# Patient Record
Sex: Male | Born: 1945 | Race: White | Hispanic: No | Marital: Married | Smoking: Former smoker
Health system: Southern US, Community
[De-identification: ages and names within clinical notes are randomized; demographics above are authoritative.]

## PROBLEM LIST (undated history)

## (undated) DIAGNOSIS — M1990 Unspecified osteoarthritis, unspecified site: Secondary | ICD-10-CM

## (undated) DIAGNOSIS — Z86718 Personal history of other venous thrombosis and embolism: Secondary | ICD-10-CM

## (undated) DIAGNOSIS — I10 Essential (primary) hypertension: Secondary | ICD-10-CM

## (undated) DIAGNOSIS — Z923 Personal history of irradiation: Secondary | ICD-10-CM

## (undated) DIAGNOSIS — I714 Abdominal aortic aneurysm, without rupture, unspecified: Secondary | ICD-10-CM

## (undated) DIAGNOSIS — C3490 Malignant neoplasm of unspecified part of unspecified bronchus or lung: Secondary | ICD-10-CM

## (undated) DIAGNOSIS — D701 Agranulocytosis secondary to cancer chemotherapy: Secondary | ICD-10-CM

## (undated) DIAGNOSIS — T451X5A Adverse effect of antineoplastic and immunosuppressive drugs, initial encounter: Secondary | ICD-10-CM

## (undated) DIAGNOSIS — R5382 Chronic fatigue, unspecified: Secondary | ICD-10-CM

## (undated) DIAGNOSIS — Z5111 Encounter for antineoplastic chemotherapy: Secondary | ICD-10-CM

## (undated) DIAGNOSIS — E785 Hyperlipidemia, unspecified: Secondary | ICD-10-CM

## (undated) DIAGNOSIS — I2699 Other pulmonary embolism without acute cor pulmonale: Secondary | ICD-10-CM

## (undated) DIAGNOSIS — I313 Pericardial effusion (noninflammatory): Secondary | ICD-10-CM

## (undated) HISTORY — DX: Abdominal aortic aneurysm, without rupture: I71.4

## (undated) HISTORY — DX: Malignant neoplasm of unspecified part of unspecified bronchus or lung: C34.90

## (undated) HISTORY — DX: Hyperlipidemia, unspecified: E78.5

## (undated) HISTORY — DX: Personal history of irradiation: Z92.3

## (undated) HISTORY — PX: ANTERIOR FUSION CERVICAL SPINE: SUR626

## (undated) HISTORY — DX: Agranulocytosis secondary to cancer chemotherapy: D70.1

## (undated) HISTORY — PX: COLONOSCOPY: SHX174

## (undated) HISTORY — DX: Abdominal aortic aneurysm, without rupture, unspecified: I71.40

## (undated) HISTORY — DX: Adverse effect of antineoplastic and immunosuppressive drugs, initial encounter: T45.1X5A

## (undated) HISTORY — DX: Encounter for antineoplastic chemotherapy: Z51.11

## (undated) HISTORY — PX: JOINT REPLACEMENT: SHX530

## (undated) HISTORY — DX: Pericardial effusion (noninflammatory): I31.3

## (undated) HISTORY — DX: Chronic fatigue, unspecified: R53.82

## (undated) HISTORY — DX: Essential (primary) hypertension: I10

## (undated) SURGERY — Surgical Case
Anesthesia: *Unknown

---

## 1976-11-30 HISTORY — PX: APPENDECTOMY: SHX54

## 1990-11-30 HISTORY — PX: INGUINAL HERNIA REPAIR: SUR1180

## 2005-05-28 ENCOUNTER — Ambulatory Visit (HOSPITAL_COMMUNITY): Admission: RE | Admit: 2005-05-28 | Discharge: 2005-05-29 | Payer: Self-pay | Admitting: Neurological Surgery

## 2005-07-03 ENCOUNTER — Encounter: Admission: RE | Admit: 2005-07-03 | Discharge: 2005-07-03 | Payer: Self-pay | Admitting: Neurological Surgery

## 2008-12-31 ENCOUNTER — Emergency Department (HOSPITAL_COMMUNITY): Admission: EM | Admit: 2008-12-31 | Discharge: 2008-12-31 | Payer: Self-pay | Admitting: Emergency Medicine

## 2008-12-31 HISTORY — PX: HEMIARTHROPLASTY SHOULDER FRACTURE: SUR653

## 2009-01-07 ENCOUNTER — Ambulatory Visit (HOSPITAL_COMMUNITY): Admission: RE | Admit: 2009-01-07 | Discharge: 2009-01-08 | Payer: Self-pay | Admitting: Orthopedic Surgery

## 2011-03-17 LAB — TYPE AND SCREEN: ABO/RH(D): O POS

## 2011-03-17 LAB — URINALYSIS, ROUTINE W REFLEX MICROSCOPIC
Hgb urine dipstick: NEGATIVE
Ketones, ur: NEGATIVE mg/dL
Nitrite: NEGATIVE
Specific Gravity, Urine: 1.016 (ref 1.005–1.030)
Urobilinogen, UA: 1 mg/dL (ref 0.0–1.0)

## 2011-03-17 LAB — DIFFERENTIAL
Basophils Absolute: 0 10*3/uL (ref 0.0–0.1)
Basophils Relative: 0 % (ref 0–1)
Eosinophils Absolute: 0.1 10*3/uL (ref 0.0–0.7)
Neutro Abs: 7.2 10*3/uL (ref 1.7–7.7)
Neutrophils Relative %: 69 % (ref 43–77)

## 2011-03-17 LAB — PROTIME-INR: INR: 1 (ref 0.00–1.49)

## 2011-03-17 LAB — BASIC METABOLIC PANEL
BUN: 11 mg/dL (ref 6–23)
CO2: 28 mEq/L (ref 19–32)
Calcium: 8.7 mg/dL (ref 8.4–10.5)
Chloride: 94 mEq/L — ABNORMAL LOW (ref 96–112)
Creatinine, Ser: 0.97 mg/dL (ref 0.4–1.5)
GFR calc Af Amer: >60 mL/min (ref >60)
Glucose, Bld: 108 mg/dL — ABNORMAL HIGH (ref 70–99)
Glucose, Bld: 119 mg/dL — ABNORMAL HIGH (ref 70–99)
Potassium: 4.5 mEq/L (ref 3.5–5.1)
Sodium: 129 mEq/L — ABNORMAL LOW (ref 135–145)
Sodium: 131 mEq/L — ABNORMAL LOW (ref 135–145)

## 2011-03-17 LAB — CBC
Hemoglobin: 13.1 g/dL (ref 13.0–17.0)
MCV: 96 fL (ref 78.0–100.0)
RBC: 4.37 MIL/uL (ref 4.22–5.81)
RDW: 12.6 % (ref 11.5–15.5)
WBC: 10.5 10*3/uL (ref 4.0–10.5)
WBC: 12.6 10*3/uL — ABNORMAL HIGH (ref 4.0–10.5)

## 2011-03-17 LAB — ABO/RH: ABO/RH(D): O POS

## 2011-03-17 LAB — APTT: aPTT: 30 seconds (ref 24–37)

## 2011-04-14 NOTE — Op Note (Signed)
NAMEJAQUAVEON, BILAL NO.:  [id number]   MEDICAL RECORD NO.:  [id number]          PATIENT TYPE:  INP   LOCATION:  5004                         FACILITY:  [facility]   PHYSICIAN:  Almedia Balls. Ranell Patrick, M.D. DATE OF BIRTH:  1946-06-24   DATE OF PROCEDURE:  01/07/2009  DATE OF DISCHARGE:                               OPERATIVE REPORT   PREOPERATIVE DIAGNOSIS:  Left shoulder displaced three-part proximal  humerus fracture.   POSTOPERATIVE DIAGNOSIS:  Left shoulder displaced three-part proximal  humerus fracture.   PROCEDURE PERFORMED:  Left shoulder open reduction and internal fixation  of proximal humerus fracture utilizing DePuy nail plate (SNP).   SURGEON:  Almedia Balls. Ranell Patrick, MD   ASSISTANT:  Donnie Coffin. Dixon, PA-C   ANESTHESIA:  General anesthesia plus interscalene block anesthesia was  used.   ESTIMATED BLOOD LOSS:  Minimal.   FLUID REPLACEMENT:  1200 mL of crystalloid.   COUNTS:  Correct.   COMPLICATIONS:  None.   ANTIBIOTICS:  Perioperative antibiotics were given.   INDICATIONS:  The patient is a 65 year old male status post a fall off a  ladder while painting his house.  The patient complained of immediate  shoulder pain and inability to elevated his arm.  Radiographs  demonstrating a displaced three-part proximal humerus fracture with  significant malalignment of the femoral head and severe displacement of  the greater tuberosity.  Having counseled the patient regarding the need  operative reduction of the fractured shoulder, the patient consented to  surgery with a fall back plan being a shoulder hemiarthroplasty for more  comminution or instability of the proximal humerus ORIF.  Informed  consent was obtained.   DESCRIPTION OF PROCEDURE:  After an adequate level of anesthesia was  achieved, the patient was positioned in a modified beach-chair position.  Left shoulder was sterilely prepped and draped in the usual manner.  A  deltopectoral approach was  utilized to approach the fractured shoulder  starting at  the coracoid process and extending down to the anterior  humeral shaft.  Dissection down through subcutaneous tissues using  Bovie.  The deltoid was taken laterally with cephalic vein.  The  pectoralis was taken medially after a centimeter of pectoralis was  released.  The conjoined tendon taken medially.  Fractured shoulder was  identified.  We first identified the greater tuberosity which was  significantly displaced posteriorly and superiorly.  We were able to  identify that and grab it with a couple of Kochers and keep it  altogether as a single greater tuberosity with attachment of the rotator  cuff.  We placed a total of three #2 FiberWire sutures in a mattress  fashion into the rotator cuff just lateral to the greater tuberosity and  we had excellent purchase on that tendon which was intact and then we  were able to reduce this into the fractured shoulder.  The patient's  humeral head was significantly valgus impacted.  We were able to reduce  that and then placed a DePuy SNP nail plate intramedullary involved in  the reduction and placed a guide pin centered on the humeral  head.  We  then placed 5 smooth pins which were locking pins into the plate.  These  were appropriately spaced in position on the humeral head, giving an  excellent reduction.  At this point with these 5 pins locked into place  and confirmed on both AP and lateral views using the fluoroscopy, we  then went ahead and fixed the nail plate to the shaft using 3  unicortical screws.  Those were engaged to the pin nicely and gave an  excellent stability to the construct.  We took the shoulder through a  range of motion and there was no relative motion between the fractured  fragments.  We then brought the rotator cuff and greater tuberosity back  into position and utilized the suture holes in the plate for 2 of the 3  mattress sutures and we brought those down  holding the rotator cuff and  greater tuberosity position.  We took the third most posterior sutures  and brought those around from west to [place] and sutured those to the  subscapularis, further stabilizing the lesser tuberosity and the greater  tuberosity together.  This gave a nice anatomic reconstruction of the  shoulder.  We took the shoulder through a full range of motion, no  relative motion was noted between the greater tuberosity, lesser  tuberosity, or the humeral head.  Everything was removed together as a  unit.  We thoroughly irrigated and made sure that there were no  adhesions or captured bursal tissue that would restrict range of motion  and none was encountered.  The CA ligament was preserved.  We then  closed the deltopectoral interval with 0 Vicryl suture followed by 2-0  Vicryl subcutaneous closure and staples for skin.  Sterile compressive  bandage was applied followed by a shoulder sling and abduction pillow to  relax that greater tuberosity repair.  Will be starting on early Codman  exercises and active wrist and elbow range of motion with some general  ADLs.  We will plan on discharging him potentially tomorrow and seeing  him back in the office in about 7-10 days.      Almedia Balls. Ranell Patrick, M.D.  Electronically Signed     SRN/MEDQ  D:  01/07/2009  T:  01/08/2009  Job:  [id number]   cc:   Windy Fast A. Darrelyn Hillock, M.D.

## 2011-04-17 NOTE — Op Note (Signed)
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   PROCEDURES:  1.  Decompressive anterior cervical diskectomy at C5-6.  2.  Anterior cervical arthrodesis at C5-6 utilizing an 8 mm VG2 allograft.  3.  Anterior cervical plating at C5-6 utilizing a 24 mm Accufix plate.   SURGEON:  Tia Alert, M.D.   ASSISTANT:  Altamease Oiler, M.D.   ANESTHESIA:  General endotracheal.   COMPLICATIONS:  None apparent.   INDICATIONS FOR THE PROCEDURE:  Nicholas Hale is a 65 year old white male who  was referred with neck pain and left arm pain. He had an MRI which showed  significant spondylosis at C5-6 with a midline and paracentral to the left  disk herniation. I recommended anterior cervical diskectomy, fusion and  plating at C5-6. He understood the risks, benefits and expected outcome and  wished to proceed.   DESCRIPTION OF THE PROCEDURE:  The patient was taken to operating room.  After induction of adequate general endotracheal anesthesia, he was placed  in the supine position on the operating room table. His right anterior  cervical region was prepped with DuraPrep and then draped in the usual  sterile fashion. Then 5 mL of local anesthesia were injected. Then an  incision was made to the right of midline and carried down to the platysma.  The platysma muscle was elevated, opened and undermined with the Metzenbaum  scissors. I then dissected in a plane medial to the sternocleidomastoid  muscle and internal carotid artery and lateral to the trachea  and esophagus  to expose the anterior cervical spine at C5-6. Intraoperative fluoroscopy  confirmed my level. Then the longus colli muscles were taken down and shadow  line retractors were placed. The disk space was osteophytic. The anterior  osteophyte over the disk was removed with a pituitary rongeur and then the  disk space was cut with a 15 blade scalpel. Then the initial diskectomy was  done with pituitary rongeurs and curved curettes. I then used the high-speed  drill to drill away the anterior osteophytes to prepare for later plating  and to drill the endplates down to the level of the posterior longitudinal  ligament. The operating microscope was brought into the field. The posterior  longitudinal ligament was opened with a nerve hook and then removed in a  circumferential fashion along with the posterior osteophytes at the inferior  edge of C5 and the superior edge of C6 with the Kerrison punch. Bilateral  foraminotomies were performed. The C6 nerve roots were identified,  especially on the left side. I dissected out well past the pedicle level  over the C6 nerve root on the left side and then felt into the pedicle with  a nerve hook to assure adequate compression. We then inspected the upper and  lower endplates to assure adequate decompression of the posterior  osteophytes and then checked this with a nerve hook also. The cord could be  seen pulsatile through the dura and the dura looked well decompressed. I  then irrigated with saline solution and dried all bleeding points. I then  measured the interspace to be 8 mm and used an 8 mm VG2 allograft. I placed  this into position at C5-6. I then used a 24 mm Accufix plate and placed two  13 mm variable angle screws in the body of C5 and the body of C6. These were  locked into position with the locking mechanism on the plate. The wound was  then irrigated. The shadow line retractors were removed. All bleeding points  were coagulated  with the bipolar cautery. Once meticulous hemostasis was  achieved, the platysma was closed with 3-0 Vicryl, the subcuticular tissues  were closed with 3-0 Vicryl and the skin was closed with Dermabond. The  drapes were removed. The patient was awakened from anesthesia and  transported to the recovery room in stable condition. At the end of  procedure, all sponge, needle and instrument counts were correct.       DSJ/MEDQ  D:  05/28/2005  T:  05/28/2005  Job:  [id number]

## 2012-01-29 DIAGNOSIS — H6980 Other specified disorders of Eustachian tube, unspecified ear: Secondary | ICD-10-CM | POA: Diagnosis not present

## 2012-03-18 DIAGNOSIS — I714 Abdominal aortic aneurysm, without rupture: Secondary | ICD-10-CM | POA: Diagnosis not present

## 2012-08-22 DIAGNOSIS — H52209 Unspecified astigmatism, unspecified eye: Secondary | ICD-10-CM | POA: Diagnosis not present

## 2012-08-22 DIAGNOSIS — H2510 Age-related nuclear cataract, unspecified eye: Secondary | ICD-10-CM | POA: Diagnosis not present

## 2012-08-22 DIAGNOSIS — H524 Presbyopia: Secondary | ICD-10-CM | POA: Diagnosis not present

## 2012-11-04 DIAGNOSIS — K1321 Leukoplakia of oral mucosa, including tongue: Secondary | ICD-10-CM | POA: Diagnosis not present

## 2013-01-18 DIAGNOSIS — E7800 Pure hypercholesterolemia, unspecified: Secondary | ICD-10-CM | POA: Diagnosis not present

## 2013-01-18 DIAGNOSIS — Z1211 Encounter for screening for malignant neoplasm of colon: Secondary | ICD-10-CM | POA: Diagnosis not present

## 2013-01-18 DIAGNOSIS — I714 Abdominal aortic aneurysm, without rupture: Secondary | ICD-10-CM | POA: Diagnosis not present

## 2013-01-18 DIAGNOSIS — I10 Essential (primary) hypertension: Secondary | ICD-10-CM | POA: Diagnosis not present

## 2013-01-19 ENCOUNTER — Other Ambulatory Visit: Payer: Self-pay | Admitting: Family Medicine

## 2013-02-27 ENCOUNTER — Other Ambulatory Visit: Payer: Self-pay | Admitting: Family Medicine

## 2013-02-27 DIAGNOSIS — Z09 Encounter for follow-up examination after completed treatment for conditions other than malignant neoplasm: Secondary | ICD-10-CM | POA: Diagnosis not present

## 2013-02-27 DIAGNOSIS — D126 Benign neoplasm of colon, unspecified: Secondary | ICD-10-CM | POA: Diagnosis not present

## 2013-02-27 DIAGNOSIS — I714 Abdominal aortic aneurysm, without rupture: Secondary | ICD-10-CM

## 2013-02-27 DIAGNOSIS — K5730 Diverticulosis of large intestine without perforation or abscess without bleeding: Secondary | ICD-10-CM | POA: Diagnosis not present

## 2013-02-27 DIAGNOSIS — K648 Other hemorrhoids: Secondary | ICD-10-CM | POA: Diagnosis not present

## 2013-02-27 DIAGNOSIS — Z86010 Personal history of colonic polyps: Secondary | ICD-10-CM | POA: Diagnosis not present

## 2013-03-31 DIAGNOSIS — I714 Abdominal aortic aneurysm, without rupture: Secondary | ICD-10-CM | POA: Diagnosis not present

## 2013-07-03 DIAGNOSIS — E7800 Pure hypercholesterolemia, unspecified: Secondary | ICD-10-CM | POA: Diagnosis not present

## 2013-07-03 DIAGNOSIS — I10 Essential (primary) hypertension: Secondary | ICD-10-CM | POA: Diagnosis not present

## 2014-01-03 DIAGNOSIS — Z0000 Encounter for general adult medical examination without abnormal findings: Secondary | ICD-10-CM | POA: Diagnosis not present

## 2014-01-03 DIAGNOSIS — Z125 Encounter for screening for malignant neoplasm of prostate: Secondary | ICD-10-CM | POA: Diagnosis not present

## 2014-01-03 DIAGNOSIS — I10 Essential (primary) hypertension: Secondary | ICD-10-CM | POA: Diagnosis not present

## 2014-01-03 DIAGNOSIS — E7800 Pure hypercholesterolemia, unspecified: Secondary | ICD-10-CM | POA: Diagnosis not present

## 2014-01-10 DIAGNOSIS — H9190 Unspecified hearing loss, unspecified ear: Secondary | ICD-10-CM | POA: Diagnosis not present

## 2014-01-10 DIAGNOSIS — H6120 Impacted cerumen, unspecified ear: Secondary | ICD-10-CM | POA: Diagnosis not present

## 2014-02-05 DIAGNOSIS — H2510 Age-related nuclear cataract, unspecified eye: Secondary | ICD-10-CM | POA: Diagnosis not present

## 2014-02-05 DIAGNOSIS — H52209 Unspecified astigmatism, unspecified eye: Secondary | ICD-10-CM | POA: Diagnosis not present

## 2014-03-20 DIAGNOSIS — M25559 Pain in unspecified hip: Secondary | ICD-10-CM | POA: Diagnosis not present

## 2014-03-20 DIAGNOSIS — I714 Abdominal aortic aneurysm, without rupture, unspecified: Secondary | ICD-10-CM | POA: Diagnosis not present

## 2014-03-21 ENCOUNTER — Other Ambulatory Visit: Payer: Self-pay | Admitting: Family Medicine

## 2014-03-21 ENCOUNTER — Ambulatory Visit
Admission: RE | Admit: 2014-03-21 | Discharge: 2014-03-21 | Disposition: A | Discharge status: Home / Self Care | Payer: Medicare Other | Source: Ambulatory Visit | Attending: Family Medicine | Admitting: Family Medicine

## 2014-03-21 DIAGNOSIS — R1030 Lower abdominal pain, unspecified: Secondary | ICD-10-CM

## 2014-03-21 DIAGNOSIS — M169 Osteoarthritis of hip, unspecified: Secondary | ICD-10-CM | POA: Diagnosis not present

## 2014-03-21 DIAGNOSIS — I714 Abdominal aortic aneurysm, without rupture, unspecified: Secondary | ICD-10-CM

## 2014-03-21 DIAGNOSIS — M1610 Unilateral primary osteoarthritis, unspecified hip: Secondary | ICD-10-CM | POA: Diagnosis not present

## 2014-03-26 ENCOUNTER — Encounter (INDEPENDENT_AMBULATORY_CARE_PROVIDER_SITE_OTHER): Payer: Self-pay

## 2014-03-26 ENCOUNTER — Ambulatory Visit
Admission: RE | Admit: 2014-03-26 | Discharge: 2014-03-26 | Disposition: A | Discharge status: Home / Self Care | Payer: Medicare Other | Source: Ambulatory Visit | Attending: Family Medicine | Admitting: Family Medicine

## 2014-03-26 DIAGNOSIS — I714 Abdominal aortic aneurysm, without rupture, unspecified: Secondary | ICD-10-CM | POA: Diagnosis not present

## 2014-03-27 DIAGNOSIS — M1610 Unilateral primary osteoarthritis, unspecified hip: Secondary | ICD-10-CM | POA: Diagnosis not present

## 2014-03-27 DIAGNOSIS — M169 Osteoarthritis of hip, unspecified: Secondary | ICD-10-CM | POA: Diagnosis not present

## 2014-04-13 ENCOUNTER — Other Ambulatory Visit: Payer: Self-pay | Admitting: Family Medicine

## 2014-04-13 DIAGNOSIS — Q619 Cystic kidney disease, unspecified: Secondary | ICD-10-CM

## 2014-04-26 DIAGNOSIS — M169 Osteoarthritis of hip, unspecified: Secondary | ICD-10-CM | POA: Diagnosis not present

## 2014-04-26 DIAGNOSIS — M1610 Unilateral primary osteoarthritis, unspecified hip: Secondary | ICD-10-CM | POA: Diagnosis not present

## 2014-05-02 ENCOUNTER — Other Ambulatory Visit: Payer: Self-pay | Admitting: Orthopedic Surgery

## 2014-05-15 ENCOUNTER — Encounter (HOSPITAL_COMMUNITY): Payer: Self-pay | Admitting: Pharmacy Technician

## 2014-05-15 DIAGNOSIS — I10 Essential (primary) hypertension: Secondary | ICD-10-CM | POA: Insufficient documentation

## 2014-05-15 DIAGNOSIS — E785 Hyperlipidemia, unspecified: Secondary | ICD-10-CM | POA: Insufficient documentation

## 2014-05-16 NOTE — Pre-Procedure Instructions (Signed)
KAMAU WEATHERALL  05/16/2014   Your procedure is scheduled on: Wednesday, May 23, 2014  Report to [facility] Short Stay (use Main Entrance "A'') at 10:45 AM.  Call this number if you have problems the morning of surgery: [phone]   Remember:   Do not eat food or drink liquids after midnight Tuesday, May 22, 2014   Take these medicines the morning of surgery with A SIP OF WATER: if needed: traMADol Veatrice [name]) for pain OR oxyCODONE (PERCOCET/ROXICET) for severe pain Stop taking Aspirin, Meloxicam, vitamins and herbal medications. Do not take any NSAIDs ie: Ibuprofen, Motrin, Advil, Naproxen, Aleve or any medication containing Aspirin.Stop 5 days prior to procedure ( Fri. 6/19)  Do not wear jewelry, make-up or nail polish.  Do not wear lotions, powders, or perfumes. You may wear deodorant.  Do not shave 48 hours prior to surgery. Men may shave face and neck.  Do not bring valuables to the hospital.  [facility] is not responsible for any belongings or valuables.               Contacts, dentures or bridgework may not be worn into surgery.  Leave suitcase in the car. After surgery it may be brought to your room.  For patients admitted to the hospital, discharge time is determined by your treatment team.               Patients discharged the day of surgery will not be allowed to drive home.  Name and phone number of your driver:   Special Instructions:  Special Instructions:Special Instructions: [facility] - Preparing for Surgery  Before surgery, you can play an important role.  Because skin is not sterile, your skin needs to be as free of germs as possible.  You can reduce the number of germs on you skin by washing with CHG (chlorahexidine gluconate) soap before surgery.  CHG is an antiseptic cleaner which kills germs and bonds with the skin to continue killing germs even after washing.  Please DO NOT use if you have an allergy to CHG or antibacterial soaps.  If your skin becomes  reddened/irritated stop using the CHG and inform your nurse when you arrive at Short Stay.  Do not shave (including legs and underarms) for at least 48 hours prior to the first CHG shower.  You may shave your face.  Please follow these instructions carefully:   1.  Shower with CHG Soap the night before surgery and the morning of Surgery.  2.  If you choose to wash your hair, wash your hair first as usual with your normal shampoo.  3.  After you shampoo, rinse your hair and body thoroughly to remove the Shampoo.  4.  Use CHG as you would any other liquid soap.  You can apply chg directly  to the skin and wash gently with scrungie or a clean washcloth.  5.  Apply the CHG Soap to your body ONLY FROM THE NECK DOWN.  Do not use on open wounds or open sores.  Avoid contact with your eyes, ears, mouth and genitals (private parts).  Wash genitals (private parts) with your normal soap.  6.  Wash thoroughly, paying special attention to the area where your surgery will be performed.  7.  Thoroughly rinse your body with warm water from the neck down.  8.  DO NOT shower/wash with your normal soap after using and rinsing off the CHG Soap.  9.  Pat yourself dry with a clean towel.  10.  Wear clean pajamas.            11.  Place clean sheets on your bed the night of your first shower and do not sleep with pets.  Day of Surgery  Do not apply any lotions the morning of surgery.  Please wear clean clothes to the hospital/surgery center.   Please read over the following fact sheets that you were given: Pain Booklet, Coughing and Deep Breathing, Blood Transfusion Information, Total Joint Packet, MRSA Information and Surgical Site Infection Prevention

## 2014-05-17 ENCOUNTER — Encounter (HOSPITAL_COMMUNITY): Payer: Self-pay

## 2014-05-17 ENCOUNTER — Encounter (HOSPITAL_COMMUNITY)
Admission: RE | Admit: 2014-05-17 | Discharge: 2014-05-17 | Disposition: A | Discharge status: Home / Self Care | Payer: Medicare Other | Source: Ambulatory Visit | Attending: Orthopedic Surgery | Admitting: Orthopedic Surgery

## 2014-05-17 DIAGNOSIS — Z01818 Encounter for other preprocedural examination: Secondary | ICD-10-CM | POA: Insufficient documentation

## 2014-05-17 DIAGNOSIS — Z01810 Encounter for preprocedural cardiovascular examination: Secondary | ICD-10-CM | POA: Diagnosis not present

## 2014-05-17 DIAGNOSIS — Z01812 Encounter for preprocedural laboratory examination: Secondary | ICD-10-CM | POA: Diagnosis not present

## 2014-05-17 DIAGNOSIS — I10 Essential (primary) hypertension: Secondary | ICD-10-CM | POA: Diagnosis not present

## 2014-05-17 HISTORY — DX: Unspecified osteoarthritis, unspecified site: M19.90

## 2014-05-17 LAB — URINALYSIS, ROUTINE W REFLEX MICROSCOPIC
Bilirubin Urine: NEGATIVE
Glucose, UA: NEGATIVE mg/dL
KETONES UR: NEGATIVE mg/dL
LEUKOCYTES UA: NEGATIVE
Nitrite: NEGATIVE
PH: 5 (ref 5.0–8.0)
PROTEIN: NEGATIVE mg/dL
Specific Gravity, Urine: 1.014 (ref 1.005–1.030)
Urobilinogen, UA: 0.2 mg/dL (ref 0.0–1.0)

## 2014-05-17 LAB — BASIC METABOLIC PANEL
BUN: 18 mg/dL (ref 6–23)
CO2: 25 meq/L (ref 19–32)
Calcium: 9.5 mg/dL (ref 8.4–10.5)
Chloride: 100 mEq/L (ref 96–112)
Creatinine, Ser: 0.99 mg/dL (ref 0.50–1.35)
GFR calc Af Amer: >90 mL/min (ref >90)
GFR, EST NON AFRICAN AMERICAN: 82 mL/min — AB (ref >90)
GLUCOSE: 102 mg/dL — AB (ref 70–99)
POTASSIUM: 4.5 meq/L (ref 3.7–5.3)
Sodium: 138 mEq/L (ref 137–147)

## 2014-05-17 LAB — CBC WITH DIFFERENTIAL/PLATELET
Basophils Absolute: 0.1 10*3/uL (ref 0.0–0.1)
Basophils Relative: 1 % (ref 0–1)
EOS ABS: 0.3 10*3/uL (ref 0.0–0.7)
Eosinophils Relative: 3 % (ref 0–5)
HEMATOCRIT: 47.4 % (ref 39.0–52.0)
Hemoglobin: 16.1 g/dL (ref 13.0–17.0)
LYMPHS ABS: 2.1 10*3/uL (ref 0.7–4.0)
Lymphocytes Relative: 25 % (ref 12–46)
MCH: 33 pg (ref 26.0–34.0)
MCHC: 34 g/dL (ref 30.0–36.0)
MCV: 97.1 fL (ref 78.0–100.0)
MONOS PCT: 7 % (ref 3–12)
Monocytes Absolute: 0.6 10*3/uL (ref 0.1–1.0)
NEUTROS PCT: 64 % (ref 43–77)
Neutro Abs: 5.4 10*3/uL (ref 1.7–7.7)
Platelets: 209 10*3/uL (ref 150–400)
RBC: 4.88 MIL/uL (ref 4.22–5.81)
RDW: 12.8 % (ref 11.5–15.5)
WBC: 8.4 10*3/uL (ref 4.0–10.5)

## 2014-05-17 LAB — SURGICAL PCR SCREEN
MRSA, PCR: NEGATIVE
Staphylococcus aureus: NEGATIVE

## 2014-05-17 LAB — URINE MICROSCOPIC-ADD ON

## 2014-05-17 LAB — PROTIME-INR
INR: 0.95 (ref 0.00–1.49)
Prothrombin Time: 12.5 seconds (ref 11.6–15.2)

## 2014-05-17 LAB — APTT: aPTT: 32 seconds (ref 24–37)

## 2014-05-17 LAB — TYPE AND SCREEN
ABO/RH(D): O POS
Antibody Screen: NEGATIVE

## 2014-05-17 MED ORDER — CHLORHEXIDINE GLUCONATE 4 % EX LIQD: 60.0000 mL | Freq: Once | CUTANEOUS | Status: DC

## 2014-05-22 NOTE — H&P (Signed)
TOTAL HIP ADMISSION H&P  Patient is admitted for left total hip arthroplasty.  Subjective:  Chief Complaint: left hip pain  HPI: Nicholas Hale, 68 y.o. male, has a history of pain and functional disability in the left hip(s) due to arthritis and patient has failed non-surgical conservative treatments for greater than 12 weeks to include NSAID's and/or analgesics, flexibility and strengthening excercises, use of assistive devices, weight reduction as appropriate and activity modification.  Onset of symptoms was abrupt starting 1 years ago with rapidlly worsening course since that time.The patient noted no past surgery on the left hip(s).  Patient currently rates pain in the left hip at 10 out of 10 with activity. Patient has night pain, worsening of pain with activity and weight bearing, trendelenberg gait, pain that interfers with activities of daily living and pain with passive range of motion. Patient has evidence of periarticular osteophytes and joint space narrowing by imaging studies. This condition presents safety issues increasing the risk of falls.  There is no current active infection.  Patient Active Problem List   Diagnosis Date Noted  . Hypertension 05/15/2014  . Hyperlipidemia 05/15/2014   Past Medical History  Diagnosis Date  . Hypertension   . Hyperlipidemia   . Abdominal aortic aneurysm   . Chronic kidney disease     cyst on kidney right side, will have [place] in september  . Arthritis     Past Surgical History  Procedure Laterality Date  . Left shoulder surgery  12/2008  . Appendectomy  1978  . Bilateral inguinal hernia urgery  1992  . Anterior fusion cervical spine    . Colonoscopy    . Hernia repair      No prescriptions prior to admission   No Known Allergies  History  Substance Use Topics  . Smoking status: Former Smoker -- 0.50 packs/day for 40 years  . Smokeless tobacco: Never Used  . Alcohol Use: No    Family History  Problem Relation Age of Onset  .  Clotting disorder Mother   . Diabetes Mellitus I Mother   . Prostate cancer Father      Review of Systems  Constitutional: Negative.   HENT: Negative.   Eyes: Negative.        Glasses  Respiratory: Negative.   Cardiovascular: Negative.   Gastrointestinal: Negative.   Genitourinary: Negative.   Musculoskeletal: Positive for joint pain.  Skin: Negative.   Neurological: Negative.   Endo/Heme/Allergies: Negative.   Psychiatric/Behavioral: Negative.     Objective:  Physical Exam  Constitutional: He is oriented to person, place, and time. He appears well-developed and well-nourished.  HENT:  Head: Normocephalic and atraumatic.  Eyes: Conjunctivae are normal. Pupils are equal, round, and reactive to light.  Neck: Normal range of motion. Neck supple.  Cardiovascular: Intact distal pulses.   Respiratory: Effort normal.  Musculoskeletal: He exhibits tenderness.  The patient walks with a left greater than right antalgic gait, internal rotation left hip causes significant pain and blocks at about 10.    Neurological: He is alert and oriented to person, place, and time.  Skin: Skin is warm and dry.  Psychiatric: He has a normal mood and affect. His behavior is normal. Judgment and thought content normal.    Vital signs in last 24 hours:    Labs:   There is no height or weight on file to calculate BMI.   Imaging Review X-rays reviewed on the Internet portal show moderate to severe arthritis right greater than left hip,  loss of articular cartilage and very impressive peripheral osteophytes superiorly and fairly large drop osteophytes.  Assessment/Plan:  End stage arthritis, left hip(s)  The patient history, physical examination, clinical judgement of the provider and imaging studies are consistent with end stage degenerative joint disease of the left hip(s) and total hip arthroplasty is deemed medically necessary. The treatment options including medical management, injection  therapy, arthroscopy and arthroplasty were discussed at length. The risks and benefits of total hip arthroplasty were presented and reviewed. The risks due to aseptic loosening, infection, stiffness, dislocation/subluxation,  thromboembolic complications and other imponderables were discussed.  The patient acknowledged the explanation, agreed to proceed with the plan and consent was signed. Patient is being admitted for inpatient treatment for surgery, pain control, PT, OT, prophylactic antibiotics, VTE prophylaxis, progressive ambulation and ADL's and discharge planning.The patient is planning to be discharged home with home health services

## 2014-05-23 ENCOUNTER — Inpatient Hospital Stay (HOSPITAL_COMMUNITY): Payer: Medicare Other

## 2014-05-23 ENCOUNTER — Inpatient Hospital Stay (HOSPITAL_COMMUNITY)
Admission: RE | Admit: 2014-05-23 | Discharge: 2014-05-25 | DRG: 470 | Disposition: A | Discharge status: Home Health | Payer: Medicare Other | Source: Ambulatory Visit | Attending: Orthopedic Surgery | Admitting: Orthopedic Surgery

## 2014-05-23 ENCOUNTER — Encounter (HOSPITAL_COMMUNITY)
Admission: RE | Disposition: A | Discharge status: Home Health | Payer: Self-pay | Source: Ambulatory Visit | Attending: Orthopedic Surgery

## 2014-05-23 ENCOUNTER — Encounter (HOSPITAL_COMMUNITY): Payer: Medicare Other | Admitting: Anesthesiology

## 2014-05-23 ENCOUNTER — Encounter (HOSPITAL_COMMUNITY): Payer: Self-pay | Admitting: *Deleted

## 2014-05-23 ENCOUNTER — Inpatient Hospital Stay (HOSPITAL_COMMUNITY): Payer: Medicare Other | Admitting: Anesthesiology

## 2014-05-23 DIAGNOSIS — Z7982 Long term (current) use of aspirin: Secondary | ICD-10-CM

## 2014-05-23 DIAGNOSIS — I129 Hypertensive chronic kidney disease with stage 1 through stage 4 chronic kidney disease, or unspecified chronic kidney disease: Secondary | ICD-10-CM | POA: Diagnosis present

## 2014-05-23 DIAGNOSIS — Z87891 Personal history of nicotine dependence: Secondary | ICD-10-CM | POA: Diagnosis not present

## 2014-05-23 DIAGNOSIS — Z79899 Other long term (current) drug therapy: Secondary | ICD-10-CM

## 2014-05-23 DIAGNOSIS — Z96649 Presence of unspecified artificial hip joint: Secondary | ICD-10-CM | POA: Diagnosis not present

## 2014-05-23 DIAGNOSIS — Z8042 Family history of malignant neoplasm of prostate: Secondary | ICD-10-CM | POA: Diagnosis not present

## 2014-05-23 DIAGNOSIS — Z981 Arthrodesis status: Secondary | ICD-10-CM | POA: Diagnosis not present

## 2014-05-23 DIAGNOSIS — Z9089 Acquired absence of other organs: Secondary | ICD-10-CM

## 2014-05-23 DIAGNOSIS — Z471 Aftercare following joint replacement surgery: Secondary | ICD-10-CM | POA: Diagnosis not present

## 2014-05-23 DIAGNOSIS — Z833 Family history of diabetes mellitus: Secondary | ICD-10-CM

## 2014-05-23 DIAGNOSIS — M1612 Unilateral primary osteoarthritis, left hip: Secondary | ICD-10-CM

## 2014-05-23 DIAGNOSIS — E785 Hyperlipidemia, unspecified: Secondary | ICD-10-CM | POA: Diagnosis present

## 2014-05-23 DIAGNOSIS — N189 Chronic kidney disease, unspecified: Secondary | ICD-10-CM | POA: Diagnosis not present

## 2014-05-23 DIAGNOSIS — M1610 Unilateral primary osteoarthritis, unspecified hip: Secondary | ICD-10-CM | POA: Diagnosis not present

## 2014-05-23 DIAGNOSIS — M1990 Unspecified osteoarthritis, unspecified site: Secondary | ICD-10-CM | POA: Diagnosis not present

## 2014-05-23 DIAGNOSIS — M169 Osteoarthritis of hip, unspecified: Secondary | ICD-10-CM | POA: Diagnosis not present

## 2014-05-23 DIAGNOSIS — M25559 Pain in unspecified hip: Secondary | ICD-10-CM | POA: Diagnosis not present

## 2014-05-23 HISTORY — PX: TOTAL HIP ARTHROPLASTY: SHX124

## 2014-05-23 LAB — GLUCOSE, CAPILLARY: Glucose-Capillary: 164 mg/dL — ABNORMAL HIGH (ref 70–99)

## 2014-05-23 SURGERY — ARTHROPLASTY, HIP, TOTAL,POSTERIOR APPROACH
Anesthesia: General | Laterality: Left

## 2014-05-23 MED ORDER — PHENOL 1.4 % MT LIQD: 1.0000 | OROMUCOSAL | Status: DC | PRN

## 2014-05-23 MED ORDER — SODIUM CHLORIDE 0.9 % IR SOLN: Status: DC | PRN

## 2014-05-23 MED ORDER — LACTATED RINGERS IV SOLN
INTRAVENOUS | Status: DC | PRN
  Administered 2014-05-23 (×2): via INTRAVENOUS

## 2014-05-23 MED ORDER — SODIUM CHLORIDE 0.9 % IJ SOLN
INTRAMUSCULAR | Status: AC
  Filled 2014-05-23: qty 10

## 2014-05-23 MED ORDER — LISINOPRIL 20 MG PO TABS
20.0000 mg | ORAL_TABLET | Freq: Every day | ORAL | Status: DC
  Administered 2014-05-24 – 2014-05-25 (×2): 20 mg via ORAL
  Filled 2014-05-23 (×2): qty 1

## 2014-05-23 MED ORDER — LIDOCAINE HCL (CARDIAC) 20 MG/ML IV SOLN
INTRAVENOUS | Status: AC
  Filled 2014-05-23: qty 5

## 2014-05-23 MED ORDER — OXYCODONE HCL 5 MG/5ML PO SOLN: 5.0000 mg | Freq: Once | ORAL | Status: AC | PRN

## 2014-05-23 MED ORDER — NEOSTIGMINE METHYLSULFATE 10 MG/10ML IV SOLN
INTRAVENOUS | Status: DC | PRN
  Administered 2014-05-23: 4 mg via INTRAVENOUS

## 2014-05-23 MED ORDER — MIDAZOLAM HCL 2 MG/2ML IJ SOLN
INTRAMUSCULAR | Status: AC
  Filled 2014-05-23: qty 2

## 2014-05-23 MED ORDER — CEFAZOLIN SODIUM-DEXTROSE 2-3 GM-% IV SOLR
2.0000 g | INTRAVENOUS | Status: AC
  Administered 2014-05-23: 2 g via INTRAVENOUS
  Filled 2014-05-23: qty 50

## 2014-05-23 MED ORDER — DOCUSATE SODIUM 100 MG PO CAPS
100.0000 mg | ORAL_CAPSULE | Freq: Two times a day (BID) | ORAL | Status: DC
  Administered 2014-05-23 – 2014-05-25 (×4): 100 mg via ORAL
  Filled 2014-05-23 (×5): qty 1

## 2014-05-23 MED ORDER — HYDROMORPHONE HCL PF 1 MG/ML IJ SOLN
INTRAMUSCULAR | Status: AC
  Filled 2014-05-23: qty 1

## 2014-05-23 MED ORDER — METHOCARBAMOL 1000 MG/10ML IJ SOLN
500.0000 mg | Freq: Four times a day (QID) | INTRAVENOUS | Status: DC | PRN
  Filled 2014-05-23: qty 5

## 2014-05-23 MED ORDER — PROPOFOL 10 MG/ML IV BOLUS
INTRAVENOUS | Status: AC
  Filled 2014-05-23: qty 20

## 2014-05-23 MED ORDER — ONDANSETRON HCL 4 MG PO TABS: 4.0000 mg | ORAL_TABLET | Freq: Four times a day (QID) | ORAL | Status: DC | PRN

## 2014-05-23 MED ORDER — LISINOPRIL-HYDROCHLOROTHIAZIDE 20-12.5 MG PO TABS: 1.0000 | ORAL_TABLET | Freq: Every day | ORAL | Status: DC

## 2014-05-23 MED ORDER — OXYCODONE-ACETAMINOPHEN 5-325 MG PO TABS: 1.0000 | ORAL_TABLET | ORAL | Status: DC | PRN

## 2014-05-23 MED ORDER — FLEET ENEMA 7-19 GM/118ML RE ENEM: 1.0000 | ENEMA | Freq: Once | RECTAL | Status: AC | PRN

## 2014-05-23 MED ORDER — BUPIVACAINE-EPINEPHRINE (PF) 0.5% -1:200000 IJ SOLN
INTRAMUSCULAR | Status: AC
  Filled 2014-05-23: qty 30

## 2014-05-23 MED ORDER — ACETAMINOPHEN 650 MG RE SUPP: 650.0000 mg | Freq: Four times a day (QID) | RECTAL | Status: DC | PRN

## 2014-05-23 MED ORDER — MIDAZOLAM HCL 5 MG/5ML IJ SOLN
INTRAMUSCULAR | Status: DC | PRN
  Administered 2014-05-23: 2 mg via INTRAVENOUS

## 2014-05-23 MED ORDER — SUFENTANIL CITRATE 50 MCG/ML IV SOLN
INTRAVENOUS | Status: DC | PRN
  Administered 2014-05-23: 10 ug via INTRAVENOUS
  Administered 2014-05-23: 20 ug via INTRAVENOUS
  Administered 2014-05-23: 10 ug via INTRAVENOUS

## 2014-05-23 MED ORDER — 0.9 % SODIUM CHLORIDE (POUR BTL) OPTIME
TOPICAL | Status: DC | PRN
  Administered 2014-05-23: 1000 mL

## 2014-05-23 MED ORDER — HYDROMORPHONE HCL PF 1 MG/ML IJ SOLN
0.5000 mg | INTRAMUSCULAR | Status: DC | PRN
  Administered 2014-05-23 – 2014-05-24 (×2): 1 mg via INTRAVENOUS
  Filled 2014-05-23: qty 1

## 2014-05-23 MED ORDER — DEXTROSE-NACL 5-0.45 % IV SOLN: INTRAVENOUS | Status: DC

## 2014-05-23 MED ORDER — TRANEXAMIC ACID 100 MG/ML IV SOLN
1000.0000 mg | INTRAVENOUS | Status: AC
  Administered 2014-05-23: 1000 mg via INTRAVENOUS
  Filled 2014-05-23: qty 10

## 2014-05-23 MED ORDER — HYDROMORPHONE HCL PF 1 MG/ML IJ SOLN
0.2500 mg | INTRAMUSCULAR | Status: DC | PRN
  Administered 2014-05-23 (×4): 0.5 mg via INTRAVENOUS

## 2014-05-23 MED ORDER — ALUMINUM HYDROXIDE GEL 320 MG/5ML PO SUSP
15.0000 mL | ORAL | Status: DC | PRN
  Filled 2014-05-23: qty 30

## 2014-05-23 MED ORDER — HYDROCHLOROTHIAZIDE 12.5 MG PO CAPS
12.5000 mg | ORAL_CAPSULE | Freq: Every day | ORAL | Status: DC
  Administered 2014-05-24 – 2014-05-25 (×2): 12.5 mg via ORAL
  Filled 2014-05-23 (×2): qty 1

## 2014-05-23 MED ORDER — METOCLOPRAMIDE HCL 10 MG PO TABS: 5.0000 mg | ORAL_TABLET | Freq: Three times a day (TID) | ORAL | Status: DC | PRN

## 2014-05-23 MED ORDER — OXYCODONE HCL 5 MG PO TABS
5.0000 mg | ORAL_TABLET | ORAL | Status: DC | PRN
  Administered 2014-05-23 – 2014-05-24 (×4): 10 mg via ORAL
  Administered 2014-05-24: 5 mg via ORAL
  Administered 2014-05-24 – 2014-05-25 (×4): 10 mg via ORAL
  Filled 2014-05-23 (×9): qty 2

## 2014-05-23 MED ORDER — METHOCARBAMOL 500 MG PO TABS
ORAL_TABLET | ORAL | Status: AC
  Administered 2014-05-24: 500 mg via ORAL
  Filled 2014-05-23: qty 1

## 2014-05-23 MED ORDER — METHOCARBAMOL 500 MG PO TABS: 500.0000 mg | ORAL_TABLET | Freq: Two times a day (BID) | ORAL | Status: DC

## 2014-05-23 MED ORDER — ASPIRIN EC 325 MG PO TBEC: 325.0000 mg | DELAYED_RELEASE_TABLET | Freq: Two times a day (BID) | ORAL | Status: DC

## 2014-05-23 MED ORDER — PROMETHAZINE HCL 25 MG/ML IJ SOLN: 6.2500 mg | INTRAMUSCULAR | Status: DC | PRN

## 2014-05-23 MED ORDER — OXYCODONE HCL 5 MG PO TABS
5.0000 mg | ORAL_TABLET | Freq: Once | ORAL | Status: AC | PRN
  Administered 2014-05-23: 5 mg via ORAL

## 2014-05-23 MED ORDER — MENTHOL 3 MG MT LOZG: 1.0000 | LOZENGE | OROMUCOSAL | Status: DC | PRN

## 2014-05-23 MED ORDER — BUPIVACAINE-EPINEPHRINE 0.5% -1:200000 IJ SOLN
INTRAMUSCULAR | Status: DC | PRN
  Administered 2014-05-23: 10 mL

## 2014-05-23 MED ORDER — KCL IN DEXTROSE-NACL 20-5-0.45 MEQ/L-%-% IV SOLN
INTRAVENOUS | Status: DC
  Filled 2014-05-23 (×7): qty 1000

## 2014-05-23 MED ORDER — ACETAMINOPHEN 325 MG PO TABS: 650.0000 mg | ORAL_TABLET | Freq: Four times a day (QID) | ORAL | Status: DC | PRN

## 2014-05-23 MED ORDER — ONDANSETRON HCL 4 MG/2ML IJ SOLN: 4.0000 mg | Freq: Four times a day (QID) | INTRAMUSCULAR | Status: DC | PRN

## 2014-05-23 MED ORDER — DIPHENHYDRAMINE HCL 12.5 MG/5ML PO ELIX: 12.5000 mg | ORAL_SOLUTION | ORAL | Status: DC | PRN

## 2014-05-23 MED ORDER — KCL IN DEXTROSE-NACL 20-5-0.45 MEQ/L-%-% IV SOLN
INTRAVENOUS | Status: AC
  Administered 2014-05-24: 03:00:00
  Filled 2014-05-23: qty 1000

## 2014-05-23 MED ORDER — NEOSTIGMINE METHYLSULFATE 10 MG/10ML IV SOLN
INTRAVENOUS | Status: AC
  Filled 2014-05-23: qty 1

## 2014-05-23 MED ORDER — SIMVASTATIN 20 MG PO TABS
20.0000 mg | ORAL_TABLET | Freq: Every day | ORAL | Status: DC
  Administered 2014-05-24: 20 mg via ORAL
  Filled 2014-05-23 (×2): qty 1

## 2014-05-23 MED ORDER — METHOCARBAMOL 500 MG PO TABS
500.0000 mg | ORAL_TABLET | Freq: Four times a day (QID) | ORAL | Status: DC | PRN
  Administered 2014-05-23 – 2014-05-25 (×6): 500 mg via ORAL
  Filled 2014-05-23 (×6): qty 1

## 2014-05-23 MED ORDER — ONDANSETRON HCL 4 MG/2ML IJ SOLN
INTRAMUSCULAR | Status: AC
  Filled 2014-05-23: qty 2

## 2014-05-23 MED ORDER — EPHEDRINE SULFATE 50 MG/ML IJ SOLN
INTRAMUSCULAR | Status: AC
  Filled 2014-05-23: qty 1

## 2014-05-23 MED ORDER — ROCURONIUM BROMIDE 100 MG/10ML IV SOLN
INTRAVENOUS | Status: DC | PRN
  Administered 2014-05-23: 50 mg via INTRAVENOUS

## 2014-05-23 MED ORDER — LIDOCAINE HCL (CARDIAC) 20 MG/ML IV SOLN
INTRAVENOUS | Status: DC | PRN
  Administered 2014-05-23: 5 mg via INTRAVENOUS

## 2014-05-23 MED ORDER — GLYCOPYRROLATE 0.2 MG/ML IJ SOLN
INTRAMUSCULAR | Status: AC
  Filled 2014-05-23: qty 3

## 2014-05-23 MED ORDER — BISACODYL 5 MG PO TBEC: 5.0000 mg | DELAYED_RELEASE_TABLET | Freq: Every day | ORAL | Status: DC | PRN

## 2014-05-23 MED ORDER — SENNOSIDES-DOCUSATE SODIUM 8.6-50 MG PO TABS: 1.0000 | ORAL_TABLET | Freq: Every evening | ORAL | Status: DC | PRN

## 2014-05-23 MED ORDER — TRAMADOL HCL 50 MG PO TABS: 50.0000 mg | ORAL_TABLET | Freq: Four times a day (QID) | ORAL | Status: DC | PRN

## 2014-05-23 MED ORDER — OXYCODONE HCL 5 MG PO TABS
ORAL_TABLET | ORAL | Status: AC
  Administered 2014-05-24: 5 mg via ORAL
  Filled 2014-05-23: qty 1

## 2014-05-23 MED ORDER — ONDANSETRON HCL 4 MG/2ML IJ SOLN
INTRAMUSCULAR | Status: DC | PRN
  Administered 2014-05-23: 4 mg via INTRAVENOUS

## 2014-05-23 MED ORDER — ROCURONIUM BROMIDE 50 MG/5ML IV SOLN
INTRAVENOUS | Status: AC
  Filled 2014-05-23: qty 1

## 2014-05-23 MED ORDER — METOCLOPRAMIDE HCL 5 MG/ML IJ SOLN: 5.0000 mg | Freq: Three times a day (TID) | INTRAMUSCULAR | Status: DC | PRN

## 2014-05-23 MED ORDER — LACTATED RINGERS IV SOLN
INTRAVENOUS | Status: DC
  Administered 2014-05-23: 11:00:00 via INTRAVENOUS

## 2014-05-23 MED ORDER — SUFENTANIL CITRATE 50 MCG/ML IV SOLN
INTRAVENOUS | Status: AC
  Filled 2014-05-23: qty 1

## 2014-05-23 MED ORDER — PROPOFOL 10 MG/ML IV BOLUS
INTRAVENOUS | Status: DC | PRN
  Administered 2014-05-23: 200 mg via INTRAVENOUS

## 2014-05-23 MED ORDER — GLYCOPYRROLATE 0.2 MG/ML IJ SOLN
INTRAMUSCULAR | Status: DC | PRN
  Administered 2014-05-23: .7 mg via INTRAVENOUS

## 2014-05-23 MED ORDER — ASPIRIN EC 325 MG PO TBEC
325.0000 mg | DELAYED_RELEASE_TABLET | Freq: Every day | ORAL | Status: DC
  Administered 2014-05-24 – 2014-05-25 (×2): 325 mg via ORAL
  Filled 2014-05-23 (×3): qty 1

## 2014-05-23 SURGICAL SUPPLY — 56 items
BLADE SAW SGTL 18X1.27X75 (BLADE) ×2 IMPLANT
BRUSH FEMORAL CANAL (MISCELLANEOUS) IMPLANT
CAPT HIP PF MOP ×1 IMPLANT
COVER BACK TABLE 24X17X13 BIG (DRAPES) IMPLANT
COVER SURGICAL LIGHT HANDLE (MISCELLANEOUS) ×4 IMPLANT
DRAPE ORTHO SPLIT 77X108 STRL (DRAPES) ×2
DRAPE PROXIMA HALF (DRAPES) ×2 IMPLANT
DRAPE SURG ORHT 6 SPLT 77X108 (DRAPES) ×1 IMPLANT
DRAPE U-SHAPE 47X51 STRL (DRAPES) ×2 IMPLANT
DRILL BIT 7/64X5 (BIT) ×2 IMPLANT
DRSG AQUACEL AG ADV 3.5X10 (GAUZE/BANDAGES/DRESSINGS) ×2 IMPLANT
DURAPREP 26ML APPLICATOR (WOUND CARE) ×2 IMPLANT
ELECT BLADE 4.0 EZ CLEAN MEGAD (MISCELLANEOUS)
ELECT REM PT RETURN 9FT ADLT (ELECTROSURGICAL) ×2
ELECTRODE BLDE 4.0 EZ CLN MEGD (MISCELLANEOUS) IMPLANT
ELECTRODE REM PT RTRN 9FT ADLT (ELECTROSURGICAL) ×1 IMPLANT
GAUZE XEROFORM 1X8 LF (GAUZE/BANDAGES/DRESSINGS) ×2 IMPLANT
GLOVE BIO SURGEON STRL SZ7.5 (GLOVE) ×2 IMPLANT
GLOVE BIO SURGEON STRL SZ8.5 (GLOVE) ×4 IMPLANT
GLOVE BIOGEL M 6.5 STRL (GLOVE) ×2 IMPLANT
GLOVE BIOGEL PI IND STRL 6.5 (GLOVE) IMPLANT
GLOVE BIOGEL PI IND STRL 8 (GLOVE) ×2 IMPLANT
GLOVE BIOGEL PI IND STRL 9 (GLOVE) ×1 IMPLANT
GLOVE BIOGEL PI INDICATOR 6.5 (GLOVE) ×3
GLOVE BIOGEL PI INDICATOR 8 (GLOVE) ×2
GLOVE BIOGEL PI INDICATOR 9 (GLOVE) ×1
GLOVE SURG SS PI 7.0 STRL IVOR (GLOVE) ×1 IMPLANT
GOWN STRL REUS W/ TWL LRG LVL3 (GOWN DISPOSABLE) ×2 IMPLANT
GOWN STRL REUS W/ TWL XL LVL3 (GOWN DISPOSABLE) ×3 IMPLANT
GOWN STRL REUS W/TWL LRG LVL3 (GOWN DISPOSABLE) ×4
GOWN STRL REUS W/TWL XL LVL3 (GOWN DISPOSABLE) ×6
HANDPIECE INTERPULSE COAX TIP (DISPOSABLE)
HOOD PEEL AWAY FACE SHEILD DIS (HOOD) ×4 IMPLANT
KIT BASIN OR (CUSTOM PROCEDURE TRAY) ×2 IMPLANT
KIT ROOM TURNOVER OR (KITS) ×2 IMPLANT
MANIFOLD NEPTUNE II (INSTRUMENTS) ×2 IMPLANT
NEEDLE 22X1 1/2 (OR ONLY) (NEEDLE) ×2 IMPLANT
NS IRRIG 1000ML POUR BTL (IV SOLUTION) ×2 IMPLANT
PACK TOTAL JOINT (CUSTOM PROCEDURE TRAY) ×2 IMPLANT
PAD ARMBOARD 7.5X6 YLW CONV (MISCELLANEOUS) ×4 IMPLANT
PASSER SUT SWANSON 36MM LOOP (INSTRUMENTS) ×2 IMPLANT
PRESSURIZER FEMORAL UNIV (MISCELLANEOUS) IMPLANT
SET HNDPC FAN SPRY TIP SCT (DISPOSABLE) IMPLANT
SUT ETHIBOND 2 V 37 (SUTURE) ×2 IMPLANT
SUT VIC AB 0 CTB1 27 (SUTURE) ×2 IMPLANT
SUT VIC AB 1 CTX 36 (SUTURE) ×2
SUT VIC AB 1 CTX36XBRD ANBCTR (SUTURE) ×1 IMPLANT
SUT VIC AB 2-0 CTB1 (SUTURE) ×2 IMPLANT
SUT VIC AB 3-0 SH 27 (SUTURE) ×2
SUT VIC AB 3-0 SH 27X BRD (SUTURE) ×1 IMPLANT
SYR CONTROL 10ML LL (SYRINGE) ×2 IMPLANT
TOWEL OR 17X24 6PK STRL BLUE (TOWEL DISPOSABLE) ×2 IMPLANT
TOWEL OR 17X26 10 PK STRL BLUE (TOWEL DISPOSABLE) ×2 IMPLANT
TOWER CARTRIDGE SMART MIX (DISPOSABLE) IMPLANT
TRAY FOLEY CATH 14FR (SET/KITS/TRAYS/PACK) IMPLANT
WATER STERILE IRR 1000ML POUR (IV SOLUTION) ×8 IMPLANT

## 2014-05-23 NOTE — Progress Notes (Signed)
Utilization review completed.  

## 2014-05-23 NOTE — Interval H&P Note (Signed)
History and Physical Interval Note:  05/23/2014 12:16 PM  Nicholas Hale  has presented today for surgery, with the diagnosis of OSTEOARTHRITIS LEFT HIP  The various methods of treatment have been discussed with the patient and family. After consideration of risks, benefits and other options for treatment, the patient has consented to  Procedure(s): TOTAL HIP ARTHROPLASTY (Left) as a surgical intervention .  The patient's history has been reviewed, patient examined, no change in status, stable for surgery.  I have reviewed the patient's chart and labs.  Questions were answered to the patient's satisfaction.     Kerin Salen

## 2014-05-23 NOTE — Anesthesia Preprocedure Evaluation (Addendum)
Anesthesia Evaluation  Patient identified by MRN, date of birth, ID band Patient awake    Reviewed: Allergy & Precautions, H&P , NPO status , Patient's Chart, lab work & pertinent test results  Airway Mallampati: II TM Distance: >3 FB Neck ROM: Full    Dental  (+) Teeth Intact   Pulmonary former smoker,  breath sounds clear to auscultation        Cardiovascular hypertension, Pt. on medications + Peripheral Vascular Disease Rhythm:Regular Rate:Normal     Neuro/Psych negative neurological ROS  negative psych ROS   GI/Hepatic negative GI ROS, Neg liver ROS,   Endo/Other  negative endocrine ROS  Renal/GU negative Renal ROS     Musculoskeletal   Abdominal Normal abdominal exam  (+)   Peds  Hematology   Anesthesia Other Findings   Reproductive/Obstetrics negative OB ROS                         Anesthesia Physical Anesthesia Plan  ASA: III  Anesthesia Plan: General ETT   Post-op Pain Management:    Induction:   Airway Management Planned:   Additional Equipment:   Intra-op Plan:   Post-operative Plan:   Informed Consent: I have reviewed the patients History and Physical, chart, labs and discussed the procedure including the risks, benefits and alternatives for the proposed anesthesia with the patient or authorized representative who has indicated his/her understanding and acceptance.   Dental Advisory Given  Plan Discussed with: Anesthesiologist, CRNA and Surgeon  Anesthesia Plan Comments:         Anesthesia Quick Evaluation

## 2014-05-23 NOTE — Anesthesia Procedure Notes (Signed)
Procedure Name: Intubation Date/Time: 05/23/2014 1:14 PM Performed by: Eligha Bridegroom Pre-anesthesia Checklist: Emergency Drugs available, Timeout performed, Patient identified, Suction available and Patient being monitored Patient Re-evaluated:Patient Re-evaluated prior to inductionOxygen Delivery Method: Circle system utilized Preoxygenation: Pre-oxygenation with 100% oxygen Intubation Type: IV induction Ventilation: Mask ventilation without difficulty Laryngoscope Size: Mac and 4 Grade View: Grade II Tube type: Oral Number of attempts: 1 Airway Equipment and Method: Stylet Placement Confirmation: ETT inserted through vocal cords under direct vision,  breath sounds checked- equal and bilateral and positive ETCO2 Secured at: 22 cm Dental Injury: Teeth and Oropharynx as per pre-operative assessment

## 2014-05-23 NOTE — Plan of Care (Signed)
Problem: Consults Goal: Diagnosis- Total Joint Replacement Primary Total Hip Left

## 2014-05-23 NOTE — Op Note (Signed)
OPERATIVE REPORT    DATE OF PROCEDURE:  05/23/2014       PREOPERATIVE DIAGNOSIS:  OSTEOARTHRITIS LEFT HIP                                                          POSTOPERATIVE DIAGNOSIS:  OSTEOARTHRITIS LEFT HIP                                                           PROCEDURE:  L total hip arthroplasty using a 54 mm DePuy Pinnacle  Cup, [employer], 10-degree polyethylene liner index superior  and posterior, a +0 36 mm metal head, a [id number] SROM stem, 20FL Sleeve   SURGEON: ROWAN,FRANK J    ASSISTANT:   Eric K. Barton Dubois  (present throughout entire procedure and necessary for timely completion of the procedure)   ANESTHESIA: General BLOOD LOSS: 300 FLUID REPLACEMENT: 1500 crystalloid COMPLICATIONS: none    INDICATIONS FOR PROCEDURE: A 68 y.o. year-old With  [place]   for 4 years, x-rays show bone-on-bone arthritic changes. Despite conservative measures with observation, anti-inflammatory medicine, narcotics, use of a cane, has severe unremitting pain and can ambulate only a few blocks before resting.  Patient desires elective L total hip arthroplasty to decrease pain and increase function. The risks, benefits, and alternatives were discussed at length including but not limited to the risks of infection, bleeding, nerve injury, stiffness, blood clots, the need for revision surgery, cardiopulmonary complications, among others, and they were willing to proceed. Questions answered     PROCEDURE IN DETAIL: The patient was identified by armband,  received preoperative IV antibiotics in the holding area at [facility], taken to the operating room , appropriate anesthetic monitors  were attached and general endotracheal anesthesia induced. Foley catheter was inserted. Pt was rolled into the R lateral decubitus position and fixed there with a Stulberg Mark II pelvic clamp.  The L lower extremity was then prepped and draped  in the usual sterile  fashion from the ankle to the hemipelvis. A time-out  procedure was performed. The skin along the lateral hip and thigh  infiltrated with 10 mL of 0.5% Marcaine and epinephrine solution. We  then made a posterolateral approach to the hip. With a #10 blade, a 15 cm  incision was made through the skin and subcutaneous tissue down to the level of the  IT band. Small bleeders were identified and cauterized. The IT band was cut in  line with skin incision exposing the greater trochanter. A Cobra retractor was placed between the gluteus minimus and the superior hip joint capsule, and a spiked Cobra between the quadratus femoris and the inferior hip joint capsule. This isolated the short  external rotators and piriformis tendons. These were tagged with a #2 Ethibond  suture and cut off their insertion on the intertrochanteric crest. The posterior  capsule was then developed into an acetabular-based flap from Posterior Superior off of the acetabulum out over the femoral neck and back posterior inferior to the acetabular rim. This flap was tagged with two #2 Ethibond sutures and retracted protecting the sciatic  nerve. This exposed the arthritic femoral head and osteophytes. The hip was then flexed and internally rotated, dislocating the femoral head and a standard neck cut performed 1 fingerbreadth above the lesser trochanter.  A spiked Cobra was placed in the cotyloid notch and a Hohmann retractor was then used to lever the femur anteriorly off of the anterior pelvic column. A posterior-inferior wing retractor was placed at the junction of the acetabulum and the ischium completing the acetabular exposure.We then removed the peripheral osteophytes and labrum from the acetabulum. We then reamed the acetabulum up to 53 mm with basket reamers obtaining good coverage in all quadrants. We then irrigated with normal  saline solution and hammered into place a 54 mm pinnacle cup in 45  degrees of abduction and about 20  degrees of anteversion. More  peripheral osteophytes removed and a trial 10-degree liner placed with the  index superior-posterior. The hip was then flexed and internally rotated exposing the  proximal femur, which was entered with the initiating reamer followed by  the axial reamers up to a 15.5 mm full depth and 2mm partial depth. We then conically reamed to 57F to the correct depth for a 42 base neck. The calcar was milled to 57FL. A trial cone and stem was inserted in the 25 degrees anteversion, with a +0 78mm trial head. Trial reduction was then performed and excellent stability was noted with at 90 of flexion with 75 of internal rotation and then full extension with maximal external rotation. The hip could not be dislocated in full extension. The knee could easily flex  to about 130 degrees. We also stretched the abductors at this point,  because of the preexisting adductor contractures. All trial components  were then removed. The acetabulum was irrigated out with normal saline  solution. A titanium [facility] was then screwed into place  followed by a 10-degree polyethylene liner index superior-posterior. On  the femoral side a 57FL ZTT1 sleeve was hammered into place, followed by a 20x15x42x160 SROM stem in 25 degrees of anteversion. At this point, a +0 36 mm ceramic head was  hammered on the stem. The hip was reduced. We checked our stability  one more time and found it to be excellent. The wound was once again  thoroughly irrigated out with normal saline solution pulse lavage. The  capsular flap and short external rotators were repaired back to the  intertrochanteric crest through drill holes with a #2 Ethibond suture.  The IT band was closed with running 1 Vicryl suture. The subcutaneous  tissue with 0 and 2-0 undyed Vicryl suture and the skin with running  interlocking 3-0 nylon suture. Dressing of Xeroform and Mepilex was  then applied. The patient was then unclamped,  rolled supine, awaken extubated and taken to recovery room without difficulty in stable condition.   Frederik Pear J 05/23/2014, 2:21 PM

## 2014-05-23 NOTE — Transfer of Care (Signed)
Immediate Anesthesia Transfer of Care Note  Patient: Nicholas Hale  Procedure(s) Performed: Procedure(s): TOTAL HIP ARTHROPLASTY (Left)  Patient Location: PACU  Anesthesia Type:General  Level of Consciousness: awake, alert  and oriented  Airway & Oxygen Therapy: Patient Spontanous Breathing and Patient connected to nasal cannula oxygen  Post-op Assessment: Report given to PACU RN and Post -op Vital signs reviewed and stable  Post vital signs: Reviewed and stable  Complications: No apparent anesthesia complications

## 2014-05-24 ENCOUNTER — Encounter (HOSPITAL_COMMUNITY): Payer: Self-pay | Admitting: Orthopedic Surgery

## 2014-05-24 LAB — CBC
HCT: 43.7 % (ref 39.0–52.0)
Hemoglobin: 14.2 g/dL (ref 13.0–17.0)
MCH: 32.1 pg (ref 26.0–34.0)
MCHC: 32.5 g/dL (ref 30.0–36.0)
MCV: 98.9 fL (ref 78.0–100.0)
Platelets: 181 10*3/uL (ref 150–400)
RBC: 4.42 MIL/uL (ref 4.22–5.81)
RDW: 12.8 % (ref 11.5–15.5)
WBC: 10.3 10*3/uL (ref 4.0–10.5)

## 2014-05-24 LAB — BASIC METABOLIC PANEL
BUN: 13 mg/dL (ref 6–23)
CO2: 24 mEq/L (ref 19–32)
CREATININE: 0.91 mg/dL (ref 0.50–1.35)
Calcium: 8.7 mg/dL (ref 8.4–10.5)
Chloride: 98 mEq/L (ref 96–112)
GFR calc non Af Amer: 85 mL/min — ABNORMAL LOW (ref >90)
Glucose, Bld: 144 mg/dL — ABNORMAL HIGH (ref 70–99)
Potassium: 4.1 mEq/L (ref 3.7–5.3)
Sodium: 135 mEq/L — ABNORMAL LOW (ref 137–147)

## 2014-05-24 MED ORDER — PNEUMOCOCCAL VAC POLYVALENT 25 MCG/0.5ML IJ INJ
0.5000 mL | INJECTION | INTRAMUSCULAR | Status: AC
  Administered 2014-05-25: 0.5 mL via INTRAMUSCULAR
  Filled 2014-05-24: qty 0.5

## 2014-05-24 NOTE — Anesthesia Postprocedure Evaluation (Signed)
Anesthesia Post Note  Patient: Nicholas Hale  Procedure(s) Performed: Procedure(s) (LRB): TOTAL HIP ARTHROPLASTY (Left)  Anesthesia type: general  Patient location: PACU  Post pain: Pain level controlled  Post assessment: Patient's Cardiovascular Status Stable  Post vital signs: Reviewed and stable  Level of consciousness: sedated  Complications: No apparent anesthesia complications

## 2014-05-24 NOTE — Progress Notes (Signed)
Patient ID: Nicholas Hale, male   DOB: [birth year], 68 y.o.   MRN: [id number] PATIENT ID: Nicholas Hale  MRN: [id number]  DOB/AGE:  [birth year] / 68 y.o.  1 Day Post-Op Procedure(s) (LRB): TOTAL HIP ARTHROPLASTY (Left)    PROGRESS NOTE Subjective: Patient is alert, oriented, no Nausea, no Vomiting, no passing gas, no Bowel Movement. Taking PO sips. Denies SOB, Chest or Calf Pain. Using Incentive Spirometer, PAS in place. Ambulate WBAT today Patient reports pain as 3 on 0-10 scale  .    Objective: Vital signs in last 24 hours: Filed Vitals:   05/23/14 2348 05/24/14 0000 05/24/14 0329 05/24/14 0421  BP: 143/60   136/58  Pulse: 72   69  Temp: 98.9 F (37.2 C)   98.9 F (37.2 C)  TempSrc: Oral   Oral  Resp: 16 18 18 16   Weight:      SpO2: 95% 95% 94% 96%      Intake/Output from previous day: I/O last 3 completed shifts: In: 1800 [I.V.:1800] Out: 2175 [Urine:1925; Blood:250]   Intake/Output this shift:     LABORATORY DATA:  Recent Labs  05/23/14 2153 05/24/14 0510  WBC  --  10.3  HGB  --  14.2  HCT  --  43.7  PLT  --  181  NA  --  135*  K  --  4.1  CL  --  98  CO2  --  24  BUN  --  13  CREATININE  --  0.91  GLUCOSE  --  144*  GLUCAP 164*  --   CALCIUM  --  8.7    Examination: Neurologically intact ABD soft Neurovascular intact Sensation intact distally Intact pulses distally Dorsiflexion/Plantar flexion intact Incision: dressing C/D/I No cellulitis present Compartment soft n/a} XR AP&Lat of hip shows well placed\fixed THA  Assessment:   1 Day Post-Op Procedure(s) (LRB): TOTAL HIP ARTHROPLASTY (Left) ADDITIONAL DIAGNOSIS:  Hypertension  Plan: PT/OT WBAT, THA  posterior precautions  DVT Prophylaxis: SCDx72 hrs, ASA 325 mg BID x 2 weeks  DISCHARGE PLAN: Home  DISCHARGE NEEDS: HHPT, HHRN, CPM, Walker and 3-in-1 comode seat

## 2014-05-24 NOTE — Progress Notes (Signed)
Physical Therapy Treatment Patient Details Name: Nicholas Hale MRN: [id number] DOB: 1946-01-14 Today's Date: 05/24/2014    History of Present Illness s/p Lt THA 05/23/14    PT Comments    Pt progressing very well with physical therapy. C/o having difficulty voiding, RN is aware. Pt and wife hopeful for D/C home tomorrow. Will plan to practice stair management again next session.   Follow Up Recommendations  Home health PT;Supervision/Assistance - 24 hour     Equipment Recommendations  Rolling walker with 5" wheels;3in1 (PT)    Recommendations for Other Services OT consult     Precautions / Restrictions Precautions Precautions: Posterior Hip Precaution Booklet Issued: Yes (comment) Precaution Comments: pt able to recall 2/3 independently; reviewed precautions throughout session  Restrictions Weight Bearing Restrictions: Yes LLE Weight Bearing: Weight bearing as tolerated    Mobility  Bed Mobility Overal bed mobility: Needs Assistance Bed Mobility: Sit to Supine       Sit to supine: Min assist   General bed mobility comments: (A) to advance Lt LE into supine position and to maitnain posterior precautions   Transfers Overall transfer level: Needs assistance Equipment used: Rolling walker (2 wheeled) Transfers: Sit to/from Stand Sit to Stand: Supervision         General transfer comment: cues for hand placement and safety with RW   Ambulation/Gait Ambulation/Gait assistance: Supervision Ambulation Distance (Feet): 300 Feet Assistive device: Rolling walker (2 wheeled) Gait Pattern/deviations: Step-through pattern;Narrow base of support;Decreased stride length;Trunk flexed Gait velocity: decreased Gait velocity interpretation: Below normal speed for age/gender General Gait Details: pt ambulating at step through gt but tends to take short shuffled steps; cues for upright posture and to increase bil knee flexion; pt guarded and ambulating with "stiff"  gt   Stairs Stairs: Yes Stairs assistance: Min guard Stair Management: One rail Right;With cane;Step to pattern;Forwards Number of Stairs: 5 (2 up; 3 down ) General stair comments: verbal and visual cues for proper technique; min guard to steady; wife present for session for family education   Wheelchair Mobility    Modified Rankin (Stroke Patients Only)       Balance Overall balance assessment: No apparent balance deficits (not formally assessed)                                  Cognition Arousal/Alertness: Awake/alert Behavior During Therapy: WFL for tasks assessed/performed Overall Cognitive Status: Within Functional Limits for tasks assessed       Memory: Decreased recall of precautions              Exercises Total Joint Exercises Ankle Circles/Pumps: AROM;Both;10 reps;Supine Gluteal Sets: AROM;Strengthening;Both;10 reps;Seated Short Arc Quad: AROM;Strengthening;Left;10 reps;Supine Hip ABduction/ADduction: AROM;Left;10 reps;Seated Long Arc Quad: AROM;Strengthening;Left;10 reps;Seated Knee Flexion: Standing;AROM;5 reps (RW supporting UEs)    General Comments General comments (skin integrity, edema, etc.): wife would like to bring in pt cane to be adjusted and practice on steps next session       Pertinent Vitals/Pain C/o 8/10 pain; RN made aware. patient repositioned for comfort     Home Living                      Prior Function            PT Goals (current goals can now be found in the care plan section) Acute Rehab PT Goals Patient Stated Goal: to go home tomorrow or saturday PT Goal  Formulation: With patient Time For Goal Achievement: 05/28/14 Potential to Achieve Goals: Good Progress towards PT goals: Progressing toward goals    Frequency  7X/week    PT Plan Current plan remains appropriate    Co-evaluation             End of Session Equipment Utilized During Treatment: Gait belt Activity Tolerance:  Patient tolerated treatment well Patient left: in bed;with call bell/phone within reach;with family/visitor present     Time: 1610-9604 PT Time Calculation (min): 28 min  Charges:  $Gait Training: 8-22 mins $Therapeutic Exercise: 8-22 mins                    G CodesElie Confer Re[place], [place] 05/24/2014, 5:02 PM

## 2014-05-24 NOTE — Progress Notes (Signed)
Referral received for SNF. Chart reviewed and PT recommends home with home health.  Discussed with RNCM- Manuela Schwartz. Plan DC to home with Home Health and DME as indicated.  CSW to sign off. Please re-consult if CSW needs arise.  Lorie Phenix. [place], [place]

## 2014-05-24 NOTE — Evaluation (Signed)
Physical Therapy Evaluation Patient Details Name: Nicholas Hale MRN: [id number] DOB: [birth year] Today's Date: 05/24/2014   History of Present Illness  s/p Lt THA 05/23/14  Clinical Impression  Pt is s/p Lt THA POD#1 resulting in the deficits listed below (see PT Problem List). Pt will benefit from skilled PT to increase their independence and safety with mobility to allow discharge to the venue listed below. Pt moving very well during evaluation. Patient needs to practice stairs next session.        Follow Up Recommendations Home health PT;Supervision/Assistance - 24 hour    Equipment Recommendations  Rolling walker with 5" wheels;3in1 (PT)    Recommendations for Other Services OT consult     Precautions / Restrictions Precautions Precautions: Posterior Hip Precaution Booklet Issued: Yes (comment) Precaution Comments: given handout and educated on precautions  Restrictions Weight Bearing Restrictions: Yes LLE Weight Bearing: Weight bearing as tolerated      Mobility  Bed Mobility Overal bed mobility: Needs Assistance Bed Mobility: Supine to Sit     Supine to sit: Supervision;HOB elevated     General bed mobility comments: supervision for cues for hand placement and to adhere to hip precautions   Transfers Overall transfer level: Needs assistance Equipment used: Rolling walker (2 wheeled) Transfers: Sit to/from Stand Sit to Stand: Min guard;From elevated surface         General transfer comment: cues for hand placement and hip precautions; min guard to steady  Ambulation/Gait Ambulation/Gait assistance: Min guard Ambulation Distance (Feet): 120 Feet Assistive device: Rolling walker (2 wheeled) Gait Pattern/deviations: Step-through pattern;Narrow base of support Gait velocity: decreased; guarded Gait velocity interpretation: Below normal speed for age/gender General Gait Details: cues for step through pattern; pt able to progress to step through pattern;  cues for upright posture and to adhere to hip precautions with direction changes to avoid IR of Lt LE   Stairs            Wheelchair Mobility    Modified Rankin (Stroke Patients Only)       Balance Overall balance assessment: Needs assistance Sitting-balance support: No upper extremity supported;Feet supported Sitting balance-Leahy Scale: Good     Standing balance support: During functional activity;Bilateral upper extremity supported Standing balance-Leahy Scale: Poor Standing balance comment: relies on RW                              Pertinent Vitals/Pain "no pain now"     Home Living Family/patient expects to be discharged to:: Private residence Living Arrangements: Spouse/significant other Available Help at Discharge: Family;Available 24 hours/day Type of Home: House Home Access: Stairs to enter Entrance Stairs-Rails: Right;Left (cant reach both) Entrance Stairs-Number of Steps: 8 Home Layout: Able to live on main level with bedroom/bathroom;Full bath on main level Home Equipment: None Additional Comments: pt has walk in shower; elevated toilet seats     Prior Function Level of Independence: Independent               Hand Dominance   Dominant Hand: Right    Extremity/Trunk Assessment   Upper Extremity Assessment: Defer to OT evaluation           Lower Extremity Assessment: LLE deficits/detail   LLE Deficits / Details: hip limited due to pain; knee 4/5  Cervical / Trunk Assessment: Normal  Communication   Communication: No difficulties  Cognition Arousal/Alertness: Awake/alert Behavior During Therapy: WFL for tasks assessed/performed Overall Cognitive Status: Within  Functional Limits for tasks assessed                      General Comments      Exercises Total Joint Exercises Ankle Circles/Pumps: AROM;Both;10 reps;Supine Quad Sets: AROM;Strengthening;Left;10 reps;Seated Gluteal Sets: AROM;Strengthening;Both;10  reps;Seated Hip ABduction/ADduction: AROM;Left;10 reps;Seated Long Arc Quad: AROM;Strengthening;Left;10 reps;Seated      Assessment/Plan    PT Assessment Patient needs continued PT services  PT Diagnosis Difficulty walking;Generalized weakness;Acute pain   PT Problem List Decreased strength;Decreased activity tolerance;Decreased balance;Decreased mobility;Decreased range of motion;Decreased knowledge of use of DME;Decreased knowledge of precautions;Pain  PT Treatment Interventions DME instruction;Gait training;Stair training;Functional mobility training;Therapeutic activities;Therapeutic exercise;Balance training;Neuromuscular re-education;Patient/family education   PT Goals (Current goals can be found in the Care Plan section) Acute Rehab PT Goals Patient Stated Goal: to go home tomorrow or saturday PT Goal Formulation: With patient Time For Goal Achievement: 05/28/14 Potential to Achieve Goals: Good    Frequency 7X/week   Barriers to discharge        Co-evaluation               End of Session Equipment Utilized During Treatment: Gait belt Activity Tolerance: Patient tolerated treatment well Patient left: in chair;with call bell/phone within reach;with family/visitor present Nurse Communication: Mobility status         Time: 0814-4818 PT Time Calculation (min): 30 min   Charges:   PT Evaluation $Initial PT Evaluation Tier I: 1 Procedure PT Treatments $Gait Training: 8-22 mins $Therapeutic Exercise: 8-22 mins   PT G CodesGustavus Bryant , [place]  [phone]  05/24/2014, 9:35 AM

## 2014-05-24 NOTE — Progress Notes (Signed)
CSW ordered received to assist patient with SNF placement for short term. Will meet with patient and family today as well as monitor for PT recommendations to determine appropriate d/c options for patient. Fl2 initiated and will place on chart asap.  CSW to follow.  Lorie Phenix. [place], [place]  (coverage)

## 2014-05-24 NOTE — Progress Notes (Signed)
Patient voiding frequent small amounts between 100-150 cc, pt denies discomfort. Initial bladder scan showed 850c, now reading 350-400 cc. IV fluids continue and po fluids being pushed.

## 2014-05-24 NOTE — Progress Notes (Signed)
Pt continues to void small amount since I/O cath done at 0430. Bladder scan showed 916 cc urine. I/O cath done with a results of 1000cc urine. Will continue to monitor pts output.

## 2014-05-25 LAB — CBC
HCT: 41 % (ref 39.0–52.0)
Hemoglobin: 13.4 g/dL (ref 13.0–17.0)
MCH: 31.7 pg (ref 26.0–34.0)
MCHC: 32.7 g/dL (ref 30.0–36.0)
MCV: 96.9 fL (ref 78.0–100.0)
Platelets: 151 10*3/uL (ref 150–400)
RBC: 4.23 MIL/uL (ref 4.22–5.81)
RDW: 13 % (ref 11.5–15.5)
WBC: 12.1 10*3/uL — AB (ref 4.0–10.5)

## 2014-05-25 NOTE — Progress Notes (Signed)
PATIENT ID: Nicholas Hale  MRN: [id number]  DOB/AGE:  1946/08/06 / 68 y.o.  2 Days Post-Op Procedure(s) (LRB): TOTAL HIP ARTHROPLASTY (Left)    PROGRESS NOTE Subjective: Patient is alert, oriented, no Nausea, no Vomiting, yes passing gas, no Bowel Movement. Taking PO well, pt finished with breakfast. Denies SOB, Chest or Calf Pain. Using Incentive Spirometer, PAS in place. Ambulate WBAT Patient reports pain as 6 on 0-10 scale  .    Objective: Vital signs in last 24 hours: Filed Vitals:   05/24/14 1400 05/24/14 1600 05/24/14 1959 05/25/14 0532  BP: 119/62  124/60 128/60  Pulse: 90  109 86  Temp: 100.5 F (38.1 C)  99.2 F (37.3 C) 98.6 F (37 C)  TempSrc:   Oral Oral  Resp: 18 18 16 20   Weight:      SpO2: 94%  91% 92%      Intake/Output from previous day: I/O last 3 completed shifts: In: 720 [P.O.:720] Out: 3250 [Urine:3250]   Intake/Output this shift:     LABORATORY DATA:  Recent Labs  05/23/14 2153 05/24/14 0510 05/25/14 0512  WBC  --  10.3 12.1*  HGB  --  14.2 13.4  HCT  --  43.7 41.0  PLT  --  181 151  NA  --  135*  --   K  --  4.1  --   CL  --  98  --   CO2  --  24  --   BUN  --  13  --   CREATININE  --  0.91  --   GLUCOSE  --  144*  --   GLUCAP 164*  --   --   CALCIUM  --  8.7  --     Examination: Neurologically intact Neurovascular intact Sensation intact distally Intact pulses distally Dorsiflexion/Plantar flexion intact Incision: dressing C/D/I No cellulitis present Compartment soft} XR AP&Lat of hip shows well placed\fixed THA  Assessment:   2 Days Post-Op Procedure(s) (LRB): TOTAL HIP ARTHROPLASTY (Left) ADDITIONAL DIAGNOSIS:  Hypertension  Plan: PT/OT WBAT, THA  posterior precautions  DVT Prophylaxis: SCDx72 hrs, ASA 325 mg BID x 2 weeks  DISCHARGE PLAN: Home, today after pt has passed PT goals  DISCHARGE NEEDS: HHPT, HHRN, Walker and 3-in-1 comode seat

## 2014-05-25 NOTE — Discharge Summary (Signed)
Patient ID: Nicholas Hale MRN: [id number] DOB/AGE: 1946-04-14 68 y.o.  Admit date: 05/23/2014 Discharge date: 05/25/2014  Admission Diagnoses:  Active Problems:   Arthritis of left hip   Discharge Diagnoses:  Same  Past Medical History  Diagnosis Date  . Hypertension   . Hyperlipidemia   . Abdominal aortic aneurysm   . Arthritis     "hips; lower spine" (05/24/2014)    Surgeries: Procedure(s): TOTAL HIP ARTHROPLASTY on 05/23/2014   Consultants:    Discharged Condition: Improved  Hospital Course: DASHTON CZERWINSKI is an 68 y.o. male who was admitted 05/23/2014 for operative treatment of<principal problem not specified>. Patient has severe unremitting pain that affects sleep, daily activities, and work/hobbies. After pre-op clearance the patient was taken to the operating room on 05/23/2014 and underwent  Procedure(s): TOTAL HIP ARTHROPLASTY.    Patient was given perioperative antibiotics: Anti-infectives   Start     Dose/Rate Route Frequency Ordered Stop   05/23/14 1057  ceFAZolin (ANCEF) IVPB 2 g/50 mL premix     2 g 100 mL/hr over 30 Minutes Intravenous On call to O.R. 05/23/14 1057 05/23/14 1300       Patient was given sequential compression devices, early ambulation, and chemoprophylaxis to prevent DVT.  Patient benefited maximally from hospital stay and there were no complications.    Recent vital signs: Patient Vitals for the past 24 hrs:  BP Temp Temp src Pulse Resp SpO2  05/25/14 0532 128/60 mmHg 98.6 F (37 C) Oral 86 20 92 %  05/24/14 1959 124/60 mmHg 99.2 F (37.3 C) Oral 109 16 91 %  05/24/14 1600 - - - - 18 -  05/24/14 1400 119/62 mmHg 100.5 F (38.1 C) - 90 18 94 %  05/24/14 1200 - - - - 18 -     Recent laboratory studies:  Recent Labs  05/24/14 0510 05/25/14 0512  WBC 10.3 12.1*  HGB 14.2 13.4  HCT 43.7 41.0  PLT 181 151  NA 135*  --   K 4.1  --   CL 98  --   CO2 24  --   BUN 13  --   CREATININE 0.91  --   GLUCOSE 144*  --   CALCIUM  8.7  --      Discharge Medications:     Medication List    STOP taking these medications       ibuprofen 200 MG tablet  Commonly known as:  ADVIL,MOTRIN      TAKE these medications       aspirin EC 325 MG tablet  Take 1 tablet (325 mg total) by mouth 2 (two) times daily.     lisinopril-hydrochlorothiazide 20-12.5 MG per tablet  Commonly known as:  PRINZIDE,ZESTORETIC  Take 1 tablet by mouth daily.     meloxicam 15 MG tablet  Commonly known as:  MOBIC  Take 15 mg by mouth daily as needed for pain.     methocarbamol 500 MG tablet  Commonly known as:  ROBAXIN  Take 1 tablet (500 mg total) by mouth 2 (two) times daily with a meal.     oxyCODONE-acetaminophen 5-325 MG per tablet  Commonly known as:  ROXICET  Take 1 tablet by mouth every 4 (four) hours as needed.     pravastatin 40 MG tablet  Commonly known as:  PRAVACHOL  Take 40 mg by mouth daily.     traMADol 50 MG tablet  Commonly known as:  ULTRAM  Take 50 mg by mouth every 6 (  six) hours as needed.        Diagnostic Studies: Dg Chest 2 View  05/17/2014   CLINICAL DATA:  Hypertension.  EXAM: CHEST  2 VIEW  COMPARISON:  January 03, 2009.  FINDINGS: The heart size and mediastinal contours are within normal limits. Both lungs are clear. No pneumothorax or pleural effusion is noted. Mild anterior osteophyte formation is noted in the mid thoracic spine.  IMPRESSION: No acute cardiopulmonary abnormality seen.   Electronically Signed   By: Sabino Dick M.D.   On: 05/17/2014 09:47   Dg Pelvis Portable  05/23/2014   CLINICAL DATA:  Status post left hip replacement  EXAM: PORTABLE PELVIS 1-2 VIEWS  COMPARISON:  None.  FINDINGS: A left hip replacement is noted. No acute bony abnormality is seen. Degenerative changes of the right hip and lumbar spine are seen. No soft tissue changes are noted.   Electronically Signed   By: Inez Catalina M.D.   On: 05/23/2014 15:25    Disposition:       Discharge Instructions   Call MD /  Call 911    Complete by:  As directed   If you experience chest pain or shortness of breath, CALL 911 and be transported to the hospital emergency room.  If you develope a fever above 101 F, pus (white drainage) or increased drainage or redness at the wound, or calf pain, call your surgeon's office.     Change dressing    Complete by:  As directed   You may change your dressing on day 5, then change the dressing daily with sterile 4 x 4 inch gauze dressing and paper tape.  You may clean the incision with alcohol prior to redressing     Constipation Prevention    Complete by:  As directed   Drink plenty of fluids.  Prune juice may be helpful.  You may use a stool softener, such as Colace (over the counter) 100 mg twice a day.  Use MiraLax (over the counter) for constipation as needed.     Diet - low sodium heart healthy    Complete by:  As directed      Discharge instructions    Complete by:  As directed   Follow up in office with Dr. Mayer Camel in 2 weeks.     Driving restrictions    Complete by:  As directed   No driving for 2 weeks     Follow the hip precautions as taught in Physical Therapy    Complete by:  As directed      Increase activity slowly as tolerated    Complete by:  As directed      Patient may shower    Complete by:  As directed   You may shower without a dressing once there is no drainage.  Do not wash over the wound.  If drainage remains, cover wound with plastic wrap and then shower.           Follow-up Information   Follow up with Kerin Salen, MD In 2 weeks.   Specialty:  Orthopedic Surgery   Contact information:   [place] [address] [phone]        Signed: Hardin Negus ERIC R 05/25/2014, 8:09 AM

## 2014-05-25 NOTE — Evaluation (Signed)
Occupational Therapy Evaluation Patient Details Name: Nicholas Hale MRN: [id number] DOB: 1946/07/16 Today's Date: 05/25/2014    History of Present Illness s/p Lt THA 05/23/14   Clinical Impression   Pt s/p Lt THA.  Pt independent at baseline. OT educated pt and spouse on ADL techniques and AE as well as reinforcing posterior hip precaution education. Pt has AE at home and wife able to assist as much as needed. Pt anticipates return home today. Education completed. OT to sign off.    Follow Up Recommendations  No OT follow up;Supervision/Assistance - 24 hour    Equipment Recommendations  None recommended by OT (3n1 delivered while OT present in room)    Recommendations for Other Services       Precautions / Restrictions Precautions Precautions: Posterior Hip Precaution Comments: Pt able to independently recall 3/3 posterior hip precautions. Restrictions Weight Bearing Restrictions: Yes LLE Weight Bearing: Weight bearing as tolerated      Mobility Bed Mobility Overal bed mobility:  (not assessed- pt up in chair) Bed Mobility: Supine to Sit     Supine to sit: Min guard     General bed mobility comments: Min guard for safety with verbal cues for technique. Wife educated on guarding technique. Pt performed with HOB flat and no use of rail. Required extra time and VCs to prevent scooting too close to edge. Verbalizes understanding.  Transfers Overall transfer level: Needs assistance Equipment used: Rolling walker (2 wheeled) Transfers: Sit to/from Stand Sit to Stand: Supervision         General transfer comment: supervision for safety    Balance                                            ADL Overall ADL's : Needs assistance/impaired         Upper Body Bathing: Set up;Sitting   Lower Body Bathing: Moderate assistance;Sit to/from stand Lower Body Bathing Details (indicate cue type and reason): due to posterior hip precautions Upper Body  Dressing : Set up;Sitting   Lower Body Dressing: Moderate assistance;Sit to/from stand Lower Body Dressing Details (indicate cue type and reason): due to posterior hip precautions Toilet Transfer: Supervision/safety;Comfort height toilet;Ambulation           Functional mobility during ADLs: Supervision/safety General ADL Comments: Pt's spouse present throughout session.  OT instructed on use of AE for ADL performance. Pt has reachers but has not used them for LB dressing tasks before. OT also instructed on use of 3n1 as a shower chair.  Pt's RW and 3n1 were delivered to room while OT was present. Wife to assist as much as needed, and she demonstarted independence with assisting.      Vision                     Perception     Praxis      Pertinent Vitals/Pain See vitals     Hand Dominance Right   Extremity/Trunk Assessment Upper Extremity Assessment Upper Extremity Assessment: Overall WFL for tasks assessed           Communication Communication Communication: No difficulties   Cognition Arousal/Alertness: Awake/alert Behavior During Therapy: WFL for tasks assessed/performed Overall Cognitive Status: Within Functional Limits for tasks assessed                     General Comments  Exercises       Shoulder Instructions      Home Living Family/patient expects to be discharged to:: Private residence Living Arrangements: Spouse/significant other Available Help at Discharge: Family;Available 24 hours/day Type of Home: House Home Access: Stairs to enter [employer] of Steps: 8 Entrance Stairs-Rails: Right;Left Home Layout: Able to live on main level with bedroom/bathroom;Full bath on main level     Bathroom Shower/Tub: Occupational psychologist: Handicapped height     Home Equipment: Radiation protection practitioner Equipment: Long-handled sponge;Reacher        Prior Functioning/Environment Level of Independence:  Independent             OT Diagnosis:     OT Problem List:     OT Treatment/Interventions:      OT Goals(Current goals can be found in the care plan section)    OT Frequency:     Barriers to D/C:            Co-evaluation              End of Session Equipment Utilized During Treatment: Gait belt;Rolling walker  Activity Tolerance: Patient tolerated treatment well Patient left: in chair;with call bell/phone within reach;with family/visitor present   Time: 1025-1049 OT Time Calculation (min): 24 min Charges:  OT General Charges $OT Visit: 1 Procedure OT Evaluation $Initial OT Evaluation Tier I: 1 Procedure OT Treatments $Self Care/Home Management : 8-22 mins G-Codes:    Darrol Jump 05/25/2014, 12:16 PM   05/25/2014 Darrol Jump OTR/L Pager [phone] Office [phone]

## 2014-05-25 NOTE — Care Management Note (Signed)
CARE MANAGEMENT NOTE 05/25/2014  Patient:  Nicholas Hale, Nicholas Hale   Account Number:  [id number]  Date Initiated:  05/25/2014  Documentation initiated by:  Ricki Miller  Subjective/Objective Assessment:   68 yr old male s/p left total hip arthroplasty.     Action/Plan:   Case manager spoke with patient and wife concerning home health and DME needs at discharge. Patient's wife states they are setup with [facility]. Case manager to confirm.   Anticipated DC Date:  05/25/2014   Anticipated DC Plan:  [facility]  CM consult      [facility] Choice  HOME HEALTH  DURABLE MEDICAL EQUIPMENT   Choice offered to / List presented to:  C-1 Patient   DME arranged  3-N-1  Vassie Moselle      DME agency  [place] arranged  [place]   Status of service:  Completed, signed off Medicare Important Message given?  NA - LOS <3 / Initial given by admissions (If response is "NO", the following Medicare IM given date fields will be blank) Date Medicare IM given:  05/17/2014

## 2014-05-25 NOTE — Progress Notes (Signed)
Physical Therapy Treatment Patient Details Name: Nicholas Hale MRN: [id number] DOB: 22-Dec-1945 Today's Date: 05/25/2014    History of Present Illness s/p Lt THA 05/23/14    PT Comments    Patient continues to progress well towards physical therapy goals. Has completed stair training safely and verbalizes understanding of safe technique. Ambulating up to 300 feet using rolling walker at supervision level. All pertienent education has been reviewed and patient has no further questions at this time. Patient is adequate for d/c to home with 24 hour care from a mobility standpoint.  Follow Up Recommendations  Home health PT;Supervision/Assistance - 24 hour     Equipment Recommendations  Rolling walker with 5" wheels;3in1 (PT)    Recommendations for Other Services OT consult     Precautions / Restrictions Precautions Precautions: Posterior Hip Precaution Comments: pt able to recall 3/3 independently; reviewed precautions throughout session  Restrictions Weight Bearing Restrictions: Yes LLE Weight Bearing: Weight bearing as tolerated    Mobility  Bed Mobility Overal bed mobility: Needs Assistance Bed Mobility: Supine to Sit     Supine to sit: Min guard     General bed mobility comments: Min guard for safety with verbal cues for technique. Wife educated on guarding technique. Pt performed with HOB flat and no use of rail. Required extra time and VCs to prevent scooting too close to edge. Verbalizes understanding.  Transfers Overall transfer level: Needs assistance Equipment used: Rolling walker (2 wheeled) Transfers: Sit to/from Stand Sit to Stand: Supervision         General transfer comment: Superviion for safety. Correct hand placement and no physical assist needed. Follows safe post. hip precautions  Ambulation/Gait Ambulation/Gait assistance: Supervision Ambulation Distance (Feet): 300 Feet Assistive device: Rolling walker (2 wheeled) Gait Pattern/deviations:  Step-through pattern;Ataxic;Antalgic   Gait velocity interpretation: Below normal speed for age/gender General Gait Details: VCs for forward gaze and education on safe DME use and placement. Demonstrating good step-through gait technique with Min cues for left glute activation in stance phase. Mildly antalgic pattern.   Stairs Stairs: Yes Stairs assistance: Supervision Stair Management: One rail Left;Step to pattern;Sideways Number of Stairs: 2 (x2) General stair comments: Educated on safe stair navigation using Lt rail and sideways technique. Pt reports he feels safe and stable leading with RLE when ascending and LLE when descending. VCs for sequencing and pt able to teach back correctly.  Wheelchair Mobility    Modified Rankin (Stroke Patients Only)       Balance                                    Cognition Arousal/Alertness: Awake/alert Behavior During Therapy: WFL for tasks assessed/performed Overall Cognitive Status: Within Functional Limits for tasks assessed                      Exercises      General Comments        Pertinent Vitals/Pain 7/10 pain Nurse notified. Patient repositioned in chair for comfort.     Home Living                      Prior Function            PT Goals (current goals can now be found in the care plan section) Acute Rehab PT Goals PT Goal Formulation: With patient Time For Goal Achievement: 05/28/14 Potential to Achieve Goals:  Good Progress towards PT goals: Progressing toward goals    Frequency  7X/week    PT Plan Current plan remains appropriate    Co-evaluation             End of Session   Activity Tolerance: Patient tolerated treatment well Patient left: with call bell/phone within reach;with family/visitor present;in chair     Time: 0092-3300 PT Time Calculation (min): 26 min  Charges:  $Gait Training: 8-22 mins $Therapeutic Activity: 8-22 mins                    G  Codes:      [employer], [place]  Ellouise Newer 05/25/2014, 10:20 AM

## 2014-05-27 DIAGNOSIS — E785 Hyperlipidemia, unspecified: Secondary | ICD-10-CM | POA: Diagnosis not present

## 2014-05-27 DIAGNOSIS — Z96649 Presence of unspecified artificial hip joint: Secondary | ICD-10-CM | POA: Diagnosis not present

## 2014-05-27 DIAGNOSIS — Z471 Aftercare following joint replacement surgery: Secondary | ICD-10-CM | POA: Diagnosis not present

## 2014-05-27 DIAGNOSIS — I10 Essential (primary) hypertension: Secondary | ICD-10-CM | POA: Diagnosis not present

## 2014-05-27 DIAGNOSIS — IMO0001 Reserved for inherently not codable concepts without codable children: Secondary | ICD-10-CM | POA: Diagnosis not present

## 2014-05-29 DIAGNOSIS — Z471 Aftercare following joint replacement surgery: Secondary | ICD-10-CM | POA: Diagnosis not present

## 2014-05-29 DIAGNOSIS — E785 Hyperlipidemia, unspecified: Secondary | ICD-10-CM | POA: Diagnosis not present

## 2014-05-29 DIAGNOSIS — I10 Essential (primary) hypertension: Secondary | ICD-10-CM | POA: Diagnosis not present

## 2014-05-29 DIAGNOSIS — Z96649 Presence of unspecified artificial hip joint: Secondary | ICD-10-CM | POA: Diagnosis not present

## 2014-05-29 DIAGNOSIS — IMO0001 Reserved for inherently not codable concepts without codable children: Secondary | ICD-10-CM | POA: Diagnosis not present

## 2014-05-31 DIAGNOSIS — I10 Essential (primary) hypertension: Secondary | ICD-10-CM | POA: Diagnosis not present

## 2014-05-31 DIAGNOSIS — Z96649 Presence of unspecified artificial hip joint: Secondary | ICD-10-CM | POA: Diagnosis not present

## 2014-05-31 DIAGNOSIS — E785 Hyperlipidemia, unspecified: Secondary | ICD-10-CM | POA: Diagnosis not present

## 2014-05-31 DIAGNOSIS — Z471 Aftercare following joint replacement surgery: Secondary | ICD-10-CM | POA: Diagnosis not present

## 2014-05-31 DIAGNOSIS — IMO0001 Reserved for inherently not codable concepts without codable children: Secondary | ICD-10-CM | POA: Diagnosis not present

## 2014-06-04 DIAGNOSIS — Z96649 Presence of unspecified artificial hip joint: Secondary | ICD-10-CM | POA: Diagnosis not present

## 2014-06-04 DIAGNOSIS — E785 Hyperlipidemia, unspecified: Secondary | ICD-10-CM | POA: Diagnosis not present

## 2014-06-04 DIAGNOSIS — I10 Essential (primary) hypertension: Secondary | ICD-10-CM | POA: Diagnosis not present

## 2014-06-04 DIAGNOSIS — Z471 Aftercare following joint replacement surgery: Secondary | ICD-10-CM | POA: Diagnosis not present

## 2014-06-04 DIAGNOSIS — IMO0001 Reserved for inherently not codable concepts without codable children: Secondary | ICD-10-CM | POA: Diagnosis not present

## 2014-06-05 DIAGNOSIS — M1610 Unilateral primary osteoarthritis, unspecified hip: Secondary | ICD-10-CM | POA: Diagnosis not present

## 2014-06-06 DIAGNOSIS — Z471 Aftercare following joint replacement surgery: Secondary | ICD-10-CM | POA: Diagnosis not present

## 2014-06-06 DIAGNOSIS — IMO0001 Reserved for inherently not codable concepts without codable children: Secondary | ICD-10-CM | POA: Diagnosis not present

## 2014-06-06 DIAGNOSIS — Z96649 Presence of unspecified artificial hip joint: Secondary | ICD-10-CM | POA: Diagnosis not present

## 2014-06-06 DIAGNOSIS — I10 Essential (primary) hypertension: Secondary | ICD-10-CM | POA: Diagnosis not present

## 2014-06-06 DIAGNOSIS — E785 Hyperlipidemia, unspecified: Secondary | ICD-10-CM | POA: Diagnosis not present

## 2014-06-07 DIAGNOSIS — Z96649 Presence of unspecified artificial hip joint: Secondary | ICD-10-CM | POA: Diagnosis not present

## 2014-06-07 DIAGNOSIS — I10 Essential (primary) hypertension: Secondary | ICD-10-CM | POA: Diagnosis not present

## 2014-06-07 DIAGNOSIS — Z471 Aftercare following joint replacement surgery: Secondary | ICD-10-CM | POA: Diagnosis not present

## 2014-06-07 DIAGNOSIS — E785 Hyperlipidemia, unspecified: Secondary | ICD-10-CM | POA: Diagnosis not present

## 2014-06-07 DIAGNOSIS — IMO0001 Reserved for inherently not codable concepts without codable children: Secondary | ICD-10-CM | POA: Diagnosis not present

## 2014-06-11 DIAGNOSIS — I10 Essential (primary) hypertension: Secondary | ICD-10-CM | POA: Diagnosis not present

## 2014-06-11 DIAGNOSIS — Z96649 Presence of unspecified artificial hip joint: Secondary | ICD-10-CM | POA: Diagnosis not present

## 2014-06-11 DIAGNOSIS — E785 Hyperlipidemia, unspecified: Secondary | ICD-10-CM | POA: Diagnosis not present

## 2014-06-11 DIAGNOSIS — IMO0001 Reserved for inherently not codable concepts without codable children: Secondary | ICD-10-CM | POA: Diagnosis not present

## 2014-06-11 DIAGNOSIS — Z471 Aftercare following joint replacement surgery: Secondary | ICD-10-CM | POA: Diagnosis not present

## 2014-06-13 DIAGNOSIS — I10 Essential (primary) hypertension: Secondary | ICD-10-CM | POA: Diagnosis not present

## 2014-06-13 DIAGNOSIS — E785 Hyperlipidemia, unspecified: Secondary | ICD-10-CM | POA: Diagnosis not present

## 2014-06-13 DIAGNOSIS — Z471 Aftercare following joint replacement surgery: Secondary | ICD-10-CM | POA: Diagnosis not present

## 2014-06-13 DIAGNOSIS — Z96649 Presence of unspecified artificial hip joint: Secondary | ICD-10-CM | POA: Diagnosis not present

## 2014-06-13 DIAGNOSIS — IMO0001 Reserved for inherently not codable concepts without codable children: Secondary | ICD-10-CM | POA: Diagnosis not present

## 2014-06-15 NOTE — OR Nursing (Signed)
Late entry for type, subtype and infection under procedural recordings

## 2014-06-18 DIAGNOSIS — M25559 Pain in unspecified hip: Secondary | ICD-10-CM | POA: Diagnosis not present

## 2014-06-21 DIAGNOSIS — M25559 Pain in unspecified hip: Secondary | ICD-10-CM | POA: Diagnosis not present

## 2014-06-27 DIAGNOSIS — M25559 Pain in unspecified hip: Secondary | ICD-10-CM | POA: Diagnosis not present

## 2014-06-29 DIAGNOSIS — M25559 Pain in unspecified hip: Secondary | ICD-10-CM | POA: Diagnosis not present

## 2014-07-02 DIAGNOSIS — M25559 Pain in unspecified hip: Secondary | ICD-10-CM | POA: Diagnosis not present

## 2014-07-04 DIAGNOSIS — M25559 Pain in unspecified hip: Secondary | ICD-10-CM | POA: Diagnosis not present

## 2014-07-31 ENCOUNTER — Ambulatory Visit
Admission: RE | Admit: 2014-07-31 | Discharge: 2014-07-31 | Disposition: A | Discharge status: Home / Self Care | Payer: Medicare Other | Source: Ambulatory Visit | Attending: Family Medicine | Admitting: Family Medicine

## 2014-07-31 DIAGNOSIS — N281 Cyst of kidney, acquired: Secondary | ICD-10-CM | POA: Diagnosis not present

## 2014-07-31 DIAGNOSIS — Q619 Cystic kidney disease, unspecified: Secondary | ICD-10-CM

## 2014-07-31 DIAGNOSIS — Z86718 Personal history of other venous thrombosis and embolism: Secondary | ICD-10-CM

## 2014-07-31 DIAGNOSIS — I2699 Other pulmonary embolism without acute cor pulmonale: Secondary | ICD-10-CM

## 2014-07-31 HISTORY — DX: Other pulmonary embolism without acute cor pulmonale: I26.99

## 2014-07-31 HISTORY — DX: Personal history of other venous thrombosis and embolism: Z86.718

## 2014-08-05 ENCOUNTER — Inpatient Hospital Stay (HOSPITAL_COMMUNITY)
Admission: EM | Admit: 2014-08-05 | Discharge: 2014-08-08 | DRG: 176 | Disposition: A | Discharge status: Home Health | Payer: Medicare Other | Attending: Pulmonary Disease | Admitting: Pulmonary Disease

## 2014-08-05 ENCOUNTER — Inpatient Hospital Stay (HOSPITAL_COMMUNITY): Payer: Medicare Other

## 2014-08-05 ENCOUNTER — Emergency Department (HOSPITAL_COMMUNITY): Payer: Medicare Other

## 2014-08-05 ENCOUNTER — Encounter (HOSPITAL_COMMUNITY): Payer: Self-pay | Admitting: Emergency Medicine

## 2014-08-05 DIAGNOSIS — I10 Essential (primary) hypertension: Secondary | ICD-10-CM | POA: Diagnosis present

## 2014-08-05 DIAGNOSIS — Z96659 Presence of unspecified artificial knee joint: Secondary | ICD-10-CM

## 2014-08-05 DIAGNOSIS — Z87891 Personal history of nicotine dependence: Secondary | ICD-10-CM

## 2014-08-05 DIAGNOSIS — I2699 Other pulmonary embolism without acute cor pulmonale: Secondary | ICD-10-CM | POA: Diagnosis present

## 2014-08-05 DIAGNOSIS — I82409 Acute embolism and thrombosis of unspecified deep veins of unspecified lower extremity: Secondary | ICD-10-CM | POA: Diagnosis present

## 2014-08-05 DIAGNOSIS — I2789 Other specified pulmonary heart diseases: Secondary | ICD-10-CM | POA: Diagnosis present

## 2014-08-05 DIAGNOSIS — Z96649 Presence of unspecified artificial hip joint: Secondary | ICD-10-CM

## 2014-08-05 DIAGNOSIS — E785 Hyperlipidemia, unspecified: Secondary | ICD-10-CM | POA: Diagnosis present

## 2014-08-05 DIAGNOSIS — I714 Abdominal aortic aneurysm, without rupture, unspecified: Secondary | ICD-10-CM | POA: Diagnosis present

## 2014-08-05 DIAGNOSIS — R5381 Other malaise: Secondary | ICD-10-CM | POA: Diagnosis not present

## 2014-08-05 DIAGNOSIS — I82419 Acute embolism and thrombosis of unspecified femoral vein: Secondary | ICD-10-CM

## 2014-08-05 DIAGNOSIS — R5383 Other fatigue: Secondary | ICD-10-CM | POA: Diagnosis not present

## 2014-08-05 DIAGNOSIS — I059 Rheumatic mitral valve disease, unspecified: Secondary | ICD-10-CM | POA: Diagnosis not present

## 2014-08-05 DIAGNOSIS — M79609 Pain in unspecified limb: Secondary | ICD-10-CM | POA: Diagnosis not present

## 2014-08-05 DIAGNOSIS — M7989 Other specified soft tissue disorders: Secondary | ICD-10-CM | POA: Diagnosis not present

## 2014-08-05 DIAGNOSIS — Z832 Family history of diseases of the blood and blood-forming organs and certain disorders involving the immune mechanism: Secondary | ICD-10-CM | POA: Diagnosis not present

## 2014-08-05 DIAGNOSIS — I824Y9 Acute embolism and thrombosis of unspecified deep veins of unspecified proximal lower extremity: Secondary | ICD-10-CM

## 2014-08-05 DIAGNOSIS — M1612 Unilateral primary osteoarthritis, left hip: Secondary | ICD-10-CM

## 2014-08-05 DIAGNOSIS — Z09 Encounter for follow-up examination after completed treatment for conditions other than malignant neoplasm: Secondary | ICD-10-CM | POA: Diagnosis not present

## 2014-08-05 DIAGNOSIS — R0602 Shortness of breath: Secondary | ICD-10-CM | POA: Diagnosis not present

## 2014-08-05 DIAGNOSIS — Z6827 Body mass index (BMI) 27.0-27.9, adult: Secondary | ICD-10-CM

## 2014-08-05 LAB — I-STAT TROPONIN, ED: Troponin i, poc: 0.15 ng/mL (ref 0.00–0.08)

## 2014-08-05 LAB — BASIC METABOLIC PANEL
Anion gap: 15 (ref 5–15)
BUN: 20 mg/dL (ref 6–23)
CALCIUM: 9.1 mg/dL (ref 8.4–10.5)
CO2: 24 mEq/L (ref 19–32)
CREATININE: 0.97 mg/dL (ref 0.50–1.35)
Chloride: 101 mEq/L (ref 96–112)
GFR calc Af Amer: >90 mL/min (ref >90)
GFR calc non Af Amer: 83 mL/min — ABNORMAL LOW (ref >90)
GLUCOSE: 143 mg/dL — AB (ref 70–99)
Potassium: 3.9 mEq/L (ref 3.7–5.3)
SODIUM: 140 meq/L (ref 137–147)

## 2014-08-05 LAB — CBC
HCT: 45.6 % (ref 39.0–52.0)
HCT: 41.8 % (ref 39.0–52.0)
HEMOGLOBIN: 15.5 g/dL (ref 13.0–17.0)
Hemoglobin: 14.3 g/dL (ref 13.0–17.0)
MCH: 32.6 pg (ref 26.0–34.0)
MCH: 31.9 pg (ref 26.0–34.0)
MCHC: 34 g/dL (ref 30.0–36.0)
MCHC: 34.2 g/dL (ref 30.0–36.0)
MCV: 96 fL (ref 78.0–100.0)
MCV: 93.3 fL (ref 78.0–100.0)
Platelets: 133 10*3/uL — ABNORMAL LOW (ref 150–400)
Platelets: 130 10*3/uL — ABNORMAL LOW (ref 150–400)
RBC: 4.75 MIL/uL (ref 4.22–5.81)
RBC: 4.48 MIL/uL (ref 4.22–5.81)
RDW: 12.9 % (ref 11.5–15.5)
RDW: 12.9 % (ref 11.5–15.5)
WBC: 8.7 10*3/uL (ref 4.0–10.5)
WBC: 8.5 10*3/uL (ref 4.0–10.5)

## 2014-08-05 LAB — FIBRINOGEN
FIBRINOGEN: 443 mg/dL (ref 204–475)
Fibrinogen: 403 mg/dL (ref 204–475)

## 2014-08-05 LAB — MRSA PCR SCREENING: MRSA by PCR: NEGATIVE

## 2014-08-05 LAB — TROPONIN I: Troponin I: 0.47 ng/mL (ref <0.30)

## 2014-08-05 LAB — PRO B NATRIURETIC PEPTIDE: Pro B Natriuretic peptide (BNP): 1893 pg/mL — ABNORMAL HIGH (ref 0–125)

## 2014-08-05 LAB — GLUCOSE, CAPILLARY: GLUCOSE-CAPILLARY: 116 mg/dL — AB (ref 70–99)

## 2014-08-05 MED ORDER — TRAMADOL HCL 50 MG PO TABS: 50.0000 mg | ORAL_TABLET | Freq: Four times a day (QID) | ORAL | Status: DC | PRN

## 2014-08-05 MED ORDER — IOHEXOL 350 MG/ML SOLN
70.0000 mL | Freq: Once | INTRAVENOUS | Status: AC | PRN
  Administered 2014-08-05: 70 mL via INTRAVENOUS

## 2014-08-05 MED ORDER — SIMVASTATIN 20 MG PO TABS
20.0000 mg | ORAL_TABLET | Freq: Every day | ORAL | Status: DC
  Administered 2014-08-05 – 2014-08-07 (×3): 20 mg via ORAL
  Filled 2014-08-05 (×4): qty 1

## 2014-08-05 MED ORDER — SODIUM CHLORIDE 0.9 % IJ SOLN
3.0000 mL | Freq: Two times a day (BID) | INTRAMUSCULAR | Status: DC
  Administered 2014-08-05: 3 mL via INTRAVENOUS

## 2014-08-05 MED ORDER — SODIUM CHLORIDE 0.9 % IV SOLN: 12.0000 mg | Freq: Once | INTRAVENOUS | Status: DC

## 2014-08-05 MED ORDER — MELOXICAM 15 MG PO TABS
15.0000 mg | ORAL_TABLET | Freq: Every day | ORAL | Status: DC | PRN
  Filled 2014-08-05: qty 1

## 2014-08-05 MED ORDER — ALTEPLASE 50 MG IV SOLR: 12.0000 mg | Freq: Once | INTRAVENOUS | Status: DC

## 2014-08-05 MED ORDER — MIDAZOLAM HCL 2 MG/2ML IJ SOLN
INTRAMUSCULAR | Status: AC
  Filled 2014-08-05: qty 2

## 2014-08-05 MED ORDER — HEPARIN (PORCINE) IN NACL 100-0.45 UNIT/ML-% IJ SOLN
1600.0000 [IU]/h | INTRAMUSCULAR | Status: AC
  Administered 2014-08-05 – 2014-08-07 (×4): 1600 [IU]/h via INTRAVENOUS
  Filled 2014-08-05 (×6): qty 250

## 2014-08-05 MED ORDER — LIDOCAINE HCL 1 % IJ SOLN
INTRAMUSCULAR | Status: AC
  Filled 2014-08-05: qty 20

## 2014-08-05 MED ORDER — SODIUM CHLORIDE 0.9 % IV SOLN
INTRAVENOUS | Status: DC
  Administered 2014-08-05: 21:00:00 via INTRAVENOUS

## 2014-08-05 MED ORDER — OXYCODONE-ACETAMINOPHEN 5-325 MG PO TABS
1.0000 | ORAL_TABLET | ORAL | Status: DC | PRN
  Administered 2014-08-06: 1 via ORAL
  Filled 2014-08-05: qty 1

## 2014-08-05 MED ORDER — SODIUM CHLORIDE 0.9 % IV SOLN
INTRAVENOUS | Status: DC
  Administered 2014-08-06: 09:00:00 via INTRAVENOUS

## 2014-08-05 MED ORDER — IOHEXOL 300 MG/ML  SOLN
50.0000 mL | Freq: Once | INTRAMUSCULAR | Status: AC | PRN
  Administered 2014-08-05: 15 mL via INTRAVENOUS

## 2014-08-05 MED ORDER — SODIUM CHLORIDE 0.9 % IV SOLN: 250.0000 mL | INTRAVENOUS | Status: DC | PRN

## 2014-08-05 MED ORDER — HEPARIN BOLUS VIA INFUSION
4000.0000 [IU] | Freq: Once | INTRAVENOUS | Status: AC
  Administered 2014-08-05: 4000 [IU] via INTRAVENOUS
  Filled 2014-08-05: qty 4000

## 2014-08-05 MED ORDER — SODIUM CHLORIDE 0.9 % IV SOLN
1.0000 mg/h | Freq: Once | INTRAVENOUS | Status: AC
  Administered 2014-08-05: 1 mg/h via INTRAVENOUS
  Filled 2014-08-05: qty 12

## 2014-08-05 MED ORDER — FENTANYL CITRATE 0.05 MG/ML IJ SOLN
INTRAMUSCULAR | Status: AC | PRN
  Administered 2014-08-05: 25 ug via INTRAVENOUS
  Administered 2014-08-05: 50 ug via INTRAVENOUS

## 2014-08-05 MED ORDER — CYCLOBENZAPRINE HCL 5 MG PO TABS
5.0000 mg | ORAL_TABLET | Freq: Every day | ORAL | Status: DC | PRN
  Administered 2014-08-06: 5 mg via ORAL
  Filled 2014-08-05: qty 1

## 2014-08-05 MED ORDER — SODIUM CHLORIDE 0.9 % IJ SOLN: 3.0000 mL | INTRAMUSCULAR | Status: DC | PRN

## 2014-08-05 MED ORDER — FENTANYL CITRATE 0.05 MG/ML IJ SOLN
INTRAMUSCULAR | Status: AC
  Filled 2014-08-05: qty 2

## 2014-08-05 MED ORDER — MIDAZOLAM HCL 2 MG/2ML IJ SOLN
INTRAMUSCULAR | Status: AC | PRN
  Administered 2014-08-05: 1 mg via INTRAVENOUS
  Administered 2014-08-05: 0.5 mg via INTRAVENOUS

## 2014-08-05 NOTE — Progress Notes (Signed)
VASCULAR LAB PRELIMINARY  PRELIMINARY  PRELIMINARY  PRELIMINARY  Bilateral lower extremity venous Dopplers completed.    Preliminary report:  There is non occlusive, acute DVT noted in the bilateral Common femoral and femoral veins.  There is sluggish flow noted in the bilateral popliteal veins.  The bilateral posterior tibial veins appear dilated with poor color fill, cannot rule out non occlusive DVT.  Norm Wray, RVT 08/05/2014, 4:47 PM

## 2014-08-05 NOTE — Consult Note (Signed)
Reason for Consult: Chief Complaint: Chief Complaint  Patient presents with  . Shortness of Breath    Referring Physician(s): * No referring provider recorded for this case *  History of Present Illness:  Nicholas Hale is a 68 y.o. male With a two day history of increasing dyspnea upon exertion. He denies chest pain. He is S/P L THA 9 weeks ago without complication, and was placed on ASA. CTA demostrates PE with R heart strain. RV/LV ration 1.7. He denies, CVA, CPR, ulcers, Brain mets, recent surgery.   Past Medical History  Diagnosis Date  . Hypertension   . Hyperlipidemia   . Abdominal aortic aneurysm   . Arthritis     "hips; lower spine" (05/24/2014)    Past Surgical History  Procedure Laterality Date  . Hemiarthroplasty shoulder fracture Left 12/2008  . Appendectomy  1978  . Anterior fusion cervical spine  ~ 2005  . Colonoscopy    . Total hip arthroplasty Left 05/23/2014    Procedure: TOTAL HIP ARTHROPLASTY;  Surgeon: Kerin Salen, MD;  Location: [facility];  Service: Orthopedics;  Laterality: Left;  . Joint replacement    . Inguinal hernia repair Bilateral 1992    Allergies: Review of patient's allergies indicates no known allergies.  Medications: Prior to Admission medications   Medication Sig Start Date End Date Taking? Authorizing Provider  cyclobenzaprine (FLEXERIL) 5 MG tablet Take 5 mg by mouth daily as needed for muscle spasms.   Yes Historical Provider, MD  lisinopril-hydrochlorothiazide (PRINZIDE,ZESTORETIC) 20-12.5 MG per tablet Take 1 tablet by mouth daily.   Yes Historical Provider, MD  meloxicam (MOBIC) 15 MG tablet Take 15 mg by mouth daily as needed for pain.   Yes Historical Provider, MD  oxyCODONE-acetaminophen (ROXICET) 5-325 MG per tablet Take 1 tablet by mouth every 4 (four) hours as needed. 05/23/14  Yes Leighton Parody, PA-C  pravastatin (PRAVACHOL) 40 MG tablet Take 40 mg by mouth daily.   Yes Historical Provider, MD  traMADol (ULTRAM) 50 MG  tablet Take 50 mg by mouth every 6 (six) hours as needed.   Yes Historical Provider, MD    Family History  Problem Relation Age of Onset  . Clotting disorder Mother   . Diabetes Mellitus I Mother   . Prostate cancer Father     History   Social History  . Marital Status: Married    Spouse Name: N/A    Number of Children: N/A  . Years of Education: N/A   Social History Main Topics  . Smoking status: Former Smoker -- 0.50 packs/day for 40 years    Types: Cigarettes  . Smokeless tobacco: Never Used     Comment: "quit smoking in 2013"  . Alcohol Use: No  . Drug Use: No  . Sexual Activity: No   Other Topics Concern  . None   Social History Narrative  . None    ECOG Status: 1 - Symptomatic but completely ambulatory  Review of Systems: A 12 point ROS discussed and pertinent positives are indicated in the HPI above.  All other systems are negative.  Review of Systems  Constitutional: Negative.   HENT: Negative.   Respiratory: Positive for shortness of breath. Negative for chest tightness, wheezing and stridor.     Vital Signs: BP 123/60  Pulse 88  Temp(Src) 98.2 F (36.8 C) (Oral)  Resp 23  Ht 5\' 11"  (1.803 m)  Wt 199 lb 1 oz (90.294 kg)  BMI 27.78 kg/m2  SpO2 97%  Physical Exam  Constitutional: He is oriented to person, place, and time. He appears well-developed and well-nourished.  HENT:  Head: Normocephalic and atraumatic.  Neck: Normal range of motion. Neck supple.  Cardiovascular: Normal rate, regular rhythm and normal heart sounds.   Pulmonary/Chest: Effort normal and breath sounds normal.  Abdominal: Soft.  Neurological: He is alert and oriented to person, place, and time.    Imaging: Ct Angio Chest W/cm &/or Wo Cm  08/05/2014   CLINICAL DATA:  Shortness of breath with exertion.  Left leg pain.  EXAM: CT ANGIOGRAPHY CHEST WITH CONTRAST  TECHNIQUE: Multidetector CT imaging of the chest was performed using the standard protocol during bolus  administration of intravenous contrast. Multiplanar CT image reconstructions and MIPs were obtained to evaluate the vascular anatomy.  CONTRAST:  83mL OMNIPAQUE IOHEXOL 350 MG/ML SOLN  COMPARISON:  Chest x-ray earlier today.  FINDINGS: There is evidence of submassive bilateral pulmonary embolism. On the left, tubular thrombus is identified in the main pulmonary artery that branches into upper lobe and lower lobe pulmonary arteries. Thrombus in the upper lobe pulmonary artery is occlusive. Thrombus in the lower lobe pulmonary artery is nonocclusive with near occlusive component in the posterior basilar left lower lobe. Some occlusive thrombus is seen in a superior lingular branch. Inferior lingular clot is nonocclusive. On the right side, thrombus is identified in the distal right main pulmonary artery. Nonocclusive thrombus extends into the middle lobe and lower lobe with lower lobe thrombus nearly occlusive. Thrombus in the right upper lobe is nonocclusive.  Associated evidence of right heart strain by CTA with estimated RV/LV ratio 1.73. No associated overt pulmonary edema or pleural fluid. Patchy areas of parenchymal opacity in the peripheral left lower lobe, inferior lingula and right upper lobe likely relate to changes secondary to pulmonary embolism and may even represent some areas of early pulmonary infarct, especially in the left lower lobe.  No pericardial fluid is identified. No masses or enlarged lymph nodes. The upper abdomen shows steatosis of the liver. The bony thorax is unremarkable.  Review of the MIP images confirms the above findings.  IMPRESSION: Submassive bilateral acute pulmonary embolism with significant clot burden bilaterally. There is evidence of right heart strain with estimated RV/LV ratio of 1.73.  Positive for acute PE with CT evidence of right heart strain (RV/LV Ratio = 1.73) consistent with at least submassive (intermediate risk) PE. The presence of right heart strain has been  associated with an increased risk of morbidity and mortality. Consultation with Pulmonary and Critical Care Medicine is recommended.  Findings were discussed with Dr. Venora Maples by telephone.   Electronically Signed   By: Aletta Edouard M.D.   On: 08/05/2014 16:37   US Renal  07/31/2014   CLINICAL DATA:  History of renal cysts  EXAM: RENAL/URINARY TRACT ULTRASOUND COMPLETE  COMPARISON:  Ultrasound of the abdomen of 03/26/2014  FINDINGS: Right Kidney:  Length: 11.0 cm. Several right renal cysts are present. The largest cyst is septated emanating from the lower pole measuring 9.8 x 6.4 x 12.0 cm compared to prior maximum diameter of 12.0 cm.  Left Kidney:  Length: 10.6 cm.  No hydronephrosis is seen.  Bladder:  The urinary bladder is unremarkable.  IMPRESSION: Stable large lower pole right renal cyst of 12 cm in maximum diameter with septation. No other abnormality.   Electronically Signed   By: Ivar Drape M.D.   On: 07/31/2014 14:07   Dg Chest Portable 1 View  08/05/2014   CLINICAL  DATA:  Shortness of breath 1 week.  EXAM: PORTABLE CHEST - 1 VIEW  COMPARISON:  08/05/2014 and 05/17/2014  FINDINGS: Lordotic technique is demonstrated. There is mild focal opacification over the lateral left base as cannot exclude atelectasis versus infection. Cardiomediastinal silhouette and remainder of the exam is unchanged.  IMPRESSION: Opacification over the lateral left base which may be due to atelectasis versus infection.   Electronically Signed   By: Marin Olp M.D.   On: 08/05/2014 15:18    Labs: Lab Results  Component Value Date   WBC 8.7 08/05/2014   HCT 45.6 08/05/2014   MCV 96.0 08/05/2014   PLT 133* 08/05/2014   NA 140 08/05/2014   K 3.9 08/05/2014   CL 101 08/05/2014   CO2 24 08/05/2014   GLUCOSE 143* 08/05/2014   BUN 20 08/05/2014   CREATININE 0.97 08/05/2014   CALCIUM 9.1 08/05/2014   GFRNONAA 83* 08/05/2014   GFRAA >90 08/05/2014   INR 0.95 05/17/2014    Assessment and Plan: PE with evidence of right heart strain. Will  proceed with PE lysis.    I spent a total of 30 minutes face to face in clinical consultation, greater than 50% of which was counseling/coordinating care for intervention.  Thank you for this interesting consult.  I greatly enjoyed meeting Nicholas Hale and look forward to participating in their care.

## 2014-08-05 NOTE — ED Notes (Signed)
Abnormal labs given to DR. [employer]

## 2014-08-05 NOTE — Procedures (Signed)
B PE EKOS lysis R MAP 37 mmHg (pre) L MAP 33 mmHg (pre) No comp

## 2014-08-05 NOTE — H&P (Signed)
PULMONARY / CRITICAL CARE MEDICINE  Name: Nicholas Hale MRN: [id number] DOB: [birth year]    ADMISSION DATE:  08/05/2014 CONSULTATION DATE:  08/05/2014  REFERRING MD :  EDP PRIMARY SERVICE:  PCCM  CHIEF COMPLAINT:    BRIEF PATIENT DESCRIPTION: 68 yo with total left knee replacement done in June of 2015 admitted with bilateral DVTs and PE, considered for catheter-guided thrombolytics.  SIGNIFICANT EVENTS / STUDIES:  9/6  Bilateral venous Doppler LE >>> PRELINIMARY bilateral DVT 9/6  Chest CTA >>> Submassive bilateral acute pulmonary embolism with significant clot burden and evidence of R heart strain with RV/LV ratio of 1.73. 9/?  TTE >>>  HISTORY OF PRESENT ILLNESS:  68 yo with AAA, total left knee replacement in June of 2015 and family history of clotting disorder presented 9/6 with progressive exertional dyspnea x 5 days and L thigh pain. Venous Doppler revealed DVT in BL LE.  Chest CTA was positive for submassive bilateral acute pulmonary embolism with significant clot burden and evidence of R heart strain with RV/LV ratio of 1.73.  Troponin was mildly elevated at 0.47. The patient was considered for catheter directed lysis by IR and PCCM was asked to admit to ICU.  Heparin gtt was started.  PAST MEDICAL HISTORY :  Past Medical History  Diagnosis Date  . Hypertension   . Hyperlipidemia   . Abdominal aortic aneurysm   . Arthritis     "hips; lower spine" (05/24/2014)   Past Surgical History  Procedure Laterality Date  . Hemiarthroplasty shoulder fracture Left 12/2008  . Appendectomy  1978  . Anterior fusion cervical spine  ~ 2005  . Colonoscopy    . Total hip arthroplasty Left 05/23/2014    Procedure: TOTAL HIP ARTHROPLASTY;  Surgeon: Kerin Salen, MD;  Location: [facility];  Service: Orthopedics;  Laterality: Left;  . Joint replacement    . Inguinal hernia repair Bilateral 1992   Prior to Admission medications   Medication Sig Start Date End Date Taking? Authorizing Provider   cyclobenzaprine (FLEXERIL) 5 MG tablet Take 5 mg by mouth daily as needed for muscle spasms.   Yes Historical Provider, MD  lisinopril-hydrochlorothiazide (PRINZIDE,ZESTORETIC) 20-12.5 MG per tablet Take 1 tablet by mouth daily.   Yes Historical Provider, MD  meloxicam (MOBIC) 15 MG tablet Take 15 mg by mouth daily as needed for pain.   Yes Historical Provider, MD  oxyCODONE-acetaminophen (ROXICET) 5-325 MG per tablet Take 1 tablet by mouth every 4 (four) hours as needed. 05/23/14  Yes Leighton Parody, PA-C  pravastatin (PRAVACHOL) 40 MG tablet Take 40 mg by mouth daily.   Yes Historical Provider, MD  traMADol (ULTRAM) 50 MG tablet Take 50 mg by mouth every 6 (six) hours as needed.   Yes Historical Provider, MD   No Known Allergies  FAMILY HISTORY:  Family History  Problem Relation Age of Onset  . Clotting disorder Mother   . Diabetes Mellitus I Mother   . Prostate cancer Father    SOCIAL HISTORY:  reports that he has quit smoking. His smoking use included Cigarettes. He has a 20 pack-year smoking history. He has never used smokeless tobacco. He reports that he does not drink alcohol or use illicit drugs.  REVIEW OF SYSTEMS:  Unable to provide.  INTERVAL HISTORY:  VITAL SIGNS: Temp:  [98.2 F (36.8 C)] 98.2 F (36.8 C) (09/06 1302) Pulse Rate:  [81-88] 81 (09/06 1500) Resp:  [13-22] 22 (09/06 1500) BP: (88-140)/(51-71) 139/51 mmHg (09/06 1500) SpO2:  [96 %-98 %]  96 % (09/06 1500) Weight:  [90.294 kg (199 lb 1 oz)] 90.294 kg (199 lb 1 oz) (09/06 1302)  HEMODYNAMICS:   VENTILATOR SETTINGS:   INTAKE / OUTPUT: Intake/Output   None    PHYSICAL EXAMINATION: General:  No distress Neuro:  Awake, alert, cooperative HEENT:  PERRL Cardiovascular:  RRR, no m/r/g Lungs:  Bilateral diminished air entry, no w/r/r Abdomen:  Soft, nontender, bowel sounds diminished Musculoskeletal:  Moves all extremities, no edema, healed surgical incision L hip Skin:  Intact  LABS: CBC  Recent  Labs Lab 08/05/14 1323  WBC 8.7  HGB 15.5  HCT 45.6  PLT 133*   Coag's No results found for this basename: APTT, INR,  in the last 168 hours BMET  Recent Labs Lab 08/05/14 1323  NA 140  K 3.9  CL 101  CO2 24  BUN 20  CREATININE 0.97  GLUCOSE 143*   Electrolytes  Recent Labs Lab 08/05/14 1323  CALCIUM 9.1   Sepsis Markers No results found for this basename: LATICACIDVEN, PROCALCITON, O2SATVEN,  in the last 168 hours ABG No results found for this basename: PHART, PCO2ART, PO2ART,  in the last 168 hours Liver Enzymes No results found for this basename: AST, ALT, ALKPHOS, BILITOT, ALBUMIN,  in the last 168 hours Cardiac Enzymes  Recent Labs Lab 08/05/14 1323 08/05/14 1434  TROPONINI  --  0.47*  PROBNP 1893.0*  --    Glucose No results found for this basename: GLUCAP,  in the last 168 hours  IMAGING: Ct Angio Chest W/cm &/or Wo Cm  08/05/2014   CLINICAL DATA:  Shortness of breath with exertion.  Left leg pain.  EXAM: CT ANGIOGRAPHY CHEST WITH CONTRAST  TECHNIQUE: Multidetector CT imaging of the chest was performed using the standard protocol during bolus administration of intravenous contrast. Multiplanar CT image reconstructions and MIPs were obtained to evaluate the vascular anatomy.  CONTRAST:  59mL OMNIPAQUE IOHEXOL 350 MG/ML SOLN  COMPARISON:  Chest x-ray earlier today.  FINDINGS: There is evidence of submassive bilateral pulmonary embolism. On the left, tubular thrombus is identified in the main pulmonary artery that branches into upper lobe and lower lobe pulmonary arteries. Thrombus in the upper lobe pulmonary artery is occlusive. Thrombus in the lower lobe pulmonary artery is nonocclusive with near occlusive component in the posterior basilar left lower lobe. Some occlusive thrombus is seen in a superior lingular branch. Inferior lingular clot is nonocclusive. On the right side, thrombus is identified in the distal right main pulmonary artery. Nonocclusive  thrombus extends into the middle lobe and lower lobe with lower lobe thrombus nearly occlusive. Thrombus in the right upper lobe is nonocclusive.  Associated evidence of right heart strain by CTA with estimated RV/LV ratio 1.73. No associated overt pulmonary edema or pleural fluid. Patchy areas of parenchymal opacity in the peripheral left lower lobe, inferior lingula and right upper lobe likely relate to changes secondary to pulmonary embolism and may even represent some areas of early pulmonary infarct, especially in the left lower lobe.  No pericardial fluid is identified. No masses or enlarged lymph nodes. The upper abdomen shows steatosis of the liver. The bony thorax is unremarkable.  Review of the MIP images confirms the above findings.  IMPRESSION: Submassive bilateral acute pulmonary embolism with significant clot burden bilaterally. There is evidence of right heart strain with estimated RV/LV ratio of 1.73.  Positive for acute PE with CT evidence of right heart strain (RV/LV Ratio = 1.73) consistent with at least submassive (intermediate risk)  PE. The presence of right heart strain has been associated with an increased risk of morbidity and mortality. Consultation with Pulmonary and Critical Care Medicine is recommended.  Findings were discussed with Dr. Venora Maples by telephone.   Electronically Signed   By: Aletta Edouard M.D.   On: 08/05/2014 16:37   Dg Chest Portable 1 View  08/05/2014   CLINICAL DATA:  Shortness of breath 1 week.  EXAM: PORTABLE CHEST - 1 VIEW  COMPARISON:  08/05/2014 and 05/17/2014  FINDINGS: Lordotic technique is demonstrated. There is mild focal opacification over the lateral left base as cannot exclude atelectasis versus infection. Cardiomediastinal silhouette and remainder of the exam is unchanged.  IMPRESSION: Opacification over the lateral left base which may be due to atelectasis versus infection.   Electronically Signed   By: Marin Olp M.D.   On: 08/05/2014 15:18    ASSESSMENT / PLAN:  Acute bilateral DVT LE, likely provoked by recent hip surgery Bilateral submassive pulmonary embolism with large clot burden and evidence of R heart strain No significant hypoxia and no hemodynamic instability at this time Family history of clotting disorder Osteoarthritis / recent total L hip replacement ( June 2015 )   Admit to ICU  Heparin gtt  No indications for systemic thrombolytics at this time  IR to evaluate for catheter-guided US-assisted lysis  TTE  Follow formal Doppler results  Fibrinogen, factor V leiden, lupus anticoagulant, antiphospholipid ab  Continue preadmission Rx, except HCTZ / Lisinopril as at risk for hypotension  I have personally obtained history, examined patient, evaluated and interpreted laboratory and imaging results, reviewed medical records, formulated assessment / plan and placed orders.  CRITICAL CARE:  The patient is critically ill with multiple organ systems failure and requires high complexity decision making for assessment and support, frequent evaluation and titration of therapies, application of advanced monitoring technologies and extensive interpretation of multiple databases. Critical Care Time devoted to patient care services described in this note is 35 minutes.   Doree Fudge, MD Pulmonary and [place] Pager: [phone]  08/05/2014, 5:30 PM

## 2014-08-05 NOTE — ED Notes (Signed)
Handoff given to IR staff

## 2014-08-05 NOTE — ED Notes (Signed)
stepped out of room. When I returned to room. Patient pale, C/O felling " BAD" retook B/P 140/71. Spoke with Triage RN. Went to clean a bed in back for him. When I returned his B/P was 88/60 sitting. Patient moved to room A5. RN in room with patient.

## 2014-08-05 NOTE — ED Notes (Signed)
Pt. Belongings with family.

## 2014-08-05 NOTE — ED Notes (Addendum)
Pt was sent [facility] for r/o PE/DVT. hes been increasingly SOB on exertion with L upper leg pain over past few weeks. He did have a L hip replacement in June. He denies CP. [facility] did a CXR and labs. hes A&Ox4, resp e/u now.

## 2014-08-05 NOTE — Progress Notes (Addendum)
LT PA pressure 58/24 mean 36,  RT PA pressure 65/27 mean 37

## 2014-08-05 NOTE — Progress Notes (Signed)
ANTICOAGULATION CONSULT NOTE - Initial Consult  Pharmacy Consult for Heparin Indication: pulmonary embolus  No Known Allergies  Patient Measurements: Height: 5\' 11"  (180.3 cm) Weight: 199 lb 1 oz (90.294 kg) IBW/kg (Calculated) : 75.3 Heparin Dosing Weight: 90.3 kg  Vital Signs: Temp: 98.2 F (36.8 C) (09/06 1302) Temp src: Oral (09/06 1302) BP: 139/51 mmHg (09/06 1500) Pulse Rate: 81 (09/06 1500)  Labs:  Recent Labs  08/05/14 1323 08/05/14 1434  HGB 15.5  --   HCT 45.6  --   PLT 133*  --   CREATININE 0.97  --   TROPONINI  --  0.47*    Estimated Creatinine Clearance: 77.6 ml/min (by C-G formula based on Cr of 0.97).   Medical History: Past Medical History  Diagnosis Date  . Hypertension   . Hyperlipidemia   . Abdominal aortic aneurysm   . Arthritis     "hips; lower spine" (05/24/2014)    Medications:  See med rec  Assessment: SOB with exertion with L leg pain (s/p THR 6/15) Troponins elevated, proBNP 1893. Scr 0.97. CBC WNL 9/6 CT: Submassive bilateral acute pulmonary embolism with significant clot burden bilaterally. There is evidence of right heart strain    Goal of Therapy:  Heparin level 0.3-0.7 units/ml Monitor platelets by anticoagulation protocol: Yes   Plan:  Heparin 4000 unit IV bolus Heparin infusion at 1600 Units/hr Check heparin level in 8 hrs Daily heparin level and CBC   Masaki Rothbauer S. Alford Highland, PharmD, BCPS Clinical Staff Pharmacist Pager [phone]  Eilene Ghazi Stillinger 08/05/2014,4:33 PM

## 2014-08-05 NOTE — ED Provider Notes (Addendum)
CSN: [id number]     Arrival date & time 08/05/14  1228 History   First MD Initiated Contact with Patient 08/05/14 1400     Chief Complaint  Patient presents with  . Shortness of Breath      The history is provided by the patient.   Patient reports increasing exertional shortness of breath over the past several days.  He had total left knee placed on 05/23/2014.  He was reporting some left thigh pain 5-6 days ago but reports that pain is now improved.  Denies significant swelling of his legs.  He reports he had an episode 2 days ago of exertional shortness of breath when walking to the mailbox followed by an episode of extreme shortness of breath when trying to move a lawn yesterday.  He initially went to his primary care office today who recommended that he come to the ER for evaluation no prior cardiac disease.  He does have a history of abdominal aortic aneurysm.  He denies abdominal pain.  He has hypertension, hyperlipidemia.  He used to smoke cigarettes but no longer does.  No history of asthma, COPD, emphysema.  Denies productive cough.  No fevers or chills   Past Medical History  Diagnosis Date  . Hypertension   . Hyperlipidemia   . Abdominal aortic aneurysm   . Arthritis     "hips; lower spine" (05/24/2014)   Past Surgical History  Procedure Laterality Date  . Hemiarthroplasty shoulder fracture Left 12/2008  . Appendectomy  1978  . Anterior fusion cervical spine  ~ 2005  . Colonoscopy    . Total hip arthroplasty Left 05/23/2014    Procedure: TOTAL HIP ARTHROPLASTY;  Surgeon: Kerin Salen, MD;  Location: [place];  Service: Orthopedics;  Laterality: Left;  . Joint replacement    . Inguinal hernia repair Bilateral 1992   Family History  Problem Relation Age of Onset  . Clotting disorder Mother   . Diabetes Mellitus I Mother   . Prostate cancer Father    History  Substance Use Topics  . Smoking status: Former Smoker -- 0.50 packs/day for 40 years    Types: Cigarettes  .  Smokeless tobacco: Never Used     Comment: "quit smoking in 2013"  . Alcohol Use: No    Review of Systems  All other systems reviewed and are negative.     Allergies  Review of patient's allergies indicates no known allergies.  Home Medications   Prior to Admission medications   Medication Sig Start Date End Date Taking? Authorizing Provider  cyclobenzaprine (FLEXERIL) 5 MG tablet Take 5 mg by mouth daily as needed for muscle spasms.   Yes Historical Provider, MD  lisinopril-hydrochlorothiazide (PRINZIDE,ZESTORETIC) 20-12.5 MG per tablet Take 1 tablet by mouth daily.   Yes Historical Provider, MD  meloxicam (MOBIC) 15 MG tablet Take 15 mg by mouth daily as needed for pain.   Yes Historical Provider, MD  oxyCODONE-acetaminophen (ROXICET) 5-325 MG per tablet Take 1 tablet by mouth every 4 (four) hours as needed. 05/23/14  Yes Leighton Parody, PA-C  pravastatin (PRAVACHOL) 40 MG tablet Take 40 mg by mouth daily.   Yes Historical Provider, MD  traMADol (ULTRAM) 50 MG tablet Take 50 mg by mouth every 6 (six) hours as needed.   Yes Historical Provider, MD   BP 139/51  Pulse 81  Temp(Src) 98.2 F (36.8 C) (Oral)  Resp 22  Ht 5\' 11"  (1.803 m)  Wt 199 lb 1 oz (90.294 kg)  BMI 27.78 kg/m2  SpO2 96% Physical Exam  Nursing note and vitals reviewed. Constitutional: He is oriented to person, place, and time. He appears well-developed and well-nourished.  HENT:  Head: Normocephalic and atraumatic.  Eyes: EOM are normal.  Neck: Normal range of motion.  Cardiovascular: Normal rate, regular rhythm, normal heart sounds and intact distal pulses.   Pulmonary/Chest: Effort normal and breath sounds normal. No respiratory distress.  Abdominal: Soft. He exhibits no distension. There is no tenderness.  Musculoskeletal: Normal range of motion.  Neurological: He is alert and oriented to person, place, and time.  Skin: Skin is warm and dry.  Psychiatric: He has a normal mood and affect. Judgment  normal.    ED Course  Procedures (including critical care time)  CRITICAL CARE Performed by: Hoy Morn Total critical care time: 35 Critical care time was exclusive of separately billable procedures and treating other patients. Critical care was necessary to treat or prevent imminent or life-threatening deterioration. Critical care was time spent personally by me on the following activities: development of treatment plan with patient and/or surrogate as well as nursing, discussions with consultants, evaluation of patient's response to treatment, examination of patient, obtaining history from patient or surrogate, ordering and performing treatments and interventions, ordering and review of laboratory studies, ordering and review of radiographic studies, pulse oximetry and re-evaluation of patient's condition.  Labs Review Labs Reviewed  CBC - Abnormal; Notable for the following:    Platelets 133 (*)    All other components within normal limits  BASIC METABOLIC PANEL - Abnormal; Notable for the following:    Glucose, Bld 143 (*)    GFR calc non Af Amer 83 (*)    All other components within normal limits  PRO B NATRIURETIC PEPTIDE - Abnormal; Notable for the following:    Pro B Natriuretic peptide (BNP) 1893.0 (*)    All other components within normal limits  TROPONIN I - Abnormal; Notable for the following:    Troponin I 0.47 (*)    All other components within normal limits  I-STAT TROPOININ, ED - Abnormal; Notable for the following:    Troponin i, poc 0.15 (*)    All other components within normal limits  HEPARIN LEVEL (UNFRACTIONATED)    Imaging Review Ct Angio Chest W/cm &/or Wo Cm  08/05/2014   CLINICAL DATA:  Shortness of breath with exertion.  Left leg pain.  EXAM: CT ANGIOGRAPHY CHEST WITH CONTRAST  TECHNIQUE: Multidetector CT imaging of the chest was performed using the standard protocol during bolus administration of intravenous contrast. Multiplanar CT image  reconstructions and MIPs were obtained to evaluate the vascular anatomy.  CONTRAST:  81mL OMNIPAQUE IOHEXOL 350 MG/ML SOLN  COMPARISON:  Chest x-ray earlier today.  FINDINGS: There is evidence of submassive bilateral pulmonary embolism. On the left, tubular thrombus is identified in the main pulmonary artery that branches into upper lobe and lower lobe pulmonary arteries. Thrombus in the upper lobe pulmonary artery is occlusive. Thrombus in the lower lobe pulmonary artery is nonocclusive with near occlusive component in the posterior basilar left lower lobe. Some occlusive thrombus is seen in a superior lingular branch. Inferior lingular clot is nonocclusive. On the right side, thrombus is identified in the distal right main pulmonary artery. Nonocclusive thrombus extends into the middle lobe and lower lobe with lower lobe thrombus nearly occlusive. Thrombus in the right upper lobe is nonocclusive.  Associated evidence of right heart strain by CTA with estimated RV/LV ratio 1.73. No associated overt  pulmonary edema or pleural fluid. Patchy areas of parenchymal opacity in the peripheral left lower lobe, inferior lingula and right upper lobe likely relate to changes secondary to pulmonary embolism and may even represent some areas of early pulmonary infarct, especially in the left lower lobe.  No pericardial fluid is identified. No masses or enlarged lymph nodes. The upper abdomen shows steatosis of the liver. The bony thorax is unremarkable.  Review of the MIP images confirms the above findings.  IMPRESSION: Submassive bilateral acute pulmonary embolism with significant clot burden bilaterally. There is evidence of right heart strain with estimated RV/LV ratio of 1.73.  Positive for acute PE with CT evidence of right heart strain (RV/LV Ratio = 1.73) consistent with at least submassive (intermediate risk) PE. The presence of right heart strain has been associated with an increased risk of morbidity and mortality.  Consultation with Pulmonary and Critical Care Medicine is recommended.  Findings were discussed with Dr. Venora Maples by telephone.   Electronically Signed   By: Aletta Edouard M.D.   On: 08/05/2014 16:37   Dg Chest Portable 1 View  08/05/2014   CLINICAL DATA:  Shortness of breath 1 week.  EXAM: PORTABLE CHEST - 1 VIEW  COMPARISON:  08/05/2014 and 05/17/2014  FINDINGS: Lordotic technique is demonstrated. There is mild focal opacification over the lateral left base as cannot exclude atelectasis versus infection. Cardiomediastinal silhouette and remainder of the exam is unchanged.  IMPRESSION: Opacification over the lateral left base which may be due to atelectasis versus infection.   Electronically Signed   By: Marin Olp M.D.   On: 08/05/2014 15:18  I personally reviewed the imaging tests through PACS system I reviewed available ER/hospitalization records through the EMR    EKG Interpretation   Date/Time:  Sunday August 05 2014 13:00:27 EDT Ventricular Rate:  95 PR Interval:  160 QRS Duration: 88 QT Interval:  336 QTC Calculation: 422 R Axis:   74 Text Interpretation:  Normal sinus rhythm Cannot rule out Inferior infarct  , age undetermined Abnormal ECG No significant change was found Confirmed  by Emberlie Gotcher  MD, Lennette Bihari ([id number]) on 08/05/2014 2:33:30 PM      MDM   Final diagnoses:  Bilateral pulmonary embolism    Patient large bilateral pulmonary emboli.  Started on heparin drip.  Emergency department.  Radiographically he has evidence of right heart strain.  He has an elevated BNP is elevated troponin.  No hypoxia at this time.  Based on the findings on his CT scan he is likely a good candidate for catheter directed lysis.  He shall be admitted to the intensive care unit.  He will continue on his heparin.  Interventional radiology consultation at the bedside will be had today for catheter directed lysis.    Hoy Morn, MD 08/05/14 [place], MD 08/10/14 2219

## 2014-08-06 ENCOUNTER — Inpatient Hospital Stay (HOSPITAL_COMMUNITY): Payer: Medicare Other

## 2014-08-06 ENCOUNTER — Encounter (HOSPITAL_COMMUNITY): Payer: Self-pay | Admitting: *Deleted

## 2014-08-06 LAB — CBC
HCT: 39.9 % (ref 39.0–52.0)
HEMATOCRIT: 40.9 % (ref 39.0–52.0)
HEMOGLOBIN: 14 g/dL (ref 13.0–17.0)
Hemoglobin: 13.6 g/dL (ref 13.0–17.0)
MCH: 31.7 pg (ref 26.0–34.0)
MCH: 32.5 pg (ref 26.0–34.0)
MCHC: 33.3 g/dL (ref 30.0–36.0)
MCHC: 35.1 g/dL (ref 30.0–36.0)
MCV: 95.3 fL (ref 78.0–100.0)
MCV: 92.6 fL (ref 78.0–100.0)
Platelets: 120 10*3/uL — ABNORMAL LOW (ref 150–400)
Platelets: 113 10*3/uL — ABNORMAL LOW (ref 150–400)
RBC: 4.29 MIL/uL (ref 4.22–5.81)
RBC: 4.31 MIL/uL (ref 4.22–5.81)
RDW: 12.9 % (ref 11.5–15.5)
RDW: 12.8 % (ref 11.5–15.5)
WBC: 8.2 10*3/uL (ref 4.0–10.5)
WBC: 7.9 10*3/uL (ref 4.0–10.5)

## 2014-08-06 LAB — FIBRINOGEN
Fibrinogen: 417 mg/dL (ref 204–475)
Fibrinogen: 435 mg/dL (ref 204–475)

## 2014-08-06 LAB — HEPARIN LEVEL (UNFRACTIONATED)
Heparin Unfractionated: 0.44 [IU]/mL (ref 0.30–0.70)
Heparin Unfractionated: 0.42 IU/mL (ref 0.30–0.70)

## 2014-08-06 MED ORDER — CETYLPYRIDINIUM CHLORIDE 0.05 % MT LIQD
7.0000 mL | Freq: Two times a day (BID) | OROMUCOSAL | Status: DC
  Administered 2014-08-06: 7 mL via OROMUCOSAL

## 2014-08-06 NOTE — Sedation Documentation (Signed)
Pt down for PE Lysis follow up, states feeling much better.  Sat 96% on RA.  Dr Barbie Banner and Dr Pascal Lux in to speak to pt.

## 2014-08-06 NOTE — Progress Notes (Addendum)
ANTICOAGULATION CONSULT NOTE Pharmacy Consult for Heparin Indication: pulmonary embolus  No Known Allergies  Patient Measurements: Height: 5\' 11"  (180.3 cm) Weight: 199 lb 15.3 oz (90.7 kg) IBW/kg (Calculated) : 75.3 Heparin Dosing Weight: 90.3 kg  Vital Signs: Temp: 98.2 F (36.8 C) (09/07 0000) Temp src: Oral (09/06 2034) BP: 119/64 mmHg (09/07 0130) Pulse Rate: 79 (09/07 0130)  Labs:  Recent Labs  08/05/14 1323 08/05/14 1434 08/05/14 2206 08/06/14 0049  HGB 15.5  --  14.3  --   HCT 45.6  --  41.8  --   PLT 133*  --  130*  --   HEPARINUNFRC  --   --   --  0.44  CREATININE 0.97  --   --   --   TROPONINI  --  0.47*  --   --     Estimated Creatinine Clearance: 84 ml/min (by C-G formula based on Cr of 0.97).   Assessment:  68 yo male with submassive PE, on catheter-guided tPA, for heparin   Goal of Therapy:  Heparin level 0.3-0.7 units/ml Monitor platelets by anticoagulation protocol: Yes   Plan:  Continue Heparin at current rate  Follow-up am labs.  Abbott, Bronson Curb 08/06/2014,2:40 AM    Addendum -tPA was completed at 0800 -Heparin was turned off ~1030 for sheath removal -HL therapeutic x2 at 1600 units/hr -Continue heparin gtt at 1600 units/hr after sheath removal  -Daily HL, CBC -Monitor for s/sx bleeding  Hughes Better, PharmD, BCPS Clinical Pharmacist Pager: [phone] 08/06/2014 10:50 AM

## 2014-08-06 NOTE — Plan of Care (Signed)
Problem: Consults Goal: Diagnosis - Venous Thromboembolism (VTE) Choose a selection Outcome: Completed/Met Date Met:  08/06/14 PE (Pulmonary Embolism) & DVT (Deep Vein Thrombosis)

## 2014-08-06 NOTE — Procedures (Signed)
Technically successful USAT of the bilateral pulmonary arteries. Right PA mean arterial pressure decreased from 37 to 24 (improvement of 13 mmHg) Left PA mean arterial pressure decreased from 33 to 19 (improvement of 14 mmHg). R groin vascular sheaths removed and hemostasis achieved with manual compression. No immediate post procedural complications.

## 2014-08-06 NOTE — Sedation Documentation (Signed)
Right PA pressure 41/14 mean 24 Left PA pressure 29/14 mean 19

## 2014-08-06 NOTE — Progress Notes (Signed)
Right pulmonary pressure 41/14 mean 24 Left pulmonary pressure  29/14 mean 19

## 2014-08-06 NOTE — Progress Notes (Signed)
No new complaints No distress  Filed Vitals:   08/06/14 1500 08/06/14 1541 08/06/14 1600 08/06/14 1700  BP: 136/66  126/62 137/60  Pulse: 79  72 74  Temp:  98.2 F (36.8 C)    TempSrc:  Oral    Resp: 19  22 21   Height:      Weight:      SpO2: 96%  99% 96%   NAD HEENT WNL No JVD noted Chest clear RRR s M NABS No edema Neuro intact  I have reviewed all of today's lab results. Relevant abnormalities are discussed in the A/P section  No new CXR   IMP: Acute submassive PE c/b pulm hypertension  RF is recent hip surgery  S/P cath directed lysis  Improvement in PA pressures  PLAN: Cont heparin Advance diet as tolerated Advance activity as permitted by IR post procedure Plan rivoroxaban 9/08 Likely DC home in 2-3 days Will need Pulm F/U post discharge Wishes to have other hip operated on when feasible - discussed strategies that we might undertake for this   Merton Border, MD ; [facility] service Mobile [phone].  After 5:30 PM or weekends, call [phone]

## 2014-08-07 DIAGNOSIS — I2699 Other pulmonary embolism without acute cor pulmonale: Secondary | ICD-10-CM

## 2014-08-07 DIAGNOSIS — I059 Rheumatic mitral valve disease, unspecified: Secondary | ICD-10-CM

## 2014-08-07 LAB — ANTIPHOSPHOLIPID SYNDROME EVAL, BLD
ANTICARDIOLIPIN IGG: 4 GPL U/mL — AB (ref <23)
Anticardiolipin IgA: 11 APL U/mL — ABNORMAL LOW (ref <22)
Anticardiolipin IgM: 48 MPL U/mL — ABNORMAL HIGH (ref <11)
DRVVT: 38 s (ref <42.9)
LUPUS ANTICOAGULANT: DETECTED — AB
PHOSPHATYDALSERINE, IGA: 11 U/mL (ref <20)
PTT Lupus Anticoagulant: 148.3 secs — ABNORMAL HIGH (ref 28.0–43.0)
PTTLA 41 MIX: 118 s — AB (ref 28.0–43.0)
PTTLA CONFIRMATION: 10.4 s — AB (ref <8.0)
Phosphatydalserine, IgG: 5 U/mL (ref <16)
Phosphatydalserine, IgM: 42 U/mL — ABNORMAL HIGH (ref <22)

## 2014-08-07 LAB — CBC
HEMATOCRIT: 39.2 % (ref 39.0–52.0)
HEMOGLOBIN: 13.3 g/dL (ref 13.0–17.0)
MCH: 32 pg (ref 26.0–34.0)
MCHC: 33.9 g/dL (ref 30.0–36.0)
MCV: 94.5 fL (ref 78.0–100.0)
Platelets: 117 10*3/uL — ABNORMAL LOW (ref 150–400)
RBC: 4.15 MIL/uL — AB (ref 4.22–5.81)
RDW: 12.6 % (ref 11.5–15.5)
WBC: 7.2 10*3/uL (ref 4.0–10.5)

## 2014-08-07 LAB — HEPARIN LEVEL (UNFRACTIONATED): Heparin Unfractionated: 0.39 IU/mL (ref 0.30–0.70)

## 2014-08-07 MED ORDER — RIVAROXABAN 15 MG PO TABS
15.0000 mg | ORAL_TABLET | Freq: Two times a day (BID) | ORAL | Status: DC
  Administered 2014-08-07 – 2014-08-08 (×2): 15 mg via ORAL
  Filled 2014-08-07 (×4): qty 1

## 2014-08-07 MED ORDER — LIDOCAINE HCL 2 % EX GEL: 1.0000 "application " | Freq: Once | CUTANEOUS | Status: DC

## 2014-08-07 MED ORDER — RIVAROXABAN (XARELTO) EDUCATION KIT FOR DVT/PE PATIENTS
PACK | Freq: Once | Status: DC
  Filled 2014-08-07 (×2): qty 1

## 2014-08-07 NOTE — Progress Notes (Signed)
No new complaints No distress  Filed Vitals:   08/07/14 1000 08/07/14 1100 08/07/14 1155 08/07/14 2207  BP:   120/63 128/63  Pulse: 75 65 65 70  Temp:   97.8 F (36.6 C) 98 F (36.7 C)  TempSrc:   Oral Oral  Resp: 23 21 16 18   Height:   5\' 10"  (1.778 m)   Weight:   91 kg (200 lb 9.9 oz)   SpO2: 98% 96% 100% 97%   NAD HEENT WNL No JVD noted Chest clear RRR s M NABS No edema Neuro intact  I have reviewed all of today's lab results. Relevant abnormalities are discussed in the A/P section  No new CXR   IMP: Recent L THR Acute submassive PE c/b pulm hypertension  RF is recent hip surgery  S/P cath directed lysis  Improvement in PA pressures  PLAN: Start rivaroxaban 9/08 DC heparin Advance activity Likely DC home 9/09 after repeat CTA chest Hopes to undergo R THR sometime after the first of the year Will need Pulm F/U post discharge to help manage timing of next surgery and peri-op anticoagulation strategy    Merton Border, MD ; [facility] service Mobile [phone].  After 5:30 PM or weekends, call [phone]

## 2014-08-07 NOTE — Progress Notes (Signed)
  Echocardiogram 2D Echocardiogram has been performed.  Nicholas Hale 08/07/2014, 10:22 AM

## 2014-08-07 NOTE — Clinical Documentation Improvement (Signed)
  Patient admitted with Pulmonary Embolism and DVT.  "Right Heart Strain" documented.   Pulmonary Artery Lysis with TPA performed by Interventional Radiology resulting in "Technically successful bilateral ultrasound assisted pulmonary arterial thrombolysis with significant reduction in the bilateral mean arterial pressures - the right mean pulmonary arterial pressure improved by 13 mmHg while the left improved by 14 mmHg."  Please clarify in the progress notes and discharge summary if a condition below provides greater specificity regarding the patient's "Right Heart Strain"   - Acute Cor Pulmonale   - Other Condition   - Not Clinically Applicable   Thank You, Erling Conte ,RN Clinical Documentation Specialist:  [phone] [facility] [employer] [facility]

## 2014-08-07 NOTE — Progress Notes (Signed)
Subjective: B PE lysis  Initiated 9/6 Completed 9/7 For CT angio PE study to evaluate post procedure 9/9 am Pt feeling better Breathing easier  Objective: Vital signs in last 24 hours: Temp:  [97.8 F (36.6 C)-99 F (37.2 C)] 97.8 F (36.6 C) (09/08 1155) Pulse Rate:  [64-84] 65 (09/08 1155) Resp:  [16-24] 16 (09/08 1155) BP: (106-137)/(42-79) 120/63 mmHg (09/08 1155) SpO2:  [95 %-100 %] 100 % (09/08 1155) Weight:  [91 kg (200 lb 9.9 oz)] 91 kg (200 lb 9.9 oz) (09/08 1155) Last BM Date:  (PTA)  Intake/Output from previous day: 09/07 0701 - 09/08 0700 In: 956 [I.V.:956] Out: 700 [Urine:700] Intake/Output this shift: Total I/O In: 64 [I.V.:64] Out: -   PE:  Afeb; vss Lungs: CTA Up in bed Has ambulated in room Labs stable  Lab Results:   Recent Labs  08/06/14 0840 08/07/14 0250  WBC 7.9 7.2  HGB 14.0 13.3  HCT 39.9 39.2  PLT 113* 117*   BMET  Recent Labs  08/05/14 1323  NA 140  K 3.9  CL 101  CO2 24  GLUCOSE 143*  BUN 20  CREATININE 0.97  CALCIUM 9.1   PT/INR No results found for this basename: LABPROT, INR,  in the last 72 hours ABG No results found for this basename: PHART, PCO2, PO2, HCO3,  in the last 72 hours  Studies/Results: Ct Angio Chest W/cm &/or Wo Cm  08/05/2014   CLINICAL DATA:  Shortness of breath with exertion.  Left leg pain.  EXAM: CT ANGIOGRAPHY CHEST WITH CONTRAST  TECHNIQUE: Multidetector CT imaging of the chest was performed using the standard protocol during bolus administration of intravenous contrast. Multiplanar CT image reconstructions and MIPs were obtained to evaluate the vascular anatomy.  CONTRAST:  75mL OMNIPAQUE IOHEXOL 350 MG/ML SOLN  COMPARISON:  Chest x-ray earlier today.  FINDINGS: There is evidence of submassive bilateral pulmonary embolism. On the left, tubular thrombus is identified in the main pulmonary artery that branches into upper lobe and lower lobe pulmonary arteries. Thrombus in the upper lobe pulmonary  artery is occlusive. Thrombus in the lower lobe pulmonary artery is nonocclusive with near occlusive component in the posterior basilar left lower lobe. Some occlusive thrombus is seen in a superior lingular branch. Inferior lingular clot is nonocclusive. On the right side, thrombus is identified in the distal right main pulmonary artery. Nonocclusive thrombus extends into the middle lobe and lower lobe with lower lobe thrombus nearly occlusive. Thrombus in the right upper lobe is nonocclusive.  Associated evidence of right heart strain by CTA with estimated RV/LV ratio 1.73. No associated overt pulmonary edema or pleural fluid. Patchy areas of parenchymal opacity in the peripheral left lower lobe, inferior lingula and right upper lobe likely relate to changes secondary to pulmonary embolism and may even represent some areas of early pulmonary infarct, especially in the left lower lobe.  No pericardial fluid is identified. No masses or enlarged lymph nodes. The upper abdomen shows steatosis of the liver. The bony thorax is unremarkable.  Review of the MIP images confirms the above findings.  IMPRESSION: Submassive bilateral acute pulmonary embolism with significant clot burden bilaterally. There is evidence of right heart strain with estimated RV/LV ratio of 1.73.  Positive for acute PE with CT evidence of right heart strain (RV/LV Ratio = 1.73) consistent with at least submassive (intermediate risk) PE. The presence of right heart strain has been associated with an increased risk of morbidity and mortality. Consultation with Pulmonary and Critical  Care Medicine is recommended.  Findings were discussed with Dr. Venora Maples by telephone.   Electronically Signed   By: Aletta Edouard M.D.   On: 08/05/2014 16:37   Ir Angiogram Pulmonary Bilateral Selective  08/06/2014   CLINICAL DATA:  Sub massive bilateral acute pulmonary thromboembolism  EXAM: IR INFUSION THROMBOL ARTERIAL INITIAL (MS); BILATERAL PULMONARY  ARTERIOGRAPHY; IR ULTRASOUND GUIDANCE VASC ACCESS RIGHT; ADDITIONAL ARTERIOGRAPHY  FLUOROSCOPY TIME:  9 min and 30 seconds.  MEDICATIONS AND MEDICAL HISTORY: Versed 1.5 mg, Fentanyl 75 mcg.  Additional Medications: None.  ANESTHESIA/SEDATION: Moderate sedation time: 28 minutes  CONTRAST:  50 cc Omnipaque 3 on  PROCEDURE: The procedure, risks, benefits, and alternatives were explained to the patient. Questions regarding the procedure were encouraged and answered. The patient understands and consents to the procedure.  The right groin was prepped with Betadine in a sterile fashion, and a sterile drape was applied covering the operative field. A sterile gown and sterile gloves were used for the procedure.  Under sonographic guidance, a micropuncture needle was inserted into the right common femoral vein and removed over a 018 wire which was up sized to a Bentson. A 6 French sheath was inserted. An additional 6 French sheath was inserted into the right common femoral vein in a similar manner.  A 5 French Davie catheter was advanced over the Bentson wire into the right atrium and into the left pulmonary artery. Left pulmonary artery arteriogram and pressures were obtained. Left pulmonary artery pressure was recorded at 58/24 with a mean of 36 mm Hg.  A 5 [place] Davie catheter was then advanced over the Bentson wire into the right pulmonary artery. Pulmonary arteriogram and pressures were obtained. Right pulmonary artery pressure was 65/27 with a mean of 37 mm Hg.  The left the AV catheter was exchanged over a Rosen wire for a 12 cm 5 [place] EKOS catheter. The right was exchanged over a Rosen wire for a and 18 cm EKOS catheter. Ultrasound assisted bilateral pulmonary artery lysis was instituted at 1 milligram/hour.  FINDINGS: Bilateral pulmonary arteriography confirms bilateral pulmonary thromboembolism.  COMPLICATIONS: None  IMPRESSION: Successful ultrasound assisted bilateral pulmonary artery lysis.   Electronically  Signed   By: Maryclare Bean M.D.   On: 08/06/2014 15:43   Ir Angiogram Selective Each Additional Vessel  08/06/2014   CLINICAL DATA:  Sub massive bilateral acute pulmonary thromboembolism  EXAM: IR INFUSION THROMBOL ARTERIAL INITIAL (MS); BILATERAL PULMONARY ARTERIOGRAPHY; IR ULTRASOUND GUIDANCE VASC ACCESS RIGHT; ADDITIONAL ARTERIOGRAPHY  FLUOROSCOPY TIME:  9 min and 30 seconds.  MEDICATIONS AND MEDICAL HISTORY: Versed 1.5 mg, Fentanyl 75 mcg.  Additional Medications: None.  ANESTHESIA/SEDATION: Moderate sedation time: 28 minutes  CONTRAST:  50 cc Omnipaque 3 on  PROCEDURE: The procedure, risks, benefits, and alternatives were explained to the patient. Questions regarding the procedure were encouraged and answered. The patient understands and consents to the procedure.  The right groin was prepped with Betadine in a sterile fashion, and a sterile drape was applied covering the operative field. A sterile gown and sterile gloves were used for the procedure.  Under sonographic guidance, a micropuncture needle was inserted into the right common femoral vein and removed over a 018 wire which was up sized to a Bentson. A 6 French sheath was inserted. An additional 6 French sheath was inserted into the right common femoral vein in a similar manner.  A 5 French Davie catheter was advanced over the Bentson wire into the right atrium and into the left pulmonary  artery. Left pulmonary artery arteriogram and pressures were obtained. Left pulmonary artery pressure was recorded at 58/24 with a mean of 36 mm Hg.  A 5 [place] Davie catheter was then advanced over the Bentson wire into the right pulmonary artery. Pulmonary arteriogram and pressures were obtained. Right pulmonary artery pressure was 65/27 with a mean of 37 mm Hg.  The left the AV catheter was exchanged over a Rosen wire for a 12 cm 5 [place] EKOS catheter. The right was exchanged over a Rosen wire for a and 18 cm EKOS catheter. Ultrasound assisted bilateral pulmonary  artery lysis was instituted at 1 milligram/hour.  FINDINGS: Bilateral pulmonary arteriography confirms bilateral pulmonary thromboembolism.  COMPLICATIONS: None  IMPRESSION: Successful ultrasound assisted bilateral pulmonary artery lysis.   Electronically Signed   By: Maryclare Bean M.D.   On: 08/06/2014 15:43   Ir US Guide Vasc Access Right  08/06/2014   CLINICAL DATA:  Sub massive bilateral acute pulmonary thromboembolism  EXAM: IR INFUSION THROMBOL ARTERIAL INITIAL (MS); BILATERAL PULMONARY ARTERIOGRAPHY; IR ULTRASOUND GUIDANCE VASC ACCESS RIGHT; ADDITIONAL ARTERIOGRAPHY  FLUOROSCOPY TIME:  9 min and 30 seconds.  MEDICATIONS AND MEDICAL HISTORY: Versed 1.5 mg, Fentanyl 75 mcg.  Additional Medications: None.  ANESTHESIA/SEDATION: Moderate sedation time: 28 minutes  CONTRAST:  50 cc Omnipaque 3 on  PROCEDURE: The procedure, risks, benefits, and alternatives were explained to the patient. Questions regarding the procedure were encouraged and answered. The patient understands and consents to the procedure.  The right groin was prepped with Betadine in a sterile fashion, and a sterile drape was applied covering the operative field. A sterile gown and sterile gloves were used for the procedure.  Under sonographic guidance, a micropuncture needle was inserted into the right common femoral vein and removed over a 018 wire which was up sized to a Bentson. A 6 French sheath was inserted. An additional 6 French sheath was inserted into the right common femoral vein in a similar manner.  A 5 French Davie catheter was advanced over the Bentson wire into the right atrium and into the left pulmonary artery. Left pulmonary artery arteriogram and pressures were obtained. Left pulmonary artery pressure was recorded at 58/24 with a mean of 36 mm Hg.  A 5 [place] Davie catheter was then advanced over the Bentson wire into the right pulmonary artery. Pulmonary arteriogram and pressures were obtained. Right pulmonary artery pressure was  65/27 with a mean of 37 mm Hg.  The left the AV catheter was exchanged over a Rosen wire for a 12 cm 5 [place] EKOS catheter. The right was exchanged over a Rosen wire for a and 18 cm EKOS catheter. Ultrasound assisted bilateral pulmonary artery lysis was instituted at 1 milligram/hour.  FINDINGS: Bilateral pulmonary arteriography confirms bilateral pulmonary thromboembolism.  COMPLICATIONS: None  IMPRESSION: Successful ultrasound assisted bilateral pulmonary artery lysis.   Electronically Signed   By: Maryclare Bean M.D.   On: 08/06/2014 15:43   Dg Chest Portable 1 View  08/05/2014   CLINICAL DATA:  Shortness of breath 1 week.  EXAM: PORTABLE CHEST - 1 VIEW  COMPARISON:  08/05/2014 and 05/17/2014  FINDINGS: Lordotic technique is demonstrated. There is mild focal opacification over the lateral left base as cannot exclude atelectasis versus infection. Cardiomediastinal silhouette and remainder of the exam is unchanged.  IMPRESSION: Opacification over the lateral left base which may be due to atelectasis versus infection.   Electronically Signed   By: Marin Olp M.D.   On: 08/05/2014 15:18  Ir Infusion Thrombol Arterial Initial (ms)  08/06/2014   CLINICAL DATA:  Sub massive bilateral acute pulmonary thromboembolism  EXAM: IR INFUSION THROMBOL ARTERIAL INITIAL (MS); BILATERAL PULMONARY ARTERIOGRAPHY; IR ULTRASOUND GUIDANCE VASC ACCESS RIGHT; ADDITIONAL ARTERIOGRAPHY  FLUOROSCOPY TIME:  9 min and 30 seconds.  MEDICATIONS AND MEDICAL HISTORY: Versed 1.5 mg, Fentanyl 75 mcg.  Additional Medications: None.  ANESTHESIA/SEDATION: Moderate sedation time: 28 minutes  CONTRAST:  50 cc Omnipaque 3 on  PROCEDURE: The procedure, risks, benefits, and alternatives were explained to the patient. Questions regarding the procedure were encouraged and answered. The patient understands and consents to the procedure.  The right groin was prepped with Betadine in a sterile fashion, and a sterile drape was applied covering the operative  field. A sterile gown and sterile gloves were used for the procedure.  Under sonographic guidance, a micropuncture needle was inserted into the right common femoral vein and removed over a 018 wire which was up sized to a Bentson. A 6 French sheath was inserted. An additional 6 French sheath was inserted into the right common femoral vein in a similar manner.  A 5 French Davie catheter was advanced over the Bentson wire into the right atrium and into the left pulmonary artery. Left pulmonary artery arteriogram and pressures were obtained. Left pulmonary artery pressure was recorded at 58/24 with a mean of 36 mm Hg.  A 5 [place] Davie catheter was then advanced over the Bentson wire into the right pulmonary artery. Pulmonary arteriogram and pressures were obtained. Right pulmonary artery pressure was 65/27 with a mean of 37 mm Hg.  The left the AV catheter was exchanged over a Rosen wire for a 12 cm 5 [place] EKOS catheter. The right was exchanged over a Rosen wire for a and 18 cm EKOS catheter. Ultrasound assisted bilateral pulmonary artery lysis was instituted at 1 milligram/hour.  FINDINGS: Bilateral pulmonary arteriography confirms bilateral pulmonary thromboembolism.  COMPLICATIONS: None  IMPRESSION: Successful ultrasound assisted bilateral pulmonary artery lysis.   Electronically Signed   By: Maryclare Bean M.D.   On: 08/06/2014 15:43   Ir Jacolyn Reedy F/u Eval Art/ven Final Day (ms)  08/06/2014   INDICATION: Recent history of left total hip replacement, admitted with symptomatic sub massive bilateral pulmonary embolism, post initiation of bilateral ultrasound assisted pulmonary arterial thrombolysis. Patient presents today for pressure measurements and removal of bilateral infusion catheters. Note, the patient is subjectively much improved with marked improvement in is preprocedural shortness of breath. No chest pain or hemoptysis.  EXAM: IR THROMB F/U EVAL ART/VEN FINAL DAY  COMPARISON:  Initiation of bilateral  ultrasound assisted pulmonary arterial thrombolysis - 08/05/2014; chest CT - 08/05/2014  MEDICATIONS: None.  CONTRAST:  None  FLUOROSCOPY TIME:  6 seconds.  COMPLICATIONS: None immediate  TECHNIQUE: Informed written consent was obtained from the patient after a discussion of the risks, benefits and alternatives to treatment. Questions regarding the procedure were encouraged and answered. A timeout was performed prior to the initiation of the procedure.  A preprocedural spot fluoroscopic image was obtained of the chest.  Right and left pulmonary arterial pressure measurements were obtained.  Under intermittent fluoroscopic guidance, the bilateral pulmonary arterial infusion catheters removed.  The 2 right groin venous sheaths were removed and hemostasis was achieved with manual compression. A dressing was placed. The patient tolerated the procedure well without immediate postprocedural complication.  FINDINGS: Preprocedural spot fluoroscopic image demonstrates unchanged positioning of the bilateral multi side-hole infusion catheters.  Pressure Measurements:  Right pulmonary artery: 41/14 (mean -  24); preprocedural right mean pulmonary arterial pressure - 37  Left pulmonary artery: 29/4 (mean - 19); preprocedural left mean pulmonary arterial pressure - 33  IMPRESSION: Technically successful bilateral ultrasound assisted pulmonary arterial thrombolysis with significant reduction in the bilateral mean arterial pressures - the right mean pulmonary arterial pressure improved by 13 mmHg while the left improved by 14 mmHg.  PLAN: The patient will continue on heparin drip until converted to appropriate long-term anticoagulant as decided by PCCM. Will obtain a post treatment chest CTA approximately 48 hours after the initiation of the thrombolysis (at approximately 18:00 tomorrow). Consideration for IVC filter placement as per PCCM.  Above findings discussed with Dr. Alva Garnet at the time of procedure completion.    Electronically Signed   By: Sandi Mariscal M.D.   On: 08/06/2014 15:04    Anti-infectives: Anti-infectives   None      Assessment/Plan: s/p * No surgery found *  B PE lysis 9/6-9/7 CT angio PE study post procedure scheduled for 9/9 am Doing well   LOS: 2 days    Renella Steig A 08/07/2014

## 2014-08-07 NOTE — Progress Notes (Signed)
ANTICOAGULATION CONSULT NOTE - Initial Consult  Pharmacy Consult for Xarelto Indication: pulmonary embolus  No Known Allergies  Patient Measurements: Height: 5\' 11"  (180.3 cm) Weight: 201 lb 15.1 oz (91.6 kg) IBW/kg (Calculated) : 75.3 Heparin Dosing Weight: 91.6 kg  Vital Signs: Temp: 98 F (36.7 C) (09/08 0814) Temp src: Oral (09/08 0814) BP: 125/63 mmHg (09/08 0800) Pulse Rate: 65 (09/08 1100)  Labs:  Recent Labs  08/05/14 1323 08/05/14 1434  08/06/14 0049 08/06/14 0340 08/06/14 0840 08/07/14 0250  HGB 15.5  --   < >  --  13.6 14.0 13.3  HCT 45.6  --   < >  --  40.9 39.9 39.2  PLT 133*  --   < >  --  120* 113* 117*  HEPARINUNFRC  --   --   --  0.44  --  0.42 0.39  CREATININE 0.97  --   --   --   --   --   --   TROPONINI  --  0.47*  --   --   --   --   --   < > = values in this interval not displayed.  Estimated Creatinine Clearance: 84.3 ml/min (by C-G formula based on Cr of 0.97).   Medical History: Past Medical History  Diagnosis Date  . Hypertension   . Hyperlipidemia   . Abdominal aortic aneurysm   . Arthritis     "hips; lower spine" (05/24/2014)   Assessment: 68 YOM on IV heparin for PE (s/p catheter-directed lysis) now to transition to Xarelto. Heparin level was therapeutic this AM. H/H is stable. Platelets 117.  SCr is within normal limits. Current CrCl 84 mL/min. Note patient will need second hip surgery for R-hip in near future (first part of 2016).   Goal of Therapy:  Monitor platelets by anticoagulation protocol: Yes   Plan:  Xarelto15 mg po BID for 21 days, then change to 20 mg po daily.  Stop heparin at the same time as first dose of Xarelto.  Discontinue heparin levels and CBC.  Monitor renal function and adjust therapy as needed.   Sloan Leiter, PharmD, BCPS Clinical Pharmacist [phone] 08/07/2014,11:36 AM

## 2014-08-08 ENCOUNTER — Encounter (HOSPITAL_COMMUNITY): Payer: Self-pay | Admitting: *Deleted

## 2014-08-08 ENCOUNTER — Inpatient Hospital Stay (HOSPITAL_COMMUNITY): Payer: Medicare Other

## 2014-08-08 LAB — CBC
HCT: 38.6 % — ABNORMAL LOW (ref 39.0–52.0)
Hemoglobin: 13.1 g/dL (ref 13.0–17.0)
MCH: 31.7 pg (ref 26.0–34.0)
MCHC: 33.9 g/dL (ref 30.0–36.0)
MCV: 93.5 fL (ref 78.0–100.0)
Platelets: 164 10*3/uL (ref 150–400)
RBC: 4.13 MIL/uL — ABNORMAL LOW (ref 4.22–5.81)
RDW: 12.7 % (ref 11.5–15.5)
WBC: 7.5 10*3/uL (ref 4.0–10.5)

## 2014-08-08 MED ORDER — APIXABAN 5 MG PO TABS: ORAL_TABLET | ORAL | Status: DC

## 2014-08-08 MED ORDER — IOHEXOL 350 MG/ML SOLN
100.0000 mL | Freq: Once | INTRAVENOUS | Status: AC | PRN
  Administered 2014-08-08: 100 mL via INTRAVENOUS

## 2014-08-08 NOTE — Progress Notes (Signed)
Discharge home . Home discharge instructions given. No questions verbalized.

## 2014-08-08 NOTE — Discharge Summary (Signed)
Physician Discharge Summary  Patient ID: Nicholas Hale MRN: [id number] DOB/AGE: [id number] 68 y.o.  Admit date: 08/05/2014 Discharge date: 08/08/2014    Discharge Diagnoses:  Active Problems:   PE (pulmonary embolism)   Pulmonary embolism                      Discharge Plan by Discharge Diagnosis  Acute submassive PE c/b pulm hypertension - s/p catheter directed lysis  D/c plan --  Continue anticoagulation with eliquis at d/c  Needs 6 months total  outpt pulm f/u with CXR   Recent L total hip  Arthritis  D/c plan --  Pt needs to undergo R total hip replacement sometime in near future - outpt pulm f/u to help manage timing of surgery and peri-op anticoagulation    Brief Summary: Nicholas Hale is a 68 y.o. y/o male with a PMH of HTN, family hx of "clotting disorder" and recent L total knee replacement (June 2015) presented 9/6 with 5 days progressive SOB and L thigh pain.  Venous doppler revealed BLE DVTS and CTA chest was positive for submassive Bilat PE with significant clot burden.  Troponin was mildly elevated at 0.47 and PCCM was asked to admit.  Pt was placed on heparin drip.  Consult was made to Interventional Radiology and they felt pt to be good candidate for catheter directed lysis.  EKOS catheter was placed 9/6 and removed 9/7, PA pressures much improved.  Pt was monitored closely in ICU on heparin gtt, transitioned to xarelto 9/8 and tolerated well.  Will d/c home on Eliquis r/t financial reasons. He is now much improved, ambulating in room without SOB, on RA with good O2 sats and ready for d/c home pending close outpt f/u.  Discussed s/s which should prompt a call to MD v s/s which he should return to ED for.    Consults: IR ([facility])  Lines/tubes: R groin sheath 9/6>>9/7  Microbiology/Sepsis markers: none  Significant Diagnostic Studies:  CTA chest 9/7>>>Submassive bilateral acute pulmonary embolism with significant clot burden bilaterally. There is evidence of right  heart strain with estimated RV/LV ratio of 1.73.  BLE venous dopplers 9/6>>> mult BLE acute DVT 2D echo 9/8>>> EF 60-65%, normal LV, slightly dilated RV, mod hypokinesis RV, mild MR, normal PA pressures  CTA chest 9/9>>>The clot burden within the pulmonary arteries has decreased. In addition, the RV/LV ratio has decreased suggesting decreased right heart strain. Persistent peripheral opacities in both lungs. Based on the known pulmonary emboli, these are most compatible with evolving infarcts. However, the patient does have a prominent right paratracheal lymph node and right hilar lymph node which are indeterminate. The paratracheal lymph node is near the right thyroid lobe and could be related to the thyroid gland.                                            Filed Vitals:   08/07/14 1100 08/07/14 1155 08/07/14 2207 08/08/14 0522  BP:  120/63 128/63 125/56  Pulse: 65 65 70 74  Temp:  97.8 F (36.6 C) 98 F (36.7 C) 98.4 F (36.9 C)  TempSrc:  Oral Oral Oral  Resp: $Remo'21 16 18 16  'DfRfG$ Height:  $Remove'5\' 10"'rEkdZTx$  (1.778 m)    Weight:  200 lb 9.9 oz (91 kg)    SpO2: 96% 100% 97% 97%     Discharge  Labs  BMET  Recent Labs Lab 08/05/14 1323  NA 140  K 3.9  CL 101  CO2 24  GLUCOSE 143*  BUN 20  CREATININE 0.97  CALCIUM 9.1     CBC   Recent Labs Lab 08/06/14 0840 08/07/14 0250 08/08/14 0500  HGB 14.0 13.3 13.1  HCT 39.9 39.2 38.6*  WBC 7.9 7.2 7.5  PLT 113* 117* 164   Anti-Coagulation No results found for this basename: INR,  in the last 168 hours   Discharge Instructions   Call MD for:  difficulty breathing, headache or visual disturbances    Complete by:  As directed      Call MD for:  extreme fatigue    Complete by:  As directed      Call MD for:  persistant dizziness or light-headedness    Complete by:  As directed      Call MD for:  redness, tenderness, or signs of infection (pain, swelling, redness, odor or green/yellow discharge around incision site)    Complete by:  As  directed      Call MD for:    Complete by:  As directed   Chest pain, shortness of breath     Increase activity slowly    Complete by:  As directed                Follow-up Information   Follow up with Milagros Evener, MD. Schedule an appointment as soon as possible for a visit in 2 weeks.   Specialty:  Family Medicine   Contact information:   [place] [address] [phone]       Follow up with Collene Gobble., MD On 08/29/2014. (11:30am )    Specialty:  Pulmonary Disease   Contact information:   [address] [place] [address] [phone]          Medication List    STOP taking these medications       lisinopril-hydrochlorothiazide 20-12.5 MG per tablet  Commonly known as:  PRINZIDE,ZESTORETIC     meloxicam 15 MG tablet  Commonly known as:  MOBIC      TAKE these medications       apixaban 5 MG Tabs tablet  Commonly known as:  ELIQUIS  2 tabs by mouth twice daily x 6 days then 1 tab by mouth twice daily     cyclobenzaprine 5 MG tablet  Commonly known as:  FLEXERIL  Take 5 mg by mouth daily as needed for muscle spasms.     oxyCODONE-acetaminophen 5-325 MG per tablet  Commonly known as:  ROXICET  Take 1 tablet by mouth every 4 (four) hours as needed.     pravastatin 40 MG tablet  Commonly known as:  PRAVACHOL  Take 40 mg by mouth daily.     traMADol 50 MG tablet  Commonly known as:  ULTRAM  Take 50 mg by mouth every 6 (six) hours as needed.          Disposition: 06-Home-Health Care Svc  Discharged Condition: Nicholas Hale has met maximum benefit of inpatient care and is medically stable and cleared for discharge.  Patient is pending follow up as above.      Time spent on disposition:  Greater than 35 minutes.   SignedDarlina Sicilian, NP 08/08/2014  10:43 AM Pager: [phone] or [phone]  *Care during the described time interval was provided by me and/or other providers on the critical care  team. I have reviewed  this patient's available data, including medical history, events of note, physical examination and test results as part of my evaluation.    Merton Border, MD ; [facility] [phone].  After 5:30 PM or weekends, call [phone]

## 2014-08-08 NOTE — Discharge Instructions (Signed)
Information on my medicine - XARELTO (rivaroxaban)  This medication education was reviewed with me or my healthcare representative as part of my discharge preparation.  The pharmacist that spoke with me during my hospital stay was:  Lavenia Atlas, [place]? Xarelto was prescribed to treat blood clots that may have been found in the veins of your legs (deep vein thrombosis) or in your lungs (pulmonary embolism) and to reduce the risk of them occurring again.  What do you need to know about Xarelto? The starting dose is one 15 mg tablet taken TWICE daily with food for the FIRST 21 DAYS then on (enter date)  08/29/14  the dose is changed to one 20 mg tablet taken ONCE A DAY with your evening meal.  DO NOT stop taking Xarelto without talking to the health care provider who prescribed the medication.  Refill your prescription for 20 mg tablets before you run out.  After discharge, you should have regular check-up appointments with your healthcare provider that is prescribing your Xarelto.  In the future your dose may need to be changed if your kidney function changes by a significant amount.  What do you do if you miss a dose? If you are taking Xarelto TWICE DAILY and you miss a dose, take it as soon as you remember. You may take two 15 mg tablets (total 30 mg) at the same time then resume your regularly scheduled 15 mg twice daily the next day.  If you are taking Xarelto ONCE DAILY and you miss a dose, take it as soon as you remember on the same day then continue your regularly scheduled once daily regimen the next day. Do not take two doses of Xarelto at the same time.   Important Safety Information Xarelto is a blood thinner medicine that can cause bleeding. You should call your healthcare provider right away if you experience any of the following:   Bleeding from an injury or your nose that does not stop.   Unusual colored urine (red or dark brown)  or unusual colored stools (red or black).   Unusual bruising for unknown reasons.   A serious fall or if you hit your head (even if there is no bleeding).  Some medicines may interact with Xarelto and might increase your risk of bleeding while on Xarelto. To help avoid this, consult your healthcare provider or pharmacist prior to using any new prescription or non-prescription medications, including herbals, vitamins, non-steroidal anti-inflammatory drugs (NSAIDs) and supplements.  This website has more information on Xarelto: [email or web address]

## 2014-08-10 LAB — FACTOR 5 LEIDEN

## 2014-08-15 DIAGNOSIS — R894 Abnormal immunological findings in specimens from other organs, systems and tissues: Secondary | ICD-10-CM | POA: Diagnosis not present

## 2014-08-15 DIAGNOSIS — I2699 Other pulmonary embolism without acute cor pulmonale: Secondary | ICD-10-CM | POA: Diagnosis not present

## 2014-08-16 ENCOUNTER — Telehealth: Payer: Self-pay | Admitting: Oncology

## 2014-08-16 NOTE — Telephone Encounter (Signed)
C/D 08/16/14 for appt. 08/28/14

## 2014-08-16 NOTE — Telephone Encounter (Signed)
S/W PT IN REF TO NP APPT ON 08/28/14@1 :30 REFERRING DR Radene Ou DX-RECENT B DVT AND LARGE PE B

## 2014-08-27 ENCOUNTER — Encounter: Payer: Self-pay | Admitting: *Deleted

## 2014-08-28 ENCOUNTER — Other Ambulatory Visit: Payer: Medicare Other

## 2014-08-28 ENCOUNTER — Telehealth: Payer: Self-pay | Admitting: Oncology

## 2014-08-28 ENCOUNTER — Ambulatory Visit: Payer: Medicare Other

## 2014-08-28 ENCOUNTER — Other Ambulatory Visit (HOSPITAL_BASED_OUTPATIENT_CLINIC_OR_DEPARTMENT_OTHER): Payer: Medicare Other

## 2014-08-28 ENCOUNTER — Ambulatory Visit (HOSPITAL_BASED_OUTPATIENT_CLINIC_OR_DEPARTMENT_OTHER): Payer: Medicare Other | Admitting: Oncology

## 2014-08-28 ENCOUNTER — Encounter: Payer: Self-pay | Admitting: Oncology

## 2014-08-28 VITALS — BP 156/67 | HR 62 | Temp 98.5°F | Resp 18 | Ht 70.0 in | Wt 208.6 lb

## 2014-08-28 DIAGNOSIS — I82409 Acute embolism and thrombosis of unspecified deep veins of unspecified lower extremity: Secondary | ICD-10-CM

## 2014-08-28 DIAGNOSIS — I82419 Acute embolism and thrombosis of unspecified femoral vein: Secondary | ICD-10-CM

## 2014-08-28 DIAGNOSIS — I2699 Other pulmonary embolism without acute cor pulmonale: Secondary | ICD-10-CM | POA: Diagnosis not present

## 2014-08-28 DIAGNOSIS — I824Y9 Acute embolism and thrombosis of unspecified deep veins of unspecified proximal lower extremity: Secondary | ICD-10-CM

## 2014-08-28 NOTE — Consult Note (Signed)
Reason for Referral: PE  HPI: Nicholas Hale is a 2 gentleman currently of [place].  He has a past medical history significant for herw HTN and recent L total hip replacement in June 2015. He presented on 9/6 with 5 days progressive SOB and L thigh pain. Venous doppler revealed DVTS and CTA chest was positive for submassive bilateral PE with significant clot burden. Troponin was mildly elevated at 0.47. He was hospitalized and was in the intensive care unit initially. He was placed on heparin drip. Consult was made to Interventional Radiology and they felt to be good candidate for catheter directed lysis. EKOS catheter was placed 9/6 and removed 9/7, PA pressures much improved. Pt was monitored closely in ICU on heparin gtt, transitioned to xarelto 9/8 and tolerated well. Will d/c home on Eliquis  Due to financial reasons. Since his discharge he has been well without any complications. He was evaluated by his primary care physician and had a hypercoagulable workup was unrevealing except for a positive lupus anticoagulant. He was referred to me for an evaluation regarding these findings. He denied any previous blood clots in the past. He had multiple surgeries in the past including a neck operation and never really had any postoperative complications. He has reported that his mother had arterial clots in her legs and subsequently led to her death. It is unclear whether there is a clear-cut hypercoagulable condition identified.  Clinically, he is currently asymptomatic. He did not report any headaches or blurry vision or syncope. He does not report any chest pain, shortness of breath, leg edema. He does not report any cough, wheezing or difficulty breathing. He did not report any nausea, vomiting, hematochezia, melena. His laboratory frequency, urgency, hesitancy. He is not report any lymphadenopathy or petechiae. He does not report any skeletal complaints. Rest of his review of systems unremarkable.   Past  Medical History  Diagnosis Date  . Hypertension   . Hyperlipidemia   . Abdominal aortic aneurysm   . Arthritis     "hips; lower spine" (05/24/2014)  :  Past Surgical History  Procedure Laterality Date  . Hemiarthroplasty shoulder fracture Left 12/2008  . Appendectomy  1978  . Anterior fusion cervical spine  ~ 2005  . Colonoscopy    . Total hip arthroplasty Left 05/23/2014    Procedure: TOTAL HIP ARTHROPLASTY;  Surgeon: Kerin Salen, MD;  Location: [facility];  Service: Orthopedics;  Laterality: Left;  . Joint replacement    . Inguinal hernia repair Bilateral 1992  :   Current Outpatient Prescriptions  Medication Sig Dispense Refill  . apixaban (ELIQUIS) 5 MG TABS tablet 2 tabs by mouth twice daily x 6 days then 1 tab by mouth twice daily  70 tablet  0  . cyclobenzaprine (FLEXERIL) 5 MG tablet Take 5 mg by mouth daily as needed for muscle spasms.      Marland Kitchen oxyCODONE-acetaminophen (ROXICET) 5-325 MG per tablet Take 1 tablet by mouth every 4 (four) hours as needed.  60 tablet  0  . pravastatin (PRAVACHOL) 40 MG tablet Take 40 mg by mouth daily.      . traMADol (ULTRAM) 50 MG tablet Take 50 mg by mouth every 6 (six) hours as needed.       No current facility-administered medications for this visit.      No Known Allergies:  Family History  Problem Relation Age of Onset  . Clotting disorder Mother   . Diabetes Mellitus I Mother   . Prostate cancer Father   :  History   Social History  . Marital Status: Married    Spouse Name: N/A    Number of Children: N/A  . Years of Education: N/A   Occupational History  . Not on file.   Social History Main Topics  . Smoking status: Former Smoker -- 0.50 packs/day for 40 years    Types: Cigarettes  . Smokeless tobacco: Never Used     Comment: "quit smoking in 2013"  . Alcohol Use: No  . Drug Use: No  . Sexual Activity: No   Other Topics Concern  . Not on file   Social History Narrative  . No narrative on file  :  Pertinent  items are noted in HPI.  Exam: Blood pressure 156/67, pulse 62, temperature 98.5 F (36.9 C), temperature source Oral, resp. rate 18, height 5\' 10"  (1.778 m), weight 208 lb 9.6 oz (94.62 kg). General appearance: alert and cooperative Head: Normocephalic, without obvious abnormality Throat: lips, mucosa, and tongue normal; teeth and gums normal Neck: no adenopathy Back: symmetric, no curvature. ROM normal. No CVA tenderness. Resp: clear to auscultation bilaterally Cardio: regular rate and rhythm, S1, S2 normal, no murmur, click, rub or gallop GI: soft, non-tender; bowel sounds normal; no masses,  no organomegaly Extremities: extremities normal, atraumatic, no cyanosis or edema Pulses: 2+ and symmetric Skin: Skin color, texture, turgor normal. No rashes or lesions Lymph nodes: Cervical, supraclavicular, and axillary nodes normal.   IMPRESSION: 08/05/2014 Submassive bilateral acute pulmonary embolism with significant clot  burden bilaterally. There is evidence of right heart strain with  estimated RV/LV ratio of 1.73.  Positive for acute PE with CT evidence of right heart strain (RV/LV  Ratio = 1.73) consistent with at least submassive (intermediate  risk) PE. The presence of right heart strain has been associated  with an increased risk of morbidity and mortality. Consultation with  Pulmonary and Critical Care Medicine is recommended.  Findings were discussed with Dr. Venora Maples by telephone.      Assessment and Plan:    68 year old gentleman with the following issues:  1. Bilateral DVT that resulted in a bilateral PE with a large clot burden as well as significant right heart strain. He was treated with catheter directed lysis and subsequently on oral anticoagulant in the form of a Eliquis. He has tolerated it well and reports no respiratory symptoms. The natural course of inherited thrombophilia was discussed with the patient today. His hypercoagulable panel was reviewed and it was  only positive for a lupus anticoagulant. The implication of this finding was discussed with the patient and his wife. This could be a transient finding could be a true inherited thrombophilia. This could also be a false positive especially in the setting of a recent blood clot.  From a management standpoint, I agree with full dose anticoagulation for at least 6 months. I will repeat his lupus anticoagulant to ensure this is not a false-positive. I would probably recommend longer anticoagulate him if he is persistently positive lupus anticoagulant.  2. Anticipated hip replacement surgery. Given his recent large clot volume he well probably need pulmonary clearance before undergo another operation. To protect him from future blood clots, he will probably need shorter acting anticoagulation in the form of heparin or maybe Lovenox to be bridged before his procedure. I will also discuss with her the possibility of using an IVC filter although the value is unknown at this point.  3. Follow up: Will be in 3-4 months to assess his clinical status.

## 2014-08-28 NOTE — Progress Notes (Signed)
Checked in new pt with no financial concerns at this time.  Pt is here for a hematology concern so no financial assistance is required.  Pt has Raquel's card for future questions or concerns.

## 2014-08-28 NOTE — Telephone Encounter (Signed)
gv pt appt schedule for jan 2016. pt sent back to lab.

## 2014-08-28 NOTE — Progress Notes (Signed)
Please see consult note.  

## 2014-08-29 ENCOUNTER — Encounter: Payer: Self-pay | Admitting: Emergency Medicine

## 2014-08-29 ENCOUNTER — Ambulatory Visit (INDEPENDENT_AMBULATORY_CARE_PROVIDER_SITE_OTHER): Payer: Medicare Other | Admitting: Emergency Medicine

## 2014-08-29 VITALS — BP 158/90 | HR 74 | Temp 98.3°F | Ht 70.0 in | Wt 208.8 lb

## 2014-08-29 DIAGNOSIS — I2699 Other pulmonary embolism without acute cor pulmonale: Secondary | ICD-10-CM

## 2014-08-29 LAB — LUPUS ANTICOAGULANT PANEL
DRVVT: 40.8 secs (ref <42.9)
Lupus Anticoagulant: NOT DETECTED
PTT Lupus Anticoagulant: 39.9 secs (ref 28.0–43.0)

## 2014-08-29 MED ORDER — APIXABAN 5 MG PO TABS: ORAL_TABLET | ORAL | Status: DC

## 2014-08-29 NOTE — Assessment & Plan Note (Signed)
-   will continue eliquis. Duration will depend on his hypercoag w/u. I feel he needs at least 12 months.  - will need to be bridged with enoxaparin when time for his hip surgery - hypercoag w/u underway - will repeat his TTE in the future - rov March '16

## 2014-08-29 NOTE — Patient Instructions (Signed)
Please continue your eliquis. You will likely need to be on this medication for 12 months. Depending on your labwork and evaluation with Dr Osker Mason we may decide that you need to be on it for life.  Follow with Dr Osker Mason as planned.  You will need to be bridged with enoxaparin or heparin when it is time for you to get your hip surgery. Please speak to your orthopedist about this.  We will repeat your echocardiogram at some point in the future.  Follow with Dr Lamonte Sakai in March 2016 or sooner if you have any problems.

## 2014-08-29 NOTE — Progress Notes (Signed)
  HPI:  68 yo former smoker with a hx HTN, AAA, arthritis. He was admitted 9/6 -9/9 for massive PE with associated R heart strain and PAH. He underwent catheter directed lysis in IR with improvement in his clot burden and PAP. He was discharged on eliquis. He had an elevated anticardiolipin IgM, phosphatidylserine IgM, and positive lupus anticoagulant on 08/05/14, but this was when he had acute clot and was on anticoagulation. He is to follow with Dr Alen Blew about the hypercoag issue. He is doing well. No CP or dyspnea.    Past Medical History  Diagnosis Date  . Hypertension   . Hyperlipidemia   . Abdominal aortic aneurysm   . Arthritis     "hips; lower spine" (05/24/2014)     Family History  Problem Relation Age of Onset  . Clotting disorder Mother   . Diabetes Mellitus I Mother   . Prostate cancer Father      History   Social History  . Marital Status: Married    Spouse Name: N/A    Number of Children: N/A  . Years of Education: N/A   Occupational History  . Not on file.   Social History Main Topics  . Smoking status: Former Smoker -- 0.50 packs/day for 40 years    Types: Cigarettes  . Smokeless tobacco: Never Used     Comment: "quit smoking in 2013"  . Alcohol Use: No  . Drug Use: No  . Sexual Activity: No   Other Topics Concern  . Not on file   Social History Narrative  . No narrative on file     No Known Allergies   Outpatient Prescriptions Prior to Visit  Medication Sig Dispense Refill  . apixaban (ELIQUIS) 5 MG TABS tablet 2 tabs by mouth twice daily x 6 days then 1 tab by mouth twice daily  70 tablet  0  . cyclobenzaprine (FLEXERIL) 5 MG tablet Take 5 mg by mouth daily as needed for muscle spasms.      Marland Kitchen oxyCODONE-acetaminophen (ROXICET) 5-325 MG per tablet Take 1 tablet by mouth every 4 (four) hours as needed.  60 tablet  0  . pravastatin (PRAVACHOL) 40 MG tablet Take 40 mg by mouth daily.      . traMADol (ULTRAM) 50 MG tablet Take 50 mg by mouth every 6  (six) hours as needed.       No facility-administered medications prior to visit.   Filed Vitals:   08/29/14 1129  BP: 158/90  Pulse: 74  Temp: 98.3 F (36.8 C)  TempSrc: Oral  Height: 5\' 10"  (1.778 m)  Weight: 208 lb 12.8 oz (94.711 kg)  SpO2: 96%   Gen: Pleasant, well-nourished, in no distress,  normal affect  ENT: No lesions,  mouth clear,  oropharynx clear, no postnasal drip  Neck: No JVD, no TMG, no carotid bruits  Lungs: No use of accessory muscles, clear without rales or rhonchi  Cardiovascular: RRR, heart sounds normal, no murmur or gallops, no peripheral edema  Musculoskeletal: No deformities, no cyanosis or clubbing  Neuro: alert, non focal  Skin: Warm, no lesions or rashes   Pulmonary embolism - will continue eliquis. Duration will depend on his hypercoag w/u. I feel he needs at least 12 months.  - will need to be bridged with enoxaparin when time for his hip surgery - hypercoag w/u underway - will repeat his TTE in the future - rov March '16

## 2014-09-14 ENCOUNTER — Other Ambulatory Visit: Payer: Self-pay

## 2014-09-18 DIAGNOSIS — Z471 Aftercare following joint replacement surgery: Secondary | ICD-10-CM | POA: Diagnosis not present

## 2014-09-18 DIAGNOSIS — Z96649 Presence of unspecified artificial hip joint: Secondary | ICD-10-CM | POA: Diagnosis not present

## 2014-12-19 DIAGNOSIS — Z96642 Presence of left artificial hip joint: Secondary | ICD-10-CM | POA: Diagnosis not present

## 2014-12-19 DIAGNOSIS — Z09 Encounter for follow-up examination after completed treatment for conditions other than malignant neoplasm: Secondary | ICD-10-CM | POA: Diagnosis not present

## 2014-12-25 ENCOUNTER — Ambulatory Visit (HOSPITAL_BASED_OUTPATIENT_CLINIC_OR_DEPARTMENT_OTHER): Payer: Medicare Other | Admitting: Oncology

## 2014-12-25 ENCOUNTER — Telehealth: Payer: Self-pay | Admitting: Oncology

## 2014-12-25 VITALS — BP 149/56 | HR 66 | Temp 98.3°F | Resp 18 | Ht 70.0 in | Wt 224.8 lb

## 2014-12-25 DIAGNOSIS — I2699 Other pulmonary embolism without acute cor pulmonale: Secondary | ICD-10-CM | POA: Diagnosis not present

## 2014-12-25 DIAGNOSIS — I82403 Acute embolism and thrombosis of unspecified deep veins of lower extremity, bilateral: Secondary | ICD-10-CM

## 2014-12-25 NOTE — Progress Notes (Signed)
Hematology and Oncology Follow Up Visit  Nicholas Hale [id number] 1946-03-13 69 y.o. 12/25/2014 10:35 AM   Principle Diagnosis: 69 year old gentleman diagnosed with bilateral DVT and bilateral PE in September 2015. This believed to be provoked by hip replacement operation in June 2015. He did have a positive lupus anticoagulant which resolved with repeat testing.   Prior Therapy: He is status post catheter directed lysis of his blood clot and subsequently chronic anticoagulation with Eliquis.   Current therapy: Full dose anticoagulation with Eliquis.    Interim History:  Nicholas Hale presents today for a follow-up visit. Since the last visit, he feels fairly well without any new symptoms. He does not report any chest pain, shortness of breath or difficulty breathing. He does not report any bleeding complication related to Eliquis.  He is reporting increased hip pain but able to be mobile and performs activities of daily living. He did not report any headaches or blurry vision or syncope. He does not report any chest pain, shortness of breath, leg edema. He does not report any cough, wheezing or difficulty breathing. He did not report any nausea, vomiting, hematochezia, melena. His laboratory frequency, urgency, hesitancy. He is not report any lymphadenopathy or petechiae. He does not report any skeletal complaints. Rest of his review of systems unremarkable.  Medications: I have reviewed the patient's current medications.  Current Outpatient Prescriptions  Medication Sig Dispense Refill  . apixaban (ELIQUIS) 5 MG TABS tablet 1 tab by mouth twice daily 60 tablet 5  . cyclobenzaprine (FLEXERIL) 5 MG tablet Take 5 mg by mouth daily as needed for muscle spasms.    Marland Kitchen oxyCODONE-acetaminophen (ROXICET) 5-325 MG per tablet Take 1 tablet by mouth every 4 (four) hours as needed. 60 tablet 0  . pravastatin (PRAVACHOL) 40 MG tablet Take 40 mg by mouth daily.    . traMADol (ULTRAM) 50 MG tablet Take 50 mg  by mouth every 6 (six) hours as needed.     No current facility-administered medications for this visit.     Allergies: No Known Allergies  Past Medical History, Surgical history, Social history, and Family History were reviewed and updated.   Physical Exam: Blood pressure 149/56, pulse 66, temperature 98.3 F (36.8 C), temperature source Oral, resp. rate 18, height 5\' 10"  (1.778 m), weight 224 lb 12.8 oz (101.969 kg), SpO2 98 %. ECOG: 0 General appearance: alert and cooperative Head: Normocephalic, without obvious abnormality Neck: no adenopathy Lymph nodes: Cervical, supraclavicular, and axillary nodes normal. Heart:regular rate and rhythm, S1, S2 normal, no murmur, click, rub or gallop Lung:chest clear, no wheezing, rales, normal symmetric air entry Abdomin: soft, non-tender, without masses or organomegaly EXT:no erythema, induration, or nodules   Lab Results: Lab Results  Component Value Date   WBC 7.5 08/08/2014   HGB 13.1 08/08/2014   HCT 38.6* 08/08/2014   MCV 93.5 08/08/2014   PLT 164 08/08/2014     Chemistry      Component Value Date/Time   NA 140 08/05/2014 1323   K 3.9 08/05/2014 1323   CL 101 08/05/2014 1323   CO2 24 08/05/2014 1323   BUN 20 08/05/2014 1323   CREATININE 0.97 08/05/2014 1323      Component Value Date/Time   CALCIUM 9.1 08/05/2014 1323       Results for Nicholas Hale, Nicholas Hale (Nicholas Hale) as of 12/25/2014 10:20  Ref. Range 08/28/2014 14:19  PTT Lupus Anticoagulant Latest Range: 28.0-43.0 secs 39.9  PTTLA Confirmation Latest Range: <8.0 secs NOT APPL  PTTLA  4:1 Mix Latest Range: 28.0-43.0 secs NOT APPL  DRVVT Latest Range: <42.9 secs 40.8  Drvvt confirmation Latest Range: <1.15 Ratio NOT APPL  Lupus Anticoagulant Latest Range: NOT DETECTED  NOT DETECTED  DRVVT 1:1 Mix Latest Range: <42.9 secs NOT APPL      Impression and Plan:   69 year old gentleman with the following issues:  1. Bilateral DVT that resulted in a bilateral PE with  a large clot burden as well as significant right heart strain. He was treated with catheter directed lysis and subsequently on oral anticoagulant in the form of a Eliquis. He has tolerated it well and reports no respiratory symptoms.  His hypercoagulable panel did not reveal any inherited thrombophilia. He did have a positive lupus anticoagulant at one point but repeat testing on 08/28/2014 did not show any evidence of that. It is very possible that could've been a transient findings or a false positive.  For the time being, I recommend full dose anticoagulation for at least 6-12 months. After that, it is reasonable to consider discontinuing anticoagulation.   2. Anticipated hip replacement surgery. Given his recent large clot volume he well probably need pulmonary clearance before undergo another operation. I do think that he would require full dose anticoagulation perioperatively. He can certainly be bridged with Lovenox until her weight heparin to minimize the risk of developing postoperative thrombosis. I also discussed with him the possibility of an IVC filter placement but the limitations of this approach include protection for proximal blood clots among other complications.  My recommendation is not to proceed with any surgery for at least March or April 2016. Consideration of bridging therapy would be considered after that if he has to have that operation in that timeframe.  3. Follow-up: Will be in April 2016 to follow-up on his clinical status.   [facility], MD 1/26/201610:35 AM

## 2014-12-25 NOTE — Telephone Encounter (Signed)
gv adn rpinted appt sched and avs for pt for April

## 2015-01-07 ENCOUNTER — Other Ambulatory Visit: Payer: Self-pay | Admitting: Family Medicine

## 2015-01-07 DIAGNOSIS — I714 Abdominal aortic aneurysm, without rupture, unspecified: Secondary | ICD-10-CM

## 2015-01-07 DIAGNOSIS — Z86711 Personal history of pulmonary embolism: Secondary | ICD-10-CM | POA: Diagnosis not present

## 2015-01-07 DIAGNOSIS — E782 Mixed hyperlipidemia: Secondary | ICD-10-CM | POA: Diagnosis not present

## 2015-01-07 DIAGNOSIS — Z0000 Encounter for general adult medical examination without abnormal findings: Secondary | ICD-10-CM | POA: Diagnosis not present

## 2015-01-07 DIAGNOSIS — I10 Essential (primary) hypertension: Secondary | ICD-10-CM | POA: Diagnosis not present

## 2015-01-07 DIAGNOSIS — Q6100 Congenital renal cyst, unspecified: Secondary | ICD-10-CM | POA: Diagnosis not present

## 2015-01-07 DIAGNOSIS — Z125 Encounter for screening for malignant neoplasm of prostate: Secondary | ICD-10-CM | POA: Diagnosis not present

## 2015-01-07 DIAGNOSIS — M1990 Unspecified osteoarthritis, unspecified site: Secondary | ICD-10-CM | POA: Diagnosis not present

## 2015-01-07 DIAGNOSIS — Z23 Encounter for immunization: Secondary | ICD-10-CM | POA: Diagnosis not present

## 2015-01-07 DIAGNOSIS — N281 Cyst of kidney, acquired: Secondary | ICD-10-CM

## 2015-01-10 ENCOUNTER — Other Ambulatory Visit: Payer: Self-pay | Admitting: Family Medicine

## 2015-01-10 ENCOUNTER — Ambulatory Visit
Admission: RE | Admit: 2015-01-10 | Discharge: 2015-01-10 | Disposition: A | Discharge status: Home / Self Care | Payer: Medicare Other | Source: Ambulatory Visit | Attending: Family Medicine | Admitting: Family Medicine

## 2015-01-10 DIAGNOSIS — I714 Abdominal aortic aneurysm, without rupture, unspecified: Secondary | ICD-10-CM

## 2015-01-10 DIAGNOSIS — N281 Cyst of kidney, acquired: Secondary | ICD-10-CM

## 2015-02-06 DIAGNOSIS — H2513 Age-related nuclear cataract, bilateral: Secondary | ICD-10-CM | POA: Diagnosis not present

## 2015-02-12 ENCOUNTER — Ambulatory Visit: Payer: Medicare Other | Admitting: Emergency Medicine

## 2015-03-08 ENCOUNTER — Encounter: Payer: Self-pay | Admitting: Emergency Medicine

## 2015-03-08 ENCOUNTER — Ambulatory Visit (INDEPENDENT_AMBULATORY_CARE_PROVIDER_SITE_OTHER): Payer: Medicare Other | Admitting: Emergency Medicine

## 2015-03-08 VITALS — BP 128/78 | HR 82 | Ht 71.0 in | Wt 212.0 lb

## 2015-03-08 DIAGNOSIS — I2699 Other pulmonary embolism without acute cor pulmonale: Secondary | ICD-10-CM | POA: Diagnosis not present

## 2015-03-08 NOTE — Assessment & Plan Note (Signed)
We will need to continue your Eliquis until September 2016. After that we should be able to stop  He should be able to have your right hip surgery with a heparin bridge. In fact I recommend that we do this before you are off Eliquis completely as this may offer you additional protection from a recurrent clot after surgery Follow with Dr Lamonte Sakai in September 2016

## 2015-03-08 NOTE — Progress Notes (Signed)
HPI:  69 yo former smoker with a hx HTN, AAA, arthritis. He was admitted 9/6 -9/9 for massive PE with associated R heart strain and PAH. He underwent catheter directed lysis in IR with improvement in his clot burden and PAP. He was discharged on eliquis. He had an elevated anticardiolipin IgM, phosphatidylserine IgM, and positive lupus anticoagulant on 08/05/14, but this was when he had acute clot and was on anticoagulation. He is to follow with Dr Alen Blew about the hypercoag issue. He is doing well. No CP or dyspnea.   ROV 03/08/15 -- follow up for hx large PE with R heart strain and lytics. He has ben on Eliquis and has tolerated. Dr Alen Blew repeated his hypercoag panel and he did not have lupus anti-coagulant. He needs 12 months of anticoag and then can stop.  He needs to have a R hip sgy and wants me to review with Dr Mayer Camel. I believe it may be better for him to get this done while he is still going to be anticoagulated afterwards.                                                                     Past Medical History  Diagnosis Date  . Hypertension   . Hyperlipidemia   . Abdominal aortic aneurysm   . Arthritis     "hips; lower spine" (05/24/2014)     Family History  Problem Relation Age of Onset  . Clotting disorder Mother   . Diabetes Mellitus I Mother   . Prostate cancer Father      History   Social History  . Marital Status: Married    Spouse Name: N/A  . Number of Children: N/A  . Years of Education: N/A   Occupational History  . Not on file.   Social History Main Topics  . Smoking status: Former Smoker -- 0.50 packs/day for 40 years    Types: Cigarettes  . Smokeless tobacco: Never Used     Comment: "quit smoking in 2013"  . Alcohol Use: No  . Drug Use: No  . Sexual Activity: No   Other Topics Concern  . Not on file   Social History Narrative     No Known Allergies   Outpatient Prescriptions Prior to Visit  Medication Sig Dispense Refill  . apixaban (ELIQUIS)  5 MG TABS tablet 1 tab by mouth twice daily 60 tablet 5  . cyclobenzaprine (FLEXERIL) 5 MG tablet Take 5 mg by mouth daily as needed for muscle spasms.    Marland Kitchen oxyCODONE-acetaminophen (ROXICET) 5-325 MG per tablet Take 1 tablet by mouth every 4 (four) hours as needed. 60 tablet 0  . pravastatin (PRAVACHOL) 40 MG tablet Take 40 mg by mouth daily.    . traMADol (ULTRAM) 50 MG tablet Take 50 mg by mouth every 6 (six) hours as needed.     No facility-administered medications prior to visit.   Filed Vitals:   03/08/15 1616  BP: 128/78  Pulse: 82  Height: 5\' 11"  (1.803 m)  Weight: 212 lb (96.163 kg)  SpO2: 98%   Gen: Pleasant, well-nourished, in no distress,  normal affect  ENT: No lesions,  mouth clear,  oropharynx clear, no postnasal drip  Neck: No JVD, no TMG, no carotid  bruits  Lungs: No use of accessory muscles, clear without rales or rhonchi  Cardiovascular: RRR, heart sounds normal, no murmur or gallops, no peripheral edema  Musculoskeletal: No deformities, no cyanosis or clubbing  Neuro: alert, non focal  Skin: Warm, no lesions or rashes   PE (pulmonary embolism) We will need to continue your Eliquis until September 2016. After that we should be able to stop  He should be able to have your right hip surgery with a heparin bridge. In fact I recommend that we do this before you are off Eliquis completely as this may offer you additional protection from a recurrent clot after surgery Follow with Dr Lamonte Sakai in September 2016

## 2015-03-08 NOTE — Patient Instructions (Signed)
We will need to continue your Eliquis until September 2016. After that we should be able to stop  He should be able to have your right hip surgery with a heparin bridge. In fact I recommend that we do this before you are off Eliquis completely as this may offer you additional protection from a recurrent clot after surgery Follow with Dr Lamonte Sakai in September 2016

## 2015-03-09 ENCOUNTER — Other Ambulatory Visit: Payer: Self-pay | Admitting: Emergency Medicine

## 2015-03-13 ENCOUNTER — Ambulatory Visit (HOSPITAL_BASED_OUTPATIENT_CLINIC_OR_DEPARTMENT_OTHER): Payer: Medicare Other | Admitting: Oncology

## 2015-03-13 ENCOUNTER — Telehealth: Payer: Self-pay | Admitting: Oncology

## 2015-03-13 VITALS — BP 154/67 | HR 64 | Temp 98.1°F | Resp 20 | Ht 71.0 in | Wt 215.8 lb

## 2015-03-13 DIAGNOSIS — I2699 Other pulmonary embolism without acute cor pulmonale: Secondary | ICD-10-CM | POA: Diagnosis not present

## 2015-03-13 DIAGNOSIS — I82402 Acute embolism and thrombosis of unspecified deep veins of left lower extremity: Secondary | ICD-10-CM | POA: Diagnosis not present

## 2015-03-13 DIAGNOSIS — Z7901 Long term (current) use of anticoagulants: Secondary | ICD-10-CM | POA: Diagnosis not present

## 2015-03-13 DIAGNOSIS — I82401 Acute embolism and thrombosis of unspecified deep veins of right lower extremity: Secondary | ICD-10-CM | POA: Diagnosis not present

## 2015-03-13 NOTE — Progress Notes (Signed)
Hematology and Oncology Follow Up Visit  Nicholas Hale [id number] 14-Dec-1945 69 y.o. 03/13/2015 9:17 AM   Principle Diagnosis: 69 year old gentleman diagnosed with bilateral DVT and bilateral PE in September 2015. This believed to be provoked by hip replacement operation in June 2015. He did have a positive lupus anticoagulant which resolved with repeat testing.   Prior Therapy: He is status post catheter directed lysis of his blood clot and subsequently chronic anticoagulation with Eliquis.   Current therapy: Full dose anticoagulation with Eliquis.    Interim History:  Nicholas Hale presents today for a follow-up visit. Since the last visit, he continues to do well. He has not reported any bleeding or clotting episodes. He continues to have hip pain and knee pain which is chronic in nature. He does not report any chest pain, shortness of breath or difficulty breathing. He does not report any bleeding complication related to Eliquis.    He did not report any headaches or blurry vision or syncope. He does not report any chest pain, shortness of breath, leg edema. He does not report any cough, wheezing or difficulty breathing. He did not report any nausea, vomiting, hematochezia, melena. His laboratory frequency, urgency, hesitancy. He is not report any lymphadenopathy or petechiae. He does not report any skeletal complaints. Rest of his review of systems unremarkable.  Medications: I have reviewed the patient's current medications.  Current Outpatient Prescriptions  Medication Sig Dispense Refill  . cyclobenzaprine (FLEXERIL) 5 MG tablet Take 5 mg by mouth daily as needed for muscle spasms.    Marland Kitchen ELIQUIS 5 MG TABS tablet TAKE ONE TABLET BY MOUTH TWICE DAILY 60 tablet 5  . oxyCODONE-acetaminophen (ROXICET) 5-325 MG per tablet Take 1 tablet by mouth every 4 (four) hours as needed. 60 tablet 0  . pravastatin (PRAVACHOL) 40 MG tablet Take 40 mg by mouth daily.    . traMADol (ULTRAM) 50 MG tablet Take  50 mg by mouth every 6 (six) hours as needed.     No current facility-administered medications for this visit.     Allergies: No Known Allergies  Past Medical History, Surgical history, Social history, and Family History were reviewed and updated.   Physical Exam: Blood pressure 154/67, pulse 64, temperature 98.1 F (36.7 C), temperature source Oral, resp. rate 20, height 5\' 11"  (1.803 m), weight 215 lb 12.8 oz (97.886 kg), SpO2 100 %. ECOG: 0 General appearance: alert and cooperative Head: Normocephalic, without obvious abnormality Neck: no adenopathy Lymph nodes: Cervical, supraclavicular, and axillary nodes normal. Heart:regular rate and rhythm, S1, S2 normal, no murmur, click, rub or gallop Lung:chest clear, no wheezing, rales, normal symmetric air entry Abdomin: soft, non-tender, without masses or organomegaly EXT:no erythema, induration, or nodules   Lab Results: Lab Results  Component Value Date   WBC 7.5 08/08/2014   HGB 13.1 08/08/2014   HCT 38.6* 08/08/2014   MCV 93.5 08/08/2014   PLT 164 08/08/2014     Chemistry      Component Value Date/Time   NA 140 08/05/2014 1323   K 3.9 08/05/2014 1323   CL 101 08/05/2014 1323   CO2 24 08/05/2014 1323   BUN 20 08/05/2014 1323   CREATININE 0.97 08/05/2014 1323      Component Value Date/Time   CALCIUM 9.1 08/05/2014 1323       Results for KENDRIC, SINDELAR (MRN [id number]) as of 12/25/2014 10:20  Ref. Range 08/28/2014 14:19  PTT Lupus Anticoagulant Latest Range: 28.0-43.0 secs 39.9  PTTLA Confirmation Latest Range: <8.0  secs NOT APPL  PTTLA 4:1 Mix Latest Range: 28.0-43.0 secs NOT APPL  DRVVT Latest Range: <42.9 secs 40.8  Drvvt confirmation Latest Range: <1.15 Ratio NOT APPL  Lupus Anticoagulant Latest Range: NOT DETECTED  NOT DETECTED  DRVVT 1:1 Mix Latest Range: <42.9 secs NOT APPL      Impression and Plan:   69 year old gentleman with the following issues:  1. Bilateral DVT that resulted in a bilateral  PE with a large clot burden as well as significant right heart strain. He was treated with catheter directed lysis and subsequently on oral anticoagulant in the form of a Eliquis. He has tolerated it well and reports no respiratory symptoms.  His hypercoagulable panel did not reveal any inherited thrombophilia. He did have a positive lupus anticoagulant at one point but repeat testing on 08/28/2014 did not show any evidence of that. It is very possible that could've been a transient findings or a false positive.  I agree with continuing full dose anticoagulation till September 2016.    2. Anticipated hip replacement surgery. I agree with Dr. Lamonte Sakai and performing the surgery while he is on full dose anticoagulation. He will be bridged with Lovenox to get him through the operation. Having a hip replacement surgery would increase his risk of thrombosis and would be better protected with full dose anticoagulation.  3. Follow-up: Will be in September 2016 to follow-up on his clinical status.   [facility], MD 4/13/20169:17 AM

## 2015-03-13 NOTE — Telephone Encounter (Signed)
gave and printed appt sched and avs for pt for Sept

## 2015-03-21 DIAGNOSIS — M169 Osteoarthritis of hip, unspecified: Secondary | ICD-10-CM | POA: Diagnosis not present

## 2015-03-28 ENCOUNTER — Telehealth: Payer: Self-pay | Admitting: Emergency Medicine

## 2015-03-28 NOTE — Telephone Encounter (Signed)
He doesn't have to be admitted pre-op. He can stop the eliquis 24h before surgery. Post-op, they will have to decide their comfort level about when to restart full anticoagulation and whether they want to do heparin first (since eliquis works immediately and has no antidote). Whenever they are comfortable restarting full anticoagulation they could in theory just start the eliquis alone, no bridge time is necessary.

## 2015-03-28 NOTE — Telephone Encounter (Signed)
Dr Lamonte Sakai, please advise on this.

## 2015-03-29 NOTE — Telephone Encounter (Signed)
lmtcb with Juliann Pulse at Dr Alfonzo Beers office to relay [employer].

## 2015-03-29 NOTE — Telephone Encounter (Signed)
Nicholas Hale returned call. Informed her of the recs per RB. Nicholas Hale stated she will print the recs out and give to Dr. Malva Cogan for review. Nothing further needed at this time.

## 2015-04-10 ENCOUNTER — Other Ambulatory Visit: Payer: Self-pay | Admitting: Orthopedic Surgery

## 2015-04-11 ENCOUNTER — Telehealth: Payer: Self-pay | Admitting: Emergency Medicine

## 2015-04-11 NOTE — Telephone Encounter (Signed)
Per 03/28/15 phone note: Nicholas Gobble, MD at 03/28/2015 5:44 PM     Status: Signed       Expand All Collapse All   He doesn't have to be admitted pre-op. He can stop the eliquis 24h before surgery. Post-op, they will have to decide their comfort level about when to restart full anticoagulation and whether they want to do heparin first (since eliquis works immediately and has no antidote). Whenever they are comfortable restarting full anticoagulation they could in theory just start the eliquis alone, no bridge time is necessary.       --  Called made spouse aware of above. Nothing further needed

## 2015-05-03 NOTE — Pre-Procedure Instructions (Addendum)
Nicholas Hale  05/03/2015      [facility] - [place], [place] - [address] [place] [address] Phone: [phone] Fax: [phone]    Your procedure is scheduled on Wednesday, May 15, 2015  Report to [facility] Admitting at 10:45 A.M.  Call this number if you have problems the morning of surgery:  [phone]   Remember: Follow doctors instructions regarding Eliquis  Do not eat food or drink liquids after midnight Tuesday, May 14, 2015  Take these medicines the morning of surgery with A SIP OF WATER : if needed: pain medication  Stop taking Aspirin, vitamins and herbal medications. Do not take any NSAIDs ie: Ibuprofen, Advil, Naproxen or any medication containing Aspirin ; stop 5 days prior to procedure ( friday, May 10, 2015 )     Eliqus per dr.      Lazaro Hale not wear jewelry, make-up or nail polish.  Do not wear lotions, powders, or perfumes.  You may not wear deodorant.  Do not shave 48 hours prior to surgery.  Men may shave face and neck.  Do not bring valuables to the hospital.  [facility] is not responsible for any belongings or valuables.  Contacts, dentures or bridgework may not be worn into surgery.  Leave your suitcase in the car.  After surgery it may be brought to your room.  For patients admitted to the hospital, discharge time will be determined by your treatment team.  Patients discharged the day of surgery will not be allowed to drive home.   Name and phone number of your driver:    Special instructions:   Special Instructions:Special Instructions: [facility] - Preparing for Surgery  Before surgery, you can play an important role.  Because skin is not sterile, your skin needs to be as free of germs as possible.  You can reduce the number of germs on you skin by washing with CHG (chlorahexidine gluconate) soap before surgery.  CHG is an antiseptic cleaner which kills germs and bonds with the skin  to continue killing germs even after washing.  Please DO NOT use if you have an allergy to CHG or antibacterial soaps.  If your skin becomes reddened/irritated stop using the CHG and inform your nurse when you arrive at Short Stay.  Do not shave (including legs and underarms) for at least 48 hours prior to the first CHG shower.  You may shave your face.  Please follow these instructions carefully:   1.  Shower with CHG Soap the night before surgery and the morning of Surgery.  2.  If you choose to wash your hair, wash your hair first as usual with your normal shampoo.  3.  After you shampoo, rinse your hair and body thoroughly to remove the Shampoo.  4.  Use CHG as you would any other liquid soap.  You can apply chg directly  to the skin and wash gently with scrungie or a clean washcloth.  5.  Apply the CHG Soap to your body ONLY FROM THE NECK DOWN.  Do not use on open wounds or open sores.  Avoid contact with your eyes, ears, mouth and genitals (private parts).  Wash genitals (private parts) with your normal soap.  6.  Wash thoroughly, paying special attention to the area where your surgery will be performed.  7.  Thoroughly rinse your body with warm water from the neck down.  8.  DO NOT shower/wash with your normal  soap after using and rinsing off the CHG Soap.  9.  Pat yourself dry with a clean towel.            10.  Wear clean pajamas.            11.  Place clean sheets on your bed the night of your first shower and do not sleep with pets.  Day of Surgery  Do not apply any lotions/deodorants the morning of surgery.  Please wear clean clothes to the hospital/surgery center.  Please read over the following fact sheets that you were given. Pain Booklet, Coughing and Deep Breathing, Blood Transfusion Information, Total Joint Packet, MRSA Information and Surgical Site Infection Prevention

## 2015-05-06 ENCOUNTER — Encounter (HOSPITAL_COMMUNITY)
Admission: RE | Admit: 2015-05-06 | Discharge: 2015-05-06 | Disposition: A | Discharge status: Home / Self Care | Payer: Medicare Other | Source: Ambulatory Visit | Attending: Orthopedic Surgery | Admitting: Orthopedic Surgery

## 2015-05-06 ENCOUNTER — Encounter (HOSPITAL_COMMUNITY): Payer: Self-pay

## 2015-05-06 DIAGNOSIS — J449 Chronic obstructive pulmonary disease, unspecified: Secondary | ICD-10-CM | POA: Diagnosis not present

## 2015-05-06 DIAGNOSIS — Z01818 Encounter for other preprocedural examination: Secondary | ICD-10-CM

## 2015-05-06 HISTORY — DX: Personal history of other venous thrombosis and embolism: Z86.718

## 2015-05-06 HISTORY — DX: Other pulmonary embolism without acute cor pulmonale: I26.99

## 2015-05-06 LAB — CBC WITH DIFFERENTIAL/PLATELET
Basophils Absolute: 0 10*3/uL (ref 0.0–0.1)
Basophils Relative: 1 % (ref 0–1)
EOS PCT: 4 % (ref 0–5)
Eosinophils Absolute: 0.3 10*3/uL (ref 0.0–0.7)
HCT: 49.6 % (ref 39.0–52.0)
HEMOGLOBIN: 16.8 g/dL (ref 13.0–17.0)
LYMPHS ABS: 1.6 10*3/uL (ref 0.7–4.0)
Lymphocytes Relative: 23 % (ref 12–46)
MCH: 32.7 pg (ref 26.0–34.0)
MCHC: 33.9 g/dL (ref 30.0–36.0)
MCV: 96.7 fL (ref 78.0–100.0)
MONOS PCT: 7 % (ref 3–12)
Monocytes Absolute: 0.5 10*3/uL (ref 0.1–1.0)
Neutro Abs: 4.5 10*3/uL (ref 1.7–7.7)
Neutrophils Relative %: 65 % (ref 43–77)
Platelets: 166 10*3/uL (ref 150–400)
RBC: 5.13 MIL/uL (ref 4.22–5.81)
RDW: 12.9 % (ref 11.5–15.5)
WBC: 6.9 10*3/uL (ref 4.0–10.5)

## 2015-05-06 LAB — URINALYSIS, ROUTINE W REFLEX MICROSCOPIC
Bilirubin Urine: NEGATIVE
GLUCOSE, UA: NEGATIVE mg/dL
Hgb urine dipstick: NEGATIVE
Ketones, ur: NEGATIVE mg/dL
Leukocytes, UA: NEGATIVE
Nitrite: NEGATIVE
PROTEIN: NEGATIVE mg/dL
SPECIFIC GRAVITY, URINE: 1.003 — AB (ref 1.005–1.030)
Urobilinogen, UA: 0.2 mg/dL (ref 0.0–1.0)
pH: 6 (ref 5.0–8.0)

## 2015-05-06 LAB — BASIC METABOLIC PANEL
Anion gap: 6 (ref 5–15)
BUN: 9 mg/dL (ref 6–20)
CHLORIDE: 112 mmol/L — AB (ref 101–111)
CO2: 23 mmol/L (ref 22–32)
Calcium: 8.8 mg/dL — ABNORMAL LOW (ref 8.9–10.3)
Creatinine, Ser: 1 mg/dL (ref 0.61–1.24)
GFR calc Af Amer: >60 mL/min (ref >60)
GFR calc non Af Amer: >60 mL/min (ref >60)
GLUCOSE: 103 mg/dL — AB (ref 65–99)
POTASSIUM: 4 mmol/L (ref 3.5–5.1)
Sodium: 141 mmol/L (ref 135–145)

## 2015-05-06 LAB — SURGICAL PCR SCREEN
MRSA, PCR: NEGATIVE
Staphylococcus aureus: NEGATIVE

## 2015-05-06 LAB — APTT: aPTT: 35 seconds (ref 24–37)

## 2015-05-06 LAB — TYPE AND SCREEN
ABO/RH(D): O POS
Antibody Screen: NEGATIVE

## 2015-05-06 LAB — PROTIME-INR
INR: 1.08 (ref 0.00–1.49)
Prothrombin Time: 14.2 seconds (ref 11.6–15.2)

## 2015-05-07 DIAGNOSIS — M25551 Pain in right hip: Secondary | ICD-10-CM | POA: Diagnosis not present

## 2015-05-07 DIAGNOSIS — M1611 Unilateral primary osteoarthritis, right hip: Secondary | ICD-10-CM | POA: Diagnosis not present

## 2015-05-07 NOTE — Progress Notes (Signed)
Anesthesia Chart Review:  Pt is 69 year old male scheduled for R total hip arthroplasty on 05/15/2015 with Dr. Mayer Camel.   PMH includes: HTN, AAA (last [place] 01/10/15, max diameter 3.7cm, repeat US recommended in 2 years), hyperlipidemia, bilateral DVT that resulted in bilateral PE "with large clot burden" 07/2014. Former smoker. BMI 29. S/p L THA 05/23/14.   Pt is followed by Dr. Lamonte Sakai with pulmonology and Dr. Alen Blew with hem/onc for DVT/PE.   Medications include: eliquis, pravastatin. Pt to stop eliquis 24 hours prior to surgery per Dr. Lamonte Sakai (see telephone encounter dated 03/28/15). Dr. Lamonte Sakai also notes "Post-op, they will have to decide their comfort level about when to restart full anticoagulation and whether they want to do heparin first (since eliquis works immediately and has no antidote). Whenever they are comfortable restarting full anticoagulation they could in theory just start the eliquis alone, no bridge time is necessary".  Preoperative labs reviewed.    Chest x-ray 05/06/2015 reviewed. COPD. There is no active cardiopulmonary disease.   EKG 05/06/2015: NSR  Echo 08/07/2014: - Left ventricle: The cavity size was normal. Systolic function was normal. The estimated ejection fraction was in the range of 60% to 65%. Wall motion was normal; there were no regional wall motion abnormalities. - Mitral valve: There was mild regurgitation. - Right ventricle: Systolic function was mildly reduced. Moderate hypokinesis of the RV apex.  If no changes, I anticipate pt can proceed with surgery as scheduled.   Willeen Cass, FNP-BC [facility] Short Stay Surgical Center/Anesthesiology Phone: [phone] 05/07/2015 3:06 PM

## 2015-05-10 DIAGNOSIS — M1611 Unilateral primary osteoarthritis, right hip: Secondary | ICD-10-CM | POA: Diagnosis present

## 2015-05-10 NOTE — H&P (Signed)
TOTAL HIP ADMISSION H&P  Patient is admitted for right total hip arthroplasty.  Subjective:  Chief Complaint: right hip pain  HPI: Nicholas Hale, 69 y.o. male, has a history of pain and functional disability in the right hip(s) due to arthritis and patient has failed non-surgical conservative treatments for greater than 12 weeks to include NSAID's and/or analgesics, supervised PT with diminished ADL's post treatment, use of assistive devices, weight reduction as appropriate and activity modification.  Onset of symptoms was gradual starting 1 years ago with gradually worsening course since that time.The patient noted no past surgery on the right hip(s).  Patient currently rates pain in the right hip at 10 out of 10 with activity. Patient has night pain, worsening of pain with activity and weight bearing, pain that interfers with activities of daily living and pain with passive range of motion. Patient has evidence of joint space narrowing by imaging studies. This condition presents safety issues increasing the risk of falls. There is no current active infection.  Patient Active Problem List   Diagnosis Date Noted  . PE (pulmonary embolism) 08/05/2014  . Pulmonary embolism 08/05/2014  . Arthritis of left hip 05/23/2014  . Hypertension 05/15/2014  . Hyperlipidemia 05/15/2014   Past Medical History  Diagnosis Date  . Hypertension   . Hyperlipidemia   . Abdominal aortic aneurysm   . Arthritis     "hips; lower spine" (05/24/2014)  . Personal history of DVT (deep vein thrombosis) 9/15  . PE (pulmonary embolism) 9/15    Past Surgical History  Procedure Laterality Date  . Hemiarthroplasty shoulder fracture Left 12/2008  . Appendectomy  1978  . Anterior fusion cervical spine  ~ 2005  . Colonoscopy    . Total hip arthroplasty Left 05/23/2014    Procedure: TOTAL HIP ARTHROPLASTY;  Surgeon: Kerin Salen, MD;  Location: [facility];  Service: Orthopedics;  Laterality: Left;  . Joint replacement    .  Inguinal hernia repair Bilateral 1992    No prescriptions prior to admission   No Known Allergies  History  Substance Use Topics  . Smoking status: Former Smoker -- 0.50 packs/day for 40 years    Types: Cigarettes    Quit date: 05/05/2009  . Smokeless tobacco: Never Used  . Alcohol Use: No    Family History  Problem Relation Age of Onset  . Clotting disorder Mother   . Diabetes Mellitus I Mother   . Prostate cancer Father      Review of Systems  Constitutional: Negative.   HENT: Negative.   Eyes:       Glasses  Respiratory: Negative.   Cardiovascular:       HTN  Gastrointestinal: Negative.   Genitourinary: Negative.   Musculoskeletal: Positive for joint pain.  Skin: Negative.   Neurological: Negative.   Endo/Heme/Allergies:       Hx of blood clots and pt currently on eloquis  Psychiatric/Behavioral: Negative.     Objective:  Physical Exam  Constitutional: He is oriented to person, place, and time. He appears well-developed and well-nourished.  HENT:  Head: Normocephalic and atraumatic.  Eyes: Pupils are equal, round, and reactive to light.  Neck: Normal range of motion. Neck supple.  Cardiovascular: Intact distal pulses.   Respiratory: Effort normal.  Musculoskeletal: He exhibits tenderness.  The left total hip has  Internal and external rotates 30 each without difficulty.  Foot tap is negative surgical scar is well-healed.  The right hip internally rotates 5 with discomfort.  Foot tap  is negative.  External rotation is to 30.  His skin is intact.  Normal pulses to the feet.  Normal sensation of the feet.  Motors are intact.  Neurological: He is alert and oriented to person, place, and time.  Skin: Skin is warm and dry.  Psychiatric: He has a normal mood and affect. His behavior is normal. Judgment and thought content normal.    Vital signs in last 24 hours:    Labs:   Estimated body mass index is 30.11 kg/(m^2) as calculated from the following:    Height as of 03/13/15: '5\' 11"'$  (1.803 m).   Weight as of 03/13/15: 97.886 kg (215 lb 12.8 oz).   Imaging Review Plain radiographs demonstrate AP pelvis and crosstable lateral of the left total hip shows well-placed well fixed Pinnacle shell polyethylene liner SROM stem and a 59m head.  On the right side his arthritis has progressed. He is down to his last millimeter of bone superiorly between the femoral head and the acetabulum.  Assessment/Plan:  End-stage arthritis right hip and a patient who is presently on blood thinners after bilateral lower extremity DVTs 3 months after his left total hip last year.  The patient history, physical examination, clinical judgement of the provider and imaging studies are consistent with end stage degenerative joint disease of the right hip(s) and total hip arthroplasty is deemed medically necessary. The treatment options including medical management, injection therapy, arthroscopy and arthroplasty were discussed at length. The risks and benefits of total hip arthroplasty were presented and reviewed. The risks due to aseptic loosening, infection, stiffness, dislocation/subluxation,  thromboembolic complications and other imponderables were discussed.  The patient acknowledged the explanation, agreed to proceed with the plan and consent was signed. Patient is being admitted for inpatient treatment for surgery, pain control, PT, OT, prophylactic antibiotics, VTE prophylaxis, progressive ambulation and ADL's and discharge planning.The patient is planning to be discharged home with home health services

## 2015-05-14 MED ORDER — TRANEXAMIC ACID 1000 MG/10ML IV SOLN
2000.0000 mg | Freq: Once | INTRAVENOUS | Status: AC
  Administered 2015-05-15: 2000 mg via TOPICAL
  Filled 2015-05-14: qty 20

## 2015-05-15 ENCOUNTER — Inpatient Hospital Stay (HOSPITAL_COMMUNITY): Payer: Medicare Other

## 2015-05-15 ENCOUNTER — Inpatient Hospital Stay (HOSPITAL_COMMUNITY)
Admission: RE | Admit: 2015-05-15 | Discharge: 2015-05-17 | DRG: 470 | Disposition: A | Discharge status: Home Health | Payer: Medicare Other | Source: Ambulatory Visit | Attending: Orthopedic Surgery | Admitting: Orthopedic Surgery

## 2015-05-15 ENCOUNTER — Inpatient Hospital Stay (HOSPITAL_COMMUNITY): Payer: Medicare Other | Admitting: Emergency Medicine

## 2015-05-15 ENCOUNTER — Inpatient Hospital Stay (HOSPITAL_COMMUNITY): Payer: Medicare Other | Admitting: Certified Registered"

## 2015-05-15 ENCOUNTER — Encounter (HOSPITAL_COMMUNITY)
Admission: RE | Disposition: A | Discharge status: Home Health | Payer: Self-pay | Source: Ambulatory Visit | Attending: Orthopedic Surgery

## 2015-05-15 ENCOUNTER — Encounter (HOSPITAL_COMMUNITY): Payer: Self-pay | Admitting: *Deleted

## 2015-05-15 DIAGNOSIS — Z79899 Other long term (current) drug therapy: Secondary | ICD-10-CM | POA: Diagnosis not present

## 2015-05-15 DIAGNOSIS — Z86718 Personal history of other venous thrombosis and embolism: Secondary | ICD-10-CM | POA: Diagnosis not present

## 2015-05-15 DIAGNOSIS — Z87891 Personal history of nicotine dependence: Secondary | ICD-10-CM

## 2015-05-15 DIAGNOSIS — Z833 Family history of diabetes mellitus: Secondary | ICD-10-CM

## 2015-05-15 DIAGNOSIS — I10 Essential (primary) hypertension: Secondary | ICD-10-CM | POA: Diagnosis present

## 2015-05-15 DIAGNOSIS — D62 Acute posthemorrhagic anemia: Secondary | ICD-10-CM | POA: Diagnosis not present

## 2015-05-15 DIAGNOSIS — Z471 Aftercare following joint replacement surgery: Secondary | ICD-10-CM | POA: Diagnosis not present

## 2015-05-15 DIAGNOSIS — Z96642 Presence of left artificial hip joint: Secondary | ICD-10-CM | POA: Diagnosis present

## 2015-05-15 DIAGNOSIS — Z96641 Presence of right artificial hip joint: Secondary | ICD-10-CM | POA: Diagnosis not present

## 2015-05-15 DIAGNOSIS — M1611 Unilateral primary osteoarthritis, right hip: Secondary | ICD-10-CM | POA: Diagnosis not present

## 2015-05-15 DIAGNOSIS — E785 Hyperlipidemia, unspecified: Secondary | ICD-10-CM | POA: Diagnosis present

## 2015-05-15 DIAGNOSIS — I714 Abdominal aortic aneurysm, without rupture: Secondary | ICD-10-CM | POA: Diagnosis present

## 2015-05-15 DIAGNOSIS — M25551 Pain in right hip: Secondary | ICD-10-CM | POA: Diagnosis not present

## 2015-05-15 DIAGNOSIS — M169 Osteoarthritis of hip, unspecified: Secondary | ICD-10-CM | POA: Diagnosis not present

## 2015-05-15 DIAGNOSIS — Z86711 Personal history of pulmonary embolism: Secondary | ICD-10-CM | POA: Diagnosis not present

## 2015-05-15 DIAGNOSIS — Z96649 Presence of unspecified artificial hip joint: Secondary | ICD-10-CM

## 2015-05-15 DIAGNOSIS — Z7902 Long term (current) use of antithrombotics/antiplatelets: Secondary | ICD-10-CM | POA: Diagnosis not present

## 2015-05-15 HISTORY — PX: TOTAL HIP ARTHROPLASTY: SHX124

## 2015-05-15 SURGERY — ARTHROPLASTY, HIP, TOTAL,POSTERIOR APPROACH
Anesthesia: General | Site: Hip | Laterality: Right

## 2015-05-15 MED ORDER — NEOSTIGMINE METHYLSULFATE 10 MG/10ML IV SOLN
INTRAVENOUS | Status: DC | PRN
  Administered 2015-05-15: 3 mg via INTRAVENOUS

## 2015-05-15 MED ORDER — PROPOFOL 10 MG/ML IV BOLUS
INTRAVENOUS | Status: AC
  Filled 2015-05-15: qty 20

## 2015-05-15 MED ORDER — LIDOCAINE HCL (CARDIAC) 20 MG/ML IV SOLN
INTRAVENOUS | Status: DC | PRN
  Administered 2015-05-15: 70 mg via INTRAVENOUS

## 2015-05-15 MED ORDER — CEFAZOLIN SODIUM-DEXTROSE 2-3 GM-% IV SOLR
INTRAVENOUS | Status: AC
  Administered 2015-05-15: 2 g via INTRAVENOUS
  Filled 2015-05-15: qty 50

## 2015-05-15 MED ORDER — KCL IN DEXTROSE-NACL 20-5-0.45 MEQ/L-%-% IV SOLN
INTRAVENOUS | Status: DC
  Administered 2015-05-15 – 2015-05-16 (×2): via INTRAVENOUS
  Filled 2015-05-15 (×2): qty 1000

## 2015-05-15 MED ORDER — PRAVASTATIN SODIUM 40 MG PO TABS
40.0000 mg | ORAL_TABLET | Freq: Every day | ORAL | Status: DC
Start: 2015-05-16 — End: 2015-05-17
  Administered 2015-05-16 – 2015-05-17 (×2): 40 mg via ORAL
  Filled 2015-05-15 (×2): qty 1

## 2015-05-15 MED ORDER — FENTANYL CITRATE (PF) 250 MCG/5ML IJ SOLN
INTRAMUSCULAR | Status: AC
  Filled 2015-05-15: qty 5

## 2015-05-15 MED ORDER — ACETAMINOPHEN 650 MG RE SUPP: 650.0000 mg | Freq: Four times a day (QID) | RECTAL | Status: DC | PRN

## 2015-05-15 MED ORDER — CEFAZOLIN SODIUM-DEXTROSE 2-3 GM-% IV SOLR: 2.0000 g | INTRAVENOUS | Status: DC

## 2015-05-15 MED ORDER — METOCLOPRAMIDE HCL 5 MG PO TABS: 5.0000 mg | ORAL_TABLET | Freq: Three times a day (TID) | ORAL | Status: DC | PRN

## 2015-05-15 MED ORDER — PROPOFOL 10 MG/ML IV BOLUS
INTRAVENOUS | Status: DC | PRN
  Administered 2015-05-15: 40 mg via INTRAVENOUS
  Administered 2015-05-15: 160 mg via INTRAVENOUS

## 2015-05-15 MED ORDER — HYDROMORPHONE HCL 1 MG/ML IJ SOLN
0.5000 mg | INTRAMUSCULAR | Status: DC | PRN
  Administered 2015-05-15 – 2015-05-16 (×2): 1 mg via INTRAVENOUS
  Filled 2015-05-15 (×2): qty 1

## 2015-05-15 MED ORDER — OXYCODONE HCL 5 MG PO TABS
5.0000 mg | ORAL_TABLET | ORAL | Status: DC | PRN
  Administered 2015-05-15 – 2015-05-17 (×9): 10 mg via ORAL
  Filled 2015-05-15 (×9): qty 2

## 2015-05-15 MED ORDER — PHENOL 1.4 % MT LIQD: 1.0000 | OROMUCOSAL | Status: DC | PRN

## 2015-05-15 MED ORDER — METHOCARBAMOL 1000 MG/10ML IJ SOLN
500.0000 mg | Freq: Four times a day (QID) | INTRAVENOUS | Status: DC | PRN
  Filled 2015-05-15: qty 5

## 2015-05-15 MED ORDER — DIPHENHYDRAMINE HCL 12.5 MG/5ML PO ELIX: 12.5000 mg | ORAL_SOLUTION | ORAL | Status: DC | PRN

## 2015-05-15 MED ORDER — GLYCOPYRROLATE 0.2 MG/ML IJ SOLN
INTRAMUSCULAR | Status: DC | PRN
  Administered 2015-05-15: 0.4 mg via INTRAVENOUS

## 2015-05-15 MED ORDER — OXYCODONE HCL 5 MG PO TABS
5.0000 mg | ORAL_TABLET | Freq: Once | ORAL | Status: AC | PRN
  Administered 2015-05-15: 5 mg via ORAL

## 2015-05-15 MED ORDER — HYDROMORPHONE HCL 1 MG/ML IJ SOLN
0.2500 mg | INTRAMUSCULAR | Status: DC | PRN
  Administered 2015-05-15 (×6): 0.5 mg via INTRAVENOUS

## 2015-05-15 MED ORDER — CHLORHEXIDINE GLUCONATE 4 % EX LIQD: 60.0000 mL | Freq: Once | CUTANEOUS | Status: DC

## 2015-05-15 MED ORDER — OXYCODONE HCL 5 MG/5ML PO SOLN: 5.0000 mg | Freq: Once | ORAL | Status: AC | PRN

## 2015-05-15 MED ORDER — HYDROMORPHONE HCL 1 MG/ML IJ SOLN
INTRAMUSCULAR | Status: AC
  Administered 2015-05-15: 0.5 mg via INTRAVENOUS
  Filled 2015-05-15: qty 2

## 2015-05-15 MED ORDER — MENTHOL 3 MG MT LOZG
1.0000 | LOZENGE | OROMUCOSAL | Status: DC | PRN
Start: 2015-05-15 — End: 2015-05-17

## 2015-05-15 MED ORDER — TRAMADOL HCL 50 MG PO TABS
50.0000 mg | ORAL_TABLET | Freq: Four times a day (QID) | ORAL | Status: DC | PRN
Start: 2015-05-15 — End: 2015-05-17

## 2015-05-15 MED ORDER — METHOCARBAMOL 500 MG PO TABS
500.0000 mg | ORAL_TABLET | Freq: Four times a day (QID) | ORAL | Status: DC | PRN
  Administered 2015-05-15 – 2015-05-17 (×6): 500 mg via ORAL
  Filled 2015-05-15 (×6): qty 1

## 2015-05-15 MED ORDER — GLYCOPYRROLATE 0.2 MG/ML IJ SOLN
INTRAMUSCULAR | Status: AC
  Filled 2015-05-15: qty 2

## 2015-05-15 MED ORDER — ACETAMINOPHEN 325 MG PO TABS: 650.0000 mg | ORAL_TABLET | Freq: Four times a day (QID) | ORAL | Status: DC | PRN

## 2015-05-15 MED ORDER — FENTANYL CITRATE (PF) 100 MCG/2ML IJ SOLN
INTRAMUSCULAR | Status: DC | PRN
  Administered 2015-05-15: 50 ug via INTRAVENOUS
  Administered 2015-05-15: 200 ug via INTRAVENOUS

## 2015-05-15 MED ORDER — SODIUM CHLORIDE 0.9 % IR SOLN
Status: DC | PRN
  Administered 2015-05-15: 1000 mL

## 2015-05-15 MED ORDER — HYDROMORPHONE HCL 1 MG/ML IJ SOLN
INTRAMUSCULAR | Status: AC
  Administered 2015-05-15: 0.5 mg via INTRAVENOUS
  Filled 2015-05-15: qty 1

## 2015-05-15 MED ORDER — LABETALOL HCL 5 MG/ML IV SOLN
INTRAVENOUS | Status: DC | PRN
  Administered 2015-05-15: 10 mg via INTRAVENOUS

## 2015-05-15 MED ORDER — BISACODYL 5 MG PO TBEC
5.0000 mg | DELAYED_RELEASE_TABLET | Freq: Every day | ORAL | Status: DC | PRN
Start: 2015-05-15 — End: 2015-05-17

## 2015-05-15 MED ORDER — BUPIVACAINE-EPINEPHRINE 0.5% -1:200000 IJ SOLN
INTRAMUSCULAR | Status: DC | PRN
  Administered 2015-05-15: 10 mL

## 2015-05-15 MED ORDER — ONDANSETRON HCL 4 MG PO TABS: 4.0000 mg | ORAL_TABLET | Freq: Four times a day (QID) | ORAL | Status: DC | PRN

## 2015-05-15 MED ORDER — ALUM & MAG HYDROXIDE-SIMETH 200-200-20 MG/5ML PO SUSP: 30.0000 mL | ORAL | Status: DC | PRN

## 2015-05-15 MED ORDER — ONDANSETRON HCL 4 MG/2ML IJ SOLN: 4.0000 mg | Freq: Four times a day (QID) | INTRAMUSCULAR | Status: DC | PRN

## 2015-05-15 MED ORDER — DOCUSATE SODIUM 100 MG PO CAPS
100.0000 mg | ORAL_CAPSULE | Freq: Two times a day (BID) | ORAL | Status: DC
  Administered 2015-05-15 – 2015-05-17 (×4): 100 mg via ORAL
  Filled 2015-05-15 (×4): qty 1

## 2015-05-15 MED ORDER — METOCLOPRAMIDE HCL 5 MG/ML IJ SOLN: 5.0000 mg | Freq: Three times a day (TID) | INTRAMUSCULAR | Status: DC | PRN

## 2015-05-15 MED ORDER — ZOLPIDEM TARTRATE 5 MG PO TABS: 5.0000 mg | ORAL_TABLET | Freq: Every evening | ORAL | Status: DC | PRN

## 2015-05-15 MED ORDER — MIDAZOLAM HCL 2 MG/2ML IJ SOLN
INTRAMUSCULAR | Status: AC
  Filled 2015-05-15: qty 2

## 2015-05-15 MED ORDER — DEXAMETHASONE SODIUM PHOSPHATE 10 MG/ML IJ SOLN
10.0000 mg | Freq: Once | INTRAMUSCULAR | Status: AC
  Administered 2015-05-16: 10 mg via INTRAVENOUS
  Filled 2015-05-15: qty 1

## 2015-05-15 MED ORDER — FLEET ENEMA 7-19 GM/118ML RE ENEM: 1.0000 | ENEMA | Freq: Once | RECTAL | Status: AC | PRN

## 2015-05-15 MED ORDER — SENNOSIDES-DOCUSATE SODIUM 8.6-50 MG PO TABS: 1.0000 | ORAL_TABLET | Freq: Every evening | ORAL | Status: DC | PRN

## 2015-05-15 MED ORDER — ONDANSETRON HCL 4 MG/2ML IJ SOLN
INTRAMUSCULAR | Status: DC | PRN
  Administered 2015-05-15: 4 mg via INTRAVENOUS

## 2015-05-15 MED ORDER — ROCURONIUM BROMIDE 100 MG/10ML IV SOLN
INTRAVENOUS | Status: DC | PRN
  Administered 2015-05-15: 50 mg via INTRAVENOUS

## 2015-05-15 MED ORDER — ACETAMINOPHEN 325 MG PO TABS: 325.0000 mg | ORAL_TABLET | ORAL | Status: DC | PRN

## 2015-05-15 MED ORDER — APIXABAN 5 MG PO TABS
5.0000 mg | ORAL_TABLET | Freq: Two times a day (BID) | ORAL | Status: DC
  Administered 2015-05-15 – 2015-05-17 (×4): 5 mg via ORAL
  Filled 2015-05-15 (×5): qty 1

## 2015-05-15 MED ORDER — NEOSTIGMINE METHYLSULFATE 10 MG/10ML IV SOLN
INTRAVENOUS | Status: AC
  Filled 2015-05-15: qty 1

## 2015-05-15 MED ORDER — MIDAZOLAM HCL 5 MG/5ML IJ SOLN
INTRAMUSCULAR | Status: DC | PRN
  Administered 2015-05-15: 2 mg via INTRAVENOUS

## 2015-05-15 MED ORDER — OXYCODONE HCL 5 MG PO TABS
ORAL_TABLET | ORAL | Status: AC
  Filled 2015-05-15: qty 3

## 2015-05-15 MED ORDER — ONDANSETRON HCL 4 MG/2ML IJ SOLN
INTRAMUSCULAR | Status: AC
  Filled 2015-05-15: qty 2

## 2015-05-15 MED ORDER — METHOCARBAMOL 500 MG PO TABS
ORAL_TABLET | ORAL | Status: AC
  Filled 2015-05-15: qty 1

## 2015-05-15 MED ORDER — LACTATED RINGERS IV SOLN
INTRAVENOUS | Status: DC
  Administered 2015-05-15 (×3): via INTRAVENOUS

## 2015-05-15 MED ORDER — ACETAMINOPHEN 160 MG/5ML PO SOLN
325.0000 mg | ORAL | Status: DC | PRN
  Filled 2015-05-15: qty 20.3

## 2015-05-15 SURGICAL SUPPLY — 56 items
BLADE SAW SGTL 18X1.27X75 (BLADE) ×2 IMPLANT
BLADE SAW SGTL 18X1.27X75MM (BLADE) ×1
BRUSH FEMORAL CANAL (MISCELLANEOUS) IMPLANT
CAPT HIP TOTAL 2 ×2 IMPLANT
COVER BACK TABLE 24X17X13 BIG (DRAPES) IMPLANT
COVER SURGICAL LIGHT HANDLE (MISCELLANEOUS) ×4 IMPLANT
DRAPE IMP U-DRAPE 54X76 (DRAPES) ×3 IMPLANT
DRAPE ORTHO SPLIT 77X108 STRL (DRAPES) ×3
DRAPE PROXIMA HALF (DRAPES) ×3 IMPLANT
DRAPE SURG ORHT 6 SPLT 77X108 (DRAPES) ×1 IMPLANT
DRAPE U-SHAPE 47X51 STRL (DRAPES) ×3 IMPLANT
DRILL BIT 7/64X5 (BIT) ×3 IMPLANT
DRSG AQUACEL AG ADV 3.5X10 (GAUZE/BANDAGES/DRESSINGS) ×3 IMPLANT
DURAPREP 26ML APPLICATOR (WOUND CARE) ×3 IMPLANT
ELECT BLADE 4.0 EZ CLEAN MEGAD (MISCELLANEOUS) ×3
ELECT CAUTERY BLADE 6.4 (BLADE) ×2 IMPLANT
ELECT REM PT RETURN 9FT ADLT (ELECTROSURGICAL) ×3
ELECTRODE BLDE 4.0 EZ CLN MEGD (MISCELLANEOUS) IMPLANT
ELECTRODE REM PT RTRN 9FT ADLT (ELECTROSURGICAL) ×1 IMPLANT
GLOVE BIO SURGEON STRL SZ7.5 (GLOVE) ×3 IMPLANT
GLOVE BIO SURGEON STRL SZ8.5 (GLOVE) ×4 IMPLANT
GLOVE BIOGEL PI IND STRL 8 (GLOVE) ×2 IMPLANT
GLOVE BIOGEL PI IND STRL 9 (GLOVE) ×1 IMPLANT
GLOVE BIOGEL PI INDICATOR 8 (GLOVE) ×2
GLOVE BIOGEL PI INDICATOR 9 (GLOVE)
GLOVE SURG SS PI 7.5 STRL IVOR (GLOVE) ×4 IMPLANT
GOWN STRL REUS W/ TWL LRG LVL3 (GOWN DISPOSABLE) ×2 IMPLANT
GOWN STRL REUS W/ TWL XL LVL3 (GOWN DISPOSABLE) ×3 IMPLANT
GOWN STRL REUS W/TWL 2XL LVL3 (GOWN DISPOSABLE) ×2 IMPLANT
GOWN STRL REUS W/TWL LRG LVL3 (GOWN DISPOSABLE) ×3
GOWN STRL REUS W/TWL XL LVL3 (GOWN DISPOSABLE) ×3
HANDPIECE INTERPULSE COAX TIP (DISPOSABLE)
HOOD PEEL AWAY FACE SHEILD DIS (HOOD) ×6 IMPLANT
KIT BASIN OR (CUSTOM PROCEDURE TRAY) ×3 IMPLANT
KIT ROOM TURNOVER OR (KITS) ×3 IMPLANT
MANIFOLD NEPTUNE II (INSTRUMENTS) ×3 IMPLANT
NEEDLE 22X1 1/2 (OR ONLY) (NEEDLE) ×3 IMPLANT
NS IRRIG 1000ML POUR BTL (IV SOLUTION) ×3 IMPLANT
PACK TOTAL JOINT (CUSTOM PROCEDURE TRAY) ×3 IMPLANT
PACK UNIVERSAL I (CUSTOM PROCEDURE TRAY) ×3 IMPLANT
PAD ARMBOARD 7.5X6 YLW CONV (MISCELLANEOUS) ×6 IMPLANT
PASSER SUT SWANSON 36MM LOOP (INSTRUMENTS) ×3 IMPLANT
PRESSURIZER FEMORAL UNIV (MISCELLANEOUS) IMPLANT
SET HNDPC FAN SPRY TIP SCT (DISPOSABLE) IMPLANT
SUT ETHIBOND 2 V 37 (SUTURE) ×3 IMPLANT
SUT VIC AB 0 CTB1 27 (SUTURE) ×3 IMPLANT
SUT VIC AB 1 CTX 36 (SUTURE) ×3
SUT VIC AB 1 CTX36XBRD ANBCTR (SUTURE) ×1 IMPLANT
SUT VIC AB 2-0 CTB1 (SUTURE) ×3 IMPLANT
SUT VIC AB 3-0 SH 27 (SUTURE) ×3
SUT VIC AB 3-0 SH 27X BRD (SUTURE) ×1 IMPLANT
SYR CONTROL 10ML LL (SYRINGE) ×3 IMPLANT
TOWEL OR 17X24 6PK STRL BLUE (TOWEL DISPOSABLE) ×3 IMPLANT
TOWEL OR 17X26 10 PK STRL BLUE (TOWEL DISPOSABLE) ×3 IMPLANT
TOWER CARTRIDGE SMART MIX (DISPOSABLE) IMPLANT
TRAY FOLEY CATH 14FR (SET/KITS/TRAYS/PACK) IMPLANT

## 2015-05-15 NOTE — Transfer of Care (Signed)
Immediate Anesthesia Transfer of Care Note  Patient: Nicholas Hale  Procedure(s) Performed: Procedure(s): TOTAL HIP ARTHROPLASTY (Right)  Patient Location: PACU  Anesthesia Type:General  Level of Consciousness: alert  and oriented  Airway & Oxygen Therapy: Patient Spontanous Breathing and Patient connected to nasal cannula oxygen  Post-op Assessment: Report given to RN, Post -op Vital signs reviewed and stable and Patient moving all extremities  Post vital signs: Reviewed and stable  Last Vitals:  Filed Vitals:   05/15/15 1101  BP: 151/72  Pulse: 67  Temp: 36.7 C  Resp: 20    Complications: No apparent anesthesia complications

## 2015-05-15 NOTE — Interval H&P Note (Signed)
History and Physical Interval Note:  05/15/2015 12:46 PM  Nicholas Hale  has presented today for surgery, with the diagnosis of RIGHT HIP OSTEOARTHRITIS  The various methods of treatment have been discussed with the patient and family. After consideration of risks, benefits and other options for treatment, the patient has consented to  Procedure(s): TOTAL HIP ARTHROPLASTY (Right) as a surgical intervention .  The patient's history has been reviewed, patient examined, no change in status, stable for surgery.  I have reviewed the patient's chart and labs.  Questions were answered to the patient's satisfaction.     Kerin Salen

## 2015-05-15 NOTE — Op Note (Signed)
OPERATIVE REPORT    DATE OF PROCEDURE:  05/15/2015       PREOPERATIVE DIAGNOSIS:  RIGHT HIP OSTEOARTHRITIS                                                          POSTOPERATIVE DIAGNOSIS:  RIGHT HIP OSTEOARTHRITIS                                                           PROCEDURE:  R total hip arthroplasty using a 54 mm DePuy Pinnacle  Cup, [employer], 10-degree polyethylene liner index superior  and posterior, a +0 36 mm ceramic head, a [id number] SROM stem, 22FL Sleeve   SURGEON: Kellee Sittner J    ASSISTANT:   [employer]  (present throughout entire procedure and necessary for timely completion of the procedure)   ANESTHESIA: GET BLOOD LOSS: 300 FLUID REPLACEMENT: 1500 crystalloid Antibiotic: 2gm ancef Tranexamic Acid: 2gm topical COMPLICATIONS: none    INDICATIONS FOR PROCEDURE: A 69 y.o. year-old With  [place]   for 5 years, x-rays show bone-on-bone arthritic changes. Despite conservative measures with observation, anti-inflammatory medicine, narcotics, use of a cane, has severe unremitting pain and can ambulate only a few blocks before resting.  Patient desires elective R total hip arthroplasty to decrease pain and increase function. The risks, benefits, and alternatives were discussed at length including but not limited to the risks of infection, bleeding, nerve injury, stiffness, blood clots, the need for revision surgery, cardiopulmonary complications, among others, and they were willing to proceed. Questions answered     PROCEDURE IN DETAIL: The patient was identified by armband,  received preoperative IV antibiotics in the holding area at [facility], taken to the operating room , appropriate anesthetic monitors  were attached and general endotracheal anesthesia induced. Pt was rolled into the L lateral decubitus position and fixed there with a Stulberg Mark II pelvic clamp.  The R lower extremity was then prepped and draped   in the usual sterile fashion from the ankle to the hemipelvis. A time-out  procedure was performed. The skin along the lateral hip and thigh  infiltrated with 10 mL of 0.5% Marcaine and epinephrine solution. We  then made a posterolateral approach to the hip. With a #10 blade, a 14 cm  incision was made through the skin and subcutaneous tissue down to the level of the  IT band. Small bleeders were identified and cauterized. The IT band was cut in  line with skin incision exposing the greater trochanter. A Cobra retractor was placed between the gluteus minimus and the superior hip joint capsule, and a spiked Cobra between the quadratus femoris and the inferior hip joint capsule. This isolated the short  external rotators and piriformis tendons. These were tagged with a #2 Ethibond  suture and cut off their insertion on the intertrochanteric crest. The posterior  capsule was then developed into an acetabular-based flap from Posterior Superior off of the acetabulum out over the femoral neck and back posterior inferior to the acetabular rim. This flap was tagged with two #2 Ethibond sutures and retracted  protecting the sciatic nerve. This exposed the arthritic femoral head and osteophytes. The hip was then flexed and internally rotated, dislocating the femoral head and a standard neck cut performed 1 fingerbreadth above the lesser trochanter.  A spiked Cobra was placed in the cotyloid notch and a Hohmann retractor was then used to lever the femur anteriorly off of the anterior pelvic column. A posterior-inferior wing retractor was placed at the junction of the acetabulum and the ischium completing the acetabular exposure.We then removed the peripheral osteophytes and labrum from the acetabulum. We then reamed the acetabulum up to 53 mm with basket reamers obtaining good coverage in all quadrants. We then irrigated with normal  saline solution and hammered into place a 54 mm pinnacle cup in 45  degrees of  abduction and about 25 degrees of anteversion. More  peripheral osteophytes removed and a trial 10-degree liner placed with the  index superior-posterior. The hip was then flexed and internally rotated exposing the  proximal femur, which was entered with the initiating reamer followed by  the axial reamers up to a 17.5 mm full depth and 53m partial depth. We then conically reamed to 58F to the correct depth for a 42 base neck. The calcar was milled to 58FL. A trial sleeve and stem was inserted in the 20 degrees anteversion, with a +0 372mtrial head. Trial reduction was then performed and excellent stability was noted with at 90 of flexion with 75 of internal rotation and then full extension with maximal external rotation. The hip could not be dislocated in full extension. The knee could easily flex  to about 130 degrees. We also stretched the abductors at this point,  because of the preexisting adductor contractures. All trial components  were then removed. The acetabulum was irrigated out with normal saline  solution. A titanium [facility] then screwed into place  followed by a 10-degree polyethylene liner index superior-posterior. On  the femoral side a 58FL ZTT1 sleeve was hammered into place, followed by a [id number] stem in 25 degrees of anteversion. At this point, a +0 36 mm ceramic head was  hammered on the stem. The hip was reduced. We checked our stability  one more time and found it to be excellent. The wound was once again  thoroughly irrigated out with normal saline solution hand lavage. The  capsular flap and short external rotators were repaired back to the  intertrochanteric crest through drill holes with a #2 Ethibond suture.  The IT band was closed with running 1 Vicryl suture. The subcutaneous  tissue with 0 and 2-0 undyed Vicryl suture and the skin with running  3-0 vicryl subcuticular suture. Aquacil dressing was applied. The patient was then unclamped,  rolled supine, awaken extubated and taken to recovery room without difficulty in stable condition.   ROFrederik Pear 05/15/2015, 2:15 PM

## 2015-05-15 NOTE — Anesthesia Preprocedure Evaluation (Signed)
Anesthesia Evaluation  Patient identified by MRN, date of birth, ID band Patient awake    Reviewed: Allergy & Precautions, NPO status , Patient's Chart, lab work & pertinent test results  History of Anesthesia Complications Negative for: history of anesthetic complications  Airway Mallampati: I  TM Distance: >3 FB Neck ROM: Full    Dental  (+) Teeth Intact   Pulmonary neg shortness of breath, neg sleep apnea, neg COPDformer smoker, PE breath sounds clear to auscultation        Cardiovascular hypertension, - angina- Past MI and - CHF Rhythm:Regular     Neuro/Psych negative neurological ROS  negative psych ROS   GI/Hepatic negative GI ROS, Neg liver ROS,   Endo/Other  negative endocrine ROS  Renal/GU negative Renal ROS     Musculoskeletal   Abdominal   Peds  Hematology   Anesthesia Other Findings   Reproductive/Obstetrics                             Anesthesia Physical Anesthesia Plan  ASA: II  Anesthesia Plan: General   Post-op Pain Management:    Induction: Intravenous  Airway Management Planned: Oral ETT  Additional Equipment: None  Intra-op Plan:   Post-operative Plan: Extubation in OR  Informed Consent: I have reviewed the patients History and Physical, chart, labs and discussed the procedure including the risks, benefits and alternatives for the proposed anesthesia with the patient or authorized representative who has indicated his/her understanding and acceptance.   Dental advisory given  Plan Discussed with: CRNA and Surgeon  Anesthesia Plan Comments:         Anesthesia Quick Evaluation

## 2015-05-15 NOTE — Anesthesia Procedure Notes (Signed)
Procedure Name: Intubation Date/Time: 05/15/2015 1:07 PM Performed by: Melina Copa, Brysun Eschmann R Pre-anesthesia Checklist: Patient identified, Timeout performed, Emergency Drugs available, Suction available and Patient being monitored Patient Re-evaluated:Patient Re-evaluated prior to inductionOxygen Delivery Method: Circle system utilized Preoxygenation: Pre-oxygenation with 100% oxygen Intubation Type: IV induction Ventilation: Mask ventilation without difficulty Laryngoscope Size: Mac and 4 Grade View: Grade III Tube type: Oral Tube size: 8.0 mm Number of attempts: 3 Airway Equipment and Method: Stylet and Bougie stylet Placement Confirmation: ETT inserted through vocal cords under direct vision,  positive ETCO2 and breath sounds checked- equal and bilateral Secured at: 22 cm Tube secured with: Tape Dental Injury: Teeth and Oropharynx as per pre-operative assessment  Comments: DL x2 with Sabra Heck 2 (A Auston CRNA).  Can see base of arytenoids.  DL by Dr. Ermalene Postin with Mac 4; GR 3 view.  Bougie stylet inserted through cords and 8.0 ETT advanced over.

## 2015-05-16 LAB — CBC
HEMATOCRIT: 41.8 % (ref 39.0–52.0)
Hemoglobin: 13.9 g/dL (ref 13.0–17.0)
MCH: 32 pg (ref 26.0–34.0)
MCHC: 33.3 g/dL (ref 30.0–36.0)
MCV: 96.3 fL (ref 78.0–100.0)
Platelets: 146 10*3/uL — ABNORMAL LOW (ref 150–400)
RBC: 4.34 MIL/uL (ref 4.22–5.81)
RDW: 12.9 % (ref 11.5–15.5)
WBC: 7.6 10*3/uL (ref 4.0–10.5)

## 2015-05-16 MED ORDER — OXYCODONE-ACETAMINOPHEN 5-325 MG PO TABS: 1.0000 | ORAL_TABLET | ORAL | Status: DC | PRN

## 2015-05-16 MED ORDER — TAMSULOSIN HCL 0.4 MG PO CAPS
0.4000 mg | ORAL_CAPSULE | Freq: Every day | ORAL | Status: DC
  Administered 2015-05-16: 0.4 mg via ORAL
  Filled 2015-05-16 (×2): qty 1

## 2015-05-16 NOTE — Evaluation (Signed)
Physical Therapy Evaluation Patient Details Name: Nicholas Hale MRN: [id number] DOB: November 03, 1946 Today's Date: 05/16/2015   History of Present Illness  69 yo male s/p right THA, WBAT, posterior hip precautions. PMH: PE, left THA, HTN, arthritis   Clinical Impression  Patient presents with pain and post surgical deficits RLE s/p R THA. Reviewed posterior hip precautions and HEP. Tolerated ambulation with Min guard assist for safety. Pt needs to be able to negotiate 4 steps to enter home. Will continue to follow to maximize independence and mobility.     Follow Up Recommendations Home health PT;Supervision/Assistance - 24 hour    Equipment Recommendations  None recommended by PT    Recommendations for Other Services       Precautions / Restrictions Precautions Precautions: Posterior Hip;Fall Precaution Comments: Able to recall hip precautions from OT session.  Restrictions Weight Bearing Restrictions: Yes RLE Weight Bearing: Weight bearing as tolerated      Mobility  Bed Mobility Overal bed mobility: Needs Assistance Bed Mobility: Sit to Supine     Supine to sit: Min guard;HOB elevated Sit to supine: Min assist   General bed mobility comments: Sitting in chair upon PT arrival. Min A to bring RLE into bed.   Transfers Overall transfer level: Needs assistance Equipment used: Rolling walker (2 wheeled) Transfers: Sit to/from Stand Sit to Stand: Min guard         General transfer comment: Min guard for safety. Cues for hand placement/technique.   Ambulation/Gait Ambulation/Gait assistance: Min guard Ambulation Distance (Feet): 120 Feet Assistive device: Rolling walker (2 wheeled) Gait Pattern/deviations: Step-through pattern;Decreased stance time - right;Decreased step length - left;Trunk flexed   Gait velocity interpretation: Below normal speed for age/gender General Gait Details: Cues to decrease stride length and for RW safety/management.  Stairs             Wheelchair Mobility    Modified Rankin (Stroke Patients Only)       Balance Overall balance assessment: Needs assistance Sitting-balance support: Feet supported;Single extremity supported Sitting balance-Leahy Scale: Fair Sitting balance - Comments: Posterior lean and UE support secondary to discomfort in hip with sitting upright. Postural control: Posterior lean Standing balance support: During functional activity Standing balance-Leahy Scale: Poor Standing balance comment: Performed urination in standing without UE support and hand washing for short period.                             Pertinent Vitals/Pain Pain Assessment: 0-10 Pain Score: 6  Pain Location: RLE Pain Descriptors / Indicators: Sore Pain Intervention(s): Monitored during session;Repositioned;Patient requesting pain meds-RN notified    Home Living Family/patient expects to be discharged to:: Private residence Living Arrangements: Spouse/significant other Available Help at Discharge: Family;Available 24 hours/day Type of Home: House Home Access: Stairs to enter Entrance Stairs-Rails: Psychiatric nurse of Steps: 8 Home Layout: Two level;Able to live on main level with bedroom/bathroom Home Equipment: Adaptive equipment;Bedside commode;Walker - 2 wheels      Prior Function Level of Independence: Independent               Hand Dominance   Dominant Hand: Right    Extremity/Trunk Assessment   Upper Extremity Assessment: Defer to OT evaluation           Lower Extremity Assessment: RLE deficits/detail RLE Deficits / Details: Limited AROM hip secondary to surgery/pain. Able to perform LAQ.    Cervical / Trunk Assessment: Normal  Communication   Communication:  No difficulties  Cognition Arousal/Alertness: Awake/alert Behavior During Therapy: WFL for tasks assessed/performed Overall Cognitive Status: Within Functional Limits for tasks assessed                       General Comments      Exercises Total Joint Exercises Ankle Circles/Pumps: Both;10 reps;Supine Quad Sets: Right;10 reps;Seated Long Arc Quad: Right;10 reps;Seated      Assessment/Plan    PT Assessment Patient needs continued PT services  PT Diagnosis Difficulty walking;Acute pain   PT Problem List Decreased strength;Pain;Decreased range of motion;Decreased activity tolerance;Decreased balance;Decreased mobility;Decreased knowledge of precautions;Decreased knowledge of use of DME  PT Treatment Interventions Balance training;Gait training;Stair training;Functional mobility training;Therapeutic activities;Therapeutic exercise;Patient/family education;DME instruction   PT Goals (Current goals can be found in the Care Plan section) Acute Rehab PT Goals Patient Stated Goal: go home PT Goal Formulation: With patient Time For Goal Achievement: 05/30/15 Potential to Achieve Goals: Good    Frequency 7X/week   Barriers to discharge Inaccessible home environment 8 or 4 steps to enter home    Co-evaluation               End of Session Equipment Utilized During Treatment: Gait belt Activity Tolerance: Patient tolerated treatment well Patient left: in bed;with call bell/phone within reach;with bed alarm set Nurse Communication: Mobility status         Time: 1009-1036 PT Time Calculation (min) (ACUTE ONLY): 27 min   Charges:   PT Evaluation $Initial PT Evaluation Tier I: 1 Procedure PT Treatments $Gait Training: 8-22 mins   PT G Codes:        Chaley Castellanos A Rakan Soffer 05/16/2015, 11:46 AM Wray Kearns, [place], DPT [phone]

## 2015-05-16 NOTE — Discharge Instructions (Addendum)
Information on my medicine - ELIQUIS (apixaban)  This medication education was reviewed with me or my healthcare representative as part of my discharge preparation.  The pharmacist that spoke with me during my hospital stay was:  Lavenia Atlas, [facility]  Why was Eliquis prescribed for you? Eliquis was prescribed for you to reduce the risk of forming blood clots that can cause a stroke if you have a medical condition called atrial fibrillation (a type of irregular heartbeat) OR to reduce the risk of a blood clots forming after orthopedic surgery.  What do You need to know about Eliquis ? Take your Eliquis TWICE DAILY - one tablet in the morning and one tablet in the evening with or without food.  It would be best to take the doses about the same time each day.  If you have difficulty swallowing the tablet whole please discuss with your pharmacist how to take the medication safely.  Take Eliquis exactly as prescribed by your doctor and DO NOT stop taking Eliquis without talking to the doctor who prescribed the medication.  Stopping may increase your risk of developing a new clot or stroke.  Refill your prescription before you run out.  After discharge, you should have regular check-up appointments with your healthcare provider that is prescribing your Eliquis.  In the future your dose may need to be changed if your kidney function or weight changes by a significant amount or as you get older.  What do you do if you miss a dose? If you miss a dose, take it as soon as you remember on the same day and resume taking twice daily.  Do not take more than one dose of ELIQUIS at the same time.  Important Safety Information A possible side effect of Eliquis is bleeding. You should call your healthcare provider right away if you experience any of the following: ? Bleeding from an injury or your nose that does not stop. ? Unusual colored urine (red or dark brown) or unusual colored stools (red or  black). ? Unusual bruising for unknown reasons. ? A serious fall or if you hit your head (even if there is no bleeding).  Some medicines may interact with Eliquis and might increase your risk of bleeding or clotting while on Eliquis. To help avoid this, consult your healthcare provider or pharmacist prior to using any new prescription or non-prescription medications, including herbals, vitamins, non-steroidal anti-inflammatory drugs (NSAIDs) and supplements.  This website has more information on Eliquis (apixaban): [email or web address].no.  INSTRUCTIONS AFTER JOINT REPLACEMENT   o Remove items at home which could result in a fall. This includes throw rugs or furniture in walking pathways o ICE to the affected joint every three hours while awake for 30 minutes at a time, for at least the first 3-5 days, and then as needed for pain and swelling.  Continue to use ice for pain and swelling. You may notice swelling that will progress down to the foot and ankle.  This is normal after surgery.  Elevate your leg when you are not up walking on it.   o Continue to use the breathing machine you got in the hospital (incentive spirometer) which will help keep your temperature down.  It is common for your temperature to cycle up and down following surgery, especially at night when you are not up moving around and exerting yourself.  The breathing machine keeps your lungs expanded and your temperature down.   DIET:  As you were doing prior to  hospitalization, we recommend a well-balanced diet.  DRESSING / WOUND CARE / SHOWERING  change dressing prn  ACTIVITY  o Increase activity slowly as tolerated, but follow the weight bearing instructions below.   o No driving for 6 weeks or until further direction given by your physician.  You cannot drive while taking narcotics.  o No lifting or carrying greater than 10 lbs. until further directed by your surgeon. o Avoid periods of inactivity such as sitting longer than  an hour when not asleep. This helps prevent blood clots.  o You may return to work once you are authorized by your doctor.     WEIGHT BEARING   Weight bearing as tolerated with assist device (walker, cane, etc) as directed, use it as long as suggested by your surgeon or therapist, typically at least 4-6 weeks.   EXERCISES  Results after joint replacement surgery are often greatly improved when you follow the exercise, range of motion and muscle strengthening exercises prescribed by your doctor. Safety measures are also important to protect the joint from further injury. Any time any of these exercises cause you to have increased pain or swelling, decrease what you are doing until you are comfortable again and then slowly increase them. If you have problems or questions, call your caregiver or physical therapist for advice.   Rehabilitation is important following a joint replacement. After just a few days of immobilization, the muscles of the leg can become weakened and shrink (atrophy).  These exercises are designed to build up the tone and strength of the thigh and leg muscles and to improve motion. Often times heat used for twenty to thirty minutes before working out will loosen up your tissues and help with improving the range of motion but do not use heat for the first two weeks following surgery (sometimes heat can increase post-operative swelling).   These exercises can be done on a training (exercise) mat, on the floor, on a table or on a bed. Use whatever works the best and is most comfortable for you.    Use music or television while you are exercising so that the exercises are a pleasant break in your day. This will make your life better with the exercises acting as a break in your routine that you can look forward to.   Perform all exercises about fifteen times, three times per day or as directed.  You should exercise both the operative leg and the other leg as well.  Exercises  include:    Quad Sets - Tighten up the muscle on the front of the thigh (Quad) and hold for 5-10 seconds.    Straight Leg Raises - With your knee straight (if you were given a brace, keep it on), lift the leg to 60 degrees, hold for 3 seconds, and slowly lower the leg.  Perform this exercise against resistance later as your leg gets stronger.   Leg Slides: Lying on your back, slowly slide your foot toward your buttocks, bending your knee up off the floor (only go as far as is comfortable). Then slowly slide your foot back down until your leg is flat on the floor again.   Angel Wings: Lying on your back spread your legs to the side as far apart as you can without causing discomfort.   Hamstring Strength:  Lying on your back, push your heel against the floor with your leg straight by tightening up the muscles of your buttocks.  Repeat, but this time bend your  knee to a comfortable angle, and push your heel against the floor.  You may put a pillow under the heel to make it more comfortable if necessary.   A rehabilitation program following joint replacement surgery can speed recovery and prevent re-injury in the future due to weakened muscles. Contact your doctor or a physical therapist for more information on knee rehabilitation.    CONSTIPATION  Constipation is defined medically as fewer than three stools per week and severe constipation as less than one stool per week.  Even if you have a regular bowel pattern at home, your normal regimen is likely to be disrupted due to multiple reasons following surgery.  Combination of anesthesia, postoperative narcotics, change in appetite and fluid intake all can affect your bowels.   YOU MUST use at least one of the following options; they are listed in order of increasing strength to get the job done.  They are all available over the counter, and you may need to use some, POSSIBLY even all of these options:    Drink plenty of fluids (prune juice may be  helpful) and high fiber foods Colace 100 mg by mouth twice a day  Senokot for constipation as directed and as needed Dulcolax (bisacodyl), take with full glass of water  Miralax (polyethylene glycol) once or twice a day as needed.  If you have tried all these things and are unable to have a bowel movement in the first 3-4 days after surgery call either your surgeon or your primary doctor.    If you experience loose stools or diarrhea, hold the medications until you stool forms back up.  If your symptoms do not get better within 1 week or if they get worse, check with your doctor.  If you experience "the worst abdominal pain ever" or develop nausea or vomiting, please contact the office immediately for further recommendations for treatment.   ITCHING:  If you experience itching with your medications, try taking only a single pain pill, or even half a pain pill at a time.  You can also use Benadryl over the counter for itching or also to help with sleep.   TED HOSE STOCKINGS:  Use stockings on both legs until for at least 2 weeks or as directed by physician office. They may be removed at night for sleeping.  MEDICATIONS:  See your medication summary on the After Visit Summary that nursing will review with you.  You may have some home medications which will be placed on hold until you complete the course of blood thinner medication.  It is important for you to complete the blood thinner medication as prescribed.  PRECAUTIONS:  If you experience chest pain or shortness of breath - call 911 immediately for transfer to the hospital emergency department.   If you develop a fever greater that 101 F, purulent drainage from wound, increased redness or drainage from wound, foul odor from the wound/dressing, or calf pain - CONTACT YOUR SURGEON.                                                   FOLLOW-UP APPOINTMENTS:  If you do not already have a post-op appointment, please call the office for an  appointment to be seen by your surgeon.  Guidelines for how soon to be seen are listed in your After Visit Summary,  but are typically between 1-4 weeks after surgery.  OTHER INSTRUCTIONS:   Knee Replacement:  Do not place pillow under knee, focus on keeping the knee straight while resting. CPM instructions: 0-90 degrees, 2 hours in the morning, 2 hours in the afternoon, and 2 hours in the evening. Place foam block, curve side up under heel at all times except when in CPM or when walking.  DO NOT modify, tear, cut, or change the foam block in any way.  MAKE SURE YOU:   Understand these instructions.   Get help right away if you are not doing well or get worse.    Thank you for letting us be a part of your medical care team.  It is a privilege we respect greatly.  We hope these instructions will help you stay on track for a fast and full recovery!

## 2015-05-16 NOTE — Progress Notes (Signed)
Occupational Therapy Evaluation Patient Details Name: Nicholas Hale MRN: [id number] DOB: Apr 11, 1946 Today's Date: 05/16/2015    History of Present Illness 69 yo male s/p right THA, WBAT, posterior hip precautions. PMH: PE, left THA, HTN, arthritis    Clinical Impression   Patient presenting with decreased independence and weakness secondary to R THA.  Patient independent PTA. Patient currently functioning at an overall min guard for functional mobility/transfers and max assist for LB ADLs without use of AE. Patient will benefit from acute OT to increase overall independence in the areas of ADLs, functional mobility, and overall safety in order to safely discharge home with assistance from wife and no follow-up OT at this time.     Follow Up Recommendations  No OT follow up;Supervision/Assistance - 24 hour    Equipment Recommendations  None recommended by OT    Recommendations for Other Services  None at this time   Precautions / Restrictions Precautions Precautions: Posterior Hip;Fall Precaution Comments: Reviewed posterior hip precautions and handout given; patient with good carryover regarding precautions from last surgery Restrictions Weight Bearing Restrictions: Yes RLE Weight Bearing: Weight bearing as tolerated      Mobility Bed Mobility Overal bed mobility: Needs Assistance Bed Mobility: Supine to Sit     Supine to sit: Min guard;HOB elevated     General bed mobility comments: Min guard for safety.  Transfers Overall transfer level: Needs assistance Equipment used: Rolling walker (2 wheeled) Transfers: Sit to/from Stand Sit to Stand: Min assist         General transfer comment: Min assist for safety and to power up to standing. No complaints of dizziness    Balance Overall balance assessment: Needs assistance Sitting-balance support: Bilateral upper extremity supported;Feet supported Sitting balance-Leahy Scale: Poor     Standing balance support:  Bilateral upper extremity supported;During functional activity Standing balance-Leahy Scale: Poor    ADL Overall ADL's : Needs assistance/impaired Eating/Feeding: Set up;Sitting   Grooming: Min guard;Standing   Upper Body Bathing: Minimal assitance;Sitting   Lower Body Bathing: Maximal assistance;Sit to/from stand   Upper Body Dressing : Minimal assistance;Sitting   Lower Body Dressing: Maximal assistance;Sit to/from stand   Toilet Transfer: Min guard;RW;BSC;Grab bars   Toileting- Water quality scientist and Hygiene: Min guard;Sit to/from stand General ADL Comments: Patient states he has AE from his previous surgery. Plan is to practice AE next session to ensure safe use and adherence to posterior hip precautions. Patient states he uses RW in shower to steady self, unable to use BSC secondary to small shower. Also, plan to practice functional transfers for safety at home. Wife will be available to assist 24/7.      Vision Additional Comments: no change in baseline          Pertinent Vitals/Pain Pain Assessment: 0-10 Pain Score: 6  Pain Location: RLE Pain Descriptors / Indicators: Aching Pain Intervention(s): Limited activity within patient's tolerance;Monitored during session;Repositioned     Hand Dominance Right   Extremity/Trunk Assessment Upper Extremity Assessment Upper Extremity Assessment: Overall WFL for tasks assessed   Lower Extremity Assessment Lower Extremity Assessment: Defer to PT evaluation   Cervical / Trunk Assessment Cervical / Trunk Assessment: Normal   Communication Communication Communication: No difficulties   Cognition Arousal/Alertness: Awake/alert Behavior During Therapy: WFL for tasks assessed/performed Overall Cognitive Status: Within Functional Limits for tasks assessed             Home Living Family/patient expects to be discharged to:: Private residence Living Arrangements: Spouse/significant other Available Help  at Discharge:  Family;Available 24 hours/day Type of Home: House Home Access: Stairs to enter [employer] of Steps: 8 Entrance Stairs-Rails: Right;Left Home Layout: Two level;Able to live on main level with bedroom/bathroom     Bathroom Shower/Tub: Walk-in shower;Door   [employer] Toilet: Handicapped height     Home Equipment: Research scientist (life sciences);Bedside commode;Walker - 2 wheels   Prior Functioning/Environment Level of Independence: Independent     OT Diagnosis: Generalized weakness;Acute pain   OT Problem List: Decreased strength;Decreased range of motion;Decreased activity tolerance;Impaired balance (sitting and/or standing);Decreased knowledge of use of DME or AE;Pain   OT Treatment/Interventions: Self-care/ADL training;Therapeutic exercise;Energy conservation;DME and/or AE instruction;Therapeutic activities;Patient/family education;Balance training    OT Goals(Current goals can be found in the care plan section) Acute Rehab OT Goals Patient Stated Goal: go home OT Goal Formulation: With patient/family Time For Goal Achievement: 05/23/15 Potential to Achieve Goals: Good ADL Goals Pt Will Perform Grooming: with modified independence;standing Pt Will Perform Upper Body Bathing: Independently;sitting Pt Will Perform Lower Body Bathing: with modified independence;sit to/from stand;with adaptive equipment Pt Will Perform Upper Body Dressing: Independently;sitting Pt Will Perform Lower Body Dressing: with modified independence;sit to/from stand;with adaptive equipment Pt Will Transfer to Toilet: with modified independence;ambulating;bedside commode Pt Will Perform Toileting - Clothing Manipulation and hygiene: with modified independence;sit to/from stand Pt Will Perform Tub/Shower Transfer: Shower transfer;ambulating;rolling walker;with supervision  OT Frequency: Min 2X/week   Barriers to D/C: None known at this time   End of Session Equipment Utilized During Treatment: Gait  belt;Rolling walker  Activity Tolerance: Patient tolerated treatment well Patient left: in chair;with call bell/phone within reach;with family/visitor present   Time: 2919-1660 OT Time Calculation (min): 34 min Charges:  OT General Charges $OT Visit: 1 Procedure OT Evaluation $Initial OT Evaluation Tier I: 1 Procedure OT Treatments $Self Care/Home Management : 8-22 mins  Josephus Harriger , MS, OTR/L, CLT Pager: [phone]  05/16/2015, 9:54 AM

## 2015-05-16 NOTE — Progress Notes (Signed)
Physical Therapy Treatment Patient Details Name: Nicholas Hale MRN: [id number] DOB: 1946/01/16 Today's Date: 05/16/2015    History of Present Illness 69 yo male s/p right THA, WBAT, posterior hip precautions. PMH: PE, left THA, HTN, arthritis     PT Comments    Patient progressing well towards PT goals. Continues to have some difficulty with bed mobility and sit/stand transfers due to pain in right hip. Plan for stair training tomorrow to prepare pt for d/c home. Will continue to follow to maximize independence.   Follow Up Recommendations  Home health PT;Supervision/Assistance - 24 hour     Equipment Recommendations  None recommended by PT    Recommendations for Other Services       Precautions / Restrictions Precautions Precautions: Posterior Hip;Fall Precaution Booklet Issued: Yes (comment) Precaution Comments: Able to recall hip precautions from OT session.  Restrictions Weight Bearing Restrictions: Yes RLE Weight Bearing: Weight bearing as tolerated    Mobility  Bed Mobility Overal bed mobility: Needs Assistance Bed Mobility: Supine to Sit;Sit to Supine     Supine to sit: Min guard Sit to supine: Min assist;Min guard   General bed mobility comments: Performed supine to/from sit x2; initially required assist bringing RLE into bed progressing to MIn guard. Cues for technique. Reluctant to flex hips/trunk due to pain in hip.  Transfers Overall transfer level: Needs assistance Equipment used: Rolling walker (2 wheeled) Transfers: Sit to/from Stand Sit to Stand: Min guard;Min assist         General transfer comment: Min A to boost from EOB esp low surfaces. Difficulty with flexing hips/triunk to stand due to pain. Pulling up on RW despite cues to push from surface.  Ambulation/Gait Ambulation/Gait assistance: Min guard Ambulation Distance (Feet): 150 Feet (+40') Assistive device: Rolling walker (2 wheeled) Gait Pattern/deviations: Step-through  pattern;Decreased stance time - right;Decreased step length - left   Gait velocity interpretation: Below normal speed for age/gender General Gait Details: Steady gait. Cues to adhere to hip precautions during turns. Increased pain with weight bearing.   Stairs            Wheelchair Mobility    Modified Rankin (Stroke Patients Only)       Balance Overall balance assessment: Needs assistance Sitting-balance support: Feet supported;No upper extremity supported Sitting balance-Leahy Scale: Fair Sitting balance - Comments: Posterior lean and UE support secondary to discomfort in hip with sitting upright. Postural control: Posterior lean Standing balance support: During functional activity Standing balance-Leahy Scale: Fair Standing balance comment: Performed urination in standing without UE support and hand washing for short period.                    Cognition Arousal/Alertness: Awake/alert Behavior During Therapy: WFL for tasks assessed/performed Overall Cognitive Status: Within Functional Limits for tasks assessed                      Exercises Total Joint Exercises Ankle Circles/Pumps: Both;10 reps;Supine Quad Sets: Right;10 reps;Seated Long Arc Quad: Right;10 reps;Seated    General Comments        Pertinent Vitals/Pain Pain Assessment: Faces Pain Score: 6  Faces Pain Scale: Hurts whole lot Pain Location: RLE Pain Descriptors / Indicators: Sore;Aching Pain Intervention(s): Monitored during session;Repositioned;Patient requesting pain meds-RN notified    Home Living Family/patient expects to be discharged to:: Private residence Living Arrangements: Spouse/significant other Available Help at Discharge: Family;Available 24 hours/day Type of Home: House Home Access: Stairs to enter Entrance Stairs-Rails: Right;Left Home  Layout: Two level;Able to live on main level with bedroom/bathroom Home Equipment: Adaptive equipment;Bedside commode;Walker -  2 wheels      Prior Function Level of Independence: Independent          PT Goals (current goals can now be found in the care plan section) Acute Rehab PT Goals Patient Stated Goal: go home PT Goal Formulation: With patient Time For Goal Achievement: 05/30/15 Potential to Achieve Goals: Good Progress towards PT goals: Progressing toward goals    Frequency  7X/week    PT Plan Current plan remains appropriate    Co-evaluation             End of Session Equipment Utilized During Treatment: Gait belt Activity Tolerance: Patient tolerated treatment well;Patient limited by pain Patient left: in bed;with call bell/phone within reach;with bed alarm set     Time: 3710-6269 PT Time Calculation (min) (ACUTE ONLY): 30 min  Charges:  $Gait Training: 8-22 mins $Therapeutic Activity: 8-22 mins                    G Codes:      Corbin Falck A Khamil Lamica 05/16/2015, 2:05 PM  Wray Kearns, [place], DPT [phone]

## 2015-05-16 NOTE — Care Management (Signed)
Utilization review completed by Hasten Sweitzer N. Ashvik Grundman, RN BSN 

## 2015-05-16 NOTE — Care Management Note (Signed)
Case Management Note  Patient Details  Name: KAMREN HEINTZELMAN MRN: [id number] Date of Birth: May 09, 1946  Subjective/Objective:                 S/p right total hip arthroplasty   Action/Plan: Set up with [facility] for HHPT by MD office. Spoke with patient and his wife, no change in discharge plan. Patient states that he already has a walker and a 3N1 and his wife and son will be able to assist after discharge.   Expected Discharge Date:                  Expected Discharge Plan:  [place]  In-House Referral:  NA  Discharge planning Services  CM Consult  Post Acute Care Choice:  Home Health Choice offered to:  Patient  DME Arranged:    DME Agency:     HH Arranged:  PT HH Agency:  [facility]  Status of Service:  In process, will continue to follow  Medicare Important Message Given:    Date Medicare IM Given:    Medicare IM give by:    Date Additional Medicare IM Given:    Additional Medicare Important Message give by:     If discussed at [place] of Stay Meetings, dates discussed:    Additional Comments:  Nila Nephew, RN 05/16/2015, 2:12 PM

## 2015-05-16 NOTE — Progress Notes (Signed)
Patient ID: Nicholas Hale, male   DOB: 02/19/1946, 69 y.o.   MRN: [id number] PATIENT ID: Nicholas Hale  MRN: [id number]  DOB/AGE:  09/21/1946 / 69 y.o.  1 Day Post-Op Procedure(s) (LRB): TOTAL HIP ARTHROPLASTY (Right)    PROGRESS NOTE Subjective: Patient is alert, oriented, no Nausea, no Vomiting, yes passing gas, no Bowel Movement. Taking PO well. Denies SOB, Chest or Calf Pain. Using Incentive Spirometer, PAS in place. Ambulate WBAT, had to have in and out cath 1 since surgery Patient reports pain as 2 on 0-10 scale  .    Objective: Vital signs in last 24 hours: Filed Vitals:   05/15/15 1756 05/15/15 2012 05/16/15 0150 05/16/15 0538  BP: 144/71 153/69 136/56 138/55  Pulse: 65 85 75 74  Temp: 98.1 F (36.7 C) 98.4 F (36.9 C) 99.3 F (37.4 C) 99.9 F (37.7 C)  TempSrc: Oral     Resp: '20 18 18 18  '$ Weight:      SpO2: 100% 92% 93% 97%      Intake/Output from previous day: I/O last 3 completed shifts: In: 2656.3 [I.V.:2656.3] Out: 1050 [Urine:650; Blood:400]   Intake/Output this shift:     LABORATORY DATA: No results for input(s): WBC, HGB, HCT, PLT, NA, K, CL, CO2, BUN, CREATININE, GLUCOSE, GLUCAP, INR, CALCIUM in the last 72 hours.  Invalid input(s): PT, 2  Examination: Neurologically intact ABD soft Neurovascular intact Sensation intact distally Intact pulses distally Dorsiflexion/Plantar flexion intact Incision: dressing C/D/I No cellulitis present Compartment soft} XR AP&Lat of hip shows well placed\fixed THA  Assessment:   1 Day Post-Op Procedure(s) (LRB): TOTAL HIP ARTHROPLASTY (Right) ADDITIONAL DIAGNOSIS:  Expected Acute Blood Loss Anemia, HTN, history of bilateral PE last year  Plan: PT/OT WBAT, THA  posterior precautions  DVT Prophylaxis: SCDx72 hrs, eliguist 5 mg by mouth twice a day indefinitely  DISCHARGE PLAN: Home  DISCHARGE NEEDS: HHPT, Walker and 3-in-1 comode seat

## 2015-05-17 ENCOUNTER — Encounter (HOSPITAL_COMMUNITY): Payer: Self-pay | Admitting: Orthopedic Surgery

## 2015-05-17 LAB — CBC
HCT: 40.4 % (ref 39.0–52.0)
Hemoglobin: 13.4 g/dL (ref 13.0–17.0)
MCH: 32.2 pg (ref 26.0–34.0)
MCHC: 33.2 g/dL (ref 30.0–36.0)
MCV: 97.1 fL (ref 78.0–100.0)
PLATELETS: 141 10*3/uL — AB (ref 150–400)
RBC: 4.16 MIL/uL — ABNORMAL LOW (ref 4.22–5.81)
RDW: 13 % (ref 11.5–15.5)
WBC: 15.4 10*3/uL — AB (ref 4.0–10.5)

## 2015-05-17 NOTE — Progress Notes (Signed)
Physical Therapy Treatment Patient Details Name: Nicholas Hale MRN: [id number] DOB: 1946-07-26 Today's Date: 05/17/2015    History of Present Illness 69 yo male s/p right THA, WBAT, posterior hip precautions. PMH: PE, left THA, HTN, arthritis     PT Comments    Patient progressing well. Able to complete stair training this morning. Patient safe to D/C from a mobility standpoint based on progression towards goals set on PT eval.    Follow Up Recommendations  Home health PT;Supervision/Assistance - 24 hour     Equipment Recommendations  None recommended by PT    Recommendations for Other Services       Precautions / Restrictions Precautions Precautions: Posterior Hip;Fall Precaution Comments: Able to recall hip precautions  Restrictions RLE Weight Bearing: Weight bearing as tolerated    Mobility  Bed Mobility Overal bed mobility: Modified Independent             General bed mobility comments: With blue leg lifter  Transfers Overall transfer level: Modified independent                  Ambulation/Gait Ambulation/Gait assistance: Supervision Ambulation Distance (Feet): 600 Feet Assistive device: Rolling walker (2 wheeled) Gait Pattern/deviations: Step-through pattern;Decreased stride length     General Gait Details: Steady step through pattern   Stairs Stairs: Yes Stairs assistance: Min guard Stair Management: Step to pattern;Sideways;Forwards;Two rails Number of Stairs: 8 General stair comments: Patient able to recall safe technique. [place]inguard for safety  Wheelchair Mobility    Modified Rankin (Stroke Patients Only)       Balance                                    Cognition Arousal/Alertness: Awake/alert Behavior During Therapy: WFL for tasks assessed/performed Overall Cognitive Status: Within Functional Limits for tasks assessed                      Exercises Total Joint Exercises Quad Sets: Right;10  reps;AROM Gluteal Sets: AROM;Both;10 reps Heel Slides: AROM;Right;10 reps Hip ABduction/ADduction: AROM;Right;10 reps Long Arc Quad: AROM;Right;10 reps    General Comments        Pertinent Vitals/Pain Pain Score: 8  Pain Location: RLE at its worse. When being still stated no pain Pain Descriptors / Indicators: Sore Pain Intervention(s): Monitored during session;Repositioned    Home Living                      Prior Function            PT Goals (current goals can now be found in the care plan section) Progress towards PT goals: Progressing toward goals    Frequency  7X/week    PT Plan Current plan remains appropriate    Co-evaluation             End of Session   Activity Tolerance: Patient tolerated treatment well Patient left: in chair     Time: 7048-8891 PT Time Calculation (min) (ACUTE ONLY): 23 min  Charges:  $Gait Training: 8-22 mins $Therapeutic Exercise: 8-22 mins                    G Codes:      Jacqualyn Posey 05/17/2015, 9:01 AM  05/17/2015 Jacqualyn Posey PTA [phone] pager [phone] office

## 2015-05-17 NOTE — Anesthesia Postprocedure Evaluation (Signed)
  Anesthesia Post-op Note  Patient: Nicholas Hale  Procedure(s) Performed: Procedure(s): TOTAL HIP ARTHROPLASTY (Right)  Patient Location: PACU  Anesthesia Type:General  Level of Consciousness: awake  Airway and Oxygen Therapy: Patient Spontanous Breathing  Post-op Pain: mild  Post-op Assessment: Post-op Vital signs reviewed, Patient's Cardiovascular Status Stable, Respiratory Function Stable, Patent Airway and Pain level controlled     RLE Motor Response: Responds to commands, Purposeful movement RLE Sensation: Full sensation, Pain      Post-op Vital Signs: Reviewed and stable  Last Vitals:  Filed Vitals:   05/17/15 0522  BP: 140/62  Pulse: 84  Temp: 36.9 C  Resp: 16    Complications: No apparent anesthesia complications

## 2015-05-17 NOTE — Discharge Summary (Signed)
Patient ID: Nicholas Hale MRN: [id number] DOB/AGE: [birth year] 69 y.o.  Admit date: 05/15/2015 Discharge date: 05/17/2015  Admission Diagnoses:  Principal Problem:   Primary osteoarthritis of right hip   Discharge Diagnoses:  Same  Past Medical History  Diagnosis Date  . Hypertension   . Hyperlipidemia   . Abdominal aortic aneurysm   . Arthritis     "hips; lower spine" (05/24/2014)  . Personal history of DVT (deep vein thrombosis) 9/15  . PE (pulmonary embolism) 9/15    Surgeries: Procedure(s): TOTAL HIP ARTHROPLASTY on 05/15/2015   Consultants:    Discharged Condition: Improved  Hospital Course: Nicholas Hale is an 69 y.o. male who was admitted 05/15/2015 for operative treatment ofPrimary osteoarthritis of right hip. Patient has severe unremitting pain that affects sleep, daily activities, and work/hobbies. After pre-op clearance the patient was taken to the operating room on 05/15/2015 and underwent  Procedure(s): TOTAL HIP ARTHROPLASTY.    Patient was given perioperative antibiotics: Anti-infectives    Start     Dose/Rate Route Frequency Ordered Stop   05/15/15 1126  ceFAZolin (ANCEF) 2-3 GM-% IVPB SOLR    Comments:  Sammuel Cooper   : cabinet override      05/15/15 1126 05/15/15 1315   05/15/15 1117  ceFAZolin (ANCEF) IVPB 2 g/50 mL premix  Status:  Discontinued     2 g 100 mL/hr over 30 Minutes Intravenous On call to O.R. 05/15/15 1117 05/15/15 1744       Patient was given sequential compression devices, early ambulation, and chemoprophylaxis to prevent DVT.  Patient benefited maximally from hospital stay and there were no complications.    Recent vital signs: Patient Vitals for the past 24 hrs:  BP Temp Pulse Resp SpO2  05/17/15 0522 140/62 mmHg 98.4 F (36.9 C) 84 16 93 %  05/16/15 2019 127/64 mmHg 99.7 F (37.6 C) 89 16 95 %  05/16/15 1400 128/66 mmHg 99.3 F (37.4 C) 81 18 96 %     Recent laboratory studies:  Recent Labs  05/16/15 0619  05/17/15 0353  WBC 7.6 15.4*  HGB 13.9 13.4  HCT 41.8 40.4  PLT 146* 141*     Discharge Medications:     Medication List    STOP taking these medications        pravastatin 40 MG tablet  Commonly known as:  PRAVACHOL      TAKE these medications        cyclobenzaprine 5 MG tablet  Commonly known as:  FLEXERIL  Take 5 mg by mouth daily as needed for muscle spasms.     ELIQUIS 5 MG Tabs tablet  Generic drug:  apixaban  TAKE ONE TABLET BY MOUTH TWICE DAILY     oxyCODONE-acetaminophen 5-325 MG per tablet  Commonly known as:  ROXICET  Take 1 tablet by mouth every 4 (four) hours as needed for severe pain.     traMADol 50 MG tablet  Commonly known as:  ULTRAM  Take 50 mg by mouth every 6 (six) hours as needed.        Diagnostic Studies: Dg Chest 2 View  05/06/2015   CLINICAL DATA:  Preoperative examination ; previous episode of pulmonary embolism.  EXAM: CHEST  2 VIEW  COMPARISON:  CT scan of the chest of August 08, 2014 and portable chest x-ray of August 05, 2014.  FINDINGS: The lungs are mildly hyperinflated and clear. The heart and pulmonary vascularity are normal. The mediastinum is normal in width. There is no  pleural effusion. The bony thorax exhibits no acute abnormality.  IMPRESSION: COPD.  There is no active cardiopulmonary disease.   Electronically Signed   By: David  Martinique M.D.   On: 05/06/2015 09:57   Dg Pelvis Portable  05/15/2015   CLINICAL DATA:  Status post right hip arthroplasty  EXAM: PORTABLE PELVIS 1-2 VIEWS  COMPARISON:  None.  FINDINGS: Bilateral total hip replacements are noted. There is noted in the surgical bed on the right. No acute bony abnormality is seen. No gross soft tissue abnormality is noted.  IMPRESSION: Status post hip replacement on the right.   Electronically Signed   By: Inez Catalina M.D.   On: 05/15/2015 15:43    Disposition: 06-Home-Health Care Svc      Discharge Instructions    Call MD / Call 911    Complete by:  As directed    If you experience chest pain or shortness of breath, CALL 911 and be transported to the hospital emergency room.  If you develope a fever above 101 F, pus (white drainage) or increased drainage or redness at the wound, or calf pain, call your surgeon's office.     Constipation Prevention    Complete by:  As directed   Drink plenty of fluids.  Prune juice may be helpful.  You may use a stool softener, such as Colace (over the counter) 100 mg twice a day.  Use MiraLax (over the counter) for constipation as needed.     Diet - low sodium heart healthy    Complete by:  As directed      Follow the hip precautions as taught in Physical Therapy    Complete by:  As directed      Increase activity slowly as tolerated    Complete by:  As directed            Follow-up Information    Follow up with [facility].   Why:  They will contact you to schedule home therapy visits.   Contact information:   [address] [place] [address] [phone]       Follow up with Kerin Salen, MD In 1 week.   Specialty:  Orthopedic Surgery   Contact information:   [place] [address] [phone]        Signed: Kerin Salen 05/17/2015, 9:03 AM

## 2015-05-17 NOTE — Progress Notes (Signed)
Nicholas Hale discharged home per MD order. Discharge instructions reviewed and discussed with patient. All questions and concerns answered. Copy of instructions and scripts given to patient. IV removed.  Patient escorted to car by staff in a wheelchair. No distress noted upon discharge.   Esaw Dace 05/17/2015 10:37 AM

## 2015-05-17 NOTE — Plan of Care (Signed)
Problem: Consults Goal: Diagnosis- Total Joint Replacement Primary Total Hip Right     

## 2015-05-17 NOTE — Progress Notes (Signed)
Patient ID: Nicholas Hale, male   DOB: 10-04-1946, 69 y.o.   MRN: [id number] PATIENT ID: Nicholas Hale  MRN: [id number]  DOB/AGE:  Sep 16, 1946 / 69 y.o.  2 Days Post-Op Procedure(s) (LRB): TOTAL HIP ARTHROPLASTY (Right)    PROGRESS NOTE Subjective: Patient is alert, oriented, no Nausea, no Vomiting, yes passing gas, no Bowel Movement. Taking PO well. Denies SOB, Chest or Calf Pain. Using Incentive Spirometer, PAS in place. Ambulate 24f Patient reports pain as 2 on 0-10 scale  .    Objective: Vital signs in last 24 hours: Filed Vitals:   05/16/15 0538 05/16/15 1400 05/16/15 2019 05/17/15 0522  BP: 138/55 128/66 127/64 140/62  Pulse: 74 81 89 84  Temp: 99.9 F (37.7 C) 99.3 F (37.4 C) 99.7 F (37.6 C) 98.4 F (36.9 C)  TempSrc:      Resp: '18 18 16 16  '$ Weight:      SpO2: 97% 96% 95% 93%      Intake/Output from previous day: I/O last 3 completed shifts: In: 1806.3 [P.O.:600; I.V.:1206.3] Out: 850 [Urine:850]   Intake/Output this shift: Total I/O In: 240 [P.O.:240] Out: -    LABORATORY DATA:  Recent Labs  05/16/15 0619 05/17/15 0353  WBC 7.6 15.4*  HGB 13.9 13.4  HCT 41.8 40.4  PLT 146* 141*    Examination: Neurologically intact ABD soft Neurovascular intact Sensation intact distally Intact pulses distally Dorsiflexion/Plantar flexion intact Incision: dressing C/D/I No cellulitis present Compartment soft} XR AP&Lat of hip shows well placed\fixed THA  Assessment:   2 Days Post-Op Procedure(s) (LRB): TOTAL HIP ARTHROPLASTY (Right) ADDITIONAL DIAGNOSIS:  Expected Acute Blood Loss Anemia, hx PE's, HTN  Plan: PT/OT WBAT, THA  posterior precautions  DVT Prophylaxis: SCDx72 hrs, ASA 325 mg BID x 2 weeks  DISCHARGE PLAN: Home, today  DISCHARGE NEEDS: HHPT, Walker and 3-in-1 comode seat

## 2015-05-18 DIAGNOSIS — Z86711 Personal history of pulmonary embolism: Secondary | ICD-10-CM | POA: Diagnosis not present

## 2015-05-18 DIAGNOSIS — Z96643 Presence of artificial hip joint, bilateral: Secondary | ICD-10-CM | POA: Diagnosis not present

## 2015-05-18 DIAGNOSIS — Z86718 Personal history of other venous thrombosis and embolism: Secondary | ICD-10-CM | POA: Diagnosis not present

## 2015-05-18 DIAGNOSIS — M1990 Unspecified osteoarthritis, unspecified site: Secondary | ICD-10-CM | POA: Diagnosis not present

## 2015-05-18 DIAGNOSIS — I10 Essential (primary) hypertension: Secondary | ICD-10-CM | POA: Diagnosis not present

## 2015-05-18 DIAGNOSIS — Z471 Aftercare following joint replacement surgery: Secondary | ICD-10-CM | POA: Diagnosis not present

## 2015-05-21 DIAGNOSIS — M1990 Unspecified osteoarthritis, unspecified site: Secondary | ICD-10-CM | POA: Diagnosis not present

## 2015-05-21 DIAGNOSIS — Z471 Aftercare following joint replacement surgery: Secondary | ICD-10-CM | POA: Diagnosis not present

## 2015-05-21 DIAGNOSIS — Z96643 Presence of artificial hip joint, bilateral: Secondary | ICD-10-CM | POA: Diagnosis not present

## 2015-05-21 DIAGNOSIS — Z86711 Personal history of pulmonary embolism: Secondary | ICD-10-CM | POA: Diagnosis not present

## 2015-05-21 DIAGNOSIS — I10 Essential (primary) hypertension: Secondary | ICD-10-CM | POA: Diagnosis not present

## 2015-05-21 DIAGNOSIS — Z86718 Personal history of other venous thrombosis and embolism: Secondary | ICD-10-CM | POA: Diagnosis not present

## 2015-05-22 DIAGNOSIS — M1990 Unspecified osteoarthritis, unspecified site: Secondary | ICD-10-CM | POA: Diagnosis not present

## 2015-05-22 DIAGNOSIS — Z86718 Personal history of other venous thrombosis and embolism: Secondary | ICD-10-CM | POA: Diagnosis not present

## 2015-05-22 DIAGNOSIS — Z86711 Personal history of pulmonary embolism: Secondary | ICD-10-CM | POA: Diagnosis not present

## 2015-05-22 DIAGNOSIS — I10 Essential (primary) hypertension: Secondary | ICD-10-CM | POA: Diagnosis not present

## 2015-05-22 DIAGNOSIS — Z471 Aftercare following joint replacement surgery: Secondary | ICD-10-CM | POA: Diagnosis not present

## 2015-05-22 DIAGNOSIS — Z96643 Presence of artificial hip joint, bilateral: Secondary | ICD-10-CM | POA: Diagnosis not present

## 2015-05-24 DIAGNOSIS — M1990 Unspecified osteoarthritis, unspecified site: Secondary | ICD-10-CM | POA: Diagnosis not present

## 2015-05-24 DIAGNOSIS — Z471 Aftercare following joint replacement surgery: Secondary | ICD-10-CM | POA: Diagnosis not present

## 2015-05-24 DIAGNOSIS — Z86711 Personal history of pulmonary embolism: Secondary | ICD-10-CM | POA: Diagnosis not present

## 2015-05-24 DIAGNOSIS — Z86718 Personal history of other venous thrombosis and embolism: Secondary | ICD-10-CM | POA: Diagnosis not present

## 2015-05-24 DIAGNOSIS — I10 Essential (primary) hypertension: Secondary | ICD-10-CM | POA: Diagnosis not present

## 2015-05-24 DIAGNOSIS — Z96643 Presence of artificial hip joint, bilateral: Secondary | ICD-10-CM | POA: Diagnosis not present

## 2015-05-27 DIAGNOSIS — M1990 Unspecified osteoarthritis, unspecified site: Secondary | ICD-10-CM | POA: Diagnosis not present

## 2015-05-27 DIAGNOSIS — Z96643 Presence of artificial hip joint, bilateral: Secondary | ICD-10-CM | POA: Diagnosis not present

## 2015-05-27 DIAGNOSIS — Z86711 Personal history of pulmonary embolism: Secondary | ICD-10-CM | POA: Diagnosis not present

## 2015-05-27 DIAGNOSIS — Z86718 Personal history of other venous thrombosis and embolism: Secondary | ICD-10-CM | POA: Diagnosis not present

## 2015-05-27 DIAGNOSIS — I10 Essential (primary) hypertension: Secondary | ICD-10-CM | POA: Diagnosis not present

## 2015-05-27 DIAGNOSIS — Z471 Aftercare following joint replacement surgery: Secondary | ICD-10-CM | POA: Diagnosis not present

## 2015-05-28 DIAGNOSIS — Z96641 Presence of right artificial hip joint: Secondary | ICD-10-CM | POA: Diagnosis not present

## 2015-05-28 DIAGNOSIS — Z9889 Other specified postprocedural states: Secondary | ICD-10-CM | POA: Diagnosis not present

## 2015-05-29 DIAGNOSIS — Z96643 Presence of artificial hip joint, bilateral: Secondary | ICD-10-CM | POA: Diagnosis not present

## 2015-05-29 DIAGNOSIS — Z86711 Personal history of pulmonary embolism: Secondary | ICD-10-CM | POA: Diagnosis not present

## 2015-05-29 DIAGNOSIS — Z86718 Personal history of other venous thrombosis and embolism: Secondary | ICD-10-CM | POA: Diagnosis not present

## 2015-05-29 DIAGNOSIS — M1990 Unspecified osteoarthritis, unspecified site: Secondary | ICD-10-CM | POA: Diagnosis not present

## 2015-05-29 DIAGNOSIS — Z471 Aftercare following joint replacement surgery: Secondary | ICD-10-CM | POA: Diagnosis not present

## 2015-05-29 DIAGNOSIS — I10 Essential (primary) hypertension: Secondary | ICD-10-CM | POA: Diagnosis not present

## 2015-05-31 DIAGNOSIS — I10 Essential (primary) hypertension: Secondary | ICD-10-CM | POA: Diagnosis not present

## 2015-05-31 DIAGNOSIS — M1990 Unspecified osteoarthritis, unspecified site: Secondary | ICD-10-CM | POA: Diagnosis not present

## 2015-05-31 DIAGNOSIS — Z86711 Personal history of pulmonary embolism: Secondary | ICD-10-CM | POA: Diagnosis not present

## 2015-05-31 DIAGNOSIS — Z471 Aftercare following joint replacement surgery: Secondary | ICD-10-CM | POA: Diagnosis not present

## 2015-05-31 DIAGNOSIS — Z86718 Personal history of other venous thrombosis and embolism: Secondary | ICD-10-CM | POA: Diagnosis not present

## 2015-05-31 DIAGNOSIS — Z96643 Presence of artificial hip joint, bilateral: Secondary | ICD-10-CM | POA: Diagnosis not present

## 2015-06-26 ENCOUNTER — Ambulatory Visit: Payer: Medicare Other | Admitting: Emergency Medicine

## 2015-07-30 ENCOUNTER — Encounter: Payer: Self-pay | Admitting: Emergency Medicine

## 2015-07-30 ENCOUNTER — Ambulatory Visit (INDEPENDENT_AMBULATORY_CARE_PROVIDER_SITE_OTHER): Payer: Medicare Other | Admitting: Emergency Medicine

## 2015-07-30 VITALS — BP 152/90 | HR 87 | Ht 70.0 in | Wt 194.0 lb

## 2015-07-30 DIAGNOSIS — I2699 Other pulmonary embolism without acute cor pulmonale: Secondary | ICD-10-CM | POA: Diagnosis not present

## 2015-07-30 NOTE — Assessment & Plan Note (Signed)
Now completed one year of Eliquis. We will stop his anticoagulation now and follow him expectantly. There is no firm data to support Eliquis 2.'5mg'$  bid in this setting.

## 2015-07-30 NOTE — Patient Instructions (Signed)
You may stop your Eliquis now Work on stopping your smoking completely Follow with Dr Lamonte Sakai in 6 months or sooner if you have any problems

## 2015-07-30 NOTE — Progress Notes (Signed)
HPI:  69 yo former smoker with a hx HTN, AAA, arthritis. He was admitted 9/6 -9/9 for massive PE with associated R heart strain and PAH. He underwent catheter directed lysis in IR with improvement in his clot burden and PAP. He was discharged on eliquis. He had an elevated anticardiolipin IgM, phosphatidylserine IgM, and positive lupus anticoagulant on 08/05/14, but this was when he had acute clot and was on anticoagulation. He is to follow with Dr Alen Blew about the hypercoag issue. He is doing well. No CP or dyspnea.   ROV 03/08/15 -- follow up for hx large PE with R heart strain and lytics. He has ben on Eliquis and has tolerated. Dr Alen Blew repeated his hypercoag panel and he did not have lupus anti-coagulant. He needs 12 months of anticoag and then can stop.  He needs to have a R hip sgy and wants me to review with Dr Mayer Camel. I believe it may be better for him to get this done while he is still going to be anticoagulated afterwards    ROV  07/30/15 --  Follow up visit for hx of large PE. He is now s/p hip surgery which went well. No breathing issues. He has been stable, has been on Eliquis x 1 year.     Past Medical History  Diagnosis Date  . Hypertension   . Hyperlipidemia   . Abdominal aortic aneurysm   . Arthritis     "hips; lower spine" (05/24/2014)  . Personal history of DVT (deep vein thrombosis) 9/15  . PE (pulmonary embolism) 9/15     Family History  Problem Relation Age of Onset  . Clotting disorder Mother   . Diabetes Mellitus I Mother   . Prostate cancer Father      Social History   Social History  . Marital Status: Married    Spouse Name: N/A  . Number of Children: N/A  . Years of Education: N/A   Occupational History  . Not on file.   Social History Main Topics  . Smoking status: Former Smoker -- 0.50 packs/day for 40 years    Types: Cigarettes    Quit date: 05/05/2009  . Smokeless tobacco: Never Used  . Alcohol Use: No  . Drug Use: No  . Sexual Activity: No    Other Topics Concern  . Not on file   Social History Narrative     No Known Allergies   Outpatient Prescriptions Prior to Visit  Medication Sig Dispense Refill  . cyclobenzaprine (FLEXERIL) 5 MG tablet Take 5 mg by mouth daily as needed for muscle spasms.    Marland Kitchen ELIQUIS 5 MG TABS tablet TAKE ONE TABLET BY MOUTH TWICE DAILY 60 tablet 5  . oxyCODONE-acetaminophen (ROXICET) 5-325 MG per tablet Take 1 tablet by mouth every 4 (four) hours as needed for severe pain. 60 tablet 0  . traMADol (ULTRAM) 50 MG tablet Take 50 mg by mouth every 6 (six) hours as needed.     No facility-administered medications prior to visit.   Filed Vitals:   07/30/15 1327  BP: 152/90  Pulse: 87  Height: '5\' 10"'$  (1.778 m)  Weight: 87.998 kg (194 lb)  SpO2: 98%   Gen: Pleasant, well-nourished, in no distress,  normal affect  ENT: No lesions,  mouth clear,  oropharynx clear, no postnasal drip  Neck: No JVD, no TMG, no carotid bruits  Lungs: No use of accessory muscles, clear without rales or rhonchi  Cardiovascular: RRR, heart sounds normal, no murmur or gallops,  no peripheral edema  Musculoskeletal: No deformities, no cyanosis or clubbing  Neuro: alert, non focal  Skin: Warm, no lesions or rashes   Pulmonary embolism Now completed one year of Eliquis. We will stop his anticoagulation now and follow him expectantly. There is no firm data to support Eliquis 2.'5mg'$  bid in this setting.

## 2015-08-06 ENCOUNTER — Ambulatory Visit (HOSPITAL_BASED_OUTPATIENT_CLINIC_OR_DEPARTMENT_OTHER): Payer: Medicare Other | Admitting: Oncology

## 2015-08-06 VITALS — BP 146/68 | HR 66 | Temp 98.1°F | Resp 18 | Ht 70.0 in | Wt 189.6 lb

## 2015-08-06 DIAGNOSIS — Z86718 Personal history of other venous thrombosis and embolism: Secondary | ICD-10-CM | POA: Diagnosis not present

## 2015-08-06 DIAGNOSIS — Z86711 Personal history of pulmonary embolism: Secondary | ICD-10-CM

## 2015-08-06 DIAGNOSIS — I2699 Other pulmonary embolism without acute cor pulmonale: Secondary | ICD-10-CM

## 2015-08-06 NOTE — Progress Notes (Signed)
Hematology and Oncology Follow Up Visit  Nicholas Hale [id number] 04-21-1946 69 y.o. 08/06/2015 9:30 AM   Principle Diagnosis: 69 year old gentleman diagnosed with bilateral DVT and bilateral PE in September 2015. This believed to be provoked by hip replacement operation in June 2015. He did have a positive lupus anticoagulant which resolved with repeat testing.   Prior Therapy: He is status post catheter directed lysis of his blood clot and subsequently chronic anticoagulation with Eliquis.   Current therapy: Full dose anticoagulation with Eliquis. This was discontinued in August 2016.  Interim History:  Nicholas Hale presents today for a follow-up visit. Since the last visit, he underwent a right hip replacement on 05/15/2015 without any complications. Postoperatively, he was continued on Eliquis till it was discontinued last week. He is recovering well from his operation and able to ambulate without any difficulty. He still have some thigh pain bilaterally.   He has not reported any bleeding or clotting episodes.  He does not report any chest pain, shortness of breath or difficulty breathing. He does not report any bleeding complication while on Eliquis.    He did not report any headaches or blurry vision or syncope. He does not report any chest pain, shortness of breath, leg edema. He does not report any cough, wheezing or difficulty breathing. He did not report any nausea, vomiting, hematochezia, melena. His laboratory frequency, urgency, hesitancy. He is not report any lymphadenopathy or petechiae. He does not report any skeletal complaints. Rest of his review of systems unremarkable.  Medications: I have reviewed the patient's current medications.  Current Outpatient Prescriptions  Medication Sig Dispense Refill  . cyclobenzaprine (FLEXERIL) 5 MG tablet Take 5 mg by mouth daily as needed for muscle spasms.    Marland Kitchen oxyCODONE-acetaminophen (ROXICET) 5-325 MG per tablet Take 1 tablet by mouth  every 4 (four) hours as needed for severe pain. 60 tablet 0  . pravastatin (PRAVACHOL) 20 MG tablet Take 20 mg by mouth daily.    . traMADol (ULTRAM) 50 MG tablet Take 50 mg by mouth every 6 (six) hours as needed.     No current facility-administered medications for this visit.     Allergies: No Known Allergies  Past Medical History, Surgical history, Social history, and Family History were reviewed and updated.   Physical Exam: Blood pressure 146/68, pulse 66, temperature 98.1 F (36.7 C), temperature source Oral, resp. rate 18, height '5\' 10"'$  (1.778 m), weight 189 lb 9.6 oz (86.002 kg), SpO2 100 %. ECOG: 0 General appearance: alert and cooperative appeared in no active distress. Head: Normocephalic, without obvious abnormality Neck: no adenopathy Lymph nodes: Cervical, supraclavicular, and axillary nodes normal. Heart:regular rate and rhythm, S1, S2 normal, no murmur, click, rub or gallop Lung:chest clear, no wheezing, rales, normal symmetric air entry Abdomin: soft, non-tender, without masses or organomegaly EXT:no erythema, induration, or nodules   Lab Results: Lab Results  Component Value Date   WBC 15.4* 05/17/2015   HGB 13.4 05/17/2015   HCT 40.4 05/17/2015   MCV 97.1 05/17/2015   PLT 141* 05/17/2015     Chemistry      Component Value Date/Time   NA 141 05/06/2015 0936   K 4.0 05/06/2015 0936   CL 112* 05/06/2015 0936   CO2 23 05/06/2015 0936   BUN 9 05/06/2015 0936   CREATININE 1.00 05/06/2015 0936      Component Value Date/Time   CALCIUM 8.8* 05/06/2015 0936       Results for Nicholas Hale (MRN [id number]) as  of 12/25/2014 10:20  Ref. Range 08/28/2014 14:19  PTT Lupus Anticoagulant Latest Range: 28.0-43.0 secs 39.9  PTTLA Confirmation Latest Range: <8.0 secs NOT APPL  PTTLA 4:1 Mix Latest Range: 28.0-43.0 secs NOT APPL  DRVVT Latest Range: <42.9 secs 40.8  Drvvt confirmation Latest Range: <1.15 Ratio NOT APPL  Lupus Anticoagulant Latest Range: NOT  DETECTED  NOT DETECTED  DRVVT 1:1 Mix Latest Range: <42.9 secs NOT APPL      Impression and Plan:   69 year old gentleman with the following issues:  1. Bilateral DVT that resulted in a bilateral PE with a large clot burden as well as significant right heart strain. He was treated with catheter directed lysis and subsequently on oral anticoagulant in the form of a Eliquis. He has tolerated it well and blood clots and resolved.  His hypercoagulable panel did not reveal any inherited thrombophilia. He did have a positive lupus anticoagulant at one point but repeat testing on 08/28/2014 did not show any evidence of that. It is very possible that could've been a transient findings or a false positive.  He is off anticoagulation completely at this time and I certainly see no indication to restart it. If he develops any idiopathic thrombosis in the future he will likely need long-term anticoagulation.  2. Hip replacement surgery. He did well postoperatively without any bleeding or thrombosis. No further anticoagulation as needed. I recommended that low-dose aspirin at 81 mg for cardiovascular protective purposes.  3. Follow-up: Will be as needed.   [facility], MD 9/6/20169:30 AM

## 2016-01-20 DIAGNOSIS — Z0000 Encounter for general adult medical examination without abnormal findings: Secondary | ICD-10-CM | POA: Diagnosis not present

## 2016-01-20 DIAGNOSIS — E785 Hyperlipidemia, unspecified: Secondary | ICD-10-CM | POA: Diagnosis not present

## 2016-01-20 DIAGNOSIS — Q6100 Congenital renal cyst, unspecified: Secondary | ICD-10-CM | POA: Diagnosis not present

## 2016-01-20 DIAGNOSIS — I714 Abdominal aortic aneurysm, without rupture: Secondary | ICD-10-CM | POA: Diagnosis not present

## 2016-01-28 ENCOUNTER — Encounter: Payer: Self-pay | Admitting: Emergency Medicine

## 2016-01-28 ENCOUNTER — Ambulatory Visit (INDEPENDENT_AMBULATORY_CARE_PROVIDER_SITE_OTHER): Payer: Medicare Other | Admitting: Emergency Medicine

## 2016-01-28 VITALS — BP 114/78 | HR 70 | Ht 71.0 in | Wt 203.0 lb

## 2016-01-28 DIAGNOSIS — Z87891 Personal history of nicotine dependence: Secondary | ICD-10-CM | POA: Diagnosis not present

## 2016-01-28 DIAGNOSIS — I2699 Other pulmonary embolism without acute cor pulmonale: Secondary | ICD-10-CM

## 2016-01-28 NOTE — Patient Instructions (Signed)
You are doing well off of anti-coagulation.  We will not do any breathing testing at this time - we will reconsider in the future if you develop symptoms.  Follow with Dr Lamonte Sakai as needed for any changes in your symptoms.

## 2016-01-28 NOTE — Assessment & Plan Note (Signed)
No evidence at this time for dyspnea or obstructive lung disease. We will defer pulmonary function testing until he develops symptoms. He will come back to see me if he develops shortness of breath, wheezing, cough, sputum

## 2016-01-28 NOTE — Progress Notes (Signed)
HPI:  70 yo former smoker with a hx HTN, AAA, arthritis. He was admitted 9/6 -9/9 for massive PE with associated R heart strain and PAH. He underwent catheter directed lysis in IR with improvement in his clot burden and PAP. He was discharged on eliquis. He had an elevated anticardiolipin IgM, phosphatidylserine IgM, and positive lupus anticoagulant on 08/05/14, but this was when he had acute clot and was on anticoagulation. He is to follow with Dr Alen Blew about the hypercoag issue. He is doing well. No CP or dyspnea.   ROV 03/08/15 -- follow up for hx large PE with R heart strain and lytics. He has ben on Eliquis and has tolerated. Dr Alen Blew repeated his hypercoag panel and he did not have lupus anti-coagulant. He needs 12 months of anticoag and then can stop.  He needs to have a R hip sgy and wants me to review with Dr Mayer Camel. I believe it may be better for him to get this done while he is still going to be anticoagulated afterwards    ROV  07/30/15 --  Follow up visit for hx of large PE. He is now s/p hip surgery which went well. No breathing issues. He has been stable, has been on Eliquis x 1 year.   ROV 01/28/16 -- follow-up visit for history of pulmonary embolism. He was treated for one year with Eliquis. Also has a history of tobacco use.  He has seen Dr Osker Mason, no indication for anti-coag. He is walking, is recovering well from hip sgy. He is asymptomatic - not coughing, wheezing. No breathing limitation.     Past Medical History  Diagnosis Date  . Hypertension   . Hyperlipidemia   . Abdominal aortic aneurysm ([place])   . Arthritis     "hips; lower spine" (05/24/2014)  . Personal history of DVT (deep vein thrombosis) 9/15  . PE (pulmonary embolism) 9/15     Family History  Problem Relation Age of Onset  . Clotting disorder Mother   . Diabetes Mellitus I Mother   . Prostate cancer Father      Social History   Social History  . Marital Status: Married    Spouse Name: N/A  . Number of  Children: N/A  . Years of Education: N/A   Occupational History  . Not on file.   Social History Main Topics  . Smoking status: Former Smoker -- 0.50 packs/day for 40 years    Types: Cigarettes    Quit date: 05/05/2009  . Smokeless tobacco: Never Used  . Alcohol Use: No  . Drug Use: No  . Sexual Activity: No   Other Topics Concern  . Not on file   Social History Narrative     No Known Allergies   Outpatient Prescriptions Prior to Visit  Medication Sig Dispense Refill  . pravastatin (PRAVACHOL) 20 MG tablet Take 20 mg by mouth daily.    . cyclobenzaprine (FLEXERIL) 5 MG tablet Take 5 mg by mouth daily as needed for muscle spasms.    Marland Kitchen oxyCODONE-acetaminophen (ROXICET) 5-325 MG per tablet Take 1 tablet by mouth every 4 (four) hours as needed for severe pain. 60 tablet 0  . traMADol (ULTRAM) 50 MG tablet Take 50 mg by mouth every 6 (six) hours as needed.     No facility-administered medications prior to visit.   Filed Vitals:   01/28/16 1202 01/28/16 1203  BP:  114/78  Pulse:  70  Height: '5\' 11"'$  (1.803 m)   Weight: 92.08 kg (  203 lb)   SpO2:  97%   Gen: Pleasant, well-nourished, in no distress,  normal affect  ENT: No lesions,  mouth clear,  oropharynx clear, no postnasal drip  Neck: No JVD, no TMG, no carotid bruits  Lungs: No use of accessory muscles, clear without rales or rhonchi  Cardiovascular: RRR, heart sounds normal, no murmur or gallops, no peripheral edema  Musculoskeletal: No deformities, no cyanosis or clubbing  Neuro: alert, non focal  Skin: Warm, no lesions or rashes   PE (pulmonary embolism) Status post 1 year of anticoagulation. He is doing well without any evidence of recurrence. He is evaluation for hypercoagulable state was negative. No need for intervention at this time  History of tobacco abuse No evidence at this time for dyspnea or obstructive lung disease. We will defer pulmonary function testing until he develops symptoms. He will  come back to see me if he develops shortness of breath, wheezing, cough, sputum    Baltazar Apo, MD, PhD 01/28/2016, 12:23 PM [facility] [phone] or if no answer [phone]

## 2016-01-28 NOTE — Assessment & Plan Note (Signed)
Status post 1 year of anticoagulation. He is doing well without any evidence of recurrence. He is evaluation for hypercoagulable state was negative. No need for intervention at this time

## 2016-02-18 ENCOUNTER — Other Ambulatory Visit: Payer: Self-pay | Admitting: Gastroenterology

## 2016-02-18 DIAGNOSIS — K5520 Angiodysplasia of colon without hemorrhage: Secondary | ICD-10-CM | POA: Diagnosis not present

## 2016-02-18 DIAGNOSIS — K621 Rectal polyp: Secondary | ICD-10-CM | POA: Diagnosis not present

## 2016-02-18 DIAGNOSIS — K5730 Diverticulosis of large intestine without perforation or abscess without bleeding: Secondary | ICD-10-CM | POA: Diagnosis not present

## 2016-02-18 DIAGNOSIS — Z86010 Personal history of colonic polyps: Secondary | ICD-10-CM | POA: Diagnosis not present

## 2016-02-18 DIAGNOSIS — K640 First degree hemorrhoids: Secondary | ICD-10-CM | POA: Diagnosis not present

## 2016-02-27 DIAGNOSIS — H5203 Hypermetropia, bilateral: Secondary | ICD-10-CM | POA: Diagnosis not present

## 2016-02-27 DIAGNOSIS — H2513 Age-related nuclear cataract, bilateral: Secondary | ICD-10-CM | POA: Diagnosis not present

## 2016-09-15 DIAGNOSIS — Z23 Encounter for immunization: Secondary | ICD-10-CM | POA: Diagnosis not present

## 2016-09-19 ENCOUNTER — Emergency Department (HOSPITAL_BASED_OUTPATIENT_CLINIC_OR_DEPARTMENT_OTHER): Payer: Medicare Other

## 2016-09-19 ENCOUNTER — Inpatient Hospital Stay (HOSPITAL_COMMUNITY): Payer: Medicare Other

## 2016-09-19 ENCOUNTER — Inpatient Hospital Stay (HOSPITAL_BASED_OUTPATIENT_CLINIC_OR_DEPARTMENT_OTHER)
Admission: EM | Admit: 2016-09-19 | Discharge: 2016-09-23 | DRG: 176 | Disposition: A | Discharge status: Home / Self Care | Payer: Medicare Other | Attending: Internal Medicine | Admitting: Internal Medicine

## 2016-09-19 ENCOUNTER — Encounter (HOSPITAL_BASED_OUTPATIENT_CLINIC_OR_DEPARTMENT_OTHER): Payer: Self-pay | Admitting: Emergency Medicine

## 2016-09-19 DIAGNOSIS — C3490 Malignant neoplasm of unspecified part of unspecified bronchus or lung: Secondary | ICD-10-CM | POA: Diagnosis present

## 2016-09-19 DIAGNOSIS — Z833 Family history of diabetes mellitus: Secondary | ICD-10-CM

## 2016-09-19 DIAGNOSIS — I2699 Other pulmonary embolism without acute cor pulmonale: Principal | ICD-10-CM | POA: Diagnosis present

## 2016-09-19 DIAGNOSIS — Z86718 Personal history of other venous thrombosis and embolism: Secondary | ICD-10-CM | POA: Diagnosis not present

## 2016-09-19 DIAGNOSIS — I313 Pericardial effusion (noninflammatory): Secondary | ICD-10-CM

## 2016-09-19 DIAGNOSIS — Z7982 Long term (current) use of aspirin: Secondary | ICD-10-CM

## 2016-09-19 DIAGNOSIS — I824Y3 Acute embolism and thrombosis of unspecified deep veins of proximal lower extremity, bilateral: Secondary | ICD-10-CM | POA: Diagnosis not present

## 2016-09-19 DIAGNOSIS — J439 Emphysema, unspecified: Secondary | ICD-10-CM | POA: Diagnosis present

## 2016-09-19 DIAGNOSIS — I824Z1 Acute embolism and thrombosis of unspecified deep veins of right distal lower extremity: Secondary | ICD-10-CM | POA: Diagnosis present

## 2016-09-19 DIAGNOSIS — I248 Other forms of acute ischemic heart disease: Secondary | ICD-10-CM | POA: Diagnosis present

## 2016-09-19 DIAGNOSIS — R748 Abnormal levels of other serum enzymes: Secondary | ICD-10-CM | POA: Diagnosis not present

## 2016-09-19 DIAGNOSIS — Z832 Family history of diseases of the blood and blood-forming organs and certain disorders involving the immune mechanism: Secondary | ICD-10-CM | POA: Diagnosis not present

## 2016-09-19 DIAGNOSIS — K7689 Other specified diseases of liver: Secondary | ICD-10-CM | POA: Diagnosis not present

## 2016-09-19 DIAGNOSIS — J90 Pleural effusion, not elsewhere classified: Secondary | ICD-10-CM | POA: Diagnosis not present

## 2016-09-19 DIAGNOSIS — K769 Liver disease, unspecified: Secondary | ICD-10-CM | POA: Diagnosis not present

## 2016-09-19 DIAGNOSIS — C7989 Secondary malignant neoplasm of other specified sites: Secondary | ICD-10-CM | POA: Diagnosis not present

## 2016-09-19 DIAGNOSIS — E785 Hyperlipidemia, unspecified: Secondary | ICD-10-CM | POA: Diagnosis present

## 2016-09-19 DIAGNOSIS — I3139 Other pericardial effusion (noninflammatory): Secondary | ICD-10-CM

## 2016-09-19 DIAGNOSIS — I472 Ventricular tachycardia, unspecified: Secondary | ICD-10-CM

## 2016-09-19 DIAGNOSIS — M1611 Unilateral primary osteoarthritis, right hip: Secondary | ICD-10-CM | POA: Diagnosis present

## 2016-09-19 DIAGNOSIS — I714 Abdominal aortic aneurysm, without rupture: Secondary | ICD-10-CM | POA: Diagnosis present

## 2016-09-19 DIAGNOSIS — I3131 Malignant pericardial effusion in diseases classified elsewhere: Secondary | ICD-10-CM | POA: Diagnosis present

## 2016-09-19 DIAGNOSIS — I10 Essential (primary) hypertension: Secondary | ICD-10-CM | POA: Diagnosis not present

## 2016-09-19 DIAGNOSIS — I82492 Acute embolism and thrombosis of other specified deep vein of left lower extremity: Secondary | ICD-10-CM | POA: Diagnosis present

## 2016-09-19 DIAGNOSIS — I4729 Other ventricular tachycardia: Secondary | ICD-10-CM

## 2016-09-19 DIAGNOSIS — N281 Cyst of kidney, acquired: Secondary | ICD-10-CM | POA: Diagnosis present

## 2016-09-19 DIAGNOSIS — Z87891 Personal history of nicotine dependence: Secondary | ICD-10-CM | POA: Diagnosis not present

## 2016-09-19 DIAGNOSIS — C801 Malignant (primary) neoplasm, unspecified: Secondary | ICD-10-CM | POA: Diagnosis present

## 2016-09-19 DIAGNOSIS — Z8042 Family history of malignant neoplasm of prostate: Secondary | ICD-10-CM | POA: Diagnosis not present

## 2016-09-19 DIAGNOSIS — Z96643 Presence of artificial hip joint, bilateral: Secondary | ICD-10-CM | POA: Diagnosis present

## 2016-09-19 DIAGNOSIS — I314 Cardiac tamponade: Secondary | ICD-10-CM

## 2016-09-19 DIAGNOSIS — R778 Other specified abnormalities of plasma proteins: Secondary | ICD-10-CM | POA: Diagnosis present

## 2016-09-19 DIAGNOSIS — Z9889 Other specified postprocedural states: Secondary | ICD-10-CM

## 2016-09-19 DIAGNOSIS — R7989 Other specified abnormal findings of blood chemistry: Secondary | ICD-10-CM

## 2016-09-19 DIAGNOSIS — R0602 Shortness of breath: Secondary | ICD-10-CM | POA: Diagnosis not present

## 2016-09-19 DIAGNOSIS — I319 Disease of pericardium, unspecified: Secondary | ICD-10-CM | POA: Diagnosis not present

## 2016-09-19 LAB — BASIC METABOLIC PANEL
ANION GAP: 7 (ref 5–15)
BUN: 16 mg/dL (ref 6–20)
CO2: 26 mmol/L (ref 22–32)
Calcium: 8.9 mg/dL (ref 8.9–10.3)
Chloride: 106 mmol/L (ref 101–111)
Creatinine, Ser: 1.14 mg/dL (ref 0.61–1.24)
GLUCOSE: 106 mg/dL — AB (ref 65–99)
POTASSIUM: 4 mmol/L (ref 3.5–5.1)
SODIUM: 139 mmol/L (ref 135–145)

## 2016-09-19 LAB — MAGNESIUM: MAGNESIUM: 2 mg/dL (ref 1.7–2.4)

## 2016-09-19 LAB — CBC
HCT: 49.5 % (ref 39.0–52.0)
HEMOGLOBIN: 16.4 g/dL (ref 13.0–17.0)
MCH: 31.7 pg (ref 26.0–34.0)
MCHC: 33.1 g/dL (ref 30.0–36.0)
MCV: 95.7 fL (ref 78.0–100.0)
Platelets: ADEQUATE 10*3/uL (ref 150–400)
RBC: 5.17 MIL/uL (ref 4.22–5.81)
RDW: 13.1 % (ref 11.5–15.5)
WBC: 8.9 10*3/uL (ref 4.0–10.5)

## 2016-09-19 LAB — TROPONIN I: TROPONIN I: 0.04 ng/mL — AB (ref <0.03)

## 2016-09-19 MED ORDER — ZOLPIDEM TARTRATE 5 MG PO TABS
5.0000 mg | ORAL_TABLET | Freq: Every evening | ORAL | Status: DC | PRN
  Administered 2016-09-20: 5 mg via ORAL
  Filled 2016-09-19: qty 1

## 2016-09-19 MED ORDER — ACETAMINOPHEN 325 MG PO TABS: 650.0000 mg | ORAL_TABLET | Freq: Four times a day (QID) | ORAL | Status: DC | PRN

## 2016-09-19 MED ORDER — ALBUTEROL SULFATE (2.5 MG/3ML) 0.083% IN NEBU
2.5000 mg | INHALATION_SOLUTION | RESPIRATORY_TRACT | Status: DC | PRN
Start: 2016-09-19 — End: 2016-09-23

## 2016-09-19 MED ORDER — ONDANSETRON HCL 4 MG/2ML IJ SOLN: 4.0000 mg | Freq: Three times a day (TID) | INTRAMUSCULAR | Status: DC | PRN

## 2016-09-19 MED ORDER — DM-GUAIFENESIN ER 30-600 MG PO TB12
1.0000 | ORAL_TABLET | Freq: Two times a day (BID) | ORAL | Status: DC | PRN
  Administered 2016-09-21: 1 via ORAL
  Filled 2016-09-19: qty 1

## 2016-09-19 MED ORDER — ATORVASTATIN CALCIUM 20 MG PO TABS
20.0000 mg | ORAL_TABLET | Freq: Every day | ORAL | Status: DC
  Administered 2016-09-20 – 2016-09-22 (×3): 20 mg via ORAL
  Filled 2016-09-19 (×3): qty 1

## 2016-09-19 MED ORDER — ACETAMINOPHEN 650 MG RE SUPP: 650.0000 mg | Freq: Four times a day (QID) | RECTAL | Status: DC | PRN

## 2016-09-19 MED ORDER — IOPAMIDOL (ISOVUE-370) INJECTION 76%
100.0000 mL | Freq: Once | INTRAVENOUS | Status: AC | PRN
  Administered 2016-09-19: 100 mL via INTRAVENOUS

## 2016-09-19 MED ORDER — HEPARIN (PORCINE) IN NACL 100-0.45 UNIT/ML-% IJ SOLN
1300.0000 [IU]/h | INTRAMUSCULAR | Status: DC
  Administered 2016-09-19 – 2016-09-20 (×2): 1500 [IU]/h via INTRAVENOUS
  Filled 2016-09-19 (×3): qty 250

## 2016-09-19 MED ORDER — MORPHINE SULFATE (PF) 2 MG/ML IV SOLN
2.0000 mg | INTRAVENOUS | Status: DC | PRN
Start: 2016-09-19 — End: 2016-09-23
  Administered 2016-09-21: 2 mg via INTRAVENOUS
  Filled 2016-09-19: qty 1

## 2016-09-19 MED ORDER — SODIUM CHLORIDE 0.9 % IV SOLN
INTRAVENOUS | Status: DC
  Administered 2016-09-20: 01:00:00 via INTRAVENOUS

## 2016-09-19 MED ORDER — HYDROCODONE-ACETAMINOPHEN 5-325 MG PO TABS
1.0000 | ORAL_TABLET | ORAL | Status: DC | PRN
  Administered 2016-09-21 – 2016-09-22 (×5): 2 via ORAL
  Filled 2016-09-19 (×5): qty 2

## 2016-09-19 MED ORDER — ASPIRIN 81 MG PO CHEW
81.0000 mg | CHEWABLE_TABLET | Freq: Every day | ORAL | Status: DC
  Administered 2016-09-20 – 2016-09-23 (×3): 81 mg via ORAL
  Filled 2016-09-19 (×3): qty 1

## 2016-09-19 MED ORDER — SODIUM CHLORIDE 0.9% FLUSH
3.0000 mL | Freq: Two times a day (BID) | INTRAVENOUS | Status: DC
  Administered 2016-09-20 (×3): 3 mL via INTRAVENOUS

## 2016-09-19 MED ORDER — HEPARIN BOLUS VIA INFUSION
5000.0000 [IU] | Freq: Once | INTRAVENOUS | Status: AC
  Administered 2016-09-19: 5000 [IU] via INTRAVENOUS

## 2016-09-19 NOTE — Progress Notes (Signed)
ANTICOAGULATION CONSULT NOTE - Initial Consult  Pharmacy Consult for Heparin Indication: pulmonary embolus  No Known Allergies  Patient Measurements: Height: '5\' 11"'$  (180.3 cm) Weight: 200 lb (90.7 kg) IBW/kg (Calculated) : 75.3 Heparin Dosing Weight: 90.7 kg  Vital Signs: Temp: 97.8 F (36.6 C) (10/21 1555) Temp Source: Oral (10/21 1555) BP: 145/96 (10/21 1731) Pulse Rate: 94 (10/21 1731)  Labs:  Recent Labs  09/19/16 1615  HGB 16.4  HCT 49.5  PLT PLATELETS APPEAR ADEQUATE  CREATININE 1.14  TROPONINI 0.04*    Estimated Creatinine Clearance: 69.5 mL/min (by C-G formula based on SCr of 1.14 mg/dL).   Medical History: Past Medical History:  Diagnosis Date  . Abdominal aortic aneurysm ([place])   . Arthritis    "hips; lower spine" (05/24/2014)  . Hyperlipidemia   . Hypertension   . PE (pulmonary embolism) 9/15  . Personal history of DVT (deep vein thrombosis) 9/15    Medications:   (Not in a hospital admission) Scheduled:  Infusions:   Assessment: 70yo male presents to [facility] with SOB. Pharmacy is consulted to dose heparin for PE.  Goal of Therapy:  Heparin level 0.3-0.7 units/ml Monitor platelets by anticoagulation protocol: Yes   Plan:  Give 5000 units bolus x 1 Start heparin infusion at 1500 units/hr Check anti-Xa level in 8 hours and daily while on heparin Continue to monitor H&H and platelets  [name]. Diona Foley, PharmD, [place] Clinical Pharmacist Pager [phone] 09/19/2016,6:08 PM

## 2016-09-19 NOTE — ED Notes (Signed)
Pt on automatic VS and cardiac monitoring. Family at bedside.

## 2016-09-19 NOTE — H&P (Signed)
History and Physical    EVERHETT BOZARD YQI:[id number] DOB: 08/04/1946 DOA: 09/19/2016  Referring MD/NP/PA:   PCP: [name], MD   Patient coming from:  The patient is coming from home.  At baseline, pt is independent for most of ADL.  Chief Complaint: Shortness of breath  HPI: Nicholas Hale is a 70 y.o. male with medical history significant of DVT/PE s/p EKOS (8 weeks post op, completed 1 yr Eliquid then d/c) hypertension, hyperlipidemia, AAA (stable, 3.7 cm by ultrasound on 01/10/15), large right renal cyst, arthritis in hip and  back, who presents with shortness of breath.  Patient states that he has been having shortness of breath for 3 days, which is exertional. He denies chest pain. He has cough with clear mucus production. No fever or chills. He states that he has been having bilateral calf pain for several days. Patient denies nausea, vomiting, abdominal pain, diarrhea, symptoms of UTI or unilateral weakness. In ED, pt has had some short runs of NSVT while being monitored on telemetry.  ED Course: pt was found to have troponin 0.04, WBC 8.9, electrolytes renal function okay, temperature normal, tachycardia, tachypnea, oxygen saturation 96% on room air. CT scan showed several bilateral pulmonary emboli with right pleural effusion, moderate pericardial effusion and RV/LV ratio of 0.99. Patient is admitted to stepdown bed as inpatient. Cardiology, Dr. Mean was consulted.  Review of Systems:   General: no fevers, chills, no changes in body weight, has fatigue HEENT: no blurry vision, hearing changes or sore throat Respiratory: has dyspnea, coughing, no wheezing CV: no chest pain, no palpitations GI: no nausea, vomiting, abdominal pain, diarrhea, constipation GU: no dysuria, burning on urination, increased urinary frequency, hematuria  Ext: no leg edema. Has tenderness over calf areas bilaterally  Neuro: no unilateral weakness, numbness, or tingling, no vision change or  hearing loss Skin: no rash, no skin tear. MSK: No muscle spasm, no deformity, no limitation of range of movement in spin Heme: No easy bruising.  Travel history: No recent long distant travel.  Allergy: No Known Allergies  Past Medical History:  Diagnosis Date  . Abdominal aortic aneurysm ([place])   . Arthritis    "hips; lower spine" (05/24/2014)  . Hyperlipidemia   . Hypertension   . PE (pulmonary embolism) 9/15  . Personal history of DVT (deep vein thrombosis) 9/15    Past Surgical History:  Procedure Laterality Date  . ANTERIOR FUSION CERVICAL SPINE  ~ 2005  . APPENDECTOMY  1978  . COLONOSCOPY    . HEMIARTHROPLASTY SHOULDER FRACTURE Left 12/2008  . INGUINAL HERNIA REPAIR Bilateral 1992  . JOINT REPLACEMENT    . TOTAL HIP ARTHROPLASTY Left 05/23/2014   Procedure: TOTAL HIP ARTHROPLASTY;  Surgeon: Kerin Salen, MD;  Location: [facility];  Service: Orthopedics;  Laterality: Left;  . TOTAL HIP ARTHROPLASTY Right 05/15/2015   Procedure: TOTAL HIP ARTHROPLASTY;  Surgeon: Frederik Pear, MD;  Location: [facility];  Service: Orthopedics;  Laterality: Right;    Social History:  reports that he quit smoking about 7 years ago. His smoking use included Cigarettes. He has a 20.00 pack-year smoking history. He has never used smokeless tobacco. He reports that he does not drink alcohol or use drugs.  Family History:  Family History  Problem Relation Age of Onset  . Clotting disorder Mother   . Diabetes Mellitus I Mother   . Prostate cancer Father      Prior to Admission medications   Medication Sig Start Date End  Date Taking? Authorizing Provider  atorvastatin (LIPITOR) 20 MG tablet Take 20 mg by mouth daily.   Yes Historical Provider, MD  aspirin 81 MG tablet Take 81 mg by mouth daily.    Historical Provider, MD  pravastatin (PRAVACHOL) 20 MG tablet Take 20 mg by mouth daily.    Historical Provider, MD    Physical Exam: Vitals:   09/19/16 1833 09/19/16 1930 09/19/16 2000 09/20/16 0011  BP:  128/69 133/93 113/71 98/70  Pulse: 102 100 102 (!) 105  Resp: '21 18 22 '$ (!) 21  Temp:    98 F (36.7 C)  TempSrc:    Oral  SpO2: 97% 96% 97% 96%  Weight:      Height:       General: Not in acute distress HEENT:       Eyes: PERRL, EOMI, no scleral icterus.       ENT: No discharge from the ears and nose, no pharynx injection, no tonsillar enlargement.        Neck: No JVD, no bruit, no mass felt. Heme: No neck lymph node enlargement. Cardiac: S1/S2, RRR, No murmurs, No gallops or rubs. Respiratory: Good air movement bilaterally. No rales, wheezing, rhonchi or rubs. GI: Soft, nondistended, nontender, no rebound pain, no organomegaly, BS present. GU: No hematuria Ext: No pitting leg edema bilaterally. 2+DP/PT pulse bilaterally. Has tenderness over calf areas bilaterally  Musculoskeletal: No joint deformities, No joint redness or warmth, no limitation of ROM in spin. Skin: No rashes.  Neuro: Alert, oriented X3, cranial nerves II-XII grossly intact, moves all extremities normally.  Psych: Patient is not psychotic, no suicidal or hemocidal ideation.  Labs on Admission: I have personally reviewed following labs and imaging studies  CBC:  Recent Labs Lab 09/19/16 1615  WBC 8.9  HGB 16.4  HCT 49.5  MCV 95.7  PLT PLATELETS APPEAR ADEQUATE   Basic Metabolic Panel:  Recent Labs Lab 09/19/16 1615 09/19/16 1930  NA 139  --   K 4.0  --   CL 106  --   CO2 26  --   GLUCOSE 106*  --   BUN 16  --   CREATININE 1.14  --   CALCIUM 8.9  --   MG  --  2.0   GFR: Estimated Creatinine Clearance: 69.5 mL/min (by C-G formula based on SCr of 1.14 mg/dL). Liver Function Tests: No results for input(s): AST, ALT, ALKPHOS, BILITOT, PROT, ALBUMIN in the last 168 hours. No results for input(s): LIPASE, AMYLASE in the last 168 hours. No results for input(s): AMMONIA in the last 168 hours. Coagulation Profile:  Recent Labs Lab 09/19/16 2331  INR 1.27   Cardiac Enzymes:  Recent Labs Lab  09/19/16 1615 09/19/16 2331  TROPONINI 0.04* 0.03*   BNP (last 3 results) No results for input(s): PROBNP in the last 8760 hours. HbA1C: No results for input(s): HGBA1C in the last 72 hours. CBG: No results for input(s): GLUCAP in the last 168 hours. Lipid Profile: No results for input(s): CHOL, HDL, LDLCALC, TRIG, CHOLHDL, LDLDIRECT in the last 72 hours. Thyroid Function Tests: No results for input(s): TSH, T4TOTAL, FREET4, T3FREE, THYROIDAB in the last 72 hours. Anemia Panel: No results for input(s): VITAMINB12, FOLATE, FERRITIN, TIBC, IRON, RETICCTPCT in the last 72 hours. Urine analysis:    Component Value Date/Time   COLORURINE YELLOW 05/06/2015 0935   APPEARANCEUR CLEAR 05/06/2015 0935   LABSPEC 1.003 (L) 05/06/2015 0935   PHURINE 6.0 05/06/2015 0935   GLUCOSEU NEGATIVE 05/06/2015 0935  HGBUR NEGATIVE 05/06/2015 0935   BILIRUBINUR NEGATIVE 05/06/2015 0935   KETONESUR NEGATIVE 05/06/2015 0935   PROTEINUR NEGATIVE 05/06/2015 0935   UROBILINOGEN 0.2 05/06/2015 0935   NITRITE NEGATIVE 05/06/2015 0935   LEUKOCYTESUR NEGATIVE 05/06/2015 0935   Sepsis Labs: '@LABRCNTIP'$ (procalcitonin:4,lacticidven:4) )No results found for this or any previous visit (from the past 240 hour(s)).   Radiological Exams on Admission: Dg Chest 2 View  Result Date: 09/19/2016 CLINICAL DATA:  Shortness of breath with exertion EXAM: CHEST  2 VIEW COMPARISON:  05/06/2015 FINDINGS: Cardiac shadow is stable. The lungs are well aerated bilaterally. No focal infiltrate or sizable effusion is seen. Degenerative changes of the thoracic spine are noted. IMPRESSION: No active cardiopulmonary disease. Electronically Signed   By: Inez Catalina M.D.   On: 09/19/2016 16:18   Ct Angio Chest Pe W And/or Wo Contrast  Addendum Date: 09/19/2016   ADDENDUM REPORT: 09/19/2016 18:49 ADDENDUM: Recommend followup CT chest 6 months to document stability of the 8 mm pleural-based nodule left lower lobe. Electronically Signed    By: Marin Olp M.D.   On: 09/19/2016 18:49   Result Date: 09/19/2016 CLINICAL DATA:  Shortness of breath.  History of PD 2015. EXAM: CT ANGIOGRAPHY CHEST WITH CONTRAST TECHNIQUE: Multidetector CT imaging of the chest was performed using the standard protocol during bolus administration of intravenous contrast. Multiplanar CT image reconstructions and MIPs were obtained to evaluate the vascular anatomy. CONTRAST:  100 mL Isovue 370 IV. COMPARISON:  08/08/2014 and 08/05/2014 FINDINGS: Cardiovascular: Examination demonstrates the heart to be normal in size as there is a moderate size pericardial effusion. Thoracic aorta is otherwise within normal. Pulmonary arterial system demonstrates several tiny bilateral pulmonary emboli. RV/LV ratio is 0.99. Tiny focus of calcification along the left anterior descending coronary artery. Mediastinum/Nodes: There are a few shotty mediastinal lymph nodes. There is a 1 cm right hilar lymph node. Remaining mediastinal structures are unremarkable. Lungs/Pleura: Lungs are adequately inflated demonstrate mild paraseptal and centrilobular emphysematous change over the mid to upper lungs. 8 mm pleural based nodule opacity over the left lower lobe. Small right pleural effusion with associated atelectasis. Airways are within normal. Upper Abdomen: Partially visualized 3.4 cm cyst over the upper pole right kidney. 1.2 cm hypodensity along the periphery of the left lobe of the liver. Colonic diverticulosis. Musculoskeletal: Degenerative change of the spine. Review of the MIP images confirms the above findings. IMPRESSION: Several tiny bilateral pulmonary emboli. Small associated right pleural effusion and right basilar atelectasis. RV/LV ratio 0.99 compatible with intermediate risk pulmonary embolism. The presence of right heart strain has been associated with an increased risk of morbidity and mortality. Moderate size pericardial effusion. Mild emphysematous disease. 1.2 cm hypodensity  over the periphery of the left lobe of the liver not well seen previously. Consider MRI on elective basis for further evaluation. Right renal cyst. Critical Value/emergent results were called by telephone at the time of interpretation on 09/19/2016 at 5:42 pm to Dr. Davonna Belling , who verbally acknowledged these results. Electronically Signed: By: Marin Olp M.D. On: 09/19/2016 17:42     EKG: Independently reviewed. QTC 423, low voltage.   Assessment/Plan Principal Problem:   Recurrent pulmonary embolism (HCC) Active Problems:   Hypertension   Hyperlipidemia   Primary osteoarthritis of right hip   Pericardial effusion   Ventricular tachycardia (HCC)   Renal cyst, right   Liver lesion   Nonsustained ventricular tachycardia (HCC)   Elevated troponin   Recurrent pulmonary embolism ([place]): per pt's daughter, patient was  evaluated by hematologist in the past, was tested negative genetically for hypercoagulable status. Patient is mildly tachycardic and had transient V. Tach, but hemodynamically stable.  -admit to stepdown for close monitoring as inpt -heparin drip initiated -2D echocardiogram ordered -LE dopplers ordered to evaluate for DVT -repeat EKG in a.m. -trop x 3  Elevated troponin and nonsustained ventricular tachycardia ([place]): trop 0.04. Likely due to demand ischemia. No CP. He had transient V. Tach, which is likely triggered by PE. Cardiology, Dr. Mean was consulted. - Appreciated cardiologist consultation - cycle CE q6 x3 and repeat her EKG in the am  - Aspirin, lipitor  - Risk factor stratification: will check FLP, TSH and A1C  - 2d echo  Pericardial effusion: No signs of tamponade. Hemodynamically stable -- per dr. Lamona Curl, no acute indication for pericardial fluid drainage, follow up TTE tomorrow  HTN: not on medications at home. Bp normal -Monitoring Bp closely  HLD: Last LDL was not on record  -Continue home medications: Lipitor  Renal cyst, right and  Liver lesion: pt had hx of a large right renal cyst (12 cm by [place] on 01/10/15). He was found to have a 1.2 cm hypodensity over the periphery of the left lobe by CTA today.  Not sure if his large renal cyst could have compressed venous return, contributing to DVT formation. -will get MRI of abdomen for further evaluation -May need to discuss with urology for possible draining of renal cyst.   DVT ppx: In IV Heparin        Code Status: Full code Family Communication:  Yes, patient's daughter and wife at bed side Disposition Plan:  Anticipate discharge back to previous home environment Consults called:  Cardiology, Dr. Mean Admission status: Inpatient/tele      Date of Service 09/20/2016    Ivor Costa [facility] Pager [phone]  If 7PM-7AM, please contact night-coverage [email or web address] Password [email or web address] 09/20/2016, 1:43 AM

## 2016-09-19 NOTE — ED Provider Notes (Addendum)
[facility] DEPT [facility] Provider Note   CSN: [id number] Arrival date & time: 09/19/16  1547  By signing my name below, I, [name], attest that this documentation has been prepared under the direction and in the presence of Davonna Belling, MD . Electronically Signed: [name], ED Scribe. 09/19/2016. 4:15 PM.    History   Chief Complaint Chief Complaint  Patient presents with  . Shortness of Breath    The history is provided by the patient. No language interpreter was used.   HPI Comments:  Nicholas Hale is a 70 y.o. male with PMHx of HTN and PE who presents to the Emergency Department complaining of constant shortness of breath x 2-3 days. Pt complains of an associated productive cough with "thick and salty" sputum. Pt reports that he had a PE after a surgery 8 months ago. Pt states that he has gained 4 lbs over the past 4 days without having eaten very much. Pt is not a smoker. No alleviating factors noted. Pt denies other associated symptoms.   Past Medical History:  Diagnosis Date  . Abdominal aortic aneurysm (Nicholas Hale)   . Arthritis    "hips; lower spine" (05/24/2014)  . Hyperlipidemia   . Hypertension   . PE (pulmonary embolism) 9/15  . Personal history of DVT (deep vein thrombosis) 9/15    Patient Active Problem List   Diagnosis Date Noted  . History of tobacco abuse 01/28/2016  . Primary osteoarthritis of right hip 05/10/2015  . PE (pulmonary embolism) 08/05/2014  . Pulmonary embolism ([place]) 08/05/2014  . Arthritis of left hip 05/23/2014  . Hypertension 05/15/2014  . Hyperlipidemia 05/15/2014    Past Surgical History:  Procedure Laterality Date  . ANTERIOR FUSION CERVICAL SPINE  ~ 2005  . APPENDECTOMY  1978  . COLONOSCOPY    . HEMIARTHROPLASTY SHOULDER FRACTURE Left 12/2008  . INGUINAL HERNIA REPAIR Bilateral 1992  . JOINT REPLACEMENT    . TOTAL HIP ARTHROPLASTY Left 05/23/2014   Procedure: TOTAL HIP ARTHROPLASTY;  Surgeon: Kerin Salen,  MD;  Location: [facility];  Service: Orthopedics;  Laterality: Left;  . TOTAL HIP ARTHROPLASTY Right 05/15/2015   Procedure: TOTAL HIP ARTHROPLASTY;  Surgeon: Frederik Pear, MD;  Location: [facility];  Service: Orthopedics;  Laterality: Right;       Home Medications    Prior to Admission medications   Medication Sig Start Date End Date Taking? Authorizing Provider  atorvastatin (LIPITOR) 20 MG tablet Take 20 mg by mouth daily.   Yes Historical Provider, MD  aspirin 81 MG tablet Take 81 mg by mouth daily.    Historical Provider, MD  pravastatin (PRAVACHOL) 20 MG tablet Take 20 mg by mouth daily.    Historical Provider, MD    Family History Family History  Problem Relation Age of Onset  . Clotting disorder Mother   . Diabetes Mellitus I Mother   . Prostate cancer Father     Social History Social History  Substance Use Topics  . Smoking status: Former Smoker    Packs/day: 0.50    Years: 40.00    Types: Cigarettes    Quit date: 05/05/2009  . Smokeless tobacco: Never Used  . Alcohol use No     Allergies   Review of patient's allergies indicates no known allergies.   Review of Systems Review of Systems  Constitutional: Positive for unexpected weight change.  Respiratory: Positive for cough and shortness of breath.   All other systems reviewed and are negative.  Physical Exam Updated Vital Signs BP 166/82   Pulse 98   Temp 97.8 F (36.6 C) (Oral)   Resp 22   Ht '5\' 11"'$  (1.803 m)   Wt 200 lb (90.7 kg)   SpO2 98%   BMI 27.89 kg/m   Physical Exam  Constitutional: He appears well-developed and well-nourished.  HENT:  Head: Normocephalic and atraumatic.  Eyes: Conjunctivae are normal.  Neck: Neck supple.  Cardiovascular: Regular rhythm.   No murmur heard. Mild tachycardia  Pulmonary/Chest: Effort normal. No respiratory distress.  Mildly harsh breath sounds.  Abdominal: Soft. There is no tenderness.  Musculoskeletal: He exhibits edema.  Trace edema to bilateral lower  legs.   Neurological: He is alert.  Skin: Skin is warm and dry.  Psychiatric: He has a normal mood and affect.  Nursing note and vitals reviewed.    ED Treatments / Results  DIAGNOSTIC STUDIES:  Oxygen Saturation is 99% on RA, normal by my interpretation.    COORDINATION OF CARE:  4:15 PM Discussed treatment plan with pt at bedside and pt agreed to plan.   Labs (all labs ordered are listed, but only abnormal results are displayed) Labs Reviewed  BASIC METABOLIC PANEL - Abnormal; Notable for the following:       Result Value   Glucose, Bld 106 (*)    All other components within normal limits  TROPONIN I - Abnormal; Notable for the following:    Troponin I 0.04 (*)    All other components within normal limits  CBC    EKG  EKG Interpretation  Date/Time:  Saturday September 19 2016 16:15:21 EDT Ventricular Rate:  104 PR Interval:    QRS Duration: 80 QT Interval:  321 QTC Calculation: 423 R Axis:   24 Text Interpretation:  Sinus tachycardia Low voltage, extremity and precordial leads Baseline wander in lead(s) V2 V4 Confirmed by Alvino Chapel  MD, Ovid Curd [phone]) on 09/19/2016 4:20:45 PM Also confirmed by Alvino Chapel  MD, Teniya Filter [phone]), editor Lorenda Cahill CT, [place] [phone])  on 09/19/2016 4:34:09 PM       Radiology Dg Chest 2 View  Result Date: 09/19/2016 CLINICAL DATA:  Shortness of breath with exertion EXAM: CHEST  2 VIEW COMPARISON:  05/06/2015 FINDINGS: Cardiac shadow is stable. The lungs are well aerated bilaterally. No focal infiltrate or sizable effusion is seen. Degenerative changes of the thoracic spine are noted. IMPRESSION: No active cardiopulmonary disease. Electronically Signed   By: Inez Catalina M.D.   On: 09/19/2016 16:18   Ct Angio Chest Pe W And/or Wo Contrast  Result Date: 09/19/2016 CLINICAL DATA:  Shortness of breath.  History of PD 2015. EXAM: CT ANGIOGRAPHY CHEST WITH CONTRAST TECHNIQUE: Multidetector CT imaging of the chest was performed using the standard  protocol during bolus administration of intravenous contrast. Multiplanar CT image reconstructions and MIPs were obtained to evaluate the vascular anatomy. CONTRAST:  100 mL Isovue 370 IV. COMPARISON:  08/08/2014 and 08/05/2014 FINDINGS: Cardiovascular: Examination demonstrates the heart to be normal in size as there is a moderate size pericardial effusion. Thoracic aorta is otherwise within normal. Pulmonary arterial system demonstrates several tiny bilateral pulmonary emboli. RV/LV ratio is 0.99. Tiny focus of calcification along the left anterior descending coronary artery. Mediastinum/Nodes: There are a few shotty mediastinal lymph nodes. There is a 1 cm right hilar lymph node. Remaining mediastinal structures are unremarkable. Lungs/Pleura: Lungs are adequately inflated demonstrate mild paraseptal and centrilobular emphysematous change over the mid to upper lungs. 8 mm pleural based nodule opacity over the left  lower lobe. Small right pleural effusion with associated atelectasis. Airways are within normal. Upper Abdomen: Partially visualized 3.4 cm cyst over the upper pole right kidney. 1.2 cm hypodensity along the periphery of the left lobe of the liver. Colonic diverticulosis. Musculoskeletal: Degenerative change of the spine. Review of the MIP images confirms the above findings. IMPRESSION: Several tiny bilateral pulmonary emboli. Small associated right pleural effusion and right basilar atelectasis. RV/LV ratio 0.99 compatible with intermediate risk pulmonary embolism. The presence of right heart strain has been associated with an increased risk of morbidity and mortality. Moderate size pericardial effusion. Mild emphysematous disease. 1.2 cm hypodensity over the periphery of the left lobe of the liver not well seen previously. Consider MRI on elective basis for further evaluation. Right renal cyst. Critical Value/emergent results were called by telephone at the time of interpretation on 09/19/2016 at 5:42  pm to Dr. Davonna Belling , who verbally acknowledged these results. Electronically Signed   By: Marin Olp M.D.   On: 09/19/2016 17:42    Procedures Procedures (including critical care time)  Medications Ordered in ED Medications  heparin bolus via infusion 5,000 Units (not administered)  heparin ADULT infusion 100 units/mL (25000 units/234m sodium chloride 0.45%) (not administered)  iopamidol (ISOVUE-370) 76 % injection 100 mL (100 mLs Intravenous Contrast Given 09/19/16 1655)     Initial Impression / Assessment and Plan / ED Course  I have reviewed the triage vital signs and the nursing notes.  Pertinent labs & imaging results that were available during my care of the patient were reviewed by me and considered in my medical decision making (see chart for details).  Clinical Course    Patient presents with shortness of breath. Has had it for the last few weeks. More short of breath with exertion. Found to have small bilateral pulmonary embolism. Previous history of PE that required catheter directed TPA. Also found to have a moderate pericardial effusion. Patient is not hypoxic at rest. Not hypotensive. He has mild tachycardia. Patient did have short runs of nonsustained V. tach. Longest was 11 beats. .Troponin is minimally elevated at 0.04. Discussed with Dr. SOletta Darterfrom critical care. Recommend patient go to stepdown on heparin.  CRITICAL CARE Performed by: PMackie PaiTotal critical care time: 30 minutes Critical care time was exclusive of separately billable procedures and treating other patients. Critical care was necessary to treat or prevent imminent or life-threatening deterioration. Critical care was time spent personally by me on the following activities: development of treatment plan with patient and/or surrogate as well as nursing, discussions with consultants, evaluation of patient's response to treatment, examination of patient, obtaining history from patient  or surrogate, ordering and performing treatments and interventions, ordering and review of laboratory studies, ordering and review of radiographic studies, pulse oximetry and re-evaluation of patient's condition.   Final Clinical Impressions(s) / ED Diagnoses   Final diagnoses:  Other acute pulmonary embolism without acute cor pulmonale (HCC)  Pericardial effusion  Nonsustained ventricular tachycardia (HCC)    New Prescriptions New Prescriptions   No medications on file  I personally performed the services described in this documentation, which was scribed in my presence. The recorded information has been reviewed and is accurate.       NDavonna Belling MD 09/19/16 1817  Discussed with Dr. DShanon Brow who requested I talk to cardiology. Discussed with Dr. means for cardiology who states he will see the patient once again to stepdown. No new medicines needed until he sees the patient.  Davonna Belling, MD 09/19/16 [phone]

## 2016-09-19 NOTE — ED Notes (Signed)
PT in CT.

## 2016-09-19 NOTE — Consult Note (Signed)
Chief Complaint/Reason for Consult: elevated troponin, pericardial fluid on CT   Requesting Physician: Blaine Hamper   PCP:  [name], MD Primary Cardiologist: N/A  HPI:   70 YO male with history of previous DVT/PE s/p EKOS (8 weeks post op, completed 1 yr Eliquid then d/c), AAA, HLD, HTN presenting with 3 days of shortness of breath.  Presented to [facility] and CT scan showed several bilateral pulmonary emboli with right pleural effusion, moderate pericardial effusion and RV/LV ratio of 0.99.    He was started on anticoagulation and sent to [facility] stepdown on medicine service.  He has had some short runs of NSVT while being monitored on telemetry.  On interview, he notes that he feels well, no issues at rest but has noted progressive dyspnea on exertion for the last 3 days and an associated cough with clear sputum but no fevers or chills.  He denies history of connective tissue diseases, cancer.  Previous smoker (and mild emphysema on CT today).  Rare Etoh.  No early CAD in family history.  No hemoptysis.  No chest pain.  No edema or orthopnea.  No palpitations.    Previous cardiac studies TTE 08/07/14 - Left ventricle: The cavity size was normal. Systolic function was normal. The estimated ejection fraction was in the range of 60% to 65%. Wall motion was normal; there were no regional wall motion abnormalities. - Mitral valve: There was mild regurgitation. - Right ventricle: Systolic function was mildly reduced. Moderate hypokinesis of the RV apex.  Past Medical History:  Diagnosis Date  . Abdominal aortic aneurysm ([place])   . Arthritis    "hips; lower spine" (05/24/2014)  . Hyperlipidemia   . Hypertension   . PE (pulmonary embolism) 9/15  . Personal history of DVT (deep vein thrombosis) 9/15    Past Surgical History:  Procedure Laterality Date  . ANTERIOR FUSION CERVICAL SPINE  ~ 2005  . APPENDECTOMY  1978  . COLONOSCOPY    . HEMIARTHROPLASTY SHOULDER FRACTURE Left  12/2008  . INGUINAL HERNIA REPAIR Bilateral 1992  . JOINT REPLACEMENT    . TOTAL HIP ARTHROPLASTY Left 05/23/2014   Procedure: TOTAL HIP ARTHROPLASTY;  Surgeon: Kerin Salen, MD;  Location: [facility];  Service: Orthopedics;  Laterality: Left;  . TOTAL HIP ARTHROPLASTY Right 05/15/2015   Procedure: TOTAL HIP ARTHROPLASTY;  Surgeon: Frederik Pear, MD;  Location: [facility];  Service: Orthopedics;  Laterality: Right;    Family History  Problem Relation Age of Onset  . Clotting disorder Mother   . Diabetes Mellitus I Mother   . Prostate cancer Father    Social History:  reports that he quit smoking about 7 years ago. His smoking use included Cigarettes. He has a 20.00 pack-year smoking history. He has never used smokeless tobacco. He reports that he does not drink alcohol or use drugs.  Allergies: No Known Allergies  No current facility-administered medications on file prior to encounter.    Current Outpatient Prescriptions on File Prior to Encounter  Medication Sig Dispense Refill  . aspirin 81 MG tablet Take 81 mg by mouth daily.    . pravastatin (PRAVACHOL) 20 MG tablet Take 20 mg by mouth daily.     Results for orders placed or performed during the hospital encounter of 09/19/16 (from the past 48 hour(s))  Basic metabolic panel     Status: Abnormal   Collection Time: 09/19/16  4:15 PM  Result Value Ref Range   Sodium 139 135 - 145 mmol/L  Potassium 4.0 3.5 - 5.1 mmol/L   Chloride 106 101 - 111 mmol/L   CO2 26 22 - 32 mmol/L   Glucose, Bld 106 (H) 65 - 99 mg/dL   BUN 16 6 - 20 mg/dL   Creatinine, Ser 1.14 0.61 - 1.24 mg/dL   Calcium 8.9 8.9 - 10.3 mg/dL   GFR calc non Af Amer >60 >60 mL/min   GFR calc Af Amer >60 >60 mL/min    Comment: (NOTE) The eGFR has been calculated using the CKD EPI equation. This calculation has not been validated in all clinical situations. eGFR's persistently <60 mL/min signify possible Chronic Kidney Disease.    Anion gap 7 5 - 15  CBC     Status: None    Collection Time: 09/19/16  4:15 PM  Result Value Ref Range   WBC 8.9 4.0 - 10.5 K/uL   RBC 5.17 4.22 - 5.81 MIL/uL   Hemoglobin 16.4 13.0 - 17.0 g/dL   HCT 49.5 39.0 - 52.0 %   MCV 95.7 78.0 - 100.0 fL   MCH 31.7 26.0 - 34.0 pg   MCHC 33.1 30.0 - 36.0 g/dL   RDW 13.1 11.5 - 15.5 %   Platelets PLATELETS APPEAR ADEQUATE 150 - 400 K/uL  Troponin I     Status: Abnormal   Collection Time: 09/19/16  4:15 PM  Result Value Ref Range   Troponin I 0.04 (HH) <0.03 ng/mL    Comment: CRITICAL RESULT CALLED TO, READ BACK BY AND VERIFIED WITH: Keli C.RN @ 4166 ON 09/19/2016 BY Punj[place],G   Magnesium     Status: None   Collection Time: 09/19/16  7:30 PM  Result Value Ref Range   Magnesium 2.0 1.7 - 2.4 mg/dL   Dg Chest 2 View  Result Date: 09/19/2016 CLINICAL DATA:  Shortness of breath with exertion EXAM: CHEST  2 VIEW COMPARISON:  05/06/2015 FINDINGS: Cardiac shadow is stable. The lungs are well aerated bilaterally. No focal infiltrate or sizable effusion is seen. Degenerative changes of the thoracic spine are noted. IMPRESSION: No active cardiopulmonary disease. Electronically Signed   By: Inez Catalina M.D.   On: 09/19/2016 16:18   Ct Angio Chest Pe W And/or Wo Contrast  Addendum Date: 09/19/2016   ADDENDUM REPORT: 09/19/2016 18:49 ADDENDUM: Recommend followup CT chest 6 months to document stability of the 8 mm pleural-based nodule left lower lobe. Electronically Signed   By: Marin Olp M.D.   On: 09/19/2016 18:49   Result Date: 09/19/2016 CLINICAL DATA:  Shortness of breath.  History of PD 2015. EXAM: CT ANGIOGRAPHY CHEST WITH CONTRAST TECHNIQUE: Multidetector CT imaging of the chest was performed using the standard protocol during bolus administration of intravenous contrast. Multiplanar CT image reconstructions and MIPs were obtained to evaluate the vascular anatomy. CONTRAST:  100 mL Isovue 370 IV. COMPARISON:  08/08/2014 and 08/05/2014 FINDINGS: Cardiovascular: Examination demonstrates the  heart to be normal in size as there is a moderate size pericardial effusion. Thoracic aorta is otherwise within normal. Pulmonary arterial system demonstrates several tiny bilateral pulmonary emboli. RV/LV ratio is 0.99. Tiny focus of calcification along the left anterior descending coronary artery. Mediastinum/Nodes: There are a few shotty mediastinal lymph nodes. There is a 1 cm right hilar lymph node. Remaining mediastinal structures are unremarkable. Lungs/Pleura: Lungs are adequately inflated demonstrate mild paraseptal and centrilobular emphysematous change over the mid to upper lungs. 8 mm pleural based nodule opacity over the left lower lobe. Small right pleural effusion with associated atelectasis. Airways are within  normal. Upper Abdomen: Partially visualized 3.4 cm cyst over the upper pole right kidney. 1.2 cm hypodensity along the periphery of the left lobe of the liver. Colonic diverticulosis. Musculoskeletal: Degenerative change of the spine. Review of the MIP images confirms the above findings. IMPRESSION: Several tiny bilateral pulmonary emboli. Small associated right pleural effusion and right basilar atelectasis. RV/LV ratio 0.99 compatible with intermediate risk pulmonary embolism. The presence of right heart strain has been associated with an increased risk of morbidity and mortality. Moderate size pericardial effusion. Mild emphysematous disease. 1.2 cm hypodensity over the periphery of the left lobe of the liver not well seen previously. Consider MRI on elective basis for further evaluation. Right renal cyst. Critical Value/emergent results were called by telephone at the time of interpretation on 09/19/2016 at 5:42 pm to Dr. Davonna Belling , who verbally acknowledged these results. Electronically Signed: By: Marin Olp M.D. On: 09/19/2016 17:42    ECG/TELE: Sinus tachycardia (when compared to previous, significant new low voltage)  ROS: As above. Otherwise, review of systems is  negative unless per above HPI  Vitals:   09/19/16 1800 09/19/16 1833 09/19/16 1930 09/19/16 2000  BP: 166/82 128/69 133/93 113/71  Pulse: 98 102 100 102  Resp: '22 21 18 22  '$ Temp:      TempSrc:      SpO2: 98% 97% 96% 97%  Weight:      Height:       Wt Readings from Last 10 Encounters:  09/19/16 90.7 kg (200 lb)  01/28/16 92.1 kg (203 lb)  08/06/15 86 kg (189 lb 9.6 oz)  07/30/15 88 kg (194 lb)  05/15/15 94.3 kg (208 lb)  05/06/15 94.3 kg (208 lb)  03/13/15 97.9 kg (215 lb 12.8 oz)  03/08/15 96.2 kg (212 lb)  12/25/14 102 kg (224 lb 12.8 oz)  08/29/14 94.7 kg (208 lb 12.8 oz)    PE:  General: No acute distress HEENT: Atraumatic, EOMI, mucous membranes moist CV: RRR no murmurs, gallops. JVD ~10-15 cm at 45 degrees. No HJR.  Respiratory: Clear, no crackles, mild decrease r base. Normal work of breathing ABD: Non-distended and non-tender. No palpable organomegaly.  Extremities: 2+ radial pulses bilaterally. Trivial bilateral LE edema. 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  Unclear if right heart dysfunction seen on CT is acute on chronic from previous PE or purely acute.  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  No chest pain, troponin elevation likely reflective of PE/right heart strain.  He is hypertensive at this time, no clinical signs of tamponade from effusion seen on CT.   Recommendations: - TTE (note left to prioritize tomorrow) Stephanie Coup PE management per medicine, agree with anticoagulation. No acute need for intervention overnight (previously had EKOS for first PE) - trend troponin, daily ECG - No acute indication for pericardial fluid drainage, follow up TTE tomorrow - Ensure  K 4 and Mg 2 given short runs of NSVT on monitor.   - Check A1c, lipids, and TSH  Lolita Cram Means  MD 09/19/2016, 10:58 PM

## 2016-09-19 NOTE — ED Triage Notes (Addendum)
Patient reports that over the last 2 -3 days he has been very SOB with excursion. The patient reports that he has PE in the past, patient reports that he has also has gained 4 pounds over the last 2 -3 days without eating anything. The patient is also Hypertensive which is abnormal for him

## 2016-09-19 NOTE — ED Notes (Signed)
Pt had 11-beat run of V-tach. MD aware; pt denies experiencing dizziness, sob, or any other symptoms during episode.

## 2016-09-20 ENCOUNTER — Inpatient Hospital Stay (HOSPITAL_COMMUNITY): Payer: Medicare Other

## 2016-09-20 DIAGNOSIS — R778 Other specified abnormalities of plasma proteins: Secondary | ICD-10-CM | POA: Diagnosis present

## 2016-09-20 DIAGNOSIS — I2699 Other pulmonary embolism without acute cor pulmonale: Secondary | ICD-10-CM

## 2016-09-20 DIAGNOSIS — R7989 Other specified abnormal findings of blood chemistry: Secondary | ICD-10-CM

## 2016-09-20 DIAGNOSIS — I313 Pericardial effusion (noninflammatory): Secondary | ICD-10-CM

## 2016-09-20 DIAGNOSIS — I3139 Other pericardial effusion (noninflammatory): Secondary | ICD-10-CM

## 2016-09-20 DIAGNOSIS — I319 Disease of pericardium, unspecified: Secondary | ICD-10-CM

## 2016-09-20 HISTORY — DX: Other pericardial effusion (noninflammatory): I31.39

## 2016-09-20 HISTORY — DX: Pericardial effusion (noninflammatory): I31.3

## 2016-09-20 LAB — LIPID PANEL
Cholesterol: 94 mg/dL (ref 0–200)
HDL: 23 mg/dL — AB (ref >40)
LDL CALC: 50 mg/dL (ref 0–99)
Total CHOL/HDL Ratio: 4.1 RATIO
Triglycerides: 106 mg/dL (ref <150)
VLDL: 21 mg/dL (ref 0–40)

## 2016-09-20 LAB — HEPARIN LEVEL (UNFRACTIONATED)
HEPARIN UNFRACTIONATED: 0.82 [IU]/mL — AB (ref 0.30–0.70)
Heparin Unfractionated: 0.6 IU/mL (ref 0.30–0.70)
Heparin Unfractionated: 0.58 IU/mL (ref 0.30–0.70)

## 2016-09-20 LAB — ECHOCARDIOGRAM COMPLETE
CHL CUP MV DEC (S): 209
E decel time: 209 msec
E/e' ratio: 6
FS: 32 % (ref 28–44)
Height: 71 in
IVS/LV PW RATIO, ED: 0.79
LA diam index: 1.32 cm/m2
LA vol A4C: 25.9 ml
LASIZE: 28 mm
LAVOL: 27.4 mL
LAVOLIN: 12.9 mL/m2
LEFT ATRIUM END SYS DIAM: 28 mm
LV E/e'average: 6
LV PW d: 10.7 mm — AB (ref 0.6–1.1)
LVEEMED: 6
LVELAT: 9.79 cm/s
LVOT area: 3.14 cm2
LVOT diameter: 20 mm
MVPKAVEL: 78.1 m/s
MVPKEVEL: 58.7 m/s
TDI e' lateral: 9.79
TDI e' medial: 5
Weight: 3245.17 oz

## 2016-09-20 LAB — BASIC METABOLIC PANEL
Anion gap: 8 (ref 5–15)
BUN: 14 mg/dL (ref 6–20)
CHLORIDE: 109 mmol/L (ref 101–111)
CO2: 21 mmol/L — AB (ref 22–32)
CREATININE: 1.08 mg/dL (ref 0.61–1.24)
Calcium: 8.3 mg/dL — ABNORMAL LOW (ref 8.9–10.3)
GFR calc Af Amer: >60 mL/min (ref >60)
GFR calc non Af Amer: >60 mL/min (ref >60)
Glucose, Bld: 118 mg/dL — ABNORMAL HIGH (ref 65–99)
Potassium: 4.1 mmol/L (ref 3.5–5.1)
SODIUM: 138 mmol/L (ref 135–145)

## 2016-09-20 LAB — TROPONIN I
TROPONIN I: 0.03 ng/mL — AB (ref <0.03)
Troponin I: <0.03 ng/mL (ref <0.03)
Troponin I: <0.03 ng/mL (ref <0.03)

## 2016-09-20 LAB — CBC
HCT: 44.7 % (ref 39.0–52.0)
Hemoglobin: 14.9 g/dL (ref 13.0–17.0)
MCH: 31.6 pg (ref 26.0–34.0)
MCHC: 33.3 g/dL (ref 30.0–36.0)
MCV: 94.7 fL (ref 78.0–100.0)
PLATELETS: 108 10*3/uL — AB (ref 150–400)
RBC: 4.72 MIL/uL (ref 4.22–5.81)
RDW: 12.9 % (ref 11.5–15.5)
WBC: 8.8 10*3/uL (ref 4.0–10.5)

## 2016-09-20 LAB — MRSA PCR SCREENING: MRSA BY PCR: NEGATIVE

## 2016-09-20 LAB — TSH
TSH: 3.626 u[IU]/mL (ref 0.350–4.500)
TSH: 3.478 u[IU]/mL (ref 0.350–4.500)

## 2016-09-20 LAB — PROTIME-INR
INR: 1.27
PROTHROMBIN TIME: 16 s — AB (ref 11.4–15.2)

## 2016-09-20 LAB — GLUCOSE, CAPILLARY: GLUCOSE-CAPILLARY: 126 mg/dL — AB (ref 65–99)

## 2016-09-20 MED ORDER — SODIUM CHLORIDE 0.9 % IV SOLN
INTRAVENOUS | Status: DC
  Administered 2016-09-21 – 2016-09-22 (×2): via INTRAVENOUS

## 2016-09-20 MED ORDER — GADOBENATE DIMEGLUMINE 529 MG/ML IV SOLN
19.0000 mL | Freq: Once | INTRAVENOUS | Status: AC | PRN
  Administered 2016-09-20: 19 mL via INTRAVENOUS

## 2016-09-20 NOTE — Progress Notes (Signed)
ANTICOAGULATION CONSULT NOTE - Follow-up Consult  Pharmacy Consult for Heparin Indication: pulmonary embolus  No Known Allergies  Patient Measurements: Height: '5\' 11"'$  (180.3 cm) Weight: 202 lb 13.2 oz (92 kg) IBW/kg (Calculated) : 75.3 Heparin Dosing Weight: 90.7 kg  Vital Signs: Temp: 98.1 F (36.7 C) (10/22 0327) Temp Source: Oral (10/22 0327) BP: 130/87 (10/22 0327) Pulse Rate: 102 (10/22 0327)  Labs:  Recent Labs  09/19/16 1615 09/19/16 2331 09/20/16 0155  HGB 16.4  --   --   HCT 49.5  --   --   PLT PLATELETS APPEAR ADEQUATE  --   --   LABPROT  --  16.0*  --   INR  --  1.27  --   HEPARINUNFRC  --   --  0.60  CREATININE 1.14  --   --   TROPONINI 0.04* 0.03*  --     Estimated Creatinine Clearance: 69.9 mL/min (by C-G formula based on SCr of 1.14 mg/dL).  Assessment: 70yo male presents to [facility] with SOB. Pharmacy is consulted to dose heparin for several small b/l PE seen on CT. Heparin level therapeutic (0.6) on gtt at 1500 units/hr. No bleeding noted.  Goal of Therapy:  Heparin level 0.3-0.7 units/ml Monitor platelets by anticoagulation protocol: Yes   Plan:  Continue heparin at 1500 units/hr Will f/u 6hr confirmatory heparin level  Sherlon Handing, PharmD, BCPS Clinical pharmacist, pager [phone] 09/20/2016,4:32 AM

## 2016-09-20 NOTE — Progress Notes (Signed)
VASCULAR LAB PRELIMINARY  PRELIMINARY  PRELIMINARY  PRELIMINARY  Bilateral lower extremity venous duplex completed.    Preliminary report:  There is acute, mobile thrombus noted in the right soleal vein.  Acute DVT noted in the right gastrocnemius and peroneal veins and acute DVT noted in the left peroneal vein.    Called results to [place], RN  Jeanenne Licea, [place], RVT 09/20/2016, 12:07 PM

## 2016-09-20 NOTE — Progress Notes (Addendum)
ANTICOAGULATION CONSULT NOTE - Initial Consult  Pharmacy Consult for Heparin Indication: pulmonary embolus  No Known Allergies  Patient Measurements: Height: '5\' 11"'$  (180.3 cm) Weight: 202 lb 13.2 oz (92 kg) IBW/kg (Calculated) : 75.3 Heparin Dosing Weight: 90.7 kg  Vital Signs: Temp: 98 F (36.7 C) (10/22 0644) Temp Source: Oral (10/22 0644) BP: 125/85 (10/22 0800) Pulse Rate: 95 (10/22 0800)  Labs:  Recent Labs  09/19/16 1615 09/19/16 2331 09/20/16 0155 09/20/16 0533  HGB 16.4  --   --  14.9  HCT 49.5  --   --  44.7  PLT PLATELETS APPEAR ADEQUATE  --   --  108*  LABPROT  --  16.0*  --   --   INR  --  1.27  --   --   HEPARINUNFRC  --   --  0.60  --   CREATININE 1.14  --   --  1.08  TROPONINI 0.04* 0.03*  --  <0.03    Estimated Creatinine Clearance: 73.8 mL/min (by C-G formula based on SCr of 1.08 mg/dL).   Medical History: Past Medical History:  Diagnosis Date  . Abdominal aortic aneurysm ([place])   . Arthritis    "hips; lower spine" (05/24/2014)  . Hyperlipidemia   . Hypertension   . PE (pulmonary embolism) 9/15  . 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 aspirin 81 MG tablet Take 81 mg by mouth daily.   09/19/2016  . atorvastatin (LIPITOR) 40 MG tablet Take 40 mg by mouth daily.   09/18/2016   Scheduled:  Infusions:   Assessment: 70yo male presents to [facility] with SOB. Pharmacy is consulted to dose heparin for PE.  First heparin level was therapeutic at 0.60 units/mL. H/H is within normal limits, but platelet count is low at 108 k/uL. Will continue to monitor this closely. No bleeding noted.   Goal of Therapy:  Heparin level 0.3-0.7 units/ml Monitor platelets by anticoagulation protocol: Yes   Plan:  Continue Heparin 1500 units/hr IV infusion 1300 8-hour heparin level to confirm therapeutic (in ECHO currently, pharmacy following up)  Daily HL  and CBCs while on heparin  Monitor for signs/symptoms of bleeding  F/U plan for oral anticoagulation upon discharge  Demetrius Charity, PharmD Acute Care Pharmacy Resident  Pager: [phone] 09/20/2016  _______________________________________________  Addendum:  Confirmatory heparin level came back supratherapeutic at 0.82 units/mL. Will decrease dose.   Plan: Dec heparin infusion to 1300 units/hr 2300 anti-Xa level and daily while on heparin Continue to monitor H&H and platelets (pltc low 10/22) Daily HL and CBC  F/U tx to [facility]

## 2016-09-20 NOTE — Progress Notes (Signed)
Patient Name: Nicholas Hale Date of Encounter: 09/20/2016  Primary Cardiologist: [facility] Problem List     Principal Problem:   Recurrent pulmonary embolism [facility]) Active Problems:   Hypertension   Hyperlipidemia   Primary osteoarthritis of right hip   Pericardial effusion   Ventricular tachycardia (HCC)   Renal cyst, right   Liver lesion   Nonsustained ventricular tachycardia (HCC)   Elevated troponin     Subjective   Downstairs getting studies  Inpatient Medications    Scheduled Meds: . aspirin  81 mg Oral Daily  . atorvastatin  20 mg Oral q1800  . sodium chloride flush  3 mL Intravenous Q12H   Continuous Infusions: . heparin 1,500 Units/hr (09/20/16 0932)   PRN Meds: acetaminophen **OR** acetaminophen, albuterol, dextromethorphan-guaiFENesin, HYDROcodone-acetaminophen, morphine injection, ondansetron, zolpidem   Vital Signs    Vitals:   09/20/16 0011 09/20/16 0327 09/20/16 0644 09/20/16 0800  BP: 98/70 130/87 130/90 125/85  Pulse: (!) 105 (!) 102 (!) 102 95  Resp: (!) '21 12 19 18  '$ Temp: 98 F (36.7 C) 98.1 F (36.7 C) 98 F (36.7 C)   TempSrc: Oral Oral Oral   SpO2: 96% 94% 94% 97%  Weight:      Height:        Intake/Output Summary (Last 24 hours) at 09/20/16 1156 Last data filed at 09/20/16 0645  Gross per 24 hour  Intake              375 ml  Output              250 ml  Net              125 ml   Filed Weights   09/19/16 1555 09/19/16 2109  Weight: 200 lb (90.7 kg) 202 lb 13.2 oz (92 kg)    Physical Exam    GEN: Well nourished, well developed, in no acute distress.  HEENT: Grossly normal.  Neck: Supple, no JVD, carotid bruits, or masses. Cardiac: RRR, no murmurs, rubs, or gallops. No clubbing, cyanosis, edema.  Radials/DP/PT 2+ and equal bilaterally.  Respiratory:  Respirations regular and unlabored, clear to auscultation bilaterally. GI: Soft, nontender, nondistended, BS + x 4. MS: no deformity or atrophy. Skin: warm  and dry, no rash. Neuro:  Strength and sensation are intact. Psych: AAOx3.  Normal affect.  Labs    CBC  Recent Labs  09/19/16 1615 09/20/16 0533  WBC 8.9 8.8  HGB 16.4 14.9  HCT 49.5 44.7  MCV 95.7 94.7  PLT PLATELETS APPEAR ADEQUATE 144*   Basic Metabolic Panel  Recent Labs  09/19/16 1615 09/19/16 1930 09/20/16 0533  NA 139  --  138  K 4.0  --  4.1  CL 106  --  109  CO2 26  --  21*  GLUCOSE 106*  --  118*  BUN 16  --  14  CREATININE 1.14  --  1.08  CALCIUM 8.9  --  8.3*  MG  --  2.0  --    Liver Function Tests No results for input(s): AST, ALT, ALKPHOS, BILITOT, PROT, ALBUMIN in the last 72 hours. No results for input(s): LIPASE, AMYLASE in the last 72 hours. Cardiac Enzymes  Recent Labs  09/19/16 1615 09/19/16 2331 09/20/16 0533  TROPONINI 0.04* 0.03* <0.03   BNP Invalid input(s): POCBNP D-Dimer No results for input(s): DDIMER in the last 72 hours. Hemoglobin A1C No results for input(s): HGBA1C in the last 72 hours. Fasting Lipid  Panel  Recent Labs  09/20/16 0533  CHOL 94  HDL 23*  LDLCALC 50  TRIG 106  CHOLHDL 4.1   Thyroid Function Tests  Recent Labs  09/20/16 0533  TSH 3.478    Telemetry    NSR with some NSVT  - Personally Reviewed  ECG    HR 94 Normal sinus rhythm Low voltage QRS   - Personally Reviewed  Radiology    Dg Chest 2 View  Result Date: 09/19/2016 CLINICAL DATA:  Shortness of breath with exertion EXAM: CHEST  2 VIEW COMPARISON:  05/06/2015 FINDINGS: Cardiac shadow is stable. The lungs are well aerated bilaterally. No focal infiltrate or sizable effusion is seen. Degenerative changes of the thoracic spine are noted. IMPRESSION: No active cardiopulmonary disease. Electronically Signed   By: Inez Catalina M.D.   On: 09/19/2016 16:18   Ct Angio Chest Pe W And/or Wo Contrast  Addendum Date: 09/19/2016   ADDENDUM REPORT: 09/19/2016 18:49 ADDENDUM: Recommend followup CT chest 6 months to document stability of the 8  mm pleural-based nodule left lower lobe. Electronically Signed   By: Marin Olp M.D.   On: 09/19/2016 18:49   Result Date: 09/19/2016 CLINICAL DATA:  Shortness of breath.  History of PD 2015. EXAM: CT ANGIOGRAPHY CHEST WITH CONTRAST TECHNIQUE: Multidetector CT imaging of the chest was performed using the standard protocol during bolus administration of intravenous contrast. Multiplanar CT image reconstructions and MIPs were obtained to evaluate the vascular anatomy. CONTRAST:  100 mL Isovue 370 IV. COMPARISON:  08/08/2014 and 08/05/2014 FINDINGS: Cardiovascular: Examination demonstrates the heart to be normal in size as there is a moderate size pericardial effusion. Thoracic aorta is otherwise within normal. Pulmonary arterial system demonstrates several tiny bilateral pulmonary emboli. RV/LV ratio is 0.99. Tiny focus of calcification along the left anterior descending coronary artery. Mediastinum/Nodes: There are a few shotty mediastinal lymph nodes. There is a 1 cm right hilar lymph node. Remaining mediastinal structures are unremarkable. Lungs/Pleura: Lungs are adequately inflated demonstrate mild paraseptal and centrilobular emphysematous change over the mid to upper lungs. 8 mm pleural based nodule opacity over the left lower lobe. Small right pleural effusion with associated atelectasis. Airways are within normal. Upper Abdomen: Partially visualized 3.4 cm cyst over the upper pole right kidney. 1.2 cm hypodensity along the periphery of the left lobe of the liver. Colonic diverticulosis. Musculoskeletal: Degenerative change of the spine. Review of the MIP images confirms the above findings. IMPRESSION: Several tiny bilateral pulmonary emboli. Small associated right pleural effusion and right basilar atelectasis. RV/LV ratio 0.99 compatible with intermediate risk pulmonary embolism. The presence of right heart strain has been associated with an increased risk of morbidity and mortality. Moderate size  pericardial effusion. Mild emphysematous disease. 1.2 cm hypodensity over the periphery of the left lobe of the liver not well seen previously. Consider MRI on elective basis for further evaluation. Right renal cyst. Critical Value/emergent results were called by telephone at the time of interpretation on 09/19/2016 at 5:42 pm to Dr. Davonna Belling , who verbally acknowledged these results. Electronically Signed: By: Marin Olp M.D. On: 09/19/2016 17:42    Cardiac Studies   2D ECHO pending  Patient Profile     70 YO male with history of previous DVT/PE s/p EKOS (8 weeks post op, completed 1 yr Eliquis then d/c), AAA, HLD, HTN who presented to [facility] 09/19[facility]16 with 3 days of shortness of breath. He was found to have several bilateral pulmonary emboli with right pleural effusion, moderate  pericardial effusion and NSVT and transferred [facility] stepdown on medicine service   Assessment & Plan    Recurrent PE: with evidence of right heart strain on CT scan and mildly elevated troponin (0.04). Started on heparin. 2D ECHO pending. He will likely need long term OAC  Moderate pericardial effusion on CT scan (new compared to prior CT): 2D ECHO pending. I have changed this to stat and paged echo department to have done sooner than later.   HTN: fortunately BP has remained stable.  HLD: continue statin   NSVT: continue to monitor  Signed, Angelena Form, PA-C  09/20/2016, 11:56 AM   Attending Note:   The patient was seen and examined.  Agree with assessment and plan as noted above.  Changes made to the above note as needed.  Patient seen and independently examined with Nell Range, PA.   We discussed all aspects of the encounter. I agree with the assessment and plan as stated above.  I have personally reviewed the echo images and the venous duplex images. He has a mobile thrombus in his right leg vein.   This is associated with lots of stagnant flow. Echo shows normal LV systolic  function, Grade 1 diastolic dysfunction. The assessment of the right ventricle is very difficult. I do not think that there is RV strain per se. There is some early signs of cardiac tamponade and invagination of the RA.  There is increased  respiratory variablity of the MV inflow   I think our best option is to continue treatment of the DVT/pulmonary embolus for now. I would use IV heparin as you are doing. This can be turned off and reversed if needed. After 24-48 hours of treatment with heparin, I would suggest repeat echocardiogram and repeat venous duplex scan to see if there has been some improvement. If this thrombus has resolved greatly improved and then I think we could consider halting the heparin for 4 hours in order to do a pericardiocentesis at some point this week  He will need lifelong anticoagulation    I have spent a total of 60  minutes with patient reviewing hospital  notes , telemetry, EKGs, labs and examining patient as well as establishing an assessment and plan that was discussed with the patient. > 50% of time was spent in direct patient care.    Thayer Headings, Brooke Bonito., MD, [facility] 09/20/2016, 1:31 PM [address]  [place] Pager [phone]

## 2016-09-20 NOTE — Progress Notes (Addendum)
PROGRESS NOTE    Nicholas Hale  CVE:[id number] DOB: [birth year] DOA: 09/19/2016 PCP: [name], MD  Brief Narrative: Nicholas Hale is a 70 y.o. male with medical history significant of DVT/PE s/p EKOS (8 weeks post op, completed 1 yr Eliquid then d/c) hypertension, hyperlipidemia, AAA (stable, 3.7 cm by ultrasound on 01/10/15), large right renal cyst, arthritis in hip and  back, who presents with shortness of breath. Patient stated that he has been having shortness of breath for 3 days. CT scan showed several bilateral pulmonary emboli with right pleural effusion, moderate pericardial effusion.  Assessment & Plan:  1. Acute Bilateral PE -continue IV heparin, improved, hemodynamically stable and asymptomatic at this time -h/o EKOS followed by Eliquis 2years back for PE, stopped Eliquis 1 year ago. -saw hematologist in the past and tested negative for hypercoagulable disorders -check Duplex US  2. Mod Pericardial effusion on CTA -no dyspnea or hypotension at this time -continue IVF -2d ECHO today -Cards following  3. Renal cyst, right and Liver lesion: pt had hx of a large right renal cyst (12 cm by [place] on 01/10/15). He was found to have a 1.2 cm hypodensity over the periphery of the left lobe by CTA today.  Not sure if his large renal cyst could have compressed venous return, contributing to DVT formation. -FU MRI of abdomen ordered  4. HTN:  -not on medications at home. -continue IVF today  5. HLD:  -Continue Lipitor  DVT prophylaxis:IV Heparin Code Status:Full Code Family Communication:None at bedside Disposition Plan:Keep in SDU   Consultants:  Cards  Subjective: Feels ok, no dyspnea or chest pain  Objective: Vitals:   09/20/16 0011 09/20/16 0327 09/20/16 0644 09/20/16 0800  BP: 98/70 130/87 130/90 125/85  Pulse: (!) 105 (!) 102 (!) 102 95  Resp: (!) '21 12 19 18  '$ Temp: 98 F (36.7 C) 98.1 F (36.7 C) 98 F (36.7 C)   TempSrc: Oral Oral Oral     SpO2: 96% 94% 94% 97%  Weight:      Height:        Intake/Output Summary (Last 24 hours) at 09/20/16 1347 Last data filed at 09/20/16 0645  Gross per 24 hour  Intake              375 ml  Output              250 ml  Net              125 ml   Filed Weights   09/19/16 1555 09/19/16 2109  Weight: 90.7 kg (200 lb) 92 kg (202 lb 13.2 oz)    Examination:  General exam: Appears calm and comfortable  Respiratory system: Clear to auscultation. Respiratory effort normal. Cardiovascular system: S1 & S2 heard, RRR. No JVD, murmurs, rubs, gallops or clicks. No pedal edema. Gastrointestinal system: Abdomen is nondistended, soft and nontender. No organomegaly or masses felt. Normal bowel sounds heard. Central nervous system: Alert and oriented. No focal neurological deficits. Extremities: Symmetric 5 x 5 power. Skin: No rashes, lesions or ulcers Psychiatry: Judgement and insight appear normal. Mood & affect appropriate.     Data Reviewed: I have personally reviewed following labs and imaging studies  CBC:  Recent Labs Lab 09/19/16 1615 09/20/16 0533  WBC 8.9 8.8  HGB 16.4 14.9  HCT 49.5 44.7  MCV 95.7 94.7  PLT PLATELETS APPEAR ADEQUATE 025*   Basic Metabolic Panel:  Recent Labs Lab 09/19/16 1615 09/19/16 1930 09/20/16 0533  NA 139  --  138  K 4.0  --  4.1  CL 106  --  109  CO2 26  --  21*  GLUCOSE 106*  --  118*  BUN 16  --  14  CREATININE 1.14  --  1.08  CALCIUM 8.9  --  8.3*  MG  --  2.0  --    GFR: Estimated Creatinine Clearance: 73.8 mL/min (by C-G formula based on SCr of 1.08 mg/dL). Liver Function Tests: No results for input(s): AST, ALT, ALKPHOS, BILITOT, PROT, ALBUMIN in the last 168 hours. No results for input(s): LIPASE, AMYLASE in the last 168 hours. No results for input(s): AMMONIA in the last 168 hours. Coagulation Profile:  Recent Labs Lab 09/19/16 2331  INR 1.27   Cardiac Enzymes:  Recent Labs Lab 09/19/16 1615 09/19/16 2331  09/20/16 0533  TROPONINI 0.04* 0.03* <0.03   BNP (last 3 results) No results for input(s): PROBNP in the last 8760 hours. HbA1C: No results for input(s): HGBA1C in the last 72 hours. CBG:  Recent Labs Lab 09/20/16 0807  GLUCAP 126*   Lipid Profile:  Recent Labs  09/20/16 0533  CHOL 94  HDL 23*  LDLCALC 50  TRIG 106  CHOLHDL 4.1   Thyroid Function Tests:  Recent Labs  09/20/16 0533  TSH 3.478   Anemia Panel: No results for input(s): VITAMINB12, FOLATE, FERRITIN, TIBC, IRON, RETICCTPCT in the last 72 hours. Urine analysis:    Component Value Date/Time   COLORURINE YELLOW 05/06/2015 0935   APPEARANCEUR CLEAR 05/06/2015 0935   LABSPEC 1.003 (L) 05/06/2015 0935   PHURINE 6.0 05/06/2015 0935   GLUCOSEU NEGATIVE 05/06/2015 0935   HGBUR NEGATIVE 05/06/2015 0935   BILIRUBINUR NEGATIVE 05/06/2015 0935   KETONESUR NEGATIVE 05/06/2015 0935   PROTEINUR NEGATIVE 05/06/2015 0935   UROBILINOGEN 0.2 05/06/2015 0935   NITRITE NEGATIVE 05/06/2015 0935   LEUKOCYTESUR NEGATIVE 05/06/2015 0935   Sepsis Labs: '@LABRCNTIP'$ (procalcitonin:4,lacticidven:4)  ) Recent Results (from the past 240 hour(s))  MRSA PCR Screening     Status: None   Collection Time: 09/20/16  2:08 AM  Result Value Ref Range Status   MRSA by PCR NEGATIVE NEGATIVE Final    Comment:        The GeneXpert MRSA Assay (FDA approved for NASAL specimens only), is one component of a comprehensive MRSA colonization surveillance program. It is not intended to diagnose MRSA infection nor to guide or monitor treatment for MRSA infections.          Radiology Studies: Dg Chest 2 View  Result Date: 09/19/2016 CLINICAL DATA:  Shortness of breath with exertion EXAM: CHEST  2 VIEW COMPARISON:  05/06/2015 FINDINGS: Cardiac shadow is stable. The lungs are well aerated bilaterally. No focal infiltrate or sizable effusion is seen. Degenerative changes of the thoracic spine are noted. IMPRESSION: No active  cardiopulmonary disease. Electronically Signed   By: Inez Catalina M.D.   On: 09/19/2016 16:18   Ct Angio Chest Pe W And/or Wo Contrast  Addendum Date: 09/19/2016   ADDENDUM REPORT: 09/19/2016 18:49 ADDENDUM: Recommend followup CT chest 6 months to document stability of the 8 mm pleural-based nodule left lower lobe. Electronically Signed   By: Marin Olp M.D.   On: 09/19/2016 18:49   Result Date: 09/19/2016 CLINICAL DATA:  Shortness of breath.  History of PD 2015. EXAM: CT ANGIOGRAPHY CHEST WITH CONTRAST TECHNIQUE: Multidetector CT imaging of the chest was performed using the standard protocol during bolus administration of intravenous contrast. Multiplanar CT image  reconstructions and MIPs were obtained to evaluate the vascular anatomy. CONTRAST:  100 mL Isovue 370 IV. COMPARISON:  08/08/2014 and 08/05/2014 FINDINGS: Cardiovascular: Examination demonstrates the heart to be normal in size as there is a moderate size pericardial effusion. Thoracic aorta is otherwise within normal. Pulmonary arterial system demonstrates several tiny bilateral pulmonary emboli. RV/LV ratio is 0.99. Tiny focus of calcification along the left anterior descending coronary artery. Mediastinum/Nodes: There are a few shotty mediastinal lymph nodes. There is a 1 cm right hilar lymph node. Remaining mediastinal structures are unremarkable. Lungs/Pleura: Lungs are adequately inflated demonstrate mild paraseptal and centrilobular emphysematous change over the mid to upper lungs. 8 mm pleural based nodule opacity over the left lower lobe. Small right pleural effusion with associated atelectasis. Airways are within normal. Upper Abdomen: Partially visualized 3.4 cm cyst over the upper pole right kidney. 1.2 cm hypodensity along the periphery of the left lobe of the liver. Colonic diverticulosis. Musculoskeletal: Degenerative change of the spine. Review of the MIP images confirms the above findings. IMPRESSION: Several tiny bilateral  pulmonary emboli. Small associated right pleural effusion and right basilar atelectasis. RV/LV ratio 0.99 compatible with intermediate risk pulmonary embolism. The presence of right heart strain has been associated with an increased risk of morbidity and mortality. Moderate size pericardial effusion. Mild emphysematous disease. 1.2 cm hypodensity over the periphery of the left lobe of the liver not well seen previously. Consider MRI on elective basis for further evaluation. Right renal cyst. Critical Value/emergent results were called by telephone at the time of interpretation on 09/19/2016 at 5:42 pm to Dr. Davonna Belling , who verbally acknowledged these results. Electronically Signed: By: Marin Olp M.D. On: 09/19/2016 17:42   Mr Abdomen W Wo Contrast  Result Date: 09/20/2016 CLINICAL DATA:  70 year old male with history of right renal cyst and indeterminate lesion in the left lobe of the liver. Followup study. EXAM: MRI ABDOMEN WITHOUT AND WITH CONTRAST TECHNIQUE: Multiplanar multisequence MR imaging of the abdomen was performed both before and after the administration of intravenous contrast. CONTRAST:  64m MULTIHANCE GADOBENATE DIMEGLUMINE 529 MG/ML IV SOLN COMPARISON:  No prior abdominal MRI.  CT of the chest 09/19/2016. FINDINGS: Lower chest: Small right pleural effusion. Signal intensity in the dependent right lower lobe likely reflects areas of subsegmental atelectasis. Moderate pericardial effusion. Hepatobiliary: The lesion of concern in the periphery of segment 4A of the liver measures 13 mm in diameter and is T1 hypointense, T2 hyperintense, and does not enhance, compatible with a simple hepatic cyst. No suspicious hepatic lesions are noted. No intra or extrahepatic biliary ductal dilatation. Gallbladder is unremarkable in appearance. Pancreas: No pancreatic mass. No pancreatic ductal dilatation. No pancreatic or peripancreatic fluid or inflammatory changes. Spleen:  Unremarkable.  Adrenals/Urinary Tract: Within the kidneys bilaterally there are several T1 hypointense, T2 hyperintense, nonenhancing lesions, compatible with simple cysts. In addition, there is a very large exophytic lesion extending off the lower pole of the right kidney which measures up to 13.7 x 10.3 x 11.7 cm (axial image 36 of series 7 and coronal image 26 of series 4), which has 3 thin internal septations which do not demonstrate significant thickening or enhancement, and demonstrates no mural nodularity or other internal enhancement, compatible with a mildly complex cyst (Bosniak class 2). No other suspicious renal lesions are noted. No hydroureteronephrosis. Bilateral adrenal glands are normal in appearance. Stomach/Bowel: Visualized portions are unremarkable. Vascular/Lymphatic: Aortic atherosclerosis, with aneurysmal dilatation of the infrarenal abdominal aorta which measures up to 3.6 x  3.0 cm (image 108 of series 1101) where there is some eccentric nonenhancing material, compatible with a combination of atheromatous plaque and/or mural thrombus. No lymphadenopathy noted in the abdomen. Other: Perinephric stranding bilaterally (nonspecific). No significant volume of ascites. Musculoskeletal: No aggressive osseous lesions are noted in the visualized portions of the skeleton. IMPRESSION: 1. The lesion of concern in the liver is compatible with a small simple cyst. This is a benign finding. 2. 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 3. Infrarenal abdominal aortic aneurysm measuring up to 3.0 x 3.6 cm (mean diameter of 3.3 cm). Recommend followup by ultrasound in 3 years. This recommendation follows ACR consensus guidelines: White Paper of the ACR Incidental Findings Committee II on Vascular Findings. J Am Coll Radiol 2013; 10:789-794. 4.  Moderate pericardial effusion. 5. Small right pleural effusion with some passive atelectasis in the right lower lobe. Electronically Signed   By: Vinnie Langton M.D.   On: 09/20/2016 13:19        Scheduled Meds: . aspirin  81 mg Oral Daily  . atorvastatin  20 mg Oral q1800  . sodium chloride flush  3 mL Intravenous Q12H   Continuous Infusions: . sodium chloride    . heparin 1,500 Units/hr (09/20/16 0932)     LOS: 1 day    Time spent: 9mn    PDomenic Polite MD [facility] Pager [phone] If 7PM-7AM, please contact night-coverage [email or web address] Password [email or web address] 09/20/2016, 1:47 PM

## 2016-09-20 NOTE — Progress Notes (Signed)
  Echocardiogram 2D Echocardiogram has been performed.  Nicholas Hale 09/20/2016, 1:38 PM

## 2016-09-21 ENCOUNTER — Other Ambulatory Visit (HOSPITAL_COMMUNITY): Payer: Medicare Other

## 2016-09-21 ENCOUNTER — Inpatient Hospital Stay (HOSPITAL_COMMUNITY): Payer: Medicare Other

## 2016-09-21 ENCOUNTER — Encounter (HOSPITAL_COMMUNITY): Payer: Self-pay | Admitting: Interventional Cardiology

## 2016-09-21 ENCOUNTER — Encounter (HOSPITAL_COMMUNITY)
Admission: EM | Disposition: A | Discharge status: Home / Self Care | Payer: Self-pay | Source: Home / Self Care | Attending: Internal Medicine

## 2016-09-21 DIAGNOSIS — I314 Cardiac tamponade: Secondary | ICD-10-CM

## 2016-09-21 DIAGNOSIS — I319 Disease of pericardium, unspecified: Secondary | ICD-10-CM

## 2016-09-21 DIAGNOSIS — R748 Abnormal levels of other serum enzymes: Secondary | ICD-10-CM

## 2016-09-21 DIAGNOSIS — Z86718 Personal history of other venous thrombosis and embolism: Secondary | ICD-10-CM

## 2016-09-21 DIAGNOSIS — I824Y3 Acute embolism and thrombosis of unspecified deep veins of proximal lower extremity, bilateral: Secondary | ICD-10-CM

## 2016-09-21 HISTORY — PX: PERICARDIAL FLUID DRAINAGE: SHX5100

## 2016-09-21 HISTORY — PX: CARDIAC CATHETERIZATION: SHX172

## 2016-09-21 LAB — COMPREHENSIVE METABOLIC PANEL
ALK PHOS: 65 U/L (ref 38–126)
ALT: 69 U/L — AB (ref 17–63)
ANION GAP: 10 (ref 5–15)
AST: 39 U/L (ref 15–41)
Albumin: 3.3 g/dL — ABNORMAL LOW (ref 3.5–5.0)
BILIRUBIN TOTAL: 0.8 mg/dL (ref 0.3–1.2)
BUN: 18 mg/dL (ref 6–20)
CALCIUM: 8.5 mg/dL — AB (ref 8.9–10.3)
CO2: 20 mmol/L — AB (ref 22–32)
CREATININE: 1.03 mg/dL (ref 0.61–1.24)
Chloride: 107 mmol/L (ref 101–111)
GFR calc non Af Amer: >60 mL/min (ref >60)
GLUCOSE: 125 mg/dL — AB (ref 65–99)
Potassium: 4 mmol/L (ref 3.5–5.1)
SODIUM: 137 mmol/L (ref 135–145)
TOTAL PROTEIN: 6.3 g/dL — AB (ref 6.5–8.1)

## 2016-09-21 LAB — CBC
HEMATOCRIT: 45.8 % (ref 39.0–52.0)
HEMOGLOBIN: 15.3 g/dL (ref 13.0–17.0)
MCH: 31.7 pg (ref 26.0–34.0)
MCHC: 33.4 g/dL (ref 30.0–36.0)
MCV: 95 fL (ref 78.0–100.0)
Platelets: 116 10*3/uL — ABNORMAL LOW (ref 150–400)
RBC: 4.82 MIL/uL (ref 4.22–5.81)
RDW: 13 % (ref 11.5–15.5)
WBC: 10 10*3/uL (ref 4.0–10.5)

## 2016-09-21 LAB — SEDIMENTATION RATE: Sed Rate: 0 mm/hr (ref 0–16)

## 2016-09-21 LAB — C-REACTIVE PROTEIN: CRP: 2.5 mg/dL — ABNORMAL HIGH (ref <1.0)

## 2016-09-21 LAB — GLUCOSE, CAPILLARY: Glucose-Capillary: 119 mg/dL — ABNORMAL HIGH (ref 65–99)

## 2016-09-21 LAB — GRAM STAIN

## 2016-09-21 LAB — ECHOCARDIOGRAM LIMITED
HEIGHTINCHES: 71 in
Weight: 3245.17 oz

## 2016-09-21 LAB — BODY FLUID CELL COUNT WITH DIFFERENTIAL
Eos, Fluid: 0 %
Lymphs, Fluid: 63 %
Monocyte-Macrophage-Serous Fluid: 21 % — ABNORMAL LOW (ref 50–90)
Neutrophil Count, Fluid: 16 % (ref 0–25)
WBC FLUID: 793 uL (ref 0–1000)

## 2016-09-21 LAB — HEPARIN LEVEL (UNFRACTIONATED)
Heparin Unfractionated: 0.49 IU/mL (ref 0.30–0.70)
Heparin Unfractionated: 0.29 IU/mL — ABNORMAL LOW (ref 0.30–0.70)

## 2016-09-21 LAB — HEMOGLOBIN A1C
HEMOGLOBIN A1C: 5.8 % — AB (ref 4.8–5.6)
Mean Plasma Glucose: 120 mg/dL

## 2016-09-21 SURGERY — PERICARDIOCENTESIS
Anesthesia: LOCAL

## 2016-09-21 MED ORDER — ORAL CARE MOUTH RINSE
15.0000 mL | Freq: Two times a day (BID) | OROMUCOSAL | Status: DC
  Administered 2016-09-21: 15 mL via OROMUCOSAL

## 2016-09-21 MED ORDER — SODIUM CHLORIDE 0.9% FLUSH: 3.0000 mL | INTRAVENOUS | Status: DC | PRN

## 2016-09-21 MED ORDER — LIDOCAINE HCL (PF) 1 % IJ SOLN
INTRAMUSCULAR | Status: DC | PRN
Start: 2016-09-21 — End: 2016-09-21
  Administered 2016-09-21: 30 mL

## 2016-09-21 MED ORDER — MIDAZOLAM HCL 2 MG/2ML IJ SOLN
INTRAMUSCULAR | Status: DC | PRN
  Administered 2016-09-21: 2 mg via INTRAVENOUS

## 2016-09-21 MED ORDER — HEPARIN (PORCINE) IN NACL 100-0.45 UNIT/ML-% IJ SOLN
1300.0000 [IU]/h | INTRAMUSCULAR | Status: AC
  Administered 2016-09-21 – 2016-09-22 (×2): 1300 [IU]/h via INTRAVENOUS
  Filled 2016-09-21: qty 250

## 2016-09-21 MED ORDER — FENTANYL CITRATE (PF) 100 MCG/2ML IJ SOLN
INTRAMUSCULAR | Status: DC | PRN
  Administered 2016-09-21: 50 ug via INTRAVENOUS
  Administered 2016-09-21: 25 ug via INTRAVENOUS

## 2016-09-21 MED ORDER — HEPARIN (PORCINE) IN NACL 2-0.9 UNIT/ML-% IJ SOLN
INTRAMUSCULAR | Status: AC
Start: 2016-09-21 — End: 2016-09-21
  Filled 2016-09-21: qty 1000

## 2016-09-21 MED ORDER — SODIUM CHLORIDE 0.9% FLUSH
3.0000 mL | Freq: Two times a day (BID) | INTRAVENOUS | Status: DC
  Administered 2016-09-21 – 2016-09-22 (×3): 3 mL via INTRAVENOUS

## 2016-09-21 MED ORDER — SODIUM CHLORIDE 0.9 % IV SOLN: 250.0000 mL | INTRAVENOUS | Status: DC | PRN

## 2016-09-21 MED ORDER — FENTANYL CITRATE (PF) 100 MCG/2ML IJ SOLN
INTRAMUSCULAR | Status: AC
  Filled 2016-09-21: qty 2

## 2016-09-21 MED ORDER — COLCHICINE 0.6 MG PO TABS
0.6000 mg | ORAL_TABLET | Freq: Two times a day (BID) | ORAL | Status: DC
  Administered 2016-09-21 – 2016-09-23 (×5): 0.6 mg via ORAL
  Filled 2016-09-21 (×5): qty 1

## 2016-09-21 MED ORDER — LIDOCAINE HCL (PF) 1 % IJ SOLN
INTRAMUSCULAR | Status: AC
  Filled 2016-09-21: qty 30

## 2016-09-21 MED ORDER — MIDAZOLAM HCL 2 MG/2ML IJ SOLN
INTRAMUSCULAR | Status: AC
  Filled 2016-09-21: qty 2

## 2016-09-21 MED ORDER — SODIUM CHLORIDE 0.9% FLUSH
3.0000 mL | Freq: Two times a day (BID) | INTRAVENOUS | Status: DC
  Administered 2016-09-21 – 2016-09-22 (×3): 3 mL via INTRAVENOUS

## 2016-09-21 SURGICAL SUPPLY — 7 items
COVER PRB 48X5XTLSCP FOLD TPE (BAG) IMPLANT
COVER PROBE 5X48 (BAG) ×4
PACK CARDIAC CATHETERIZATION (CUSTOM PROCEDURE TRAY) ×1 IMPLANT
PERIVAC PERICARDIOCENTESIS 8.3 (TRAY / TRAY PROCEDURE) ×1 IMPLANT
TRANSDUCER W/STOPCOCK (MISCELLANEOUS) ×1 IMPLANT
TUBING ART PRESS 72  MALE/FEM (TUBING) ×1
TUBING ART PRESS 72 MALE/FEM (TUBING) IMPLANT

## 2016-09-21 NOTE — Progress Notes (Signed)
ANTICOAGULATION CONSULT NOTE - Follow-Up Consult  Pharmacy Consult for Heparin Indication: pulmonary embolus  No Known Allergies  Patient Measurements: Height: '5\' 11"'$  (180.3 cm) Weight: 203 lb 11.3 oz (92.4 kg) IBW/kg (Calculated) : 75.3 Heparin Dosing Weight: 90.7 kg  Vital Signs: Temp: 98.2 F (36.8 C) (10/23 1107) Temp Source: Oral (10/23 1107) BP: 135/87 (10/23 1230) Pulse Rate: 88 (10/23 1230)  Labs:  Recent Labs  09/19/16 1615 09/19/16 2331  09/20/16 0533 09/20/16 1441 09/20/16 2233 09/21/16 0246  HGB 16.4  --   --  14.9  --   --  15.3  HCT 49.5  --   --  44.7  --   --  45.8  PLT PLATELETS APPEAR ADEQUATE  --   --  108*  --   --  116*  LABPROT  --  16.0*  --   --   --   --   --   INR  --  1.27  --   --   --   --   --   HEPARINUNFRC  --   --   < >  --  0.82* 0.58 0.49  CREATININE 1.14  --   --  1.08  --   --  1.03  TROPONINI 0.04* 0.03*  --  <0.03 <0.03  --   --   < > = values in this interval not displayed.  Estimated Creatinine Clearance: 77.5 mL/min (by C-G formula based on SCr of 1.03 mg/dL).   Assessment: 70yo male presents to [facility] with SOB noted with PE (history of DVT/PE in 2015) on heparin. Heparin was stopped this am for pericardiocentesis. Spoke with Dr. Irish Lack and may resume 2-3 hrs post procedure (completed about 11pm). No heparin bolus -heparin was at goal on 1300 units/hr  Goal of Therapy:  Heparin level 0.3-0.7 units/ml Monitor platelets by anticoagulation protocol: Yes   Plan:  -Restart heparin at 1300 units/hr -Heparin level in 6 hours and daily wth CBC daily -Will follow plans for oral anticoagulation  Hildred Laser, Pharm D 09/21/2016 2:00 PM

## 2016-09-21 NOTE — Interval H&P Note (Signed)
History and Physical Interval Note:  09/21/2016 9:58 AM  Nicholas Hale  has presented today for pericardiocentesis, with the diagnosis of tamponade  The various methods of treatment have been discussed with the patient and family. After consideration of risks, benefits and other options for treatment, the patient has consented to  Procedure(s): Pericardiocentesis (N/A) as a surgical intervention .  The patient's history has been reviewed, patient examined, no change in status, stable for surgery.  I have reviewed the patient's chart and labs.  Questions were answered to the patient's satisfaction.     Harjit Leider

## 2016-09-21 NOTE — Progress Notes (Addendum)
**  Preliminary report by tech**  Bilateral lower extremity venous duplex complete. There is evidence of deep vein thrombosis involving the right and left lower extremities. There is acute, mobile thrombosis in the right soleal vein. There is also acute thrombosis in the right gastrocnemius vein and proximal to mid segments of the peroneal veins. The left peroneal veins also have acute thrombosis in the distal segments. There is no evidence of superficial vein thrombosis involving the right and left lower extremities.  There is no evidence of Baker's cysts bilaterally. Results were given to the patient's nurse, Janett Billow.  09/21/16 3:44 PM Nicholas Hale RVT

## 2016-09-21 NOTE — Progress Notes (Signed)
Patient Name: Nicholas Hale Date of Encounter: 09/21/2016  Primary Cardiologist: New- [facility] Problem List     Principal Problem:   Recurrent pulmonary embolism [facility]) Active Problems:   Hypertension   Hyperlipidemia   Primary osteoarthritis of right hip   Pericardial effusion   Ventricular tachycardia (HCC)   Renal cyst, right   Liver lesion   Nonsustained ventricular tachycardia (HCC)   Elevated troponin     Subjective   Having more dyspnea. Did not sleep well.   Inpatient Medications    Scheduled Meds: . aspirin  81 mg Oral Daily  . atorvastatin  20 mg Oral q1800  . sodium chloride flush  3 mL Intravenous Q12H   Continuous Infusions: . sodium chloride 1 mL (09/20/16 1412)  . heparin 1,300 Units/hr (09/20/16 1553)   PRN Meds: acetaminophen **OR** acetaminophen, albuterol, dextromethorphan-guaiFENesin, HYDROcodone-acetaminophen, morphine injection, ondansetron, zolpidem   Vital Signs    Vitals:   09/20/16 1954 09/20/16 2327 09/21/16 0351 09/21/16 0800  BP: 127/84 128/81 (!) 128/96 104/74  Pulse: 95 (!) 101 97 (!) 101  Resp: '19 17 19 20  '$ Temp: 98.8 F (37.1 C) 98 F (36.7 C) 98.1 F (36.7 C) 97.6 F (36.4 C)  TempSrc: Oral Oral Oral Oral  SpO2: 96% 95% 96% 95%  Weight:      Height:        Intake/Output Summary (Last 24 hours) at 09/21/16 0829 Last data filed at 09/21/16 0352  Gross per 24 hour  Intake          1018.77 ml  Output              525 ml  Net           493.77 ml   Filed Weights   09/19/16 1555 09/19/16 2109  Weight: 200 lb (90.7 kg) 202 lb 13.2 oz (92 kg)    Physical Exam    GEN: Well nourished, well developed, appears uncomfortable.  HEENT: Grossly normal.  Neck: Supple, JVD to jaw, carotid bruits, or masses. Cardiac: RRR, no murmurs, rubs, or gallops. No clubbing, cyanosis, edema.  Radials/DP/PT 2+ and equal bilaterally. Muffled heart sounds Respiratory:  Respirations regular and unlabored, clear to  auscultation bilaterally. GI: Soft, nontender, nondistended, BS + x 4. MS: no deformity or atrophy. Skin: warm and dry, no rash. Neuro:  Strength and sensation are intact. Psych: AAOx3.  Normal affect.  Labs    CBC  Recent Labs  09/20/16 0533 09/21/16 0246  WBC 8.8 10.0  HGB 14.9 15.3  HCT 44.7 45.8  MCV 94.7 95.0  PLT 108* 130*   Basic Metabolic Panel  Recent Labs  09/19/16 1930 09/20/16 0533 09/21/16 0246  NA  --  138 137  K  --  4.1 4.0  CL  --  109 107  CO2  --  21* 20*  GLUCOSE  --  118* 125*  BUN  --  14 18  CREATININE  --  1.08 1.03  CALCIUM  --  8.3* 8.5*  MG 2.0  --   --    Liver Function Tests  Recent Labs  09/21/16 0246  AST 39  ALT 69*  ALKPHOS 65  BILITOT 0.8  PROT 6.3*  ALBUMIN 3.3*   No results for input(s): LIPASE, AMYLASE in the last 72 hours. Cardiac Enzymes  Recent Labs  09/19/16 2331 09/20/16 0533 09/20/16 1441  TROPONINI 0.03* <0.03 <0.03   BNP Invalid input(s): POCBNP D-Dimer No results for input(s): DDIMER  in the last 72 hours. Hemoglobin A1C  Recent Labs  09/20/16 0533  HGBA1C 5.8*   Fasting Lipid Panel  Recent Labs  09/20/16 0533  CHOL 94  HDL 23*  LDLCALC 50  TRIG 106  CHOLHDL 4.1   Thyroid Function Tests  Recent Labs  09/20/16 0533  TSH 3.478    Telemetry    NSR with some NSVT  - Personally Reviewed  ECG      HR 94 Normal sinus rhythm Low voltage QRS   - Personally Reviewed  Radiology    Dg Chest 2 View  Result Date: 09/19/2016 CLINICAL DATA:  Shortness of breath with exertion EXAM: CHEST  2 VIEW COMPARISON:  05/06/2015 FINDINGS: Cardiac shadow is stable. The lungs are well aerated bilaterally. No focal infiltrate or sizable effusion is seen. Degenerative changes of the thoracic spine are noted. IMPRESSION: No active cardiopulmonary disease. Electronically Signed   By: Inez Catalina M.D.   On: 09/19/2016 16:18   Ct Angio Chest Pe W And/or Wo Contrast  Addendum Date: 09/19/2016     ADDENDUM REPORT: 09/19/2016 18:49 ADDENDUM: Recommend followup CT chest 6 months to document stability of the 8 mm pleural-based nodule left lower lobe. Electronically Signed   By: Marin Olp M.D.   On: 09/19/2016 18:49   Result Date: 09/19/2016 CLINICAL DATA:  Shortness of breath.  History of PD 2015. EXAM: CT ANGIOGRAPHY CHEST WITH CONTRAST TECHNIQUE: Multidetector CT imaging of the chest was performed using the standard protocol during bolus administration of intravenous contrast. Multiplanar CT image reconstructions and MIPs were obtained to evaluate the vascular anatomy. CONTRAST:  100 mL Isovue 370 IV. COMPARISON:  08/08/2014 and 08/05/2014 FINDINGS: Cardiovascular: Examination demonstrates the heart to be normal in size as there is a moderate size pericardial effusion. Thoracic aorta is otherwise within normal. Pulmonary arterial system demonstrates several tiny bilateral pulmonary emboli. RV/LV ratio is 0.99. Tiny focus of calcification along the left anterior descending coronary artery. Mediastinum/Nodes: There are a few shotty mediastinal lymph nodes. There is a 1 cm right hilar lymph node. Remaining mediastinal structures are unremarkable. Lungs/Pleura: Lungs are adequately inflated demonstrate mild paraseptal and centrilobular emphysematous change over the mid to upper lungs. 8 mm pleural based nodule opacity over the left lower lobe. Small right pleural effusion with associated atelectasis. Airways are within normal. Upper Abdomen: Partially visualized 3.4 cm cyst over the upper pole right kidney. 1.2 cm hypodensity along the periphery of the left lobe of the liver. Colonic diverticulosis. Musculoskeletal: Degenerative change of the spine. Review of the MIP images confirms the above findings. IMPRESSION: Several tiny bilateral pulmonary emboli. Small associated right pleural effusion and right basilar atelectasis. RV/LV ratio 0.99 compatible with intermediate risk pulmonary embolism. The  presence of right heart strain has been associated with an increased risk of morbidity and mortality. Moderate size pericardial effusion. Mild emphysematous disease. 1.2 cm hypodensity over the periphery of the left lobe of the liver not well seen previously. Consider MRI on elective basis for further evaluation. Right renal cyst. Critical Value/emergent results were called by telephone at the time of interpretation on 09/19/2016 at 5:42 pm to Dr. Davonna Belling , who verbally acknowledged these results. Electronically Signed: By: Marin Olp M.D. On: 09/19/2016 17:42   Mr Abdomen W Wo Contrast  Result Date: 09/20/2016 CLINICAL DATA:  70 year old male with history of right renal cyst and indeterminate lesion in the left lobe of the liver. Followup study. EXAM: MRI ABDOMEN WITHOUT AND WITH CONTRAST TECHNIQUE: Multiplanar multisequence  MR imaging of the abdomen was performed both before and after the administration of intravenous contrast. CONTRAST:  66m MULTIHANCE GADOBENATE DIMEGLUMINE 529 MG/ML IV SOLN COMPARISON:  No prior abdominal MRI.  CT of the chest 09/19/2016. FINDINGS: Lower chest: Small right pleural effusion. Signal intensity in the dependent right lower lobe likely reflects areas of subsegmental atelectasis. Moderate pericardial effusion. Hepatobiliary: The lesion of concern in the periphery of segment 4A of the liver measures 13 mm in diameter and is T1 hypointense, T2 hyperintense, and does not enhance, compatible with a simple hepatic cyst. No suspicious hepatic lesions are noted. No intra or extrahepatic biliary ductal dilatation. Gallbladder is unremarkable in appearance. Pancreas: No pancreatic mass. No pancreatic ductal dilatation. No pancreatic or peripancreatic fluid or inflammatory changes. Spleen:  Unremarkable. Adrenals/Urinary Tract: Within the kidneys bilaterally there are several T1 hypointense, T2 hyperintense, nonenhancing lesions, compatible with simple cysts. In addition,  there is a very large exophytic lesion extending off the lower pole of the right kidney which measures up to 13.7 x 10.3 x 11.7 cm (axial image 36 of series 7 and coronal image 26 of series 4), which has 3 thin internal septations which do not demonstrate significant thickening or enhancement, and demonstrates no mural nodularity or other internal enhancement, compatible with a mildly complex cyst (Bosniak class 2). No other suspicious renal lesions are noted. No hydroureteronephrosis. Bilateral adrenal glands are normal in appearance. Stomach/Bowel: Visualized portions are unremarkable. Vascular/Lymphatic: Aortic atherosclerosis, with aneurysmal dilatation of the infrarenal abdominal aorta which measures up to 3.6 x 3.0 cm (image 108 of series 1101) where there is some eccentric nonenhancing material, compatible with a combination of atheromatous plaque and/or mural thrombus. No lymphadenopathy noted in the abdomen. Other: Perinephric stranding bilaterally (nonspecific). No significant volume of ascites. Musculoskeletal: No aggressive osseous lesions are noted in the visualized portions of the skeleton. IMPRESSION: 1. The lesion of concern in the liver is compatible with a small simple cyst. This is a benign finding. 2. 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 3. Infrarenal abdominal aortic aneurysm measuring up to 3.0 x 3.6 cm (mean diameter of 3.3 cm). Recommend followup by ultrasound in 3 years. This recommendation follows ACR consensus guidelines: White Paper of the ACR Incidental Findings Committee II on Vascular Findings. J Am Coll Radiol 2013; 10:789-794. 4. Moderate pericardial effusion. 5. Small right pleural effusion with some passive atelectasis in the right lower lobe. Electronically Signed   By: DVinnie LangtonM.D.   On:  09/20/2016 13:19    Cardiac Studies   2D ECHO: 09/20/2016 LV EF: 60% -   65% Study Conclusions - Left ventricle: The cavity size was normal. Wall thickness was   normal. Systolic function was normal. The estimated ejection   fraction was in the range of 60% to 65%. Wall motion was normal;   there were no regional wall motion abnormalities. Doppler   parameters are consistent with abnormal left ventricular   relaxation (grade 1 diastolic dysfunction). - Pericardium, extracardiac: A large pericardial effusion was   identified circumferential to the heart. Impressions: - There is a large pericardial effusion with signs of early   tamponade   There is invagination of the RV and RA.   There is increased respiratory variability of the MV inflow.   The IVC is at the upper limits of normal  and does not collapse   with sniff.   The patient also has a pulmonary embolus and a mobile thrombus in   his right calf.   The plan is to treat with IV heparin overnight and recheck echo   and venous duplex tomorrow   I would prefer to reduce the thrombus burden before we hold the   heparin for pericardiocentesis.  Patient Profile     70 YO male with history of previous DVT/PE s/p EKOS (8 weeks post op, completed 1 yr Eliquis then d/c), AAA, HLD, HTN who presented to [facility]16 with 3 days of shortness of breath. He was found to have several bilateral pulmonary emboli with right pleural effusion, moderate pericardial effusion and NSVT and transferred [facility] stepdown on medicine service  Assessment & Plan    Recurrent PE: with evidence of right heart strain on CT scan and mildly elevated troponin (0.04). Started on heparin which was been continued for ~ 48 hours. He is now more dyspneic. PEs too small to be causing this degree of dyspnea per Dr. Titus Mould. Plan is to stop heparin gtt and proceed with pericardiocentesis. He will need lifelong anticoagulation  Large pericardial effusion: 2D ECHO  yesterday showed there is a large pericardial effusion with signs of early tamponade. Now much more dyspneic then yesterday. As above, holding IV heparin for pericardiocentesis.   HTN: fortunately BP has remained stable.  HLD: continue statin   NSVT: continue to monitor  Signed, Angelena Form, PA-C  09/21/2016, 8:29 AM  Patient seen and examined and history reviewed. Agree with above findings and plan. 70 yo WM admitted with acute SOB. Has a history of DVT/PE. S/p EKOS and on anticoagulation for one year. On admission CT shows bilateral small pulmonary emboli. Also found to have a large pericardial effusion. Overnight notes worsening SOB. He is more tachycardic. Discussed with pulmonary and it is felt that dyspnea is out of proportion to his small PEs. I personally reviewed Echo from yesterday. Large circumferential effusion with RA and RV collapse. IVC dilated. On exam he has JVD to jaw. Appears uncomfortable. I agree that cardiac tamponade is the primary issue now. He has been anticoagulated for 48 hours. Will stop heparin now and plan on pericardiocentesis this am. Etiology of pericardial effusion is unknown. Will need to send studies on fluid. If it is significantly hemorrhagic may need to hold heparin and consider an IVC filter. If clear would resume heparin later today. Discussed procedure at length with patient. Risks reviewed including risk of bleeding, arrhythmia, laceration of heart/liver/lung, pneumothorax. Patient understands and is willing to proceed.  Armine Rizzolo [place], [place] 09/21/2016 8:47 AM

## 2016-09-21 NOTE — Progress Notes (Signed)
ANTICOAGULATION CONSULT NOTE - Follow-Up Consult  Pharmacy Consult for Heparin Indication: pulmonary embolus  No Known Allergies  Patient Measurements: Height: '5\' 11"'$  (180.3 cm) Weight: 202 lb 13.2 oz (92 kg) IBW/kg (Calculated) : 75.3 Heparin Dosing Weight: 90.7 kg  Vital Signs: Temp: 98 F (36.7 C) (10/22 2327) Temp Source: Oral (10/22 2327) BP: 128/81 (10/22 2327) Pulse Rate: 101 (10/22 2327)  Labs:  Recent Labs  09/19/16 1615 09/19/16 2331 09/20/16 0155 09/20/16 0533 09/20/16 1441 09/20/16 2233  HGB 16.4  --   --  14.9  --   --   HCT 49.5  --   --  44.7  --   --   PLT PLATELETS APPEAR ADEQUATE  --   --  108*  --   --   LABPROT  --  16.0*  --   --   --   --   INR  --  1.27  --   --   --   --   HEPARINUNFRC  --   --  0.60  --  0.82* 0.58  CREATININE 1.14  --   --  1.08  --   --   TROPONINI 0.04* 0.03*  --  <0.03 <0.03  --     Estimated Creatinine Clearance: 73.8 mL/min (by C-G formula based on SCr of 1.08 mg/dL).   Medical History: Past Medical History:  Diagnosis Date  . Abdominal aortic aneurysm ([place])   . Arthritis    "hips; lower spine" (05/24/2014)  . Hyperlipidemia   . Hypertension   . PE (pulmonary embolism) 9/15  . 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 aspirin 81 MG tablet Take 81 mg by mouth daily.   09/19/2016  . atorvastatin (LIPITOR) 40 MG tablet Take 40 mg by mouth daily.   09/18/2016   Scheduled:  Infusions:   Assessment: 70yo male [facility] with SOB. Pharmacy is consulted to dose heparin for PE.  Heparin is now therapeutic on 1300 units/hr.     Goal of Therapy:  Heparin level 0.3-0.7 units/ml Monitor platelets by anticoagulation protocol: Yes   Plan:  Continue Heparin 1300 units/hr IV infusion Daily heparin level and CBCs while on heparin   [employer], Pharm.D., BCPS Clinical Pharmacist Pager  [phone] 09/21/2016 12:29 AM

## 2016-09-21 NOTE — Progress Notes (Addendum)
PROGRESS NOTE    Nicholas Hale  NTI:[id number] DOB: 1945/12/01 DOA: 09/19/2016 PCP: [name], MD  Brief Narrative: Nicholas Hale is a 70 y.o. 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 Patient stated that he has been having shortness of breath for 3 days. CT scan showed several bilateral pulmonary emboli with right pleural effusion, moderate pericardial effusion. ECHO 10/22: with large effusion and early tamponade, Cards following Assessment & Plan:  1. Acute Bilateral PE and DVT -ON Day 2 of IV heparin, remains hemodynamically stable more dyspneic since last pm-suspect this is related to his large pericardial effusion -repeat STAT ECHO -h/o EKOS followed by Eliquis 2years back for PE, stopped Eliquis 1 year ago. -saw hematologist in the past and tested negative for hypercoagulable disorders -will need Pericardiocentesis today after holding IV heparin, also check STAT Duplex-due to mobile DVT noted on duplex yesterday, will d/w Cards this am -also consulted PCCM-due to risk of hemodynamic decline-may need ICU transfer  2. Large Pericardial effusion on CTA -early tamponade physiology on ECHO noted yesterday afternoon -no hypotension at this time, however more dyspneic today -will need urgent Pericardiocentesis soon-will d/w Card this am -repeat 2d ECHO today -Cards following  3. Renal cyst, right and Liver lesion: pt had hx of a large right renal cyst (12 cm by [place] on 01/10/15). He was found to have a 1.2 cm hypodensity over the periphery of the left lobe by CTA today.  Not sure if his large renal cyst could have compressed venous return, contributing to DVT formation. -MRI notes liver and renal cysts  4. HTN:  -not on medications at home. -continue IVF  today  5. HLD:  -Continue Lipitor  DVT prophylaxis:IV Heparin Code Status:Full Code Family Communication:None at bedside Disposition Plan:may need ICU transfer   Consultants:  Cards  Subjective: Feels very short of breath today even laying in bed  Objective: Vitals:   09/20/16 1954 09/20/16 2327 09/21/16 0351 09/21/16 0800  BP: 127/84 128/81 (!) 128/96 104/74  Pulse: 95 (!) 101 97 (!) 101  Resp: '19 17 19 20  '$ Temp: 98.8 F (37.1 C) 98 F (36.7 C) 98.1 F (36.7 C) 97.6 F (36.4 C)  TempSrc: Oral Oral Oral Oral  SpO2: 96% 95% 96% 95%  Weight:      Height:        Intake/Output Summary (Last 24 hours) at 09/21/16 0804 Last data filed at 09/21/16 0352  Gross per 24 hour  Intake          1018.77 ml  Output              525 ml  Net           493.77 ml   Filed Weights   09/19/16 1555 09/19/16 2109  Weight: 90.7 kg (200 lb) 92 kg (202 lb 13.2 oz)    Examination:  General exam:very dyspneic, mod distress Respiratory system: decreased BS at bases Cardiovascular system: S1 & S2 heard, RRR. No JVD, murmurs, rubs, gallops or clicks. No pedal edema. Gastrointestinal system: Abdomen is nondistended, soft and nontender. No organomegaly or masses felt. Normal bowel sounds heard. Central nervous system: Alert and oriented. No focal neurological deficits. Extremities: Symmetric 5 x 5 power. Skin: No rashes, lesions or ulcers Psychiatry: Judgement and insight  appear normal. Mood & affect appropriate.     Data Reviewed: I have personally reviewed following labs and imaging studies  CBC:  Recent Labs Lab 09/19/16 1615 09/20/16 0533 09/21/16 0246  WBC 8.9 8.8 10.0  HGB 16.4 14.9 15.3  HCT 49.5 44.7 45.8  MCV 95.7 94.7 95.0  PLT PLATELETS APPEAR ADEQUATE 108* 967*   Basic Metabolic Panel:  Recent Labs Lab 09/19/16 1615 09/19/16 1930 09/20/16 0533 09/21/16 0246  NA 139  --  138 137  K 4.0  --  4.1 4.0  CL 106  --  109 107  CO2 26  --  21* 20*  GLUCOSE 106*   --  118* 125*  BUN 16  --  14 18  CREATININE 1.14  --  1.08 1.03  CALCIUM 8.9  --  8.3* 8.5*  MG  --  2.0  --   --    GFR: Estimated Creatinine Clearance: 77.4 mL/min (by C-G formula based on SCr of 1.03 mg/dL). Liver Function Tests:  Recent Labs Lab 09/21/16 0246  AST 39  ALT 69*  ALKPHOS 65  BILITOT 0.8  PROT 6.3*  ALBUMIN 3.3*   No results for input(s): LIPASE, AMYLASE in the last 168 hours. No results for input(s): AMMONIA in the last 168 hours. Coagulation Profile:  Recent Labs Lab 09/19/16 2331  INR 1.27   Cardiac Enzymes:  Recent Labs Lab 09/19/16 1615 09/19/16 2331 09/20/16 0533 09/20/16 1441  TROPONINI 0.04* 0.03* <0.03 <0.03   BNP (last 3 results) No results for input(s): PROBNP in the last 8760 hours. HbA1C:  Recent Labs  09/20/16 0533  HGBA1C 5.8*   CBG:  Recent Labs Lab 09/20/16 0807  GLUCAP 126*   Lipid Profile:  Recent Labs  09/20/16 0533  CHOL 94  HDL 23*  LDLCALC 50  TRIG 106  CHOLHDL 4.1   Thyroid Function Tests:  Recent Labs  09/20/16 0533  TSH 3.478   Anemia Panel: No results for input(s): VITAMINB12, FOLATE, FERRITIN, TIBC, IRON, RETICCTPCT in the last 72 hours. Urine analysis:    Component Value Date/Time   COLORURINE YELLOW 05/06/2015 0935   APPEARANCEUR CLEAR 05/06/2015 0935   LABSPEC 1.003 (L) 05/06/2015 0935   PHURINE 6.0 05/06/2015 0935   GLUCOSEU NEGATIVE 05/06/2015 0935   HGBUR NEGATIVE 05/06/2015 0935   BILIRUBINUR NEGATIVE 05/06/2015 0935   KETONESUR NEGATIVE 05/06/2015 0935   PROTEINUR NEGATIVE 05/06/2015 0935   UROBILINOGEN 0.2 05/06/2015 0935   NITRITE NEGATIVE 05/06/2015 0935   LEUKOCYTESUR NEGATIVE 05/06/2015 0935   Sepsis Labs: '@LABRCNTIP'$ (procalcitonin:4,lacticidven:4)  ) Recent Results (from the past 240 hour(s))  MRSA PCR Screening     Status: None   Collection Time: 09/20/16  2:08 AM  Result Value Ref Range Status   MRSA by PCR NEGATIVE NEGATIVE Final    Comment:        The  GeneXpert MRSA Assay (FDA approved for NASAL specimens only), is one component of a comprehensive MRSA colonization surveillance program. It is not intended to diagnose MRSA infection nor to guide or monitor treatment for MRSA infections.          Radiology Studies: Dg Chest 2 View  Result Date: 09/19/2016 CLINICAL DATA:  Shortness of breath with exertion EXAM: CHEST  2 VIEW COMPARISON:  05/06/2015 FINDINGS: Cardiac shadow is stable. The lungs are well aerated bilaterally. No focal infiltrate or sizable effusion is seen. Degenerative changes of the thoracic spine are noted. IMPRESSION: No active cardiopulmonary disease. Electronically Signed   By: Elta [place]  Lukens M.D.   On: 09/19/2016 16:18   Ct Angio Chest Pe W And/or Wo Contrast  Addendum Date: 09/19/2016   ADDENDUM REPORT: 09/19/2016 18:49 ADDENDUM: Recommend followup CT chest 6 months to document stability of the 8 mm pleural-based nodule left lower lobe. Electronically Signed   By: Marin Olp M.D.   On: 09/19/2016 18:49   Result Date: 09/19/2016 CLINICAL DATA:  Shortness of breath.  History of PD 2015. EXAM: CT ANGIOGRAPHY CHEST WITH CONTRAST TECHNIQUE: Multidetector CT imaging of the chest was performed using the standard protocol during bolus administration of intravenous contrast. Multiplanar CT image reconstructions and MIPs were obtained to evaluate the vascular anatomy. CONTRAST:  100 mL Isovue 370 IV. COMPARISON:  08/08/2014 and 08/05/2014 FINDINGS: Cardiovascular: Examination demonstrates the heart to be normal in size as there is a moderate size pericardial effusion. Thoracic aorta is otherwise within normal. Pulmonary arterial system demonstrates several tiny bilateral pulmonary emboli. RV/LV ratio is 0.99. Tiny focus of calcification along the left anterior descending coronary artery. Mediastinum/Nodes: There are a few shotty mediastinal lymph nodes. There is a 1 cm right hilar lymph node. Remaining mediastinal structures  are unremarkable. Lungs/Pleura: Lungs are adequately inflated demonstrate mild paraseptal and centrilobular emphysematous change over the mid to upper lungs. 8 mm pleural based nodule opacity over the left lower lobe. Small right pleural effusion with associated atelectasis. Airways are within normal. Upper Abdomen: Partially visualized 3.4 cm cyst over the upper pole right kidney. 1.2 cm hypodensity along the periphery of the left lobe of the liver. Colonic diverticulosis. Musculoskeletal: Degenerative change of the spine. Review of the MIP images confirms the above findings. IMPRESSION: Several tiny bilateral pulmonary emboli. Small associated right pleural effusion and right basilar atelectasis. RV/LV ratio 0.99 compatible with intermediate risk pulmonary embolism. The presence of right heart strain has been associated with an increased risk of morbidity and mortality. Moderate size pericardial effusion. Mild emphysematous disease. 1.2 cm hypodensity over the periphery of the left lobe of the liver not well seen previously. Consider MRI on elective basis for further evaluation. Right renal cyst. Critical Value/emergent results were called by telephone at the time of interpretation on 09/19/2016 at 5:42 pm to Dr. Davonna Belling , who verbally acknowledged these results. Electronically Signed: By: Marin Olp M.D. On: 09/19/2016 17:42   Mr Abdomen W Wo Contrast  Result Date: 09/20/2016 CLINICAL DATA:  70 year old male with history of right renal cyst and indeterminate lesion in the left lobe of the liver. Followup study. EXAM: MRI ABDOMEN WITHOUT AND WITH CONTRAST TECHNIQUE: Multiplanar multisequence MR imaging of the abdomen was performed both before and after the administration of intravenous contrast. CONTRAST:  51m MULTIHANCE GADOBENATE DIMEGLUMINE 529 MG/ML IV SOLN COMPARISON:  No prior abdominal MRI.  CT of the chest 09/19/2016. FINDINGS: Lower chest: Small right pleural effusion. Signal intensity  in the dependent right lower lobe likely reflects areas of subsegmental atelectasis. Moderate pericardial effusion. Hepatobiliary: The lesion of concern in the periphery of segment 4A of the liver measures 13 mm in diameter and is T1 hypointense, T2 hyperintense, and does not enhance, compatible with a simple hepatic cyst. No suspicious hepatic lesions are noted. No intra or extrahepatic biliary ductal dilatation. Gallbladder is unremarkable in appearance. Pancreas: No pancreatic mass. No pancreatic ductal dilatation. No pancreatic or peripancreatic fluid or inflammatory changes. Spleen:  Unremarkable. Adrenals/Urinary Tract: Within the kidneys bilaterally there are several T1 hypointense, T2 hyperintense, nonenhancing lesions, compatible with simple cysts. In addition, there is a very large  exophytic lesion extending off the lower pole of the right kidney which measures up to 13.7 x 10.3 x 11.7 cm (axial image 36 of series 7 and coronal image 26 of series 4), which has 3 thin internal septations which do not demonstrate significant thickening or enhancement, and demonstrates no mural nodularity or other internal enhancement, compatible with a mildly complex cyst (Bosniak class 2). No other suspicious renal lesions are noted. No hydroureteronephrosis. Bilateral adrenal glands are normal in appearance. Stomach/Bowel: Visualized portions are unremarkable. Vascular/Lymphatic: Aortic atherosclerosis, with aneurysmal dilatation of the infrarenal abdominal aorta which measures up to 3.6 x 3.0 cm (image 108 of series 1101) where there is some eccentric nonenhancing material, compatible with a combination of atheromatous plaque and/or mural thrombus. No lymphadenopathy noted in the abdomen. Other: Perinephric stranding bilaterally (nonspecific). No significant volume of ascites. Musculoskeletal: No aggressive osseous lesions are noted in the visualized portions of the skeleton. IMPRESSION: 1. The lesion of concern in the  liver is compatible with a small simple cyst. This is a benign finding. 2. 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 3. Infrarenal abdominal aortic aneurysm measuring up to 3.0 x 3.6 cm (mean diameter of 3.3 cm). Recommend followup by ultrasound in 3 years. This recommendation follows ACR consensus guidelines: White Paper of the ACR Incidental Findings Committee II on Vascular Findings. J Am Coll Radiol 2013; 10:789-794. 4. Moderate pericardial effusion. 5. Small right pleural effusion with some passive atelectasis in the right lower lobe. Electronically Signed   By: Vinnie Langton M.D.   On: 09/20/2016 13:19        Scheduled Meds: . aspirin  81 mg Oral Daily  . atorvastatin  20 mg Oral q1800  . sodium chloride flush  3 mL Intravenous Q12H   Continuous Infusions: . sodium chloride 1 mL (09/20/16 1412)  . heparin 1,300 Units/hr (09/20/16 1553)     LOS: 2 days    Time spent: 58mn    PDomenic Polite MD [facility] Pager [phone] If 7PM-7AM, please contact night-coverage [email or web address] Password [email or web address] 09/21/2016, 8:04 AM

## 2016-09-21 NOTE — Progress Notes (Signed)
Heparin gtt cut off at 0830am for procedure.

## 2016-09-21 NOTE — Consult Note (Signed)
Name: Nicholas Hale MRN: [id number] DOB: 1945-12-02    ADMISSION DATE:  09/19/2016 CONSULTATION DATE:  10/23  REFERRING MD :  Nicholas Hale   CHIEF COMPLAINT:  Dyspnea, PE, pericardial effusion   BRIEF PATIENT DESCRIPTION: 70yo male former smoker with hx HTN, previous PE/DVT in 2015 s/p EKOS (completed 1 year eliquis), AAA presented to outside facility with 3 days SOB.  CT chest revealed small bilateral PE and moderate pericardial effusion.  He was tx to [facility] for further treatment. He was started on heparin gtt and admitted to SDU by hospitalist and seen in consultation by cardiology r/t pericardial effusion and mildly elevated troponin (0.04).   On 10/23 he had progressive SOB without hypoxia and PCCM consulted to assist.    SIGNIFICANT EVENTS    STUDIES:  CTA chest 10/21>>> Several tiny bilateral pulmonary emboli. Small associated right pleural effusion and right basilar atelectasis. RV/LV ratio 0.99 compatible with intermediate risk pulmonary embolism.   Moderate size pericardial effusion.   Mild emphysematous disease. 8 mm pleural based nodule opacity over the left lower lobe. Small right pleural effusion with associated atelectasis. BLE venous dopplers 10/22>>> There is acute, mobile thrombus noted in the right soleal vein.  Acute DVT noted in the right gastrocnemius and peroneal veins and acute DVT noted in the left peroneal vein.     HISTORY OF PRESENT ILLNESS:  69yo male former smoker with hx HTN, previous PE/DVT in 2015 s/p EKOS (completed 1 year eliquis), AAA presented to outside facility with 3 days SOB.  CT chest revealed small bilateral PE and moderate pericardial effusion.  He was tx to [facility] for further treatment. He was started on heparin gtt and admitted to SDU by hospitalist and seen in consultation by cardiology r/t pericardial effusion and mildly elevated troponin (0.04).   On 10/23 he had progressive SOB without hypoxia and PCCM consulted to assist.    Pt c/o  more SOB overnight, intermittent nonproductive cough.   Denies chest pain, hemoptysis, lightheadedness.    PAST MEDICAL HISTORY :   has a past medical history of Abdominal aortic aneurysm ([place]); Arthritis; Hyperlipidemia; Hypertension; PE (pulmonary embolism) (9/15); and Personal history of DVT (deep vein thrombosis) (9/15).  has a past surgical history that includes Hemiarthroplasty shoulder fracture (Left, 12/2008); Appendectomy (1978); Anterior fusion cervical spine (~ 2005); Colonoscopy; Total hip arthroplasty (Left, 05/23/2014); Joint replacement; Inguinal hernia repair (Bilateral, 1992); and Total hip arthroplasty (Right, 05/15/2015). Prior to Admission medications   Medication Sig Start Date End Date Taking? Authorizing Provider  aspirin 81 MG tablet Take 81 mg by mouth daily.   Yes Historical Provider, MD  atorvastatin (LIPITOR) 40 MG tablet Take 40 mg by mouth daily. 09/14/16  Yes Historical Provider, MD   No Known Allergies  FAMILY HISTORY:  family history includes Clotting disorder in his mother; Diabetes Mellitus I in his mother; Prostate cancer in his father. SOCIAL HISTORY:  reports that he quit smoking about 7 years ago. His smoking use included Cigarettes. He has a 20.00 pack-year smoking history. He has never used smokeless tobacco. He reports that he does not drink alcohol or use drugs.  REVIEW OF SYSTEMS:   As per HPI - All other systems reviewed and were neg.    SUBJECTIVE:   VITAL SIGNS: Temp:  [97.6 F (36.4 C)-98.8 F (37.1 C)] 97.6 F (36.4 C) (10/23 0800) Pulse Rate:  [94-101] 101 (10/23 0800) Resp:  [17-20] 20 (10/23 0800) BP: (104-128)/(72-96) 104/74 (10/23 0800) SpO2:  [95 %-96 %]  95 % (10/23 0800) - ON RA  PHYSICAL EXAMINATION: General:  Pleasant male, NAD  Neuro:  Awake, alert, appropriate, MAE  HEENT:  Mm moist, no JVD Cardiovascular:  s1s1 rrr, sinus tach  Lungs:  resps even non labored on RA, diminished bases R>L  Abdomen:  Round, soft, non tender   Musculoskeletal:  Warm and dry, no sig edema    Recent Labs Lab 09/19/16 1615 09/20/16 0533 09/21/16 0246  NA 139 138 137  K 4.0 4.1 4.0  CL 106 109 107  CO2 26 21* 20*  BUN '16 14 18  '$ CREATININE 1.14 1.08 1.03  GLUCOSE 106* 118* 125*    Recent Labs Lab 09/19/16 1615 09/20/16 0533 09/21/16 0246  HGB 16.4 14.9 15.3  HCT 49.5 44.7 45.8  WBC 8.9 8.8 10.0  PLT PLATELETS APPEAR ADEQUATE 108* 116*   Dg Chest 2 View  Result Date: 09/19/2016 CLINICAL DATA:  Shortness of breath with exertion EXAM: CHEST  2 VIEW COMPARISON:  05/06/2015 FINDINGS: Cardiac shadow is stable. The lungs are well aerated bilaterally. No focal infiltrate or sizable effusion is seen. Degenerative changes of the thoracic spine are noted. IMPRESSION: No active cardiopulmonary disease. Electronically Signed   By: Nicholas Hale M.D.   On: 09/19/2016 16:18   Ct Angio Chest Pe W And/or Wo Contrast  Addendum Date: 09/19/2016   ADDENDUM REPORT: 09/19/2016 18:49 ADDENDUM: Recommend followup CT chest 6 months to document stability of the 8 mm pleural-based nodule left lower lobe. Electronically Signed   By: Nicholas Hale M.D.   On: 09/19/2016 18:49   Result Date: 09/19/2016 CLINICAL DATA:  Shortness of breath.  History of PD 2015. EXAM: CT ANGIOGRAPHY CHEST WITH CONTRAST TECHNIQUE: Multidetector CT imaging of the chest was performed using the standard protocol during bolus administration of intravenous contrast. Multiplanar CT image reconstructions and MIPs were obtained to evaluate the vascular anatomy. CONTRAST:  100 mL Isovue 370 IV. COMPARISON:  08/08/2014 and 08/05/2014 FINDINGS: Cardiovascular: Examination demonstrates the heart to be normal in size as there is a moderate size pericardial effusion. Thoracic aorta is otherwise within normal. Pulmonary arterial system demonstrates several tiny bilateral pulmonary emboli. RV/LV ratio is 0.99. Tiny focus of calcification along the left anterior descending coronary artery.  Mediastinum/Nodes: There are a few shotty mediastinal lymph nodes. There is a 1 cm right hilar lymph node. Remaining mediastinal structures are unremarkable. Lungs/Pleura: Lungs are adequately inflated demonstrate mild paraseptal and centrilobular emphysematous change over the mid to upper lungs. 8 mm pleural based nodule opacity over the left lower lobe. Small right pleural effusion with associated atelectasis. Airways are within normal. Upper Abdomen: Partially visualized 3.4 cm cyst over the upper pole right kidney. 1.2 cm hypodensity along the periphery of the left lobe of the liver. Colonic diverticulosis. Musculoskeletal: Degenerative change of the spine. Review of the MIP images confirms the above findings. IMPRESSION: Several tiny bilateral pulmonary emboli. Small associated right pleural effusion and right basilar atelectasis. RV/LV ratio 0.99 compatible with intermediate risk pulmonary embolism. The presence of right heart strain has been associated with an increased risk of morbidity and mortality. Moderate size pericardial effusion. Mild emphysematous disease. 1.2 cm hypodensity over the periphery of the left lobe of the liver not well seen previously. Consider MRI on elective basis for further evaluation. Right renal cyst. Critical Value/emergent results were called by telephone at the time of interpretation on 09/19/2016 at 5:42 pm to Dr. Davonna Belling , who verbally acknowledged these results. Electronically Signed: By: Quillian Quince  Derrel Nip M.D. On: 09/19/2016 17:42   Mr Abdomen W Wo Contrast  Result Date: 09/20/2016 CLINICAL DATA:  70 year old male with history of right renal cyst and indeterminate lesion in the left lobe of the liver. Followup study. EXAM: MRI ABDOMEN WITHOUT AND WITH CONTRAST TECHNIQUE: Multiplanar multisequence MR imaging of the abdomen was performed both before and after the administration of intravenous contrast. CONTRAST:  48m MULTIHANCE GADOBENATE DIMEGLUMINE 529 MG/ML IV  SOLN COMPARISON:  No prior abdominal MRI.  CT of the chest 09/19/2016. FINDINGS: Lower chest: Small right pleural effusion. Signal intensity in the dependent right lower lobe likely reflects areas of subsegmental atelectasis. Moderate pericardial effusion. Hepatobiliary: The lesion of concern in the periphery of segment 4A of the liver measures 13 mm in diameter and is T1 hypointense, T2 hyperintense, and does not enhance, compatible with a simple hepatic cyst. No suspicious hepatic lesions are noted. No intra or extrahepatic biliary ductal dilatation. Gallbladder is unremarkable in appearance. Pancreas: No pancreatic mass. No pancreatic ductal dilatation. No pancreatic or peripancreatic fluid or inflammatory changes. Spleen:  Unremarkable. Adrenals/Urinary Tract: Within the kidneys bilaterally there are several T1 hypointense, T2 hyperintense, nonenhancing lesions, compatible with simple cysts. In addition, there is a very large exophytic lesion extending off the lower pole of the right kidney which measures up to 13.7 x 10.3 x 11.7 cm (axial image 36 of series 7 and coronal image 26 of series 4), which has 3 thin internal septations which do not demonstrate significant thickening or enhancement, and demonstrates no mural nodularity or other internal enhancement, compatible with a mildly complex cyst (Bosniak class 2). No other suspicious renal lesions are noted. No hydroureteronephrosis. Bilateral adrenal glands are normal in appearance. Stomach/Bowel: Visualized portions are unremarkable. Vascular/Lymphatic: Aortic atherosclerosis, with aneurysmal dilatation of the infrarenal abdominal aorta which measures up to 3.6 x 3.0 cm (image 108 of series 1101) where there is some eccentric nonenhancing material, compatible with a combination of atheromatous plaque and/or mural thrombus. No lymphadenopathy noted in the abdomen. Other: Perinephric stranding bilaterally (nonspecific). No significant volume of ascites.  Musculoskeletal: No aggressive osseous lesions are noted in the visualized portions of the skeleton. IMPRESSION: 1. The lesion of concern in the liver is compatible with a small simple cyst. This is a benign finding. 2. 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 3. Infrarenal abdominal aortic aneurysm measuring up to 3.0 x 3.6 cm (mean diameter of 3.3 cm). Recommend followup by ultrasound in 3 years. This recommendation follows ACR consensus guidelines: White Paper of the ACR Incidental Findings Committee II on Vascular Findings. J Am Coll Radiol 2013; 10:789-794. 4. Moderate pericardial effusion. 5. Small right pleural effusion with some passive atelectasis in the right lower lobe. Electronically Signed   By: DVinnie LangtonM.D.   On: 09/20/2016 13:19    ASSESSMENT / PLAN:  Dyspnea - suspect dyspnea r/t pericardial effusion more than PE.  PE's are bilateral but small, pt not hypoxic.   PE/DVT  Pericardial effusion - large  Suspect COPD   PLAN -  Cardiology at bedside - f/u echo being performed.  Plan for pericardiocentesis ASAP - heparin gtt held  Resume heparin post procedure as long as pericardial effusion not heme  If bloody effusion - would need retrievable filter x4-6 weeks then resume anticoagulation  Will need lifelong anticoagulation  PRN BD  PCCM will f/u post procedure     Nickolas Madrid, NP 09/21/2016  9:10 AM Pager: [phone] or [phone]    STAFF NOTE: I, Merrie Roof, MD FACP have personally reviewed patient's available data, including medical history, events of note, physical examination and test results as part of my evaluation. I have discussed with resident/NP and other care providers such as pharmacist, RN and RRT. In addition, I personally evaluated patient and elicited key  findings of: awake, mild distress, not tachy, lungs clear to bases, jvd present, his PE are small and very distal and DO NOT account for his SOB  Now, likely is pericardial effusion with early tamponade causing SOB , recommend drainage, cards at bedside, heparin hold, if effusion bloody will  Need filter likely temp preferred, will need ICU monitoring after effusion drainage, post drainage will be ab le to determine if ab le to use heparin drip, follow cbc closely, I updated the pt in full at bedside, lytics would harm andpossib ly cause death if effusion b loody as well, he is NOT a candidate for ekos or lytics now   Lavon Paganini. Titus Mould, MD, [place] Pgr: [facility] 09/21/2016 10:18 AM

## 2016-09-21 NOTE — Progress Notes (Signed)
ANTICOAGULATION CONSULT NOTE - Follow-Up Consult  Pharmacy Consult for Heparin Indication: pulmonary embolus  No Known Allergies  Patient Measurements: Height: '5\' 11"'$  (180.3 cm) Weight: 202 lb 13.2 oz (92 kg) IBW/kg (Calculated) : 75.3 Heparin Dosing Weight: 90.7 kg  Vital Signs: Temp: 97.6 F (36.4 C) (10/23 0800) Temp Source: Oral (10/23 0800) BP: 104/74 (10/23 0800) Pulse Rate: 101 (10/23 0800)  Labs:  Recent Labs  09/19/16 1615 09/19/16 2331  09/20/16 0533 09/20/16 1441 09/20/16 2233 09/21/16 0246  HGB 16.4  --   --  14.9  --   --  15.3  HCT 49.5  --   --  44.7  --   --  45.8  PLT PLATELETS APPEAR ADEQUATE  --   --  108*  --   --  116*  LABPROT  --  16.0*  --   --   --   --   --   INR  --  1.27  --   --   --   --   --   HEPARINUNFRC  --   --   < >  --  0.82* 0.58 0.49  CREATININE 1.14  --   --  1.08  --   --  1.03  TROPONINI 0.04* 0.03*  --  <0.03 <0.03  --   --   < > = values in this interval not displayed.  Estimated Creatinine Clearance: 77.4 mL/min (by C-G formula based on SCr of 1.03 mg/dL).   Medical History: Past Medical History:  Diagnosis Date  . Abdominal aortic aneurysm ([place])   . Arthritis    "hips; lower spine" (05/24/2014)  . Hyperlipidemia   . Hypertension   . PE (pulmonary embolism) 9/15  . 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 aspirin 81 MG tablet Take 81 mg by mouth daily.   09/19/2016  . atorvastatin (LIPITOR) 40 MG tablet Take 40 mg by mouth daily.   09/18/2016   . sodium chloride 1 mL (09/20/16 1412)  . heparin 1,300 Units/hr (09/20/16 1553)     Assessment: 70yo male presents to [facility] with SOB. Pharmacy is consulted to dose heparin for PE/DVT.  Heparin is now therapeutic on 1300 units/hr.   No bleeding or complications noted.  CBC stable.  Goal of Therapy:  Heparin level 0.3-0.7 units/ml Monitor platelets by  anticoagulation protocol: Yes   Plan:  Continue Heparin 1300 units/hr IV infusion Daily heparin level and CBCs while on heparin  F/u plans for DOAC after all procedures complete.  Uvaldo Rising, BCPS  Clinical Pharmacist Pager [phone]  09/21/2016 8:25 AM

## 2016-09-21 NOTE — H&P (View-Only) (Signed)
Patient Name: BARRETT GOLDIE Date of Encounter: 09/21/2016  Primary Cardiologist: New- Dr. [facility] Problem List     Principal Problem:   Recurrent pulmonary embolism [facility]) Active Problems:   Hypertension   Hyperlipidemia   Primary osteoarthritis of right hip   Pericardial effusion   Ventricular tachycardia (HCC)   Renal cyst, right   Liver lesion   Nonsustained ventricular tachycardia (HCC)   Elevated troponin     Subjective   Having more dyspnea. Did not sleep well.   Inpatient Medications    Scheduled Meds: . aspirin  81 mg Oral Daily  . atorvastatin  20 mg Oral q1800  . sodium chloride flush  3 mL Intravenous Q12H   Continuous Infusions: . sodium chloride 1 mL (09/20/16 1412)  . heparin 1,300 Units/hr (09/20/16 1553)   PRN Meds: acetaminophen **OR** acetaminophen, albuterol, dextromethorphan-guaiFENesin, HYDROcodone-acetaminophen, morphine injection, ondansetron, zolpidem   Vital Signs    Vitals:   09/20/16 1954 09/20/16 2327 09/21/16 0351 09/21/16 0800  BP: 127/84 128/81 (!) 128/96 104/74  Pulse: 95 (!) 101 97 (!) 101  Resp: '19 17 19 20  '$ Temp: 98.8 F (37.1 C) 98 F (36.7 C) 98.1 F (36.7 C) 97.6 F (36.4 C)  TempSrc: Oral Oral Oral Oral  SpO2: 96% 95% 96% 95%  Weight:      Height:        Intake/Output Summary (Last 24 hours) at 09/21/16 0829 Last data filed at 09/21/16 0352  Gross per 24 hour  Intake          1018.77 ml  Output              525 ml  Net           493.77 ml   Filed Weights   09/19/16 1555 09/19/16 2109  Weight: 200 lb (90.7 kg) 202 lb 13.2 oz (92 kg)    Physical Exam    GEN: Well nourished, well developed, appears uncomfortable.  HEENT: Grossly normal.  Neck: Supple, JVD to jaw, carotid bruits, or masses. Cardiac: RRR, no murmurs, rubs, or gallops. No clubbing, cyanosis, edema.  Radials/DP/PT 2+ and equal bilaterally. Muffled heart sounds Respiratory:  Respirations regular and unlabored, clear to  auscultation bilaterally. GI: Soft, nontender, nondistended, BS + x 4. MS: no deformity or atrophy. Skin: warm and dry, no rash. Neuro:  Strength and sensation are intact. Psych: AAOx3.  Normal affect.  Labs    CBC  Recent Labs  09/20/16 0533 09/21/16 0246  WBC 8.8 10.0  HGB 14.9 15.3  HCT 44.7 45.8  MCV 94.7 95.0  PLT 108* 947*   Basic Metabolic Panel  Recent Labs  09/19/16 1930 09/20/16 0533 09/21/16 0246  NA  --  138 137  K  --  4.1 4.0  CL  --  109 107  CO2  --  21* 20*  GLUCOSE  --  118* 125*  BUN  --  14 18  CREATININE  --  1.08 1.03  CALCIUM  --  8.3* 8.5*  MG 2.0  --   --    Liver Function Tests  Recent Labs  09/21/16 0246  AST 39  ALT 69*  ALKPHOS 65  BILITOT 0.8  PROT 6.3*  ALBUMIN 3.3*   No results for input(s): LIPASE, AMYLASE in the last 72 hours. Cardiac Enzymes  Recent Labs  09/19/16 2331 09/20/16 0533 09/20/16 1441  TROPONINI 0.03* <0.03 <0.03   BNP Invalid input(s): POCBNP D-Dimer No results for input(s): DDIMER  in the last 72 hours. Hemoglobin A1C  Recent Labs  09/20/16 0533  HGBA1C 5.8*   Fasting Lipid Panel  Recent Labs  09/20/16 0533  CHOL 94  HDL 23*  LDLCALC 50  TRIG 106  CHOLHDL 4.1   Thyroid Function Tests  Recent Labs  09/20/16 0533  TSH 3.478    Telemetry    NSR with some NSVT  - Personally Reviewed  ECG      HR 94 Normal sinus rhythm Low voltage QRS   - Personally Reviewed  Radiology    Dg Chest 2 View  Result Date: 09/19/2016 CLINICAL DATA:  Shortness of breath with exertion EXAM: CHEST  2 VIEW COMPARISON:  05/06/2015 FINDINGS: Cardiac shadow is stable. The lungs are well aerated bilaterally. No focal infiltrate or sizable effusion is seen. Degenerative changes of the thoracic spine are noted. IMPRESSION: No active cardiopulmonary disease. Electronically Signed   By: Inez Catalina M.D.   On: 09/19/2016 16:18   Ct Angio Chest Pe W And/or Wo Contrast  Addendum Date: 09/19/2016     ADDENDUM REPORT: 09/19/2016 18:49 ADDENDUM: Recommend followup CT chest 6 months to document stability of the 8 mm pleural-based nodule left lower lobe. Electronically Signed   By: Marin Olp M.D.   On: 09/19/2016 18:49   Result Date: 09/19/2016 CLINICAL DATA:  Shortness of breath.  History of PD 2015. EXAM: CT ANGIOGRAPHY CHEST WITH CONTRAST TECHNIQUE: Multidetector CT imaging of the chest was performed using the standard protocol during bolus administration of intravenous contrast. Multiplanar CT image reconstructions and MIPs were obtained to evaluate the vascular anatomy. CONTRAST:  100 mL Isovue 370 IV. COMPARISON:  08/08/2014 and 08/05/2014 FINDINGS: Cardiovascular: Examination demonstrates the heart to be normal in size as there is a moderate size pericardial effusion. Thoracic aorta is otherwise within normal. Pulmonary arterial system demonstrates several tiny bilateral pulmonary emboli. RV/LV ratio is 0.99. Tiny focus of calcification along the left anterior descending coronary artery. Mediastinum/Nodes: There are a few shotty mediastinal lymph nodes. There is a 1 cm right hilar lymph node. Remaining mediastinal structures are unremarkable. Lungs/Pleura: Lungs are adequately inflated demonstrate mild paraseptal and centrilobular emphysematous change over the mid to upper lungs. 8 mm pleural based nodule opacity over the left lower lobe. Small right pleural effusion with associated atelectasis. Airways are within normal. Upper Abdomen: Partially visualized 3.4 cm cyst over the upper pole right kidney. 1.2 cm hypodensity along the periphery of the left lobe of the liver. Colonic diverticulosis. Musculoskeletal: Degenerative change of the spine. Review of the MIP images confirms the above findings. IMPRESSION: Several tiny bilateral pulmonary emboli. Small associated right pleural effusion and right basilar atelectasis. RV/LV ratio 0.99 compatible with intermediate risk pulmonary embolism. The  presence of right heart strain has been associated with an increased risk of morbidity and mortality. Moderate size pericardial effusion. Mild emphysematous disease. 1.2 cm hypodensity over the periphery of the left lobe of the liver not well seen previously. Consider MRI on elective basis for further evaluation. Right renal cyst. Critical Value/emergent results were called by telephone at the time of interpretation on 09/19/2016 at 5:42 pm to Dr. Davonna Belling , who verbally acknowledged these results. Electronically Signed: By: Marin Olp M.D. On: 09/19/2016 17:42   Mr Abdomen W Wo Contrast  Result Date: 09/20/2016 CLINICAL DATA:  70 year old male with history of right renal cyst and indeterminate lesion in the left lobe of the liver. Followup study. EXAM: MRI ABDOMEN WITHOUT AND WITH CONTRAST TECHNIQUE: Multiplanar multisequence  MR imaging of the abdomen was performed both before and after the administration of intravenous contrast. CONTRAST:  27m MULTIHANCE GADOBENATE DIMEGLUMINE 529 MG/ML IV SOLN COMPARISON:  No prior abdominal MRI.  CT of the chest 09/19/2016. FINDINGS: Lower chest: Small right pleural effusion. Signal intensity in the dependent right lower lobe likely reflects areas of subsegmental atelectasis. Moderate pericardial effusion. Hepatobiliary: The lesion of concern in the periphery of segment 4A of the liver measures 13 mm in diameter and is T1 hypointense, T2 hyperintense, and does not enhance, compatible with a simple hepatic cyst. No suspicious hepatic lesions are noted. No intra or extrahepatic biliary ductal dilatation. Gallbladder is unremarkable in appearance. Pancreas: No pancreatic mass. No pancreatic ductal dilatation. No pancreatic or peripancreatic fluid or inflammatory changes. Spleen:  Unremarkable. Adrenals/Urinary Tract: Within the kidneys bilaterally there are several T1 hypointense, T2 hyperintense, nonenhancing lesions, compatible with simple cysts. In addition,  there is a very large exophytic lesion extending off the lower pole of the right kidney which measures up to 13.7 x 10.3 x 11.7 cm (axial image 36 of series 7 and coronal image 26 of series 4), which has 3 thin internal septations which do not demonstrate significant thickening or enhancement, and demonstrates no mural nodularity or other internal enhancement, compatible with a mildly complex cyst (Bosniak class 2). No other suspicious renal lesions are noted. No hydroureteronephrosis. Bilateral adrenal glands are normal in appearance. Stomach/Bowel: Visualized portions are unremarkable. Vascular/Lymphatic: Aortic atherosclerosis, with aneurysmal dilatation of the infrarenal abdominal aorta which measures up to 3.6 x 3.0 cm (image 108 of series 1101) where there is some eccentric nonenhancing material, compatible with a combination of atheromatous plaque and/or mural thrombus. No lymphadenopathy noted in the abdomen. Other: Perinephric stranding bilaterally (nonspecific). No significant volume of ascites. Musculoskeletal: No aggressive osseous lesions are noted in the visualized portions of the skeleton. IMPRESSION: 1. The lesion of concern in the liver is compatible with a small simple cyst. This is a benign finding. 2. 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 3. Infrarenal abdominal aortic aneurysm measuring up to 3.0 x 3.6 cm (mean diameter of 3.3 cm). Recommend followup by ultrasound in 3 years. This recommendation follows ACR consensus guidelines: White Paper of the ACR Incidental Findings Committee II on Vascular Findings. J Am Coll Radiol 2013; 10:789-794. 4. Moderate pericardial effusion. 5. Small right pleural effusion with some passive atelectasis in the right lower lobe. Electronically Signed   By: DVinnie LangtonM.D.   On:  09/20/2016 13:19    Cardiac Studies   2D ECHO: 09/20/2016 LV EF: 60% -   65% Study Conclusions - Left ventricle: The cavity size was normal. Wall thickness was   normal. Systolic function was normal. The estimated ejection   fraction was in the range of 60% to 65%. Wall motion was normal;   there were no regional wall motion abnormalities. Doppler   parameters are consistent with abnormal left ventricular   relaxation (grade 1 diastolic dysfunction). - Pericardium, extracardiac: A large pericardial effusion was   identified circumferential to the heart. Impressions: - There is a large pericardial effusion with signs of early   tamponade   There is invagination of the RV and RA.   There is increased respiratory variability of the MV inflow.   The IVC is at the upper limits of normal  and does not collapse   with sniff.   The patient also has a pulmonary embolus and a mobile thrombus in   his right calf.   The plan is to treat with IV heparin overnight and recheck echo   and venous duplex tomorrow   I would prefer to reduce the thrombus burden before we hold the   heparin for pericardiocentesis.  Patient Profile     70 YO male with history of previous DVT/PE s/p EKOS (8 weeks post op, completed 1 yr Eliquis then d/c), AAA, HLD, HTN who presented to [facility] on [facility]/19/16 with 3 days of shortness of breath. He was found to have several bilateral pulmonary emboli with right pleural effusion, moderate pericardial effusion and NSVT and transferred [facility] stepdown on medicine service  Assessment & Plan    Recurrent PE: with evidence of right heart strain on CT scan and mildly elevated troponin (0.04). Started on heparin which was been continued for ~ 48 hours. He is now more dyspneic. PEs too small to be causing this degree of dyspnea per Dr. Titus Mould. Plan is to stop heparin gtt and proceed with pericardiocentesis. He will need lifelong anticoagulation  Large pericardial effusion: 2D ECHO  yesterday showed there is a large pericardial effusion with signs of early tamponade. Now much more dyspneic then yesterday. As above, holding IV heparin for pericardiocentesis.   HTN: fortunately BP has remained stable.  HLD: continue statin   NSVT: continue to monitor  Signed, Angelena Form, PA-C  09/21/2016, 8:29 AM  Patient seen and examined and history reviewed. Agree with above findings and plan. 70 yo WM admitted with acute SOB. Has a history of DVT/PE. S/p EKOS and on anticoagulation for one year. On admission CT shows bilateral small pulmonary emboli. Also found to have a large pericardial effusion. Overnight notes worsening SOB. He is more tachycardic. Discussed with pulmonary and it is felt that dyspnea is out of proportion to his small PEs. I personally reviewed Echo from yesterday. Large circumferential effusion with RA and RV collapse. IVC dilated. On exam he has JVD to jaw. Appears uncomfortable. I agree that cardiac tamponade is the primary issue now. He has been anticoagulated for 48 hours. Will stop heparin now and plan on pericardiocentesis this am. Etiology of pericardial effusion is unknown. Will need to send studies on fluid. If it is significantly hemorrhagic may need to hold heparin and consider an IVC filter. If clear would resume heparin later today. Discussed procedure at length with patient. Risks reviewed including risk of bleeding, arrhythmia, laceration of heart/liver/lung, pneumothorax. Patient understands and is willing to proceed.  Wilmer Berryhill [place], [place] 09/21/2016 8:47 AM

## 2016-09-21 NOTE — Progress Notes (Signed)
  Echocardiogram 2D Echocardiogram limited has been performed.  Nicholas Hale 09/21/2016, 10:55 AM

## 2016-09-22 ENCOUNTER — Inpatient Hospital Stay (HOSPITAL_COMMUNITY): Payer: Medicare Other

## 2016-09-22 DIAGNOSIS — I824Y3 Acute embolism and thrombosis of unspecified deep veins of proximal lower extremity, bilateral: Secondary | ICD-10-CM

## 2016-09-22 DIAGNOSIS — J439 Emphysema, unspecified: Secondary | ICD-10-CM

## 2016-09-22 DIAGNOSIS — I319 Disease of pericardium, unspecified: Secondary | ICD-10-CM

## 2016-09-22 LAB — MISC LABCORP TEST (SEND OUT)
LABCORP TEST CODE: 100156
LABCORP TEST CODE: 100479
LABCORP TEST CODE: 19588
Labcorp test code: 19497

## 2016-09-22 LAB — CBC
HCT: 44.8 % (ref 39.0–52.0)
Hemoglobin: 15 g/dL (ref 13.0–17.0)
MCH: 31.7 pg (ref 26.0–34.0)
MCHC: 33.5 g/dL (ref 30.0–36.0)
MCV: 94.7 fL (ref 78.0–100.0)
PLATELETS: 109 10*3/uL — AB (ref 150–400)
RBC: 4.73 MIL/uL (ref 4.22–5.81)
RDW: 13.2 % (ref 11.5–15.5)
WBC: 8.8 10*3/uL (ref 4.0–10.5)

## 2016-09-22 LAB — BASIC METABOLIC PANEL
Anion gap: 8 (ref 5–15)
BUN: 14 mg/dL (ref 6–20)
CALCIUM: 8.1 mg/dL — AB (ref 8.9–10.3)
CO2: 24 mmol/L (ref 22–32)
CREATININE: 1.02 mg/dL (ref 0.61–1.24)
Chloride: 107 mmol/L (ref 101–111)
GFR calc non Af Amer: >60 mL/min (ref >60)
GLUCOSE: 85 mg/dL (ref 65–99)
Potassium: 4 mmol/L (ref 3.5–5.1)
Sodium: 139 mmol/L (ref 135–145)

## 2016-09-22 LAB — ECHOCARDIOGRAM LIMITED
Height: 71 in
Weight: 3259.28 oz

## 2016-09-22 LAB — PH, BODY FLUID: PH, BODY FLUID: 6.8

## 2016-09-22 LAB — HEPARIN LEVEL (UNFRACTIONATED): Heparin Unfractionated: 0.4 IU/mL (ref 0.30–0.70)

## 2016-09-22 MED ORDER — RIVAROXABAN 20 MG PO TABS: 20.0000 mg | ORAL_TABLET | Freq: Every day | ORAL | Status: DC

## 2016-09-22 MED ORDER — RIVAROXABAN 15 MG PO TABS
15.0000 mg | ORAL_TABLET | Freq: Two times a day (BID) | ORAL | Status: DC
  Administered 2016-09-22 – 2016-09-23 (×2): 15 mg via ORAL
  Filled 2016-09-22 (×3): qty 1

## 2016-09-22 MED FILL — Heparin Sodium (Porcine) 2 Unit/ML in Sodium Chloride 0.9%: INTRAMUSCULAR | Qty: 1000 | Status: AC

## 2016-09-22 NOTE — Progress Notes (Signed)
PROGRESS NOTE    TRAYVION EMBLETON  PTW:[id number] DOB: 10/18/1946 DOA: 09/19/2016 PCP: [name], MD  Brief Narrative: Nicholas Hale is a 70 y.o. male with medical history significant of DVT/PE s/p EKOS in 2015, completed 1 yr Eliquis then d/c) AAA,  large right renal cyst,  presented with shortness of breathx3days CT scan showed several bilateral pulmonary emboli with right pleural effusion, moderate pericardial effusion. Started on IV heparin and also noted to have Acute DVTs ECHO 10/22: with large effusion and early tamponade, Cards and PCCM following -10/23 am more dyspnea and underwent Urgent Pericardiocentesis  Assessment & Plan:  1. Acute Bilateral PE and DVT -On Day 3 of IV heparin -h/o EKOS followed by Eliquis 2years back for PE, stopped Eliquis 1 year ago. -saw hematologist in the past and tested negative for hypercoagulable disorders -heparin was held temporarily for Pericardiocentesis yesterday and then resumed -change to DOAC depending on pericardial effusion  2. Large Pericardial effusion on CTA -early tamponade physiology on ECHO noted 10/22 afternoon -underwent urgent Pericardiocentesis 10/23 am and 850cc serosanguinous fluid drained, blood tinged now -Cards following, plan for repeat ECHO -FU culture and cytology  3. Renal cyst, right and Liver lesion: pt had hx of a large right renal cyst (12 cm by [place] on 01/10/15). He was found to have a 1.2 cm hypodensity over the periphery of the left lobe by CTA today.  Not sure if his large renal cyst could have compressed venous return, contributing to DVT formation. -MRI notes liver and renal cysts  4. HTN:  -not on medications at home. -continue IVF today  5. HLD:  -Continue Lipitor  DVT prophylaxis:IV Heparin Code Status:Full Code Family Communication:wife at bedside, called and d/w daughter Truman Hayward yesterday Disposition Plan:Transfer to tele tomorrow if stable  Consultants:  Cards  Subjective: Feels  better, breathing so much better  Objective: Vitals:   09/22/16 0700 09/22/16 0800 09/22/16 0900 09/22/16 1000  BP: 105/68 119/73 125/68 112/61  Pulse: 63 80 84 79  Resp: '10 16 16 13  '$ Temp:  98.1 F (36.7 C)    TempSrc:  Oral    SpO2: 96% 95% 91% 91%  Weight:      Height:        Intake/Output Summary (Last 24 hours) at 09/22/16 1036 Last data filed at 09/22/16 1000  Gross per 24 hour  Intake           3035.7 ml  Output             2005 ml  Net           1030.7 ml   Filed Weights   09/19/16 1555 09/19/16 2109 09/21/16 1107  Weight: 90.7 kg (200 lb) 92 kg (202 lb 13.2 oz) 92.4 kg (203 lb 11.3 oz)    Examination:  General exam:AAOx3, no distress Respiratory system: decreased BS at bases Cardiovascular system: S1 & S2 heard, RRR Pericardial; drain noted Gastrointestinal system: Abdomen is nondistended, soft and nontender. No organomegaly or masses felt. Normal bowel sounds heard. Central nervous system: Alert and oriented. No focal neurological deficits. Extremities: Symmetric 5 x 5 power. Skin: No rashes, lesions or ulcers Psychiatry: Judgement and insight appear normal. Mood & affect appropriate.     Data Reviewed: I have personally reviewed following labs and imaging studies  CBC:  Recent Labs Lab 09/19/16 1615 09/20/16 0533 09/21/16 0246 09/22/16 0513  WBC 8.9 8.8 10.0 8.8  HGB 16.4 14.9 15.3 15.0  HCT 49.5 44.7 45.8  44.8  MCV 95.7 94.7 95.0 94.7  PLT PLATELETS APPEAR ADEQUATE 108* 116* 938*   Basic Metabolic Panel:  Recent Labs Lab 09/19/16 1615 09/19/16 1930 09/20/16 0533 09/21/16 0246 09/22/16 0829  NA 139  --  138 137 139  K 4.0  --  4.1 4.0 4.0  CL 106  --  109 107 107  CO2 26  --  21* 20* 24  GLUCOSE 106*  --  118* 125* 85  BUN 16  --  '14 18 14  '$ CREATININE 1.14  --  1.08 1.03 1.02  CALCIUM 8.9  --  8.3* 8.5* 8.1*  MG  --  2.0  --   --   --    GFR: Estimated Creatinine Clearance: 78.3 mL/min (by C-G formula based on SCr of 1.02  mg/dL). Liver Function Tests:  Recent Labs Lab 09/21/16 0246  AST 39  ALT 69*  ALKPHOS 65  BILITOT 0.8  PROT 6.3*  ALBUMIN 3.3*   No results for input(s): LIPASE, AMYLASE in the last 168 hours. No results for input(s): AMMONIA in the last 168 hours. Coagulation Profile:  Recent Labs Lab 09/19/16 2331  INR 1.27   Cardiac Enzymes:  Recent Labs Lab 09/19/16 1615 09/19/16 2331 09/20/16 0533 09/20/16 1441  TROPONINI 0.04* 0.03* <0.03 <0.03   BNP (last 3 results) No results for input(s): PROBNP in the last 8760 hours. HbA1C:  Recent Labs  09/20/16 0533  HGBA1C 5.8*   CBG:  Recent Labs Lab 09/20/16 0807 09/21/16 0757  GLUCAP 126* 119*   Lipid Profile:  Recent Labs  09/20/16 0533  CHOL 94  HDL 23*  LDLCALC 50  TRIG 106  CHOLHDL 4.1   Thyroid Function Tests:  Recent Labs  09/20/16 0533  TSH 3.478   Anemia Panel: No results for input(s): VITAMINB12, FOLATE, FERRITIN, TIBC, IRON, RETICCTPCT in the last 72 hours. Urine analysis:    Component Value Date/Time   COLORURINE YELLOW 05/06/2015 0935   APPEARANCEUR CLEAR 05/06/2015 0935   LABSPEC 1.003 (L) 05/06/2015 0935   PHURINE 6.0 05/06/2015 0935   GLUCOSEU NEGATIVE 05/06/2015 0935   HGBUR NEGATIVE 05/06/2015 0935   BILIRUBINUR NEGATIVE 05/06/2015 0935   KETONESUR NEGATIVE 05/06/2015 0935   PROTEINUR NEGATIVE 05/06/2015 0935   UROBILINOGEN 0.2 05/06/2015 0935   NITRITE NEGATIVE 05/06/2015 0935   LEUKOCYTESUR NEGATIVE 05/06/2015 0935   Sepsis Labs: '@LABRCNTIP'$ (procalcitonin:4,lacticidven:4)  ) Recent Results (from the past 240 hour(s))  MRSA PCR Screening     Status: None   Collection Time: 09/20/16  2:08 AM  Result Value Ref Range Status   MRSA by PCR NEGATIVE NEGATIVE Final    Comment:        The GeneXpert MRSA Assay (FDA approved for NASAL specimens only), is one component of a comprehensive MRSA colonization surveillance program. It is not intended to diagnose MRSA infection nor  to guide or monitor treatment for MRSA infections.   Gram stain     Status: None   Collection Time: 09/21/16 10:05 AM  Result Value Ref Range Status   Specimen Description PERICARDIUM  Final   Special Requests NONE  Final   Gram Stain   Final    FEW WBC PRESENT,BOTH PMN AND MONONUCLEAR NO ORGANISMS SEEN    Report Status 09/21/2016 FINAL  Final         Radiology Studies: Mr Abdomen W Wo Contrast  Result Date: 09/20/2016 CLINICAL DATA:  70 year old male with history of right renal cyst and indeterminate lesion in the left lobe of  the liver. Followup study. EXAM: MRI ABDOMEN WITHOUT AND WITH CONTRAST TECHNIQUE: Multiplanar multisequence MR imaging of the abdomen was performed both before and after the administration of intravenous contrast. CONTRAST:  73m MULTIHANCE GADOBENATE DIMEGLUMINE 529 MG/ML IV SOLN COMPARISON:  No prior abdominal MRI.  CT of the chest 09/19/2016. FINDINGS: Lower chest: Small right pleural effusion. Signal intensity in the dependent right lower lobe likely reflects areas of subsegmental atelectasis. Moderate pericardial effusion. Hepatobiliary: The lesion of concern in the periphery of segment 4A of the liver measures 13 mm in diameter and is T1 hypointense, T2 hyperintense, and does not enhance, compatible with a simple hepatic cyst. No suspicious hepatic lesions are noted. No intra or extrahepatic biliary ductal dilatation. Gallbladder is unremarkable in appearance. Pancreas: No pancreatic mass. No pancreatic ductal dilatation. No pancreatic or peripancreatic fluid or inflammatory changes. Spleen:  Unremarkable. Adrenals/Urinary Tract: Within the kidneys bilaterally there are several T1 hypointense, T2 hyperintense, nonenhancing lesions, compatible with simple cysts. In addition, there is a very large exophytic lesion extending off the lower pole of the right kidney which measures up to 13.7 x 10.3 x 11.7 cm (axial image 36 of series 7 and coronal image 26 of series  4), which has 3 thin internal septations which do not demonstrate significant thickening or enhancement, and demonstrates no mural nodularity or other internal enhancement, compatible with a mildly complex cyst (Bosniak class 2). No other suspicious renal lesions are noted. No hydroureteronephrosis. Bilateral adrenal glands are normal in appearance. Stomach/Bowel: Visualized portions are unremarkable. Vascular/Lymphatic: Aortic atherosclerosis, with aneurysmal dilatation of the infrarenal abdominal aorta which measures up to 3.6 x 3.0 cm (image 108 of series 1101) where there is some eccentric nonenhancing material, compatible with a combination of atheromatous plaque and/or mural thrombus. No lymphadenopathy noted in the abdomen. Other: Perinephric stranding bilaterally (nonspecific). No significant volume of ascites. Musculoskeletal: No aggressive osseous lesions are noted in the visualized portions of the skeleton. IMPRESSION: 1. The lesion of concern in the liver is compatible with a small simple cyst. This is a benign finding. 2. 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 3. Infrarenal abdominal aortic aneurysm measuring up to 3.0 x 3.6 cm (mean diameter of 3.3 cm). Recommend followup by ultrasound in 3 years. This recommendation follows ACR consensus guidelines: White Paper of the ACR Incidental Findings Committee II on Vascular Findings. J Am Coll Radiol 2013; 10:789-794. 4. Moderate pericardial effusion. 5. Small right pleural effusion with some passive atelectasis in the right lower lobe. Electronically Signed   By: DVinnie LangtonM.D.   On: 09/20/2016 13:19   Dg Chest Port 1 View  Result Date: 09/21/2016 CLINICAL DATA:  Pericardiocentesis . EXAM: PORTABLE CHEST 1 VIEW COMPARISON:  CT 09/19/2016 FINDINGS: Drainage catheter  noted over the lower chest, presumably of pericardial drainage catheter. Mediastinum and hilar structures are normal. Mild right lower lobe infiltrate noted. Small right pleural effusion. No pneumothorax. Heart size normal. Cervical spine fusion . IMPRESSION: 1. Drainage catheter noted over the lower mid chest, presumably a pericardial drainage catheter. No evidence of cardiomegaly. No pneumothorax. 2. Mild infiltrate right lung base.  Mild right pleural effusion . Electronically Signed   By: TPajaros  On: 09/21/2016 11:57        Scheduled Meds: . aspirin  81 mg Oral Daily  . atorvastatin  20 mg Oral q1800  . colchicine  0.6 mg Oral BID  . sodium chloride flush  3 mL Intravenous Q12H  . sodium chloride flush  3 mL Intravenous Q12H  . sodium chloride flush  3 mL Intravenous Q12H   Continuous Infusions: . sodium chloride 75 mL/hr at 09/22/16 1000  . heparin 1,300 Units/hr (09/22/16 1000)     LOS: 3 days    Time spent: 59mn    PDomenic Polite MD [facility] Pager [phone] If 7PM-7AM, please contact night-coverage [email or web address] Password [email or web address] 09/22/2016, 10:36 AM

## 2016-09-22 NOTE — Care Management Note (Signed)
Case Management Note  Patient Details  Name: Nicholas Hale MRN: [id number] Date of Birth: 05-01-1946  Subjective/Objective:         Adm w pul embolus           Action/Plan: lives w wife, pcp dr Radene Ou   Expected Discharge Date:                  Expected Discharge Plan:  Home/Self Care  In-House Referral:     Discharge planning Services  CM Consult, Medication Assistance  Post Acute Care Choice:    Choice offered to:     DME Arranged:    DME Agency:     HH Arranged:    HH Agency:     Status of Service:  In process, will continue to follow  If discussed at Long Length of Stay Meetings, dates discussed:    Additional Comments: gave pt 30day free xarelto card.   Lacretia Leigh, RN 09/22/2016, 2:25 PM

## 2016-09-22 NOTE — Progress Notes (Signed)
Echo reviewed. It shows no residual pericardial effusion.  Pericardial drain removed and dressing applied.  Will be able to get up in one hour.  Will start Xarelto this pm and DC IV heparin per pharmacy.  Peter Martinique MD, [facility]  09/22/2016 12:39 PM

## 2016-09-22 NOTE — Progress Notes (Signed)
ANTICOAGULATION CONSULT NOTE - Follow Up Consult  Pharmacy Consult for heparin Indication: pulmonary embolus  Labs:  Recent Labs  09/19/16 1615 09/19/16 2331  09/20/16 0533 09/20/16 1441 09/20/16 2233 09/21/16 0246 09/21/16 2225  HGB 16.4  --   --  14.9  --   --  15.3  --   HCT 49.5  --   --  44.7  --   --  45.8  --   PLT PLATELETS APPEAR ADEQUATE  --   --  108*  --   --  116*  --   LABPROT  --  16.0*  --   --   --   --   --   --   INR  --  1.27  --   --   --   --   --   --   HEPARINUNFRC  --   --   < >  --  0.82* 0.58 0.49 0.29*  CREATININE 1.14  --   --  1.08  --   --  1.03  --   TROPONINI 0.04* 0.03*  --  <0.03 <0.03  --   --   --   < > = values in this interval not displayed.   Assessment/Plan:  70yo male slightly subtherapeutic on heparin after resumed but has been stable at current rate. Will continue gtt at current rate for now and check additional level.   Wynona Neat, PharmD, BCPS  09/22/2016,12:21 AM

## 2016-09-22 NOTE — Progress Notes (Signed)
ANTICOAGULATION CONSULT NOTE - Follow-Up Consult  Pharmacy Consult for Heparin>> xarelto  Indication: pulmonary embolus  No Known Allergies  Patient Measurements: Height: '5\' 11"'$  (180.3 cm) Weight: 203 lb 11.3 oz (92.4 kg) IBW/kg (Calculated) : 75.3 Heparin Dosing Weight: 90.7 kg  Vital Signs: Temp: 98.2 F (36.8 C) (10/24 1200) Temp Source: Oral (10/24 1200) BP: 129/68 (10/24 1300) Pulse Rate: 76 (10/24 1300)  Labs:  Recent Labs  09/19/16 2331  09/20/16 0533 09/20/16 1441  09/21/16 0246 09/21/16 2225 09/22/16 0513 09/22/16 0829  HGB  --   --  14.9  --   --  15.3  --  15.0  --   HCT  --   --  44.7  --   --  45.8  --  44.8  --   PLT  --   --  108*  --   --  116*  --  109*  --   LABPROT 16.0*  --   --   --   --   --   --   --   --   INR 1.27  --   --   --   --   --   --   --   --   HEPARINUNFRC  --   < >  --  0.82*  < > 0.49 0.29*  --  0.40  CREATININE  --   --  1.08  --   --  1.03  --   --  1.02  TROPONINI 0.03*  --  <0.03 <0.03  --   --   --   --   --   < > = values in this interval not displayed.  Estimated Creatinine Clearance: 78.3 mL/min (by C-G formula based on SCr of 1.02 mg/dL).   Assessment: 70yo male presents to [facility] with SOB noted with PE/DVT (history of DVT/PE in 2015) on heparin. Pharmacy consulted to transition to Xarelto. -CrCl ~ 80  Goal of Therapy:  Heparin level 0.3-0.7 units/ml Monitor platelets by anticoagulation protocol: Yes   Plan:  -Xarelto '15mg'$  po bid for 21 days ('20mg'$  po daily to start on 11/15 ). Begin at dinner -Discontinue heparin  -Will provide patient education  Hildred Laser, [place] D 09/22/2016 1:12 PM

## 2016-09-22 NOTE — Progress Notes (Signed)
ANTICOAGULATION CONSULT NOTE - Follow-Up Consult  Pharmacy Consult for Heparin Indication: pulmonary embolus  No Known Allergies  Patient Measurements: Height: '5\' 11"'$  (180.3 cm) Weight: 203 lb 11.3 oz (92.4 kg) IBW/kg (Calculated) : 75.3 Heparin Dosing Weight: 90.7 kg  Vital Signs: Temp: 98.1 F (36.7 C) (10/24 0800) Temp Source: Oral (10/24 0800) BP: 119/73 (10/24 0800) Pulse Rate: 80 (10/24 0800)  Labs:  Recent Labs  09/19/16 2331  09/20/16 0533 09/20/16 1441  09/21/16 0246 09/21/16 2225 09/22/16 0513 09/22/16 0829  HGB  --   --  14.9  --   --  15.3  --  15.0  --   HCT  --   --  44.7  --   --  45.8  --  44.8  --   PLT  --   --  108*  --   --  116*  --  109*  --   LABPROT 16.0*  --   --   --   --   --   --   --   --   INR 1.27  --   --   --   --   --   --   --   --   HEPARINUNFRC  --   < >  --  0.82*  < > 0.49 0.29*  --  0.40  CREATININE  --   --  1.08  --   --  1.03  --   --  1.02  TROPONINI 0.03*  --  <0.03 <0.03  --   --   --   --   --   < > = values in this interval not displayed.  Estimated Creatinine Clearance: 78.3 mL/min (by C-G formula based on SCr of 1.02 mg/dL).   Assessment: 70yo male presents to [facility] with SOB noted with PE/DVT (history of DVT/PE in 2015) on heparin. Noted plans for Xarelto after pericardial drain removed. -heparin level= 0.4 on 1300 units/hr  Goal of Therapy:  Heparin level 0.3-0.7 units/ml Monitor platelets by anticoagulation protocol: Yes   Plan:  -Restart heparin at 1300 units/hr -Heparin daily wth CBC daily -Will follow plans for oral anticoagulation  Hildred Laser, Pharm D 09/22/2016 10:12 AM

## 2016-09-22 NOTE — Progress Notes (Signed)
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  CT chest revealed small bilateral PE and moderate pericardial effusion.  He was tx to [facility] for further treatment. He was started on heparin gtt and admitted to SDU by hospitalist and seen in consultation by cardiology r/t pericardial effusion and mildly elevated troponin (0.04).   On 10/23 he had progressive SOB without hypoxia and PCCM consulted to assist.    SIGNIFICANT EVENTS  10/21 - Admit 10/23 - Pericardiocentesis:  Removal 860cc of serosanguineous fluid w/ drain left in place  STUDIES:  CTA chest 10/21: Several tiny bilateral pulmonary emboli. Small associated right pleural effusion and right basilar atelectasis. RV/LV ratio 0.99 compatible with intermediate risk pulmonary embolism.  Moderate size pericardial effusion. Mild emphysematous disease. 8 mm pleural based nodule opacity over the left lower lobe. Small right pleural effusion with associated atelectasis. BLE venous dopplers 10/22: There is acute, mobile thrombus noted in the right soleal vein.  Acute DVT noted in the right gastrocnemius and peroneal veins and acute DVT noted in the left peroneal vein.   TTE 10/22:  There is a large pericardial effusion with signs of early tamponade. There is invagination of the RV and RA. There is increased respiratory variability of the MV inflow. The IVC is at the upper limits of normal and does not collapse with sniff. TTE 10/23:  Pericardium:  Very limited exam In first images a large pericardial effusion surrounds the heart There is some organization tissue along outer surface of heart. ? thrombus. Last two images fluid is  no longer present.  PERICARDIAL FLUID (09/21/16): Bloody WBC 793 (63% lymph, 16% neutro, 21% macro/monocyte) Cytology >>  MICROBIOLOGY: MRSA PCR 10/22:  Negative Pericardial Effusion 10/23 >>  SUBJECTIVE: Patient underwent pericardiocentesis with removal of 860 mL of serosanguineous fluid with drain left in place. Heparin drip was restarted overnight after midnight. Denies any chest tightness or pressure. Continues to have pain at site of pericardial drain insertion. Reports dyspnea has significantly improved. Continues to have a nonproductive cough.  REVIEW OF SYSTEMS:  No subjective fever, chills or sweats. No abdominal pain or nausea. No headache or vision changes.  VITAL SIGNS: Temp:  [97.3 F (36.3 C)-98.3 F (36.8 C)] 98 F (36.7 C) (10/24 0300) Pulse Rate:  [63-126] 80 (10/24 0800) Resp:  [10-23] 16 (10/24 0800) BP: (90-147)/(55-91) 119/73 (10/24 0800) SpO2:  [89 %-100 %] 89 % (10/24 0800) Weight:  [203 lb 11.3 oz (92.4 kg)] 203 lb 11.3 oz (92.4 kg) (10/23 1107) - ON RA  PHYSICAL EXAMINATION: General:  Awake. Wife at bedside. No distress. Neuro:  Alert and oriented 4. Grossly nonfocal. No meningismus. HEENT:  Moist mucous membranes. No scleral icterus or injection. Cardiovascular:  Regular rate & rhythm. No edema. Lungs:  Normal work of breathing on room air. Speaking in complete sentences. Clear on auscultation. Abdomen:  Soft. Protuberant. Nontender. Musculoskeletal:  Subxiphoid drain in place. Integument: Warm and dry. No rash on exposed skin.   Recent Labs Lab 09/19/16 1615 09/20/16 0533 09/21/16 0246  NA 139 138 137  K 4.0 4.1 4.0  CL 106 109 107  CO2 26 21* 20*  BUN 16  14 18  CREATININE 1.14 1.08 1.03  GLUCOSE 106* 118* 125*    Recent Labs Lab 09/20/16 0533 09/21/16 0246 09/22/16 0513  HGB 14.9 15.3 15.0  HCT 44.7 45.8 44.8  WBC 8.8 10.0 8.8  PLT 108* 116* 109*   Mr Abdomen W Wo Contrast  Result Date: 09/20/2016 CLINICAL DATA:  70 year old  male with history of right renal cyst and indeterminate lesion in the left lobe of the liver. Followup study. EXAM: MRI ABDOMEN WITHOUT AND WITH CONTRAST TECHNIQUE: Multiplanar multisequence MR imaging of the abdomen was performed both before and after the administration of intravenous contrast. CONTRAST:  70m MULTIHANCE GADOBENATE DIMEGLUMINE 529 MG/ML IV SOLN COMPARISON:  No prior abdominal MRI.  CT of the chest 09/19/2016. FINDINGS: Lower chest: Small right pleural effusion. Signal intensity in the dependent right lower lobe likely reflects areas of subsegmental atelectasis. Moderate pericardial effusion. Hepatobiliary: The lesion of concern in the periphery of segment 4A of the liver measures 13 mm in diameter and is T1 hypointense, T2 hyperintense, and does not enhance, compatible with a simple hepatic cyst. No suspicious hepatic lesions are noted. No intra or extrahepatic biliary ductal dilatation. Gallbladder is unremarkable in appearance. Pancreas: No pancreatic mass. No pancreatic ductal dilatation. No pancreatic or peripancreatic fluid or inflammatory changes. Spleen:  Unremarkable. Adrenals/Urinary Tract: Within the kidneys bilaterally there are several T1 hypointense, T2 hyperintense, nonenhancing lesions, compatible with simple cysts. In addition, there is a very large exophytic lesion extending off the lower pole of the right kidney which measures up to 13.7 x 10.3 x 11.7 cm (axial image 36 of series 7 and coronal image 26 of series 4), which has 3 thin internal septations which do not demonstrate significant thickening or enhancement, and demonstrates no mural nodularity or other internal enhancement, compatible with a mildly complex cyst (Bosniak class 2). No other suspicious renal lesions are noted. No hydroureteronephrosis. Bilateral adrenal glands are normal in appearance. Stomach/Bowel: Visualized portions are unremarkable. Vascular/Lymphatic: Aortic atherosclerosis, with aneurysmal dilatation  of the infrarenal abdominal aorta which measures up to 3.6 x 3.0 cm (image 108 of series 1101) where there is some eccentric nonenhancing material, compatible with a combination of atheromatous plaque and/or mural thrombus. No lymphadenopathy noted in the abdomen. Other: Perinephric stranding bilaterally (nonspecific). No significant volume of ascites. Musculoskeletal: No aggressive osseous lesions are noted in the visualized portions of the skeleton. IMPRESSION: 1. The lesion of concern in the liver is compatible with a small simple cyst. This is a benign finding. 2. 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 3. Infrarenal abdominal aortic aneurysm measuring up to 3.0 x 3.6 cm (mean diameter of 3.3 cm). Recommend followup by ultrasound in 3 years. This recommendation follows ACR consensus guidelines: White Paper of the ACR Incidental Findings Committee II on Vascular Findings. J Am Coll Radiol 2013; 10:789-794. 4. Moderate pericardial effusion. 5. Small right pleural effusion with some passive atelectasis in the right lower lobe. Electronically Signed   By: DVinnie LangtonM.D.   On: 09/20/2016 13:19   Dg Chest Port 1 View  Result Date: 09/21/2016 CLINICAL DATA:  Pericardiocentesis . EXAM: PORTABLE CHEST 1 VIEW COMPARISON:  CT 09/19/2016 FINDINGS: Drainage catheter noted over the lower chest, presumably of pericardial drainage catheter. Mediastinum and hilar structures are normal. Mild right lower lobe infiltrate noted. Small right pleural effusion. No  pneumothorax. Heart size normal. Cervical spine fusion . IMPRESSION: 1. Drainage catheter noted over the lower mid chest, presumably a pericardial drainage catheter. No evidence of cardiomegaly. No pneumothorax. 2. Mild infiltrate right lung base.  Mild right pleural effusion .  Electronically Signed   By: Marcello Moores  Register   On: 09/21/2016 11:57    ASSESSMENT / PLAN:  70 y.o. male with tiny bilateral pulmonary emboli as well as large pericardial effusion. Patient also notably has emphysema on CT imaging of the chest. Patient's degree of dyspnea proportion to pulmonary emboli and without hypoxia therefore arguing against contribution of underlying pulmonary emboli to the patient's dyspnea. Patient would pericardiocentesis yesterday with reported serosanguineous blood. Patient does have some bloody fluid with that and his drain catheter but tapering output from catheter overnight.  1. Pericardial Effusion: Defer to cardiology and management while awaiting pericardial fluid studies as well as culture and cytology. Pericardial drain in place. 2. 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 If intolerant of anticoagulation with reaccumulation of pericardial effusion recommend IVC filter placement and close follow-up in our pulmonary clinic with a reattempt at systemic anticoagulation once okay with cardiology. 3. Emphysema:  Patient needs outpatient full PFTs after discharge from hospital. 4. Follow-up: Patient and wife instructed to arrange/call our office for a follow-up appointment with Dr. Lamonte Sakai after discharge from hospital.  PCCM will sign off. Please let me know if we can be of any further assistance to the patient.   Sonia Baller Ashok Cordia, M.D. [facility] Pulmonary & Critical Care Pager:  [phone] After 3pm or if no response, call [phone] 09/22/2016 8:44 AM

## 2016-09-22 NOTE — Discharge Instructions (Signed)
Information on my medicine - XARELTO (rivaroxaban)  This medication education was reviewed with me or my healthcare representative as part of my discharge preparation.  The pharmacist that spoke with me during my hospital stay was:  Dareen Piano, [place]? Xarelto was prescribed to treat blood clots that may have been found in the veins of your legs (deep vein thrombosis) or in your lungs (pulmonary embolism) and to reduce the risk of them occurring again.  What do you need to know about Xarelto? The starting dose is one 15 mg tablet taken TWICE daily with food for the FIRST 21 DAYS then on 10/14/16 the dose is changed to one 20 mg tablet taken ONCE A DAY with your evening meal.  DO NOT stop taking Xarelto without talking to the health care provider who prescribed the medication.  Refill your prescription for 20 mg tablets before you run out.  After discharge, you should have regular check-up appointments with your healthcare provider that is prescribing your Xarelto.  In the future your dose may need to be changed if your kidney function changes by a significant amount.  What do you do if you miss a dose? If you are taking Xarelto TWICE DAILY and you miss a dose, take it as soon as you remember. You may take two 15 mg tablets (total 30 mg) at the same time then resume your regularly scheduled 15 mg twice daily the next day.  If you are taking Xarelto ONCE DAILY and you miss a dose, take it as soon as you remember on the same day then continue your regularly scheduled once daily regimen the next day. Do not take two doses of Xarelto at the same time.   Important Safety Information Xarelto is a blood thinner medicine that can cause bleeding. You should call your healthcare provider right away if you experience any of the following: ? Bleeding from an injury or your nose that does not stop. ? Unusual colored urine (red or dark brown) or unusual  colored stools (red or black). ? Unusual bruising for unknown reasons. ? A serious fall or if you hit your head (even if there is no bleeding).  Some medicines may interact with Xarelto and might increase your risk of bleeding while on Xarelto. To help avoid this, consult your healthcare provider or pharmacist prior to using any new prescription or non-prescription medications, including herbals, vitamins, non-steroidal anti-inflammatory drugs (NSAIDs) and supplements.  This website has more information on Xarelto: [email or web address]

## 2016-09-22 NOTE — Progress Notes (Signed)
Patient Name: Nicholas Hale Date of Encounter: 09/22/2016  Primary Cardiologist: New- Dr. [facility] Problem List     Principal Problem:   Recurrent pulmonary embolism [facility]) Active Problems:   Hypertension   Hyperlipidemia   Primary osteoarthritis of right hip   Pericardial effusion   Ventricular tachycardia (HCC)   Renal cyst, right   Liver lesion   Nonsustained ventricular tachycardia (HCC)   Elevated troponin   Cardiac tamponade     Subjective   Feels much better post pericardiocentesis. Dyspnea resolved. No chest pain.   Inpatient Medications    Scheduled Meds: . aspirin  81 mg Oral Daily  . atorvastatin  20 mg Oral q1800  . colchicine  0.6 mg Oral BID  . sodium chloride flush  3 mL Intravenous Q12H  . sodium chloride flush  3 mL Intravenous Q12H  . sodium chloride flush  3 mL Intravenous Q12H   Continuous Infusions: . sodium chloride 75 mL/hr at 09/22/16 0900  . heparin 1,300 Units/hr (09/22/16 0900)   PRN Meds: sodium chloride, sodium chloride, acetaminophen **OR** acetaminophen, albuterol, dextromethorphan-guaiFENesin, HYDROcodone-acetaminophen, morphine injection, ondansetron, sodium chloride flush, sodium chloride flush, zolpidem   Vital Signs    Vitals:   09/22/16 0500 09/22/16 0600 09/22/16 0700 09/22/16 0800  BP: 125/70 101/66 105/68 119/73  Pulse: 79 82 63 80  Resp: '17 10 10 16  '$ Temp:    98.1 F (36.7 C)  TempSrc:    Oral  SpO2: 97% 96% 96% 95%  Weight:      Height:        Intake/Output Summary (Last 24 hours) at 09/22/16 0957 Last data filed at 09/22/16 0900  Gross per 24 hour  Intake           3097.7 ml  Output             2005 ml  Net           1092.7 ml   Filed Weights   09/19/16 1555 09/19/16 2109 09/21/16 1107  Weight: 200 lb (90.7 kg) 202 lb 13.2 oz (92 kg) 203 lb 11.3 oz (92.4 kg)    Physical Exam    GEN: Well nourished, well developed, NAD HEENT: Grossly normal.  Neck: Supple, no JVD, carotid bruits, or  masses. Cardiac: RRR, no murmurs, rubs, or gallops. No clubbing, cyanosis, edema.  Radials/DP/PT 2+ and equal bilaterally. Pericardial drain in place. Draining serosanguinous fluid.  Respiratory:  Respirations regular and unlabored, clear to auscultation bilaterally. GI: Soft, nontender, nondistended, BS + x 4. MS: no deformity or atrophy. Skin: warm and dry, no rash. Neuro:  Strength and sensation are intact. Psych: AAOx3.  Normal affect.  Labs    CBC  Recent Labs  09/21/16 0246 09/22/16 0513  WBC 10.0 8.8  HGB 15.3 15.0  HCT 45.8 44.8  MCV 95.0 94.7  PLT 116* 932*   Basic Metabolic Panel  Recent Labs  09/19/16 1930  09/21/16 0246 09/22/16 0829  NA  --   < > 137 139  K  --   < > 4.0 4.0  CL  --   < > 107 107  CO2  --   < > 20* 24  GLUCOSE  --   < > 125* 85  BUN  --   < > 18 14  CREATININE  --   < > 1.03 1.02  CALCIUM  --   < > 8.5* 8.1*  MG 2.0  --   --   --   < > =  values in this interval not displayed. Liver Function Tests  Recent Labs  09/21/16 0246  AST 39  ALT 69*  ALKPHOS 65  BILITOT 0.8  PROT 6.3*  ALBUMIN 3.3*   No results for input(s): LIPASE, AMYLASE in the last 72 hours. Cardiac Enzymes  Recent Labs  09/19/16 2331 09/20/16 0533 09/20/16 1441  TROPONINI 0.03* <0.03 <0.03   BNP Invalid input(s): POCBNP D-Dimer No results for input(s): DDIMER in the last 72 hours. Hemoglobin A1C  Recent Labs  09/20/16 0533  HGBA1C 5.8*   Fasting Lipid Panel  Recent Labs  09/20/16 0533  CHOL 94  HDL 23*  LDLCALC 50  TRIG 106  CHOLHDL 4.1   Thyroid Function Tests  Recent Labs  09/20/16 0533  TSH 3.478    Telemetry    NSR  - Personally Reviewed  ECG     none today  Radiology    Mr Abdomen W Wo Contrast  Result Date: 09/20/2016 CLINICAL DATA:  70 year old male with history of right renal cyst and indeterminate lesion in the left lobe of the liver. Followup study. EXAM: MRI ABDOMEN WITHOUT AND WITH CONTRAST TECHNIQUE:  Multiplanar multisequence MR imaging of the abdomen was performed both before and after the administration of intravenous contrast. CONTRAST:  26m MULTIHANCE GADOBENATE DIMEGLUMINE 529 MG/ML IV SOLN COMPARISON:  No prior abdominal MRI.  CT of the chest 09/19/2016. FINDINGS: Lower chest: Small right pleural effusion. Signal intensity in the dependent right lower lobe likely reflects areas of subsegmental atelectasis. Moderate pericardial effusion. Hepatobiliary: The lesion of concern in the periphery of segment 4A of the liver measures 13 mm in diameter and is T1 hypointense, T2 hyperintense, and does not enhance, compatible with a simple hepatic cyst. No suspicious hepatic lesions are noted. No intra or extrahepatic biliary ductal dilatation. Gallbladder is unremarkable in appearance. Pancreas: No pancreatic mass. No pancreatic ductal dilatation. No pancreatic or peripancreatic fluid or inflammatory changes. Spleen:  Unremarkable. Adrenals/Urinary Tract: Within the kidneys bilaterally there are several T1 hypointense, T2 hyperintense, nonenhancing lesions, compatible with simple cysts. In addition, there is a very large exophytic lesion extending off the lower pole of the right kidney which measures up to 13.7 x 10.3 x 11.7 cm (axial image 36 of series 7 and coronal image 26 of series 4), which has 3 thin internal septations which do not demonstrate significant thickening or enhancement, and demonstrates no mural nodularity or other internal enhancement, compatible with a mildly complex cyst (Bosniak class 2). No other suspicious renal lesions are noted. No hydroureteronephrosis. Bilateral adrenal glands are normal in appearance. Stomach/Bowel: Visualized portions are unremarkable. Vascular/Lymphatic: Aortic atherosclerosis, with aneurysmal dilatation of the infrarenal abdominal aorta which measures up to 3.6 x 3.0 cm (image 108 of series 1101) where there is some eccentric nonenhancing material, compatible with a  combination of atheromatous plaque and/or mural thrombus. No lymphadenopathy noted in the abdomen. Other: Perinephric stranding bilaterally (nonspecific). No significant volume of ascites. Musculoskeletal: No aggressive osseous lesions are noted in the visualized portions of the skeleton. IMPRESSION: 1. The lesion of concern in the liver is compatible with a small simple cyst. This is a benign finding. 2. 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 3. Infrarenal abdominal aortic aneurysm measuring up to 3.0 x 3.6 cm (mean diameter of 3.3 cm). Recommend followup by ultrasound in 3 years. This  recommendation follows ACR consensus guidelines: White Paper of the ACR Incidental Findings Committee II on Vascular Findings. J Am Coll Radiol 2013; 10:789-794. 4. Moderate pericardial effusion. 5. Small right pleural effusion with some passive atelectasis in the right lower lobe. Electronically Signed   By: Vinnie Langton M.D.   On: 09/20/2016 13:19   Dg Chest Port 1 View  Result Date: 09/21/2016 CLINICAL DATA:  Pericardiocentesis . EXAM: PORTABLE CHEST 1 VIEW COMPARISON:  CT 09/19/2016 FINDINGS: Drainage catheter noted over the lower chest, presumably of pericardial drainage catheter. Mediastinum and hilar structures are normal. Mild right lower lobe infiltrate noted. Small right pleural effusion. No pneumothorax. Heart size normal. Cervical spine fusion . IMPRESSION: 1. Drainage catheter noted over the lower mid chest, presumably a pericardial drainage catheter. No evidence of cardiomegaly. No pneumothorax. 2. Mild infiltrate right lung base.  Mild right pleural effusion . Electronically Signed   By: Marcello Moores  Register   On: 09/21/2016 11:57    Cardiac Studies   2D ECHO: 09/20/2016 LV EF: 60% -   65% Study Conclusions - Left ventricle:  The cavity size was normal. Wall thickness was   normal. Systolic function was normal. The estimated ejection   fraction was in the range of 60% to 65%. Wall motion was normal;   there were no regional wall motion abnormalities. Doppler   parameters are consistent with abnormal left ventricular   relaxation (grade 1 diastolic dysfunction). - Pericardium, extracardiac: A large pericardial effusion was   identified circumferential to the heart. Impressions: - There is a large pericardial effusion with signs of early   tamponade   There is invagination of the RV and RA.   There is increased respiratory variability of the MV inflow.   The IVC is at the upper limits of normal and does not collapse   with sniff.   The patient also has a pulmonary embolus and a mobile thrombus in   his right calf.   The plan is to treat with IV heparin overnight and recheck echo   and venous duplex tomorrow   I would prefer to reduce the thrombus burden before we hold the   heparin for pericardiocentesis.  Bilateral lower extremity venous duplex complete. There is evidence of deep vein thrombosis involving the right and left lower extremities. There is acute, mobile thrombosis in the right soleal vein. There is also acute thrombosis in the right gastrocnemius vein and proximal to mid segments of the peroneal veins. The left peroneal veins also have acute thrombosis in the distal segments. There is no evidence of superficial vein thrombosis involving the right and left lower extremities.  There is no evidence of Baker's cysts bilaterally. Results were given to the patient's nurse, Janett Billow.  09/21/16 3:44 PM Carlos Levering RVT  Patient Profile     69 YO male with history of previous DVT/PE s/p EKOS (8 weeks post op, completed 1 yr Eliquis then d/c), AAA, HLD, HTN who presented to [facility] on 09/19/16 with 3 days of shortness of breath. He was found to have several bilateral pulmonary emboli with right pleural  effusion, large  pericardial effusion and NSVT. S/p pericardiocentesis on 10/23 for tamponade.  Assessment & Plan    Recurrent PE: with evidence of right heart strain on CT scan and mildly elevated troponin (0.04). Started on heparin which was been continued for ~ 48 hours. Held for procedure yesterday then resumed. Also bilateral DVTs.   He will need lifelong anticoagulation. Plan to start Xarelto after pericardial  drain removed.   Large pericardial effusion with tamponade: 2D ECHO yesterday showed there is a large pericardial effusion with signs of early tamponade. S/p pericardiocentesis yesterday with 850 cc serosanguinous fluid removed. Only 50 cc drained last night. Will check limited Echo today. Plan to remove pericardial drain if Echo is OK. Initial fluid studies nonspecific. Does not look infectious. Cytology pending. CRP mildly elevated at 2.5. Sed rate 0. Will start colchicine for anti-inflammatory effects.   RQS:XQKSKS.  HLD: continue statin   NSVT: resolved.  Signed, Alexes Lamarque [place], MD, [facility] 09/22/2016, 9:57 AM

## 2016-09-22 NOTE — Progress Notes (Signed)
  Echocardiogram 2D Echocardiogram limited has been performed.  Tresa Res 09/22/2016, 1:43 PM

## 2016-09-23 ENCOUNTER — Telehealth: Payer: Self-pay | Admitting: *Deleted

## 2016-09-23 ENCOUNTER — Other Ambulatory Visit: Payer: Self-pay | Admitting: Physician Assistant

## 2016-09-23 DIAGNOSIS — I313 Pericardial effusion (noninflammatory): Secondary | ICD-10-CM

## 2016-09-23 DIAGNOSIS — I3139 Other pericardial effusion (noninflammatory): Secondary | ICD-10-CM

## 2016-09-23 LAB — BASIC METABOLIC PANEL
Anion gap: 8 (ref 5–15)
BUN: 7 mg/dL (ref 6–20)
CO2: 25 mmol/L (ref 22–32)
CREATININE: 0.92 mg/dL (ref 0.61–1.24)
Calcium: 8.3 mg/dL — ABNORMAL LOW (ref 8.9–10.3)
Chloride: 107 mmol/L (ref 101–111)
GFR calc Af Amer: >60 mL/min (ref >60)
GFR calc non Af Amer: >60 mL/min (ref >60)
GLUCOSE: 93 mg/dL (ref 65–99)
POTASSIUM: 3.8 mmol/L (ref 3.5–5.1)
SODIUM: 140 mmol/L (ref 135–145)

## 2016-09-23 LAB — GLUCOSE, CAPILLARY: Glucose-Capillary: 99 mg/dL (ref 65–99)

## 2016-09-23 MED ORDER — COLCHICINE 0.6 MG PO TABS: 0.6000 mg | ORAL_TABLET | Freq: Two times a day (BID) | ORAL | 0 refills | Status: DC

## 2016-09-23 MED ORDER — HYDROCODONE-ACETAMINOPHEN 5-325 MG PO TABS: 1.0000 | ORAL_TABLET | Freq: Two times a day (BID) | ORAL | 0 refills | Status: DC | PRN

## 2016-09-23 MED ORDER — RIVAROXABAN (XARELTO) VTE STARTER PACK (15 & 20 MG): ORAL_TABLET | ORAL | 0 refills | Status: DC

## 2016-09-23 MED ORDER — DM-GUAIFENESIN ER 30-600 MG PO TB12: 1.0000 | ORAL_TABLET | Freq: Two times a day (BID) | ORAL | 0 refills | Status: DC

## 2016-09-23 MED ORDER — ATORVASTATIN CALCIUM 20 MG PO TABS: 20.0000 mg | ORAL_TABLET | Freq: Every day | ORAL | 0 refills | Status: DC

## 2016-09-23 NOTE — Progress Notes (Signed)
Patient Name: Nicholas Hale Date of Encounter: 09/23/2016  Primary Cardiologist: New- Dr. [facility] Problem List     Principal Problem:   Recurrent pulmonary embolism [facility]) Active Problems:   Hypertension   Hyperlipidemia   Primary osteoarthritis of right hip   Pericardial effusion   Ventricular tachycardia (HCC)   Renal cyst, right   Liver lesion   Nonsustained ventricular tachycardia (HCC)   Elevated troponin   Cardiac tamponade   Acute deep vein thrombosis (DVT) of proximal vein of both lower extremities (HCC)     Subjective   Feels much better post pericardiocentesis. Dyspnea resolved. No chest pain.   Inpatient Medications    Scheduled Meds: . aspirin  81 mg Oral Daily  . atorvastatin  20 mg Oral q1800  . colchicine  0.6 mg Oral BID  . rivaroxaban  15 mg Oral BID WC   Followed by  . [START ON 10/14/2016] rivaroxaban  20 mg Oral Q supper   Continuous Infusions:   PRN Meds: acetaminophen **OR** acetaminophen, albuterol, dextromethorphan-guaiFENesin, HYDROcodone-acetaminophen, morphine injection, ondansetron, zolpidem   Vital Signs    Vitals:   09/22/16 2000 09/22/16 2030 09/22/16 2111 09/23/16 0445  BP: 137/67 (!) 151/70 129/72 (!) 143/77  Pulse: 86 89  88  Resp: (!) '22  17 19  '$ Temp: 97.6 F (36.4 C) 98.4 F (36.9 C)  98.7 F (37.1 C)  TempSrc: Oral Oral  Oral  SpO2: 97% 92%  91%  Weight:  206 lb 4.8 oz (93.6 kg)  201 lb 9.6 oz (91.4 kg)  Height:  '5\' 11"'$  (1.803 m)      Intake/Output Summary (Last 24 hours) at 09/23/16 0922 Last data filed at 09/23/16 0500  Gross per 24 hour  Intake              644 ml  Output             1840 ml  Net            -1196 ml   Filed Weights   09/21/16 1107 09/22/16 2030 09/23/16 0445  Weight: 203 lb 11.3 oz (92.4 kg) 206 lb 4.8 oz (93.6 kg) 201 lb 9.6 oz (91.4 kg)    Physical Exam    GEN: Well nourished, well developed, NAD HEENT: Grossly normal.  Neck: Supple, no JVD, carotid bruits, or  masses. Cardiac: RRR, no murmurs, rubs, or gallops. No clubbing, cyanosis, edema.  Radials/DP/PT 2+ and equal bilaterally. Site of pericardial drain has healed well. Respiratory:  Respirations regular and unlabored, clear to auscultation bilaterally. GI: Soft, nontender, nondistended, BS + x 4. MS: no deformity or atrophy. Skin: warm and dry, no rash. Neuro:  Strength and sensation are intact. Psych: AAOx3.  Normal affect.  Labs    CBC  Recent Labs  09/21/16 0246 09/22/16 0513  WBC 10.0 8.8  HGB 15.3 15.0  HCT 45.8 44.8  MCV 95.0 94.7  PLT 116* 614*   Basic Metabolic Panel  Recent Labs  09/22/16 0829 09/23/16 0557  NA 139 140  K 4.0 3.8  CL 107 107  CO2 24 25  GLUCOSE 85 93  BUN 14 7  CREATININE 1.02 0.92  CALCIUM 8.1* 8.3*   Liver Function Tests  Recent Labs  09/21/16 0246  AST 39  ALT 69*  ALKPHOS 65  BILITOT 0.8  PROT 6.3*  ALBUMIN 3.3*   No results for input(s): LIPASE, AMYLASE in the last 72 hours. Cardiac Enzymes  Recent Labs  09/20/16 1441  TROPONINI <0.03   BNP Invalid input(s): POCBNP D-Dimer No results for input(s): DDIMER in the last 72 hours. Hemoglobin A1C No results for input(s): HGBA1C in the last 72 hours. Fasting Lipid Panel No results for input(s): CHOL, HDL, LDLCALC, TRIG, CHOLHDL, LDLDIRECT in the last 72 hours. Thyroid Function Tests No results for input(s): TSH, T4TOTAL, T3FREE, THYROIDAB in the last 72 hours.  Invalid input(s): FREET3  Telemetry    NSR with one PVC triplet  - Personally Reviewed  ECG     none today  Radiology    Dg Chest Port 1 View  Result Date: 09/21/2016 CLINICAL DATA:  Pericardiocentesis . EXAM: PORTABLE CHEST 1 VIEW COMPARISON:  CT 09/19/2016 FINDINGS: Drainage catheter noted over the lower chest, presumably of pericardial drainage catheter. Mediastinum and hilar structures are normal. Mild right lower lobe infiltrate noted. Small right pleural effusion. No pneumothorax. Heart size normal.  Cervical spine fusion . IMPRESSION: 1. Drainage catheter noted over the lower mid chest, presumably a pericardial drainage catheter. No evidence of cardiomegaly. No pneumothorax. 2. Mild infiltrate right lung base.  Mild right pleural effusion . Electronically Signed   By: Marcello Moores  Register   On: 09/21/2016 11:57    Cardiac Studies   2D ECHO: 09/20/2016 LV EF: 60% -   65% Study Conclusions - Left ventricle: The cavity size was normal. Wall thickness was   normal. Systolic function was normal. The estimated ejection   fraction was in the range of 60% to 65%. Wall motion was normal;   there were no regional wall motion abnormalities. Doppler   parameters are consistent with abnormal left ventricular   relaxation (grade 1 diastolic dysfunction). - Pericardium, extracardiac: A large pericardial effusion was   identified circumferential to the heart. Impressions: - There is a large pericardial effusion with signs of early   tamponade   There is invagination of the RV and RA.   There is increased respiratory variability of the MV inflow.   The IVC is at the upper limits of normal and does not collapse   with sniff.   The patient also has a pulmonary embolus and a mobile thrombus in   his right calf.   The plan is to treat with IV heparin overnight and recheck echo   and venous duplex tomorrow   I would prefer to reduce the thrombus burden before we hold the   heparin for pericardiocentesis.  Bilateral lower extremity venous duplex complete. There is evidence of deep vein thrombosis involving the right and left lower extremities. There is acute, mobile thrombosis in the right soleal vein. There is also acute thrombosis in the right gastrocnemius vein and proximal to mid segments of the peroneal veins. The left peroneal veins also have acute thrombosis in the distal segments. There is no evidence of superficial vein thrombosis involving the right and left lower extremities.  There is no  evidence of Baker's cysts bilaterally. Results were given to the patient's nurse, Janett Billow.  09/21/16 3:44 PM Carlos Levering RVT  Patient Profile     70 YO male with history of previous DVT/PE s/p EKOS (8 weeks post op, completed 1 yr Eliquis then d/c), AAA, HLD, HTN who presented to [facility] on 09/19/16 with 3 days of shortness of breath. He was found to have several bilateral pulmonary emboli with right pleural effusion, large  pericardial effusion and NSVT. S/p pericardiocentesis on 10/23 for tamponade.  Assessment & Plan    Recurrent PE: with evidence of right heart strain on CT  scan and mildly elevated troponin (0.04).  Also bilateral DVTs.   He will need lifelong anticoagulation. No on Xarelto. Heparin stopped yesterday.  Large pericardial effusion with tamponade: 2D ECHO yesterday showed there is a large pericardial effusion with signs of early tamponade. S/p pericardiocentesis  with 850 cc serosanguinous fluid removed. Limited Echo yesterday showed no effusion and pericardial drain removed.  Initial fluid studies nonspecific. Does not look infectious. Cultures negative so far.  Cytology pending. CRP mildly elevated at 2.5. Sed rate 0. Will continue colchicine for anti-inflammatory effects for 3 months.   RPR:[id number].  HLD: continue statin   Patient is stable for DC from a cardiac standpoint. Will plan follow up in a couple of weeks with a limited Echo.   Signed, Avyana Puffenbarger [place], MD, [facility] 09/23/2016, 9:22 AM

## 2016-09-23 NOTE — Discharge Summary (Signed)
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 Follow-up with PCP in one week 2. Follow-up with cardiology as recommended  3. Addendum, follow-up with cardiology, pulmonary, oncology in 1 week time  Follow-up [place], MD. Schedule an appointment as soon as possible for a visit in 1 month(s).   Specialty:  Family Medicine Contact information: Nicholas Hale [address] [phone]        Nicholas Martinique, MD. Call in 1 month(s).   Specialty:  Cardiology Contact information: [address] Nicholas Hale [address] [phone]          Diet recommendation: Cardiac diet  Activity: The patient is advised to gradually reintroduce usual activities.  Discharge Condition: good  Code Status: Full code  History of present illness: As per the H and P dictated on admission, "Nicholas Hale is a 70 y.o. male with medical history significant of DVT/PE s/p EKOS (8 weeks post op, completed 1 yr Eliquid then d/c) hypertension, hyperlipidemia, AAA (stable, 3.7 cm by ultrasound on 01/10/15), large right renal cyst, arthritis in hip and  back, who presents with shortness of breath.  Patient states that he has been having shortness of breath for 3 days, which is  exertional. He denies chest pain. He has cough with clear mucus production. No fever or chills. He states that he has been having bilateral calf pain for several days. Patient denies nausea, vomiting, abdominal pain, diarrhea, symptoms of UTI or unilateral weakness. In ED, pt has had some short runs of NSVT while being monitored on telemetry.  ED Course: pt was found to have troponin 0.04, WBC 8.9, electrolytes renal function okay, temperature normal, tachycardia, tachypnea, oxygen saturation 96% on room air. CT scan showed several bilateral pulmonary emboli with right pleural effusion, moderate pericardial effusion and RV/LV ratio of 0.99. Patient is admitted to stepdown bed as inpatient. Cardiology, Nicholas Hale was consulted."  Hospital Course:  Summary of his active problems in the hospital is as following. 1. Acute Bilateral PE and DVT -Initially on IV heparin -h/o EKOS followed by Eliquis 2years back for PE, stopped Eliquis 1 year ago. -saw hematologist in the past and tested negative for hypercoagulable disorders Discharge on Xarelto  2. Large Pericardial effusion on CTA -early tamponade physiology on ECHO noted 10/22 afternoon -underwent urgent Pericardiocentesis 10/23 am and 850cc serosanguinous fluid drained, blood tinged now -Cards following, repeat echo unremarkable, but the patient admits if discharged home. -Negative culture and follow-up cytology  3. Renal cyst, right and Liver lesion:pt had hx of a large right renal cyst (12 cm by [place] on 01/10/15). He was found to have a 1.2 cm hypodensity over the periphery of the left lobe by CTA today. Not sure if his large renal cyst could have compressed venous  return, contributing to DVT formation. -MRI notes liver and renal cysts  4. HTN: -not on medications at home. -continue IVF today  5. 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 I discussed with oncology to arrange a follow-up for the patient. I discussed with patient regarding this findings as well as discussed with patient's wife regarding further treatment plan. I discussed with cardiology who recommended that the patient can have a earlier follow-up in one week to see whether the effusion is coming back, if the effusion is coming back then patient may require an IVC filter placement for his PE. I discussed with pulmonary who will follow up with the patient on Friday to see how he is doing and regarding further plan of care. Informed patient to monitor for more shortness of breath and dizziness. All questions were answered satisfactorily.  Nicholas Hale 8:13 PM 09/23/2016    All other chronic medical condition were stable during the hospitalization.  Patient was ambulatory without any assistance. On the day of the discharge the patient's oxygenation was stable on both rest as well as on exertion after my personal evaluation, and no other acute medical condition were reported by patient. the patient was felt safe to be discharge at home with family.  Procedures and Results:  Pericardiocentesis  Echocardiogram   Consultations:  Pulmonary  Cardiology  DISCHARGE MEDICATION: Discharge Medication List as of 09/23/2016  1:16 PM    START taking these medications   Details  dextromethorphan-guaiFENesin (MUCINEX DM) 30-600 MG 12hr tablet Take 1 tablet by mouth 2 (two) times daily., Starting Wed 09/23/2016, Normal    Rivaroxaban 15 & 20 MG TBPK Take as directed on package: Start with one '15mg'$  tablet by mouth twice a day with food. On Day 22, switch to one '20mg'$  tablet once a day with food., Print      CONTINUE these medications which have CHANGED   Details  atorvastatin (LIPITOR) 20 MG tablet Take 1 tablet (20 mg total) by mouth daily at 6 PM., Starting Wed 09/23/2016, Normal    colchicine 0.6  MG tablet Take 1 tablet (0.6 mg total) by mouth 2 (two) times daily., Starting Wed 09/23/2016, Normal      CONTINUE these medications which have NOT CHANGED   Details  aspirin 81 MG tablet Take 81 mg by mouth daily., Historical Med      STOP taking these medications     HYDROcodone-acetaminophen (NORCO/VICODIN) 5-325 MG tablet        No Known Allergies Discharge Instructions    Ambulatory referral to Hematology / Oncology    Complete by:  As directed    Diet - low sodium heart healthy    Complete by:  As directed    Discharge instructions    Complete by:  As directed    It is important that you read following instructions as well as go over your medication list with RN to help you understand your care after this hospitalization.  Discharge Instructions: Please follow-up with PCP in one week  Please request your primary care physician to go over all Hospital Tests and Procedure/Radiological results at the follow up,  Please get all Hospital records sent to your PCP by signing hospital release before you go home.   Do not take more than prescribed Pain, Sleep and Anxiety Medications. You were cared for by a hospitalist during your hospital stay.  If you have any questions about your discharge medications or the care you received while you were in the hospital after you are discharged, you can call the unit and ask to speak with the hospitalist on call if the hospitalist that took care of you is not available.  Once you are discharged, your primary care physician will handle any further medical issues. Please note that NO REFILLS for any discharge medications will be authorized once you are discharged, as it is imperative that you return to your primary care physician (or establish a relationship with a primary care physician if you do not have one) for your aftercare needs so that they can reassess your need for medications and monitor your lab values. You Must read complete  instructions/literature along with all the possible adverse reactions/side effects for all the Medicines you take and that have been prescribed to you. Take any new Medicines after you have completely understood and accept all the possible adverse reactions/side effects. Wear Seat belts while driving. If you have smoked or chewed Tobacco in the last 2 yrs please stop smoking and/or stop any Recreational drug use.   Increase activity slowly    Complete by:  As directed      Discharge Exam: Filed Weights   09/21/16 1107 09/22/16 2030 09/23/16 0445  Weight: 92.4 kg (203 lb 11.3 oz) 93.6 kg (206 lb 4.8 oz) 91.4 kg (201 lb 9.6 oz)   Vitals:   09/22/16 2111 09/23/16 0445  BP: 129/72 (!) 143/77  Pulse:  88  Resp: 17 19  Temp:  98.7 F (37.1 C)   General: Appear in no distress, no Rash; Oral Mucosa moist. Cardiovascular: S1 and S2 Present, no Murmur, no JVD Respiratory: Bilateral Air entry present and Clear to Auscultation, no Crackles, no wheezes Abdomen: Bowel Sound present, Soft and no tenderness Extremities: no Pedal edema, no calf tenderness Neurology: Grossly no focal neuro deficit.  The results of significant diagnostics from this hospitalization (including imaging, microbiology, ancillary and laboratory) are listed below for reference.    Significant Diagnostic Studies: Dg Chest 2 View  Result Date: 09/19/2016 CLINICAL DATA:  Shortness of breath with exertion EXAM: CHEST  2 VIEW COMPARISON:  05/06/2015 FINDINGS: Cardiac shadow is stable. The lungs are well aerated bilaterally. No focal infiltrate or sizable effusion is seen. Degenerative changes of the thoracic spine are noted. IMPRESSION: No active cardiopulmonary disease. Electronically Signed   By: Inez Catalina M.D.   On: 09/19/2016 16:18   Ct Angio Chest Pe W And/or Wo Contrast  Addendum Date: 09/19/2016   ADDENDUM REPORT: 09/19/2016 18:49 ADDENDUM: Recommend followup CT chest 6 months to document stability of the 8 mm  pleural-based nodule left lower lobe. Electronically Signed   By: Marin Olp M.D.   On: 09/19/2016 18:49   Result Date: 09/19/2016 CLINICAL DATA:  Shortness of breath.  History of PD 2015. EXAM: CT ANGIOGRAPHY CHEST WITH CONTRAST TECHNIQUE: Multidetector CT imaging of the chest was performed using the standard protocol during bolus administration of intravenous contrast. Multiplanar CT image reconstructions and MIPs were obtained to evaluate the vascular anatomy. CONTRAST:  100 mL Isovue 370 IV. COMPARISON:  08/08/2014 and 08/05/2014 FINDINGS: Cardiovascular: Examination demonstrates the heart to be normal in size as there is a moderate size pericardial effusion. Thoracic aorta is otherwise within normal. Pulmonary arterial system demonstrates several tiny bilateral pulmonary emboli. RV/LV ratio is 0.99. Tiny focus of calcification along the left anterior descending coronary artery. Mediastinum/Nodes: There are a few shotty  mediastinal lymph nodes. There is a 1 cm right hilar lymph node. Remaining mediastinal structures are unremarkable. Lungs/Pleura: Lungs are adequately inflated demonstrate mild paraseptal and centrilobular emphysematous change over the mid to upper lungs. 8 mm pleural based nodule opacity over the left lower lobe. Small right pleural effusion with associated atelectasis. Airways are within normal. Upper Abdomen: Partially visualized 3.4 cm cyst over the upper pole right kidney. 1.2 cm hypodensity along the periphery of the left lobe of the liver. Colonic diverticulosis. Musculoskeletal: Degenerative change of the spine. Review of the MIP images confirms the above findings. IMPRESSION: Several tiny bilateral pulmonary emboli. Small associated right pleural effusion and right basilar atelectasis. RV/LV ratio 0.99 compatible with intermediate risk pulmonary embolism. The presence of right heart strain has been associated with an increased risk of morbidity and mortality. Moderate size  pericardial effusion. Mild emphysematous disease. 1.2 cm hypodensity over the periphery of the left lobe of the liver not well seen previously. Consider MRI on elective basis for further evaluation. Right renal cyst. Critical Value/emergent results were called by telephone at the time of interpretation on 09/19/2016 at 5:42 pm to Dr. Davonna Belling , who verbally acknowledged these results. Electronically Signed: By: Marin Olp M.D. On: 09/19/2016 17:42   Mr Abdomen W Wo Contrast  Result Date: 09/20/2016 CLINICAL DATA:  70 year old male with history of right renal cyst and indeterminate lesion in the left lobe of the liver. Followup study. EXAM: MRI ABDOMEN WITHOUT AND WITH CONTRAST TECHNIQUE: Multiplanar multisequence MR imaging of the abdomen was performed both before and after the administration of intravenous contrast. CONTRAST:  32m MULTIHANCE GADOBENATE DIMEGLUMINE 529 MG/ML IV SOLN COMPARISON:  No prior abdominal MRI.  CT of the chest 09/19/2016. FINDINGS: Lower chest: Small right pleural effusion. Signal intensity in the dependent right lower lobe likely reflects areas of subsegmental atelectasis. Moderate pericardial effusion. Hepatobiliary: The lesion of concern in the periphery of segment 4A of the liver measures 13 mm in diameter and is T1 hypointense, T2 hyperintense, and does not enhance, compatible with a simple hepatic cyst. No suspicious hepatic lesions are noted. No intra or extrahepatic biliary ductal dilatation. Gallbladder is unremarkable in appearance. Pancreas: No pancreatic mass. No pancreatic ductal dilatation. No pancreatic or peripancreatic fluid or inflammatory changes. Spleen:  Unremarkable. Adrenals/Urinary Tract: Within the kidneys bilaterally there are several T1 hypointense, T2 hyperintense, nonenhancing lesions, compatible with simple cysts. In addition, there is a very large exophytic lesion extending off the lower pole of the right kidney which measures up to 13.7 x  10.3 x 11.7 cm (axial image 36 of series 7 and coronal image 26 of series 4), which has 3 thin internal septations which do not demonstrate significant thickening or enhancement, and demonstrates no mural nodularity or other internal enhancement, compatible with a mildly complex cyst (Bosniak class 2). No other suspicious renal lesions are noted. No hydroureteronephrosis. Bilateral adrenal glands are normal in appearance. Stomach/Bowel: Visualized portions are unremarkable. Vascular/Lymphatic: Aortic atherosclerosis, with aneurysmal dilatation of the infrarenal abdominal aorta which measures up to 3.6 x 3.0 cm (image 108 of series 1101) where there is some eccentric nonenhancing material, compatible with a combination of atheromatous plaque and/or mural thrombus. No lymphadenopathy noted in the abdomen. Other: Perinephric stranding bilaterally (nonspecific). No significant volume of ascites. Musculoskeletal: No aggressive osseous lesions are noted in the visualized portions of the skeleton. IMPRESSION: 1. The lesion of concern in the liver is compatible with a small simple cyst. This is a benign finding. 2. Multiple  benign appearing cysts in the kidneys bilaterally (Bosniak class 1 and 2), including a very large exophytic multilocular Bosniak class 2 cyst extending from the lower pole of the right kidney, as detailed above. 3. Infrarenal abdominal aortic aneurysm measuring up to 3.0 x 3.6 cm (Hale diameter of 3.3 cm). Recommend followup by ultrasound in 3 years. This recommendation follows ACR consensus guidelines: White Paper of the ACR Incidental Findings Committee II on Vascular Findings. J Am Coll Radiol 2013; 10:789-794. 4. Moderate pericardial effusion. 5. Small right pleural effusion with some passive atelectasis in the right lower lobe. Electronically Signed   By: Vinnie Langton M.D.   On: 09/20/2016 13:19   Dg Chest Port 1 View  Result Date: 09/21/2016 CLINICAL DATA:  Pericardiocentesis . EXAM:  PORTABLE CHEST 1 VIEW COMPARISON:  CT 09/19/2016 FINDINGS: Drainage catheter noted over the lower chest, presumably of pericardial drainage catheter. Mediastinum and hilar structures are normal. Mild right lower lobe infiltrate noted. Small right pleural effusion. No pneumothorax. Heart size normal. Cervical spine fusion . IMPRESSION: 1. Drainage catheter noted over the lower mid chest, presumably a pericardial drainage catheter. No evidence of cardiomegaly. No pneumothorax. 2. Mild infiltrate right lung base.  Mild right pleural effusion . Electronically Signed   By: Marcello Moores  Register   On: 09/21/2016 11:57    Microbiology: Recent Results (from the past 240 hour(s))  MRSA PCR Screening     Status: None   Collection Time: 09/20/16  2:08 AM  Result Value Ref Range Status   MRSA by PCR NEGATIVE NEGATIVE Final    Comment:        The GeneXpert MRSA Assay (FDA approved for NASAL specimens only), is one component of a comprehensive MRSA colonization surveillance program. It is not intended to diagnose MRSA infection nor to guide or monitor treatment for MRSA infections.   Culture, body fluid-bottle     Status: None (Preliminary result)   Collection Time: 09/21/16 10:05 AM  Result Value Ref Range Status   Specimen Description PERICARDIUM  Final   Special Requests NONE  Final   Culture NO GROWTH 2 DAYS  Final   Report Status PENDING  Incomplete  Gram stain     Status: None   Collection Time: 09/21/16 10:05 AM  Result Value Ref Range Status   Specimen Description PERICARDIUM  Final   Special Requests NONE  Final   Gram Stain   Final    FEW WBC PRESENT,BOTH PMN AND MONONUCLEAR NO ORGANISMS SEEN    Report Status 09/21/2016 FINAL  Final     Labs: CBC:  Recent Labs Lab 09/19/16 1615 09/20/16 0533 09/21/16 0246 09/22/16 0513  WBC 8.9 8.8 10.0 8.8  HGB 16.4 14.9 15.3 15.0  HCT 49.5 44.7 45.8 44.8  MCV 95.7 94.7 95.0 94.7  PLT PLATELETS APPEAR ADEQUATE 108* 116* 109*   Basic  Metabolic Panel:  Recent Labs Lab 09/19/16 1615 09/19/16 1930 09/20/16 0533 09/21/16 0246 09/22/16 0829 09/23/16 0557  NA 139  --  138 137 139 140  K 4.0  --  4.1 4.0 4.0 3.8  CL 106  --  109 107 107 107  CO2 26  --  21* 20* 24 25  GLUCOSE 106*  --  118* 125* 85 93  BUN 16  --  '14 18 14 7  '$ CREATININE 1.14  --  1.08 1.03 1.02 0.92  CALCIUM 8.9  --  8.3* 8.5* 8.1* 8.3*  MG  --  2.0  --   --   --   --  Liver Function Tests:  Recent Labs Lab 09/21/16 0246  AST 39  ALT 69*  ALKPHOS 65  BILITOT 0.8  PROT 6.3*  ALBUMIN 3.3*   No results for input(s): LIPASE, AMYLASE in the last 168 hours. No results for input(s): AMMONIA in the last 168 hours. Cardiac Enzymes:  Recent Labs Lab 09/19/16 1615 09/19/16 2331 09/20/16 0533 09/20/16 1441  TROPONINI 0.04* 0.03* <0.03 <0.03   BNP (last 3 results) No results for input(s): BNP in the last 8760 hours. CBG:  Recent Labs Lab 09/20/16 0807 09/21/16 0757 09/23/16 0618  GLUCAP 126* 119* 99   Time spent: 30 minutes  Signed:  Berle Mull  [facility] 09/23/2016 , 8:10 PM

## 2016-09-23 NOTE — Progress Notes (Addendum)
Pt ambulated with NT ~230f without difficulty. Pt's O2 sat remained >98% on RA. When patient got back to room - he stated "I feel quite winded." Pt sat down on side of bed and immediately felt better. O2 sat transiently dropped to 87% however it wasn't a good pleth reading as patient immediately came up to 98% so I'm not sure how accurate the 87% reading was. Pt currently sitting on side of bed, no complaints of shortness of breath. Dr. PPosey Prontotext paged and made aware. IS given to patient per order. Pt able to return demonstrate how to use IS effectively.

## 2016-09-23 NOTE — Consult Note (Signed)
           [facility] CM Primary Care Navigator  09/23/2016  Nicholas Hale May 26, 1946 [id number]   Patient seen at the bedside with wife Enid Derry) to identify discharge needs.  Patient shared that increased shortness of breath and elevated blood pressures had led to this admission. Plan for discharge is to go home per patient. Patient confirms that primary provider is Dr. Milagros Evener with [place] [facility].  Patient is independent with self care prior to admission and is able to drive. Transportation to doctors' appointments can be provided by himself or wife if needed as stated by patient.  Patient states using [place] [facility] to obtain medications with no difficulty. He does his own medication management at home using "pill box" system. Wife will be the primary caregiver at home if needed.  Patient had expressed understanding to call primary care provider's office once discharged for a post discharge follow-up appointment within a week or sooner if needs arise. Patient letter provided as a reminder.  Patient and wife denied any needs or concerns at this time.   For questions, please contact:  Dannielle Huh, BSN, RN- [facility] Primary Care Navigator  Telephone: [phone] [place]

## 2016-09-23 NOTE — Telephone Encounter (Signed)
Oncology Nurse Navigator Documentation  Oncology Nurse Navigator Flowsheets 09/23/2016  Referral date to RadOnc/MedOnc 09/23/2016  Navigator Encounter Type Introductory phone call;Telephone/Dr. [name] received a call regarding referral on Nicholas Hale.  I called Nicholas Hale and scheduled him to be seen with Dr. [name] on 10/05/16 arrive at 1:45.  Also, per Dr. [name] he would like cytology [facility] to [facility] and PDL 1 testing. I requested from [facility] pathology dept  Telephone Outgoing Call  Treatment Phase Pre-Tx/Tx Discussion  Barriers/Navigation Needs Coordination of Care  Interventions Coordination of Care  Coordination of Care Appts;Other  Acuity Level 2  Acuity Level 2 Assistance expediting appointments;Other  Time Spent with Patient 30

## 2016-09-23 NOTE — Care Management Important Message (Signed)
Important Message  Patient Details  Name: Nicholas Hale MRN: [id number] Date of Birth: 07-Jul-1946   Medicare Important Message Given:  Yes    Orbie Pyo 09/23/2016, 11:16 AM

## 2016-09-25 ENCOUNTER — Ambulatory Visit (INDEPENDENT_AMBULATORY_CARE_PROVIDER_SITE_OTHER): Payer: Medicare Other | Admitting: Adult Health

## 2016-09-25 ENCOUNTER — Encounter: Payer: Self-pay | Admitting: Adult Health

## 2016-09-25 VITALS — BP 122/78 | HR 78 | Temp 98.2°F | Ht 71.0 in | Wt 197.6 lb

## 2016-09-25 DIAGNOSIS — I2699 Other pulmonary embolism without acute cor pulmonale: Secondary | ICD-10-CM

## 2016-09-25 DIAGNOSIS — C3490 Malignant neoplasm of unspecified part of unspecified bronchus or lung: Secondary | ICD-10-CM | POA: Diagnosis not present

## 2016-09-25 HISTORY — DX: Malignant neoplasm of unspecified part of unspecified bronchus or lung: C34.90

## 2016-09-25 MED ORDER — RIVAROXABAN 20 MG PO TABS: 20.0000 mg | ORAL_TABLET | Freq: Every day | ORAL | 5 refills | Status: DC

## 2016-09-25 NOTE — Assessment & Plan Note (Signed)
Recurrent PE and DVT, now in the setting of newly diagnosed metastatic lung cancer (pericardial effusion + cytology for adenocarcinoma) . He will need lifelong anticoagulation. He discussed options with changing to Lovenox as this may be superior in the setting of underlying cancer. Vs remaining on Xarelto. , At this time patient would like to remain on Xarelto. He does have a 2-D echo next week to check for recurrent of a pericardial effusion. If pericardial effusion has recurred will need to  Consider IVC filter.   Case reviewed with Dr. Ashok Cordia.

## 2016-09-25 NOTE — Assessment & Plan Note (Signed)
Metastatic Lung cancer w/ pericardial effusion cytology positive for adenocarcinoma  Pt needs PET scan .   Oncology follow up has been set for 10/05/16  Plan  . Patient Instructions  Set up for PET scan.  Follow up for Echo next week.  Follow with Dr. Earlie Server as planned .  I will be in touch regarding Xarelto , continue for now.  Do not take any NSAIDS -aleve , advil , etc.  follow up Dr. Lamonte Sakai  In 4- 6 weeks and .As needed

## 2016-09-25 NOTE — Progress Notes (Signed)
Subjective:    Patient ID: Nicholas Hale, male    DOB: March 14, 1946, 70 y.o.   MRN: [id number]  HPI 70 year old male former smoker with previous pulmonary emboli and DVT in 2015, status post EKOS (completed 1 yr of Eliquis ) with recurrent PE and moderate pericardial effusion on 09/19/16 .  New dx lung cancer 08/2016 w/ pleural fluid cytology positive for adenocarcinoma.    TEST  10/23 - Pericardiocentesis:  Removal 860cc of serosanguineous fluid w/ drain left in place  STUDIES:  CTA chest 10/21: Several tiny bilateral pulmonary emboli. Small associated right pleural effusion and right basilar atelectasis. RV/LV ratio 0.99 compatible with intermediate risk pulmonary embolism.  Moderate size pericardial effusion. Mild emphysematous disease. 8 mm pleural based nodule opacity over the left lower lobe. Small right pleural effusion with associated atelectasis. BLE venous dopplers 10/22: There is acute, mobile thrombus noted in the right soleal vein. Acute DVT noted in the right gastrocnemius and peroneal veins and acute DVT noted in the left peroneal vein.   09/25/2016 Post Hospital follow up Extended OV  Patient returns for a post hospital follow-up. Patient was recently admitted October 21 of October 25 for recurrent PE/DVT. Patient had previous PE and DVT in 2015 8 weeks post op felt to be a provoked clot and was treated with 1 year of Eliquis. Patient presented to the emergency room with 3 days of exertional shortness of breath. He was found to have several bilateral pulmonary emboli with a right pleural effusion and a moderate pericardial effusion. Venous Doppler showed a an acute mobile thrombus noted in the right soleal vein. An acute DVT in the right gastrocnemius and peroneal veins. An acute DVT in the left peroneal vein. Initial 2-D echo showed a large pericardial effusion with signs of early tamponade.  Underwent a pericardiocentesis with 860 cc of serosanguineous fluid removed.  Follow-up 2-D echo on 09/22/2016 showed an EF of 55-60% no remaining pericardial effusion. Patient was started on heparin and transition to Xarelto prior to discharge. Unfortunately, patient cytology came back positive for adenocarcinoma. Patient has been set up with oncology with Dr. Earlie Server on 10/05/2016. He has a plans for a follow-up 2-D echo next week. We went over his test results and answered multiple questions. He and his wife have. He says since discharge he is starting to feel better. He has less dyspnea. He denies any cough, hemoptysis, chest pain, orthopnea, PND, or increased leg swelling.  He discussed with his new diagnosis of cancer that Lovenox may be superior anticoagulant in the setting of underlying active cancer . We discussed benefit /risk. He would like to stay with Xarelto for now.     Past Medical History:  Diagnosis Date  . Abdominal aortic aneurysm ([place])   . Arthritis    "hips; lower spine" (05/24/2014)  . Hyperlipidemia   . Hypertension   . PE (pulmonary embolism) 9/15  . Personal history of DVT (deep vein thrombosis) 9/15   Current Outpatient Prescriptions on File Prior to Visit  Medication Sig Dispense Refill  . aspirin 81 MG tablet Take 81 mg by mouth daily.    Marland Kitchen atorvastatin (LIPITOR) 20 MG tablet Take 1 tablet (20 mg total) by mouth daily at 6 PM. 30 tablet 0  . colchicine 0.6 MG tablet Take 1 tablet (0.6 mg total) by mouth 2 (two) times daily. 180 tablet 0  . dextromethorphan-guaiFENesin (MUCINEX DM) 30-600 MG 12hr tablet Take 1 tablet by mouth 2 (two) times daily. 12 tablet 0  .  Rivaroxaban 15 & 20 MG TBPK Take as directed on package: Start with one '15mg'$  tablet by mouth twice a day with food. On Day 22, switch to one '20mg'$  tablet once a day with food. 51 each 0   No current facility-administered medications on file prior to visit.      Review of Systems Constitutional:   No  weight loss, night sweats,  Fevers, chills,  +fatigue, or  lassitude.  HEENT:    No headaches,  Difficulty swallowing,  Tooth/dental problems, or  Sore throat,                No sneezing, itching, ear ache, nasal congestion, post nasal drip,   CV:  No chest pain,  Orthopnea, PND, swelling in lower extremities, anasarca, dizziness, palpitations, syncope.   GI  No heartburn, indigestion, abdominal pain, nausea, vomiting, diarrhea, change in bowel habits, loss of appetite, bloody stools.   Resp: No shortness of breath with exertion or at rest.  No excess mucus, no productive cough,  No non-productive cough,  No coughing up of blood.  No change in color of mucus.  No wheezing.  No chest wall deformity  Skin: no rash or lesions.  GU: no dysuria, change in color of urine, no urgency or frequency.  No flank pain, no hematuria   MS:  No joint pain or swelling.  No decreased range of motion.  No back pain.  Psych:  No change in mood or affect. No depression or anxiety.  No memory loss.         Objective:   Physical Exam  . Vitals:   09/25/16 1031  BP: 122/78  Pulse: 78  Temp: 98.2 F (36.8 C)  TempSrc: Oral  SpO2: 96%  Weight: 197 lb 9.6 oz (89.6 kg)  Height: '5\' 11"'$  (1.803 m)   GEN: A/Ox3; pleasant , NAD, well nourished    HEENT:  [place]/AT,  EACs-clear, TMs-wnl, NOSE-clear, THROAT-clear, no lesions, no postnasal drip or exudate noted.   NECK:  Supple w/ fair ROM; no JVD; normal carotid impulses w/o bruits; no thyromegaly or nodules palpated; no lymphadenopathy.    RESP  Clear  P & A; w/o, wheezes/ rales/ or rhonchi. no accessory muscle use, no dullness to percussion  CARD:  RRR, no m/r/g  , no peripheral edema, pulses intact, no cyanosis or clubbing.  GI:   Soft & nt; nml bowel sounds; no organomegaly or masses detected.   Musco: Warm bil, no deformities or joint swelling noted.   Neuro: alert, no focal deficits noted.    Skin: Warm, no lesions or rashes  [name] NP-C  [facility]  09/25/2016       Assessment & Plan:

## 2016-09-25 NOTE — Patient Instructions (Signed)
Set up for PET scan.  Follow up for Echo next week.  Follow with Dr. Earlie Server as planned .  I will be in touch regarding Xarelto , continue for now.  Do not take any NSAIDS -aleve , advil , etc.  follow up Dr. Lamonte Sakai  In 4- 6 weeks and .As needed

## 2016-09-26 LAB — CULTURE, BODY FLUID W GRAM STAIN -BOTTLE

## 2016-09-26 LAB — CULTURE, BODY FLUID-BOTTLE: CULTURE: NO GROWTH

## 2016-09-29 DIAGNOSIS — C3490 Malignant neoplasm of unspecified part of unspecified bronchus or lung: Secondary | ICD-10-CM | POA: Diagnosis not present

## 2016-09-29 DIAGNOSIS — I82403 Acute embolism and thrombosis of unspecified deep veins of lower extremity, bilateral: Secondary | ICD-10-CM | POA: Diagnosis not present

## 2016-09-29 DIAGNOSIS — I2699 Other pulmonary embolism without acute cor pulmonale: Secondary | ICD-10-CM | POA: Diagnosis not present

## 2016-09-29 DIAGNOSIS — E7800 Pure hypercholesterolemia, unspecified: Secondary | ICD-10-CM | POA: Diagnosis not present

## 2016-09-29 DIAGNOSIS — I313 Pericardial effusion (noninflammatory): Secondary | ICD-10-CM | POA: Diagnosis not present

## 2016-09-29 DIAGNOSIS — I10 Essential (primary) hypertension: Secondary | ICD-10-CM | POA: Diagnosis not present

## 2016-10-01 ENCOUNTER — Encounter: Payer: Self-pay | Admitting: Internal Medicine

## 2016-10-02 ENCOUNTER — Ambulatory Visit (HOSPITAL_COMMUNITY): Payer: Medicare Other | Attending: Cardiovascular Disease

## 2016-10-02 ENCOUNTER — Ambulatory Visit (INDEPENDENT_AMBULATORY_CARE_PROVIDER_SITE_OTHER): Payer: Medicare Other | Admitting: Internal Medicine

## 2016-10-02 ENCOUNTER — Encounter (HOSPITAL_COMMUNITY): Payer: Self-pay

## 2016-10-02 ENCOUNTER — Encounter: Payer: Self-pay | Admitting: Internal Medicine

## 2016-10-02 VITALS — BP 118/76 | HR 64 | Ht 71.0 in | Wt 198.8 lb

## 2016-10-02 DIAGNOSIS — I313 Pericardial effusion (noninflammatory): Secondary | ICD-10-CM | POA: Diagnosis not present

## 2016-10-02 DIAGNOSIS — I2699 Other pulmonary embolism without acute cor pulmonale: Secondary | ICD-10-CM

## 2016-10-02 DIAGNOSIS — Z87891 Personal history of nicotine dependence: Secondary | ICD-10-CM | POA: Diagnosis not present

## 2016-10-02 DIAGNOSIS — E785 Hyperlipidemia, unspecified: Secondary | ICD-10-CM | POA: Insufficient documentation

## 2016-10-02 DIAGNOSIS — Z85118 Personal history of other malignant neoplasm of bronchus and lung: Secondary | ICD-10-CM | POA: Diagnosis not present

## 2016-10-02 DIAGNOSIS — I714 Abdominal aortic aneurysm, without rupture: Secondary | ICD-10-CM | POA: Diagnosis not present

## 2016-10-02 DIAGNOSIS — I10 Essential (primary) hypertension: Secondary | ICD-10-CM | POA: Diagnosis not present

## 2016-10-02 DIAGNOSIS — I3139 Other pericardial effusion (noninflammatory): Secondary | ICD-10-CM

## 2016-10-02 DIAGNOSIS — I318 Other specified diseases of pericardium: Secondary | ICD-10-CM

## 2016-10-02 DIAGNOSIS — I3131 Malignant pericardial effusion in diseases classified elsewhere: Secondary | ICD-10-CM

## 2016-10-02 DIAGNOSIS — C801 Malignant (primary) neoplasm, unspecified: Secondary | ICD-10-CM

## 2016-10-02 LAB — ECHOCARDIOGRAM LIMITED
HEIGHTINCHES: 71 in
WEIGHTICAEL: 3180.8 [oz_av]

## 2016-10-02 MED ORDER — COLCHICINE 0.6 MG PO TABS: 0.6000 mg | ORAL_TABLET | Freq: Two times a day (BID) | ORAL | 5 refills | Status: DC

## 2016-10-02 MED ORDER — ATORVASTATIN CALCIUM 20 MG PO TABS: 20.0000 mg | ORAL_TABLET | Freq: Every day | ORAL | 3 refills | Status: DC

## 2016-10-02 NOTE — Progress Notes (Signed)
Follow-up Outpatient Visit Date: 10/02/2016  Chief Complaint: Hospital follow-up  HPI:  Nicholas Hale is a 70 y.o. year-old male with history of recurrent pulmonary emboli, hypertension, hyperlipidemia and recently diagnosed pericardial effusion with cardiac tamponade on requiring urgent pericardiocentesis. Fluid analysis was notable for adenocarcinoma, leading to diagnosis of primary lung malignancy. He presents for follow-up of his pericardial effusion. Since leaving the hospital, he has felt well without chest pain or shortness of breath. He notices that his heart seems to be more forcefully at times but does not have any rapid heartbeats or associated symptoms like lightheadedness or chest pain. He also denies lower extremity edema, orthopnea, and PND. He remains on rivaroxaban and aspirin without bleeding. He is scheduled to be seen in the oncology clinic next week to discuss further evaluation and management of his recently diagnosed lung cancer.  --------------------------------------------------------------------------------------------------  Cardiovascular History & Procedures: Cardiovascular Problems:  Malignant pericardial effusion  Recurrent pulmonary emboli  Risk Factors:  Hypertension, hyperlipidemia, male gender, and age greater than 45  Cath/PCI:  Pericardiocentesis (09/21/16): Successful pericardiocentesis with removal of 860 mL of serosanguineous fluid and resolution of pericardial effusion by echo.  CV Surgery:  None  EP Procedures and Devices:  None  Non-Invasive Evaluation(s):  TTE (09/20/16): Large pericardial effusion with signs of early And not. LVEF 60-65% with normal wall motion. No significant valvular abnormalities.  Limited TTE (08/31/16): No significant pericardial effusion. Normal LVEF (55-60%).  Recent CV Pertinent Labs: Lab Results  Component Value Date   CHOL 94 09/20/2016   HDL 23 (L) 09/20/2016   LDLCALC 50 09/20/2016   TRIG 106  09/20/2016   CHOLHDL 4.1 09/20/2016   INR 1.27 09/19/2016   K 3.8 09/23/2016   MG 2.0 09/19/2016   BUN 7 09/23/2016   CREATININE 0.92 09/23/2016     Past medical and surgical history were reviewed and updated in EPIC.   Outpatient Encounter Prescriptions as of 10/02/2016  Medication Sig  . aspirin 81 MG tablet Take 81 mg by mouth daily.  Marland Kitchen atorvastatin (LIPITOR) 20 MG tablet Take 1 tablet (20 mg total) by mouth daily at 6 PM.  . colchicine 0.6 MG tablet Take 1 tablet (0.6 mg total) by mouth 2 (two) times daily.  Marland Kitchen dextromethorphan-guaiFENesin (MUCINEX DM) 30-600 MG 12hr tablet Take 1 tablet by mouth 2 (two) times daily.  . rivaroxaban (XARELTO) 20 MG TABS tablet Take 1 tablet (20 mg total) by mouth daily with supper.  . Rivaroxaban 15 & 20 MG TBPK Take as directed on package: Start with one '15mg'$  tablet by mouth twice a day with food. On Day 22, switch to one '20mg'$  tablet once a day with food.   No facility-administered encounter medications on file as of 10/02/2016.     Allergies: Review of patient's allergies indicates no known allergies.  Social History   Social History  . Marital status: Married    Spouse name: N/A  . Number of children: N/A  . Years of education: N/A   Occupational History  . Not on file.   Social History Main Topics  . Smoking status: Former Smoker    Packs/day: 0.50    Years: 40.00    Types: Cigarettes    Quit date: 05/05/2009  . Smokeless tobacco: Never Used  . Alcohol use No  . Drug use: No  . Sexual activity: No   Other Topics Concern  . Not on file   Social History Narrative  . No narrative on file  Family History  Problem Relation Age of Onset  . Clotting disorder Mother   . Diabetes Mellitus I Mother   . Prostate cancer Father     Review of Systems: Patient reports his wife noted a small lump at the base of his neck on the left during his recent hospitalization. He has not had other lymphadenopathy. No recent infectious  symptoms. Otherwise, a 12-system review of systems was performed and was negative except as noted in the HPI.  --------------------------------------------------------------------------------------------------  Physical Exam: BP 118/76   Pulse 64   Ht '5\' 11"'$  (1.803 m)   Wt 198 lb 12.8 oz (90.2 kg)   SpO2 96%   BMI 27.73 kg/m   Pulses paradoxus 6 mmHg  General:  Well-developed, well-nourished man seated comfortably in the exam room. He is accompanied by his wife. HEENT: No conjunctival pallor or scleral icterus.  Moist mucous membranes.  OP clear. Neck: Supple with approximately 1.5 cm firm left supraclavicular lymph node. No other significant cervical lymphadenopathy. No axillary lymphadenopathy. No JVD or HJR. Lungs: Normal work of breathing.  Clear to auscultation bilaterally without wheezes or crackles. Heart: Regular rate and rhythm without murmurs, rubs, or gallops.  Non-displaced PMI. Abd: Bowel sounds present.  Soft, NT/ND without hepatosplenomegaly Ext: No lower extremity edema.  Radial, PT, and DP pulses are 2+ bilaterally. Skin: warm and dry without rash. Pericardial drain site with small scab. No erythema or tenderness.   Lab Results  Component Value Date   WBC 8.8 09/22/2016   HGB 15.0 09/22/2016   HCT 44.8 09/22/2016   MCV 94.7 09/22/2016   PLT 109 (L) 09/22/2016    Lab Results  Component Value Date   NA 140 09/23/2016   K 3.8 09/23/2016   CL 107 09/23/2016   CO2 25 09/23/2016   BUN 7 09/23/2016   CREATININE 0.92 09/23/2016   GLUCOSE 93 09/23/2016   ALT 69 (H) 09/21/2016    Lab Results  Component Value Date   CHOL 94 09/20/2016   HDL 23 (L) 09/20/2016   LDLCALC 50 09/20/2016   TRIG 106 09/20/2016   CHOLHDL 4.1 09/20/2016    --------------------------------------------------------------------------------------------------  ASSESSMENT AND PLAN: Malignant pericardial effusion Since drainage of the pericardial effusion, Nicholas Hale has felt  remarkably better. He is without symptoms today and demonstrates a normal blood pressure without significant pulses paradoxus. We will obtain a limited echocardiogram today to reevaluate for any fluid reaccumulation. If this is negative, we will plan to see him back in about 2 months. If fluid is present, depending on the size, we will need to consider close follow-up echocardiogram versus referral to cardiac surgery for a pericardial window. In the meantime, we will continue with colchicine. The patient should follow-up as previously planned with the oncology clinic for management of his lung cancer. Of note, concerning left supraclavicular lymph node was identified on exam today and should be reassessed by his oncologist.  Pulmonary emboli Patient is breathing comfortably and tolerating rivaroxaban well. He does not have a strong indication for ongoing aspirin use. We will therefore discontinue this to minimize bleeding while he is on rivaroxaban.  Follow-up: Return to clinic in 2 months.  Nelva Bush, MD 10/02/2016 7:50 AM

## 2016-10-02 NOTE — Patient Instructions (Addendum)
Medication Instructions:  Your physician recommends that you continue on your current medications as directed. Please refer to the Current Medication list given to you today.   Labwork: none  Testing/Procedures: Your physician has requested that you have an echocardiogram. Echocardiography is a painless test that uses sound waves to create images of your heart. It provides your doctor with information about the size and shape of your heart and how well your heart's chambers and valves are working. This procedure takes approximately one hour. There are no restrictions for this procedure. LIMITED    Follow-Up: Your physician recommends that you schedule a follow-up appointment in: 2 months with Dr End.         If you need a refill on your cardiac medications before your next appointment, please call your pharmacy.

## 2016-10-05 ENCOUNTER — Ambulatory Visit: Payer: Medicare Other

## 2016-10-05 ENCOUNTER — Encounter (HOSPITAL_COMMUNITY)
Admission: RE | Admit: 2016-10-05 | Discharge: 2016-10-05 | Disposition: A | Discharge status: Home / Self Care | Payer: Medicare Other | Source: Ambulatory Visit | Attending: Adult Health | Admitting: Adult Health

## 2016-10-05 ENCOUNTER — Ambulatory Visit (HOSPITAL_BASED_OUTPATIENT_CLINIC_OR_DEPARTMENT_OTHER): Payer: Medicare Other | Admitting: Internal Medicine

## 2016-10-05 ENCOUNTER — Other Ambulatory Visit (HOSPITAL_BASED_OUTPATIENT_CLINIC_OR_DEPARTMENT_OTHER): Payer: Medicare Other

## 2016-10-05 ENCOUNTER — Telehealth: Payer: Self-pay | Admitting: Internal Medicine

## 2016-10-05 ENCOUNTER — Encounter: Payer: Self-pay | Admitting: Internal Medicine

## 2016-10-05 DIAGNOSIS — J910 Malignant pleural effusion: Secondary | ICD-10-CM

## 2016-10-05 DIAGNOSIS — I313 Pericardial effusion (noninflammatory): Secondary | ICD-10-CM

## 2016-10-05 DIAGNOSIS — C3490 Malignant neoplasm of unspecified part of unspecified bronchus or lung: Secondary | ICD-10-CM | POA: Diagnosis not present

## 2016-10-05 DIAGNOSIS — Z5112 Encounter for antineoplastic immunotherapy: Secondary | ICD-10-CM | POA: Diagnosis not present

## 2016-10-05 DIAGNOSIS — C3432 Malignant neoplasm of lower lobe, left bronchus or lung: Secondary | ICD-10-CM | POA: Diagnosis not present

## 2016-10-05 DIAGNOSIS — C3492 Malignant neoplasm of unspecified part of left bronchus or lung: Secondary | ICD-10-CM | POA: Insufficient documentation

## 2016-10-05 DIAGNOSIS — I3139 Other pericardial effusion (noninflammatory): Secondary | ICD-10-CM

## 2016-10-05 DIAGNOSIS — Z7189 Other specified counseling: Secondary | ICD-10-CM | POA: Insufficient documentation

## 2016-10-05 DIAGNOSIS — R5382 Chronic fatigue, unspecified: Secondary | ICD-10-CM | POA: Insufficient documentation

## 2016-10-05 HISTORY — DX: Chronic fatigue, unspecified: R53.82

## 2016-10-05 LAB — COMPREHENSIVE METABOLIC PANEL
ALBUMIN: 3.4 g/dL — AB (ref 3.5–5.0)
ALK PHOS: 82 U/L (ref 40–150)
ALT: 61 U/L — ABNORMAL HIGH (ref 0–55)
ANION GAP: 7 meq/L (ref 3–11)
AST: 39 U/L — ABNORMAL HIGH (ref 5–34)
BUN: 13.1 mg/dL (ref 7.0–26.0)
CALCIUM: 8.8 mg/dL (ref 8.4–10.4)
CHLORIDE: 107 meq/L (ref 98–109)
CO2: 26 mEq/L (ref 22–29)
Creatinine: 1 mg/dL (ref 0.7–1.3)
EGFR: 76 mL/min/{1.73_m2} — ABNORMAL LOW (ref >90)
Glucose: 104 mg/dl (ref 70–140)
POTASSIUM: 4.6 meq/L (ref 3.5–5.1)
Sodium: 141 mEq/L (ref 136–145)
Total Bilirubin: 0.47 mg/dL (ref 0.20–1.20)
Total Protein: 7.2 g/dL (ref 6.4–8.3)

## 2016-10-05 LAB — CBC WITH DIFFERENTIAL/PLATELET
BASO%: 0.5 % (ref 0.0–2.0)
BASOS ABS: 0 10*3/uL (ref 0.0–0.1)
EOS ABS: 0.2 10*3/uL (ref 0.0–0.5)
EOS%: 2.7 % (ref 0.0–7.0)
HEMATOCRIT: 49 % (ref 38.4–49.9)
HEMOGLOBIN: 16.7 g/dL (ref 13.0–17.1)
LYMPH#: 2.3 10*3/uL (ref 0.9–3.3)
LYMPH%: 28.6 % (ref 14.0–49.0)
MCH: 32.1 pg (ref 27.2–33.4)
MCHC: 34.1 g/dL (ref 32.0–36.0)
MCV: 94 fL (ref 79.3–98.0)
MONO#: 0.7 10*3/uL (ref 0.1–0.9)
MONO%: 8.3 % (ref 0.0–14.0)
NEUT#: 4.9 10*3/uL (ref 1.5–6.5)
NEUT%: 59.9 % (ref 39.0–75.0)
PLATELETS: 216 10*3/uL (ref 140–400)
RBC: 5.21 10*6/uL (ref 4.20–5.82)
RDW: 13.2 % (ref 11.0–14.6)
WBC: 8.2 10*3/uL (ref 4.0–10.3)

## 2016-10-05 LAB — GLUCOSE, CAPILLARY: Glucose-Capillary: 104 mg/dL — ABNORMAL HIGH (ref 65–99)

## 2016-10-05 MED ORDER — FLUDEOXYGLUCOSE F - 18 (FDG) INJECTION
9.6200 | Freq: Once | INTRAVENOUS | Status: AC | PRN
  Administered 2016-10-05: 9.62 via INTRAVENOUS

## 2016-10-05 MED ORDER — PROCHLORPERAZINE MALEATE 10 MG PO TABS: 10.0000 mg | ORAL_TABLET | Freq: Four times a day (QID) | ORAL | 0 refills | Status: DC | PRN

## 2016-10-05 NOTE — Progress Notes (Signed)
[place] [phone]   [phone]  CONSULT NOTE  REFERRING PHYSICIAN: Dr. Baltazar Apo  REASON FOR CONSULTATION:  70 years old white male recently diagnosed with lung cancer.  HPI Nicholas Hale is a 70 y.o. male with past medical history significant for deep venous thrombosis and pulmonary embolism, hypertension, dyslipidemia, osteoarthritis as well as abdominal aortic aneurysm and long history of smoking but quit 10 years ago. The patient has a history of pulmonary embolism 2 years ago and he was treated with Eliquis for one year. Less than 2 weeks ago the patient started complaining of chest pain and shortness of breath. He presented to the emergency department and CT angiogram of the chest was performed and it showed several tiny bilateral pulmonary emboli. There was a small associated right pleural effusion and right basilar atelectasis. There was also moderate sized pericardial effusion. The scan also showed 1.2 cm hypodensity over the periphery of the left lobe of the liver. MRI of the abdomen was performed and the lesion of concern in the liver was compatible with a small simple cyst. On 09/21/2016 the patient was seen by cardiology and underwent pericardiocentesis under the care of Dr. Harrell Gave End. The final cytology ([id number]) as consistent with metastatic adenocarcinoma of lung primary. The cytology block was sent to [facility] one for molecular studies and PDL 1 expression. PDL 1  Expression was reported to be 80%. A PET scan was performed earlier today and it showed extensive thoracic and cervical nodal hypermetabolism consistent with metastatic disease. There was isolated left lower lobe hypermetabolic pulmonary nodule that may represent the primary lung cancer. There was also isolated abdominally retro-peritoneal low-level hypermetabolic node that could represent atypical distribution of lung cancer metastasis or be reactive. The patient  was referred to me today for further recommendation and treatment of his condition. When seen today the patient complains of fatigue but denied having any significant chest pain, shortness breath, cough or hemoptysis. He denied having any significant nausea or vomiting. He has mild weight loss with no headache or visual changes. Family history significant for mother with ovarian and breast cancer. Father died from prostate cancer. The patient is married and has 2 children. He was accompanied today by his wife Enid Derry. He is currently retired and used to work in a Writer. He has a history of smoking 1 pack per day for around 40 years and quit 10 years ago. He drinks alcohol occasionally and no history of drug abuse.  HPI  Past Medical History:  Diagnosis Date  . Abdominal aortic aneurysm ([place])   . Arthritis    "hips; lower spine" (05/24/2014)  . Chronic fatigue 10/05/2016  . Hyperlipidemia   . Hypertension   . Lung cancer ([place]) 09/25/2016  . PE (pulmonary embolism) 9/15  . Pericardial effusion 09/20/2016   Malignant effusion s/p pericardial drain  . Personal history of DVT (deep vein thrombosis) 9/15    Past Surgical History:  Procedure Laterality Date  . ANTERIOR FUSION CERVICAL SPINE  ~ 2005  . APPENDECTOMY  1978  . CARDIAC CATHETERIZATION N/A 09/21/2016   Procedure: Pericardiocentesis;  Surgeon: Jettie Booze, MD;  Location: [facility];  Service: Cardiovascular;  Laterality: N/A;  . COLONOSCOPY    . HEMIARTHROPLASTY SHOULDER FRACTURE Left 12/2008  . INGUINAL HERNIA REPAIR Bilateral 1992  . JOINT REPLACEMENT    . PERICARDIAL FLUID DRAINAGE  09/21/2016  . TOTAL HIP ARTHROPLASTY Left 05/23/2014   Procedure: TOTAL HIP ARTHROPLASTY;  Surgeon: Kerin Salen, MD;  Location: [facility];  Service: Orthopedics;  Laterality: Left;  . TOTAL HIP ARTHROPLASTY Right 05/15/2015   Procedure: TOTAL HIP ARTHROPLASTY;  Surgeon: Frederik Pear, MD;  Location: [facility];  Service: Orthopedics;   Laterality: Right;    Family History  Problem Relation Age of Onset  . Clotting disorder Mother   . Diabetes Mellitus I Mother   . Prostate cancer Father     Social History Social History  Substance Use Topics  . Smoking status: Former Smoker    Packs/day: 0.50    Years: 40.00    Types: Cigarettes    Quit date: 05/05/2009  . Smokeless tobacco: Never Used  . Alcohol use No    No Known Allergies  Current Outpatient Prescriptions  Medication Sig Dispense Refill  . atorvastatin (LIPITOR) 20 MG tablet Take 1 tablet (20 mg total) by mouth daily at 6 PM. 90 tablet 3  . colchicine 0.6 MG tablet Take 1 tablet (0.6 mg total) by mouth 2 (two) times daily. 60 tablet 5  . dextromethorphan-guaiFENesin (MUCINEX DM) 30-600 MG 12hr tablet Take 1 tablet by mouth 2 (two) times daily. 12 tablet 0  . rivaroxaban (XARELTO) 20 MG TABS tablet Take 1 tablet (20 mg total) by mouth daily with supper. 30 tablet 5  . Rivaroxaban 15 & 20 MG TBPK Take as directed on package: Start with one '15mg'$  tablet by mouth twice a day with food. On Day 22, switch to one '20mg'$  tablet once a day with food. 51 each 0   No current facility-administered medications for this visit.     Review of Systems  Constitutional: positive for fatigue and weight loss Eyes: negative Ears, nose, mouth, throat, and face: negative Respiratory: negative Cardiovascular: negative Gastrointestinal: negative Genitourinary:negative Integument/breast: negative Hematologic/lymphatic: negative Musculoskeletal:negative Neurological: negative Behavioral/Psych: negative Endocrine: negative Allergic/Immunologic: negative  Physical Exam  SEG:BTDVV, healthy, no distress, well nourished, well developed and anxious SKIN: skin color, texture, turgor are normal, no rashes or significant lesions HEAD: Normocephalic, No masses, lesions, tenderness or abnormalities EYES: normal, PERRLA, Conjunctiva are pink and non-injected EARS: External ears  normal, Canals clear OROPHARYNX:no exudate, no erythema and lips, buccal mucosa, and tongue normal  NECK: supple, no adenopathy, no JVD LYMPH:  no palpable lymphadenopathy, no hepatosplenomegaly LUNGS: clear to auscultation , and palpation HEART: regular rate & rhythm, no murmurs and no gallops ABDOMEN:abdomen soft, non-tender, normal bowel sounds and no masses or organomegaly BACK: Back symmetric, no curvature., No CVA tenderness EXTREMITIES:no joint deformities, effusion, or inflammation, no edema, no skin discoloration  NEURO: alert & oriented x 3 with fluent speech, no focal motor/sensory deficits  PERFORMANCE STATUS: ECOG 1  LABORATORY DATA: Lab Results  Component Value Date   WBC 8.2 10/05/2016   HGB 16.7 10/05/2016   HCT 49.0 10/05/2016   MCV 94.0 10/05/2016   PLT 216 10/05/2016      Chemistry      Component Value Date/Time   NA 141 10/05/2016 1333   K 4.6 10/05/2016 1333   CL 107 09/23/2016 0557   CO2 26 10/05/2016 1333   BUN 13.1 10/05/2016 1333   CREATININE 1.0 10/05/2016 1333      Component Value Date/Time   CALCIUM 8.8 10/05/2016 1333   ALKPHOS 82 10/05/2016 1333   AST 39 (H) 10/05/2016 1333   ALT 61 (H) 10/05/2016 1333   BILITOT 0.47 10/05/2016 1333       RADIOGRAPHIC STUDIES: Dg Chest 2 View  Result Date: 09/19/2016 CLINICAL DATA:  Shortness of breath with exertion EXAM: CHEST  2 VIEW COMPARISON:  05/06/2015 FINDINGS: Cardiac shadow is stable. The lungs are well aerated bilaterally. No focal infiltrate or sizable effusion is seen. Degenerative changes of the thoracic spine are noted. IMPRESSION: No active cardiopulmonary disease. Electronically Signed   By: Inez Catalina M.D.   On: 09/19/2016 16:18   Ct Angio Chest Pe W And/or Wo Contrast  Addendum Date: 09/19/2016   ADDENDUM REPORT: 09/19/2016 18:49 ADDENDUM: Recommend followup CT chest 6 months to document stability of the 8 mm pleural-based nodule left lower lobe. Electronically Signed   By: Marin Olp M.D.   On: 09/19/2016 18:49   Result Date: 09/19/2016 CLINICAL DATA:  Shortness of breath.  History of PD 2015. EXAM: CT ANGIOGRAPHY CHEST WITH CONTRAST TECHNIQUE: Multidetector CT imaging of the chest was performed using the standard protocol during bolus administration of intravenous contrast. Multiplanar CT image reconstructions and MIPs were obtained to evaluate the vascular anatomy. CONTRAST:  100 mL Isovue 370 IV. COMPARISON:  08/08/2014 and 08/05/2014 FINDINGS: Cardiovascular: Examination demonstrates the heart to be normal in size as there is a moderate size pericardial effusion. Thoracic aorta is otherwise within normal. Pulmonary arterial system demonstrates several tiny bilateral pulmonary emboli. RV/LV ratio is 0.99. Tiny focus of calcification along the left anterior descending coronary artery. Mediastinum/Nodes: There are a few shotty mediastinal lymph nodes. There is a 1 cm right hilar lymph node. Remaining mediastinal structures are unremarkable. Lungs/Pleura: Lungs are adequately inflated demonstrate mild paraseptal and centrilobular emphysematous change over the mid to upper lungs. 8 mm pleural based nodule opacity over the left lower lobe. Small right pleural effusion with associated atelectasis. Airways are within normal. Upper Abdomen: Partially visualized 3.4 cm cyst over the upper pole right kidney. 1.2 cm hypodensity along the periphery of the left lobe of the liver. Colonic diverticulosis. Musculoskeletal: Degenerative change of the spine. Review of the MIP images confirms the above findings. IMPRESSION: Several tiny bilateral pulmonary emboli. Small associated right pleural effusion and right basilar atelectasis. RV/LV ratio 0.99 compatible with intermediate risk pulmonary embolism. The presence of right heart strain has been associated with an increased risk of morbidity and mortality. Moderate size pericardial effusion. Mild emphysematous disease. 1.2 cm hypodensity over the  periphery of the left lobe of the liver not well seen previously. Consider MRI on elective basis for further evaluation. Right renal cyst. Critical Value/emergent results were called by telephone at the time of interpretation on 09/19/2016 at 5:42 pm to Dr. Davonna Belling , who verbally acknowledged these results. Electronically Signed: By: Marin Olp M.D. On: 09/19/2016 17:42   Mr Abdomen W Wo Contrast  Result Date: 09/20/2016 CLINICAL DATA:  70 year old male with history of right renal cyst and indeterminate lesion in the left lobe of the liver. Followup study. EXAM: MRI ABDOMEN WITHOUT AND WITH CONTRAST TECHNIQUE: Multiplanar multisequence MR imaging of the abdomen was performed both before and after the administration of intravenous contrast. CONTRAST:  30m MULTIHANCE GADOBENATE DIMEGLUMINE 529 MG/ML IV SOLN COMPARISON:  No prior abdominal MRI.  CT of the chest 09/19/2016. FINDINGS: Lower chest: Small right pleural effusion. Signal intensity in the dependent right lower lobe likely reflects areas of subsegmental atelectasis. Moderate pericardial effusion. Hepatobiliary: The lesion of concern in the periphery of segment 4A of the liver measures 13 mm in diameter and is T1 hypointense, T2 hyperintense, and does not enhance, compatible with a simple hepatic cyst. No suspicious hepatic lesions are noted. No intra or extrahepatic biliary  ductal dilatation. Gallbladder is unremarkable in appearance. Pancreas: No pancreatic mass. No pancreatic ductal dilatation. No pancreatic or peripancreatic fluid or inflammatory changes. Spleen:  Unremarkable. Adrenals/Urinary Tract: Within the kidneys bilaterally there are several T1 hypointense, T2 hyperintense, nonenhancing lesions, compatible with simple cysts. In addition, there is a very large exophytic lesion extending off the lower pole of the right kidney which measures up to 13.7 x 10.3 x 11.7 cm (axial image 36 of series 7 and coronal image 26 of series 4),  which has 3 thin internal septations which do not demonstrate significant thickening or enhancement, and demonstrates no mural nodularity or other internal enhancement, compatible with a mildly complex cyst (Bosniak class 2). No other suspicious renal lesions are noted. No hydroureteronephrosis. Bilateral adrenal glands are normal in appearance. Stomach/Bowel: Visualized portions are unremarkable. Vascular/Lymphatic: Aortic atherosclerosis, with aneurysmal dilatation of the infrarenal abdominal aorta which measures up to 3.6 x 3.0 cm (image 108 of series 1101) where there is some eccentric nonenhancing material, compatible with a combination of atheromatous plaque and/or mural thrombus. No lymphadenopathy noted in the abdomen. Other: Perinephric stranding bilaterally (nonspecific). No significant volume of ascites. Musculoskeletal: No aggressive osseous lesions are noted in the visualized portions of the skeleton. IMPRESSION: 1. The lesion of concern in the liver is compatible with a small simple cyst. This is a benign finding. 2. 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 3. Infrarenal abdominal aortic aneurysm measuring up to 3.0 x 3.6 cm (mean diameter of 3.3 cm). Recommend followup by ultrasound in 3 years. This recommendation follows ACR consensus guidelines: White Paper of the ACR Incidental Findings Committee II on Vascular Findings. J Am Coll Radiol 2013; 10:789-794. 4. Moderate pericardial effusion. 5. Small right pleural effusion with some passive atelectasis in the right lower lobe. Electronically Signed   By: Vinnie Langton M.D.   On: 09/20/2016 13:19   Nm Pet Image Initial (pi) Skull Base To Thigh  Result Date: 10/05/2016 CLINICAL DATA:  Initial treatment strategy for staging of lung cancer. Recent pericardial effusion  positive for adenocarcinoma by cytology. EXAM: NUCLEAR MEDICINE PET SKULL BASE TO THIGH TECHNIQUE: 9.6 mCi F-18 FDG was injected intravenously. Full-ring PET imaging was performed from the skull base to thigh after the radiotracer. CT data was obtained and used for attenuation correction and anatomic localization. FASTING BLOOD GLUCOSE:  Value: 104 mg/dl COMPARISON:  Chest CT of 09/19/2016.  Abdominal MRI of 09/20/2016. FINDINGS: NECK [place] tonsil hypermetabolism is likely physiologic. Left greater than right hypermetabolic cervical nodes. Index left-sided node in the level 2 station measures 9 mm and a S.U.V. max of 11.3 on image 32/series 4. CHEST Hypermetabolism corresponding to a pleural-based left lower lobe pulmonary nodule. This measures 11 mm and a S.U.V. max of 2.7 on image 33/series 8. Extensive mediastinal and hilar nodal hypermetabolism. Example right paratracheal node which measures 9 mm and a S.U.V. max of 16.8 on image 68/series 4. Prevascular node measures 10 mm and a S.U.V. max of 16.9 on image 68/ series 4. Right infrahilar node measures 9 mm and a S.U.V. max of 11.3 on image 82/series 4. ABDOMEN/PELVIS No adrenal hypermetabolism. An AP window node measures 8 mm and a S.U.V. max of 3.3 on image 132/series 4. SKELETON No abnormal marrow activity. CT IMAGES PERFORMED FOR ATTENUATION CORRECTION No further findings within the neck. Chest findings  deferred to recent diagnostic CT. Proximal left humerus fixation. Lad coronary artery atherosclerosis. Centrilobular emphysema. Resolved pericardial effusion. Right renal lesions which were evaluated on prior MRI. Left hepatic lobe subcapsular cyst. Abdominal aortic atherosclerosis with infrarenal ectasia at 3.2 cm. Probable gallstone. Degraded evaluation of the pelvis, secondary to beam hardening artifact from bilateral hip arthroplasty. Extensive colonic diverticulosis. Degenerative partial fusion of the bilateral sacroiliac joints. Cervical spine  fixation. IMPRESSION: 1. Extensive thoracic and cervical nodal hypermetabolism, consistent with metastatic disease. 2. Isolated left lower lobe hypermetabolic pulmonary nodule may represent the primary. Of note, the hypermetabolism is relatively low-level compared to the metastatic nodes. 3. Isolated abdominal retroperitoneal low-level hypermetabolic node could represent atypical distribution of lung cancer metastasis or be reactive. 4. Coronary artery atherosclerosis. Aortic atherosclerosis. Infrarenal aortic dilatation, as on prior MRI. Electronically Signed   By: Abigail Miyamoto M.D.   On: 10/05/2016 12:42   Dg Chest Port 1 View  Result Date: 09/21/2016 CLINICAL DATA:  Pericardiocentesis . EXAM: PORTABLE CHEST 1 VIEW COMPARISON:  CT 09/19/2016 FINDINGS: Drainage catheter noted over the lower chest, presumably of pericardial drainage catheter. Mediastinum and hilar structures are normal. Mild right lower lobe infiltrate noted. Small right pleural effusion. No pneumothorax. Heart size normal. Cervical spine fusion . IMPRESSION: 1. Drainage catheter noted over the lower mid chest, presumably a pericardial drainage catheter. No evidence of cardiomegaly. No pneumothorax. 2. Mild infiltrate right lung base.  Mild right pleural effusion . Electronically Signed   By: Marcello Moores  Register   On: 09/21/2016 11:57    ASSESSMENT: Severe very pleasant 70 years old white male recently diagnosed with a stage IV (T1a, N3, M1b) non-small cell lung cancer, adenocarcinoma (PDL! 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   PLAN: I had a lengthy discussion with the patient and his wife today about his current disease stage, prognosis and treatment options. I explained to the patient that he has incurable condition and also treatment  will be off palliative nature. I recommended for him to complete the staging workup by ordering a MRI of the brain to rule out brain metastasis. The tissue block was sent for molecular studies but the results are still pending. I also requested blood test for EGFR mutation. I gave the patient the option of palliative care and hospice referral versus consideration of treatment with either chemotherapy or single agent immunotherapy with [name] (pembrolizumab). He is interested in the treatment with the immunotherapy with [place] (pembrolizumab) unless he have an actionable mutation. We will delay the start of his treatment with immunotherapy until late next week to have more time for the molecular studies to become available as the patient is currently asymptomatic. I discussed with the patient adverse effect of the immunotherapy including but not limited to immune mediated skin rash, diarrhea, inflammation of the lung, kidney, liver, thyroid or other endocrine dysfunction. He is expected to start the first dose of his treatment on 10/14/2016. I will arrange for the patient to have a chemotherapy education class before starting the first dose of his treatment. He would come back for follow-up visit in 2 weeks for evaluation and management any adverse effect of his treatment. The patient was advised to call immediately if he has any concerning symptoms in the interval.  The patient voices understanding of current disease status and treatment options and is in agreement with the current care  plan.  All questions were answered. The patient knows to call the clinic with any problems, questions or concerns. We can certainly see the patient much sooner if necessary.  Thank you so much for allowing me to participate in the care of [name]. I will continue to follow up the patient with you and assist in his care.  I spent 55 minutes counseling the patient face to face. The total time spent in the  appointment was 80 minutes.  Disclaimer: This note was dictated with voice recognition software. Similar sounding words can inadvertently be transcribed and may not be corrected upon review.   Jai Steil K. October 05, 2016, 3:32 PM

## 2016-10-05 NOTE — Telephone Encounter (Signed)
GAVE PATIENT AVS REPORT AND APPOINTMENTS FOR November AND December

## 2016-10-05 NOTE — Progress Notes (Signed)
START ON PATHWAY REGIMEN - Non-Small Cell Lung  [id number]: Pembrolizumab 200 mg q21 Days Until Disease Progression, Unacceptable Toxicity, or up to 24 Months   A cycle is 21 days:     Pembrolizumab [facility])) 200 mg flat dose in 50 mL NS IV over 30 minutes every 21 days.  Inline filter required (low-protein binding) Dose Mod: None Additional Orders: Severe immune-mediated reactions can occur. See prescribing information for more details and required immediate management with steroids. Monitor thyroid, renal, liver function tests, glucose, and sodium at baseline and before each  dose of pembrolizumab. Ref: Keytruda(R) (pembrolizumab) prescribing information, 2016.  **Always confirm dose/schedule in your pharmacy ordering system**    Patient Characteristics: Stage IV Metastatic, Non Squamous, Initial Chemotherapy/Immunotherapy, PS = 0, 1, PD-L1 Expression Positive  >= 50% (TPS) AJCC M Stage: 1 AJCC N Stage: 3 AJCC T Stage: 1a Current Disease Status: Distant Metastases AJCC Stage Grouping: IV Histology: Non Squamous Cell ROS1 Rearrangement Status: Awaiting Test Results T790M Mutation Status: Not Applicable - EGFR Mutation Negative/Unknown Other Mutations/Biomarkers: No Other Actionable Mutations PD-L1 Expression Status: PD-L1 Positive >= 50% (TPS) Chemotherapy/Immunotherapy LOT: Initial Chemotherapy/Immunotherapy Molecular Targeted Therapy: Not Appropriate ALK Translocation Status: Awaiting Test Results Would you be surprised if this patient died  in the next year? I would NOT be surprised if this patient died in the next year EGFR Mutation Status: Awaiting Test Results BRAF V600E Mutation Status: Awaiting Test Results Performance Status: PS = 0, 1  Intent of Therapy: Non-Curative / Palliative Intent, Discussed with Patient

## 2016-10-06 ENCOUNTER — Telehealth: Payer: Self-pay | Admitting: *Deleted

## 2016-10-06 ENCOUNTER — Encounter: Payer: Self-pay | Admitting: *Deleted

## 2016-10-06 ENCOUNTER — Ambulatory Visit: Payer: Medicare Other | Admitting: Nurse Practitioner

## 2016-10-06 ENCOUNTER — Other Ambulatory Visit: Payer: Medicare Other

## 2016-10-06 NOTE — Telephone Encounter (Signed)
"  Nicholas Hale with [facility] in [place].  We have orders for EGFR test we cannot run without information from a patholopgy. diagnosis code or office note explaining why this test needs to be done."  Faxed 10-05-2016 office note to [facility] as requested to [phone].  ICD-10 code provided.

## 2016-10-09 ENCOUNTER — Ambulatory Visit (HOSPITAL_COMMUNITY)
Admission: RE | Admit: 2016-10-09 | Discharge: 2016-10-09 | Disposition: A | Discharge status: Home / Self Care | Payer: Medicare Other | Source: Ambulatory Visit | Attending: Internal Medicine | Admitting: Internal Medicine

## 2016-10-09 DIAGNOSIS — R5382 Chronic fatigue, unspecified: Secondary | ICD-10-CM | POA: Diagnosis not present

## 2016-10-09 DIAGNOSIS — C3490 Malignant neoplasm of unspecified part of unspecified bronchus or lung: Secondary | ICD-10-CM | POA: Diagnosis not present

## 2016-10-09 DIAGNOSIS — Z5112 Encounter for antineoplastic immunotherapy: Secondary | ICD-10-CM

## 2016-10-09 DIAGNOSIS — C3492 Malignant neoplasm of unspecified part of left bronchus or lung: Secondary | ICD-10-CM

## 2016-10-09 MED ORDER — GADOBENATE DIMEGLUMINE 529 MG/ML IV SOLN
20.0000 mL | Freq: Once | INTRAVENOUS | Status: AC | PRN
  Administered 2016-10-09: 18 mL via INTRAVENOUS

## 2016-10-13 ENCOUNTER — Encounter (HOSPITAL_COMMUNITY): Payer: Self-pay

## 2016-10-14 ENCOUNTER — Encounter: Payer: Self-pay | Admitting: *Deleted

## 2016-10-14 ENCOUNTER — Ambulatory Visit (HOSPITAL_BASED_OUTPATIENT_CLINIC_OR_DEPARTMENT_OTHER): Payer: Medicare Other

## 2016-10-14 ENCOUNTER — Other Ambulatory Visit (HOSPITAL_BASED_OUTPATIENT_CLINIC_OR_DEPARTMENT_OTHER): Payer: Medicare Other

## 2016-10-14 ENCOUNTER — Encounter: Payer: Self-pay | Admitting: Nurse Practitioner

## 2016-10-14 ENCOUNTER — Ambulatory Visit (HOSPITAL_BASED_OUTPATIENT_CLINIC_OR_DEPARTMENT_OTHER): Payer: Medicare Other | Admitting: Nurse Practitioner

## 2016-10-14 VITALS — BP 135/69 | HR 70 | Temp 98.4°F | Resp 18 | Ht 71.0 in | Wt 200.9 lb

## 2016-10-14 DIAGNOSIS — C3432 Malignant neoplasm of lower lobe, left bronchus or lung: Secondary | ICD-10-CM

## 2016-10-14 DIAGNOSIS — Z5112 Encounter for antineoplastic immunotherapy: Secondary | ICD-10-CM | POA: Diagnosis not present

## 2016-10-14 DIAGNOSIS — C3492 Malignant neoplasm of unspecified part of left bronchus or lung: Secondary | ICD-10-CM

## 2016-10-14 DIAGNOSIS — Z79899 Other long term (current) drug therapy: Secondary | ICD-10-CM | POA: Diagnosis not present

## 2016-10-14 DIAGNOSIS — R5382 Chronic fatigue, unspecified: Secondary | ICD-10-CM

## 2016-10-14 LAB — COMPREHENSIVE METABOLIC PANEL
ALBUMIN: 3.2 g/dL — AB (ref 3.5–5.0)
ALT: 50 U/L (ref 0–55)
ANION GAP: 8 meq/L (ref 3–11)
AST: 33 U/L (ref 5–34)
Alkaline Phosphatase: 83 U/L (ref 40–150)
BILIRUBIN TOTAL: 0.41 mg/dL (ref 0.20–1.20)
BUN: 13.1 mg/dL (ref 7.0–26.0)
CALCIUM: 8.9 mg/dL (ref 8.4–10.4)
CO2: 24 mEq/L (ref 22–29)
CREATININE: 1.1 mg/dL (ref 0.7–1.3)
Chloride: 107 mEq/L (ref 98–109)
EGFR: 71 mL/min/{1.73_m2} — ABNORMAL LOW (ref >90)
Glucose: 104 mg/dl (ref 70–140)
Potassium: 3.9 mEq/L (ref 3.5–5.1)
Sodium: 139 mEq/L (ref 136–145)
TOTAL PROTEIN: 7.1 g/dL (ref 6.4–8.3)

## 2016-10-14 LAB — CBC WITH DIFFERENTIAL/PLATELET
BASO%: 0.4 % (ref 0.0–2.0)
Basophils Absolute: 0 10*3/uL (ref 0.0–0.1)
EOS%: 4.2 % (ref 0.0–7.0)
Eosinophils Absolute: 0.3 10*3/uL (ref 0.0–0.5)
HEMATOCRIT: 48 % (ref 38.4–49.9)
HEMOGLOBIN: 16.1 g/dL (ref 13.0–17.1)
LYMPH#: 1.8 10*3/uL (ref 0.9–3.3)
LYMPH%: 25.7 % (ref 14.0–49.0)
MCH: 31.6 pg (ref 27.2–33.4)
MCHC: 33.5 g/dL (ref 32.0–36.0)
MCV: 94.1 fL (ref 79.3–98.0)
MONO#: 0.6 10*3/uL (ref 0.1–0.9)
MONO%: 8.1 % (ref 0.0–14.0)
NEUT%: 61.6 % (ref 39.0–75.0)
NEUTROS ABS: 4.2 10*3/uL (ref 1.5–6.5)
PLATELETS: 157 10*3/uL (ref 140–400)
RBC: 5.1 10*6/uL (ref 4.20–5.82)
RDW: 13.4 % (ref 11.0–14.6)
WBC: 6.9 10*3/uL (ref 4.0–10.3)

## 2016-10-14 LAB — TSH: TSH: 2.627 m[IU]/L (ref 0.320–4.118)

## 2016-10-14 MED ORDER — SODIUM CHLORIDE 0.9 % IV SOLN
Freq: Once | INTRAVENOUS | Status: AC
  Administered 2016-10-14: 15:00:00 via INTRAVENOUS

## 2016-10-14 MED ORDER — PEMBROLIZUMAB CHEMO INJECTION 100 MG/4ML
200.0000 mg | Freq: Once | INTRAVENOUS | Status: AC
  Administered 2016-10-14: 200 mg via INTRAVENOUS
  Filled 2016-10-14: qty 8

## 2016-10-14 NOTE — Patient Instructions (Signed)
[facility] Discharge Instructions for Patients Receiving Chemotherapy  Today you received the following chemotherapy agent: Keytruda.  To help prevent nausea and vomiting after your treatment, we encourage you to take your nausea medication as prescribed. If you develop nausea and vomiting that is not controlled by your nausea medication, call the clinic.   BELOW ARE SYMPTOMS THAT SHOULD BE REPORTED IMMEDIATELY:  *FEVER GREATER THAN 100.5 F  *CHILLS WITH OR WITHOUT FEVER  NAUSEA AND VOMITING THAT IS NOT CONTROLLED WITH YOUR NAUSEA MEDICATION  *UNUSUAL SHORTNESS OF BREATH  *UNUSUAL BRUISING OR BLEEDING  TENDERNESS IN MOUTH AND THROAT WITH OR WITHOUT PRESENCE OF ULCERS  *URINARY PROBLEMS  *BOWEL PROBLEMS  UNUSUAL RASH Items with * indicate a potential emergency and should be followed up as soon as possible.  Feel free to call the clinic you have any questions or concerns. The clinic phone number is [phone].  Please show the [place] at check-in to the Emergency Department and triage nurse.    Pembrolizumab injection What is this medicine? PEMBROLIZUMAB (pem broe liz ue mab) is a monoclonal antibody. It is used to treat melanoma, head and neck cancer, Hodgkin lymphoma, and non-small cell lung cancer. COMMON BRAND NAME(S): Keytruda What should I tell my health care provider before I take this medicine? They need to know if you have any of these conditions: -diabetes -immune system problems -inflammatory bowel disease -liver disease -lung or breathing disease -lupus -organ transplant -an unusual or allergic reaction to pembrolizumab, other medicines, foods, dyes, or preservatives -pregnant or trying to get pregnant -breast-feeding How should I use this medicine? This medicine is for infusion into a vein. It is given by a health care professional in a hospital or clinic setting. A special MedGuide will be given to you before each treatment.  Be sure to read this information carefully each time. Talk to your pediatrician regarding the use of this medicine in children. While this drug may be prescribed for selected conditions, precautions do apply. What if I miss a dose? It is important not to miss your dose. Call your doctor or health care professional if you are unable to keep an appointment. What may interact with this medicine? Interactions have not been studied. Give your health care provider a list of all the medicines, herbs, non-prescription drugs, or dietary supplements you use. Also tell them if you smoke, drink alcohol, or use illegal drugs. Some items may interact with your medicine. What should I watch for while using this medicine? Your condition will be monitored carefully while you are receiving this medicine. You may need blood work done while you are taking this medicine. Do not become pregnant while taking this medicine or for 4 months after stopping it. Women should inform their doctor if they wish to become pregnant or think they might be pregnant. There is a potential for serious side effects to an unborn child. Talk to your health care professional or pharmacist for more information. Do not breast-feed an infant while taking this medicine or for 4 months after the last dose. What side effects may I notice from receiving this medicine? Side effects that you should report to your doctor or health care professional as soon as possible: -allergic reactions like skin rash, itching or hives, swelling of the face, lips, or tongue -bloody or black, tarry -breathing problems -changes in vision -chest pain -chills -constipation -cough -dizziness or feeling faint or lightheaded -fast or irregular heartbeat -fever -flushing -hair loss -low  blood counts - this medicine may decrease the number of white blood cells, red blood cells and platelets. You may be at increased risk for infections and bleeding. -muscle  pain -muscle weakness -persistent headache -signs and symptoms of high blood sugar such as dizziness; dry mouth; dry skin; fruity breath; nausea; stomach pain; increased hunger or thirst; increased urination -signs and symptoms of kidney injury like trouble passing urine or change in the amount of urine -signs and symptoms of liver injury like dark urine, light-colored stools, loss of appetite, nausea, right upper belly pain, yellowing of the eyes or skin -stomach pain -sweating -weight loss Side effects that usually do not require medical attention (report to your doctor or health care professional if they continue or are bothersome): -decreased appetite -diarrhea -tiredness Where should I keep my medicine? This drug is given in a hospital or clinic and will not be stored at home.  2017 Elsevier/Gold Standard (2016-02-17 19:28:44)

## 2016-10-14 NOTE — Progress Notes (Signed)
Oncology Nurse Navigator Documentation  Oncology Nurse Navigator Flowsheets 10/14/2016  Navigator Location [facility]  Navigator Encounter Type Treatment/spoke with patient and updated him on Guardant 360 test.  He signed form. I completed paper work and gave to infusion nurse.   Treatment Phase Treatment  Barriers/Navigation Needs Coordination of Care  Interventions Coordination of Care  Coordination of Care Other  Acuity Level 2  Acuity Level 2 Other  Time Spent with Patient 30

## 2016-10-14 NOTE — Progress Notes (Addendum)
  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  INTERVAL HISTORY:   Nicholas Hale returns as scheduled. He feels well. He denies shortness of breath. He has a good appetite. No nausea or vomiting. No mouth sores. No diarrhea.  Objective:  Vital signs in last 24 hours:  Blood pressure 135/69, pulse 70, temperature 98.4 F (36.9 C), temperature source Oral, resp. rate 18, height '5\' 11"'$  (1.803 m), weight 200 lb 14.4 oz (91.1 kg), SpO2 100 %.    HEENT: No thrush or ulcers. Resp: Lungs clear bilaterally. Cardio: Regular rate and rhythm. GI: Abdomen soft and nontender. No hepatomegaly. Vascular: No leg edema.   Lab Results:  Lab Results  Component Value Date   WBC 6.9 10/14/2016   HGB 16.1 10/14/2016   HCT 48.0 10/14/2016   MCV 94.1 10/14/2016   PLT 157 10/14/2016   NEUTROABS 4.2 10/14/2016    Imaging:  No results found.  Medications: I have reviewed the patient's current medications.  Assessment/Plan: 1. 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   Disposition: Nicholas Hale appears stable. The plan is to proceed with cycle 1 Pembrolizumab today as scheduled. He has attended the chemotherapy education class. Potential toxicities associated with Pembrolizumab again reviewed. Questions were answered. He will return for a follow-up visit and cycle 2 Pembrolizumab in 3 weeks.  There was  insufficient tissue to complete the molecular studies. Guardant testing requested today.  Patient seen with Dr. Julien Hale.    Nicholas Hale ANP/GNP-BC   10/14/2016  12:24 PM  ADDENDUM: Hematology/Oncology Attending: I had a face to face encounter with the patient today. I recommended his care plan. This is a very pleasant 70 years old white male recently diagnosed with a stage IV non-small cell lung cancer, adenocarcinoma with PDL 1 expression of 80%. Unfortunately there was insufficient material for molecular studies. I recommended for the patient to send blood sample to [facility] [facility] for molecular studies. I also discussed the results of the MRI of the brain with the patient and it showed no evidence for metastatic disease to the brain.  I recommended for the patient to proceed with his scheduled treatment with [name] 200 mg IV every 3 weeks for now. We will remind the patient with the adverse effect of treatment with immunotherapy. He would come back for follow-up visit in 3 weeks for evaluation before starting cycle #2. The patient was advised to call immediately if he has any concerning symptoms in the interval.  Disclaimer: This note was dictated with voice recognition software. Similar sounding words can inadvertently be transcribed and may be missed upon review. Nicholas Hale., MD 10/14/16

## 2016-10-15 LAB — EPIDERMAL GROWTH FACTOR RECEPTOR (EGFR) MUTATION ANALYSIS

## 2016-10-28 ENCOUNTER — Ambulatory Visit (INDEPENDENT_AMBULATORY_CARE_PROVIDER_SITE_OTHER): Payer: Medicare Other | Admitting: Emergency Medicine

## 2016-10-28 ENCOUNTER — Encounter: Payer: Self-pay | Admitting: Emergency Medicine

## 2016-10-28 DIAGNOSIS — I2699 Other pulmonary embolism without acute cor pulmonale: Secondary | ICD-10-CM

## 2016-10-28 DIAGNOSIS — C3492 Malignant neoplasm of unspecified part of left bronchus or lung: Secondary | ICD-10-CM | POA: Diagnosis not present

## 2016-10-28 NOTE — Assessment & Plan Note (Signed)
Following with Dr [name]. Alk-1 rearrangement pending, EGFR mutation was negative. PDL-1 was 80 % expression. He is on [place].

## 2016-10-28 NOTE — Progress Notes (Signed)
Subjective:    Patient ID: Nicholas Hale, male    DOB: [birth year], 70 y.o.   MRN: [id number]  HPI 70 year old male former smoker with previous pulmonary emboli and DVT in 2015, status post EKOS (completed 1 yr of Eliquis ) with recurrent PE and moderate pericardial effusion on 09/19/16 .  New dx lung cancer 08/2016 w/ pleural fluid cytology positive for adenocarcinoma.   TEST  10/23 - Pericardiocentesis:  Removal 860cc of serosanguineous fluid w/ drain left in place  STUDIES:  CTA chest 10/21: Several tiny bilateral pulmonary emboli. Small associated right pleural effusion and right basilar atelectasis. RV/LV ratio 0.99 compatible with intermediate risk pulmonary embolism.  Moderate size pericardial effusion. Mild emphysematous disease. 8 mm pleural based nodule opacity over the left lower lobe. Small right pleural effusion with associated atelectasis. BLE venous dopplers 10/22: There is acute, mobile thrombus noted in the right soleal vein. Acute DVT noted in the right gastrocnemius and peroneal veins and acute DVT noted in the left peroneal vein.   [name] follow up Extended OV  Patient returns for a post hospital follow-up. Patient was recently admitted October 21 of October 25 for recurrent PE/DVT. Patient had previous PE and DVT in 2015 8 weeks post op felt to be a provoked clot and was treated with 1 year of Eliquis. Patient presented to the emergency room with 3 days of exertional shortness of breath. He was found to have several bilateral pulmonary emboli with a right pleural effusion and a moderate pericardial effusion. Venous Doppler showed a an acute mobile thrombus noted in the right soleal vein. An acute DVT in the right gastrocnemius and peroneal veins. An acute DVT in the left peroneal vein. Initial 2-D echo showed a large pericardial effusion with signs of early tamponade.  Underwent a pericardiocentesis with 860 cc of serosanguineous fluid removed. Follow-up 2-D echo on  09/22/2016 showed an EF of 55-60% no remaining pericardial effusion. Patient was started on heparin and transition to Xarelto prior to discharge. Unfortunately, patient cytology came back positive for adenocarcinoma. Patient has been set up with oncology with Nicholas. Earlie Hale on 10/05/2016. He has a plans for a follow-up 2-D echo next week. We went over his test results and answered multiple questions. He and his wife have. He says since discharge he is starting to feel better. He has less dyspnea. He denies any cough, hemoptysis, chest pain, orthopnea, PND, or increased leg swelling.  He discussed with his new diagnosis of cancer that Lovenox may be superior anticoagulant in the setting of underlying active cancer . We discussed benefit /risk. He would like to stay with Xarelto for now.   ROV 10/28/16 -- 70 yo man w hx DVT / PE, new dx of Stage IV adenocarcinoma diagnosed from pericardial fluid, small PE's. He is on [place] and is tolerating well. Being maintained on xarelto as well. Denies CP. He does have some L thigh pain that is more longstanding, related to his hip sgy. He would like to stop mucinex. Denies any dyspnea or cough.     Past Medical History:  Diagnosis Date  . Abdominal aortic aneurysm ([place])   . Arthritis    "hips; lower spine" (05/24/2014)  . Chronic fatigue 10/05/2016  . Hyperlipidemia   . Hypertension   . Lung cancer ([place]) 09/25/2016  . PE (pulmonary embolism) 9/15  . Pericardial effusion 09/20/2016   Malignant effusion s/p pericardial drain  . Personal history of DVT (deep vein thrombosis) 9/15   Current Outpatient  Prescriptions on File Prior to Visit  Medication Sig Dispense Refill  . atorvastatin (LIPITOR) 20 MG tablet Take 1 tablet (20 mg total) by mouth daily at 6 PM. 90 tablet 3  . colchicine 0.6 MG tablet Take 1 tablet (0.6 mg total) by mouth 2 (two) times daily. 60 tablet 5  . dextromethorphan-guaiFENesin (MUCINEX DM) 30-600 MG 12hr tablet Take 1 tablet by mouth 2  (two) times daily. 12 tablet 0  . prochlorperazine (COMPAZINE) 10 MG tablet Take 1 tablet (10 mg total) by mouth every 6 (six) hours as needed for nausea or vomiting. 30 tablet 0  . rivaroxaban (XARELTO) 20 MG TABS tablet Take 1 tablet (20 mg total) by mouth daily with supper. 30 tablet 5  . Rivaroxaban 15 & 20 MG TBPK Take as directed on package: Start with one 3m tablet by mouth twice a day with food. On Day 22, switch to one 247mtablet once a day with food. (Patient not taking: Reported on 10/28/2016) 51 each 0   No current facility-administered medications on file prior to visit.      Review of Systems     Objective:   Physical Exam  . Vitals:   10/28/16 1640 10/28/16 1641  BP:  116/72  Pulse:  74  SpO2:  96%  Weight: 200 lb 9.6 oz (91 kg)   Height: _0  (1.803 m)    Gen: Pleasant, well-nourished, in no distress,  normal affect  ENT: No lesions,  mouth clear,  oropharynx clear, no postnasal drip  Neck: No JVD, no TMG, no carotid bruits  Lungs: No use of accessory muscles, decreased B basilar breath sounds  Cardiovascular: RRR, heart sounds normal, no murmur or gallops, no peripheral edema  Musculoskeletal: No deformities, no cyanosis or clubbing  Neuro: alert, non focal  Skin: Warm, no lesions or rashes     Assessment & Plan:  Recurrent pulmonary embolism (HCC) Continue xarelto 2061md. He will likely be on this lifelong, as long as well tolerated.   [facility] rearrangement pending, EGFR mutation was negative. PDL-1 was 80 % expression. He is on [place] RobBaltazar ApoD, Nicholas Hale 10/28/2016, 5:32 PM  Pulmonary and Critical Care [phone] if no answer [phone]

## 2016-10-28 NOTE — Patient Instructions (Signed)
Please continue your medications as you have been taking them.  We will continue your xarelto as you are talking it Stop mucinex Follow with Dr [name] as planned You may use the tramadol for your leg pain.  Follow with Dr Lamonte Sakai in 6 months or sooner if you have any problems

## 2016-10-28 NOTE — Assessment & Plan Note (Signed)
Continue xarelto '20mg'$  qd. He will likely be on this lifelong, as long as well tolerated.

## 2016-10-30 LAB — GUARDANT 360

## 2016-11-04 ENCOUNTER — Ambulatory Visit (HOSPITAL_BASED_OUTPATIENT_CLINIC_OR_DEPARTMENT_OTHER): Payer: Medicare Other

## 2016-11-04 ENCOUNTER — Other Ambulatory Visit (HOSPITAL_BASED_OUTPATIENT_CLINIC_OR_DEPARTMENT_OTHER): Payer: Medicare Other

## 2016-11-04 ENCOUNTER — Ambulatory Visit (HOSPITAL_BASED_OUTPATIENT_CLINIC_OR_DEPARTMENT_OTHER): Payer: Medicare Other | Admitting: Internal Medicine

## 2016-11-04 ENCOUNTER — Encounter: Payer: Self-pay | Admitting: Internal Medicine

## 2016-11-04 ENCOUNTER — Telehealth: Payer: Self-pay | Admitting: Internal Medicine

## 2016-11-04 VITALS — BP 121/69 | HR 61 | Temp 97.7°F | Resp 98 | Ht 71.0 in | Wt 203.0 lb

## 2016-11-04 DIAGNOSIS — Z79899 Other long term (current) drug therapy: Secondary | ICD-10-CM | POA: Diagnosis not present

## 2016-11-04 DIAGNOSIS — C3432 Malignant neoplasm of lower lobe, left bronchus or lung: Secondary | ICD-10-CM | POA: Diagnosis not present

## 2016-11-04 DIAGNOSIS — C3492 Malignant neoplasm of unspecified part of left bronchus or lung: Secondary | ICD-10-CM

## 2016-11-04 DIAGNOSIS — Z5112 Encounter for antineoplastic immunotherapy: Secondary | ICD-10-CM | POA: Diagnosis not present

## 2016-11-04 DIAGNOSIS — R5382 Chronic fatigue, unspecified: Secondary | ICD-10-CM

## 2016-11-04 LAB — CBC WITH DIFFERENTIAL/PLATELET
BASO%: 0.2 % (ref 0.0–2.0)
BASOS ABS: 0 10*3/uL (ref 0.0–0.1)
EOS%: 4.9 % (ref 0.0–7.0)
Eosinophils Absolute: 0.3 10*3/uL (ref 0.0–0.5)
HCT: 48.5 % (ref 38.4–49.9)
HGB: 16.1 g/dL (ref 13.0–17.1)
LYMPH#: 1.7 10*3/uL (ref 0.9–3.3)
LYMPH%: 24.6 % (ref 14.0–49.0)
MCH: 31.2 pg (ref 27.2–33.4)
MCHC: 33.2 g/dL (ref 32.0–36.0)
MCV: 93.9 fL (ref 79.3–98.0)
MONO#: 0.7 10*3/uL (ref 0.1–0.9)
MONO%: 10.8 % (ref 0.0–14.0)
NEUT#: 4 10*3/uL (ref 1.5–6.5)
NEUT%: 59.5 % (ref 39.0–75.0)
Platelets: 182 10*3/uL (ref 140–400)
RBC: 5.17 10*6/uL (ref 4.20–5.82)
RDW: 14.2 % (ref 11.0–14.6)
WBC: 6.7 10*3/uL (ref 4.0–10.3)

## 2016-11-04 LAB — COMPREHENSIVE METABOLIC PANEL
ALT: 29 U/L (ref 0–55)
AST: 26 U/L (ref 5–34)
Albumin: 3.3 g/dL — ABNORMAL LOW (ref 3.5–5.0)
Alkaline Phosphatase: 81 U/L (ref 40–150)
Anion Gap: 9 mEq/L (ref 3–11)
BUN: 11.2 mg/dL (ref 7.0–26.0)
CHLORIDE: 109 meq/L (ref 98–109)
CO2: 23 meq/L (ref 22–29)
CREATININE: 0.9 mg/dL (ref 0.7–1.3)
Calcium: 8.9 mg/dL (ref 8.4–10.4)
EGFR: 82 mL/min/{1.73_m2} — ABNORMAL LOW (ref >90)
GLUCOSE: 88 mg/dL (ref 70–140)
POTASSIUM: 3.8 meq/L (ref 3.5–5.1)
SODIUM: 141 meq/L (ref 136–145)
Total Bilirubin: 0.36 mg/dL (ref 0.20–1.20)
Total Protein: 7.1 g/dL (ref 6.4–8.3)

## 2016-11-04 LAB — TSH: TSH: 2.59 m[IU]/L (ref 0.320–4.118)

## 2016-11-04 MED ORDER — SODIUM CHLORIDE 0.9 % IV SOLN
Freq: Once | INTRAVENOUS | Status: AC
  Administered 2016-11-04: 15:00:00 via INTRAVENOUS

## 2016-11-04 MED ORDER — SODIUM CHLORIDE 0.9 % IV SOLN
200.0000 mg | Freq: Once | INTRAVENOUS | Status: AC
  Administered 2016-11-04: 200 mg via INTRAVENOUS
  Filled 2016-11-04: qty 8

## 2016-11-04 NOTE — Progress Notes (Signed)
[place] Telephone:([phone]   Fax:([phone]  OFFICE PROGRESS NOTE  [name], MD [place] [address]  DIAGNOSIS: Stage IV (T1a, N3, M1b) non-small cell lung cancer, adenocarcinoma with PDL 1 expression of 80%. 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  PRIOR THERAPY: None  CURRENT THERAPY: [name] (pembrolizumab) 200 mg IV every 3 weeks. Status post one cycle.  INTERVAL HISTORY: Nicholas Hale 70 y.o. male returns to the clinic today for follow-up visit accompanied by his wife. The patient tolerated the first cycle of his treatment with immunotherapy fairly well with no significant adverse effects. He denied having any significant chest pain, shortness of breath, cough or hemoptysis. He has no nausea or vomiting. He has no fever or chills. He denied having any skin rash or diarrhea. Has mild itching. She is here today for evaluation before starting cycle #2 of his treatment.  MEDICAL HISTORY: Past Medical History:  Diagnosis Date  . Abdominal aortic aneurysm ([place])   . Arthritis    "hips; lower spine" (05/24/2014)  . Chronic fatigue 10/05/2016  . Hyperlipidemia   . Hypertension   . Lung cancer ([place]) 09/25/2016  . PE (pulmonary embolism) 9/15  . Pericardial effusion 09/20/2016   Malignant effusion s/p pericardial drain  . Personal history of DVT (deep vein thrombosis) 9/15    ALLERGIES:  has No Known Allergies.  MEDICATIONS:  Current Outpatient Prescriptions  Medication Sig Dispense Refill  . atorvastatin (LIPITOR) 20 MG tablet Take 1 tablet (20 mg total) by mouth daily at 6 PM. 90 tablet 3  . colchicine 0.6 MG tablet Take 1 tablet (0.6 mg total) by mouth 2 (two) times daily. 60 tablet 5  . prochlorperazine (COMPAZINE) 10 MG tablet  Take 1 tablet (10 mg total) by mouth every 6 (six) hours as needed for nausea or vomiting. 30 tablet 0  . rivaroxaban (XARELTO) 20 MG TABS tablet Take 1 tablet (20 mg total) by mouth daily with supper. 30 tablet 5  . Rivaroxaban 15 & 20 MG TBPK Take as directed on package: Start with one 82m tablet by mouth twice a day with food. On Day 22, switch to one 227mtablet once a day with food. 51 each 0   No current facility-administered medications for this visit.     SURGICAL HISTORY:  Past Surgical History:  Procedure Laterality Date  . ANTERIOR FUSION CERVICAL SPINE  ~ 2005  . APPENDECTOMY  1978  . CARDIAC CATHETERIZATION N/A 09/21/2016   Procedure: Pericardiocentesis;  Surgeon: JaJettie BoozeMD;  [facility];  Service: Cardiovascular;  Laterality: N/A;  . COLONOSCOPY    . HEMIARTHROPLASTY SHOULDER FRACTURE Left 12/2008  . INGUINAL HERNIA REPAIR Bilateral 1992  . JOINT REPLACEMENT    . PERICARDIAL FLUID DRAINAGE  09/21/2016  . TOTAL HIP ARTHROPLASTY Left 05/23/2014   Procedure: TOTAL HIP ARTHROPLASTY;  Surgeon: FrKerin SalenMD;  [facility]: [facility]ics;  Laterality: Left;  . TOTAL HIP ARTHROPLASTY Right 05/15/2015   Procedure: TOTAL HIP ARTHROPLASTY;  Surgeon: FrFrederik PearMD;  [facility];  Laterality: Right;    REVIEW OF SYSTEMS:  A comprehensive review of systems was negative except for: Integument/breast: positive for pruritus   PHYSICAL EXAMINATION: General appearance: alert, cooperative and no distress Head: Normocephalic, without  obvious abnormality, atraumatic Neck: no adenopathy, no JVD, supple, symmetrical, trachea midline and thyroid not enlarged, symmetric, no tenderness/mass/nodules Lymph nodes: Cervical, supraclavicular, and axillary nodes normal. Resp: clear to auscultation bilaterally Back: symmetric, no curvature. ROM normal. No CVA tenderness. Cardio: regular rate and rhythm, S1, S2 normal, no murmur,  click, rub or gallop GI: soft, non-tender; bowel sounds normal; no masses,  no organomegaly Extremities: extremities normal, atraumatic, no cyanosis or edema  ECOG PERFORMANCE STATUS: 0 - Asymptomatic  Blood pressure 121/69, pulse 61, temperature 97.7 F (36.5 C), temperature source Oral, resp. rate (!) 98, height _0  (1.803 m), weight 203 lb (92.1 kg), SpO2 100 %.  LABORATORY DATA: Lab Results  Component Value Date   WBC 6.7 11/04/2016   HGB 16.1 11/04/2016   HCT 48.5 11/04/2016   MCV 93.9 11/04/2016   PLT 182 11/04/2016      Chemistry      Component Value Date/Time   NA 141 11/04/2016 1345   K 3.8 11/04/2016 1345   CL 107 09/23/2016 0557   CO2 23 11/04/2016 1345   BUN 11.2 11/04/2016 1345   CREATININE 0.9 11/04/2016 1345      Component Value Date/Time   CALCIUM 8.9 11/04/2016 1345   ALKPHOS 81 11/04/2016 1345   AST 26 11/04/2016 1345   ALT 29 11/04/2016 1345   BILITOT 0.36 11/04/2016 1345       RADIOGRAPHIC STUDIES: Mr Jeri Cos WN Contrast  Result Date: 10/09/2016 CLINICAL DATA:  Lung cancer, staging. EXAM: MRI HEAD WITHOUT AND WITH CONTRAST TECHNIQUE: Multiplanar, multiecho pulse sequences of the brain and surrounding structures were obtained without and with intravenous contrast. CONTRAST:  80m MULTIHANCE GADOBENATE DIMEGLUMINE 529 MG/ML IV SOLN COMPARISON:  None. FINDINGS: Brain: No edema or enhancement to suggest metastasis. Mild for age chronic microvascular ischemic change in the cerebral white matter and pons. 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 No acute infarct, hemorrhage, hydrocephalus, or collection. Vascular: Normal flow voids. Skull and upper cervical spine: Normal marrow signal. Sinuses/Orbits: Negative. IMPRESSION: Negative for metastatic disease. Electronically Signed   By: JMonte FantasiaM.D.   On: 10/09/2016 13:59    ASSESSMENT AND PLAN: This is a very pleasant 70years old white male recently diagnosed with stage IV non-small cell  lung cancer, adenocarcinoma with PDL1 expression of 80%. He is currently on treatment with [name] (pembrolizumab) status post 1 cycle. The patient tolerated the first cycle of his treatment well with no significant adverse effects. I recommended for him to proceed with cycle #2 today as scheduled. He would come back for follow-up visit in 3 weeks for evaluation before starting cycle #3. He was advised to call immediately if he has any concerning symptoms in the interval. The patient voices understanding of current disease status and treatment options and is in agreement with the current care plan.  All questions were answered. The patient knows to call the clinic with any problems, questions or concerns. We can certainly see the patient much sooner if necessary.  I spent 10 minutes counseling the patient face to face. The total time spent in the appointment was 15 minutes.  Disclaimer: This note was dictated with voice recognition software. Similar sounding words can inadvertently be transcribed and may not be corrected upon review.

## 2016-11-04 NOTE — Patient Instructions (Signed)
[facility] Discharge Instructions for Patients Receiving Chemotherapy  Today you received the following chemotherapy agent:  Keytruda To help prevent nausea and vomiting after your treatment, we encourage you to take your nausea medication as prescribed.   If you develop nausea and vomiting that is not controlled by your nausea medication, call the clinic.   BELOW ARE SYMPTOMS THAT SHOULD BE REPORTED IMMEDIATELY:  *FEVER GREATER THAN 100.5 F  *CHILLS WITH OR WITHOUT FEVER  NAUSEA AND VOMITING THAT IS NOT CONTROLLED WITH YOUR NAUSEA MEDICATION  *UNUSUAL SHORTNESS OF BREATH  *UNUSUAL BRUISING OR BLEEDING  TENDERNESS IN MOUTH AND THROAT WITH OR WITHOUT PRESENCE OF ULCERS  *URINARY PROBLEMS  *BOWEL PROBLEMS  UNUSUAL RASH Items with * indicate a potential emergency and should be followed up as soon as possible.  Feel free to call the clinic you have any questions or concerns. The clinic phone number is [phone].  Please show the CHEMO ALERT CARD at check-in to the Emergency Department and triage nurse.   

## 2016-11-04 NOTE — Telephone Encounter (Signed)
Message sent to chemo scheduler to be added. Appointments scheduled per 11/04/16 los. A copy of the AVS report and appointment schedule was given to the patient, per 11/04/16 los.

## 2016-11-05 ENCOUNTER — Telehealth: Payer: Self-pay | Admitting: *Deleted

## 2016-11-05 NOTE — Telephone Encounter (Signed)
Per LOS I have scheduled appts and notified the scheduler 

## 2016-11-16 ENCOUNTER — Encounter: Payer: Self-pay | Admitting: *Deleted

## 2016-11-16 NOTE — Progress Notes (Signed)
Oncology Nurse Navigator Documentation  Oncology Nurse Navigator Flowsheets 11/16/2016  Navigator Location [facility]  Navigator Encounter Type Other/Guardant 360 called to ask for more information for ICD 10 codes.  I updated on what is documented in chart.    Treatment Phase Treatment  Barriers/Navigation Needs Coordination of Care  Interventions Coordination of Care  Coordination of Care Other  Acuity Level 2  Acuity Level 2 Other  Time Spent with Patient 30

## 2016-11-19 ENCOUNTER — Encounter: Payer: Self-pay | Admitting: *Deleted

## 2016-11-25 ENCOUNTER — Encounter: Payer: Self-pay | Admitting: Internal Medicine

## 2016-11-25 ENCOUNTER — Ambulatory Visit (HOSPITAL_BASED_OUTPATIENT_CLINIC_OR_DEPARTMENT_OTHER): Payer: Medicare Other | Admitting: Internal Medicine

## 2016-11-25 ENCOUNTER — Other Ambulatory Visit (HOSPITAL_BASED_OUTPATIENT_CLINIC_OR_DEPARTMENT_OTHER): Payer: Medicare Other

## 2016-11-25 ENCOUNTER — Ambulatory Visit (HOSPITAL_BASED_OUTPATIENT_CLINIC_OR_DEPARTMENT_OTHER): Payer: Medicare Other

## 2016-11-25 ENCOUNTER — Telehealth: Payer: Self-pay | Admitting: Internal Medicine

## 2016-11-25 VITALS — BP 132/62 | HR 65 | Temp 97.9°F | Resp 18 | Ht 71.0 in | Wt 204.0 lb

## 2016-11-25 DIAGNOSIS — Z5112 Encounter for antineoplastic immunotherapy: Secondary | ICD-10-CM

## 2016-11-25 DIAGNOSIS — C3432 Malignant neoplasm of lower lobe, left bronchus or lung: Secondary | ICD-10-CM

## 2016-11-25 DIAGNOSIS — Z79899 Other long term (current) drug therapy: Secondary | ICD-10-CM | POA: Diagnosis not present

## 2016-11-25 DIAGNOSIS — C3492 Malignant neoplasm of unspecified part of left bronchus or lung: Secondary | ICD-10-CM

## 2016-11-25 DIAGNOSIS — R5382 Chronic fatigue, unspecified: Secondary | ICD-10-CM

## 2016-11-25 LAB — CBC WITH DIFFERENTIAL/PLATELET
BASO%: 1.3 % (ref 0.0–2.0)
BASOS ABS: 0.1 10*3/uL (ref 0.0–0.1)
EOS ABS: 0.4 10*3/uL (ref 0.0–0.5)
EOS%: 4.6 % (ref 0.0–7.0)
HEMATOCRIT: 49 % (ref 38.4–49.9)
HGB: 16.3 g/dL (ref 13.0–17.1)
LYMPH#: 1.9 10*3/uL (ref 0.9–3.3)
LYMPH%: 21.8 % (ref 14.0–49.0)
MCH: 31.4 pg (ref 27.2–33.4)
MCHC: 33.3 g/dL (ref 32.0–36.0)
MCV: 94.2 fL (ref 79.3–98.0)
MONO#: 0.9 10*3/uL (ref 0.1–0.9)
MONO%: 10.3 % (ref 0.0–14.0)
NEUT#: 5.4 10*3/uL (ref 1.5–6.5)
NEUT%: 62 % (ref 39.0–75.0)
Platelets: 185 10*3/uL (ref 140–400)
RBC: 5.2 10*6/uL (ref 4.20–5.82)
RDW: 14.2 % (ref 11.0–14.6)
WBC: 8.8 10*3/uL (ref 4.0–10.3)

## 2016-11-25 LAB — TSH: TSH: 2.777 m(IU)/L (ref 0.320–4.118)

## 2016-11-25 LAB — COMPREHENSIVE METABOLIC PANEL
ALT: 36 U/L (ref 0–55)
AST: 26 U/L (ref 5–34)
Albumin: 3.4 g/dL — ABNORMAL LOW (ref 3.5–5.0)
Alkaline Phosphatase: 89 U/L (ref 40–150)
Anion Gap: 8 mEq/L (ref 3–11)
BUN: 17.9 mg/dL (ref 7.0–26.0)
CO2: 24 mEq/L (ref 22–29)
Calcium: 9 mg/dL (ref 8.4–10.4)
Chloride: 108 mEq/L (ref 98–109)
Creatinine: 1.2 mg/dL (ref 0.7–1.3)
EGFR: 60 mL/min/{1.73_m2} — ABNORMAL LOW (ref >90)
Glucose: 88 mg/dl (ref 70–140)
Potassium: 4.1 mEq/L (ref 3.5–5.1)
Sodium: 140 mEq/L (ref 136–145)
Total Bilirubin: 0.28 mg/dL (ref 0.20–1.20)
Total Protein: 7.1 g/dL (ref 6.4–8.3)

## 2016-11-25 MED ORDER — SODIUM CHLORIDE 0.9 % IV SOLN
200.0000 mg | Freq: Once | INTRAVENOUS | Status: AC
  Administered 2016-11-25: 200 mg via INTRAVENOUS
  Filled 2016-11-25: qty 8

## 2016-11-25 MED ORDER — SODIUM CHLORIDE 0.9 % IV SOLN
Freq: Once | INTRAVENOUS | Status: AC
  Administered 2016-11-25: 15:00:00 via INTRAVENOUS

## 2016-11-25 NOTE — Progress Notes (Signed)
[place] [phone]   [phone]  OFFICE PROGRESS NOTE  [name], MD [place] [address]  DIAGNOSIS: Stage IV (T1a, N3, M1b) non-small cell lung cancer, adenocarcinoma with PDL 1 expression of 80%. 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  PRIOR THERAPY: None  CURRENT THERAPY: [name] (pembrolizumab) 200 mg IV every 3 weeks. Status post 2 cycles.  INTERVAL HISTORY: Nicholas Hale 70 y.o. male came to the clinic today accompanied by his wife for follow-up visit. The patient is feeling fine with no specific complaints. He is tolerating his current treatment with immunotherapy with [name] (pembrolizumab) fairly well except for fatigue a week after the treatment. He denied having any significant weight loss or night sweats. He has no fever or chills. He has no nausea, vomiting, diarrhea or constipation. He denied having any significant chest pain, shortness of breath, cough or hemoptysis. He is here today for evaluation before starting cycle #3.   MEDICAL HISTORY: Past Medical History:  Diagnosis Date  . Abdominal aortic aneurysm ([facility])   . Arthritis    "hips; lower spine" (05/24/2014)  . Chronic fatigue 10/05/2016  . Hyperlipidemia   . Hypertension   . Lung cancer ([facility]) 09/25/2016  . PE (pulmonary embolism) 9/15  . Pericardial effusion 09/20/2016   Malignant effusion s/p pericardial drain  . Personal history of DVT (deep vein thrombosis) 9/15    ALLERGIES:  has No Known Allergies.  MEDICATIONS:  Current Outpatient Prescriptions  Medication Sig Dispense Refill  . atorvastatin (LIPITOR) 20 MG tablet Take 1 tablet (20 mg total) by mouth daily at 6 PM. 90 tablet 3  . colchicine 0.6 MG tablet Take 1 tablet (0.6 mg total) by  mouth 2 (two) times daily. 60 tablet 5  . prochlorperazine (COMPAZINE) 10 MG tablet Take 1 tablet (10 mg total) by mouth every 6 (six) hours as needed for nausea or vomiting. 30 tablet 0  . rivaroxaban (XARELTO) 20 MG TABS tablet Take 1 tablet (20 mg total) by mouth daily with supper. 30 tablet 5   No current facility-administered medications for this visit.     SURGICAL HISTORY:  Past Surgical History:  Procedure Laterality Date  . ANTERIOR FUSION CERVICAL SPINE  ~ 2005  . APPENDECTOMY  1978  . CARDIAC CATHETERIZATION N/A 09/21/2016   Procedure: Pericardiocentesis;  Surgeon: Jettie Booze, MD;  Location: [facility];  Service: Cardiovascular;  Laterality: N/A;  . COLONOSCOPY    . HEMIARTHROPLASTY SHOULDER FRACTURE Left 12/2008  . INGUINAL HERNIA REPAIR Bilateral 1992  . JOINT REPLACEMENT    . PERICARDIAL FLUID DRAINAGE  09/21/2016  . TOTAL HIP ARTHROPLASTY Left 05/23/2014   Procedure: TOTAL HIP ARTHROPLASTY;  Surgeon: Kerin Salen, MD;  Location: [facility];  Service: Orthopedics;  Laterality: Left;  . TOTAL HIP ARTHROPLASTY Right 05/15/2015   Procedure: TOTAL HIP ARTHROPLASTY;  Surgeon: Frederik Pear, MD;  Location: [facility];  Service: Orthopedics;  Laterality: Right;    REVIEW OF SYSTEMS:  A comprehensive review of systems was negative except for: Constitutional: positive for fatigue   PHYSICAL EXAMINATION: General appearance: alert, cooperative, fatigued and no distress Head: Normocephalic, without obvious abnormality, atraumatic Neck: no adenopathy, no JVD, supple, symmetrical, trachea midline and thyroid not enlarged, symmetric, no tenderness/mass/nodules Lymph nodes: Cervical, supraclavicular, and axillary nodes normal. Resp: clear  to auscultation bilaterally Back: symmetric, no curvature. ROM normal. No CVA tenderness. Cardio: regular rate and rhythm, S1, S2 normal, no murmur, click, rub or gallop GI: soft, non-tender; bowel sounds normal; no masses,  no  organomegaly Extremities: extremities normal, atraumatic, no cyanosis or edema  ECOG PERFORMANCE STATUS: 0 - Asymptomatic  Blood pressure 132/62, pulse 65, temperature 97.9 F (36.6 C), temperature source Oral, resp. rate 18, height '5\' 11"'$  (1.803 m), weight 204 lb (92.5 kg), SpO2 100 %.  LABORATORY DATA: Lab Results  Component Value Date   WBC 8.8 11/25/2016   HGB 16.3 11/25/2016   HCT 49.0 11/25/2016   MCV 94.2 11/25/2016   PLT 185 11/25/2016      Chemistry      Component Value Date/Time   NA 141 11/04/2016 1345   K 3.8 11/04/2016 1345   CL 107 09/23/2016 0557   CO2 23 11/04/2016 1345   BUN 11.2 11/04/2016 1345   CREATININE 0.9 11/04/2016 1345      Component Value Date/Time   CALCIUM 8.9 11/04/2016 1345   ALKPHOS 81 11/04/2016 1345   AST 26 11/04/2016 1345   ALT 29 11/04/2016 1345   BILITOT 0.36 11/04/2016 1345       RADIOGRAPHIC STUDIES: No results found.  ASSESSMENT AND PLAN:  This is a very pleasant 70 years old white male with stage IV non-small cell lung cancer, adenocarcinoma with PDL 1 expression of 80%. The patient is currently undergoing treatment with immunotherapy with [name] (pembrolizumab) status post 2 cycles. He continues to tolerate the treatment well except for mild fatigue. I recommended for the patient to proceed with cycle #3 today as scheduled. I will see him back for follow-up visit in 3 weeks for evaluation with repeat CT scan of the chest, abdomen and pelvis for restaging of his disease. He was advised to call immediately if he has any concerning symptoms in the interval. He was advised to call immediately if he has any concerning symptoms in the interval. The patient voices understanding of current disease status and treatment options and is in agreement with the current care plan.  All questions were answered. The patient knows to call the clinic with any problems, questions or concerns. We can certainly see the patient much sooner if  necessary. I spent 10 minutes counseling the patient face to face. The total time spent in the appointment was 15 minutes.  Disclaimer: This note was dictated with voice recognition software. Similar sounding words can inadvertently be transcribed and may not be corrected upon review.

## 2016-11-25 NOTE — Patient Instructions (Signed)
[facility] Discharge Instructions for Patients Receiving Chemotherapy  Today you received the following chemotherapy agent:  Keytruda To help prevent nausea and vomiting after your treatment, we encourage you to take your nausea medication as prescribed.   If you develop nausea and vomiting that is not controlled by your nausea medication, call the clinic.   BELOW ARE SYMPTOMS THAT SHOULD BE REPORTED IMMEDIATELY:  *FEVER GREATER THAN 100.5 F  *CHILLS WITH OR WITHOUT FEVER  NAUSEA AND VOMITING THAT IS NOT CONTROLLED WITH YOUR NAUSEA MEDICATION  *UNUSUAL SHORTNESS OF BREATH  *UNUSUAL BRUISING OR BLEEDING  TENDERNESS IN MOUTH AND THROAT WITH OR WITHOUT PRESENCE OF ULCERS  *URINARY PROBLEMS  *BOWEL PROBLEMS  UNUSUAL RASH Items with * indicate a potential emergency and should be followed up as soon as possible.  Feel free to call the clinic you have any questions or concerns. The clinic phone number is [phone].  Please show the CHEMO ALERT CARD at check-in to the Emergency Department and triage nurse.   

## 2016-11-25 NOTE — Telephone Encounter (Signed)
No changes to previously scheduled appointments, per 11/25/16 los. Labs, follow up and chemo, scheduled every 3 weeks, througoht 01/27/17. Patient was given a copy of the AVS report, per 11/25/16 los.

## 2016-12-08 NOTE — Progress Notes (Signed)
Follow-up Outpatient Visit Date: 12/11/2016  Chief Complaint: Hospital follow-up  HPI:  [name] is a 71 y.o. year-old male with history of recurrent pulmonary emboli, hypertension, hyperlipidemia and pericardial effusion with cardiac tamponade on requiring urgent pericardiocentesis on 09/21/16. Fluid analysis was notable for adenocarcinoma, leading to diagnosis of primary lung malignancy. I saw him for follow-up on 10/02/16, which limited echo at that time showing no significant pericardial effusion. Patient underwent CT chest as part of his cancer treatment 2 days ago, which showed no evidence of pericardial effusion. Today, the patient reports feeling well. He had a severe cold over the holidays, but has returned back to his baseline. He has completed 3 cycles of pembrolizumab for stage IV non-small cell lung cancer under the direction of Dr. [name]. Nicholas Hale reports considerable fatigue for about a week after receiving the infusion but otherwise is been without side effects. He has occasional shortness of breath when first getting up in the morning, which has been a long-standing issue for him. He denies chest pain, palpitations, and lightheadedness. At times, he has mild swelling in his legs, which has also been present for some time and is stable. He remains compliant with his medications, including colchicine. He inquires as to whether colchicine can be discontinued.  --------------------------------------------------------------------------------------------------  Cardiovascular History & Procedures: Cardiovascular Problems:  Malignant pericardial effusion  Recurrent pulmonary emboli  Risk Factors:  Hypertension, hyperlipidemia, male gender, and age greater than 52  Cath/PCI:  Pericardiocentesis (09/21/16): Successful pericardiocentesis with removal of 860 mL of serosanguineous fluid and resolution of pericardial effusion by echo.  CV Surgery:  None  EP Procedures and  Devices:  None  Non-Invasive Evaluation(s):  TTE (09/20/16): Large pericardial effusion with signs of early And not. LVEF 60-65% with normal wall motion. No significant valvular abnormalities.  Limited TTE (08/31/16): No significant pericardial effusion. Normal LVEF (55-60%).  Recent CV Pertinent Labs: Lab Results  Component Value Date   CHOL 94 09/20/2016   HDL 23 (L) 09/20/2016   LDLCALC 50 09/20/2016   TRIG 106 09/20/2016   CHOLHDL 4.1 09/20/2016   INR 1.27 09/19/2016   K 4.1 11/25/2016   MG 2.0 09/19/2016   BUN 17.9 11/25/2016   CREATININE 1.2 11/25/2016    Past medical and surgical history were reviewed and updated in EPIC.   Outpatient Encounter Prescriptions as of 12/11/2016  Medication Sig  . atorvastatin (LIPITOR) 20 MG tablet Take 1 tablet (20 mg total) by mouth daily at 6 PM.  . colchicine 0.6 MG tablet Take 1 tablet (0.6 mg total) by mouth 2 (two) times daily.  . prochlorperazine (COMPAZINE) 10 MG tablet Take 1 tablet (10 mg total) by mouth every 6 (six) hours as needed for nausea or vomiting.  . rivaroxaban (XARELTO) 20 MG TABS tablet Take 1 tablet (20 mg total) by mouth daily with supper.   No facility-administered encounter medications on file as of 12/11/2016.     Allergies: Patient has no known allergies.  Social History   Social History  . Marital status: Married    Spouse name: N/A  . Number of children: N/A  . Years of education: N/A   Occupational History  . Not on file.   Social History Main Topics  . Smoking status: Former Smoker    Packs/day: 0.50    Years: 40.00    Types: Cigarettes    Quit date: 05/05/2009  . Smokeless tobacco: Never Used  . Alcohol use No  . Drug use: No  . Sexual  activity: No   Other Topics Concern  . Not on file   Social History Narrative  . No narrative on file    Family History  Problem Relation Age of Onset  . Clotting disorder Mother   . Diabetes Mellitus I Mother   . Prostate cancer Father      Review of Systems: A 12-system review of systems was performed and was negative except as noted in the HPI.  --------------------------------------------------------------------------------------------------  Physical Exam: BP 136/84   Pulse 80   Ht '5\' 11"'$  (1.803 m)   Wt 210 lb (95.3 kg)   BMI 29.29 kg/m    General:  Overweight man seated comfortably in the exam room.  HEENT: No conjunctival pallor or scleral icterus.  Moist mucous membranes.  OP clear. Neck: No JVD or HJR. Lungs: Normal work of breathing.  Clear to auscultation bilaterally without wheezes or crackles. Heart: Regular rate and rhythm without murmurs, rubs, or gallops.  Non-displaced PMI. Abd: Bowel sounds present.  Soft, NT/ND without hepatosplenomegaly Ext: No lower extremity edema.  Radial, PT, and DP pulses are 2+ bilaterally. Skin: warm and dry without rash. Pericardial drain site is well-healed and barely visible.  CT chest (12/09/16): Similar to mildly increased thoracic adenopathy. Slight enlargement of left lower lobe pleural-based nodule. Coronary artery and aortic atherosclerosis noted. No pericardial effusion. (I have personally reviewed the images)  Lab Results  Component Value Date   WBC 8.8 11/25/2016   HGB 16.3 11/25/2016   HCT 49.0 11/25/2016   MCV 94.2 11/25/2016   PLT 185 11/25/2016    Lab Results  Component Value Date   NA 140 11/25/2016   K 4.1 11/25/2016   CL 107 09/23/2016   CO2 24 11/25/2016   BUN 17.9 11/25/2016   CREATININE 1.2 11/25/2016   GLUCOSE 88 11/25/2016   ALT 36 11/25/2016    Lab Results  Component Value Date   CHOL 94 09/20/2016   HDL 23 (L) 09/20/2016   LDLCALC 50 09/20/2016   TRIG 106 09/20/2016   CHOLHDL 4.1 09/20/2016    --------------------------------------------------------------------------------------------------  ASSESSMENT AND PLAN: Malignant pericardial effusion Mr. Boley is doing well. He does not have any new symptoms to suggest  reaccumulation of his pericardial effusion. Additionally, CT scan 2 days ago showed no evidence of pericardial fluid. Given that his effusion was malignant and he is currently receiving therapy for his non-small cell lung cancer, we have agreed to discontinue colchicine. The patient should contact us if he develops chest pain, shortness of breath, or worsening edema that could be seen with pericarditis or reaccumulation of pericardial fluid.  Recurrent pulmonary emboli No new symptoms. Patient is tolerating rivaroxaban well, which she will likely need to continue indefinitely.  Follow-up: Return to clinic in 6 months.  Nelva Bush, MD 12/08/2016 9:24 AM

## 2016-12-09 ENCOUNTER — Ambulatory Visit (HOSPITAL_COMMUNITY)
Admission: RE | Admit: 2016-12-09 | Discharge: 2016-12-09 | Disposition: A | Discharge status: Home / Self Care | Payer: Medicare Other | Source: Ambulatory Visit | Attending: Internal Medicine | Admitting: Internal Medicine

## 2016-12-09 DIAGNOSIS — I77811 Abdominal aortic ectasia: Secondary | ICD-10-CM | POA: Diagnosis not present

## 2016-12-09 DIAGNOSIS — I2510 Atherosclerotic heart disease of native coronary artery without angina pectoris: Secondary | ICD-10-CM | POA: Insufficient documentation

## 2016-12-09 DIAGNOSIS — Z96643 Presence of artificial hip joint, bilateral: Secondary | ICD-10-CM | POA: Diagnosis not present

## 2016-12-09 DIAGNOSIS — R911 Solitary pulmonary nodule: Secondary | ICD-10-CM | POA: Insufficient documentation

## 2016-12-09 DIAGNOSIS — R590 Localized enlarged lymph nodes: Secondary | ICD-10-CM | POA: Diagnosis not present

## 2016-12-09 DIAGNOSIS — C3490 Malignant neoplasm of unspecified part of unspecified bronchus or lung: Secondary | ICD-10-CM | POA: Diagnosis not present

## 2016-12-09 DIAGNOSIS — C3492 Malignant neoplasm of unspecified part of left bronchus or lung: Secondary | ICD-10-CM

## 2016-12-09 DIAGNOSIS — R5382 Chronic fatigue, unspecified: Secondary | ICD-10-CM

## 2016-12-09 DIAGNOSIS — I700 Atherosclerosis of aorta: Secondary | ICD-10-CM | POA: Diagnosis not present

## 2016-12-09 DIAGNOSIS — Z923 Personal history of irradiation: Secondary | ICD-10-CM | POA: Diagnosis present

## 2016-12-09 DIAGNOSIS — K8020 Calculus of gallbladder without cholecystitis without obstruction: Secondary | ICD-10-CM | POA: Diagnosis not present

## 2016-12-09 DIAGNOSIS — Z5112 Encounter for antineoplastic immunotherapy: Secondary | ICD-10-CM

## 2016-12-09 MED ORDER — IOPAMIDOL (ISOVUE-300) INJECTION 61%
100.0000 mL | Freq: Once | INTRAVENOUS | Status: AC | PRN
  Administered 2016-12-09: 100 mL via INTRAVENOUS

## 2016-12-09 MED ORDER — IOPAMIDOL (ISOVUE-300) INJECTION 61%
INTRAVENOUS | Status: AC
  Filled 2016-12-09: qty 100

## 2016-12-11 ENCOUNTER — Encounter: Payer: Self-pay | Admitting: Internal Medicine

## 2016-12-11 ENCOUNTER — Ambulatory Visit (INDEPENDENT_AMBULATORY_CARE_PROVIDER_SITE_OTHER): Payer: Medicare Other | Admitting: Internal Medicine

## 2016-12-11 VITALS — BP 136/84 | HR 80 | Ht 71.0 in | Wt 210.0 lb

## 2016-12-11 DIAGNOSIS — I3131 Malignant pericardial effusion in diseases classified elsewhere: Secondary | ICD-10-CM

## 2016-12-11 DIAGNOSIS — I313 Pericardial effusion (noninflammatory): Principal | ICD-10-CM

## 2016-12-11 DIAGNOSIS — I2699 Other pulmonary embolism without acute cor pulmonale: Secondary | ICD-10-CM | POA: Diagnosis not present

## 2016-12-11 DIAGNOSIS — I318 Other specified diseases of pericardium: Secondary | ICD-10-CM | POA: Diagnosis not present

## 2016-12-11 DIAGNOSIS — C801 Malignant (primary) neoplasm, unspecified: Secondary | ICD-10-CM

## 2016-12-11 NOTE — Patient Instructions (Addendum)
Medication Instructions:  You can stop colchicine.   Labwork: None   Testing/Procedures: None   Follow-Up: Your physician wants you to follow-up in: 6 months with Dr End. (July 2018).  You will receive a reminder letter in the mail two months in advance. If you don't receive a letter, please call our office to schedule the follow-up appointment.        If you need a refill on your cardiac medications before your next appointment, please call your pharmacy.

## 2016-12-16 ENCOUNTER — Other Ambulatory Visit (HOSPITAL_BASED_OUTPATIENT_CLINIC_OR_DEPARTMENT_OTHER): Payer: Medicare Other

## 2016-12-16 ENCOUNTER — Ambulatory Visit (HOSPITAL_BASED_OUTPATIENT_CLINIC_OR_DEPARTMENT_OTHER): Payer: Medicare Other | Admitting: Internal Medicine

## 2016-12-16 ENCOUNTER — Encounter: Payer: Self-pay | Admitting: Internal Medicine

## 2016-12-16 ENCOUNTER — Ambulatory Visit (HOSPITAL_BASED_OUTPATIENT_CLINIC_OR_DEPARTMENT_OTHER): Payer: Medicare Other

## 2016-12-16 VITALS — BP 150/53 | HR 77 | Temp 98.7°F | Resp 18 | Ht 71.0 in | Wt 206.9 lb

## 2016-12-16 DIAGNOSIS — C3492 Malignant neoplasm of unspecified part of left bronchus or lung: Secondary | ICD-10-CM

## 2016-12-16 DIAGNOSIS — C3432 Malignant neoplasm of lower lobe, left bronchus or lung: Secondary | ICD-10-CM | POA: Diagnosis not present

## 2016-12-16 DIAGNOSIS — Z79899 Other long term (current) drug therapy: Secondary | ICD-10-CM

## 2016-12-16 DIAGNOSIS — Z7901 Long term (current) use of anticoagulants: Secondary | ICD-10-CM | POA: Diagnosis not present

## 2016-12-16 DIAGNOSIS — I2699 Other pulmonary embolism without acute cor pulmonale: Secondary | ICD-10-CM | POA: Diagnosis not present

## 2016-12-16 DIAGNOSIS — Z5112 Encounter for antineoplastic immunotherapy: Secondary | ICD-10-CM | POA: Diagnosis present

## 2016-12-16 DIAGNOSIS — I824Y3 Acute embolism and thrombosis of unspecified deep veins of proximal lower extremity, bilateral: Secondary | ICD-10-CM

## 2016-12-16 DIAGNOSIS — I10 Essential (primary) hypertension: Secondary | ICD-10-CM

## 2016-12-16 DIAGNOSIS — R5382 Chronic fatigue, unspecified: Secondary | ICD-10-CM

## 2016-12-16 LAB — CBC WITH DIFFERENTIAL/PLATELET
BASO%: 0.4 % (ref 0.0–2.0)
BASOS ABS: 0 10*3/uL (ref 0.0–0.1)
EOS%: 5.7 % (ref 0.0–7.0)
Eosinophils Absolute: 0.4 10*3/uL (ref 0.0–0.5)
HCT: 49.3 % (ref 38.4–49.9)
HGB: 16.8 g/dL (ref 13.0–17.1)
LYMPH%: 26.3 % (ref 14.0–49.0)
MCH: 32 pg (ref 27.2–33.4)
MCHC: 34.1 g/dL (ref 32.0–36.0)
MCV: 93.9 fL (ref 79.3–98.0)
MONO#: 0.5 10*3/uL (ref 0.1–0.9)
MONO%: 7 % (ref 0.0–14.0)
NEUT#: 4.2 10*3/uL (ref 1.5–6.5)
NEUT%: 60.6 % (ref 39.0–75.0)
PLATELETS: 182 10*3/uL (ref 140–400)
RBC: 5.25 10*6/uL (ref 4.20–5.82)
RDW: 13.7 % (ref 11.0–14.6)
WBC: 7 10*3/uL (ref 4.0–10.3)
lymph#: 1.8 10*3/uL (ref 0.9–3.3)

## 2016-12-16 LAB — COMPREHENSIVE METABOLIC PANEL
ALT: 30 U/L (ref 0–55)
ANION GAP: 11 meq/L (ref 3–11)
AST: 22 U/L (ref 5–34)
Albumin: 3.3 g/dL — ABNORMAL LOW (ref 3.5–5.0)
Alkaline Phosphatase: 77 U/L (ref 40–150)
BILIRUBIN TOTAL: 0.44 mg/dL (ref 0.20–1.20)
BUN: 17.6 mg/dL (ref 7.0–26.0)
CO2: 22 mEq/L (ref 22–29)
Calcium: 9.2 mg/dL (ref 8.4–10.4)
Chloride: 108 mEq/L (ref 98–109)
Creatinine: 1.1 mg/dL (ref 0.7–1.3)
EGFR: 68 mL/min/{1.73_m2} — AB (ref >90)
GLUCOSE: 112 mg/dL (ref 70–140)
POTASSIUM: 4 meq/L (ref 3.5–5.1)
SODIUM: 141 meq/L (ref 136–145)
Total Protein: 7.3 g/dL (ref 6.4–8.3)

## 2016-12-16 LAB — TSH: TSH: 3.242 m(IU)/L (ref 0.320–4.118)

## 2016-12-16 MED ORDER — SODIUM CHLORIDE 0.9 % IV SOLN
200.0000 mg | Freq: Once | INTRAVENOUS | Status: AC
  Administered 2016-12-16: 200 mg via INTRAVENOUS
  Filled 2016-12-16: qty 8

## 2016-12-16 MED ORDER — SODIUM CHLORIDE 0.9 % IV SOLN
Freq: Once | INTRAVENOUS | Status: AC
  Administered 2016-12-16: 12:00:00 via INTRAVENOUS

## 2016-12-16 NOTE — Patient Instructions (Signed)
[facility] Discharge Instructions for Patients Receiving Chemotherapy  Today you received the following chemotherapy agents: Keytruda   To help prevent nausea and vomiting after your treatment, we encourage you to take your nausea medication as directed    If you develop nausea and vomiting that is not controlled by your nausea medication, call the clinic.   BELOW ARE SYMPTOMS THAT SHOULD BE REPORTED IMMEDIATELY:  *FEVER GREATER THAN 100.5 F  *CHILLS WITH OR WITHOUT FEVER  NAUSEA AND VOMITING THAT IS NOT CONTROLLED WITH YOUR NAUSEA MEDICATION  *UNUSUAL SHORTNESS OF BREATH  *UNUSUAL BRUISING OR BLEEDING  TENDERNESS IN MOUTH AND THROAT WITH OR WITHOUT PRESENCE OF ULCERS  *URINARY PROBLEMS  *BOWEL PROBLEMS  UNUSUAL RASH Items with * indicate a potential emergency and should be followed up as soon as possible.  Feel free to call the clinic you have any questions or concerns. The clinic phone number is [phone].  Please show the CHEMO ALERT CARD at check-in to the Emergency Department and triage nurse.   

## 2016-12-16 NOTE — Progress Notes (Signed)
[facility] [phone]   [phone]  OFFICE PROGRESS NOTE  [name], MD [place] [address]  DIAGNOSIS: Stage IV (T1a, N3, M1b) non-small cell lung cancer, adenocarcinoma with PDL 1 expression of 80%. 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  PRIOR THERAPY: None.  CURRENT THERAPY: Immunotherapy with [name] (pembrolizumab) 200 mg IV every 3 weeks status post 3 cycles.  INTERVAL HISTORY: Nicholas Hale 70 y.o. male returns to the clinic today for follow-up visit accompanied by his wife. The patient is feeling fine today with no specific complaints. He is tolerating his current treatment with [name] (pembrolizumab) fairly well with no significant adverse effects. He denied having any skin rash or diarrhea. He has no nausea or vomiting. He denied having any chest pain, shortness of breath, cough or hemoptysis. He has no fever or chills. The patient denied having any significant weight loss or night sweats. He had repeat CT scan of the chest, abdomen and pelvis performed recently and he is here for evaluation and discussion of his scan results.  MEDICAL HISTORY: Past Medical History:  Diagnosis Date  . Abdominal aortic aneurysm ([place])   . Arthritis    "hips; lower spine" (05/24/2014)  . Chronic fatigue 10/05/2016  . Hyperlipidemia   . Hypertension   . Lung cancer ([place]) 09/25/2016  . PE (pulmonary embolism) 9/15  . Pericardial effusion 09/20/2016   Malignant effusion s/p pericardial drain  . Personal history of DVT (deep vein thrombosis) 9/15    ALLERGIES:  has No Known Allergies.  MEDICATIONS:  Current Outpatient Prescriptions  Medication Sig Dispense Refill  . atorvastatin (LIPITOR) 20 MG tablet Take 1 tablet (20 mg total) by  mouth daily at 6 PM. 90 tablet 3  . COLCRYS 0.6 MG tablet Take 0.6 mg by mouth 2 (two) times daily.    . prochlorperazine (COMPAZINE) 10 MG tablet Take 1 tablet (10 mg total) by mouth every 6 (six) hours as needed for nausea or vomiting. 30 tablet 0  . rivaroxaban (XARELTO) 20 MG TABS tablet Take 1 tablet (20 mg total) by mouth daily with supper. 30 tablet 5   No current facility-administered medications for this visit.     SURGICAL HISTORY:  Past Surgical History:  Procedure Laterality Date  . ANTERIOR FUSION CERVICAL SPINE  ~ 2005  . APPENDECTOMY  1978  . CARDIAC CATHETERIZATION N/A 09/21/2016   Procedure: Pericardiocentesis;  Surgeon: Jettie Booze, MD;  Location: [facility];  Service: Cardiovascular;  Laterality: N/A;  . COLONOSCOPY    . HEMIARTHROPLASTY SHOULDER FRACTURE Left 12/2008  . INGUINAL HERNIA REPAIR Bilateral 1992  . JOINT REPLACEMENT    . PERICARDIAL FLUID DRAINAGE  09/21/2016  . TOTAL HIP ARTHROPLASTY Left 05/23/2014   Procedure: TOTAL HIP ARTHROPLASTY;  Surgeon: Kerin Salen, MD;  Location: [facility];  Service: Orthopedics;  Laterality: Left;  . TOTAL HIP ARTHROPLASTY Right 05/15/2015   Procedure: TOTAL HIP ARTHROPLASTY;  Surgeon: Frederik Pear, MD;  Location: [facility];  Service: Orthopedics;  Laterality: Right;    REVIEW OF SYSTEMS:  Constitutional: negative Eyes: negative Ears, nose, mouth, throat, and face: negative Respiratory: negative Cardiovascular: negative Gastrointestinal: negative Genitourinary:negative Integument/breast: negative Hematologic/lymphatic: negative Musculoskeletal:negative Neurological: negative Behavioral/Psych: negative Endocrine: negative Allergic/Immunologic: negative   PHYSICAL EXAMINATION: General appearance: alert, cooperative and no distress Head:  Normocephalic, without obvious abnormality, atraumatic Neck: no adenopathy, no JVD, supple, symmetrical, trachea midline and thyroid not enlarged, symmetric, no  tenderness/mass/nodules Lymph nodes: Cervical, supraclavicular, and axillary nodes normal. Resp: clear to auscultation bilaterally Back: symmetric, no curvature. ROM normal. No CVA tenderness. Cardio: regular rate and rhythm, S1, S2 normal, no murmur, click, rub or gallop GI: soft, non-tender; bowel sounds normal; no masses,  no organomegaly Extremities: extremities normal, atraumatic, no cyanosis or edema Neurologic: Alert and oriented X 3, normal strength and tone. Normal symmetric reflexes. Normal coordination and gait  ECOG PERFORMANCE STATUS: 0 - Asymptomatic  Blood pressure (!) 150/53, pulse 77, temperature 98.7 F (37.1 C), temperature source Oral, resp. rate 18, height '5\' 11"'$  (1.803 m), weight 206 lb 14.4 oz (93.8 kg), SpO2 98 %.  LABORATORY DATA: Lab Results  Component Value Date   WBC 7.0 12/16/2016   HGB 16.8 12/16/2016   HCT 49.3 12/16/2016   MCV 93.9 12/16/2016   PLT 182 12/16/2016      Chemistry      Component Value Date/Time   NA 141 12/16/2016 1012   K 4.0 12/16/2016 1012   CL 107 09/23/2016 0557   CO2 22 12/16/2016 1012   BUN 17.6 12/16/2016 1012   CREATININE 1.1 12/16/2016 1012      Component Value Date/Time   CALCIUM 9.2 12/16/2016 1012   ALKPHOS 77 12/16/2016 1012   AST 22 12/16/2016 1012   ALT 30 12/16/2016 1012   BILITOT 0.44 12/16/2016 1012       RADIOGRAPHIC STUDIES: Ct Chest W Contrast  Result Date: 12/09/2016 CLINICAL DATA:  Lung cancer diagnosed 10/17 with ongoing immunotherapy. Chronic fatigue. EXAM: CT CHEST, ABDOMEN, AND PELVIS WITH CONTRAST TECHNIQUE: Multidetector CT imaging of the chest, abdomen and pelvis was performed following the standard protocol during bolus administration of intravenous contrast. CONTRAST:  123m ISOVUE-300 IOPAMIDOL (ISOVUE-300) INJECTION 61% COMPARISON:  PET of 10/05/2016. Chest CT 09/19/2016. No prior dedicated abdominopelvic CTs. FINDINGS: CT CHEST FINDINGS Cardiovascular: Advanced aortic and branch vessel  atherosclerosis. Normal heart size, without pericardial effusion. Lad coronary artery atherosclerosis. No central pulmonary embolism, on this non-dedicated study. Mediastinum/Nodes: 10 mm right paratracheal node is unchanged on image 26/series 2. Right infrahilar node measures 9 mm on image 41/series 2 versus similar on the prior PET. Left infrahilar node is enlarged at 1.4 cm on image 35/series 2 versus 9 mm previously. Lungs/Pleura: No pleural fluid. Moderate centrilobular and paraseptal emphysema with lower lobe predominant bronchial wall thickening. Subpleural right upper lobe scarring. Thickening along the right minor fissure on image 86/series 4. Minimal subpleural right greater than left base patchy airspace disease, including on image 134/series 4. Felt to be similar to 09/18/16, favoring atelectasis. Pleural-based left lower lobe nodule measures 14 x 8 mm on image 86/ series 4. Compare 12 x 6 mm on 09/19/2016 (when remeasured). Musculoskeletal: Left proximal humerus fixation. Lower cervical spine fixation. CT ABDOMEN PELVIS FINDINGS Hepatobiliary: Left hepatic lobe cyst. No suspicious liver lesion. Small gallstones out acute cholecystitis or biliary duct dilatation. Pancreas: Normal, without mass or ductal dilatation. Spleen: Normal in size, without focal abnormality. Adrenals/Urinary Tract: Normal adrenal glands. Right renal minimally complex/ septated cyst measures 11.7 x 12.6 cm. Smaller right renal cysts. Too small to characterize lesions in both kidneys. No hydronephrosis. Degraded evaluation of the pelvis, secondary to beam hardening artifact from bilateral hip arthroplasty. Grossly normal appearance of the urinary bladder. Stomach/Bowel: Normal stomach, without wall thickening. Scattered colonic diverticula. Normal small bowel. Vascular/Lymphatic: Advanced aortic and branch  vessel atherosclerosis. Infrarenal aortic ectasia at 3.2 cm, similar. No surrounding hemorrhage. Aortocaval node measures 11 mm  on image 77/series 2 and is similar to on the prior PET (when remeasured). No pelvic sidewall adenopathy. Reproductive: Grossly normal prostate. Other: No significant free fluid. No evidence of omental or peritoneal disease. Musculoskeletal: Bilateral hip arthroplasty. Degenerative partial fusion of the bilateral sacroiliac joints. IMPRESSION: 1. Similar to mild increase in thoracic adenopathy. 2. Apparent slight enlargement of a left lower lobe pleural-based nodule which may represent the primary lesion. 3. Similar mild upper abdominal adenopathy. 4.  Coronary artery atherosclerosis. Aortic atherosclerosis. 5. Cholelithiasis. 6. Infrarenal abdominal aortic ectasia. 7. Degraded evaluation of the pelvis, secondary to beam hardening artifact from bilateral hip arthroplasties. Electronically Signed   By: Abigail Miyamoto M.D.   On: 12/09/2016 16:02   Ct Abdomen Pelvis W Contrast  Result Date: 12/09/2016 CLINICAL DATA:  Lung cancer diagnosed 10/17 with ongoing immunotherapy. Chronic fatigue. EXAM: CT CHEST, ABDOMEN, AND PELVIS WITH CONTRAST TECHNIQUE: Multidetector CT imaging of the chest, abdomen and pelvis was performed following the standard protocol during bolus administration of intravenous contrast. CONTRAST:  166m ISOVUE-300 IOPAMIDOL (ISOVUE-300) INJECTION 61% COMPARISON:  PET of 10/05/2016. Chest CT 09/19/2016. No prior dedicated abdominopelvic CTs. FINDINGS: CT CHEST FINDINGS Cardiovascular: Advanced aortic and branch vessel atherosclerosis. Normal heart size, without pericardial effusion. Lad coronary artery atherosclerosis. No central pulmonary embolism, on this non-dedicated study. Mediastinum/Nodes: 10 mm right paratracheal node is unchanged on image 26/series 2. Right infrahilar node measures 9 mm on image 41/series 2 versus similar on the prior PET. Left infrahilar node is enlarged at 1.4 cm on image 35/series 2 versus 9 mm previously. Lungs/Pleura: No pleural fluid. Moderate centrilobular and  paraseptal emphysema with lower lobe predominant bronchial wall thickening. Subpleural right upper lobe scarring. Thickening along the right minor fissure on image 86/series 4. Minimal subpleural right greater than left base patchy airspace disease, including on image 134/series 4. Felt to be similar to 09/18/16, favoring atelectasis. Pleural-based left lower lobe nodule measures 14 x 8 mm on image 86/ series 4. Compare 12 x 6 mm on 09/19/2016 (when remeasured). Musculoskeletal: Left proximal humerus fixation. Lower cervical spine fixation. CT ABDOMEN PELVIS FINDINGS Hepatobiliary: Left hepatic lobe cyst. No suspicious liver lesion. Small gallstones out acute cholecystitis or biliary duct dilatation. Pancreas: Normal, without mass or ductal dilatation. Spleen: Normal in size, without focal abnormality. Adrenals/Urinary Tract: Normal adrenal glands. Right renal minimally complex/ septated cyst measures 11.7 x 12.6 cm. Smaller right renal cysts. Too small to characterize lesions in both kidneys. No hydronephrosis. Degraded evaluation of the pelvis, secondary to beam hardening artifact from bilateral hip arthroplasty. Grossly normal appearance of the urinary bladder. Stomach/Bowel: Normal stomach, without wall thickening. Scattered colonic diverticula. Normal small bowel. Vascular/Lymphatic: Advanced aortic and branch vessel atherosclerosis. Infrarenal aortic ectasia at 3.2 cm, similar. No surrounding hemorrhage. Aortocaval node measures 11 mm on image 77/series 2 and is similar to on the prior PET (when remeasured). No pelvic sidewall adenopathy. Reproductive: Grossly normal prostate. Other: No significant free fluid. No evidence of omental or peritoneal disease. Musculoskeletal: Bilateral hip arthroplasty. Degenerative partial fusion of the bilateral sacroiliac joints. IMPRESSION: 1. Similar to mild increase in thoracic adenopathy. 2. Apparent slight enlargement of a left lower lobe pleural-based nodule which may  represent the primary lesion. 3. Similar mild upper abdominal adenopathy. 4.  Coronary artery atherosclerosis. Aortic atherosclerosis. 5. Cholelithiasis. 6. Infrarenal abdominal aortic ectasia. 7. Degraded evaluation of the pelvis, secondary to beam hardening artifact from  bilateral hip arthroplasties. Electronically Signed   By: Abigail Miyamoto M.D.   On: 12/09/2016 16:02    ASSESSMENT AND PLAN: This is a very pleasant 71 years old white male with stage IV non-small cell lung cancer, adenocarcinoma with positive PDL 1 expression of 80%. He is currently undergoing treatment with immunotherapy with [name] (pembrolizumab) status post 3 cycles. The patient is tolerating his treatment well with no significant adverse effects. He has repeat CT scan of the chest, abdomen and pelvis performed recently. I personally and independently reviewed the scan images and discuss the results with the patient and his wife today. The scan showed mild increase in some of the thoracic lymphadenopathy. This probably could be pseudo-progression with the current treatment with immunotherapy. I recommended for the patient to continue on his current treatment with [place] (pembrolizumab) and he will proceed with cycle #4 today. I will continue to monitor his disease closely on the upcoming scan and if there is any further disease progression, I would consider changing his treatment with systemic chemotherapy. For hypertension, I recommended for the patient to monitor his blood pressure closely at home and to reconsult with his primary care physician if persisted to be elevated. For the pulmonary embolism the patient would continue his current treatment with Xarelto. He would come back for follow-up visit in 3 weeks for evaluation with the next cycle of his treatment. He was advised to call immediately if he has any concerning symptoms in the interval. The patient voices understanding of current disease status and treatment  options and is in agreement with the current care plan.  All questions were answered. The patient knows to call the clinic with any problems, questions or concerns. We can certainly see the patient much sooner if necessary.  I spent 15 minutes counseling the patient face to face. The total time spent in the appointment was 25 minutes.  Disclaimer: This note was dictated with voice recognition software. Similar sounding words can inadvertently be transcribed and may not be corrected upon review.

## 2017-01-06 ENCOUNTER — Ambulatory Visit (HOSPITAL_BASED_OUTPATIENT_CLINIC_OR_DEPARTMENT_OTHER): Payer: Medicare Other

## 2017-01-06 ENCOUNTER — Other Ambulatory Visit (HOSPITAL_BASED_OUTPATIENT_CLINIC_OR_DEPARTMENT_OTHER): Payer: Medicare Other

## 2017-01-06 ENCOUNTER — Encounter: Payer: Self-pay | Admitting: Internal Medicine

## 2017-01-06 ENCOUNTER — Ambulatory Visit (HOSPITAL_BASED_OUTPATIENT_CLINIC_OR_DEPARTMENT_OTHER): Payer: Medicare Other | Admitting: Internal Medicine

## 2017-01-06 VITALS — BP 136/72 | HR 68 | Temp 98.1°F | Resp 18 | Wt 206.6 lb

## 2017-01-06 DIAGNOSIS — C3432 Malignant neoplasm of lower lobe, left bronchus or lung: Secondary | ICD-10-CM

## 2017-01-06 DIAGNOSIS — C3492 Malignant neoplasm of unspecified part of left bronchus or lung: Secondary | ICD-10-CM

## 2017-01-06 DIAGNOSIS — Z5112 Encounter for antineoplastic immunotherapy: Secondary | ICD-10-CM

## 2017-01-06 DIAGNOSIS — R5382 Chronic fatigue, unspecified: Secondary | ICD-10-CM

## 2017-01-06 DIAGNOSIS — Z79899 Other long term (current) drug therapy: Secondary | ICD-10-CM

## 2017-01-06 LAB — COMPREHENSIVE METABOLIC PANEL
ALT: 30 U/L (ref 0–55)
AST: 26 U/L (ref 5–34)
Albumin: 3.7 g/dL (ref 3.5–5.0)
Alkaline Phosphatase: 75 U/L (ref 40–150)
Anion Gap: 9 mEq/L (ref 3–11)
BILIRUBIN TOTAL: 0.53 mg/dL (ref 0.20–1.20)
BUN: 15 mg/dL (ref 7.0–26.0)
CHLORIDE: 106 meq/L (ref 98–109)
CO2: 24 meq/L (ref 22–29)
CREATININE: 1 mg/dL (ref 0.7–1.3)
Calcium: 9.5 mg/dL (ref 8.4–10.4)
EGFR: 77 mL/min/{1.73_m2} — ABNORMAL LOW (ref >90)
GLUCOSE: 94 mg/dL (ref 70–140)
Potassium: 4.5 mEq/L (ref 3.5–5.1)
SODIUM: 138 meq/L (ref 136–145)
TOTAL PROTEIN: 7.5 g/dL (ref 6.4–8.3)

## 2017-01-06 LAB — CBC WITH DIFFERENTIAL/PLATELET
BASO%: 0.6 % (ref 0.0–2.0)
Basophils Absolute: 0 10*3/uL (ref 0.0–0.1)
EOS%: 4.8 % (ref 0.0–7.0)
Eosinophils Absolute: 0.3 10*3/uL (ref 0.0–0.5)
HCT: 53.4 % — ABNORMAL HIGH (ref 38.4–49.9)
HGB: 17.3 g/dL — ABNORMAL HIGH (ref 13.0–17.1)
LYMPH%: 25.8 % (ref 14.0–49.0)
MCH: 31.5 pg (ref 27.2–33.4)
MCHC: 32.4 g/dL (ref 32.0–36.0)
MCV: 97.3 fL (ref 79.3–98.0)
MONO#: 0.6 10*3/uL (ref 0.1–0.9)
MONO%: 8.7 % (ref 0.0–14.0)
NEUT%: 60.1 % (ref 39.0–75.0)
NEUTROS ABS: 4.3 10*3/uL (ref 1.5–6.5)
PLATELETS: 166 10*3/uL (ref 140–400)
RBC: 5.49 10*6/uL (ref 4.20–5.82)
RDW: 13.9 % (ref 11.0–14.6)
WBC: 7.1 10*3/uL (ref 4.0–10.3)
lymph#: 1.8 10*3/uL (ref 0.9–3.3)

## 2017-01-06 LAB — TSH: TSH: 3.381 m[IU]/L (ref 0.320–4.118)

## 2017-01-06 MED ORDER — PEMBROLIZUMAB CHEMO INJECTION 100 MG/4ML
200.0000 mg | Freq: Once | INTRAVENOUS | Status: AC
  Administered 2017-01-06: 200 mg via INTRAVENOUS
  Filled 2017-01-06: qty 8

## 2017-01-06 MED ORDER — SODIUM CHLORIDE 0.9 % IV SOLN
Freq: Once | INTRAVENOUS | Status: AC
  Administered 2017-01-06: 13:00:00 via INTRAVENOUS

## 2017-01-06 NOTE — Patient Instructions (Signed)
[facility] Discharge Instructions for Patients Receiving Chemotherapy  Today you received the following chemotherapy agents:  Keytruda (pembrolizumab)  To help prevent nausea and vomiting after your treatment, we encourage you to take your nausea medication as prescribed.   If you develop nausea and vomiting that is not controlled by your nausea medication, call the clinic.   BELOW ARE SYMPTOMS THAT SHOULD BE REPORTED IMMEDIATELY:  *FEVER GREATER THAN 100.5 F  *CHILLS WITH OR WITHOUT FEVER  NAUSEA AND VOMITING THAT IS NOT CONTROLLED WITH YOUR NAUSEA MEDICATION  *UNUSUAL SHORTNESS OF BREATH  *UNUSUAL BRUISING OR BLEEDING  TENDERNESS IN MOUTH AND THROAT WITH OR WITHOUT PRESENCE OF ULCERS  *URINARY PROBLEMS  *BOWEL PROBLEMS  UNUSUAL RASH Items with * indicate a potential emergency and should be followed up as soon as possible.  Feel free to call the clinic you have any questions or concerns. The clinic phone number is [phone].  Please show the [facility] at check-in to the Emergency Department and triage nurse.

## 2017-01-06 NOTE — Progress Notes (Signed)
Nicholas Hale [phone]   Fax:([phone]  OFFICE PROGRESS NOTE  [name], MD [place] [address]  DIAGNOSIS: Stage IV (T1a, N3, M1b) non-small cell lung cancer, adenocarcinoma with PDL 1 expression of 80%. 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  PRIOR THERAPY: None.  CURRENT THERAPY: Immunotherapy with [name] (pembrolizumab) 200 mg IV every 3 weeks status post 4 cycles.  INTERVAL HISTORY: Nicholas Hale 71 y.o. male came to the clinic today for follow-up visit accompanied by his wife. The patient is currently on treatment with immunotherapy with [name] (pembrolizumab) status post 4 cycles. He is tolerating his treatment well with no significant adverse effects. He denied having any significant chest pain, shortness breath, cough or hemoptysis. He denied having any weight loss or night sweats. He has no nausea, vomiting, diarrhea or constipation. He has some itching after treatment and he is using Benadryl. He is here today for evaluation before starting cycle #5.  MEDICAL HISTORY: Past Medical History:  Diagnosis Date  . Abdominal aortic aneurysm ([place])   . Arthritis    "hips; lower spine" (05/24/2014)  . Chronic fatigue 10/05/2016  . Hyperlipidemia   . Hypertension   . Lung cancer ([place]) 09/25/2016  . PE (pulmonary embolism) 9/15  . Pericardial effusion 09/20/2016   Malignant effusion s/p pericardial drain  . Personal history of DVT (deep vein thrombosis) 9/15    ALLERGIES:  has No Known Allergies.  MEDICATIONS:  Current Outpatient Prescriptions  Medication Sig Dispense Refill  . atorvastatin (LIPITOR) 20 MG tablet Take 1 tablet (20 mg total) by mouth daily at 6 PM. 90 tablet 3  . COLCRYS 0.6 MG tablet Take 0.6 mg by mouth 2  (two) times daily.    . prochlorperazine (COMPAZINE) 10 MG tablet Take 1 tablet (10 mg total) by mouth every 6 (six) hours as needed for nausea or vomiting. 30 tablet 0  . rivaroxaban (XARELTO) 20 MG TABS tablet Take 1 tablet (20 mg total) by mouth daily with supper. 30 tablet 5   No current facility-administered medications for this visit.     SURGICAL HISTORY:  Past Surgical History:  Procedure Laterality Date  . ANTERIOR FUSION CERVICAL SPINE  ~ 2005  . APPENDECTOMY  1978  . CARDIAC CATHETERIZATION N/A 09/21/2016   Procedure: Pericardiocentesis;  Surgeon: Jettie Booze, MD;  Location: [facility];  Service: Cardiovascular;  Laterality: N/A;  . COLONOSCOPY    . HEMIARTHROPLASTY SHOULDER FRACTURE Left 12/2008  . INGUINAL HERNIA REPAIR Bilateral 1992  . JOINT REPLACEMENT    . PERICARDIAL FLUID DRAINAGE  09/21/2016  . TOTAL HIP ARTHROPLASTY Left 05/23/2014   Procedure: TOTAL HIP ARTHROPLASTY;  Surgeon: Kerin Salen, MD;  Location: [facility];  Service: Orthopedics;  Laterality: Left;  . TOTAL HIP ARTHROPLASTY Right 05/15/2015   Procedure: TOTAL HIP ARTHROPLASTY;  Surgeon: Frederik Pear, MD;  Location: [facility];  Service: Orthopedics;  Laterality: Right;    REVIEW OF SYSTEMS:  A comprehensive review of systems was negative.   PHYSICAL EXAMINATION: General appearance: alert, cooperative and no distress Head: Normocephalic, without obvious abnormality, atraumatic Neck: no adenopathy, no JVD, supple, symmetrical, trachea midline and thyroid not enlarged, symmetric, no tenderness/mass/nodules Lymph nodes: Cervical, supraclavicular, and axillary nodes normal. Resp: clear to auscultation bilaterally Back: symmetric, no curvature. ROM normal. No CVA  tenderness. Cardio: regular rate and rhythm, S1, S2 normal, no murmur, click, rub or gallop GI: soft, non-tender; bowel sounds normal; no masses,  no organomegaly Extremities: extremities normal, atraumatic, no cyanosis or edema  ECOG  PERFORMANCE STATUS: 0 - Asymptomatic  Blood pressure 136/72, pulse 68, temperature 98.1 F (36.7 C), temperature source Oral, resp. rate 18, weight 206 lb 9.6 oz (93.7 kg), SpO2 98 %.  LABORATORY DATA: Lab Results  Component Value Date   WBC 7.1 01/06/2017   HGB 17.3 (H) 01/06/2017   HCT 53.4 (H) 01/06/2017   MCV 97.3 01/06/2017   PLT 166 01/06/2017      Chemistry      Component Value Date/Time   NA 138 01/06/2017 1121   K 4.5 01/06/2017 1121   CL 107 09/23/2016 0557   CO2 24 01/06/2017 1121   BUN 15.0 01/06/2017 1121   CREATININE 1.0 01/06/2017 1121      Component Value Date/Time   CALCIUM 9.5 01/06/2017 1121   ALKPHOS 75 01/06/2017 1121   AST 26 01/06/2017 1121   ALT 30 01/06/2017 1121   BILITOT 0.53 01/06/2017 1121       RADIOGRAPHIC STUDIES: Ct Chest W Contrast  Result Date: 12/09/2016 CLINICAL DATA:  Lung cancer diagnosed 10/17 with ongoing immunotherapy. Chronic fatigue. EXAM: CT CHEST, ABDOMEN, AND PELVIS WITH CONTRAST TECHNIQUE: Multidetector CT imaging of the chest, abdomen and pelvis was performed following the standard protocol during bolus administration of intravenous contrast. CONTRAST:  116m ISOVUE-300 IOPAMIDOL (ISOVUE-300) INJECTION 61% COMPARISON:  PET of 10/05/2016. Chest CT 09/19/2016. No prior dedicated abdominopelvic CTs. FINDINGS: CT CHEST FINDINGS Cardiovascular: Advanced aortic and branch vessel atherosclerosis. Normal heart size, without pericardial effusion. Lad coronary artery atherosclerosis. No central pulmonary embolism, on this non-dedicated study. Mediastinum/Nodes: 10 mm right paratracheal node is unchanged on image 26/series 2. Right infrahilar node measures 9 mm on image 41/series 2 versus similar on the prior PET. Left infrahilar node is enlarged at 1.4 cm on image 35/series 2 versus 9 mm previously. Lungs/Pleura: No pleural fluid. Moderate centrilobular and paraseptal emphysema with lower lobe predominant bronchial wall thickening.  Subpleural right upper lobe scarring. Thickening along the right minor fissure on image 86/series 4. Minimal subpleural right greater than left base patchy airspace disease, including on image 134/series 4. Felt to be similar to 09/18/16, favoring atelectasis. Pleural-based left lower lobe nodule measures 14 x 8 mm on image 86/ series 4. Compare 12 x 6 mm on 09/19/2016 (when remeasured). Musculoskeletal: Left proximal humerus fixation. Lower cervical spine fixation. CT ABDOMEN PELVIS FINDINGS Hepatobiliary: Left hepatic lobe cyst. No suspicious liver lesion. Small gallstones out acute cholecystitis or biliary duct dilatation. Pancreas: Normal, without mass or ductal dilatation. Spleen: Normal in size, without focal abnormality. Adrenals/Urinary Tract: Normal adrenal glands. Right renal minimally complex/ septated cyst measures 11.7 x 12.6 cm. Smaller right renal cysts. Too small to characterize lesions in both kidneys. No hydronephrosis. Degraded evaluation of the pelvis, secondary to beam hardening artifact from bilateral hip arthroplasty. Grossly normal appearance of the urinary bladder. Stomach/Bowel: Normal stomach, without wall thickening. Scattered colonic diverticula. Normal small bowel. Vascular/Lymphatic: Advanced aortic and branch vessel atherosclerosis. Infrarenal aortic ectasia at 3.2 cm, similar. No surrounding hemorrhage. Aortocaval node measures 11 mm on image 77/series 2 and is similar to on the prior PET (when remeasured). No pelvic sidewall adenopathy. Reproductive: Grossly normal prostate. Other: No significant free fluid. No evidence of omental or peritoneal disease. Musculoskeletal: Bilateral hip arthroplasty. Degenerative partial fusion of the bilateral sacroiliac joints.  IMPRESSION: 1. Similar to mild increase in thoracic adenopathy. 2. Apparent slight enlargement of a left lower lobe pleural-based nodule which may represent the primary lesion. 3. Similar mild upper abdominal adenopathy. 4.   Coronary artery atherosclerosis. Aortic atherosclerosis. 5. Cholelithiasis. 6. Infrarenal abdominal aortic ectasia. 7. Degraded evaluation of the pelvis, secondary to beam hardening artifact from bilateral hip arthroplasties. Electronically Signed   By: Abigail Miyamoto M.D.   On: 12/09/2016 16:02   Ct Abdomen Pelvis W Contrast  Result Date: 12/09/2016 CLINICAL DATA:  Lung cancer diagnosed 10/17 with ongoing immunotherapy. Chronic fatigue. EXAM: CT CHEST, ABDOMEN, AND PELVIS WITH CONTRAST TECHNIQUE: Multidetector CT imaging of the chest, abdomen and pelvis was performed following the standard protocol during bolus administration of intravenous contrast. CONTRAST:  173m ISOVUE-300 IOPAMIDOL (ISOVUE-300) INJECTION 61% COMPARISON:  PET of 10/05/2016. Chest CT 09/19/2016. No prior dedicated abdominopelvic CTs. FINDINGS: CT CHEST FINDINGS Cardiovascular: Advanced aortic and branch vessel atherosclerosis. Normal heart size, without pericardial effusion. Lad coronary artery atherosclerosis. No central pulmonary embolism, on this non-dedicated study. Mediastinum/Nodes: 10 mm right paratracheal node is unchanged on image 26/series 2. Right infrahilar node measures 9 mm on image 41/series 2 versus similar on the prior PET. Left infrahilar node is enlarged at 1.4 cm on image 35/series 2 versus 9 mm previously. Lungs/Pleura: No pleural fluid. Moderate centrilobular and paraseptal emphysema with lower lobe predominant bronchial wall thickening. Subpleural right upper lobe scarring. Thickening along the right minor fissure on image 86/series 4. Minimal subpleural right greater than left base patchy airspace disease, including on image 134/series 4. Felt to be similar to 09/18/16, favoring atelectasis. Pleural-based left lower lobe nodule measures 14 x 8 mm on image 86/ series 4. Compare 12 x 6 mm on 09/19/2016 (when remeasured). Musculoskeletal: Left proximal humerus fixation. Lower cervical spine fixation. CT ABDOMEN PELVIS  FINDINGS Hepatobiliary: Left hepatic lobe cyst. No suspicious liver lesion. Small gallstones out acute cholecystitis or biliary duct dilatation. Pancreas: Normal, without mass or ductal dilatation. Spleen: Normal in size, without focal abnormality. Adrenals/Urinary Tract: Normal adrenal glands. Right renal minimally complex/ septated cyst measures 11.7 x 12.6 cm. Smaller right renal cysts. Too small to characterize lesions in both kidneys. No hydronephrosis. Degraded evaluation of the pelvis, secondary to beam hardening artifact from bilateral hip arthroplasty. Grossly normal appearance of the urinary bladder. Stomach/Bowel: Normal stomach, without wall thickening. Scattered colonic diverticula. Normal small bowel. Vascular/Lymphatic: Advanced aortic and branch vessel atherosclerosis. Infrarenal aortic ectasia at 3.2 cm, similar. No surrounding hemorrhage. Aortocaval node measures 11 mm on image 77/series 2 and is similar to on the prior PET (when remeasured). No pelvic sidewall adenopathy. Reproductive: Grossly normal prostate. Other: No significant free fluid. No evidence of omental or peritoneal disease. Musculoskeletal: Bilateral hip arthroplasty. Degenerative partial fusion of the bilateral sacroiliac joints. IMPRESSION: 1. Similar to mild increase in thoracic adenopathy. 2. Apparent slight enlargement of a left lower lobe pleural-based nodule which may represent the primary lesion. 3. Similar mild upper abdominal adenopathy. 4.  Coronary artery atherosclerosis. Aortic atherosclerosis. 5. Cholelithiasis. 6. Infrarenal abdominal aortic ectasia. 7. Degraded evaluation of the pelvis, secondary to beam hardening artifact from bilateral hip arthroplasties. Electronically Signed   By: KAbigail MiyamotoM.D.   On: 12/09/2016 16:02    ASSESSMENT AND PLAN:  This is a very pleasant 71years old white male with a stage IV non-small cell lung cancer, adenocarcinoma with positive PDL 1 expression of 80%. The patient is  currently on treatment with immunotherapy with [name] (pembrolizumab) status post  4 cycles. He tolerated the last cycle of his treatment well except for itching. I recommended for him to continue treatment with oral Benadryl and if no improvement, I would consider the patient for treatment with Atarax. He would come back for follow-up visit in 3 weeks for evaluation before starting cycle #6. The patient was advised to call immediately if he has any concerning symptoms in the interval. He was advised to call immediately if he has any concerning symptoms in the interval. The patient voices understanding of current disease status and treatment options and is in agreement with the current care plan.  All questions were answered. The patient knows to call the clinic with any problems, questions or concerns. We can certainly see the patient much sooner if necessary. I spent 10 minutes counseling the patient face to face. The total time spent in the appointment was 15 minutes.  Disclaimer: This note was dictated with voice recognition software. Similar sounding words can inadvertently be transcribed and may not be corrected upon review.

## 2017-01-18 ENCOUNTER — Telehealth: Payer: Self-pay | Admitting: Internal Medicine

## 2017-01-18 NOTE — Telephone Encounter (Signed)
Message sent to chemo scheduler to be added per 01/06/17 los. Appointments scheduled per 01/06/17 los.

## 2017-01-19 ENCOUNTER — Telehealth: Payer: Self-pay | Admitting: *Deleted

## 2017-01-19 NOTE — Telephone Encounter (Signed)
Per 2/7 LOS I have scheduled appts and notified the scheduler

## 2017-01-26 DIAGNOSIS — Z86711 Personal history of pulmonary embolism: Secondary | ICD-10-CM | POA: Diagnosis not present

## 2017-01-26 DIAGNOSIS — Z0000 Encounter for general adult medical examination without abnormal findings: Secondary | ICD-10-CM | POA: Diagnosis not present

## 2017-01-26 DIAGNOSIS — C3490 Malignant neoplasm of unspecified part of unspecified bronchus or lung: Secondary | ICD-10-CM | POA: Diagnosis not present

## 2017-01-26 DIAGNOSIS — E7800 Pure hypercholesterolemia, unspecified: Secondary | ICD-10-CM | POA: Diagnosis not present

## 2017-01-27 ENCOUNTER — Telehealth: Payer: Self-pay | Admitting: Internal Medicine

## 2017-01-27 ENCOUNTER — Ambulatory Visit (HOSPITAL_BASED_OUTPATIENT_CLINIC_OR_DEPARTMENT_OTHER): Payer: Medicare Other

## 2017-01-27 ENCOUNTER — Encounter: Payer: Self-pay | Admitting: Internal Medicine

## 2017-01-27 ENCOUNTER — Telehealth: Payer: Self-pay | Admitting: Medical Oncology

## 2017-01-27 ENCOUNTER — Other Ambulatory Visit (HOSPITAL_BASED_OUTPATIENT_CLINIC_OR_DEPARTMENT_OTHER): Payer: Medicare Other

## 2017-01-27 ENCOUNTER — Ambulatory Visit (HOSPITAL_BASED_OUTPATIENT_CLINIC_OR_DEPARTMENT_OTHER): Payer: Medicare Other | Admitting: Internal Medicine

## 2017-01-27 VITALS — BP 140/63 | HR 63 | Temp 98.8°F | Resp 17 | Ht 71.0 in | Wt 210.1 lb

## 2017-01-27 DIAGNOSIS — Z5112 Encounter for antineoplastic immunotherapy: Secondary | ICD-10-CM

## 2017-01-27 DIAGNOSIS — R5382 Chronic fatigue, unspecified: Secondary | ICD-10-CM

## 2017-01-27 DIAGNOSIS — C3492 Malignant neoplasm of unspecified part of left bronchus or lung: Secondary | ICD-10-CM

## 2017-01-27 DIAGNOSIS — C3432 Malignant neoplasm of lower lobe, left bronchus or lung: Secondary | ICD-10-CM

## 2017-01-27 DIAGNOSIS — Z79899 Other long term (current) drug therapy: Secondary | ICD-10-CM

## 2017-01-27 LAB — CBC WITH DIFFERENTIAL/PLATELET
BASO%: 0.4 % (ref 0.0–2.0)
Basophils Absolute: 0 10*3/uL (ref 0.0–0.1)
EOS%: 5.6 % (ref 0.0–7.0)
Eosinophils Absolute: 0.4 10*3/uL (ref 0.0–0.5)
HEMATOCRIT: 48.9 % (ref 38.4–49.9)
HEMOGLOBIN: 16.7 g/dL (ref 13.0–17.1)
LYMPH#: 1.5 10*3/uL (ref 0.9–3.3)
LYMPH%: 22.5 % (ref 14.0–49.0)
MCH: 32.1 pg (ref 27.2–33.4)
MCHC: 34.2 g/dL (ref 32.0–36.0)
MCV: 94 fL (ref 79.3–98.0)
MONO#: 0.7 10*3/uL (ref 0.1–0.9)
MONO%: 9.7 % (ref 0.0–14.0)
NEUT#: 4.2 10*3/uL (ref 1.5–6.5)
NEUT%: 61.8 % (ref 39.0–75.0)
Platelets: 171 10*3/uL (ref 140–400)
RBC: 5.2 10*6/uL (ref 4.20–5.82)
RDW: 13.7 % (ref 11.0–14.6)
WBC: 6.8 10*3/uL (ref 4.0–10.3)

## 2017-01-27 LAB — COMPREHENSIVE METABOLIC PANEL
ALK PHOS: 70 U/L (ref 40–150)
ALT: 24 U/L (ref 0–55)
AST: 21 U/L (ref 5–34)
Albumin: 3.6 g/dL (ref 3.5–5.0)
Anion Gap: 9 mEq/L (ref 3–11)
BUN: 14.9 mg/dL (ref 7.0–26.0)
CALCIUM: 9 mg/dL (ref 8.4–10.4)
CO2: 19 mEq/L — ABNORMAL LOW (ref 22–29)
CREATININE: 1 mg/dL (ref 0.7–1.3)
Chloride: 111 mEq/L — ABNORMAL HIGH (ref 98–109)
EGFR: 73 mL/min/{1.73_m2} — ABNORMAL LOW (ref >90)
Glucose: 135 mg/dl (ref 70–140)
Potassium: 4.1 mEq/L (ref 3.5–5.1)
Sodium: 139 mEq/L (ref 136–145)
Total Bilirubin: 0.49 mg/dL (ref 0.20–1.20)
Total Protein: 7.2 g/dL (ref 6.4–8.3)

## 2017-01-27 LAB — TSH: TSH: 2.175 m[IU]/L (ref 0.320–4.118)

## 2017-01-27 MED ORDER — HEPARIN SOD (PORK) LOCK FLUSH 100 UNIT/ML IV SOLN
500.0000 [IU] | Freq: Once | INTRAVENOUS | Status: DC | PRN
  Filled 2017-01-27: qty 5

## 2017-01-27 MED ORDER — SODIUM CHLORIDE 0.9% FLUSH
10.0000 mL | INTRAVENOUS | Status: DC | PRN
  Filled 2017-01-27: qty 10

## 2017-01-27 MED ORDER — SODIUM CHLORIDE 0.9 % IV SOLN
200.0000 mg | Freq: Once | INTRAVENOUS | Status: AC
  Administered 2017-01-27: 200 mg via INTRAVENOUS
  Filled 2017-01-27: qty 8

## 2017-01-27 MED ORDER — LIDOCAINE-PRILOCAINE 2.5-2.5 % EX CREA: 1.0000 "application " | TOPICAL_CREAM | CUTANEOUS | 0 refills | Status: DC | PRN

## 2017-01-27 MED ORDER — SODIUM CHLORIDE 0.9 % IV SOLN
Freq: Once | INTRAVENOUS | Status: AC
  Administered 2017-01-27: 09:00:00 via INTRAVENOUS

## 2017-01-27 NOTE — Patient Instructions (Signed)
[facility] Discharge Instructions for Patients Receiving Chemotherapy  Today you received the following chemotherapy agents: Keytruda   To help prevent nausea and vomiting after your treatment, we encourage you to take your nausea medication as directed    If you develop nausea and vomiting that is not controlled by your nausea medication, call the clinic.   BELOW ARE SYMPTOMS THAT SHOULD BE REPORTED IMMEDIATELY:  *FEVER GREATER THAN 100.5 F  *CHILLS WITH OR WITHOUT FEVER  NAUSEA AND VOMITING THAT IS NOT CONTROLLED WITH YOUR NAUSEA MEDICATION  *UNUSUAL SHORTNESS OF BREATH  *UNUSUAL BRUISING OR BLEEDING  TENDERNESS IN MOUTH AND THROAT WITH OR WITHOUT PRESENCE OF ULCERS  *URINARY PROBLEMS  *BOWEL PROBLEMS  UNUSUAL RASH Items with * indicate a potential emergency and should be followed up as soon as possible.  Feel free to call the clinic you have any questions or concerns. The clinic phone number is [phone].  Please show the CHEMO ALERT CARD at check-in to the Emergency Department and triage nurse.   

## 2017-01-27 NOTE — Telephone Encounter (Signed)
Called Nicholas Hale and pt notified of appt. and instructions to hold xarelto x 24 hours prior to procedure and NPO after 0800. Requested flush appt for all lab and MD visits

## 2017-01-27 NOTE — Progress Notes (Signed)
[facility] Telephone:([phone]   Fax:(336) [phone]  OFFICE PROGRESS NOTE  [name], MD [place] [address]  DIAGNOSIS: Stage IV (T1a, N3, M1b) non-small cell lung cancer, adenocarcinoma with PDL 1 expression of 80%. 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  PRIOR THERAPY: None.  CURRENT THERAPY: Immunotherapy with [name] (pembrolizumab) 200 mg IV every 3 weeks status post 5 cycles.  INTERVAL HISTORY: Nicholas Hale 71 y.o. male came to the clinic today for follow-up visit. The patient is currently undergoing treatment with immunotherapy with [name] (pembrolizumab) status post 5 cycles and tolerating it well. He has some swelling of the lower extremities of unclear etiology. He denied having any skin rash or diarrhea. He has no chest pain, shortness of breath, cough or hemoptysis. He denied having any nausea, vomiting or constipation. He is requesting the cath placement for IV access. The patient is here today for evaluation before starting cycle #6 of his treatment.  MEDICAL HISTORY: Past Medical History:  Diagnosis Date  . Abdominal aortic aneurysm ([place])   . Arthritis    "hips; lower spine" (05/24/2014)  . Chronic fatigue 10/05/2016  . Hyperlipidemia   . Hypertension   . Lung cancer ([place]) 09/25/2016  . PE (pulmonary embolism) 9/15  . Pericardial effusion 09/20/2016   Malignant effusion s/p pericardial drain  . Personal history of DVT (deep vein thrombosis) 9/15    ALLERGIES:  has No Known Allergies.  MEDICATIONS:  Current Outpatient Prescriptions  Medication Sig Dispense Refill  . atorvastatin (LIPITOR) 20 MG tablet Take 1 tablet (20 mg total) by mouth daily at 6 PM. 90 tablet 3  . prochlorperazine (COMPAZINE) 10 MG tablet Take 1 tablet  (10 mg total) by mouth every 6 (six) hours as needed for nausea or vomiting. 30 tablet 0  . rivaroxaban (XARELTO) 20 MG TABS tablet Take 1 tablet (20 mg total) by mouth daily with supper. 30 tablet 5   No current facility-administered medications for this visit.     SURGICAL HISTORY:  Past Surgical History:  Procedure Laterality Date  . ANTERIOR FUSION CERVICAL SPINE  ~ 2005  . APPENDECTOMY  1978  . CARDIAC CATHETERIZATION N/A 09/21/2016   Procedure: Pericardiocentesis;  Surgeon: Jettie Booze, MD;  Location: [facility];  Service: Cardiovascular;  Laterality: N/A;  . COLONOSCOPY    . HEMIARTHROPLASTY SHOULDER FRACTURE Left 12/2008  . INGUINAL HERNIA REPAIR Bilateral 1992  . JOINT REPLACEMENT    . PERICARDIAL FLUID DRAINAGE  09/21/2016  . TOTAL HIP ARTHROPLASTY Left 05/23/2014   Procedure: TOTAL HIP ARTHROPLASTY;  Surgeon: Kerin Salen, MD;  Location: [facility];  Service: Orthopedics;  Laterality: Left;  . TOTAL HIP ARTHROPLASTY Right 05/15/2015   Procedure: TOTAL HIP ARTHROPLASTY;  Surgeon: Frederik Pear, MD;  Location: [facility];  Service: Orthopedics;  Laterality: Right;    REVIEW OF SYSTEMS:  A comprehensive review of systems was negative.   PHYSICAL EXAMINATION: General appearance: alert, cooperative and no distress Head: Normocephalic, without obvious abnormality, atraumatic Neck: no adenopathy, no JVD, supple, symmetrical, trachea midline and thyroid not enlarged, symmetric, no tenderness/mass/nodules Lymph nodes: Cervical, supraclavicular, and axillary nodes normal. Resp: clear to auscultation bilaterally Back: symmetric, no curvature. ROM normal. No CVA tenderness. Cardio: regular rate and rhythm, S1, S2 normal, no murmur, click, rub or gallop GI: soft,  non-tender; bowel sounds normal; no masses,  no organomegaly Extremities: extremities normal, atraumatic, no cyanosis or edema  ECOG PERFORMANCE STATUS: 0 - Asymptomatic  Blood pressure 140/63, pulse 63, temperature  98.8 F (37.1 C), temperature source Oral, resp. rate 17, height '5\' 11"'$  (1.803 m), weight 210 lb 1.6 oz (95.3 kg), SpO2 98 %.  LABORATORY DATA: Lab Results  Component Value Date   WBC 6.8 01/27/2017   HGB 16.7 01/27/2017   HCT 48.9 01/27/2017   MCV 94.0 01/27/2017   PLT 171 01/27/2017      Chemistry      Component Value Date/Time   NA 138 01/06/2017 1121   K 4.5 01/06/2017 1121   CL 107 09/23/2016 0557   CO2 24 01/06/2017 1121   BUN 15.0 01/06/2017 1121   CREATININE 1.0 01/06/2017 1121      Component Value Date/Time   CALCIUM 9.5 01/06/2017 1121   ALKPHOS 75 01/06/2017 1121   AST 26 01/06/2017 1121   ALT 30 01/06/2017 1121   BILITOT 0.53 01/06/2017 1121       RADIOGRAPHIC STUDIES: No results found.  ASSESSMENT AND PLAN:  This is a very pleasant 71 years old white male with a stage IV non-small cell lung cancer, adenocarcinoma with PDL 1 expression of 80%. The patient is currently undergoing treatment with immunotherapy with [name] (pembrolizumab) status post 5 cycles. He tolerated the last cycle of his treatment well with no significant adverse effects. I recommended for the patient to proceed with cycle #6 today as scheduled. He would come back for follow-up visit in 3 weeks for evaluation after repeating CT scan of the chest, abdomen and pelvis for restaging of his disease. For the IV access, I will arrange for the patient to have a Port-A-Cath placed by interventional radiology. I will also call his pharmacy with prescription for Emla cream to be applied to the Port-A-Cath site before treatment. He was advised to call immediately if he has any concerning symptoms in the interval. The patient voices understanding of current disease status and treatment options and is in agreement with the current care plan.  All questions were answered. The patient knows to call the clinic with any problems, questions or concerns. We can certainly see the patient much sooner if  necessary. I spent 10 minutes counseling the patient face to face. The total time spent in the appointment was 15 minutes.   Disclaimer: This note was dictated with voice recognition software. Similar sounding words can inadvertently be transcribed and may not be corrected upon review.

## 2017-01-27 NOTE — Telephone Encounter (Signed)
sw pt wife to confirm added flush appts per LOS

## 2017-01-27 NOTE — Progress Notes (Signed)
Pt educated on instructions for port placement 02/01/17 ( nothing to eat or drink after 8am 02/01/17, hold Xarelto Sunday 01/31/17, and arrive at 1230 for 230pm appointment.) pt verbalizes understanding.

## 2017-01-27 NOTE — Telephone Encounter (Signed)
Appointments scheduled throughout May. A copy of the AVS report and appointment schedule was given to the patient per 01/27/17 los. Spoke with Tiffant @ [facility] regarding a port placement appointment, due to patient taking Xarelto, she will schedule and give patient a call, once clearance is received from his provider.

## 2017-01-27 NOTE — Addendum Note (Signed)
Addended by: Ardeen Garland on: 01/27/2017 10:10 AM   Modules accepted: Orders

## 2017-01-27 NOTE — Telephone Encounter (Signed)
Can pt hold xarelto for 24 hours prior to procedure? Per Dr [name] ok for pt to hold xarelto for 24 hours prior to port procedure. appt at 230 -arrive at 1230. Requested flush appt for all lab /MD appts

## 2017-01-28 ENCOUNTER — Encounter: Payer: Self-pay | Admitting: Internal Medicine

## 2017-01-28 NOTE — Progress Notes (Signed)
Received PA request for Lidocaine-Prilocaine. Submitted PA through Cover My Meds.   Your PA has been faxed to the plan as a paper copy. Please contact the plan directly if you haven't received a determination in a typical timeframe.  You will be notified of the determination via fax.

## 2017-01-29 ENCOUNTER — Other Ambulatory Visit: Payer: Self-pay | Admitting: Radiology

## 2017-02-01 ENCOUNTER — Encounter: Payer: Self-pay | Admitting: Internal Medicine

## 2017-02-01 ENCOUNTER — Other Ambulatory Visit: Payer: Self-pay | Admitting: Internal Medicine

## 2017-02-01 ENCOUNTER — Ambulatory Visit (HOSPITAL_COMMUNITY)
Admission: RE | Admit: 2017-02-01 | Discharge: 2017-02-01 | Disposition: A | Discharge status: Home / Self Care | Payer: Medicare Other | Source: Ambulatory Visit | Attending: Internal Medicine | Admitting: Internal Medicine

## 2017-02-01 ENCOUNTER — Telehealth: Payer: Self-pay | Admitting: *Deleted

## 2017-02-01 ENCOUNTER — Encounter (HOSPITAL_COMMUNITY): Payer: Self-pay

## 2017-02-01 ENCOUNTER — Other Ambulatory Visit: Payer: Self-pay | Admitting: Medical Oncology

## 2017-02-01 DIAGNOSIS — I10 Essential (primary) hypertension: Secondary | ICD-10-CM | POA: Diagnosis not present

## 2017-02-01 DIAGNOSIS — Z5112 Encounter for antineoplastic immunotherapy: Secondary | ICD-10-CM

## 2017-02-01 DIAGNOSIS — C3492 Malignant neoplasm of unspecified part of left bronchus or lung: Secondary | ICD-10-CM | POA: Insufficient documentation

## 2017-02-01 DIAGNOSIS — R5382 Chronic fatigue, unspecified: Secondary | ICD-10-CM | POA: Diagnosis not present

## 2017-02-01 DIAGNOSIS — Z86711 Personal history of pulmonary embolism: Secondary | ICD-10-CM | POA: Insufficient documentation

## 2017-02-01 DIAGNOSIS — Z7901 Long term (current) use of anticoagulants: Secondary | ICD-10-CM | POA: Diagnosis not present

## 2017-02-01 DIAGNOSIS — I714 Abdominal aortic aneurysm, without rupture: Secondary | ICD-10-CM | POA: Diagnosis not present

## 2017-02-01 DIAGNOSIS — Z452 Encounter for adjustment and management of vascular access device: Secondary | ICD-10-CM | POA: Diagnosis not present

## 2017-02-01 DIAGNOSIS — Z86718 Personal history of other venous thrombosis and embolism: Secondary | ICD-10-CM | POA: Insufficient documentation

## 2017-02-01 DIAGNOSIS — E785 Hyperlipidemia, unspecified: Secondary | ICD-10-CM | POA: Insufficient documentation

## 2017-02-01 HISTORY — PX: IR GENERIC HISTORICAL: IMG1180011

## 2017-02-01 LAB — APTT: APTT: 38 s — AB (ref 24–36)

## 2017-02-01 LAB — CBC
HCT: 51.7 % (ref 39.0–52.0)
HEMOGLOBIN: 17.7 g/dL — AB (ref 13.0–17.0)
MCH: 32 pg (ref 26.0–34.0)
MCHC: 34.2 g/dL (ref 30.0–36.0)
MCV: 93.5 fL (ref 78.0–100.0)
PLATELETS: 193 10*3/uL (ref 150–400)
RBC: 5.53 MIL/uL (ref 4.22–5.81)
RDW: 13.8 % (ref 11.5–15.5)
WBC: 8.8 10*3/uL (ref 4.0–10.5)

## 2017-02-01 LAB — PROTIME-INR
INR: 1.04
PROTHROMBIN TIME: 13.6 s (ref 11.4–15.2)

## 2017-02-01 MED ORDER — MIDAZOLAM HCL 2 MG/2ML IJ SOLN
INTRAMUSCULAR | Status: AC
  Filled 2017-02-01: qty 6

## 2017-02-01 MED ORDER — FENTANYL CITRATE (PF) 100 MCG/2ML IJ SOLN
INTRAMUSCULAR | Status: AC | PRN
  Administered 2017-02-01: 50 ug via INTRAVENOUS

## 2017-02-01 MED ORDER — LIDOCAINE-EPINEPHRINE (PF) 2 %-1:200000 IJ SOLN
INTRAMUSCULAR | Status: AC | PRN
  Administered 2017-02-01: 10 mL via INTRADERMAL

## 2017-02-01 MED ORDER — FENTANYL CITRATE (PF) 100 MCG/2ML IJ SOLN
INTRAMUSCULAR | Status: AC
  Filled 2017-02-01: qty 6

## 2017-02-01 MED ORDER — CEFAZOLIN SODIUM-DEXTROSE 2-4 GM/100ML-% IV SOLN
2.0000 g | Freq: Once | INTRAVENOUS | Status: AC
  Administered 2017-02-01: 2 g via INTRAVENOUS
  Filled 2017-02-01: qty 100

## 2017-02-01 MED ORDER — LIDOCAINE-EPINEPHRINE (PF) 2 %-1:200000 IJ SOLN
INTRAMUSCULAR | Status: AC
  Filled 2017-02-01: qty 20

## 2017-02-01 MED ORDER — MIDAZOLAM HCL 2 MG/2ML IJ SOLN
INTRAMUSCULAR | Status: AC | PRN
  Administered 2017-02-01: 1 mg via INTRAVENOUS

## 2017-02-01 MED ORDER — HEPARIN SOD (PORK) LOCK FLUSH 100 UNIT/ML IV SOLN
INTRAVENOUS | Status: AC
  Filled 2017-02-01: qty 5

## 2017-02-01 MED ORDER — SODIUM CHLORIDE 0.9 % IV SOLN
INTRAVENOUS | Status: DC
  Administered 2017-02-01: 13:00:00 via INTRAVENOUS

## 2017-02-01 NOTE — Discharge Instructions (Signed)
Implanted Port Home Guide °An implanted port is a type of central line that is placed under the skin. Central lines are used to provide IV access when treatment or nutrition needs to be given through a person’s veins. Implanted ports are used for long-term IV access. An implanted port may be placed because: °· You need IV medicine that would be irritating to the small veins in your hands or arms. °· You need long-term IV medicines, such as antibiotics. °· You need IV nutrition for a long period. °· You need frequent blood draws for lab tests. °· You need dialysis. °Implanted ports are usually placed in the chest area, but they can also be placed in the upper arm, the abdomen, or the leg. An implanted port has two main parts: °· Reservoir. The reservoir is round and will appear as a small, raised area under your skin. The reservoir is the part where a needle is inserted to give medicines or draw blood. °· Catheter. The catheter is a thin, flexible tube that extends from the reservoir. The catheter is placed into a large vein. Medicine that is inserted into the reservoir goes into the catheter and then into the vein. °How will I care for my incision site? °Do not get the incision site wet. Bathe or shower as directed by your health care provider. °How is my port accessed? °Special steps must be taken to access the port: °· Before the port is accessed, a numbing cream can be placed on the skin. This helps numb the skin over the port site. °· Your health care provider uses a sterile technique to access the port. °¨ Your health care provider must put on a mask and sterile gloves. °¨ The skin over your port is cleaned carefully with an antiseptic and allowed to dry. °¨ The port is gently pinched between sterile gloves, and a needle is inserted into the port. °· Only "non-coring" port needles should be used to access the port. Once the port is accessed, a blood return should be checked. This helps ensure that the port is  in the vein and is not clogged. °· If your port needs to remain accessed for a constant infusion, a clear (transparent) bandage will be placed over the needle site. The bandage and needle will need to be changed every week, or as directed by your health care provider. °· Keep the bandage covering the needle clean and dry. Do not get it wet. Follow your health care provider’s instructions on how to take a shower or bath while the port is accessed. °· If your port does not need to stay accessed, no bandage is needed over the port. °What is flushing? °Flushing helps keep the port from getting clogged. Follow your health care provider’s instructions on how and when to flush the port. Ports are usually flushed with saline solution or a medicine called heparin. The need for flushing will depend on how the port is used. °· If the port is used for intermittent medicines or blood draws, the port will need to be flushed: °¨ After medicines have been given. °¨ After blood has been drawn. °¨ As part of routine maintenance. °· If a constant infusion is running, the port may not need to be flushed. °How long will my port stay implanted? °The port can stay in for as long as your health care provider thinks it is needed. When it is time for the port to come out, surgery will be done to remove it.   The procedure is similar to the one performed when the port was put in. °When should I seek immediate medical care? °When you have an implanted port, you should seek immediate medical care if: °· You notice a bad smell coming from the incision site. °· You have swelling, redness, or drainage at the incision site. °· You have more swelling or pain at the port site or the surrounding area. °· You have a fever that is not controlled with medicine. °This information is not intended to replace advice given to you by your health care provider. Make sure you discuss any questions you have with your health care provider. °Document Released:  11/16/2005 Document Revised: 04/23/2016 Document Reviewed: 07/24/2013 °Elsevier Interactive Patient Education © 2017 Else[employer] Inc[employer] °Moderate Conscious Sedation, Adult, Care After °These instructions provide you with information about caring for yourself after your procedure. Your health care provider may also give you more specific instructions. Your treatment has been planned according to current medical practices, but problems sometimes occur. Call your health care provider if you have any problems or questions after your procedure. °What can I expect after the procedure? °After your procedure, it is common: °· To feel sleepy for several hours. °· To feel clumsy and have poor balance for several hours. °· To have poor judgment for several hours. °· To vomit if you eat too soon. °Follow these instructions at home: °For at least 24 hours after the procedure:  ° °· Do not: °¨ Participate in activities where you could fall or become injured. °¨ Drive. °¨ Use heavy machinery. °¨ Drink alcohol. °¨ Take sleeping pills or medicines that cause drowsiness. °¨ Make important decisions or sign legal documents. °¨ Take care of children on your own. °· Rest. °Eating and drinking  °· Follow the diet recommended by your health care provider. °· If you vomit: °¨ Drink water, juice, or soup when you can drink without vomiting. °¨ Make sure you have little or no nausea before eating solid foods. °General instructions  °· Have a responsible adult stay with you until you are awake and alert. °· Take over-the-counter and prescription medicines only as told by your health care provider. °· If you smoke, do not smoke without supervision. °· Keep all follow-up visits as told by your health care provider. This is important. °Contact a health care provider if: °· You keep feeling nauseous or you keep vomiting. °· You feel light-headed. °· You develop a rash. °· You have a fever. °Get help right away if: °· You have trouble breathing. °This  information is not intended to replace advice given to you by your health care provider. Make sure you discuss any questions you have with your health care provider. °Document Released: 09/06/2013 Document Revised: 04/20/2016 Document Reviewed: 03/07/2016 °Elsevier Interactive Patient Education © 2017 Elsevier Inc. ° °

## 2017-02-01 NOTE — Progress Notes (Signed)
Gave coverage determination form to [place] and advised of the denial. Have not received denial letter with reason as of yet.

## 2017-02-01 NOTE — H&P (Signed)
Referring Physician(s): Mohamed,Mohamed  Supervising Physician: Arne Cleveland  Patient Status:  [facility]  Chief Complaint: "I'm here for a port a cath"   Subjective: Pt familiar to IR service from prior PE thrombolysis in 2015. 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 He presents today for port a cath placement for additional treatment. He currently denies fever, headache or chest pain, dyspnea, cough, abdominal/back pain, nausea, vomiting or abnormal bleeding.  Past Medical History:  Diagnosis Date  . Abdominal aortic aneurysm ([place])   . Arthritis    "hips; lower spine" (05/24/2014)  . Chronic fatigue 10/05/2016  . Hyperlipidemia   . Hypertension   . Lung cancer ([place]) 09/25/2016  . PE (pulmonary embolism) 9/15  . Pericardial effusion 09/20/2016   Malignant effusion s/p pericardial drain  . Personal history of DVT (deep vein thrombosis) 9/15   Past Surgical History:  Procedure Laterality Date  . ANTERIOR FUSION CERVICAL SPINE  ~ 2005  . APPENDECTOMY  1978  . CARDIAC CATHETERIZATION N/A 09/21/2016   Procedure: Pericardiocentesis;  Surgeon: Jettie Booze, MD;  Location: [facility];  Service: Cardiovascular;  Laterality: N/A;  . COLONOSCOPY    . HEMIARTHROPLASTY SHOULDER FRACTURE Left 12/2008  . INGUINAL HERNIA REPAIR Bilateral 1992  . JOINT REPLACEMENT    . PERICARDIAL FLUID DRAINAGE  09/21/2016  . TOTAL HIP ARTHROPLASTY Left 05/23/2014   Procedure: TOTAL HIP ARTHROPLASTY;  Surgeon: Kerin Salen, MD;  Location: [facility];  Service: Orthopedics;  Laterality: Left;  . TOTAL HIP ARTHROPLASTY Right 05/15/2015   Procedure: TOTAL HIP ARTHROPLASTY;  Surgeon: Frederik Pear, MD;  Location: [facility];  Service: Orthopedics;  Laterality: Right;    Allergies: Patient has no known allergies.  Medications: Prior to Admission medications   Medication Sig Start Date End Date Taking? Authorizing Provider    amoxicillin (AMOXIL) 250 MG capsule Take 250 mg by mouth 3 (three) times daily.   Yes Historical Provider, MD  atorvastatin (LIPITOR) 20 MG tablet Take 1 tablet (20 mg total) by mouth daily at 6 PM. 10/02/16  Yes Nelva Bush, MD  lidocaine-prilocaine (EMLA) cream Apply 1 application topically as needed. 01/27/17   [name], MD  prochlorperazine (COMPAZINE) 10 MG tablet Take 1 tablet (10 mg total) by mouth every 6 (six) hours as needed for nausea or vomiting. Patient not taking: Reported on 01/27/2017 10/05/16   [name], MD  rivaroxaban (XARELTO) 20 MG TABS tablet Take 1 tablet (20 mg total) by mouth daily with supper. 09/25/16   Tammy S Parrett, NP     Vital Signs: BP (!) 147/66 (BP Location: Left Arm)   Pulse 64   Temp 98.4 F (36.9 C) (Oral)   Resp 16   SpO2 100%   Physical Exam Awake, alert. Chest clear to auscultation bilaterally. Heart with regular rate and rhythm. Abdomen soft, positive bowel sounds, nontender. Lower extremities with 1+ edema bilaterally.  Imaging: No results found.  Labs:  CBC:  Recent Labs  11/25/16 1323 12/16/16 1012 01/06/17 1121 01/27/17 0821  WBC 8.8 7.0 7.1 6.8  HGB 16.3 16.8 17.3* 16.7  HCT 49.0 49.3 53.4* 48.9  PLT 185 182 166 171    COAGS:  Recent Labs  09/19/16 2331  INR 1.27    BMP:  Recent Labs  09/20/16 0533 09/21/16 0246 09/22/16 0829 09/23/16 0557  11/25/16 1323 12/16/16 1012 01/06/17 1121 01/27/17 0821  NA 138 137 139 140  < >  140 141 138 139  K 4.1 4.0 4.0 3.8  < > 4.1 4.0 4.5 4.1  CL 109 107 107 107  --   --   --   --   --   CO2 21* 20* 24 25  < > '24 22 24 '$ 19*  GLUCOSE 118* 125* 85 93  < > 88 112 94 135  BUN '14 18 14 7  '$ < > 17.9 17.6 15.0 14.9  CALCIUM 8.3* 8.5* 8.1* 8.3*  < > 9.0 9.2 9.5 9.0  CREATININE 1.08 1.03 1.02 0.92  < > 1.2 1.1 1.0 1.0  GFRNONAA >60 >60 >60 >60  --   --   --   --   --   GFRAA >60 >60 >60 >60  --   --   --   --   --   < > = values in this interval not  displayed.  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 He presents today for port a cath placement for additional treatment. Risks and benefits discussed with the patient/spouse including, but not limited to bleeding, infection, pneumothorax, or fibrin sheath development and need for additional procedures.All of the patient's questions were answered, patient is agreeable to proceed.Consent signed and in chart.      Electronically Signed: D. Rowe Robert 02/01/2017, 12:39 PM   I spent a total of 25 minutes at the the patient's bedside AND on the patient's hospital floor or unit, greater than 50% of which was counseling/coordinating care for port a cath placement

## 2017-02-01 NOTE — Telephone Encounter (Signed)
Patient called stating that his EMLA cream is not authorized. Can you please check on this?

## 2017-02-01 NOTE — Procedures (Signed)
R IJ Port cathter placement with US and fluoroscopy No complication No blood loss. See complete dictation in [facility] PACS.  

## 2017-02-01 NOTE — Progress Notes (Signed)
Received copied staff call message from [facility] to check on auth for EMLA creme.  Called [phone]([facility]) and spoke with [facility]. She states PA was denied and began reading the reason but could not understand due to not clear [place]. Requested she send this information via fax to [phone] to my attention. I will then provide this information to the RN. She states I should receive within the day. Call ELTRVUYEB#[phone].

## 2017-02-01 NOTE — Progress Notes (Signed)
Received coverage forms from [facility] via fax twice but no denial letter. Called back to the same numner and spoke with Roselyn Reef whom states she would send the denial letter [phone].

## 2017-02-04 ENCOUNTER — Encounter: Payer: Self-pay | Admitting: Internal Medicine

## 2017-02-04 NOTE — Progress Notes (Unsigned)
Placed denial letter in RN's chair on 02/02/17 for Lidocaine cream.

## 2017-02-11 ENCOUNTER — Telehealth: Payer: Self-pay

## 2017-02-11 NOTE — Telephone Encounter (Signed)
Pt called with c/o itching. He has had about 6 cycles of keytruda. This started with the [place] and pt has mentioned to Dr [name]. Dr [name] has mentioned benadryl to pt. Pt read label and it said if having lung problems not to take. The itching is worsening and he has woken up the last 3 night scratching. He is starting to break the skin. This RN instructed him to take the benadryl PO and will ask Dr [name] if he wants to add anything else (maybe pepcid?).

## 2017-02-12 ENCOUNTER — Telehealth: Payer: Self-pay | Admitting: Medical Oncology

## 2017-02-12 ENCOUNTER — Encounter: Payer: Self-pay | Admitting: Internal Medicine

## 2017-02-12 NOTE — Telephone Encounter (Signed)
emla cream denied-I called [employer] to answer questions for approval and requested a faxed form to complete. Form received and completed and signed by [facility] and faxed back to [employer].

## 2017-02-12 NOTE — Progress Notes (Signed)
Received staff message regarding emla cream. Gave Diane RN denial letter with number to appeal.

## 2017-02-12 NOTE — Telephone Encounter (Signed)
-----   Message from [name], MD sent at 02/12/2017  8:48 AM EDT ----- Benadryl and Pepcid are ok. ----- Message ----- From: Ardeen Garland, RN Sent: 02/11/2017   2:50 PM To: [name], MD  "Pt called with c/o itching. He has had about 6 cycles of keytruda. This started with the [place] and pt has mentioned to Dr [name]. Dr [name] has mentioned benadryl to pt. Pt read label and it said if having lung problems not to take. The itching is worsening and he has woken up the last 3 night scratching. He is starting to break the skin. This RN instructed him to take the benadryl PO and will ask Dr [name] if he wants to add anything else (maybe pepcid?)."   let me know if I need to do anything else.

## 2017-02-12 NOTE — Telephone Encounter (Signed)
Pt started benadryl 50 mg every 6 hours and "it seems to have helped".

## 2017-02-15 ENCOUNTER — Ambulatory Visit (HOSPITAL_COMMUNITY)
Admission: RE | Admit: 2017-02-15 | Discharge: 2017-02-15 | Disposition: A | Discharge status: Home / Self Care | Payer: Medicare Other | Source: Ambulatory Visit | Attending: Internal Medicine | Admitting: Internal Medicine

## 2017-02-15 ENCOUNTER — Other Ambulatory Visit: Payer: Self-pay | Admitting: *Deleted

## 2017-02-15 ENCOUNTER — Telehealth: Payer: Self-pay | Admitting: Medical Oncology

## 2017-02-15 ENCOUNTER — Ambulatory Visit (HOSPITAL_BASED_OUTPATIENT_CLINIC_OR_DEPARTMENT_OTHER): Payer: Medicare Other

## 2017-02-15 ENCOUNTER — Encounter (HOSPITAL_COMMUNITY): Payer: Self-pay

## 2017-02-15 DIAGNOSIS — I700 Atherosclerosis of aorta: Secondary | ICD-10-CM | POA: Diagnosis not present

## 2017-02-15 DIAGNOSIS — I313 Pericardial effusion (noninflammatory): Principal | ICD-10-CM

## 2017-02-15 DIAGNOSIS — C3492 Malignant neoplasm of unspecified part of left bronchus or lung: Secondary | ICD-10-CM | POA: Diagnosis not present

## 2017-02-15 DIAGNOSIS — K7689 Other specified diseases of liver: Secondary | ICD-10-CM | POA: Insufficient documentation

## 2017-02-15 DIAGNOSIS — J439 Emphysema, unspecified: Secondary | ICD-10-CM | POA: Diagnosis not present

## 2017-02-15 DIAGNOSIS — R5382 Chronic fatigue, unspecified: Secondary | ICD-10-CM | POA: Diagnosis not present

## 2017-02-15 DIAGNOSIS — N289 Disorder of kidney and ureter, unspecified: Secondary | ICD-10-CM | POA: Diagnosis not present

## 2017-02-15 DIAGNOSIS — C801 Malignant (primary) neoplasm, unspecified: Secondary | ICD-10-CM

## 2017-02-15 DIAGNOSIS — C3432 Malignant neoplasm of lower lobe, left bronchus or lung: Secondary | ICD-10-CM

## 2017-02-15 DIAGNOSIS — Z5112 Encounter for antineoplastic immunotherapy: Secondary | ICD-10-CM

## 2017-02-15 DIAGNOSIS — I3131 Malignant pericardial effusion in diseases classified elsewhere: Secondary | ICD-10-CM

## 2017-02-15 DIAGNOSIS — I714 Abdominal aortic aneurysm, without rupture: Secondary | ICD-10-CM | POA: Diagnosis not present

## 2017-02-15 DIAGNOSIS — Z452 Encounter for adjustment and management of vascular access device: Secondary | ICD-10-CM | POA: Diagnosis present

## 2017-02-15 DIAGNOSIS — R911 Solitary pulmonary nodule: Secondary | ICD-10-CM | POA: Diagnosis not present

## 2017-02-15 DIAGNOSIS — C3490 Malignant neoplasm of unspecified part of unspecified bronchus or lung: Secondary | ICD-10-CM | POA: Diagnosis not present

## 2017-02-15 MED ORDER — SODIUM CHLORIDE 0.9 % IJ SOLN
10.0000 mL | Freq: Once | INTRAMUSCULAR | Status: AC
  Administered 2017-02-15: 10 mL
  Filled 2017-02-15: qty 10

## 2017-02-15 MED ORDER — HEPARIN SOD (PORK) LOCK FLUSH 100 UNIT/ML IV SOLN
500.0000 [IU] | Freq: Once | INTRAVENOUS | Status: AC
  Administered 2017-02-15: 500 [IU] via INTRAVENOUS

## 2017-02-15 MED ORDER — IOPAMIDOL (ISOVUE-300) INJECTION 61%
INTRAVENOUS | Status: AC
  Filled 2017-02-15: qty 100

## 2017-02-15 MED ORDER — IOPAMIDOL (ISOVUE-300) INJECTION 61%
100.0000 mL | Freq: Once | INTRAVENOUS | Status: AC | PRN
  Administered 2017-02-15: 100 mL via INTRAVENOUS

## 2017-02-15 MED ORDER — HEPARIN SOD (PORK) LOCK FLUSH 100 UNIT/ML IV SOLN
INTRAVENOUS | Status: AC
  Filled 2017-02-15: qty 5

## 2017-02-15 NOTE — Telephone Encounter (Signed)
Emla cream approved -wife notified.

## 2017-02-17 ENCOUNTER — Ambulatory Visit (HOSPITAL_BASED_OUTPATIENT_CLINIC_OR_DEPARTMENT_OTHER): Payer: Medicare Other | Admitting: Internal Medicine

## 2017-02-17 ENCOUNTER — Ambulatory Visit: Payer: Medicare Other

## 2017-02-17 ENCOUNTER — Encounter: Payer: Self-pay | Admitting: Internal Medicine

## 2017-02-17 ENCOUNTER — Other Ambulatory Visit (HOSPITAL_BASED_OUTPATIENT_CLINIC_OR_DEPARTMENT_OTHER): Payer: Medicare Other

## 2017-02-17 ENCOUNTER — Telehealth: Payer: Self-pay | Admitting: Internal Medicine

## 2017-02-17 VITALS — BP 125/57 | HR 58 | Temp 98.4°F | Resp 17 | Ht 71.0 in | Wt 210.6 lb

## 2017-02-17 DIAGNOSIS — Z86718 Personal history of other venous thrombosis and embolism: Secondary | ICD-10-CM | POA: Diagnosis not present

## 2017-02-17 DIAGNOSIS — Z5112 Encounter for antineoplastic immunotherapy: Secondary | ICD-10-CM

## 2017-02-17 DIAGNOSIS — Z7901 Long term (current) use of anticoagulants: Secondary | ICD-10-CM

## 2017-02-17 DIAGNOSIS — Z7189 Other specified counseling: Secondary | ICD-10-CM

## 2017-02-17 DIAGNOSIS — Z452 Encounter for adjustment and management of vascular access device: Secondary | ICD-10-CM

## 2017-02-17 DIAGNOSIS — C801 Malignant (primary) neoplasm, unspecified: Secondary | ICD-10-CM

## 2017-02-17 DIAGNOSIS — C3432 Malignant neoplasm of lower lobe, left bronchus or lung: Secondary | ICD-10-CM

## 2017-02-17 DIAGNOSIS — Z79899 Other long term (current) drug therapy: Secondary | ICD-10-CM

## 2017-02-17 DIAGNOSIS — R5382 Chronic fatigue, unspecified: Secondary | ICD-10-CM

## 2017-02-17 DIAGNOSIS — C3492 Malignant neoplasm of unspecified part of left bronchus or lung: Secondary | ICD-10-CM

## 2017-02-17 DIAGNOSIS — Z5111 Encounter for antineoplastic chemotherapy: Secondary | ICD-10-CM

## 2017-02-17 DIAGNOSIS — I3131 Malignant pericardial effusion in diseases classified elsewhere: Secondary | ICD-10-CM

## 2017-02-17 DIAGNOSIS — I313 Pericardial effusion (noninflammatory): Principal | ICD-10-CM

## 2017-02-17 DIAGNOSIS — R21 Rash and other nonspecific skin eruption: Secondary | ICD-10-CM | POA: Diagnosis not present

## 2017-02-17 DIAGNOSIS — Z95828 Presence of other vascular implants and grafts: Secondary | ICD-10-CM

## 2017-02-17 HISTORY — DX: Encounter for antineoplastic chemotherapy: Z51.11

## 2017-02-17 LAB — CBC WITH DIFFERENTIAL/PLATELET
BASO%: 1 % (ref 0.0–2.0)
BASOS ABS: 0.1 10*3/uL (ref 0.0–0.1)
EOS ABS: 0.5 10*3/uL (ref 0.0–0.5)
EOS%: 6.4 % (ref 0.0–7.0)
HEMATOCRIT: 50.4 % — AB (ref 38.4–49.9)
HGB: 17.2 g/dL — ABNORMAL HIGH (ref 13.0–17.1)
LYMPH#: 1.5 10*3/uL (ref 0.9–3.3)
LYMPH%: 21.1 % (ref 14.0–49.0)
MCH: 32 pg (ref 27.2–33.4)
MCHC: 34.1 g/dL (ref 32.0–36.0)
MCV: 93.9 fL (ref 79.3–98.0)
MONO#: 0.8 10*3/uL (ref 0.1–0.9)
MONO%: 10.5 % (ref 0.0–14.0)
NEUT#: 4.4 10*3/uL (ref 1.5–6.5)
NEUT%: 61 % (ref 39.0–75.0)
Platelets: 159 10*3/uL (ref 140–400)
RBC: 5.37 10*6/uL (ref 4.20–5.82)
RDW: 14.2 % (ref 11.0–14.6)
WBC: 7.2 10*3/uL (ref 4.0–10.3)

## 2017-02-17 LAB — COMPREHENSIVE METABOLIC PANEL
ALBUMIN: 3.7 g/dL (ref 3.5–5.0)
ALK PHOS: 75 U/L (ref 40–150)
ALT: 28 U/L (ref 0–55)
AST: 23 U/L (ref 5–34)
Anion Gap: 8 mEq/L (ref 3–11)
BUN: 14.6 mg/dL (ref 7.0–26.0)
CALCIUM: 9.2 mg/dL (ref 8.4–10.4)
CHLORIDE: 108 meq/L (ref 98–109)
CO2: 24 mEq/L (ref 22–29)
Creatinine: 1 mg/dL (ref 0.7–1.3)
EGFR: 73 mL/min/{1.73_m2} — AB (ref >90)
Glucose: 91 mg/dl (ref 70–140)
POTASSIUM: 4.2 meq/L (ref 3.5–5.1)
Sodium: 140 mEq/L (ref 136–145)
Total Bilirubin: 0.66 mg/dL (ref 0.20–1.20)
Total Protein: 7.3 g/dL (ref 6.4–8.3)

## 2017-02-17 LAB — TSH: TSH: 3.368 m(IU)/L (ref 0.320–4.118)

## 2017-02-17 MED ORDER — DEXAMETHASONE 4 MG PO TABS: ORAL_TABLET | ORAL | 1 refills | Status: DC

## 2017-02-17 MED ORDER — CYANOCOBALAMIN 1000 MCG/ML IJ SOLN
1000.0000 ug | Freq: Once | INTRAMUSCULAR | Status: AC
  Administered 2017-02-17: 1000 ug via INTRAMUSCULAR

## 2017-02-17 MED ORDER — FOLIC ACID 1 MG PO TABS: 1.0000 mg | ORAL_TABLET | Freq: Every day | ORAL | 4 refills | Status: DC

## 2017-02-17 MED ORDER — HEPARIN SOD (PORK) LOCK FLUSH 100 UNIT/ML IV SOLN
500.0000 [IU] | Freq: Once | INTRAVENOUS | Status: AC
  Administered 2017-02-17: 500 [IU] via INTRAVENOUS
  Filled 2017-02-17: qty 5

## 2017-02-17 NOTE — Telephone Encounter (Signed)
Scheduled appts per 3/21 los. Gave patient AVS and calender per 02/17/2017 los

## 2017-02-17 NOTE — Progress Notes (Signed)
DISCONTINUE ON PATHWAY REGIMEN - Non-Small Cell Lung     A cycle is 21 days:     Pembrolizumab        Dose Mod: None  **Always confirm dose/schedule in your pharmacy ordering system**    REASON: Disease Progression PRIOR TREATMENT: [id number]: Pembrolizumab 200 mg q21 Days Until Disease Progression, Unacceptable Toxicity, or up to 24 Months TREATMENT RESPONSE: Progressive Disease (PD)  START ON PATHWAY REGIMEN - Non-Small Cell Lung     A cycle is every 21 days:     Carboplatin      Pemetrexed      Bevacizumab   **Always confirm dose/schedule in your pharmacy ordering system**    Patient Characteristics: Stage IV Metastatic, Non Squamous, Second Line - Chemotherapy/Immunotherapy, PS = 0, 1, Prior PD-1/PD-L1 Inhibitor AJCC T Category: T1a Current Disease Status: Distant Metastases AJCC N Category: N3 AJCC M Category: M1b AJCC 8 Stage Grouping: IVA Histology: Non Squamous Cell ROS1 Rearrangement Status: Awaiting Test Results T790M Mutation Status: Not Applicable - EGFR Mutation Negative/Unknown Other Mutations/Biomarkers: No Other Actionable Mutations PD-L1 Expression Status: PD-L1 Positive >= 50% (TPS) Chemotherapy/Immunotherapy LOT: Second Line Chemotherapy/Immunotherapy Molecular Targeted Therapy: Not Appropriate ALK Translocation Status: Awaiting Test Results Would you be surprised if this patient died  in the next year? I would NOT be surprised if this patient died in the next year EGFR Mutation Status: Awaiting Test Results BRAF V600E Mutation Status: Awaiting Test Results Performance Status: PS = 0, 1 Immunotherapy Candidate Status: Not a Candidate for Immunotherapy Prior Immunotherapy Status: Prior PD-1/PD-L1 Inhibitor  Intent of Therapy: Non-Curative / Palliative Intent, Discussed with Patient

## 2017-02-17 NOTE — Progress Notes (Signed)
[facility] [phone]   [phone]  OFFICE PROGRESS NOTE  [name], MD [place] [address]  DIAGNOSIS: Stage IV (T1a, N3, M1b) non-small cell lung cancer, adenocarcinoma with PDL 1 expression of 80%. 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  PRIOR THERAPY: Treatment with immunotherapy with [name] (pembrolizumab) 200 MG IV every 3 weeks status post 6 cycles discontinued secondary to disease progression..  CURRENT THERAPY: Systemic chemotherapy with carboplatin for AUC of 5, Alimta 500 MG/M2 and Avastin 15 MG/KG every 3 weeks. First dose 02/24/2017.  INTERVAL HISTORY: Nicholas Hale 71 y.o. male returns to the clinic today for follow-up visit accompanied by his wife. The patient is feeling fine today with no specific complaints except for itching as well as few areas of skin rash on the back of his shoulder. He is applying hydrocortisone cream and also taking Benadryl with some improvement. He denied having any chest pain, shortness of breath, cough or hemoptysis. He has no fever or chills. He denied having any significant weight loss or night sweats. He has no headache or visual changes. He denied having any bleeding issues. He tolerated his treatment with [name] (pembrolizumab) fairly well except for the itching and mild skin rash. He had repeat CT scan of the chest, abdomen and pelvis performed recently and he is here for evaluation and discussion of his scan results and recommendation regarding treatment of his condition.  MEDICAL HISTORY: Past Medical History:  Diagnosis Date  . Abdominal aortic aneurysm ([place])   . Arthritis    "hips; lower spine" (05/24/2014)  . Chronic fatigue 10/05/2016  . Hyperlipidemia   . Hypertension   . Lung  cancer ([place]) 09/25/2016  . PE (pulmonary embolism) 9/15  . Pericardial effusion 09/20/2016   Malignant effusion s/p pericardial drain  . Personal history of DVT (deep vein thrombosis) 9/15    ALLERGIES:  has No Known Allergies.  MEDICATIONS:  Current Outpatient Prescriptions  Medication Sig Dispense Refill  . diphenhydrAMINE (BENADRYL) 25 mg capsule Take 25 mg by mouth every 6 (six) hours as needed.    Marland Kitchen atorvastatin (LIPITOR) 20 MG tablet Take 1 tablet (20 mg total) by mouth daily at 6 PM. 90 tablet 3  . lidocaine-prilocaine (EMLA) cream Apply 1 application topically as needed. 30 g 0  . prochlorperazine (COMPAZINE) 10 MG tablet Take 1 tablet (10 mg total) by mouth every 6 (six) hours as needed for nausea or vomiting. (Patient not taking: Reported on 01/27/2017) 30 tablet 0  . rivaroxaban (XARELTO) 20 MG TABS tablet Take 1 tablet (20 mg total) by mouth daily with supper. 30 tablet 5   No current facility-administered medications for this visit.     SURGICAL HISTORY:  Past Surgical History:  Procedure Laterality Date  . ANTERIOR FUSION CERVICAL SPINE  ~ 2005  . APPENDECTOMY  1978  . CARDIAC CATHETERIZATION N/A 09/21/2016   Procedure: Pericardiocentesis;  Surgeon: Jettie Booze, MD;  Location: [facility];  Service: Cardiovascular;  Laterality: N/A;  . COLONOSCOPY    . HEMIARTHROPLASTY SHOULDER FRACTURE Left 12/2008  . INGUINAL HERNIA REPAIR Bilateral 1992  . IR GENERIC HISTORICAL  02/01/2017   IR FLUORO GUIDE PORT INSERTION RIGHT 02/01/2017 Arne Cleveland, MD [facility]  . IR GENERIC HISTORICAL  02/01/2017   IR US GUIDE VASC ACCESS  RIGHT 02/01/2017 Arne Cleveland, MD [facility]  . JOINT REPLACEMENT    . PERICARDIAL FLUID DRAINAGE  09/21/2016  . TOTAL HIP ARTHROPLASTY Left 05/23/2014   Procedure: TOTAL HIP ARTHROPLASTY;  Surgeon: Kerin Salen, MD;  Location: [facility];  Service: Orthopedics;  Laterality: Left;  . TOTAL HIP ARTHROPLASTY Right 05/15/2015   Procedure: TOTAL  HIP ARTHROPLASTY;  Surgeon: Frederik Pear, MD;  Location: [facility];  Service: Orthopedics;  Laterality: Right;    REVIEW OF SYSTEMS:  Constitutional: negative Eyes: negative Ears, nose, mouth, throat, and face: negative Respiratory: negative Cardiovascular: negative Gastrointestinal: negative Genitourinary:negative Integument/breast: positive for pruritus and rash Hematologic/lymphatic: negative Musculoskeletal:negative Neurological: negative Behavioral/Psych: negative Endocrine: negative Allergic/Immunologic: negative   PHYSICAL EXAMINATION: General appearance: alert, cooperative and no distress Head: Normocephalic, without obvious abnormality, atraumatic Neck: no adenopathy, no JVD, supple, symmetrical, trachea midline and thyroid not enlarged, symmetric, no tenderness/mass/nodules Lymph nodes: Cervical, supraclavicular, and axillary nodes normal. Resp: clear to auscultation bilaterally Back: symmetric, no curvature. ROM normal. No CVA tenderness. Cardio: regular rate and rhythm, S1, S2 normal, no murmur, click, rub or gallop GI: soft, non-tender; bowel sounds normal; no masses,  no organomegaly Extremities: extremities normal, atraumatic, no cyanosis or edema Neurologic: Alert and oriented X 3, normal strength and tone. Normal symmetric reflexes. Normal coordination and gait  ECOG PERFORMANCE STATUS: 0 - Asymptomatic  Blood pressure (!) 125/57, pulse (!) 58, temperature 98.4 F (36.9 C), temperature source Oral, resp. rate 17, height '5\' 11"'$  (1.803 m), weight 210 lb 9.6 oz (95.5 kg), SpO2 100 %.  LABORATORY DATA: Lab Results  Component Value Date   WBC 7.2 02/17/2017   HGB 17.2 (H) 02/17/2017   HCT 50.4 (H) 02/17/2017   MCV 93.9 02/17/2017   PLT 159 02/17/2017      Chemistry      Component Value Date/Time   NA 140 02/17/2017 0949   K 4.2 02/17/2017 0949   CL 107 09/23/2016 0557   CO2 24 02/17/2017 0949   BUN 14.6 02/17/2017 0949   CREATININE 1.0 02/17/2017 0949        Component Value Date/Time   CALCIUM 9.2 02/17/2017 0949   ALKPHOS 75 02/17/2017 0949   AST 23 02/17/2017 0949   ALT 28 02/17/2017 0949   BILITOT 0.66 02/17/2017 0949       RADIOGRAPHIC STUDIES: Ct Chest W Contrast  Result Date: 02/15/2017 CLINICAL DATA:  Restaging lung cancer. EXAM: CT CHEST, ABDOMEN, AND PELVIS WITH CONTRAST TECHNIQUE: Multidetector CT imaging of the chest, abdomen and pelvis was performed following the standard protocol during bolus administration of intravenous contrast. CONTRAST:  162m ISOVUE-300 IOPAMIDOL (ISOVUE-300) INJECTION 61% COMPARISON:  12/09/2016 FINDINGS: CT CHEST FINDINGS Chest wall: The right-sided Port-A-Cath is stable. No chest wall mass, supraclavicular or axillary lymphadenopathy. Small scattered lymph nodes are stable. The thyroid gland is grossly normal. Small left thyroid lobe. Cardiovascular: The heart is normal in size. No pericardial effusion. The aorta is normal in caliber. Stable atherosclerotic changes. No dissection. Scattered coronary artery calcifications. Mediastinum/Nodes: Stable small scattered mediastinal lymph nodes. Right infrahilar lymph node on image number 40 measures 9.5 mm and is stable. Left hilar mass/ adenopathy has enlarged since the prior study. It measures 31 x 24 mm on image number 34 and previously measured 23 x 14 mm. Is also a new 9 mm lymph node on image number 35 anterior to the descending thoracic aorta. The esophagus is grossly normal. Lungs/Pleura: Slight interval enlarged subpleural density on image number 78 in the left lower  lobe. It measures 21 x 7.5 mm and previously measured 14 x 8 mm. There is also an enlarging pulmonary nodule on image number 77 near the pleural density. On the prior examination this measured 3 mm. Branching density on image number 76 could be endobronchial tumor. Stable underlying advanced emphysematous changes and pulmonary scarring. Bibasilar scarring and traction bronchiectasis and peribronchial  thickening. No pleural effusions. Musculoskeletal: No significant bony findings. No findings to suggest osseous metastatic disease. Stable degenerative changes involving the thoracic spine. CT ABDOMEN PELVIS FINDINGS Hepatobiliary: Stable left hepatic lobe cyst. No findings suspicious for hepatic metastatic disease. The gallbladder is normal. No common bile duct dilatation. Pancreas: Normal and stable. Spleen: Normal in stable. Adrenals/Urinary Tract: The adrenal glands are unremarkable and stable. Stable complex large right renal cystic lesion consistent with a Bosniak 2 lesion. No worrisome nodularity or enhancement. Other small cysts are noted bilaterally. No worrisome renal lesions or hydronephrosis. No ureteral or bladder calculi. No bladder mass. Stomach/Bowel: The stomach, duodenum, small bowel and colon are grossly normal. No inflammatory changes, mass lesions or obstructive findings. The terminal ileum and appendix are normal. Moderate sigmoid diverticulosis without findings for acute diverticulitis. The terminal ileum is normal. Vascular/Lymphatic: Stable advanced atherosclerotic calcifications involving the aorta and branch vessels. Stable 3.6 x 3.3 cm infrarenal abdominal aortic aneurysm. Stable retroperitoneal lymph nodes. Reproductive: The prostate gland and seminal vesicles are grossly normal. Other: Significant artifact through the pelvis due to bilateral hip prosthesis. No obvious pelvic mass or adenopathy. No inguinal mass or adenopathy. Musculoskeletal: No findings suspicious for osseous metastatic disease. Stable SI joint degenerative changes and partially fused SI joints. IMPRESSION: 1. Progressive disease involving the left hemithorax with an enlarging subpleural density, enlarging adjacent pulmonary nodule and progressive left hilar lymphadenopathy. 2. Advanced emphysematous changes but no findings for other pulmonary nodules or acute overlying pulmonary process. 3. No findings for  abdominal/pelvic metastatic disease. Stable small scattered retrocrural and retroperitoneal lymph nodes. 4. Stable renal and hepatic cysts. 5. Stable advanced atherosclerotic calcifications involving the aorta and branch vessels. 3.6 x 3.3 cm infrarenal abdominal aortic aneurysm again demonstrated. Electronically Signed   By: Marijo Sanes M.D.   On: 02/15/2017 13:13   Ct Abdomen Pelvis W Contrast  Result Date: 02/15/2017 CLINICAL DATA:  Restaging lung cancer. EXAM: CT CHEST, ABDOMEN, AND PELVIS WITH CONTRAST TECHNIQUE: Multidetector CT imaging of the chest, abdomen and pelvis was performed following the standard protocol during bolus administration of intravenous contrast. CONTRAST:  1107m ISOVUE-300 IOPAMIDOL (ISOVUE-300) INJECTION 61% COMPARISON:  12/09/2016 FINDINGS: CT CHEST FINDINGS Chest wall: The right-sided Port-A-Cath is stable. No chest wall mass, supraclavicular or axillary lymphadenopathy. Small scattered lymph nodes are stable. The thyroid gland is grossly normal. Small left thyroid lobe. Cardiovascular: The heart is normal in size. No pericardial effusion. The aorta is normal in caliber. Stable atherosclerotic changes. No dissection. Scattered coronary artery calcifications. Mediastinum/Nodes: Stable small scattered mediastinal lymph nodes. Right infrahilar lymph node on image number 40 measures 9.5 mm and is stable. Left hilar mass/ adenopathy has enlarged since the prior study. It measures 31 x 24 mm on image number 34 and previously measured 23 x 14 mm. Is also a new 9 mm lymph node on image number 35 anterior to the descending thoracic aorta. The esophagus is grossly normal. Lungs/Pleura: Slight interval enlarged subpleural density on image number 78 in the left lower lobe. It measures 21 x 7.5 mm and previously measured 14 x 8 mm. There is also an enlarging pulmonary nodule on  image number 77 near the pleural density. On the prior examination this measured 3 mm. Branching density on image  number 76 could be endobronchial tumor. Stable underlying advanced emphysematous changes and pulmonary scarring. Bibasilar scarring and traction bronchiectasis and peribronchial thickening. No pleural effusions. Musculoskeletal: No significant bony findings. No findings to suggest osseous metastatic disease. Stable degenerative changes involving the thoracic spine. CT ABDOMEN PELVIS FINDINGS Hepatobiliary: Stable left hepatic lobe cyst. No findings suspicious for hepatic metastatic disease. The gallbladder is normal. No common bile duct dilatation. Pancreas: Normal and stable. Spleen: Normal in stable. Adrenals/Urinary Tract: The adrenal glands are unremarkable and stable. Stable complex large right renal cystic lesion consistent with a Bosniak 2 lesion. No worrisome nodularity or enhancement. Other small cysts are noted bilaterally. No worrisome renal lesions or hydronephrosis. No ureteral or bladder calculi. No bladder mass. Stomach/Bowel: The stomach, duodenum, small bowel and colon are grossly normal. No inflammatory changes, mass lesions or obstructive findings. The terminal ileum and appendix are normal. Moderate sigmoid diverticulosis without findings for acute diverticulitis. The terminal ileum is normal. Vascular/Lymphatic: Stable advanced atherosclerotic calcifications involving the aorta and branch vessels. Stable 3.6 x 3.3 cm infrarenal abdominal aortic aneurysm. Stable retroperitoneal lymph nodes. Reproductive: The prostate gland and seminal vesicles are grossly normal. Other: Significant artifact through the pelvis due to bilateral hip prosthesis. No obvious pelvic mass or adenopathy. No inguinal mass or adenopathy. Musculoskeletal: No findings suspicious for osseous metastatic disease. Stable SI joint degenerative changes and partially fused SI joints. IMPRESSION: 1. Progressive disease involving the left hemithorax with an enlarging subpleural density, enlarging adjacent pulmonary nodule and  progressive left hilar lymphadenopathy. 2. Advanced emphysematous changes but no findings for other pulmonary nodules or acute overlying pulmonary process. 3. No findings for abdominal/pelvic metastatic disease. Stable small scattered retrocrural and retroperitoneal lymph nodes. 4. Stable renal and hepatic cysts. 5. Stable advanced atherosclerotic calcifications involving the aorta and branch vessels. 3.6 x 3.3 cm infrarenal abdominal aortic aneurysm again demonstrated. Electronically Signed   By: Marijo Sanes M.D.   On: 02/15/2017 13:13   Ir US Guide Vasc Access Right  Result Date: 02/01/2017 CLINICAL DATA:  Stage IV left lung adenocarcinoma. Needs durable venous access for treatment regimen. EXAM: TUNNELED PORT CATHETER PLACEMENT WITH ULTRASOUND AND FLUOROSCOPIC GUIDANCE ANESTHESIA/SEDATION: Intravenous Fentanyl and Versed were administered as conscious sedation during continuous monitoring of the patient's level of consciousness and physiological / cardiorespiratory status by the radiology RN, with a total moderate sedation time of 13 minutes. TECHNIQUE: The procedure, risks, benefits, and alternatives were explained to the patient. Questions regarding the procedure were encouraged and answered. The patient understands and consents to the procedure. As antibiotic prophylaxis, cefazolin 2 g was ordered pre-procedure and administered intravenously within one hour of incision. Patency of the right IJ vein was confirmed with ultrasound with image documentation. An appropriate skin site was determined. Skin site was marked. Region was prepped using maximum barrier technique including cap and mask, sterile gown, sterile gloves, large sterile sheet, and Chlorhexidine as cutaneous antisepsis. The region was infiltrated locally with 1% lidocaine. Under real-time ultrasound guidance, the right IJ vein was accessed with a 21 gauge micropuncture needle; the needle tip within the vein was confirmed with ultrasound image  documentation. Needle was exchanged over a 018 guidewire for transitional dilator which allowed passage of the [facility] wire into the IVC. Over this, the transitional dilator was exchanged for a 5 [place] MPA catheter. A small incision was made on the right anterior chest wall and  a subcutaneous pocket fashioned. The power-injectable port was positioned and its catheter tunneled to the right IJ dermatotomy site. The MPA catheter was exchanged over an Amplatz wire for a peel-away sheath, through which the port catheter, which had been trimmed to the appropriate length, was advanced and positioned under fluoroscopy with its tip at the cavoatrial junction. Spot chest radiograph confirms good catheter position and no pneumothorax. The pocket was closed with deep interrupted and subcuticular continuous 3-0 Monocryl sutures. The port was flushed per protocol. The incisions were covered with Dermabond then covered with a sterile dressing. FLUOROSCOPY TIME:  0.2 minute (37 uGym2 DAP) COMPLICATIONS: COMPLICATIONS None immediate IMPRESSION: Technically successful right IJ power-injectable port catheter placement. Ready for routine use. Electronically Signed   By: Lucrezia Europe M.D.   On: 02/01/2017 16:00   Ir Fluoro Guide Port Insertion Right  Result Date: 02/01/2017 CLINICAL DATA:  Stage IV left lung adenocarcinoma. Needs durable venous access for treatment regimen. EXAM: TUNNELED PORT CATHETER PLACEMENT WITH ULTRASOUND AND FLUOROSCOPIC GUIDANCE ANESTHESIA/SEDATION: Intravenous Fentanyl and Versed were administered as conscious sedation during continuous monitoring of the patient's level of consciousness and physiological / cardiorespiratory status by the radiology RN, with a total moderate sedation time of 13 minutes. TECHNIQUE: The procedure, risks, benefits, and alternatives were explained to the patient. Questions regarding the procedure were encouraged and answered. The patient understands and consents to the procedure. As  antibiotic prophylaxis, cefazolin 2 g was ordered pre-procedure and administered intravenously within one hour of incision. Patency of the right IJ vein was confirmed with ultrasound with image documentation. An appropriate skin site was determined. Skin site was marked. Region was prepped using maximum barrier technique including cap and mask, sterile gown, sterile gloves, large sterile sheet, and Chlorhexidine as cutaneous antisepsis. The region was infiltrated locally with 1% lidocaine. Under real-time ultrasound guidance, the right IJ vein was accessed with a 21 gauge micropuncture needle; the needle tip within the vein was confirmed with ultrasound image documentation. Needle was exchanged over a 018 guidewire for transitional dilator which allowed passage of the [facility] wire into the IVC. Over this, the transitional dilator was exchanged for a 5 [place] MPA catheter. A small incision was made on the right anterior chest wall and a subcutaneous pocket fashioned. The power-injectable port was positioned and its catheter tunneled to the right IJ dermatotomy site. The MPA catheter was exchanged over an Amplatz wire for a peel-away sheath, through which the port catheter, which had been trimmed to the appropriate length, was advanced and positioned under fluoroscopy with its tip at the cavoatrial junction. Spot chest radiograph confirms good catheter position and no pneumothorax. The pocket was closed with deep interrupted and subcuticular continuous 3-0 Monocryl sutures. The port was flushed per protocol. The incisions were covered with Dermabond then covered with a sterile dressing. FLUOROSCOPY TIME:  0.2 minute (37 uGym2 DAP) COMPLICATIONS: COMPLICATIONS None immediate IMPRESSION: Technically successful right IJ power-injectable port catheter placement. Ready for routine use. Electronically Signed   By: Lucrezia Europe M.D.   On: 02/01/2017 16:00    ASSESSMENT AND PLAN:  This is a very pleasant 71 years old white  male with a stage IV non-small cell lung cancer, adenocarcinoma with PDL 1 expression of 80%. The patient was started on treatment with immunotherapy with [name] (pembrolizumab) status post 6 cycles. He had repeat CT scan of the chest, abdomen and pelvis performed recently. I personally and independently reviewed the scan images and discuss the results with the patient and  his wife and showed them the images. Unfortunately his scan showed evidence for further disease progression compared to the last imaging studies after cycle #3. This is definitely consistent with disease progression on [name] (pembrolizumab). I recommended for the patient to discontinue this treatment. I had a lengthy discussion with him about his treatment options and goals of care. I discussed with the patient the option of palliative care versus consideration of systemic chemotherapy with carboplatin for AUC of 5, Alimta 500 MG/M2 and Avastin 15 MG/KG every 3 weeks. The patient is interested in proceeding with the chemotherapy. I discussed with the patient adverse effects of this treatment including but not limited to alopecia, myelosuppression, nausea and vomiting, peripheral neuropathy, liver or renal dysfunction. We will arrange for the patient to receive vitamin B 12 injection today. The patient would also receive prescription for Decadron 4 mg by mouth twice a day, the day before, day of and day after the chemotherapy in addition to folic acid 1 mg by mouth daily. He is expected to start the first cycle of this treatment next week. He would come back for follow-up visit in 4 weeks for evaluation with the start of cycle #2. For the history of deep venous thrombosis, the patient will continue his current treatment with Xarelto. For IV access, he he had a Port-A-Cath placed by interventional radiology. The patient was advised to call immediately if he has any concerning symptoms in the interval. The patient voices  understanding of current disease status and treatment options and is in agreement with the current care plan. All questions were answered. The patient knows to call the clinic with any problems, questions or concerns. We can certainly see the patient much sooner if necessary.  Disclaimer: This note was dictated with voice recognition software. Similar sounding words can inadvertently be transcribed and may not be corrected upon review.

## 2017-02-18 ENCOUNTER — Telehealth: Payer: Self-pay | Admitting: Internal Medicine

## 2017-02-18 NOTE — Telephone Encounter (Signed)
Let patient know that Flush appts where adde on lab appt days  Per Rn Diane.

## 2017-02-19 DIAGNOSIS — H52203 Unspecified astigmatism, bilateral: Secondary | ICD-10-CM | POA: Diagnosis not present

## 2017-02-19 DIAGNOSIS — H2513 Age-related nuclear cataract, bilateral: Secondary | ICD-10-CM | POA: Diagnosis not present

## 2017-02-19 DIAGNOSIS — H5203 Hypermetropia, bilateral: Secondary | ICD-10-CM | POA: Diagnosis not present

## 2017-02-24 ENCOUNTER — Ambulatory Visit (HOSPITAL_BASED_OUTPATIENT_CLINIC_OR_DEPARTMENT_OTHER): Payer: Medicare Other

## 2017-02-24 ENCOUNTER — Ambulatory Visit: Payer: Medicare Other

## 2017-02-24 ENCOUNTER — Other Ambulatory Visit (HOSPITAL_BASED_OUTPATIENT_CLINIC_OR_DEPARTMENT_OTHER): Payer: Medicare Other

## 2017-02-24 VITALS — BP 146/63 | HR 69 | Temp 98.7°F | Resp 18

## 2017-02-24 DIAGNOSIS — C3432 Malignant neoplasm of lower lobe, left bronchus or lung: Secondary | ICD-10-CM

## 2017-02-24 DIAGNOSIS — Z5112 Encounter for antineoplastic immunotherapy: Secondary | ICD-10-CM

## 2017-02-24 DIAGNOSIS — Z5111 Encounter for antineoplastic chemotherapy: Secondary | ICD-10-CM

## 2017-02-24 DIAGNOSIS — C3492 Malignant neoplasm of unspecified part of left bronchus or lung: Secondary | ICD-10-CM

## 2017-02-24 LAB — CBC WITH DIFFERENTIAL/PLATELET
BASO%: 0.1 % (ref 0.0–2.0)
Basophils Absolute: 0 10*3/uL (ref 0.0–0.1)
EOS ABS: 0 10*3/uL (ref 0.0–0.5)
EOS%: 0 % (ref 0.0–7.0)
HCT: 47.7 % (ref 38.4–49.9)
HGB: 16.3 g/dL (ref 13.0–17.1)
LYMPH#: 0.7 10*3/uL — AB (ref 0.9–3.3)
LYMPH%: 6.7 % — ABNORMAL LOW (ref 14.0–49.0)
MCH: 32.1 pg (ref 27.2–33.4)
MCHC: 34.2 g/dL (ref 32.0–36.0)
MCV: 94.1 fL (ref 79.3–98.0)
MONO#: 0.4 10*3/uL (ref 0.1–0.9)
MONO%: 3.4 % (ref 0.0–14.0)
NEUT#: 9.4 10*3/uL — ABNORMAL HIGH (ref 1.5–6.5)
NEUT%: 89.8 % — AB (ref 39.0–75.0)
PLATELETS: 167 10*3/uL (ref 140–400)
RBC: 5.07 10*6/uL (ref 4.20–5.82)
RDW: 13.8 % (ref 11.0–14.6)
WBC: 10.4 10*3/uL — ABNORMAL HIGH (ref 4.0–10.3)

## 2017-02-24 LAB — COMPREHENSIVE METABOLIC PANEL
ALT: 30 U/L (ref 0–55)
AST: 21 U/L (ref 5–34)
Albumin: 3.7 g/dL (ref 3.5–5.0)
Alkaline Phosphatase: 72 U/L (ref 40–150)
Anion Gap: 10 mEq/L (ref 3–11)
BUN: 16.5 mg/dL (ref 7.0–26.0)
CHLORIDE: 108 meq/L (ref 98–109)
CO2: 24 meq/L (ref 22–29)
CREATININE: 1.1 mg/dL (ref 0.7–1.3)
Calcium: 9.1 mg/dL (ref 8.4–10.4)
EGFR: 71 mL/min/{1.73_m2} — ABNORMAL LOW (ref >90)
Glucose: 158 mg/dl — ABNORMAL HIGH (ref 70–140)
Potassium: 3.8 mEq/L (ref 3.5–5.1)
Sodium: 141 mEq/L (ref 136–145)
Total Bilirubin: 0.63 mg/dL (ref 0.20–1.20)
Total Protein: 7.3 g/dL (ref 6.4–8.3)

## 2017-02-24 LAB — UA PROTEIN, DIPSTICK - CHCC: Protein, ur: NEGATIVE mg/dL

## 2017-02-24 MED ORDER — SODIUM CHLORIDE 0.9% FLUSH
10.0000 mL | INTRAVENOUS | Status: DC | PRN
  Administered 2017-02-24: 10 mL
  Filled 2017-02-24: qty 10

## 2017-02-24 MED ORDER — HEPARIN SOD (PORK) LOCK FLUSH 100 UNIT/ML IV SOLN
500.0000 [IU] | Freq: Once | INTRAVENOUS | Status: AC | PRN
  Administered 2017-02-24: 500 [IU]
  Filled 2017-02-24: qty 5

## 2017-02-24 MED ORDER — PALONOSETRON HCL INJECTION 0.25 MG/5ML
0.2500 mg | Freq: Once | INTRAVENOUS | Status: AC
  Administered 2017-02-24: 0.25 mg via INTRAVENOUS

## 2017-02-24 MED ORDER — SODIUM CHLORIDE 0.9 % IV SOLN
500.0000 mg/m2 | Freq: Once | INTRAVENOUS | Status: AC
  Administered 2017-02-24: 1100 mg via INTRAVENOUS
  Filled 2017-02-24: qty 40

## 2017-02-24 MED ORDER — PALONOSETRON HCL INJECTION 0.25 MG/5ML
INTRAVENOUS | Status: AC
  Filled 2017-02-24: qty 5

## 2017-02-24 MED ORDER — SODIUM CHLORIDE 0.9 % IV SOLN
589.0000 mg | Freq: Once | INTRAVENOUS | Status: AC
  Administered 2017-02-24: 590 mg via INTRAVENOUS
  Filled 2017-02-24: qty 59

## 2017-02-24 MED ORDER — SODIUM CHLORIDE 0.9 % IV SOLN
Freq: Once | INTRAVENOUS | Status: AC
  Administered 2017-02-24: 11:00:00 via INTRAVENOUS
  Filled 2017-02-24: qty 5

## 2017-02-24 MED ORDER — SODIUM CHLORIDE 0.9 % IV SOLN
Freq: Once | INTRAVENOUS | Status: AC
  Administered 2017-02-24: 11:00:00 via INTRAVENOUS

## 2017-02-24 MED ORDER — SODIUM CHLORIDE 0.9 % IV SOLN
14.7000 mg/kg | Freq: Once | INTRAVENOUS | Status: AC
  Administered 2017-02-24: 1400 mg via INTRAVENOUS
  Filled 2017-02-24: qty 48

## 2017-02-24 NOTE — Patient Instructions (Signed)
Implanted Port Home Guide An implanted port is a type of central line that is placed under the skin. Central lines are used to provide IV access when treatment or nutrition needs to be given through a person's veins. Implanted ports are used for long-term IV access. An implanted port may be placed because:  You need IV medicine that would be irritating to the small veins in your hands or arms.  You need long-term IV medicines, such as antibiotics.  You need IV nutrition for a long period.  You need frequent blood draws for lab tests.  You need dialysis.  Implanted ports are usually placed in the chest area, but they can also be placed in the upper arm, the abdomen, or the leg. An implanted port has two main parts:  Reservoir. The reservoir is round and will appear as a small, raised area under your skin. The reservoir is the part where a needle is inserted to give medicines or draw blood.  Catheter. The catheter is a thin, flexible tube that extends from the reservoir. The catheter is placed into a large vein. Medicine that is inserted into the reservoir goes into the catheter and then into the vein.  How will I care for my incision site? Do not get the incision site wet. Bathe or shower as directed by your health care provider. How is my port accessed? Special steps must be taken to access the port:  Before the port is accessed, a numbing cream can be placed on the skin. This helps numb the skin over the port site.  Your health care provider uses a sterile technique to access the port. ? Your health care provider must put on a mask and sterile gloves. ? The skin over your port is cleaned carefully with an antiseptic and allowed to dry. ? The port is gently pinched between sterile gloves, and a needle is inserted into the port.  Only "non-coring" port needles should be used to access the port. Once the port is accessed, a blood return should be checked. This helps ensure that the port  is in the vein and is not clogged.  If your port needs to remain accessed for a constant infusion, a clear (transparent) bandage will be placed over the needle site. The bandage and needle will need to be changed every week, or as directed by your health care provider.  Keep the bandage covering the needle clean and dry. Do not get it wet. Follow your health care provider's instructions on how to take a shower or bath while the port is accessed.  If your port does not need to stay accessed, no bandage is needed over the port.  What is flushing? Flushing helps keep the port from getting clogged. Follow your health care provider's instructions on how and when to flush the port. Ports are usually flushed with saline solution or a medicine called heparin. The need for flushing will depend on how the port is used.  If the port is used for intermittent medicines or blood draws, the port will need to be flushed: ? After medicines have been given. ? After blood has been drawn. ? As part of routine maintenance.  If a constant infusion is running, the port may not need to be flushed.  How long will my port stay implanted? The port can stay in for as long as your health care provider thinks it is needed. When it is time for the port to come out, surgery will be   done to remove it. The procedure is similar to the one performed when the port was put in. When should I seek immediate medical care? When you have an implanted port, you should seek immediate medical care if:  You notice a bad smell coming from the incision site.  You have swelling, redness, or drainage at the incision site.  You have more swelling or pain at the port site or the surrounding area.  You have a fever that is not controlled with medicine.  This information is not intended to replace advice given to you by your health care provider. Make sure you discuss any questions you have with your health care provider. Document  Released: 11/16/2005 Document Revised: 04/23/2016 Document Reviewed: 07/24/2013 Elsevier Interactive Patient Education  2017 [employer].  

## 2017-02-24 NOTE — Patient Instructions (Signed)
[facility] Discharge Instructions for Patients Receiving Chemotherapy  Today you received the following chemotherapy agents: avastin, carboplatin, alimta  To help prevent nausea and vomiting after your treatment, we encourage you to take your nausea medication.  Take it as often as prescribed.     If you develop nausea and vomiting that is not controlled by your nausea medication, call the clinic. If it is after clinic hours your family physician or the after hours number for the clinic or go to the Emergency Department.   BELOW ARE SYMPTOMS THAT SHOULD BE REPORTED IMMEDIATELY:  *FEVER GREATER THAN 100.5 F  *CHILLS WITH OR WITHOUT FEVER  NAUSEA AND VOMITING THAT IS NOT CONTROLLED WITH YOUR NAUSEA MEDICATION  *UNUSUAL SHORTNESS OF BREATH  *UNUSUAL BRUISING OR BLEEDING  TENDERNESS IN MOUTH AND THROAT WITH OR WITHOUT PRESENCE OF ULCERS  *URINARY PROBLEMS  *BOWEL PROBLEMS  UNUSUAL RASH Items with * indicate a potential emergency and should be followed up as soon as possible.  One of the nurses will contact you 24 hours after your treatment. Please let the nurse know about any problems that you may have experienced. Feel free to call the clinic you have any questions or concerns. The clinic phone number is [phone].   I have been informed and understand all the instructions given to me. I know to contact the clinic, my physician, or go to the Emergency Department if any problems should occur. I do not have any questions at this time, but understand that I may call the clinic during office hours   should I have any questions or need assistance in obtaining follow up care.    __________________________________________  _____________  __________ Signature of Patient or Authorized Representative            Date                   Time    __________________________________________ Nurse's Signature   Bevacizumab injection (avastin) What is this  medicine? BEVACIZUMAB (be va SIZ yoo mab) is a monoclonal antibody. It is used to treat many types of cancer. This medicine may be used for other purposes; ask your health care provider or pharmacist if you have questions. COMMON BRAND NAME(S): Avastin What should I tell my health care provider before I take this medicine? They need to know if you have any of these conditions: -diabetes -heart disease -high blood pressure -history of coughing up blood -prior anthracycline chemotherapy (e.g., doxorubicin, daunorubicin, epirubicin) -recent or ongoing radiation therapy -recent or planning to have surgery -stroke -an unusual or allergic reaction to bevacizumab, hamster proteins, mouse proteins, other medicines, foods, dyes, or preservatives -pregnant or trying to get pregnant -breast-feeding How should I use this medicine? This medicine is for infusion into a vein. It is given by a health care professional in a hospital or clinic setting. Talk to your pediatrician regarding the use of this medicine in children. Special care may be needed. Overdosage: If you think you have taken too much of this medicine contact a poison control center or emergency room at once. NOTE: This medicine is only for you. Do not share this medicine with others. What if I miss a dose? It is important not to miss your dose. Call your doctor or health care professional if you are unable to keep an appointment. What may interact with this medicine? Interactions are not expected. This list may not describe all possible interactions. Give your health care provider a list  of all the medicines, herbs, non-prescription drugs, or dietary supplements you use. Also tell them if you smoke, drink alcohol, or use illegal drugs. Some items may interact with your medicine. What should I watch for while using this medicine? Your condition will be monitored carefully while you are receiving this medicine. You will need important blood  work and urine testing done while you are taking this medicine. This medicine may increase your risk to bruise or bleed. Call your doctor or health care professional if you notice any unusual bleeding. This medicine should be started at least 28 days following major surgery and the site of the surgery should be totally healed. Check with your doctor before scheduling dental work or surgery while you are receiving this treatment. Talk to your doctor if you have recently had surgery or if you have a wound that has not healed. Do not become pregnant while taking this medicine or for 6 months after stopping it. Women should inform their doctor if they wish to become pregnant or think they might be pregnant. There is a potential for serious side effects to an unborn child. Talk to your health care professional or pharmacist for more information. Do not breast-feed an infant while taking this medicine and for 6 months after the last dose. This medicine has caused ovarian failure in some women. This medicine may interfere with the ability to have a child. You should talk to your doctor or health care professional if you are concerned about your fertility. What side effects may I notice from receiving this medicine? Side effects that you should report to your doctor or health care professional as soon as possible: -allergic reactions like skin rash, itching or hives, swelling of the face, lips, or tongue -chest pain or chest tightness -chills -coughing up blood -high fever -seizures -severe constipation -signs and symptoms of bleeding such as bloody or black, tarry stools; red or dark-brown urine; spitting up blood or brown material that looks like coffee grounds; red spots on the skin; unusual bruising or bleeding from the eye, gums, or nose -signs and symptoms of a blood clot such as breathing problems; chest pain; severe, sudden headache; pain, swelling, warmth in the leg -signs and symptoms of a stroke  like changes in vision; confusion; trouble speaking or understanding; severe headaches; sudden numbness or weakness of the face, arm or leg; trouble walking; dizziness; loss of balance or coordination -stomach pain -sweating -swelling of legs or ankles -vomiting -weight gain Side effects that usually do not require medical attention (report to your doctor or health care professional if they continue or are bothersome): -back pain -changes in taste -decreased appetite -dry skin -nausea -tiredness This list may not describe all possible side effects. Call your doctor for medical advice about side effects. You may report side effects to FDA at 1-800-FDA-1088. Where should I keep my medicine? This drug is given in a hospital or clinic and will not be stored at home. NOTE: This sheet is a summary. It may not cover all possible information. If you have questions about this medicine, talk to your doctor, pharmacist, or health care provider.  2018 Elsevier/Gold Standard (2016-11-13 14:33:29)      Pemetrexed injection (alimta) What is this medicine? PEMETREXED (PEM e TREX ed) is a chemotherapy drug used to treat lung cancers like non-small cell lung cancer and mesothelioma. It may also be used to treat other cancers. This medicine may be used for other purposes; ask your health care provider or  pharmacist if you have questions. COMMON BRAND NAME(S): Alimta What should I tell my health care provider before I take this medicine? They need to know if you have any of these conditions: -infection (especially a virus infection such as chickenpox, cold sores, or herpes) -kidney disease -low blood counts, like low white cell, platelet, or red cell counts -lung or breathing disease, like asthma -radiation therapy -an unusual or allergic reaction to pemetrexed, other medicines, foods, dyes, or preservative -pregnant or trying to get pregnant -breast-feeding How should I use this medicine? This  drug is given as an infusion into a vein. It is administered in a hospital or clinic by a specially trained health care professional. Talk to your pediatrician regarding the use of this medicine in children. Special care may be needed. Overdosage: If you think you have taken too much of this medicine contact a poison control center or emergency room at once. NOTE: This medicine is only for you. Do not share this medicine with others. What if I miss a dose? It is important not to miss your dose. Call your doctor or health care professional if you are unable to keep an appointment. What may interact with this medicine? This medicine may interact with the following medications: -Ibuprofen This list may not describe all possible interactions. Give your health care provider a list of all the medicines, herbs, non-prescription drugs, or dietary supplements you use. Also tell them if you smoke, drink alcohol, or use illegal drugs. Some items may interact with your medicine. What should I watch for while using this medicine? Visit your doctor for checks on your progress. This drug may make you feel generally unwell. This is not uncommon, as chemotherapy can affect healthy cells as well as cancer cells. Report any side effects. Continue your course of treatment even though you feel ill unless your doctor tells you to stop. In some cases, you may be given additional medicines to help with side effects. Follow all directions for their use. Call your doctor or health care professional for advice if you get a fever, chills or sore throat, or other symptoms of a cold or flu. Do not treat yourself. This drug decreases your body's ability to fight infections. Try to avoid being around people who are sick. This medicine may increase your risk to bruise or bleed. Call your doctor or health care professional if you notice any unusual bleeding. Be careful brushing and flossing your teeth or using a toothpick because you  may get an infection or bleed more easily. If you have any dental work done, tell your dentist you are receiving this medicine. Avoid taking products that contain aspirin, acetaminophen, ibuprofen, naproxen, or ketoprofen unless instructed by your doctor. These medicines may hide a fever. Call your doctor or health care professional if you get diarrhea or mouth sores. Do not treat yourself. To protect your kidneys, drink water or other fluids as directed while you are taking this medicine. Do not become pregnant while taking this medicine or for 6 months after stopping it. Women should inform their doctor if they wish to become pregnant or think they might be pregnant. Men should not father a child while taking this medicine and for 3 months after stopping it. This may interfere with the ability to father a child. You should talk to your doctor or health care professional if you are concerned about your fertility. There is a potential for serious side effects to an unborn child. Talk to your health  care professional or pharmacist for more information. Do not breast-feed an infant while taking this medicine or for 1 week after stopping it. What side effects may I notice from receiving this medicine? Side effects that you should report to your doctor or health care professional as soon as possible: -allergic reactions like skin rash, itching or hives, swelling of the face, lips, or tongue -breathing problems -redness, blistering, peeling or loosening of the skin, including inside the mouth -signs and symptoms of bleeding such as bloody or black, tarry stools; red or dark-brown urine; spitting up blood or brown material that looks like coffee grounds; red spots on the skin; unusual bruising or bleeding from the eye, gums, or nose -signs and symptoms of infection like fever or chills; cough; sore throat; pain or trouble passing urine -signs and symptoms of kidney injury like trouble passing urine or change  in the amount of urine -signs and symptoms of liver injury like dark yellow or brown urine; general ill feeling or flu-like symptoms; light-colored stools; loss of appetite; nausea; right upper belly pain; unusually weak or tired; yellowing of the eyes or skin Side effects that usually do not require medical attention (report to your doctor or health care professional if they continue or are bothersome): -constipation -dizziness -mouth sores -nausea, vomiting -pain, tingling, numbness in the hands or feet -unusually weak or tired This list may not describe all possible side effects. Call your doctor for medical advice about side effects. You may report side effects to FDA at 1-800-FDA-1088. Where should I keep my medicine? This drug is given in a hospital or clinic and will not be stored at home. NOTE: This sheet is a summary. It may not cover all possible information. If you have questions about this medicine, talk to your doctor, pharmacist, or health care provider.  2018 Elsevier/Gold Standard (2016-09-15 18:51:46)  Carboplatin injection What is this medicine? CARBOPLATIN (KAR boe pla tin) is a chemotherapy drug. It targets fast dividing cells, like cancer cells, and causes these cells to die. This medicine is used to treat ovarian cancer and many other cancers. This medicine may be used for other purposes; ask your health care provider or pharmacist if you have questions. COMMON BRAND NAME(S): Paraplatin What should I tell my health care provider before I take this medicine? They need to know if you have any of these conditions: -blood disorders -hearing problems -kidney disease -recent or ongoing radiation therapy -an unusual or allergic reaction to carboplatin, cisplatin, other chemotherapy, other medicines, foods, dyes, or preservatives -pregnant or trying to get pregnant -breast-feeding How should I use this medicine? This drug is usually given as an infusion into a vein. It is  administered in a hospital or clinic by a specially trained health care professional. Talk to your pediatrician regarding the use of this medicine in children. Special care may be needed. Overdosage: If you think you have taken too much of this medicine contact a poison control center or emergency room at once. NOTE: This medicine is only for you. Do not share this medicine with others. What if I miss a dose? It is important not to miss a dose. Call your doctor or health care professional if you are unable to keep an appointment. What may interact with this medicine? -medicines for seizures -medicines to increase blood counts like filgrastim, pegfilgrastim, sargramostim -some antibiotics like amikacin, gentamicin, neomycin, streptomycin, tobramycin -vaccines Talk to your doctor or health care professional before taking any of these medicines: -acetaminophen -aspirin -  ibuprofen -ketoprofen -naproxen This list may not describe all possible interactions. Give your health care provider a list of all the medicines, herbs, non-prescription drugs, or dietary supplements you use. Also tell them if you smoke, drink alcohol, or use illegal drugs. Some items may interact with your medicine. What should I watch for while using this medicine? Your condition will be monitored carefully while you are receiving this medicine. You will need important blood work done while you are taking this medicine. This drug may make you feel generally unwell. This is not uncommon, as chemotherapy can affect healthy cells as well as cancer cells. Report any side effects. Continue your course of treatment even though you feel ill unless your doctor tells you to stop. In some cases, you may be given additional medicines to help with side effects. Follow all directions for their use. Call your doctor or health care professional for advice if you get a fever, chills or sore throat, or other symptoms of a cold or flu. Do not  treat yourself. This drug decreases your body's ability to fight infections. Try to avoid being around people who are sick. This medicine may increase your risk to bruise or bleed. Call your doctor or health care professional if you notice any unusual bleeding. Be careful brushing and flossing your teeth or using a toothpick because you may get an infection or bleed more easily. If you have any dental work done, tell your dentist you are receiving this medicine. Avoid taking products that contain aspirin, acetaminophen, ibuprofen, naproxen, or ketoprofen unless instructed by your doctor. These medicines may hide a fever. Do not become pregnant while taking this medicine. Women should inform their doctor if they wish to become pregnant or think they might be pregnant. There is a potential for serious side effects to an unborn child. Talk to your health care professional or pharmacist for more information. Do not breast-feed an infant while taking this medicine. What side effects may I notice from receiving this medicine? Side effects that you should report to your doctor or health care professional as soon as possible: -allergic reactions like skin rash, itching or hives, swelling of the face, lips, or tongue -signs of infection - fever or chills, cough, sore throat, pain or difficulty passing urine -signs of decreased platelets or bleeding - bruising, pinpoint red spots on the skin, black, tarry stools, nosebleeds -signs of decreased red blood cells - unusually weak or tired, fainting spells, lightheadedness -breathing problems -changes in hearing -changes in vision -chest pain -high blood pressure -low blood counts - This drug may decrease the number of white blood cells, red blood cells and platelets. You may be at increased risk for infections and bleeding. -nausea and vomiting -pain, swelling, redness or irritation at the injection site -pain, tingling, numbness in the hands or feet -problems  with balance, talking, walking -trouble passing urine or change in the amount of urine Side effects that usually do not require medical attention (report to your doctor or health care professional if they continue or are bothersome): -hair loss -loss of appetite -metallic taste in the mouth or changes in taste This list may not describe all possible side effects. Call your doctor for medical advice about side effects. You may report side effects to FDA at 1-800-FDA-1088. Where should I keep my medicine? This drug is given in a hospital or clinic and will not be stored at home. NOTE: This sheet is a summary. It may not cover all possible information.  If you have questions about this medicine, talk to your doctor, pharmacist, or health care provider.  2018 Elsevier/Gold Standard (2008-02-21 14:38:05)

## 2017-02-25 ENCOUNTER — Telehealth: Payer: Self-pay | Admitting: Medical Oncology

## 2017-02-25 NOTE — Telephone Encounter (Signed)
Pt is out now at [facility]. He is doing well eating and drinking fluids. I instructed wife to tell pt to call if he has any problems

## 2017-03-03 ENCOUNTER — Ambulatory Visit (HOSPITAL_BASED_OUTPATIENT_CLINIC_OR_DEPARTMENT_OTHER): Payer: Medicare Other

## 2017-03-03 ENCOUNTER — Other Ambulatory Visit (HOSPITAL_BASED_OUTPATIENT_CLINIC_OR_DEPARTMENT_OTHER): Payer: Medicare Other

## 2017-03-03 DIAGNOSIS — Z452 Encounter for adjustment and management of vascular access device: Secondary | ICD-10-CM

## 2017-03-03 DIAGNOSIS — C3432 Malignant neoplasm of lower lobe, left bronchus or lung: Secondary | ICD-10-CM

## 2017-03-03 DIAGNOSIS — R5382 Chronic fatigue, unspecified: Secondary | ICD-10-CM

## 2017-03-03 DIAGNOSIS — C3492 Malignant neoplasm of unspecified part of left bronchus or lung: Secondary | ICD-10-CM

## 2017-03-03 DIAGNOSIS — Z95828 Presence of other vascular implants and grafts: Secondary | ICD-10-CM | POA: Insufficient documentation

## 2017-03-03 DIAGNOSIS — Z5112 Encounter for antineoplastic immunotherapy: Secondary | ICD-10-CM

## 2017-03-03 LAB — CBC WITH DIFFERENTIAL/PLATELET
BASO%: 0.5 % (ref 0.0–2.0)
Basophils Absolute: 0 10*3/uL (ref 0.0–0.1)
EOS ABS: 0.1 10*3/uL (ref 0.0–0.5)
EOS%: 4.1 % (ref 0.0–7.0)
HCT: 50.1 % — ABNORMAL HIGH (ref 38.4–49.9)
HGB: 17.4 g/dL — ABNORMAL HIGH (ref 13.0–17.1)
LYMPH%: 33.2 % (ref 14.0–49.0)
MCH: 32.5 pg (ref 27.2–33.4)
MCHC: 34.7 g/dL (ref 32.0–36.0)
MCV: 93.6 fL (ref 79.3–98.0)
MONO#: 0.1 10*3/uL (ref 0.1–0.9)
MONO%: 2 % (ref 0.0–14.0)
NEUT%: 60.2 % (ref 39.0–75.0)
NEUTROS ABS: 1.9 10*3/uL (ref 1.5–6.5)
Platelets: 76 10*3/uL — ABNORMAL LOW (ref 140–400)
RBC: 5.35 10*6/uL (ref 4.20–5.82)
RDW: 14 % (ref 11.0–14.6)
WBC: 3.2 10*3/uL — AB (ref 4.0–10.3)
lymph#: 1.1 10*3/uL (ref 0.9–3.3)

## 2017-03-03 LAB — COMPREHENSIVE METABOLIC PANEL
ALT: 32 U/L (ref 0–55)
AST: 18 U/L (ref 5–34)
Albumin: 3.3 g/dL — ABNORMAL LOW (ref 3.5–5.0)
Alkaline Phosphatase: 76 U/L (ref 40–150)
Anion Gap: 9 mEq/L (ref 3–11)
BILIRUBIN TOTAL: 0.79 mg/dL (ref 0.20–1.20)
BUN: 19.3 mg/dL (ref 7.0–26.0)
CHLORIDE: 102 meq/L (ref 98–109)
CO2: 25 meq/L (ref 22–29)
Calcium: 8.6 mg/dL (ref 8.4–10.4)
Creatinine: 1 mg/dL (ref 0.7–1.3)
EGFR: 75 mL/min/{1.73_m2} — AB (ref >90)
GLUCOSE: 181 mg/dL — AB (ref 70–140)
Potassium: 4.6 mEq/L (ref 3.5–5.1)
SODIUM: 136 meq/L (ref 136–145)
TOTAL PROTEIN: 6.6 g/dL (ref 6.4–8.3)

## 2017-03-03 MED ORDER — SODIUM CHLORIDE 0.9% FLUSH
10.0000 mL | INTRAVENOUS | Status: DC | PRN
  Administered 2017-03-03: 10 mL via INTRAVENOUS
  Filled 2017-03-03: qty 10

## 2017-03-03 MED ORDER — HEPARIN SOD (PORK) LOCK FLUSH 100 UNIT/ML IV SOLN
500.0000 [IU] | Freq: Once | INTRAVENOUS | Status: AC | PRN
  Administered 2017-03-03: 500 [IU] via INTRAVENOUS
  Filled 2017-03-03: qty 5

## 2017-03-10 ENCOUNTER — Other Ambulatory Visit (HOSPITAL_BASED_OUTPATIENT_CLINIC_OR_DEPARTMENT_OTHER): Payer: Medicare Other

## 2017-03-10 ENCOUNTER — Ambulatory Visit: Payer: Medicare Other | Admitting: Internal Medicine

## 2017-03-10 ENCOUNTER — Ambulatory Visit: Payer: Medicare Other

## 2017-03-10 ENCOUNTER — Ambulatory Visit (HOSPITAL_BASED_OUTPATIENT_CLINIC_OR_DEPARTMENT_OTHER): Payer: Medicare Other

## 2017-03-10 ENCOUNTER — Other Ambulatory Visit: Payer: Medicare Other

## 2017-03-10 VITALS — BP 142/60 | HR 64 | Temp 98.2°F | Resp 18

## 2017-03-10 DIAGNOSIS — C3432 Malignant neoplasm of lower lobe, left bronchus or lung: Secondary | ICD-10-CM

## 2017-03-10 DIAGNOSIS — Z95828 Presence of other vascular implants and grafts: Secondary | ICD-10-CM

## 2017-03-10 DIAGNOSIS — C3492 Malignant neoplasm of unspecified part of left bronchus or lung: Secondary | ICD-10-CM

## 2017-03-10 DIAGNOSIS — Z5112 Encounter for antineoplastic immunotherapy: Secondary | ICD-10-CM

## 2017-03-10 DIAGNOSIS — Z79899 Other long term (current) drug therapy: Secondary | ICD-10-CM | POA: Diagnosis not present

## 2017-03-10 DIAGNOSIS — R5382 Chronic fatigue, unspecified: Secondary | ICD-10-CM

## 2017-03-10 DIAGNOSIS — Z452 Encounter for adjustment and management of vascular access device: Secondary | ICD-10-CM | POA: Diagnosis not present

## 2017-03-10 LAB — CBC WITH DIFFERENTIAL/PLATELET
BASO%: 0.4 % (ref 0.0–2.0)
BASOS ABS: 0 10*3/uL (ref 0.0–0.1)
EOS%: 1.4 % (ref 0.0–7.0)
Eosinophils Absolute: 0 10*3/uL (ref 0.0–0.5)
HEMATOCRIT: 43.6 % (ref 38.4–49.9)
HGB: 14.9 g/dL (ref 13.0–17.1)
LYMPH%: 40.1 % (ref 14.0–49.0)
MCH: 32.4 pg (ref 27.2–33.4)
MCHC: 34.2 g/dL (ref 32.0–36.0)
MCV: 94.9 fL (ref 79.3–98.0)
MONO#: 0.3 10*3/uL (ref 0.1–0.9)
MONO%: 11.1 % (ref 0.0–14.0)
NEUT#: 1.3 10*3/uL — ABNORMAL LOW (ref 1.5–6.5)
NEUT%: 47 % (ref 39.0–75.0)
Platelets: 111 10*3/uL — ABNORMAL LOW (ref 140–400)
RBC: 4.59 10*6/uL (ref 4.20–5.82)
RDW: 13.6 % (ref 11.0–14.6)
WBC: 2.7 10*3/uL — ABNORMAL LOW (ref 4.0–10.3)
lymph#: 1.1 10*3/uL (ref 0.9–3.3)

## 2017-03-10 LAB — COMPREHENSIVE METABOLIC PANEL
ALT: 48 U/L (ref 0–55)
AST: 26 U/L (ref 5–34)
Albumin: 3 g/dL — ABNORMAL LOW (ref 3.5–5.0)
Alkaline Phosphatase: 67 U/L (ref 40–150)
Anion Gap: 11 mEq/L (ref 3–11)
BUN: 11.4 mg/dL (ref 7.0–26.0)
CALCIUM: 8.2 mg/dL — AB (ref 8.4–10.4)
CHLORIDE: 110 meq/L — AB (ref 98–109)
CO2: 20 mEq/L — ABNORMAL LOW (ref 22–29)
Creatinine: 0.9 mg/dL (ref 0.7–1.3)
EGFR: 81 mL/min/{1.73_m2} — AB (ref >90)
Glucose: 164 mg/dl — ABNORMAL HIGH (ref 70–140)
POTASSIUM: 4 meq/L (ref 3.5–5.1)
SODIUM: 140 meq/L (ref 136–145)
Total Bilirubin: 0.28 mg/dL (ref 0.20–1.20)
Total Protein: 6 g/dL — ABNORMAL LOW (ref 6.4–8.3)

## 2017-03-10 LAB — TSH: TSH: 2.691 m(IU)/L (ref 0.320–4.118)

## 2017-03-10 MED ORDER — HEPARIN SOD (PORK) LOCK FLUSH 100 UNIT/ML IV SOLN
500.0000 [IU] | Freq: Once | INTRAVENOUS | Status: AC | PRN
  Administered 2017-03-10: 500 [IU] via INTRAVENOUS
  Filled 2017-03-10: qty 5

## 2017-03-10 MED ORDER — SODIUM CHLORIDE 0.9% FLUSH
10.0000 mL | INTRAVENOUS | Status: DC | PRN
  Administered 2017-03-10: 10 mL via INTRAVENOUS
  Filled 2017-03-10: qty 10

## 2017-03-10 NOTE — Patient Instructions (Signed)

## 2017-03-11 ENCOUNTER — Telehealth: Payer: Self-pay | Admitting: *Deleted

## 2017-03-11 ENCOUNTER — Other Ambulatory Visit: Payer: Self-pay | Admitting: Medical Oncology

## 2017-03-11 DIAGNOSIS — K121 Other forms of stomatitis: Secondary | ICD-10-CM

## 2017-03-11 MED ORDER — MAGIC MOUTHWASH: 5.0000 mL | Freq: Four times a day (QID) | ORAL | 1 refills | Status: DC

## 2017-03-11 NOTE — Telephone Encounter (Signed)
"  I woke up during the night with a real sore throat.  I do not have a fever.  I have not had a sore throat in forty years.  My wife saw a little off whit spot in the back where I swallow.  It is uncomfortable to swallow.  Use [facility] on [address].  Return number [phone].  Please have doctor call."   Will notify provider. Nursing instructions provided with call.  Use a straw to drink, eat soft foods that are easier to swallow, rinse mouth with water after meals and use Biotene products which are alcohol free.

## 2017-03-11 NOTE — Progress Notes (Signed)
Called in magic mouthwash.

## 2017-03-17 ENCOUNTER — Encounter: Payer: Self-pay | Admitting: Internal Medicine

## 2017-03-17 ENCOUNTER — Telehealth: Payer: Self-pay | Admitting: Internal Medicine

## 2017-03-17 ENCOUNTER — Ambulatory Visit: Payer: Medicare Other

## 2017-03-17 ENCOUNTER — Ambulatory Visit (HOSPITAL_BASED_OUTPATIENT_CLINIC_OR_DEPARTMENT_OTHER): Payer: Medicare Other

## 2017-03-17 ENCOUNTER — Ambulatory Visit (HOSPITAL_BASED_OUTPATIENT_CLINIC_OR_DEPARTMENT_OTHER): Payer: Medicare Other | Admitting: Internal Medicine

## 2017-03-17 VITALS — BP 143/79 | HR 77 | Temp 97.8°F | Resp 18 | Ht 71.0 in | Wt 207.0 lb

## 2017-03-17 DIAGNOSIS — C3432 Malignant neoplasm of lower lobe, left bronchus or lung: Secondary | ICD-10-CM

## 2017-03-17 DIAGNOSIS — C778 Secondary and unspecified malignant neoplasm of lymph nodes of multiple regions: Secondary | ICD-10-CM | POA: Diagnosis not present

## 2017-03-17 DIAGNOSIS — K1379 Other lesions of oral mucosa: Secondary | ICD-10-CM | POA: Diagnosis not present

## 2017-03-17 DIAGNOSIS — C801 Malignant (primary) neoplasm, unspecified: Secondary | ICD-10-CM

## 2017-03-17 DIAGNOSIS — Z5112 Encounter for antineoplastic immunotherapy: Secondary | ICD-10-CM

## 2017-03-17 DIAGNOSIS — I313 Pericardial effusion (noninflammatory): Principal | ICD-10-CM

## 2017-03-17 DIAGNOSIS — C3492 Malignant neoplasm of unspecified part of left bronchus or lung: Secondary | ICD-10-CM

## 2017-03-17 DIAGNOSIS — Z5111 Encounter for antineoplastic chemotherapy: Secondary | ICD-10-CM

## 2017-03-17 DIAGNOSIS — I3131 Malignant pericardial effusion in diseases classified elsewhere: Secondary | ICD-10-CM

## 2017-03-17 LAB — CBC WITH DIFFERENTIAL/PLATELET
BASO%: 0.2 % (ref 0.0–2.0)
Basophils Absolute: 0 10*3/uL (ref 0.0–0.1)
EOS ABS: 0 10*3/uL (ref 0.0–0.5)
EOS%: 0 % (ref 0.0–7.0)
HEMATOCRIT: 43.6 % (ref 38.4–49.9)
HGB: 14.9 g/dL (ref 13.0–17.1)
LYMPH#: 1.1 10*3/uL (ref 0.9–3.3)
LYMPH%: 8.7 % — AB (ref 14.0–49.0)
MCH: 32.2 pg (ref 27.2–33.4)
MCHC: 34.2 g/dL (ref 32.0–36.0)
MCV: 94.2 fL (ref 79.3–98.0)
MONO#: 1.1 10*3/uL — AB (ref 0.1–0.9)
MONO%: 8.9 % (ref 0.0–14.0)
NEUT#: 10 10*3/uL — ABNORMAL HIGH (ref 1.5–6.5)
NEUT%: 82.2 % — AB (ref 39.0–75.0)
PLATELETS: 430 10*3/uL — AB (ref 140–400)
RBC: 4.63 10*6/uL (ref 4.20–5.82)
RDW: 14 % (ref 11.0–14.6)
WBC: 12.1 10*3/uL — ABNORMAL HIGH (ref 4.0–10.3)

## 2017-03-17 LAB — UA PROTEIN, DIPSTICK - CHCC: Protein, ur: NEGATIVE mg/dL

## 2017-03-17 LAB — COMPREHENSIVE METABOLIC PANEL
ALT: 60 U/L — AB (ref 0–55)
AST: 28 U/L (ref 5–34)
Albumin: 3.1 g/dL — ABNORMAL LOW (ref 3.5–5.0)
Alkaline Phosphatase: 83 U/L (ref 40–150)
Anion Gap: 11 mEq/L (ref 3–11)
BUN: 17.1 mg/dL (ref 7.0–26.0)
CALCIUM: 9.3 mg/dL (ref 8.4–10.4)
CHLORIDE: 108 meq/L (ref 98–109)
CO2: 21 mEq/L — ABNORMAL LOW (ref 22–29)
CREATININE: 1 mg/dL (ref 0.7–1.3)
EGFR: 75 mL/min/{1.73_m2} — ABNORMAL LOW (ref >90)
GLUCOSE: 133 mg/dL (ref 70–140)
Potassium: 4.4 mEq/L (ref 3.5–5.1)
Sodium: 139 mEq/L (ref 136–145)
TOTAL PROTEIN: 7.4 g/dL (ref 6.4–8.3)
Total Bilirubin: 0.26 mg/dL (ref 0.20–1.20)

## 2017-03-17 LAB — TECHNOLOGIST REVIEW

## 2017-03-17 MED ORDER — SODIUM CHLORIDE 0.9% FLUSH
10.0000 mL | INTRAVENOUS | Status: DC | PRN
  Administered 2017-03-17: 10 mL
  Filled 2017-03-17: qty 10

## 2017-03-17 MED ORDER — PALONOSETRON HCL INJECTION 0.25 MG/5ML
INTRAVENOUS | Status: AC
  Filled 2017-03-17: qty 5

## 2017-03-17 MED ORDER — SODIUM CHLORIDE 0.9 % IV SOLN
Freq: Once | INTRAVENOUS | Status: AC
  Administered 2017-03-17: 15:00:00 via INTRAVENOUS
  Filled 2017-03-17: qty 5

## 2017-03-17 MED ORDER — PALONOSETRON HCL INJECTION 0.25 MG/5ML
0.2500 mg | Freq: Once | INTRAVENOUS | Status: AC
  Administered 2017-03-17: 0.25 mg via INTRAVENOUS

## 2017-03-17 MED ORDER — SODIUM CHLORIDE 0.9 % IV SOLN
14.7000 mg/kg | Freq: Once | INTRAVENOUS | Status: AC
  Administered 2017-03-17: 1400 mg via INTRAVENOUS
  Filled 2017-03-17: qty 48

## 2017-03-17 MED ORDER — SODIUM CHLORIDE 0.9 % IV SOLN
Freq: Once | INTRAVENOUS | Status: AC
  Administered 2017-03-17: 14:00:00 via INTRAVENOUS

## 2017-03-17 MED ORDER — PEMETREXED DISODIUM CHEMO INJECTION 500 MG
500.0000 mg/m2 | Freq: Once | INTRAVENOUS | Status: AC
Start: 2017-03-17 — End: 2017-03-17
  Administered 2017-03-17: 1100 mg via INTRAVENOUS
  Filled 2017-03-17: qty 40

## 2017-03-17 MED ORDER — HEPARIN SOD (PORK) LOCK FLUSH 100 UNIT/ML IV SOLN
500.0000 [IU] | Freq: Once | INTRAVENOUS | Status: AC | PRN
  Administered 2017-03-17: 500 [IU]
  Filled 2017-03-17: qty 5

## 2017-03-17 MED ORDER — SODIUM CHLORIDE 0.9 % IV SOLN
589.0000 mg | Freq: Once | INTRAVENOUS | Status: AC
  Administered 2017-03-17: 590 mg via INTRAVENOUS
  Filled 2017-03-17: qty 59

## 2017-03-17 NOTE — Progress Notes (Signed)
[place] [phone]   [phone]  OFFICE PROGRESS NOTE  [name], MD [place] [address]  DIAGNOSIS: Stage IV (T1a, N3, M1b) non-small cell lung cancer, adenocarcinoma with PDL 1 expression of 80%. 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  PRIOR THERAPY: Treatment with immunotherapy with [name] (pembrolizumab) 200 MG IV every 3 weeks status post 6 cycles discontinued secondary to disease progression..  CURRENT THERAPY: Systemic chemotherapy with carboplatin for AUC of 5, Alimta 500 MG/M2 and Avastin 15 MG/KG every 3 weeks. First dose 02/24/2017. Status post one cycle.  INTERVAL HISTORY: Nicholas Hale 71 y.o. male returns to the clinic today for follow-up visit accompanied by his wife. The patient tolerated the first cycle of his treatment well with no significant adverse effects except for mild fatigue and cough from postnasal drainage. He also has some mouth sores improved with the Magic mouthwash. He has mild hoarseness of his voice. He had having any chest pain, shortness of breath or hemoptysis. He denied having any weight loss or night sweats. He has no nausea, vomiting, diarrhea or constipation. He denied having any fever or chills. He is here today for evaluation before starting cycle #2.  MEDICAL HISTORY: Past Medical History:  Diagnosis Date  . Abdominal aortic aneurysm ([place])   . Arthritis    "hips; lower spine" (05/24/2014)  . Chronic fatigue 10/05/2016  . Encounter for antineoplastic chemotherapy 02/17/2017  . Hyperlipidemia   . Hypertension   . Lung cancer ([place]) 09/25/2016  . PE (pulmonary embolism) 9/15  . Pericardial effusion 09/20/2016   Malignant effusion s/p pericardial drain  . 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  MEDICATIONS:  Current Outpatient Prescriptions  Medication Sig Dispense Refill  . atorvastatin (LIPITOR) 20 MG tablet Take 1 tablet (20 mg total) by mouth daily at 6 PM. 90 tablet 3  . dexamethasone (DECADRON) 4 MG tablet 4 mg by mouth twice a day the day before, day of and day after the chemotherapy every 3 weeks. 40 tablet 1  . diphenhydrAMINE (BENADRYL) 25 mg capsule Take 25 mg by mouth every 6 (six) hours as needed.    . folic acid (FOLVITE) 1 MG tablet Take 1 tablet (1 mg total) by mouth daily. 30 tablet 4  . hydrocortisone (CORTEF) 20 MG tablet     . lidocaine-prilocaine (EMLA) cream Apply 1 application topically as needed. 30 g 0  . magic mouthwash SOLN Take 5 mLs by mouth 4 (four) times daily. Hydrocortisone 60 mg ,. Nystatin 30 ml Benadryl 12.5 mg/5 ml. Mix to 240 ml 240 mL 1  . prochlorperazine (COMPAZINE) 10 MG tablet Take 1 tablet (10 mg total) by mouth every 6 (six) hours as needed for nausea or vomiting. 30 tablet 0  . rivaroxaban (XARELTO) 20 MG TABS tablet Take 1 tablet (20 mg total) by mouth daily with supper. 30 tablet 5   No current facility-administered medications for this visit.     SURGICAL HISTORY:  Past Surgical History:  Procedure Laterality Date  . ANTERIOR FUSION CERVICAL SPINE  ~ 2005  . APPENDECTOMY  1978  . CARDIAC CATHETERIZATION N/A 09/21/2016   Procedure: Pericardiocentesis;  Surgeon: Jettie Booze, MD;  Location: [facility];  Service: Cardiovascular;  Laterality:  N/A;  . COLONOSCOPY    . HEMIARTHROPLASTY SHOULDER FRACTURE Left 12/2008  . INGUINAL HERNIA REPAIR Bilateral 1992  . IR GENERIC HISTORICAL  02/01/2017   IR FLUORO GUIDE PORT INSERTION RIGHT 02/01/2017 Arne Cleveland, MD [facility]  . IR GENERIC HISTORICAL  02/01/2017   IR US GUIDE VASC ACCESS RIGHT 02/01/2017 Arne Cleveland, MD [facility]  . JOINT REPLACEMENT    . PERICARDIAL FLUID DRAINAGE  09/21/2016  . TOTAL HIP  ARTHROPLASTY Left 05/23/2014   Procedure: TOTAL HIP ARTHROPLASTY;  Surgeon: Kerin Salen, MD;  Location: [facility];  Service: Orthopedics;  Laterality: Left;  . TOTAL HIP ARTHROPLASTY Right 05/15/2015   Procedure: TOTAL HIP ARTHROPLASTY;  Surgeon: Frederik Pear, MD;  Location: [facility];  Service: Orthopedics;  Laterality: Right;    REVIEW OF SYSTEMS:  A comprehensive review of systems was negative except for: Constitutional: positive for fatigue Ears, nose, mouth, throat, and face: positive for hoarseness Respiratory: positive for cough   PHYSICAL EXAMINATION: General appearance: alert, cooperative and no distress Head: Normocephalic, without obvious abnormality, atraumatic Neck: no adenopathy, no JVD, supple, symmetrical, trachea midline and thyroid not enlarged, symmetric, no tenderness/mass/nodules Lymph nodes: Cervical, supraclavicular, and axillary nodes normal. Resp: clear to auscultation bilaterally Back: symmetric, no curvature. ROM normal. No CVA tenderness. Cardio: regular rate and rhythm, S1, S2 normal, no murmur, click, rub or gallop GI: soft, non-tender; bowel sounds normal; no masses,  no organomegaly Extremities: extremities normal, atraumatic, no cyanosis or edema  ECOG PERFORMANCE STATUS: 1 - Symptomatic but completely ambulatory  Blood pressure (!) 143/79, pulse 77, temperature 97.8 F (36.6 C), temperature source Oral, resp. rate 18, height '5\' 11"'$  (1.803 m), weight 207 lb (93.9 kg), SpO2 99 %.  LABORATORY DATA: Lab Results  Component Value Date   WBC 12.1 (H) 03/17/2017   HGB 14.9 03/17/2017   HCT 43.6 03/17/2017   MCV 94.2 03/17/2017   PLT 430 (H) 03/17/2017      Chemistry      Component Value Date/Time   NA 139 03/17/2017 1141   K 4.4 03/17/2017 1141   CL 107 09/23/2016 0557   CO2 21 (L) 03/17/2017 1141   BUN 17.1 03/17/2017 1141   CREATININE 1.0 03/17/2017 1141      Component Value Date/Time   CALCIUM 9.3 03/17/2017 1141   ALKPHOS 83 03/17/2017 1141   AST  28 03/17/2017 1141   ALT 60 (H) 03/17/2017 1141   BILITOT 0.26 03/17/2017 1141       RADIOGRAPHIC STUDIES: No results found.  ASSESSMENT AND PLAN:  This is a very pleasant 71 years old white male with a stage IV non-small cell lung cancer, adenocarcinoma with PDL 1 expression of 80%. Status post treatment with immunotherapy with [name] (pembrolizumab) status post 6 cycles and has been tolerating the treatment well but unfortunately has evidence for disease progression after cycle #6. This treatment was discontinued. The patient is currently undergoing treatment with systemic chemotherapy with carboplatin, Alimta and Avastin status post 1 cycle and tolerated the first cycle of his treatment well with no significant adverse effects. I recommended for the patient to proceed with cycle #2 today as scheduled. For the mouth sores, I will give him a refill of the Magic mouthwash. I will see him back for follow-up visit in 3 weeks for evaluation before starting the next dose of his treatment. The patient was advised to call immediately if he has any concerning symptoms in the interval. The patient voices understanding of current disease status  and treatment options and is in agreement with the current care plan. All questions were answered. The patient knows to call the clinic with any problems, questions or concerns. We can certainly see the patient much sooner if necessary. I spent 10 minutes counseling the patient face to face. The total time spent in the appointment was 15 minutes.  Disclaimer: This note was dictated with voice recognition software. Similar sounding words can inadvertently be transcribed and may not be corrected upon review.

## 2017-03-17 NOTE — Telephone Encounter (Signed)
Appointments scheduled per 4.18.18 LOS. Patient given AVS report and calendars with future scheduled appointments. °

## 2017-03-17 NOTE — Patient Instructions (Signed)
[facility] Discharge Instructions for Patients Receiving Chemotherapy  Today you received the following chemotherapy agents:  Avastin, Alimta, and Carboplatin.  To help prevent nausea and vomiting after your treatment, we encourage you to take your nausea medication as directed.   If you develop nausea and vomiting that is not controlled by your nausea medication, call the clinic.   BELOW ARE SYMPTOMS THAT SHOULD BE REPORTED IMMEDIATELY:  *FEVER GREATER THAN 100.5 F  *CHILLS WITH OR WITHOUT FEVER  NAUSEA AND VOMITING THAT IS NOT CONTROLLED WITH YOUR NAUSEA MEDICATION  *UNUSUAL SHORTNESS OF BREATH  *UNUSUAL BRUISING OR BLEEDING  TENDERNESS IN MOUTH AND THROAT WITH OR WITHOUT PRESENCE OF ULCERS  *URINARY PROBLEMS  *BOWEL PROBLEMS  UNUSUAL RASH Items with * indicate a potential emergency and should be followed up as soon as possible.  Feel free to call the clinic you have any questions or concerns. The clinic phone number is [phone].  Please show the CHEMO ALERT CARD at check-in to the Emergency Department and triage nurse. 

## 2017-03-17 NOTE — Patient Instructions (Signed)
Implanted Port Home Guide An implanted port is a type of central line that is placed under the skin. Central lines are used to provide IV access when treatment or nutrition needs to be given through a person's veins. Implanted ports are used for long-term IV access. An implanted port may be placed because:  You need IV medicine that would be irritating to the small veins in your hands or arms.  You need long-term IV medicines, such as antibiotics.  You need IV nutrition for a long period.  You need frequent blood draws for lab tests.  You need dialysis.  Implanted ports are usually placed in the chest area, but they can also be placed in the upper arm, the abdomen, or the leg. An implanted port has two main parts:  Reservoir. The reservoir is round and will appear as a small, raised area under your skin. The reservoir is the part where a needle is inserted to give medicines or draw blood.  Catheter. The catheter is a thin, flexible tube that extends from the reservoir. The catheter is placed into a large vein. Medicine that is inserted into the reservoir goes into the catheter and then into the vein.  How will I care for my incision site? Do not get the incision site wet. Bathe or shower as directed by your health care provider. How is my port accessed? Special steps must be taken to access the port:  Before the port is accessed, a numbing cream can be placed on the skin. This helps numb the skin over the port site.  Your health care provider uses a sterile technique to access the port. ? Your health care provider must put on a mask and sterile gloves. ? The skin over your port is cleaned carefully with an antiseptic and allowed to dry. ? The port is gently pinched between sterile gloves, and a needle is inserted into the port.  Only "non-coring" port needles should be used to access the port. Once the port is accessed, a blood return should be checked. This helps ensure that the port  is in the vein and is not clogged.  If your port needs to remain accessed for a constant infusion, a clear (transparent) bandage will be placed over the needle site. The bandage and needle will need to be changed every week, or as directed by your health care provider.  Keep the bandage covering the needle clean and dry. Do not get it wet. Follow your health care provider's instructions on how to take a shower or bath while the port is accessed.  If your port does not need to stay accessed, no bandage is needed over the port.  What is flushing? Flushing helps keep the port from getting clogged. Follow your health care provider's instructions on how and when to flush the port. Ports are usually flushed with saline solution or a medicine called heparin. The need for flushing will depend on how the port is used.  If the port is used for intermittent medicines or blood draws, the port will need to be flushed: ? After medicines have been given. ? After blood has been drawn. ? As part of routine maintenance.  If a constant infusion is running, the port may not need to be flushed.  How long will my port stay implanted? The port can stay in for as long as your health care provider thinks it is needed. When it is time for the port to come out, surgery will be   done to remove it. The procedure is similar to the one performed when the port was put in. When should I seek immediate medical care? When you have an implanted port, you should seek immediate medical care if:  You notice a bad smell coming from the incision site.  You have swelling, redness, or drainage at the incision site.  You have more swelling or pain at the port site or the surrounding area.  You have a fever that is not controlled with medicine.  This information is not intended to replace advice given to you by your health care provider. Make sure you discuss any questions you have with your health care provider. Document  Released: 11/16/2005 Document Revised: 04/23/2016 Document Reviewed: 07/24/2013 Elsevier Interactive Patient Education  2017 [employer].  

## 2017-03-17 NOTE — Patient Instructions (Signed)
[facility] Discharge Instructions for Patients Receiving Chemotherapy  Today you received the following chemotherapy agents:  Avastin, Alimta, and Carboplatin.  To help prevent nausea and vomiting after your treatment, we encourage you to take your nausea medication as directed.   If you develop nausea and vomiting that is not controlled by your nausea medication, call the clinic.   BELOW ARE SYMPTOMS THAT SHOULD BE REPORTED IMMEDIATELY:  *FEVER GREATER THAN 100.5 F  *CHILLS WITH OR WITHOUT FEVER  NAUSEA AND VOMITING THAT IS NOT CONTROLLED WITH YOUR NAUSEA MEDICATION  *UNUSUAL SHORTNESS OF BREATH  *UNUSUAL BRUISING OR BLEEDING  TENDERNESS IN MOUTH AND THROAT WITH OR WITHOUT PRESENCE OF ULCERS  *URINARY PROBLEMS  *BOWEL PROBLEMS  UNUSUAL RASH Items with * indicate a potential emergency and should be followed up as soon as possible.  Feel free to call the clinic you have any questions or concerns. The clinic phone number is [phone].  Please show the CHEMO ALERT CARD at check-in to the Emergency Department and triage nurse. 

## 2017-03-23 ENCOUNTER — Other Ambulatory Visit: Payer: Self-pay | Admitting: Adult Health

## 2017-03-24 ENCOUNTER — Ambulatory Visit (HOSPITAL_BASED_OUTPATIENT_CLINIC_OR_DEPARTMENT_OTHER): Payer: Medicare Other

## 2017-03-24 ENCOUNTER — Other Ambulatory Visit (HOSPITAL_BASED_OUTPATIENT_CLINIC_OR_DEPARTMENT_OTHER): Payer: Medicare Other

## 2017-03-24 VITALS — BP 141/77 | HR 81 | Temp 98.5°F | Resp 18

## 2017-03-24 DIAGNOSIS — C3492 Malignant neoplasm of unspecified part of left bronchus or lung: Secondary | ICD-10-CM

## 2017-03-24 DIAGNOSIS — Z95828 Presence of other vascular implants and grafts: Secondary | ICD-10-CM

## 2017-03-24 DIAGNOSIS — C3432 Malignant neoplasm of lower lobe, left bronchus or lung: Secondary | ICD-10-CM | POA: Diagnosis not present

## 2017-03-24 DIAGNOSIS — Z452 Encounter for adjustment and management of vascular access device: Secondary | ICD-10-CM | POA: Diagnosis not present

## 2017-03-24 LAB — COMPREHENSIVE METABOLIC PANEL
ALK PHOS: 76 U/L (ref 40–150)
ALT: 62 U/L — ABNORMAL HIGH (ref 0–55)
AST: 34 U/L (ref 5–34)
Albumin: 3.2 g/dL — ABNORMAL LOW (ref 3.5–5.0)
Anion Gap: 9 mEq/L (ref 3–11)
BILIRUBIN TOTAL: 0.7 mg/dL (ref 0.20–1.20)
BUN: 16 mg/dL (ref 7.0–26.0)
CO2: 24 mEq/L (ref 22–29)
Calcium: 8.7 mg/dL (ref 8.4–10.4)
Chloride: 104 mEq/L (ref 98–109)
Creatinine: 0.8 mg/dL (ref 0.7–1.3)
EGFR: 89 mL/min/{1.73_m2} — AB (ref >90)
GLUCOSE: 98 mg/dL (ref 70–140)
Potassium: 4.7 mEq/L (ref 3.5–5.1)
SODIUM: 138 meq/L (ref 136–145)
TOTAL PROTEIN: 6.5 g/dL (ref 6.4–8.3)

## 2017-03-24 LAB — CBC WITH DIFFERENTIAL/PLATELET
BASO%: 1.1 % (ref 0.0–2.0)
BASOS ABS: 0 10*3/uL (ref 0.0–0.1)
EOS ABS: 0 10*3/uL (ref 0.0–0.5)
EOS%: 0.4 % (ref 0.0–7.0)
HEMATOCRIT: 46.2 % (ref 38.4–49.9)
HEMOGLOBIN: 15.5 g/dL (ref 13.0–17.1)
LYMPH#: 1.1 10*3/uL (ref 0.9–3.3)
LYMPH%: 32.1 % (ref 14.0–49.0)
MCH: 31.9 pg (ref 27.2–33.4)
MCHC: 33.5 g/dL (ref 32.0–36.0)
MCV: 95.1 fL (ref 79.3–98.0)
MONO#: 0.1 10*3/uL (ref 0.1–0.9)
MONO%: 3.5 % (ref 0.0–14.0)
NEUT%: 62.9 % (ref 39.0–75.0)
NEUTROS ABS: 2.1 10*3/uL (ref 1.5–6.5)
Platelets: 101 10*3/uL — ABNORMAL LOW (ref 140–400)
RBC: 4.86 10*6/uL (ref 4.20–5.82)
RDW: 14 % (ref 11.0–14.6)
WBC: 3.4 10*3/uL — AB (ref 4.0–10.3)

## 2017-03-24 MED ORDER — HEPARIN SOD (PORK) LOCK FLUSH 100 UNIT/ML IV SOLN
500.0000 [IU] | Freq: Once | INTRAVENOUS | Status: AC | PRN
  Administered 2017-03-24: 500 [IU] via INTRAVENOUS
  Filled 2017-03-24: qty 5

## 2017-03-24 MED ORDER — SODIUM CHLORIDE 0.9% FLUSH
10.0000 mL | INTRAVENOUS | Status: DC | PRN
  Administered 2017-03-24: 10 mL via INTRAVENOUS
  Filled 2017-03-24: qty 10

## 2017-03-24 NOTE — Patient Instructions (Signed)
Implanted Port Home Guide An implanted port is a type of central line that is placed under the skin. Central lines are used to provide IV access when treatment or nutrition needs to be given through a person's veins. Implanted ports are used for long-term IV access. An implanted port may be placed because:  You need IV medicine that would be irritating to the small veins in your hands or arms.  You need long-term IV medicines, such as antibiotics.  You need IV nutrition for a long period.  You need frequent blood draws for lab tests.  You need dialysis.  Implanted ports are usually placed in the chest area, but they can also be placed in the upper arm, the abdomen, or the leg. An implanted port has two main parts:  Reservoir. The reservoir is round and will appear as a small, raised area under your skin. The reservoir is the part where a needle is inserted to give medicines or draw blood.  Catheter. The catheter is a thin, flexible tube that extends from the reservoir. The catheter is placed into a large vein. Medicine that is inserted into the reservoir goes into the catheter and then into the vein.  How will I care for my incision site? Do not get the incision site wet. Bathe or shower as directed by your health care provider. How is my port accessed? Special steps must be taken to access the port:  Before the port is accessed, a numbing cream can be placed on the skin. This helps numb the skin over the port site.  Your health care provider uses a sterile technique to access the port. ? Your health care provider must put on a mask and sterile gloves. ? The skin over your port is cleaned carefully with an antiseptic and allowed to dry. ? The port is gently pinched between sterile gloves, and a needle is inserted into the port.  Only "non-coring" port needles should be used to access the port. Once the port is accessed, a blood return should be checked. This helps ensure that the port  is in the vein and is not clogged.  If your port needs to remain accessed for a constant infusion, a clear (transparent) bandage will be placed over the needle site. The bandage and needle will need to be changed every week, or as directed by your health care provider.  Keep the bandage covering the needle clean and dry. Do not get it wet. Follow your health care provider's instructions on how to take a shower or bath while the port is accessed.  If your port does not need to stay accessed, no bandage is needed over the port.  What is flushing? Flushing helps keep the port from getting clogged. Follow your health care provider's instructions on how and when to flush the port. Ports are usually flushed with saline solution or a medicine called heparin. The need for flushing will depend on how the port is used.  If the port is used for intermittent medicines or blood draws, the port will need to be flushed: ? After medicines have been given. ? After blood has been drawn. ? As part of routine maintenance.  If a constant infusion is running, the port may not need to be flushed.  How long will my port stay implanted? The port can stay in for as long as your health care provider thinks it is needed. When it is time for the port to come out, surgery will be   done to remove it. The procedure is similar to the one performed when the port was put in. When should I seek immediate medical care? When you have an implanted port, you should seek immediate medical care if:  You notice a bad smell coming from the incision site.  You have swelling, redness, or drainage at the incision site.  You have more swelling or pain at the port site or the surrounding area.  You have a fever that is not controlled with medicine.  This information is not intended to replace advice given to you by your health care provider. Make sure you discuss any questions you have with your health care provider. Document  Released: 11/16/2005 Document Revised: 04/23/2016 Document Reviewed: 07/24/2013 Elsevier Interactive Patient Education  2017 [employer].  

## 2017-03-30 ENCOUNTER — Telehealth: Payer: Self-pay | Admitting: *Deleted

## 2017-03-30 ENCOUNTER — Encounter (HOSPITAL_COMMUNITY): Payer: Self-pay | Admitting: Emergency Medicine

## 2017-03-30 ENCOUNTER — Other Ambulatory Visit: Payer: Self-pay

## 2017-03-30 ENCOUNTER — Emergency Department (HOSPITAL_COMMUNITY): Payer: Medicare Other

## 2017-03-30 ENCOUNTER — Emergency Department (HOSPITAL_COMMUNITY)
Admission: EM | Admit: 2017-03-30 | Discharge: 2017-03-30 | Disposition: A | Discharge status: Home / Self Care | Payer: Medicare Other | Attending: Emergency Medicine | Admitting: Emergency Medicine

## 2017-03-30 DIAGNOSIS — I10 Essential (primary) hypertension: Secondary | ICD-10-CM | POA: Insufficient documentation

## 2017-03-30 DIAGNOSIS — Z96643 Presence of artificial hip joint, bilateral: Secondary | ICD-10-CM | POA: Diagnosis not present

## 2017-03-30 DIAGNOSIS — R0602 Shortness of breath: Secondary | ICD-10-CM | POA: Insufficient documentation

## 2017-03-30 DIAGNOSIS — Z87891 Personal history of nicotine dependence: Secondary | ICD-10-CM | POA: Insufficient documentation

## 2017-03-30 DIAGNOSIS — Z85118 Personal history of other malignant neoplasm of bronchus and lung: Secondary | ICD-10-CM | POA: Diagnosis not present

## 2017-03-30 LAB — URINALYSIS, ROUTINE W REFLEX MICROSCOPIC
Bilirubin Urine: NEGATIVE
Glucose, UA: NEGATIVE mg/dL
Ketones, ur: NEGATIVE mg/dL
Leukocytes, UA: NEGATIVE
Nitrite: NEGATIVE
PH: 5 (ref 5.0–8.0)
Protein, ur: NEGATIVE mg/dL
Specific Gravity, Urine: 1.029 (ref 1.005–1.030)

## 2017-03-30 LAB — COMPREHENSIVE METABOLIC PANEL
ALBUMIN: 3.6 g/dL (ref 3.5–5.0)
ALT: 61 U/L (ref 17–63)
AST: 39 U/L (ref 15–41)
Alkaline Phosphatase: 75 U/L (ref 38–126)
Anion gap: 8 (ref 5–15)
BUN: 15 mg/dL (ref 6–20)
CHLORIDE: 105 mmol/L (ref 101–111)
CO2: 24 mmol/L (ref 22–32)
Calcium: 8.6 mg/dL — ABNORMAL LOW (ref 8.9–10.3)
Creatinine, Ser: 1.07 mg/dL (ref 0.61–1.24)
GFR calc Af Amer: >60 mL/min (ref >60)
GLUCOSE: 94 mg/dL (ref 65–99)
POTASSIUM: 4.3 mmol/L (ref 3.5–5.1)
SODIUM: 137 mmol/L (ref 135–145)
Total Bilirubin: 0.6 mg/dL (ref 0.3–1.2)
Total Protein: 6.8 g/dL (ref 6.5–8.1)

## 2017-03-30 LAB — CBC
HEMATOCRIT: 41.9 % (ref 39.0–52.0)
Hemoglobin: 14.2 g/dL (ref 13.0–17.0)
MCH: 32.6 pg (ref 26.0–34.0)
MCHC: 33.9 g/dL (ref 30.0–36.0)
MCV: 96.1 fL (ref 78.0–100.0)
Platelets: 104 10*3/uL — ABNORMAL LOW (ref 150–400)
RBC: 4.36 MIL/uL (ref 4.22–5.81)
RDW: 14.1 % (ref 11.5–15.5)
WBC: 2.7 10*3/uL — AB (ref 4.0–10.5)

## 2017-03-30 LAB — I-STAT TROPONIN, ED: Troponin i, poc: 0 ng/mL (ref 0.00–0.08)

## 2017-03-30 MED ORDER — IOPAMIDOL (ISOVUE-370) INJECTION 76%
100.0000 mL | Freq: Once | INTRAVENOUS | Status: AC | PRN
  Administered 2017-03-30: 100 mL via INTRAVENOUS

## 2017-03-30 MED ORDER — IOPAMIDOL (ISOVUE-370) INJECTION 76%
INTRAVENOUS | Status: AC
  Filled 2017-03-30: qty 100

## 2017-03-30 NOTE — ED Triage Notes (Signed)
Pt reports shortness of breath , Hx PE x 2. sts he feels same as when he had PE. Denies chest pain . Ca pt, last chemo 2 weeks ago. Alert and oriented x 4 . Ambulatory to triage.

## 2017-03-30 NOTE — ED Provider Notes (Addendum)
Medical screening examination/treatment/procedure(s) were conducted as a shared visit with non-physician practitioner(s) and myself.  I personally evaluated the patient during the encounter.   EKG Interpretation None       Results for orders placed or performed during the hospital encounter of 03/30/17  Comprehensive metabolic panel  Result Value Ref Range   Sodium 137 135 - 145 mmol/L   Potassium 4.3 3.5 - 5.1 mmol/L   Chloride 105 101 - 111 mmol/L   CO2 24 22 - 32 mmol/L   Glucose, Bld 94 65 - 99 mg/dL   BUN 15 6 - 20 mg/dL   Creatinine, Ser 1.07 0.61 - 1.24 mg/dL   Calcium 8.6 (L) 8.9 - 10.3 mg/dL   Total Protein 6.8 6.5 - 8.1 g/dL   Albumin 3.6 3.5 - 5.0 g/dL   AST 39 15 - 41 U/L   ALT 61 17 - 63 U/L   Alkaline Phosphatase 75 38 - 126 U/L   Total Bilirubin 0.6 0.3 - 1.2 mg/dL   GFR calc non Af Amer >60 >60 mL/min   GFR calc Af Amer >60 >60 mL/min   Anion gap 8 5 - 15  CBC  Result Value Ref Range   WBC 2.7 (L) 4.0 - 10.5 K/uL   RBC 4.36 4.22 - 5.81 MIL/uL   Hemoglobin 14.2 13.0 - 17.0 g/dL   HCT 41.9 39.0 - 52.0 %   MCV 96.1 78.0 - 100.0 fL   MCH 32.6 26.0 - 34.0 pg   MCHC 33.9 30.0 - 36.0 g/dL   RDW 14.1 11.5 - 15.5 %   Platelets 104 (L) 150 - 400 K/uL  Urinalysis, Routine w reflex microscopic  Result Value Ref Range   Color, Urine YELLOW YELLOW   APPearance CLEAR CLEAR   Specific Gravity, Urine 1.029 1.005 - 1.030   pH 5.0 5.0 - 8.0   Glucose, UA NEGATIVE NEGATIVE mg/dL   Hgb urine dipstick SMALL (A) NEGATIVE   Bilirubin Urine NEGATIVE NEGATIVE   Ketones, ur NEGATIVE NEGATIVE mg/dL   Protein, ur NEGATIVE NEGATIVE mg/dL   Nitrite NEGATIVE NEGATIVE   Leukocytes, UA NEGATIVE NEGATIVE   RBC / HPF 0-5 0 - 5 RBC/hpf   WBC, UA 0-5 0 - 5 WBC/hpf   Bacteria, UA RARE (A) NONE SEEN   Squamous Epithelial / LPF 0-5 (A) NONE SEEN   Mucous PRESENT   I-stat troponin, ED  (not at [facility])  Result Value Ref Range   Troponin i, poc 0.00 0.00 - 0.08 ng/mL   Comment 3            Dg Chest 2 View  Result Date: 03/30/2017 CLINICAL DATA:  History of small cell lung cancer. Shortness of breath EXAM: CHEST  2 VIEW COMPARISON:  Chest CT 02/15/2017 FINDINGS: Normal heart size and stable mediastinal contours. There is left hilar adenopathy by CT. Porta catheter on the right with tip at the SVC level. Known left-sided lung nodule. There is no edema, consolidation, effusion, or pneumothorax. IMPRESSION: 1. No acute finding. 2. Known left lung nodule. Electronically Signed   By: Monte Fantasia M.D.   On: 03/30/2017 19:42   Ct Angio Chest W/cm &/or Wo Cm  Result Date: 03/30/2017 CLINICAL DATA:  Shortness of breath. Chemotherapy 2 weeks ago for lung cancer. EXAM: CT ANGIOGRAPHY CHEST WITH CONTRAST TECHNIQUE: Multidetector CT imaging of the chest was performed using the standard protocol during bolus administration of intravenous contrast. Multiplanar CT image reconstructions and MIPs were obtained to evaluate the  vascular anatomy. CONTRAST:  84 mL Isovue 370 IV COMPARISON:  02/15/2017, 12/09/2016 and PET-CT 10/05/2016 FINDINGS: Cardiovascular: Heart is normal size. Thoracic aorta is within normal. No evidence of pulmonary emboli. Mediastinum/Nodes: Interval decrease in size of left hilar adenopathy measuring 1 cm by short axis (previously 1.4 cm). No mediastinal or right hilar adenopathy. There is an air-fluid level over the proximal to mid esophagus likely due to dysmotility or reflux. Lungs/Pleura: Lungs are adequately inflated. There are mild emphysematous changes. There has been slight interval decrease in size of the subpleural nodule over the left lower lobe measuring 0.8 x 1.2 cm (previously 0.8 x 2.1 cm). The previously seen adjacent 7 mm nodule in the left lower lobe is not visualized. No new nodules identified. No consolidation or effusion. Airways are within normal. Upper Abdomen: Stable 1.4 cm hypodensity over the left lobe of the liver not hypermetabolic on previous PET-CT.  Stable right renal cysts. Musculoskeletal: Mild to moderate degenerate change of the spine. Review of the MIP images confirms the above findings. IMPRESSION: No evidence of pulmonary embolism. No acute cardiopulmonary disease. Interval decrease in size of left lower lobe subpleural nodule measuring 0.8 x 1.2 cm (previously 0.8 x 2.1 cm) previously seen adjacent 7 mm left lower lobe nodule resolved. Decreased left hilar adenopathy. Mild emphysematous disease. Air-fluid level over the mid esophagus likely due to dysmotility or reflux. Stable 1.4 cm left liver hypodensity.  Right renal cysts. Electronically Signed   By: Marin Olp M.D.   On: 03/30/2017 21:17     Patient seen by me along with the physician assistant. Patient's had the complaint of exertional shortness of breath for 3 days. Patient's had a history of PE 2 in the past. Most recently in the fall. Patient is on blood thinners. Patient denies any chest pain. Patient does have a history of cancer and had last chemotherapy 2 weeks ago. Patient is alert and oriented. Patient at rest has no complaints. Patient has a history of lung cancer.  Patient's heart regular lungs are clear bilaterally. Room air sats are in the upper 90s.  CT angios negative labs without any significant findings. CT scan should be specific show no evidence pulmonary embolism. No other acute findings.   Will ambulate patient with pulse ox and see if there is any drop in oxygen saturation. If that is negative and can follow-up with his doctors.   Fredia Sorrow, MD 03/30/17 2245   ED ECG REPORT   Date: 03/30/2017  Rate: 81  Rhythm: normal sinus rhythm  QRS Axis: normal  Intervals: normal  ST/T Wave abnormalities: normal  Conduction Disutrbances:none  Narrative Interpretation:   Old EKG Reviewed: none available  I have personally reviewed the EKG tracing and agree with the computerized printout as noted.  Has a wandering baseline. No acute findings.     Fredia Sorrow, MD 03/30/17 [phone]

## 2017-03-30 NOTE — ED Notes (Signed)
Patient transported to CT 

## 2017-03-30 NOTE — ED Provider Notes (Signed)
[facility] DEPT Provider Note   CSN: [id number] Arrival date & time: 03/30/17  1733     History   Chief Complaint Chief Complaint  Patient presents with  . Shortness of Breath    HPI Nicholas Hale is a 71 y.o. male with PMHx of adenocarcinoma of lung, PE, DVT, HLD, AAA, Presents today with shortness of breath for 3 days. He reports has been gradually worsening. He reports associated cough, that he thinks is a "tickle" to his throat rather than from his lungs. He reports dyspnea while lawn mowing initially and now from walking from one side of the house to the other. Patient denies chest pain, fever, chills, nausea, vomiting, diarrhea. He denies leg swelling. He denies hormone therapy. He denies hemoptysis. He denies SOB at rest. Pt has not tried anything for his symptoms. He reports he is a cancer patient and last chemotherapy was 2 weeks ago. He states his symptoms feel similar to previous PE. He admits to COPD and being a former smoker. He denies hx of asthma. He reports taking xarelto daily. He denies recent travel. He denies hx of CHF.   The history is provided by the patient. No language interpreter was used.  Shortness of Breath  Associated symptoms include cough. Pertinent negatives include no fever, no chest pain, no vomiting and no abdominal pain.    Past Medical History:  Diagnosis Date  . Abdominal aortic aneurysm ([place])   . Arthritis    "hips; lower spine" (05/24/2014)  . Chronic fatigue 10/05/2016  . Encounter for antineoplastic chemotherapy 02/17/2017  . Hyperlipidemia   . Hypertension   . Lung cancer ([place]) 09/25/2016  . PE (pulmonary embolism) 9/15  . Pericardial effusion 09/20/2016   Malignant effusion s/p pericardial drain  . Personal history of DVT (deep vein thrombosis) 9/15    Patient Active Problem List   Diagnosis Date Noted  . Port catheter in place 03/03/2017  . Encounter for antineoplastic chemotherapy 02/17/2017  . Adenocarcinoma of left lung,  stage 4 ([place]) 10/05/2016  . Goals of care, counseling/discussion 10/05/2016  . Chronic fatigue 10/05/2016  . Lung cancer ([place]) 09/25/2016  . Acute deep vein thrombosis (DVT) of proximal vein of both lower extremities ([facility])   . Cardiac tamponade   . Elevated troponin 09/20/2016  . 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 Ventricular tachycardia ([place]) 09/19/2016  . Renal cyst, right 09/19/2016  . Liver lesion 09/19/2016  . Nonsustained ventricular tachycardia ([place])   . History of tobacco abuse 01/28/2016  . Primary osteoarthritis of right hip 05/10/2015  . 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 Arthritis of left hip 05/23/2014  . Hypertension 05/15/2014  . Hyperlipidemia 05/15/2014    Past Surgical History:  Procedure Laterality Date  . ANTERIOR FUSION CERVICAL SPINE  ~ 2005  . APPENDECTOMY  1978  . CARDIAC CATHETERIZATION N/A 09/21/2016   Procedure: Pericardiocentesis;  Surgeon: Jettie Booze, MD;  Location: [facility];  Service: Cardiovascular;  Laterality: N/A;  . COLONOSCOPY    . HEMIARTHROPLASTY SHOULDER FRACTURE Left 12/2008  . INGUINAL HERNIA REPAIR Bilateral 1992  . IR GENERIC HISTORICAL  02/01/2017   IR FLUORO GUIDE PORT INSERTION RIGHT 02/01/2017 Arne Cleveland, MD [facility]  . IR GENERIC HISTORICAL  02/01/2017   IR US GUIDE VASC ACCESS RIGHT 02/01/2017 Arne Cleveland, MD [facility]  . JOINT REPLACEMENT    . PERICARDIAL FLUID DRAINAGE  09/21/2016  . TOTAL HIP ARTHROPLASTY Left 05/23/2014   Procedure: TOTAL HIP ARTHROPLASTY;  Surgeon: Kerin Salen, MD;  Location: [facility];  Service: Orthopedics;  Laterality: Left;  . TOTAL HIP ARTHROPLASTY Right 05/15/2015   Procedure: TOTAL HIP ARTHROPLASTY;  Surgeon: Frederik Pear, MD;  Location: [facility];  Service: Orthopedics;  Laterality: Right;       Home Medications    Prior to Admission medications   Medication Sig Start Date End Date Taking? Authorizing Provider  atorvastatin (LIPITOR) 20 MG  tablet Take 1 tablet (20 mg total) by mouth daily at 6 PM. 10/02/16  Yes Nelva Bush, MD  dexamethasone (DECADRON) 4 MG tablet 4 mg by mouth twice a day the day before, day of and day after the chemotherapy every 3 weeks. 02/17/17  Yes [name], MD  diphenhydrAMINE (BENADRYL) 25 mg capsule Take 25 mg by mouth every 6 (six) hours as needed.   Yes Historical Provider, MD  folic acid (FOLVITE) 1 MG tablet Take 1 tablet (1 mg total) by mouth daily. 02/17/17  Yes [name], MD  lidocaine-prilocaine (EMLA) cream Apply 1 application topically as needed. 01/27/17  Yes [name], MD  magic mouthwash SOLN Take 5 mLs by mouth 4 (four) times daily. Hydrocortisone 60 mg ,. Nystatin 30 ml Benadryl 12.5 mg/5 ml. Mix to 240 ml 03/11/17  Yes [name], MD  prochlorperazine (COMPAZINE) 10 MG tablet Take 1 tablet (10 mg total) by mouth every 6 (six) hours as needed for nausea or vomiting. 10/05/16  Yes [name], MD  XARELTO 20 MG TABS tablet TAKE 1 TABLET BY MOUTH EVERY DAY 03/23/17  Yes Collene Gobble, MD    Family History Family History  Problem Relation Age of Onset  . Clotting disorder Mother   . Diabetes Mellitus I Mother   . Prostate cancer Father     Social History Social History  Substance Use Topics  . Smoking status: Former Smoker    Packs/day: 0.50    Years: 40.00    Types: Cigarettes    Quit date: 05/05/2009  . Smokeless tobacco: Never Used  . Alcohol use No     Allergies   Patient has no known allergies.   Review of Systems Review of Systems  Constitutional: Negative for chills and fever.  HENT: Negative for congestion.   Respiratory: Positive for cough and shortness of breath.   Cardiovascular: Negative for chest pain.  Gastrointestinal: Negative for abdominal pain, diarrhea, nausea and vomiting.  Genitourinary: Negative for difficulty urinating.  Skin: Negative for wound.  All other systems reviewed and are negative.    Physical Exam Updated  Vital Signs BP (!) 145/79   Pulse 69   Temp 97.4 F (36.3 C) (Oral)   Resp 18   SpO2 100%   Physical Exam  Constitutional: He appears well-developed and well-nourished. No distress.  Well appearing.  HENT:  Head: Normocephalic and atraumatic.  Nose: Nose normal.  Eyes: Conjunctivae and EOM are normal. Pupils are equal, round, and reactive to light.  Neck: Normal range of motion. No JVD present. No tracheal deviation present.  Cardiovascular: Normal rate, normal heart sounds and intact distal pulses.   No murmur heard. 2+ distal pulses.   Pulmonary/Chest: Effort normal and breath sounds normal. No respiratory distress. He has no wheezes. He has no rales.  Normal work of breathing. No respiratory distress noted.   Abdominal: Soft. Bowel sounds are normal. There is no tenderness. There is no rebound and no guarding.  Musculoskeletal: Normal range of motion.  Normal for range of motion of both legs. Negative  Homans sign.  Neurological: He is alert.  Sensation intact to all 4 extremities. Muscle strength 5/5.   Skin: Skin is warm. Capillary refill takes 2 to 3 seconds.  No pitting edema noted. No leg swelling noted. No redness or discoloration of legs bilaterally.  Psychiatric: He has a normal mood and affect. His behavior is normal.  Nursing note and vitals reviewed.    ED Treatments / Results  Labs (all labs ordered are listed, but only abnormal results are displayed) Labs Reviewed  COMPREHENSIVE METABOLIC PANEL - Abnormal; Notable for the following:       Result Value   Calcium 8.6 (*)    All other components within normal limits  CBC - Abnormal; Notable for the following:    WBC 2.7 (*)    Platelets 104 (*)    All other components within normal limits  URINALYSIS, ROUTINE W REFLEX MICROSCOPIC - Abnormal; Notable for the following:    Hgb urine dipstick SMALL (*)    Bacteria, UA RARE (*)    Squamous Epithelial / LPF 0-5 (*)    All other components within normal limits    I-STAT TROPOININ, ED    EKG  EKG Interpretation None       Radiology Dg Chest 2 View  Result Date: 03/30/2017 CLINICAL DATA:  History of small cell lung cancer. Shortness of breath EXAM: CHEST  2 VIEW COMPARISON:  Chest CT 02/15/2017 FINDINGS: Normal heart size and stable mediastinal contours. There is left hilar adenopathy by CT. Porta catheter on the right with tip at the SVC level. Known left-sided lung nodule. There is no edema, consolidation, effusion, or pneumothorax. IMPRESSION: 1. No acute finding. 2. Known left lung nodule. Electronically Signed   By: Monte Fantasia M.D.   On: 03/30/2017 19:42   Ct Angio Chest W/cm &/or Wo Cm  Result Date: 03/30/2017 CLINICAL DATA:  Shortness of breath. Chemotherapy 2 weeks ago for lung cancer. EXAM: CT ANGIOGRAPHY CHEST WITH CONTRAST TECHNIQUE: Multidetector CT imaging of the chest was performed using the standard protocol during bolus administration of intravenous contrast. Multiplanar CT image reconstructions and MIPs were obtained to evaluate the vascular anatomy. CONTRAST:  84 mL Isovue 370 IV COMPARISON:  02/15/2017, 12/09/2016 and PET-CT 10/05/2016 FINDINGS: Cardiovascular: Heart is normal size. Thoracic aorta is within normal. No evidence of pulmonary emboli. Mediastinum/Nodes: Interval decrease in size of left hilar adenopathy measuring 1 cm by short axis (previously 1.4 cm). No mediastinal or right hilar adenopathy. There is an air-fluid level over the proximal to mid esophagus likely due to dysmotility or reflux. Lungs/Pleura: Lungs are adequately inflated. There are mild emphysematous changes. There has been slight interval decrease in size of the subpleural nodule over the left lower lobe measuring 0.8 x 1.2 cm (previously 0.8 x 2.1 cm). The previously seen adjacent 7 mm nodule in the left lower lobe is not visualized. No new nodules identified. No consolidation or effusion. Airways are within normal. Upper Abdomen: Stable 1.4 cm hypodensity  over the left lobe of the liver not hypermetabolic on previous PET-CT. Stable right renal cysts. Musculoskeletal: Mild to moderate degenerate change of the spine. Review of the MIP images confirms the above findings. IMPRESSION: No evidence of pulmonary embolism. No acute cardiopulmonary disease. Interval decrease in size of left lower lobe subpleural nodule measuring 0.8 x 1.2 cm (previously 0.8 x 2.1 cm) previously seen adjacent 7 mm left lower lobe nodule resolved. Decreased left hilar adenopathy. Mild emphysematous disease. Air-fluid level over the mid esophagus likely  due to dysmotility or reflux. Stable 1.4 cm left liver hypodensity.  Right renal cysts. Electronically Signed   By: Marin Olp M.D.   On: 03/30/2017 21:17    Procedures Procedures (including critical care time)  Medications Ordered in ED Medications  iopamidol (ISOVUE-370) 76 % injection (not administered)  iopamidol (ISOVUE-370) 76 % injection 100 mL (100 mLs Intravenous Contrast Given 03/30/17 2047)     Initial Impression / Assessment and Plan / ED Course  I have reviewed the triage vital signs and the nursing notes.  Pertinent labs & imaging results that were available during my care of the patient were reviewed by me and considered in my medical decision making (see chart for details).     Patient here with complaints of shortness of breath on exertion. He has history of PE and history of adenocarcinoma of his left lung. CTA was negative for PE or acute finding. Patient is in no apparent distress, afebrile, hemodynamically stable. Normal work of breathing. No respiratory distress noted. Patient able to speak in full sentences without difficulty. Heart and lung sounds are clear. Abdomen soft and nontender. CT is negative. Chest x-ray is negative for any acute process. Lab work is reassuring. EKG without acute abnormality. Patient ambulated in ED with pulse ox with no difficulty keeping sats above 96% throughout walk.  Doubt  PE, pneumonia, heart failure or any other acute process. Symptoms likely secondary to his known lung Ca. I feel safe for discharge at this time. Patient is still in no distress, afebrile, hemodynamically stable. Patient is to follow up with his primary care provider tomorrow regarding this visit. Reasons to immediately return to the ED discussed. Pt also seen and evaluated by Dr. Rogene Houston who agreed with assessment and plan.    Final Clinical Impressions(s) / ED Diagnoses   Final diagnoses:  Shortness of breath    New Prescriptions New Prescriptions   No medications on file     [place], [place] 03/30/17 2339    Fredia Sorrow, MD 04/01/17 1311

## 2017-03-30 NOTE — Discharge Instructions (Signed)
Please follow up with your primary care provider tomorrow regarding today's visit.  CTA result:  IMPRESSION:  No evidence of pulmonary embolism. No acute cardiopulmonary disease.    Contact a health care provider if: Your condition does not improve as soon as expected. You have a hard time doing your normal activities, even after you rest. You have new symptoms. Get help right away if: Your shortness of breath gets worse. You have shortness of breath when you are resting. You feel light-headed or you faint. You have a cough that is not controlled with medicines. You cough up blood. You have pain with breathing. You have pain in your chest, arms, shoulders, or abdomen. You have a fever. You cannot walk up stairs or exercise the way that you normally do.

## 2017-03-30 NOTE — Telephone Encounter (Signed)
Pt called [facility] "I have mouth sores" Returned call to pt home and mobile # unable to reach [facility] for pt to call back with additional information.

## 2017-03-30 NOTE — ED Notes (Signed)
Pt ambulated 100 ft and maintained O2 sats above 96.

## 2017-03-31 ENCOUNTER — Other Ambulatory Visit: Payer: Self-pay | Admitting: Medical Oncology

## 2017-03-31 ENCOUNTER — Ambulatory Visit (HOSPITAL_BASED_OUTPATIENT_CLINIC_OR_DEPARTMENT_OTHER): Payer: Medicare Other

## 2017-03-31 ENCOUNTER — Other Ambulatory Visit: Payer: Self-pay | Admitting: Internal Medicine

## 2017-03-31 ENCOUNTER — Telehealth: Payer: Self-pay | Admitting: Medical Oncology

## 2017-03-31 ENCOUNTER — Ambulatory Visit: Payer: Medicare Other

## 2017-03-31 ENCOUNTER — Other Ambulatory Visit: Payer: Medicare Other

## 2017-03-31 ENCOUNTER — Other Ambulatory Visit (HOSPITAL_BASED_OUTPATIENT_CLINIC_OR_DEPARTMENT_OTHER): Payer: Medicare Other

## 2017-03-31 ENCOUNTER — Ambulatory Visit: Payer: Medicare Other | Admitting: Internal Medicine

## 2017-03-31 ENCOUNTER — Encounter: Payer: Self-pay | Admitting: Internal Medicine

## 2017-03-31 VITALS — BP 138/82 | HR 76 | Temp 98.3°F | Resp 17

## 2017-03-31 DIAGNOSIS — C3492 Malignant neoplasm of unspecified part of left bronchus or lung: Secondary | ICD-10-CM

## 2017-03-31 DIAGNOSIS — D701 Agranulocytosis secondary to cancer chemotherapy: Secondary | ICD-10-CM

## 2017-03-31 DIAGNOSIS — Z5189 Encounter for other specified aftercare: Secondary | ICD-10-CM

## 2017-03-31 DIAGNOSIS — Z452 Encounter for adjustment and management of vascular access device: Secondary | ICD-10-CM

## 2017-03-31 DIAGNOSIS — D702 Other drug-induced agranulocytosis: Secondary | ICD-10-CM

## 2017-03-31 DIAGNOSIS — T451X5A Adverse effect of antineoplastic and immunosuppressive drugs, initial encounter: Principal | ICD-10-CM

## 2017-03-31 DIAGNOSIS — Z5112 Encounter for antineoplastic immunotherapy: Secondary | ICD-10-CM

## 2017-03-31 DIAGNOSIS — R5382 Chronic fatigue, unspecified: Secondary | ICD-10-CM

## 2017-03-31 DIAGNOSIS — Z5111 Encounter for antineoplastic chemotherapy: Secondary | ICD-10-CM

## 2017-03-31 DIAGNOSIS — C3432 Malignant neoplasm of lower lobe, left bronchus or lung: Secondary | ICD-10-CM | POA: Diagnosis present

## 2017-03-31 DIAGNOSIS — Z95828 Presence of other vascular implants and grafts: Secondary | ICD-10-CM

## 2017-03-31 HISTORY — DX: Agranulocytosis secondary to cancer chemotherapy: D70.1

## 2017-03-31 HISTORY — DX: Adverse effect of antineoplastic and immunosuppressive drugs, initial encounter: T45.1X5A

## 2017-03-31 LAB — CBC WITH DIFFERENTIAL/PLATELET
BASO%: 0.5 % (ref 0.0–2.0)
Basophils Absolute: 0 10*3/uL (ref 0.0–0.1)
EOS%: 1.8 % (ref 0.0–7.0)
Eosinophils Absolute: 0 10*3/uL (ref 0.0–0.5)
HCT: 41.3 % (ref 38.4–49.9)
HGB: 14.1 g/dL (ref 13.0–17.1)
LYMPH%: 35.2 % (ref 14.0–49.0)
MCH: 32.3 pg (ref 27.2–33.4)
MCHC: 34.1 g/dL (ref 32.0–36.0)
MCV: 94.8 fL (ref 79.3–98.0)
MONO#: 0.5 10*3/uL (ref 0.1–0.9)
MONO%: 23.6 % — AB (ref 0.0–14.0)
NEUT#: 0.9 10*3/uL — ABNORMAL LOW (ref 1.5–6.5)
NEUT%: 38.9 % — AB (ref 39.0–75.0)
Platelets: 123 10*3/uL — ABNORMAL LOW (ref 140–400)
RBC: 4.35 10*6/uL (ref 4.20–5.82)
RDW: 14 % (ref 11.0–14.6)
WBC: 2.3 10*3/uL — ABNORMAL LOW (ref 4.0–10.3)
lymph#: 0.8 10*3/uL — ABNORMAL LOW (ref 0.9–3.3)

## 2017-03-31 MED ORDER — TBO-FILGRASTIM 300 MCG/0.5ML ~~LOC~~ SOSY
300.0000 ug | PREFILLED_SYRINGE | Freq: Once | SUBCUTANEOUS | Status: DC
  Filled 2017-03-31: qty 0.5

## 2017-03-31 MED ORDER — HEPARIN SOD (PORK) LOCK FLUSH 100 UNIT/ML IV SOLN
500.0000 [IU] | Freq: Once | INTRAVENOUS | Status: AC | PRN
  Administered 2017-03-31: 500 [IU] via INTRAVENOUS
  Filled 2017-03-31: qty 5

## 2017-03-31 MED ORDER — SODIUM CHLORIDE 0.9% FLUSH
10.0000 mL | INTRAVENOUS | Status: DC | PRN
  Administered 2017-03-31: 10 mL via INTRAVENOUS
  Filled 2017-03-31: qty 10

## 2017-03-31 MED ORDER — TBO-FILGRASTIM 300 MCG/0.5ML ~~LOC~~ SOSY
300.0000 ug | PREFILLED_SYRINGE | Freq: Once | SUBCUTANEOUS | Status: AC
  Administered 2017-03-31: 300 ug via SUBCUTANEOUS
  Filled 2017-03-31: qty 0.5

## 2017-03-31 NOTE — Telephone Encounter (Signed)
Faxed recent notes to dentist.

## 2017-03-31 NOTE — Patient Instructions (Signed)
Tbo-Filgrastim injection What is this medicine? TBO-FILGRASTIM (T B O fil GRA stim) is a granulocyte colony-stimulating factor that stimulates the growth of neutrophils, a type of white blood cell important in the body's fight against infection. It is used to reduce the incidence of fever and infection in patients with certain types of cancer who are receiving chemotherapy that affects the bone marrow. This medicine may be used for other purposes; ask your health care provider or pharmacist if you have questions. COMMON BRAND NAME(S): Granix What should I tell my health care provider before I take this medicine? They need to know if you have any of these conditions: -bone scan or tests planned -kidney disease -sickle cell anemia -an unusual or allergic reaction to tbo-filgrastim, filgrastim, pegfilgrastim, other medicines, foods, dyes, or preservatives -pregnant or trying to get pregnant -breast-feeding How should I use this medicine? This medicine is for injection under the skin. If you get this medicine at home, you will be taught how to prepare and give this medicine. Refer to the Instructions for Use that come with your medication packaging. Use exactly as directed. Take your medicine at regular intervals. Do not take your medicine more often than directed. It is important that you put your used needles and syringes in a special sharps container. Do not put them in a trash can. If you do not have a sharps container, call your pharmacist or healthcare provider to get one. Talk to your pediatrician regarding the use of this medicine in children. Special care may be needed. Overdosage: If you think you have taken too much of this medicine contact a poison control center or emergency room at once. NOTE: This medicine is only for you. Do not share this medicine with others. What if I miss a dose? It is important not to miss your dose. Call your doctor or health care professional if you miss a  dose. What may interact with this medicine? This medicine may interact with the following medications: -medicines that may cause a release of neutrophils, such as lithium This list may not describe all possible interactions. Give your health care provider a list of all the medicines, herbs, non-prescription drugs, or dietary supplements you use. Also tell them if you smoke, drink alcohol, or use illegal drugs. Some items may interact with your medicine. What should I watch for while using this medicine? You may need blood work done while you are taking this medicine. What side effects may I notice from receiving this medicine? Side effects that you should report to your doctor or health care professional as soon as possible: -allergic reactions like skin rash, itching or hives, swelling of the face, lips, or tongue -blood in the urine -dark urine -dizziness -fast heartbeat -feeling faint -shortness of breath or breathing problems -signs and symptoms of infection like fever or chills; cough; or sore throat -signs and symptoms of kidney injury like trouble passing urine or change in the amount of urine -stomach or side pain, or pain at the shoulder -sweating -swelling of the legs, ankles, or abdomen -tiredness Side effects that usually do not require medical attention (report to your doctor or health care professional if they continue or are bothersome): -bone pain -headache -muscle pain -vomiting This list may not describe all possible side effects. Call your doctor for medical advice about side effects. You may report side effects to FDA at 1-800-FDA-1088. Where should I keep my medicine? Keep out of the reach of children. Store in a refrigerator between   2 and 8 degrees C (36 and 46 degrees F). Keep in carton to protect from light. Throw away this medicine if it is left out of the refrigerator for more than 5 consecutive days. Throw away any unused medicine after the expiration  date. NOTE: This sheet is a summary. It may not cover all possible information. If you have questions about this medicine, talk to your doctor, pharmacist, or health care provider.  2018 Elsevier/Gold Standard (2016-01-06 19:07:04)  

## 2017-03-31 NOTE — Telephone Encounter (Signed)
I called Dr Areta Haber office and gave update about pt neutropenia and that he is getting Granix today to stimulate his immune system. I faxed order to proceed with taking care of mouth ulcer.

## 2017-03-31 NOTE — Telephone Encounter (Addendum)
Left message to cancel labs today , but according to wife pt already on the way because he is having pian in lower left jaw and he thinks he has an infection . Dentist saw him Monday and did not want to drain it until cancer center says it is okay to drain it. He cannot get his partial dentures because of pain. Pt wants someone to look at it. PEr [name] pt needs cbc /diff today because diff not done in hospital. Pt notified to register.

## 2017-03-31 NOTE — Patient Instructions (Signed)
Implanted Port Home Guide An implanted port is a type of central line that is placed under the skin. Central lines are used to provide IV access when treatment or nutrition needs to be given through a person's veins. Implanted ports are used for long-term IV access. An implanted port may be placed because:  You need IV medicine that would be irritating to the small veins in your hands or arms.  You need long-term IV medicines, such as antibiotics.  You need IV nutrition for a long period.  You need frequent blood draws for lab tests.  You need dialysis.  Implanted ports are usually placed in the chest area, but they can also be placed in the upper arm, the abdomen, or the leg. An implanted port has two main parts:  Reservoir. The reservoir is round and will appear as a small, raised area under your skin. The reservoir is the part where a needle is inserted to give medicines or draw blood.  Catheter. The catheter is a thin, flexible tube that extends from the reservoir. The catheter is placed into a large vein. Medicine that is inserted into the reservoir goes into the catheter and then into the vein.  How will I care for my incision site? Do not get the incision site wet. Bathe or shower as directed by your health care provider. How is my port accessed? Special steps must be taken to access the port:  Before the port is accessed, a numbing cream can be placed on the skin. This helps numb the skin over the port site.  Your health care provider uses a sterile technique to access the port. ? Your health care provider must put on a mask and sterile gloves. ? The skin over your port is cleaned carefully with an antiseptic and allowed to dry. ? The port is gently pinched between sterile gloves, and a needle is inserted into the port.  Only "non-coring" port needles should be used to access the port. Once the port is accessed, a blood return should be checked. This helps ensure that the port  is in the vein and is not clogged.  If your port needs to remain accessed for a constant infusion, a clear (transparent) bandage will be placed over the needle site. The bandage and needle will need to be changed every week, or as directed by your health care provider.  Keep the bandage covering the needle clean and dry. Do not get it wet. Follow your health care provider's instructions on how to take a shower or bath while the port is accessed.  If your port does not need to stay accessed, no bandage is needed over the port.  What is flushing? Flushing helps keep the port from getting clogged. Follow your health care provider's instructions on how and when to flush the port. Ports are usually flushed with saline solution or a medicine called heparin. The need for flushing will depend on how the port is used.  If the port is used for intermittent medicines or blood draws, the port will need to be flushed: ? After medicines have been given. ? After blood has been drawn. ? As part of routine maintenance.  If a constant infusion is running, the port may not need to be flushed.  How long will my port stay implanted? The port can stay in for as long as your health care provider thinks it is needed. When it is time for the port to come out, surgery will be   done to remove it. The procedure is similar to the one performed when the port was put in. When should I seek immediate medical care? When you have an implanted port, you should seek immediate medical care if:  You notice a bad smell coming from the incision site.  You have swelling, redness, or drainage at the incision site.  You have more swelling or pain at the port site or the surrounding area.  You have a fever that is not controlled with medicine.  This information is not intended to replace advice given to you by your health care provider. Make sure you discuss any questions you have with your health care provider. Document  Released: 11/16/2005 Document Revised: 04/23/2016 Document Reviewed: 07/24/2013 Elsevier Interactive Patient Education  2017 [employer].  

## 2017-04-05 ENCOUNTER — Ambulatory Visit (INDEPENDENT_AMBULATORY_CARE_PROVIDER_SITE_OTHER): Payer: Medicare Other | Admitting: Emergency Medicine

## 2017-04-05 ENCOUNTER — Other Ambulatory Visit: Payer: Self-pay | Admitting: Medical Oncology

## 2017-04-05 ENCOUNTER — Encounter: Payer: Self-pay | Admitting: Emergency Medicine

## 2017-04-05 DIAGNOSIS — R0609 Other forms of dyspnea: Secondary | ICD-10-CM | POA: Diagnosis not present

## 2017-04-05 DIAGNOSIS — I2699 Other pulmonary embolism without acute cor pulmonale: Secondary | ICD-10-CM | POA: Diagnosis not present

## 2017-04-05 DIAGNOSIS — C3492 Malignant neoplasm of unspecified part of left bronchus or lung: Secondary | ICD-10-CM | POA: Diagnosis not present

## 2017-04-05 DIAGNOSIS — K121 Other forms of stomatitis: Secondary | ICD-10-CM

## 2017-04-05 MED ORDER — TIOTROPIUM BROMIDE MONOHYDRATE 2.5 MCG/ACT IN AERS: 2.0000 | INHALATION_SPRAY | Freq: Every day | RESPIRATORY_TRACT | 0 refills | Status: DC

## 2017-04-05 MED ORDER — MAGIC MOUTHWASH: 5.0000 mL | Freq: Four times a day (QID) | ORAL | 1 refills | Status: DC

## 2017-04-05 NOTE — Assessment & Plan Note (Signed)
Treated initially with immunotherapy, now on conventional chemotherapy. Complicated by stomatitis, fatigue. Suspect that much of his dyspnea is also related.

## 2017-04-05 NOTE — Patient Instructions (Addendum)
We will do a trial on Spiriva to see if it helps your shortness of breath. Take 2 puffs once a day.  Call our office to let us know if you have benefited. If so then we will order this medication for you through your pharmacy. Continue xarelto as you are taking it.  Continue to follow with Dr. [name] as planned.  Follow with Dr Lamonte Sakai in 3 months or sooner if you have any problems.

## 2017-04-05 NOTE — Assessment & Plan Note (Signed)
Planning to continue lifelong anticoagulation. Tolerating Xarelto. We'll continue this.

## 2017-04-05 NOTE — Progress Notes (Signed)
Subjective:    Patient ID: Nicholas Hale, male    DOB: 30-May-1946, 71 y.o.   MRN: [id number]  HPI 71 year old male former smoker with previous pulmonary emboli and DVT in 2015, status post EKOS (completed 1 yr of Eliquis ) with recurrent PE and moderate pericardial effusion on 09/19/16 .  New dx lung cancer 08/2016 w/ pleural fluid cytology positive for adenocarcinoma.   TEST  10/23 - Pericardiocentesis:  Removal 860cc of serosanguineous fluid w/ drain left in place  STUDIES:  CTA chest 10/21: Several tiny bilateral pulmonary emboli. Small associated right pleural effusion and right basilar atelectasis. RV/LV ratio 0.99 compatible with intermediate risk pulmonary embolism.  Moderate size pericardial effusion. Mild emphysematous disease. 8 mm pleural based nodule opacity over the left lower lobe. Small right pleural effusion with associated atelectasis. BLE venous dopplers 10/22: There is acute, mobile thrombus noted in the right soleal vein. Acute DVT noted in the right gastrocnemius and peroneal veins and acute DVT noted in the left peroneal vein.   [facility] follow up Extended OV  Patient returns for a post hospital follow-up. Patient was recently admitted October 21 of October 25 for recurrent PE/DVT. Patient had previous PE and DVT in 2015 8 weeks post op felt to be a provoked clot and was treated with 1 year of Eliquis. Patient presented to the emergency room with 3 days of exertional shortness of breath. He was found to have several bilateral pulmonary emboli with a right pleural effusion and a moderate pericardial effusion. Venous Doppler showed a an acute mobile thrombus noted in the right soleal vein. An acute DVT in the right gastrocnemius and peroneal veins. An acute DVT in the left peroneal vein. Initial 2-D echo showed a large pericardial effusion with signs of early tamponade.  Underwent a pericardiocentesis with 860 cc of serosanguineous fluid removed. Follow-up 2-D echo on  09/22/2016 showed an EF of 55-60% no remaining pericardial effusion. Patient was started on heparin and transition to Xarelto prior to discharge. Unfortunately, patient cytology came back positive for adenocarcinoma. Patient has been set up with oncology with Dr. Earlie Server on 10/05/2016. He has a plans for a follow-up 2-D echo next week. We went over his test results and answered multiple questions. He and his wife have. He says since discharge he is starting to feel better. He has less dyspnea. He denies any cough, hemoptysis, chest pain, orthopnea, PND, or increased leg swelling.  He discussed with his new diagnosis of cancer that Lovenox may be superior anticoagulant in the setting of underlying active cancer . We discussed benefit /risk. He would like to stay with Xarelto for now.   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 He is on [place] and is tolerating well. Being maintained on xarelto as well. Denies CP. He does have some L thigh pain that is more longstanding, related to his hip sgy. He would like to stop mucinex. Denies any dyspnea or cough.   ROV 04/05/17 -- patient has a history of stage IV adenocarcinoma (PDL-1 80%), DVT and pulmonary embolism. He received immunotherapy, is now on conventional chemotherapy (first dose 02/24/17). He has had progressive exertional SOB, inability to walk through the house or do yardwork. Was seen in the ED 5/1 for sores in his mouth, increase SOB.  A CT-PA was done on 5/1 that I have personally reviewed. This showed no evidence of recurrent PE, a  decrease in the size of his left lower lobe nodule and decreased hilar adenopathy. He remains on xarelto.     Past Medical History:  Diagnosis Date  . Abdominal aortic aneurysm ([place])   . Arthritis    "hips; lower spine" (05/24/2014)  . Chemotherapy induced neutropenia ([place]) 03/31/2017  . Chronic fatigue 10/05/2016  . Encounter for  antineoplastic chemotherapy 02/17/2017  . Hyperlipidemia   . Hypertension   . Lung cancer ([place]) 09/25/2016  . PE (pulmonary embolism) 9/15  . Pericardial effusion 09/20/2016   Malignant effusion s/p pericardial drain  . Personal history of DVT (deep vein thrombosis) 9/15   Current Outpatient Prescriptions on File Prior to Visit  Medication Sig Dispense Refill  . atorvastatin (LIPITOR) 20 MG tablet Take 1 tablet (20 mg total) by mouth daily at 6 PM. 90 tablet 3  . dexamethasone (DECADRON) 4 MG tablet 4 mg by mouth twice a day the day before, day of and day after the chemotherapy every 3 weeks. 40 tablet 1  . diphenhydrAMINE (BENADRYL) 25 mg capsule Take 25 mg by mouth every 6 (six) hours as needed.    . folic acid (FOLVITE) 1 MG tablet Take 1 tablet (1 mg total) by mouth daily. 30 tablet 4  . lidocaine-prilocaine (EMLA) cream Apply 1 application topically as needed. 30 g 0  . magic mouthwash SOLN Take 5 mLs by mouth 4 (four) times daily. Hydrocortisone 60 mg ,. Nystatin 30 ml Benadryl 12.5 mg/5 ml. Mix to 240 ml 240 mL 1  . prochlorperazine (COMPAZINE) 10 MG tablet Take 1 tablet (10 mg total) by mouth every 6 (six) hours as needed for nausea or vomiting. 30 tablet 0  . XARELTO 20 MG TABS tablet TAKE 1 TABLET BY MOUTH EVERY DAY 30 tablet 1   No current facility-administered medications on file prior to visit.      Review of Systems     Objective:   Physical Exam  . 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 Tolerating Xarelto. We'll continue this.  Adenocarcinoma of left lung, stage 4 (HCC) Treated initially with immunotherapy, now on conventional chemotherapy. Complicated by stomatitis, fatigue. Suspect that much of his dyspnea is also related.  Dyspnea on exertion Suspect that this relates to his chemotherapy as it is temporally related. Consider also possible influence of his history of tobacco, COPD that has not been formally diagnosed. There was no evidence of pulmonary embolism on his recent CT scan 1 week ago. We will do a trial of empiric Spiriva to see if he benefits. We can continue if so. Consider PFT in the future. I suspect that his dyspnea will improve when he completes his chemotherapy.  Baltazar Apo, MD, PhD 04/05/2017, 11:50 AM [facility] [phone] or if no answer [phone]

## 2017-04-05 NOTE — Addendum Note (Signed)
Addended by: Jannette Spanner on: 04/05/2017 12:08 PM   Modules accepted: Orders

## 2017-04-05 NOTE — Assessment & Plan Note (Signed)
Suspect that this relates to his chemotherapy as it is temporally related. Consider also possible influence of his history of tobacco, COPD that has not been formally diagnosed. There was no evidence of pulmonary embolism on his recent CT scan 1 week ago. We will do a trial of empiric Spiriva to see if he benefits. We can continue if so. Consider PFT in the future. I suspect that his dyspnea will improve when he completes his chemotherapy.

## 2017-04-06 ENCOUNTER — Telehealth: Payer: Self-pay | Admitting: *Deleted

## 2017-04-06 NOTE — Telephone Encounter (Signed)
Call received from [place] with [facility] requesting to substitute a prednisone liquid for hydrocortisone tablets.  [facility] not able to crush medicines.  Verbal order received and read back from Dr. [name] for prednisone liquid substitution.  Mendel Ryder given this order with this call.  Second call received asking if equal parts of each ingredient.  Advised 1:1:1 concentration.

## 2017-04-06 NOTE — Progress Notes (Signed)
Order changed.  See medicine notes.

## 2017-04-07 ENCOUNTER — Telehealth: Payer: Self-pay | Admitting: Internal Medicine

## 2017-04-07 ENCOUNTER — Ambulatory Visit (HOSPITAL_BASED_OUTPATIENT_CLINIC_OR_DEPARTMENT_OTHER): Payer: Medicare Other | Admitting: Internal Medicine

## 2017-04-07 ENCOUNTER — Other Ambulatory Visit: Payer: Medicare Other

## 2017-04-07 ENCOUNTER — Encounter: Payer: Self-pay | Admitting: Internal Medicine

## 2017-04-07 ENCOUNTER — Ambulatory Visit (HOSPITAL_BASED_OUTPATIENT_CLINIC_OR_DEPARTMENT_OTHER): Payer: Medicare Other

## 2017-04-07 ENCOUNTER — Other Ambulatory Visit (HOSPITAL_BASED_OUTPATIENT_CLINIC_OR_DEPARTMENT_OTHER): Payer: Medicare Other

## 2017-04-07 ENCOUNTER — Ambulatory Visit: Payer: Medicare Other

## 2017-04-07 VITALS — BP 153/84 | HR 71 | Temp 98.2°F | Resp 20 | Ht 71.0 in | Wt 207.6 lb

## 2017-04-07 VITALS — BP 141/76

## 2017-04-07 DIAGNOSIS — J449 Chronic obstructive pulmonary disease, unspecified: Secondary | ICD-10-CM | POA: Diagnosis not present

## 2017-04-07 DIAGNOSIS — C3492 Malignant neoplasm of unspecified part of left bronchus or lung: Secondary | ICD-10-CM

## 2017-04-07 DIAGNOSIS — Z5112 Encounter for antineoplastic immunotherapy: Secondary | ICD-10-CM | POA: Diagnosis not present

## 2017-04-07 DIAGNOSIS — K1230 Oral mucositis (ulcerative), unspecified: Secondary | ICD-10-CM

## 2017-04-07 DIAGNOSIS — I313 Pericardial effusion (noninflammatory): Principal | ICD-10-CM

## 2017-04-07 DIAGNOSIS — C3432 Malignant neoplasm of lower lobe, left bronchus or lung: Secondary | ICD-10-CM

## 2017-04-07 DIAGNOSIS — Z5111 Encounter for antineoplastic chemotherapy: Secondary | ICD-10-CM | POA: Diagnosis not present

## 2017-04-07 DIAGNOSIS — C801 Malignant (primary) neoplasm, unspecified: Secondary | ICD-10-CM

## 2017-04-07 DIAGNOSIS — T451X5A Adverse effect of antineoplastic and immunosuppressive drugs, initial encounter: Secondary | ICD-10-CM

## 2017-04-07 DIAGNOSIS — I10 Essential (primary) hypertension: Secondary | ICD-10-CM

## 2017-04-07 DIAGNOSIS — I2699 Other pulmonary embolism without acute cor pulmonale: Secondary | ICD-10-CM

## 2017-04-07 DIAGNOSIS — D701 Agranulocytosis secondary to cancer chemotherapy: Secondary | ICD-10-CM

## 2017-04-07 DIAGNOSIS — I3131 Malignant pericardial effusion in diseases classified elsewhere: Secondary | ICD-10-CM

## 2017-04-07 LAB — COMPREHENSIVE METABOLIC PANEL
ALT: 51 U/L (ref 0–55)
ANION GAP: 9 meq/L (ref 3–11)
AST: 29 U/L (ref 5–34)
Albumin: 3.5 g/dL (ref 3.5–5.0)
Alkaline Phosphatase: 72 U/L (ref 40–150)
BUN: 17.3 mg/dL (ref 7.0–26.0)
CHLORIDE: 109 meq/L (ref 98–109)
CO2: 22 meq/L (ref 22–29)
CREATININE: 1 mg/dL (ref 0.7–1.3)
Calcium: 9.2 mg/dL (ref 8.4–10.4)
EGFR: 79 mL/min/{1.73_m2} — ABNORMAL LOW (ref >90)
Glucose: 106 mg/dl (ref 70–140)
POTASSIUM: 4.3 meq/L (ref 3.5–5.1)
Sodium: 140 mEq/L (ref 136–145)
Total Bilirubin: 0.31 mg/dL (ref 0.20–1.20)
Total Protein: 7.1 g/dL (ref 6.4–8.3)

## 2017-04-07 LAB — CBC WITH DIFFERENTIAL/PLATELET
BASO%: 0.7 % (ref 0.0–2.0)
Basophils Absolute: 0.1 10*3/uL (ref 0.0–0.1)
EOS%: 0 % (ref 0.0–7.0)
Eosinophils Absolute: 0 10*3/uL (ref 0.0–0.5)
HCT: 41.1 % (ref 38.4–49.9)
HGB: 13.8 g/dL (ref 13.0–17.1)
LYMPH%: 8.3 % — AB (ref 14.0–49.0)
MCH: 32.6 pg (ref 27.2–33.4)
MCHC: 33.6 g/dL (ref 32.0–36.0)
MCV: 97 fL (ref 79.3–98.0)
MONO#: 1.4 10*3/uL — ABNORMAL HIGH (ref 0.1–0.9)
MONO%: 8.7 % (ref 0.0–14.0)
NEUT#: 13.1 10*3/uL — ABNORMAL HIGH (ref 1.5–6.5)
NEUT%: 82.3 % — AB (ref 39.0–75.0)
PLATELETS: 269 10*3/uL (ref 140–400)
RBC: 4.24 10*6/uL (ref 4.20–5.82)
RDW: 15.6 % — ABNORMAL HIGH (ref 11.0–14.6)
WBC: 15.9 10*3/uL — ABNORMAL HIGH (ref 4.0–10.3)
lymph#: 1.3 10*3/uL (ref 0.9–3.3)

## 2017-04-07 LAB — UA PROTEIN, DIPSTICK - CHCC: Protein, ur: NEGATIVE mg/dL

## 2017-04-07 MED ORDER — SODIUM CHLORIDE 0.9 % IV SOLN
500.0000 mg/m2 | Freq: Once | INTRAVENOUS | Status: AC
  Administered 2017-04-07: 1100 mg via INTRAVENOUS
  Filled 2017-04-07: qty 40

## 2017-04-07 MED ORDER — SODIUM CHLORIDE 0.9 % IV SOLN
Freq: Once | INTRAVENOUS | Status: AC
Start: 2017-04-07 — End: 2017-04-07
  Administered 2017-04-07: 11:00:00 via INTRAVENOUS

## 2017-04-07 MED ORDER — SODIUM CHLORIDE 0.9 % IV SOLN
Freq: Once | INTRAVENOUS | Status: AC
  Administered 2017-04-07: 11:00:00 via INTRAVENOUS
  Filled 2017-04-07: qty 5

## 2017-04-07 MED ORDER — PALONOSETRON HCL INJECTION 0.25 MG/5ML
0.2500 mg | Freq: Once | INTRAVENOUS | Status: AC
  Administered 2017-04-07: 0.25 mg via INTRAVENOUS

## 2017-04-07 MED ORDER — PALONOSETRON HCL INJECTION 0.25 MG/5ML
INTRAVENOUS | Status: AC
  Filled 2017-04-07: qty 5

## 2017-04-07 MED ORDER — SODIUM CHLORIDE 0.9 % IV SOLN
14.6000 mg/kg | Freq: Once | INTRAVENOUS | Status: AC
  Administered 2017-04-07: 1400 mg via INTRAVENOUS
  Filled 2017-04-07: qty 48

## 2017-04-07 MED ORDER — SODIUM CHLORIDE 0.9% FLUSH
10.0000 mL | INTRAVENOUS | Status: DC | PRN
  Administered 2017-04-07: 10 mL
  Filled 2017-04-07: qty 10

## 2017-04-07 MED ORDER — HEPARIN SOD (PORK) LOCK FLUSH 100 UNIT/ML IV SOLN
500.0000 [IU] | Freq: Once | INTRAVENOUS | Status: AC | PRN
  Administered 2017-04-07: 500 [IU]
  Filled 2017-04-07: qty 5

## 2017-04-07 MED ORDER — SODIUM CHLORIDE 0.9 % IV SOLN
589.0000 mg | Freq: Once | INTRAVENOUS | Status: AC
  Administered 2017-04-07: 590 mg via INTRAVENOUS
  Filled 2017-04-07: qty 59

## 2017-04-07 NOTE — Patient Instructions (Signed)
[facility] Discharge Instructions for Patients Receiving Chemotherapy  Today you received the following chemotherapy agents: Avastin, Alimta, Carboplatin  To help prevent nausea and vomiting after your treatment, we encourage you to take your nausea medication as directed.    If you develop nausea and vomiting that is not controlled by your nausea medication, call the clinic.   BELOW ARE SYMPTOMS THAT SHOULD BE REPORTED IMMEDIATELY:  *FEVER GREATER THAN 100.5 F  *CHILLS WITH OR WITHOUT FEVER  NAUSEA AND VOMITING THAT IS NOT CONTROLLED WITH YOUR NAUSEA MEDICATION  *UNUSUAL SHORTNESS OF BREATH  *UNUSUAL BRUISING OR BLEEDING  TENDERNESS IN MOUTH AND THROAT WITH OR WITHOUT PRESENCE OF ULCERS  *URINARY PROBLEMS  *BOWEL PROBLEMS  UNUSUAL RASH Items with * indicate a potential emergency and should be followed up as soon as possible.  Feel free to call the clinic you have any questions or concerns. The clinic phone number is [phone].  Please show the [facility] at check-in to the Emergency Department and triage nurse.

## 2017-04-07 NOTE — Telephone Encounter (Signed)
Appts scheduled per 5/9 los already .

## 2017-04-07 NOTE — Telephone Encounter (Signed)
Appointments scheduled per 5.9.18 LOS. Patient given AVS report and calendars with future scheduled appointments.

## 2017-04-07 NOTE — Progress Notes (Signed)
[facility] [phone]   [phone]  OFFICE PROGRESS NOTE  Rankins, [name], MD [place] [address]  DIAGNOSIS: Stage IV (T1a, N3, M1b) non-small cell lung cancer, adenocarcinoma with PDL 1 expression of 80%. 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  PRIOR THERAPY: Treatment with immunotherapy with [name] (pembrolizumab) 200 MG IV every 3 weeks status post 6 cycles discontinued secondary to disease progression..  CURRENT THERAPY: Systemic chemotherapy with carboplatin for AUC of 5, Alimta 500 MG/M2 and Avastin 15 MG/KG every 3 weeks. First dose 02/24/2017. Status post 2 cycles.  INTERVAL HISTORY: Nicholas Hale 71 y.o. male returns to the clinic today for follow-up visit accompanied by his wife. The patient is feeling much better today with no specific complaints. His itching has significantly improved after he discontinued treatment with [place]. He was seen recently at the emergency Department complaining of shortness of breath and CT of the chest was performed at that time that showed improvement of his disease. He was also seen by Dr. Lamonte Sakai and started on treatment with Spiriva for COPD. He denied having any recent weight loss or night sweats. He has no nausea, vomiting, diarrhea or constipation. He has no fever or chills. He denied having any recent chest pain but has shortness of breath with exertion was no cough or hemoptysis. He is here today for evaluation before starting cycle #3 of his treatment.  MEDICAL HISTORY: Past Medical History:  Diagnosis Date  . Abdominal aortic aneurysm ([place])   . Arthritis    "hips; lower spine" (05/24/2014)  . Chemotherapy induced neutropenia ([place]) 03/31/2017  . Chronic fatigue 10/05/2016  . Encounter for  antineoplastic chemotherapy 02/17/2017  . Hyperlipidemia   . Hypertension   . Lung cancer ([place]) 09/25/2016  . PE (pulmonary embolism) 9/15  . Pericardial effusion 09/20/2016   Malignant effusion s/p pericardial drain  . Personal history of DVT (deep vein thrombosis) 9/15    ALLERGIES:  has No Known Allergies.  MEDICATIONS:  Current Outpatient Prescriptions  Medication Sig Dispense Refill  . amoxicillin (AMOXIL) 500 MG capsule Take 500 mg by mouth 3 (three) times daily.    Marland Kitchen atorvastatin (LIPITOR) 20 MG tablet Take 1 tablet (20 mg total) by mouth daily at 6 PM. 90 tablet 3  . chlorhexidine (PERIDEX) 0.12 % solution 5 mLs by Mouth Rinse route 2 (two) times daily.    Marland Kitchen dexamethasone (DECADRON) 4 MG tablet 4 mg by mouth twice a day the day before, day of and day after the chemotherapy every 3 weeks. 40 tablet 1  . diphenhydrAMINE (BENADRYL) 25 mg capsule Take 25 mg by mouth every 6 (six) hours as needed.    . folic acid (FOLVITE) 1 MG tablet Take 1 tablet (1 mg total) by mouth daily. 30 tablet 4  . lidocaine-prilocaine (EMLA) cream Apply 1 application topically as needed. 30 g 0  . magic mouthwash SOLN Take 5 mLs by mouth 4 (four) times daily as needed.  1  . magic mouthwash SOLN Take 5 mLs by mouth 4 (four) times daily. Hydrocortisone 60 mg ,. Nystatin 30 ml Benadryl 12.5 mg/5 ml. Mix to 240 ml 240 mL 1  . prochlorperazine (COMPAZINE) 10 MG tablet Take 1 tablet (10 mg total) by mouth every 6 (six) hours as needed for nausea or vomiting. 30 tablet  0  . Tiotropium Bromide Monohydrate (SPIRIVA RESPIMAT) 2.5 MCG/ACT AERS Inhale 2 puffs into the lungs daily. 3 Inhaler 0  . XARELTO 20 MG TABS tablet TAKE 1 TABLET BY MOUTH EVERY DAY 30 tablet 1   No current facility-administered medications for this visit.     SURGICAL HISTORY:  Past Surgical History:  Procedure Laterality Date  . ANTERIOR FUSION CERVICAL SPINE  ~ 2005  . APPENDECTOMY  1978  . CARDIAC CATHETERIZATION N/A 09/21/2016    Procedure: Pericardiocentesis;  Surgeon: Jettie Booze, MD;  Location: [facility];  Service: Cardiovascular;  Laterality: N/A;  . COLONOSCOPY    . HEMIARTHROPLASTY SHOULDER FRACTURE Left 12/2008  . INGUINAL HERNIA REPAIR Bilateral 1992  . IR GENERIC HISTORICAL  02/01/2017   IR FLUORO GUIDE PORT INSERTION RIGHT 02/01/2017 Arne Cleveland, MD [facility]  . IR GENERIC HISTORICAL  02/01/2017   IR US GUIDE VASC ACCESS RIGHT 02/01/2017 Arne Cleveland, MD [facility]  . JOINT REPLACEMENT    . PERICARDIAL FLUID DRAINAGE  09/21/2016  . TOTAL HIP ARTHROPLASTY Left 05/23/2014   Procedure: TOTAL HIP ARTHROPLASTY;  Surgeon: Kerin Salen, MD;  Location: [facility];  Service: Orthopedics;  Laterality: Left;  . TOTAL HIP ARTHROPLASTY Right 05/15/2015   Procedure: TOTAL HIP ARTHROPLASTY;  Surgeon: Frederik Pear, MD;  Location: [facility];  Service: Orthopedics;  Laterality: Right;    REVIEW OF SYSTEMS:  Constitutional: negative Eyes: negative Ears, nose, mouth, throat, and face: negative Respiratory: positive for dyspnea on exertion Cardiovascular: negative Gastrointestinal: negative Genitourinary:negative Integument/breast: negative Hematologic/lymphatic: negative Musculoskeletal:negative Neurological: negative Behavioral/Psych: negative Endocrine: negative Allergic/Immunologic: negative   PHYSICAL EXAMINATION: General appearance: alert, cooperative and no distress Head: Normocephalic, without obvious abnormality, atraumatic Neck: no adenopathy, no JVD, supple, symmetrical, trachea midline and thyroid not enlarged, symmetric, no tenderness/mass/nodules Lymph nodes: Cervical, supraclavicular, and axillary nodes normal. Resp: clear to auscultation bilaterally Back: symmetric, no curvature. ROM normal. No CVA tenderness. Cardio: regular rate and rhythm, S1, S2 normal, no murmur, click, rub or gallop GI: soft, non-tender; bowel sounds normal; no masses,  no organomegaly Extremities: extremities  normal, atraumatic, no cyanosis or edema Neurologic: Alert and oriented X 3, normal strength and tone. Normal symmetric reflexes. Normal coordination and gait  ECOG PERFORMANCE STATUS: 1 - Symptomatic but completely ambulatory  Blood pressure (!) 153/84, pulse 71, temperature 98.2 F (36.8 C), temperature source Oral, resp. rate 20, height '5\' 11"'$  (1.803 m), weight 207 lb 9.6 oz (94.2 kg), SpO2 100 %.  LABORATORY DATA: Lab Results  Component Value Date   WBC 2.3 (L) 03/31/2017   HGB 14.1 03/31/2017   HCT 41.3 03/31/2017   MCV 94.8 03/31/2017   PLT 123 (L) 03/31/2017      Chemistry      Component Value Date/Time   NA 137 03/30/2017 1859   NA 138 03/24/2017 0820   K 4.3 03/30/2017 1859   K 4.7 03/24/2017 0820   CL 105 03/30/2017 1859   CO2 24 03/30/2017 1859   CO2 24 03/24/2017 0820   BUN 15 03/30/2017 1859   BUN 16.0 03/24/2017 0820   CREATININE 1.07 03/30/2017 1859   CREATININE 0.8 03/24/2017 0820      Component Value Date/Time   CALCIUM 8.6 (L) 03/30/2017 1859   CALCIUM 8.7 03/24/2017 0820   ALKPHOS 75 03/30/2017 1859   ALKPHOS 76 03/24/2017 0820   AST 39 03/30/2017 1859   AST 34 03/24/2017 0820   ALT 61 03/30/2017 1859   ALT 62 (H) 03/24/2017 0820  BILITOT 0.6 03/30/2017 1859   BILITOT 0.70 03/24/2017 0820       RADIOGRAPHIC STUDIES: Dg Chest 2 View  Result Date: 03/30/2017 CLINICAL DATA:  History of small cell lung cancer. Shortness of breath EXAM: CHEST  2 VIEW COMPARISON:  Chest CT 02/15/2017 FINDINGS: Normal heart size and stable mediastinal contours. There is left hilar adenopathy by CT. Porta catheter on the right with tip at the SVC level. Known left-sided lung nodule. There is no edema, consolidation, effusion, or pneumothorax. IMPRESSION: 1. No acute finding. 2. Known left lung nodule. Electronically Signed   By: Monte Fantasia M.D.   On: 03/30/2017 19:42   Ct Angio Chest W/cm &/or Wo Cm  Result Date: 03/30/2017 CLINICAL DATA:  Shortness of breath.  Chemotherapy 2 weeks ago for lung cancer. EXAM: CT ANGIOGRAPHY CHEST WITH CONTRAST TECHNIQUE: Multidetector CT imaging of the chest was performed using the standard protocol during bolus administration of intravenous contrast. Multiplanar CT image reconstructions and MIPs were obtained to evaluate the vascular anatomy. CONTRAST:  84 mL Isovue 370 IV COMPARISON:  02/15/2017, 12/09/2016 and PET-CT 10/05/2016 FINDINGS: Cardiovascular: Heart is normal size. Thoracic aorta is within normal. No evidence of pulmonary emboli. Mediastinum/Nodes: Interval decrease in size of left hilar adenopathy measuring 1 cm by short axis (previously 1.4 cm). No mediastinal or right hilar adenopathy. There is an air-fluid level over the proximal to mid esophagus likely due to dysmotility or reflux. Lungs/Pleura: Lungs are adequately inflated. There are mild emphysematous changes. There has been slight interval decrease in size of the subpleural nodule over the left lower lobe measuring 0.8 x 1.2 cm (previously 0.8 x 2.1 cm). The previously seen adjacent 7 mm nodule in the left lower lobe is not visualized. No new nodules identified. No consolidation or effusion. Airways are within normal. Upper Abdomen: Stable 1.4 cm hypodensity over the left lobe of the liver not hypermetabolic on previous PET-CT. Stable right renal cysts. Musculoskeletal: Mild to moderate degenerate change of the spine. Review of the MIP images confirms the above findings. IMPRESSION: No evidence of pulmonary embolism. No acute cardiopulmonary disease. Interval decrease in size of left lower lobe subpleural nodule measuring 0.8 x 1.2 cm (previously 0.8 x 2.1 cm) previously seen adjacent 7 mm left lower lobe nodule resolved. Decreased left hilar adenopathy. Mild emphysematous disease. Air-fluid level over the mid esophagus likely due to dysmotility or reflux. Stable 1.4 cm left liver hypodensity.  Right renal cysts. Electronically Signed   By: Marin Olp M.D.   On:  03/30/2017 21:17    ASSESSMENT AND PLAN:  This is a very pleasant 71 years old white male with a stage IV non-small cell lung cancer, adenocarcinoma with PDL 1 expression of 80%. He was started on treatment with [name] (pembrolizumab) for 6 cycles and tolerated the treatment well except for itching. Unfortunately he had evidence for disease progression after cycle #6 and the treatment was discontinued. He is currently on systemic chemotherapy with carboplatin, Alimta and Avastin status post 2 cycles. He is tolerating this treatment well except for hypertension. He also had mouth sores after the treatment. He is currently on Biotene as needed. His recent CT scan of the chest showed improvement of his disease was decrease in the size of the left lower lobe lung nodule in addition to decrease in the left hilar adenopathy. I discussed the scan results with the patient his wife today. I recommended for him to continue his current treatment with carboplatin, Alimta and Avastin as scheduled. He  will proceed with cycle #3 today. For the mouth sores he will continue with Biotene as well as saltwater and Magic mouthwash. For hypertension, this is most likely related to his treatment with the Avastin. We will continue to monitor closely and consider the patient for blood pressure treatment if needed. For COPD, he will continue on Spiriva. He would come back for follow-up visit in 3 weeks with the start of cycle #4. The patient was advised to call immediately if he has any concerning symptoms in the interval. The patient voices understanding of current disease status and treatment options and is in agreement with the current care plan. All questions were answered. The patient knows to call the clinic with any problems, questions or concerns. We can certainly see the patient much sooner if necessary.  Disclaimer: This note was dictated with voice recognition software. Similar sounding words can inadvertently be  transcribed and may not be corrected upon review.

## 2017-04-13 ENCOUNTER — Telehealth: Payer: Self-pay | Admitting: *Deleted

## 2017-04-13 NOTE — Telephone Encounter (Signed)
Called pt to r/s lab/flush only appts to afternoon to help alleviate infusion room schedule. Pt agreeable to come in at 230 and was appreciative of call, understanding of change. Flush moved from infusion to flush room.

## 2017-04-14 ENCOUNTER — Other Ambulatory Visit (HOSPITAL_BASED_OUTPATIENT_CLINIC_OR_DEPARTMENT_OTHER): Payer: Medicare Other

## 2017-04-14 ENCOUNTER — Ambulatory Visit (HOSPITAL_BASED_OUTPATIENT_CLINIC_OR_DEPARTMENT_OTHER): Payer: Medicare Other

## 2017-04-14 DIAGNOSIS — Z5112 Encounter for antineoplastic immunotherapy: Secondary | ICD-10-CM

## 2017-04-14 DIAGNOSIS — Z79899 Other long term (current) drug therapy: Secondary | ICD-10-CM | POA: Diagnosis not present

## 2017-04-14 DIAGNOSIS — Z452 Encounter for adjustment and management of vascular access device: Secondary | ICD-10-CM | POA: Diagnosis not present

## 2017-04-14 DIAGNOSIS — C3432 Malignant neoplasm of lower lobe, left bronchus or lung: Secondary | ICD-10-CM | POA: Diagnosis not present

## 2017-04-14 DIAGNOSIS — R5382 Chronic fatigue, unspecified: Secondary | ICD-10-CM

## 2017-04-14 DIAGNOSIS — Z95828 Presence of other vascular implants and grafts: Secondary | ICD-10-CM

## 2017-04-14 DIAGNOSIS — C3492 Malignant neoplasm of unspecified part of left bronchus or lung: Secondary | ICD-10-CM

## 2017-04-14 LAB — COMPREHENSIVE METABOLIC PANEL
ALBUMIN: 3.3 g/dL — AB (ref 3.5–5.0)
ALK PHOS: 70 U/L (ref 40–150)
ALT: 51 U/L (ref 0–55)
AST: 27 U/L (ref 5–34)
Anion Gap: 8 mEq/L (ref 3–11)
BUN: 25.2 mg/dL (ref 7.0–26.0)
CALCIUM: 8.6 mg/dL (ref 8.4–10.4)
CO2: 26 mEq/L (ref 22–29)
Chloride: 104 mEq/L (ref 98–109)
Creatinine: 0.9 mg/dL (ref 0.7–1.3)
EGFR: 86 mL/min/{1.73_m2} — AB (ref >90)
Glucose: 122 mg/dl (ref 70–140)
POTASSIUM: 4.2 meq/L (ref 3.5–5.1)
Sodium: 139 mEq/L (ref 136–145)
Total Bilirubin: 0.59 mg/dL (ref 0.20–1.20)
Total Protein: 6.2 g/dL — ABNORMAL LOW (ref 6.4–8.3)

## 2017-04-14 LAB — CBC WITH DIFFERENTIAL/PLATELET
BASO%: 1.3 % (ref 0.0–2.0)
Basophils Absolute: 0 10*3/uL (ref 0.0–0.1)
EOS ABS: 0 10*3/uL (ref 0.0–0.5)
EOS%: 0.7 % (ref 0.0–7.0)
HEMATOCRIT: 41.4 % (ref 38.4–49.9)
HEMOGLOBIN: 14 g/dL (ref 13.0–17.1)
LYMPH%: 28.6 % (ref 14.0–49.0)
MCH: 32.6 pg (ref 27.2–33.4)
MCHC: 33.7 g/dL (ref 32.0–36.0)
MCV: 96.5 fL (ref 79.3–98.0)
MONO#: 0.2 10*3/uL (ref 0.1–0.9)
MONO%: 5.5 % (ref 0.0–14.0)
NEUT%: 63.9 % (ref 39.0–75.0)
NEUTROS ABS: 2.3 10*3/uL (ref 1.5–6.5)
Platelets: 76 10*3/uL — ABNORMAL LOW (ref 140–400)
RBC: 4.29 10*6/uL (ref 4.20–5.82)
RDW: 15.7 % — AB (ref 11.0–14.6)
WBC: 3.6 10*3/uL — AB (ref 4.0–10.3)
lymph#: 1 10*3/uL (ref 0.9–3.3)

## 2017-04-14 LAB — TSH: TSH: 5.533 m[IU]/L — AB (ref 0.320–4.118)

## 2017-04-14 MED ORDER — HEPARIN SOD (PORK) LOCK FLUSH 100 UNIT/ML IV SOLN
500.0000 [IU] | Freq: Once | INTRAVENOUS | Status: AC | PRN
  Administered 2017-04-14: 500 [IU] via INTRAVENOUS
  Filled 2017-04-14: qty 5

## 2017-04-14 MED ORDER — SODIUM CHLORIDE 0.9% FLUSH
10.0000 mL | INTRAVENOUS | Status: DC | PRN
  Administered 2017-04-14: 10 mL via INTRAVENOUS
  Filled 2017-04-14: qty 10

## 2017-04-16 ENCOUNTER — Other Ambulatory Visit: Payer: Self-pay | Admitting: Medical Oncology

## 2017-04-16 DIAGNOSIS — C3492 Malignant neoplasm of unspecified part of left bronchus or lung: Secondary | ICD-10-CM

## 2017-04-16 MED ORDER — FOLIC ACID 1 MG PO TABS: 1.0000 mg | ORAL_TABLET | Freq: Every day | ORAL | 1 refills | Status: DC

## 2017-04-21 ENCOUNTER — Other Ambulatory Visit (HOSPITAL_BASED_OUTPATIENT_CLINIC_OR_DEPARTMENT_OTHER): Payer: Medicare Other

## 2017-04-21 ENCOUNTER — Other Ambulatory Visit: Payer: Self-pay | Admitting: Emergency Medicine

## 2017-04-21 ENCOUNTER — Ambulatory Visit (HOSPITAL_BASED_OUTPATIENT_CLINIC_OR_DEPARTMENT_OTHER): Payer: Medicare Other

## 2017-04-21 DIAGNOSIS — C3492 Malignant neoplasm of unspecified part of left bronchus or lung: Secondary | ICD-10-CM

## 2017-04-21 DIAGNOSIS — C3432 Malignant neoplasm of lower lobe, left bronchus or lung: Secondary | ICD-10-CM | POA: Diagnosis not present

## 2017-04-21 DIAGNOSIS — Z452 Encounter for adjustment and management of vascular access device: Secondary | ICD-10-CM | POA: Diagnosis not present

## 2017-04-21 DIAGNOSIS — R0609 Other forms of dyspnea: Principal | ICD-10-CM

## 2017-04-21 DIAGNOSIS — Z95828 Presence of other vascular implants and grafts: Secondary | ICD-10-CM

## 2017-04-21 LAB — COMPREHENSIVE METABOLIC PANEL
ALT: 63 U/L — AB (ref 0–55)
AST: 36 U/L — AB (ref 5–34)
Albumin: 3.4 g/dL — ABNORMAL LOW (ref 3.5–5.0)
Alkaline Phosphatase: 74 U/L (ref 40–150)
Anion Gap: 8 mEq/L (ref 3–11)
BUN: 15.7 mg/dL (ref 7.0–26.0)
CALCIUM: 8.6 mg/dL (ref 8.4–10.4)
CHLORIDE: 111 meq/L — AB (ref 98–109)
CO2: 22 mEq/L (ref 22–29)
Creatinine: 0.9 mg/dL (ref 0.7–1.3)
EGFR: 86 mL/min/{1.73_m2} — ABNORMAL LOW (ref >90)
Glucose: 117 mg/dl (ref 70–140)
POTASSIUM: 3.8 meq/L (ref 3.5–5.1)
SODIUM: 142 meq/L (ref 136–145)
Total Bilirubin: 0.3 mg/dL (ref 0.20–1.20)
Total Protein: 6.4 g/dL (ref 6.4–8.3)

## 2017-04-21 LAB — CBC WITH DIFFERENTIAL/PLATELET
BASO%: 0 % (ref 0.0–2.0)
BASOS ABS: 0 10*3/uL (ref 0.0–0.1)
EOS%: 2.6 % (ref 0.0–7.0)
Eosinophils Absolute: 0.1 10*3/uL (ref 0.0–0.5)
HEMATOCRIT: 38.2 % — AB (ref 38.4–49.9)
HGB: 12.6 g/dL — ABNORMAL LOW (ref 13.0–17.1)
LYMPH%: 40.7 % (ref 14.0–49.0)
MCH: 33.2 pg (ref 27.2–33.4)
MCHC: 33 g/dL (ref 32.0–36.0)
MCV: 100.5 fL — ABNORMAL HIGH (ref 79.3–98.0)
MONO#: 0.3 10*3/uL (ref 0.1–0.9)
MONO%: 11.7 % (ref 0.0–14.0)
NEUT#: 1.2 10*3/uL — ABNORMAL LOW (ref 1.5–6.5)
NEUT%: 45 % (ref 39.0–75.0)
Platelets: 67 10*3/uL — ABNORMAL LOW (ref 140–400)
RBC: 3.8 10*6/uL — AB (ref 4.20–5.82)
RDW: 17.6 % — ABNORMAL HIGH (ref 11.0–14.6)
WBC: 2.7 10*3/uL — ABNORMAL LOW (ref 4.0–10.3)
lymph#: 1.1 10*3/uL (ref 0.9–3.3)

## 2017-04-21 LAB — UA PROTEIN, DIPSTICK - CHCC: PROTEIN: NEGATIVE mg/dL

## 2017-04-21 MED ORDER — HEPARIN SOD (PORK) LOCK FLUSH 100 UNIT/ML IV SOLN
500.0000 [IU] | Freq: Once | INTRAVENOUS | Status: AC | PRN
  Administered 2017-04-21: 500 [IU] via INTRAVENOUS
  Filled 2017-04-21: qty 5

## 2017-04-21 MED ORDER — SODIUM CHLORIDE 0.9% FLUSH
10.0000 mL | INTRAVENOUS | Status: DC | PRN
  Administered 2017-04-21: 10 mL via INTRAVENOUS
  Filled 2017-04-21: qty 10

## 2017-04-22 ENCOUNTER — Ambulatory Visit (INDEPENDENT_AMBULATORY_CARE_PROVIDER_SITE_OTHER): Payer: Medicare Other | Admitting: Emergency Medicine

## 2017-04-22 DIAGNOSIS — R0609 Other forms of dyspnea: Secondary | ICD-10-CM

## 2017-04-22 LAB — PULMONARY FUNCTION TEST
DL/VA % pred: 61 %
DL/VA: 2.83 ml/min/mmHg/L
DLCO UNC: 15.18 ml/min/mmHg
DLCO cor % pred: 50 %
DLCO cor: 16.47 ml/min/mmHg
DLCO unc % pred: 46 %
FEF 25-75 Post: 2.77 L/sec
FEF 25-75 Pre: 1.65 L/sec
FEF2575-%Change-Post: 68 %
FEF2575-%Pred-Post: 114 %
FEF2575-%Pred-Pre: 68 %
FEV1-%CHANGE-POST: 10 %
FEV1-%PRED-POST: 88 %
FEV1-%Pred-Pre: 79 %
FEV1-POST: 2.85 L
FEV1-PRE: 2.57 L
FEV1FVC-%Change-Post: 1 %
FEV1FVC-%PRED-PRE: 98 %
FEV6-%Change-Post: 11 %
FEV6-%PRED-POST: 94 %
FEV6-%Pred-Pre: 84 %
FEV6-POST: 3.89 L
FEV6-Pre: 3.51 L
FEV6FVC-%CHANGE-POST: 1 %
FEV6FVC-%PRED-POST: 106 %
FEV6FVC-%PRED-PRE: 104 %
FVC-%CHANGE-POST: 9 %
FVC-%Pred-Post: 88 %
FVC-%Pred-Pre: 81 %
FVC-Post: 3.9 L
FVC-Pre: 3.57 L
PRE FEV6/FVC RATIO: 98 %
Post FEV1/FVC ratio: 73 %
Post FEV6/FVC ratio: 100 %
Pre FEV1/FVC ratio: 72 %
RV % PRED: 111 %
RV: 2.76 L
TLC % PRED: 90 %
TLC: 6.37 L

## 2017-04-22 NOTE — Progress Notes (Signed)
PFT done today. 

## 2017-04-27 ENCOUNTER — Telehealth: Payer: Self-pay | Admitting: Emergency Medicine

## 2017-04-27 NOTE — Telephone Encounter (Signed)
Pt is calling for the results of the PFT.  [facility]he will be over this way tomorrow for his chemo treatment and would like to come by and pick up a copy of the PFT. RB please advise. Thanks

## 2017-04-28 ENCOUNTER — Ambulatory Visit (HOSPITAL_BASED_OUTPATIENT_CLINIC_OR_DEPARTMENT_OTHER): Payer: Medicare Other | Admitting: Internal Medicine

## 2017-04-28 ENCOUNTER — Other Ambulatory Visit (HOSPITAL_BASED_OUTPATIENT_CLINIC_OR_DEPARTMENT_OTHER): Payer: Medicare Other

## 2017-04-28 ENCOUNTER — Ambulatory Visit (HOSPITAL_BASED_OUTPATIENT_CLINIC_OR_DEPARTMENT_OTHER): Payer: Medicare Other

## 2017-04-28 ENCOUNTER — Telehealth: Payer: Self-pay | Admitting: Internal Medicine

## 2017-04-28 ENCOUNTER — Encounter: Payer: Self-pay | Admitting: Internal Medicine

## 2017-04-28 ENCOUNTER — Ambulatory Visit: Payer: Medicare Other

## 2017-04-28 VITALS — BP 139/60 | HR 82 | Temp 97.8°F | Resp 18 | Ht 71.0 in | Wt 208.6 lb

## 2017-04-28 DIAGNOSIS — Z5111 Encounter for antineoplastic chemotherapy: Secondary | ICD-10-CM | POA: Diagnosis not present

## 2017-04-28 DIAGNOSIS — C3432 Malignant neoplasm of lower lobe, left bronchus or lung: Secondary | ICD-10-CM

## 2017-04-28 DIAGNOSIS — Z5112 Encounter for antineoplastic immunotherapy: Secondary | ICD-10-CM

## 2017-04-28 DIAGNOSIS — C3492 Malignant neoplasm of unspecified part of left bronchus or lung: Secondary | ICD-10-CM

## 2017-04-28 DIAGNOSIS — Z95828 Presence of other vascular implants and grafts: Secondary | ICD-10-CM

## 2017-04-28 DIAGNOSIS — R5382 Chronic fatigue, unspecified: Secondary | ICD-10-CM

## 2017-04-28 LAB — CBC WITH DIFFERENTIAL/PLATELET
BASO%: 0.8 % (ref 0.0–2.0)
Basophils Absolute: 0 10*3/uL (ref 0.0–0.1)
EOS%: 0 % (ref 0.0–7.0)
Eosinophils Absolute: 0 10*3/uL (ref 0.0–0.5)
HCT: 38.4 % (ref 38.4–49.9)
HGB: 12.9 g/dL — ABNORMAL LOW (ref 13.0–17.1)
LYMPH%: 23.4 % (ref 14.0–49.0)
MCH: 33.6 pg — ABNORMAL HIGH (ref 27.2–33.4)
MCHC: 33.6 g/dL (ref 32.0–36.0)
MCV: 100.1 fL — ABNORMAL HIGH (ref 79.3–98.0)
MONO#: 0.6 10*3/uL (ref 0.1–0.9)
MONO%: 13.4 % (ref 0.0–14.0)
NEUT%: 62.4 % (ref 39.0–75.0)
NEUTROS ABS: 2.6 10*3/uL (ref 1.5–6.5)
PLATELETS: 178 10*3/uL (ref 140–400)
RBC: 3.83 10*6/uL — AB (ref 4.20–5.82)
RDW: 19.6 % — AB (ref 11.0–14.6)
WBC: 4.1 10*3/uL (ref 4.0–10.3)
lymph#: 1 10*3/uL (ref 0.9–3.3)

## 2017-04-28 LAB — COMPREHENSIVE METABOLIC PANEL
ALK PHOS: 69 U/L (ref 40–150)
ALT: 42 U/L (ref 0–55)
ANION GAP: 11 meq/L (ref 3–11)
AST: 25 U/L (ref 5–34)
Albumin: 3.3 g/dL — ABNORMAL LOW (ref 3.5–5.0)
BUN: 13.6 mg/dL (ref 7.0–26.0)
CALCIUM: 8.8 mg/dL (ref 8.4–10.4)
CO2: 22 mEq/L (ref 22–29)
Chloride: 109 mEq/L (ref 98–109)
Creatinine: 1 mg/dL (ref 0.7–1.3)
EGFR: 74 mL/min/{1.73_m2} — ABNORMAL LOW (ref >90)
Glucose: 206 mg/dl — ABNORMAL HIGH (ref 70–140)
POTASSIUM: 3.5 meq/L (ref 3.5–5.1)
Sodium: 142 mEq/L (ref 136–145)
Total Bilirubin: 0.31 mg/dL (ref 0.20–1.20)
Total Protein: 6.5 g/dL (ref 6.4–8.3)

## 2017-04-28 MED ORDER — SODIUM CHLORIDE 0.9 % IV SOLN
500.0000 mg/m2 | Freq: Once | INTRAVENOUS | Status: AC
  Administered 2017-04-28: 1100 mg via INTRAVENOUS
  Filled 2017-04-28: qty 40

## 2017-04-28 MED ORDER — SODIUM CHLORIDE 0.9% FLUSH
10.0000 mL | INTRAVENOUS | Status: DC | PRN
  Administered 2017-04-28: 10 mL
  Filled 2017-04-28: qty 10

## 2017-04-28 MED ORDER — CYANOCOBALAMIN 1000 MCG/ML IJ SOLN
INTRAMUSCULAR | Status: AC
  Filled 2017-04-28: qty 1

## 2017-04-28 MED ORDER — SODIUM CHLORIDE 0.9% FLUSH
10.0000 mL | INTRAVENOUS | Status: DC | PRN
  Administered 2017-04-28: 10 mL via INTRAVENOUS
  Filled 2017-04-28: qty 10

## 2017-04-28 MED ORDER — SODIUM CHLORIDE 0.9 % IV SOLN
Freq: Once | INTRAVENOUS | Status: AC
  Administered 2017-04-28: 14:00:00 via INTRAVENOUS
  Filled 2017-04-28: qty 5

## 2017-04-28 MED ORDER — PALONOSETRON HCL INJECTION 0.25 MG/5ML
0.2500 mg | Freq: Once | INTRAVENOUS | Status: AC
  Administered 2017-04-28: 0.25 mg via INTRAVENOUS

## 2017-04-28 MED ORDER — SODIUM CHLORIDE 0.9 % IV SOLN
Freq: Once | INTRAVENOUS | Status: AC
  Administered 2017-04-28: 14:00:00 via INTRAVENOUS

## 2017-04-28 MED ORDER — PALONOSETRON HCL INJECTION 0.25 MG/5ML
INTRAVENOUS | Status: AC
  Filled 2017-04-28: qty 5

## 2017-04-28 MED ORDER — SODIUM CHLORIDE 0.9 % IV SOLN
589.0000 mg | Freq: Once | INTRAVENOUS | Status: AC
  Administered 2017-04-28: 590 mg via INTRAVENOUS
  Filled 2017-04-28: qty 59

## 2017-04-28 MED ORDER — HEPARIN SOD (PORK) LOCK FLUSH 100 UNIT/ML IV SOLN
500.0000 [IU] | Freq: Once | INTRAVENOUS | Status: AC | PRN
  Administered 2017-04-28: 500 [IU]
  Filled 2017-04-28: qty 5

## 2017-04-28 MED ORDER — SODIUM CHLORIDE 0.9 % IV SOLN
14.7000 mg/kg | Freq: Once | INTRAVENOUS | Status: AC
  Administered 2017-04-28: 1400 mg via INTRAVENOUS
  Filled 2017-04-28: qty 48

## 2017-04-28 MED ORDER — CYANOCOBALAMIN 1000 MCG/ML IJ SOLN
1000.0000 ug | Freq: Once | INTRAMUSCULAR | Status: AC
  Administered 2017-04-28: 1000 ug via INTRAMUSCULAR

## 2017-04-28 NOTE — Telephone Encounter (Signed)
Impression on PFT per CDY:  Minimal obstructive disease, insignificant response to BD, normal lung volumes, diffusion moderately reduced  Spoke with the pt's spouse and notified that this is ready for pick up and will forward to RB to make aware of the result

## 2017-04-28 NOTE — Telephone Encounter (Signed)
Done and given to Jess to give to patient

## 2017-04-28 NOTE — Progress Notes (Signed)
[facility] [phone]   [phone]) [phone]  OFFICE PROGRESS NOTE  Rankins, [name], MD [place] [address]  DIAGNOSIS: Stage IV (T1a, N3, M1b) non-small cell lung cancer, adenocarcinoma with PDL 1 expression of 80%. 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  PRIOR THERAPY: Treatment with immunotherapy with [name] (pembrolizumab) 200 MG IV every 3 weeks status post 6 cycles discontinued secondary to disease progression..  CURRENT THERAPY: Systemic chemotherapy with carboplatin for AUC of 5, Alimta 500 MG/M2 and Avastin 15 MG/KG every 3 weeks. First dose 02/24/2017. Status post 3 cycles.  INTERVAL HISTORY: Nicholas Hale 71 y.o. male returns to the clinic today for follow-up visit. The patient is feeling fine today with no specific complaints. Has been complaining of fatigue for a few days after the treatment as well as mouth sores. He is currently on Biotene and saltwater. He denied having any chest pain, shortness of breath, cough or hemoptysis. He denied having any fever or chills. He has no nausea, vomiting, diarrhea or constipation. He is here today for evaluation before starting cycle #4.  MEDICAL HISTORY: Past Medical History:  Diagnosis Date  . Abdominal aortic aneurysm ([facility])   . Arthritis    "hips; lower spine" (05/24/2014)  . Chemotherapy induced neutropenia ([facility]) 03/31/2017  . Chronic fatigue 10/05/2016  . Encounter for antineoplastic chemotherapy 02/17/2017  . Hyperlipidemia   . Hypertension   . Lung cancer ([facility]) 09/25/2016  . PE (pulmonary embolism) 9/15  . Pericardial effusion 09/20/2016   Malignant effusion s/p pericardial drain  . Personal history of DVT (deep vein thrombosis) 9/15    ALLERGIES:  has No Known  Allergies.  MEDICATIONS:  Current Outpatient Prescriptions  Medication Sig Dispense Refill  . amoxicillin (AMOXIL) 500 MG capsule Take 500 mg by mouth 3 (three) times daily.    Marland Kitchen atorvastatin (LIPITOR) 20 MG tablet Take 1 tablet (20 mg total) by mouth daily at 6 PM. 90 tablet 3  . chlorhexidine (PERIDEX) 0.12 % solution 5 mLs by Mouth Rinse route 2 (two) times daily.    Marland Kitchen dexamethasone (DECADRON) 4 MG tablet 4 mg by mouth twice a day the day before, day of and day after the chemotherapy every 3 weeks. (Patient not taking: Reported on 04/07/2017) 40 tablet 1  . diphenhydrAMINE (BENADRYL) 25 mg capsule Take 25 mg by mouth every 6 (six) hours as needed.    . folic acid (FOLVITE) 1 MG tablet Take 1 tablet (1 mg total) by mouth daily. 30 tablet 1  . lidocaine-prilocaine (EMLA) cream Apply 1 application topically as needed. 30 g 0  . magic mouthwash SOLN Take 5 mLs by mouth 4 (four) times daily. Hydrocortisone 60 mg ,. Nystatin 30 ml Benadryl 12.5 mg/5 ml. Mix to 240 ml 240 mL 1  . prochlorperazine (COMPAZINE) 10 MG tablet Take 1 tablet (10 mg total) by mouth every 6 (six) hours as needed for nausea or vomiting. (Patient not taking: Reported on 04/07/2017) 30 tablet 0  . Tiotropium Bromide Monohydrate (SPIRIVA RESPIMAT) 2.5 MCG/ACT AERS Inhale 2 puffs into the lungs daily. 3 Inhaler 0  . XARELTO 20 MG TABS tablet TAKE 1 TABLET BY MOUTH EVERY DAY 30 tablet 1   No current facility-administered medications for this visit.     SURGICAL HISTORY:  Past Surgical History:  Procedure Laterality Date  .  ANTERIOR FUSION CERVICAL SPINE  ~ 2005  . APPENDECTOMY  1978  . CARDIAC CATHETERIZATION N/A 09/21/2016   Procedure: Pericardiocentesis;  Surgeon: Jettie Booze, MD;  Location: [facility];  Service: Cardiovascular;  Laterality: N/A;  . COLONOSCOPY    . HEMIARTHROPLASTY SHOULDER FRACTURE Left 12/2008  . INGUINAL HERNIA REPAIR Bilateral 1992  . IR GENERIC HISTORICAL  02/01/2017   IR FLUORO GUIDE  PORT INSERTION RIGHT 02/01/2017 Arne Cleveland, MD [facility]  . IR GENERIC HISTORICAL  02/01/2017   IR US GUIDE VASC ACCESS RIGHT 02/01/2017 Arne Cleveland, MD [facility]  . JOINT REPLACEMENT    . PERICARDIAL FLUID DRAINAGE  09/21/2016  . TOTAL HIP ARTHROPLASTY Left 05/23/2014   Procedure: TOTAL HIP ARTHROPLASTY;  Surgeon: Kerin Salen, MD;  Location: [facility];  Service: Orthopedics;  Laterality: Left;  . TOTAL HIP ARTHROPLASTY Right 05/15/2015   Procedure: TOTAL HIP ARTHROPLASTY;  Surgeon: Frederik Pear, MD;  Location: [facility];  Service: Orthopedics;  Laterality: Right;    REVIEW OF SYSTEMS:  A comprehensive review of systems was negative except for: Constitutional: positive for fatigue Ears, nose, mouth, throat, and face: positive for sore mouth   PHYSICAL EXAMINATION: General appearance: alert, cooperative, fatigued and no distress Head: Normocephalic, without obvious abnormality, atraumatic Neck: no adenopathy, no JVD, supple, symmetrical, trachea midline and thyroid not enlarged, symmetric, no tenderness/mass/nodules Lymph nodes: Cervical, supraclavicular, and axillary nodes normal. Resp: clear to auscultation bilaterally Back: symmetric, no curvature. ROM normal. No CVA tenderness. Cardio: regular rate and rhythm, S1, S2 normal, no murmur, click, rub or gallop GI: soft, non-tender; bowel sounds normal; no masses,  no organomegaly Extremities: extremities normal, atraumatic, no cyanosis or edema  ECOG PERFORMANCE STATUS: 1 - Symptomatic but completely ambulatory  Blood pressure 139/60, pulse 82, temperature 97.8 F (36.6 C), temperature source Oral, resp. rate 18, height 5\' 11"  (1.803 m), weight 208 lb 9.6 oz (94.6 kg), SpO2 100 %.  LABORATORY DATA: Lab Results  Component Value Date   WBC 4.1 04/28/2017   HGB 12.9 (L) 04/28/2017   HCT 38.4 04/28/2017   MCV 100.1 (H) 04/28/2017   PLT 178 04/28/2017      Chemistry      Component Value Date/Time   NA 142 04/28/2017 1056   K 3.5  04/28/2017 1056   CL 105 03/30/2017 1859   CO2 22 04/28/2017 1056   BUN 13.6 04/28/2017 1056   CREATININE 1.0 04/28/2017 1056      Component Value Date/Time   CALCIUM 8.8 04/28/2017 1056   ALKPHOS 69 04/28/2017 1056   AST 25 04/28/2017 1056   ALT 42 04/28/2017 1056   BILITOT 0.31 04/28/2017 1056       RADIOGRAPHIC STUDIES: Dg Chest 2 View  Result Date: 03/30/2017 CLINICAL DATA:  History of small cell lung cancer. Shortness of breath EXAM: CHEST  2 VIEW COMPARISON:  Chest CT 02/15/2017 FINDINGS: Normal heart size and stable mediastinal contours. There is left hilar adenopathy by CT. Porta catheter on the right with tip at the SVC level. Known left-sided lung nodule. There is no edema, consolidation, effusion, or pneumothorax. IMPRESSION: 1. No acute finding. 2. Known left lung nodule. Electronically Signed   By: Monte Fantasia M.D.   On: 03/30/2017 19:42   Ct Angio Chest W/cm &/or Wo Cm  Result Date: 03/30/2017 CLINICAL DATA:  Shortness of breath. Chemotherapy 2 weeks ago for lung cancer. EXAM: CT ANGIOGRAPHY CHEST WITH CONTRAST TECHNIQUE: Multidetector CT imaging of the chest was performed using  the standard protocol during bolus administration of intravenous contrast. Multiplanar CT image reconstructions and MIPs were obtained to evaluate the vascular anatomy. CONTRAST:  84 mL Isovue 370 IV COMPARISON:  02/15/2017, 12/09/2016 and PET-CT 10/05/2016 FINDINGS: Cardiovascular: Heart is normal size. Thoracic aorta is within normal. No evidence of pulmonary emboli. Mediastinum/Nodes: Interval decrease in size of left hilar adenopathy measuring 1 cm by short axis (previously 1.4 cm). No mediastinal or right hilar adenopathy. There is an air-fluid level over the proximal to mid esophagus likely due to dysmotility or reflux. Lungs/Pleura: Lungs are adequately inflated. There are mild emphysematous changes. There has been slight interval decrease in size of the subpleural nodule over the left lower lobe  measuring 0.8 x 1.2 cm (previously 0.8 x 2.1 cm). The previously seen adjacent 7 mm nodule in the left lower lobe is not visualized. No new nodules identified. No consolidation or effusion. Airways are within normal. Upper Abdomen: Stable 1.4 cm hypodensity over the left lobe of the liver not hypermetabolic on previous PET-CT. Stable right renal cysts. Musculoskeletal: Mild to moderate degenerate change of the spine. Review of the MIP images confirms the above findings. IMPRESSION: No evidence of pulmonary embolism. No acute cardiopulmonary disease. Interval decrease in size of left lower lobe subpleural nodule measuring 0.8 x 1.2 cm (previously 0.8 x 2.1 cm) previously seen adjacent 7 mm left lower lobe nodule resolved. Decreased left hilar adenopathy. Mild emphysematous disease. Air-fluid level over the mid esophagus likely due to dysmotility or reflux. Stable 1.4 cm left liver hypodensity.  Right renal cysts. Electronically Signed   By: Marin Olp M.D.   On: 03/30/2017 21:17    ASSESSMENT AND PLAN:  This is a very pleasant 71 years old white male with a stage IV non-small cell lung cancer, adenocarcinoma with PDL 1 expression of 80%. He underwent treatment with [name] for 6 cycles discontinued secondary to disease progression. He is currently on treatment with systemic chemotherapy was carboplatin, Alimta and Avastin status post 3 cycles. He continues to tolerate his treatment well except for fatigue and mouth sores. I recommended for him to proceed with cycle #4 today as a scheduled. For now shows, he will continue his treatment with Biotene on saltwater as well as Magic mouthwash. I will see the patient back for follow-up visit in 3 weeks for evaluation before starting cycle #5. He was advised to call immediately if he has any concerning symptoms in the interval. The patient voices understanding of current disease status and treatment options and is in agreement with the current care plan. All  questions were answered. The patient knows to call the clinic with any problems, questions or concerns. We can certainly see the patient much sooner if necessary. I spent 10 minutes counseling the patient face to face. The total time spent in the appointment was 15 minutes.  Disclaimer: This note was dictated with voice recognition software. Similar sounding words can inadvertently be transcribed and may not be corrected upon review.

## 2017-04-28 NOTE — Patient Instructions (Signed)
[facility] Discharge Instructions for Patients Receiving Chemotherapy  Today you received the following chemotherapy agents: Avastin, Alimta and Carboplatin   To help prevent nausea and vomiting after your treatment, we encourage you to take your nausea medication as directed.    If you develop nausea and vomiting that is not controlled by your nausea medication, call the clinic.   BELOW ARE SYMPTOMS THAT SHOULD BE REPORTED IMMEDIATELY:  *FEVER GREATER THAN 100.5 F  *CHILLS WITH OR WITHOUT FEVER  NAUSEA AND VOMITING THAT IS NOT CONTROLLED WITH YOUR NAUSEA MEDICATION  *UNUSUAL SHORTNESS OF BREATH  *UNUSUAL BRUISING OR BLEEDING  TENDERNESS IN MOUTH AND THROAT WITH OR WITHOUT PRESENCE OF ULCERS  *URINARY PROBLEMS  *BOWEL PROBLEMS  UNUSUAL RASH Items with * indicate a potential emergency and should be followed up as soon as possible.  Feel free to call the clinic you have any questions or concerns. The clinic phone number is [phone].  Please show the [facility] at check-in to the Emergency Department and triage nurse.

## 2017-04-28 NOTE — Telephone Encounter (Signed)
Message was sent to Nicholas Hale yesterday to address, but today he is out of the office.  Pt would like to pick up a copy of this today.  DOD please advise. Thanks   CY could you please advise. Thanks

## 2017-04-28 NOTE — Telephone Encounter (Signed)
Per patient request, cancel all [facility] appointments not associated with Treatment/Chemo.  No additional appointments added today. Appointments scheduled throughout July. Patient was given a copy of the AVS report and appointment schedule, per 04/28/17 los.

## 2017-05-05 ENCOUNTER — Other Ambulatory Visit (HOSPITAL_BASED_OUTPATIENT_CLINIC_OR_DEPARTMENT_OTHER): Payer: Medicare Other

## 2017-05-05 ENCOUNTER — Telehealth: Payer: Self-pay | Admitting: *Deleted

## 2017-05-05 DIAGNOSIS — C3432 Malignant neoplasm of lower lobe, left bronchus or lung: Secondary | ICD-10-CM | POA: Diagnosis present

## 2017-05-05 DIAGNOSIS — K121 Other forms of stomatitis: Secondary | ICD-10-CM

## 2017-05-05 DIAGNOSIS — C3492 Malignant neoplasm of unspecified part of left bronchus or lung: Secondary | ICD-10-CM

## 2017-05-05 LAB — CBC WITH DIFFERENTIAL/PLATELET
BASO%: 0.6 % (ref 0.0–2.0)
BASOS ABS: 0 10*3/uL (ref 0.0–0.1)
EOS%: 0.3 % (ref 0.0–7.0)
Eosinophils Absolute: 0 10*3/uL (ref 0.0–0.5)
HCT: 41.1 % (ref 38.4–49.9)
HGB: 13.8 g/dL (ref 13.0–17.1)
LYMPH%: 34.5 % (ref 14.0–49.0)
MCH: 33.5 pg — AB (ref 27.2–33.4)
MCHC: 33.5 g/dL (ref 32.0–36.0)
MCV: 99.9 fL — ABNORMAL HIGH (ref 79.3–98.0)
MONO#: 0.2 10*3/uL (ref 0.1–0.9)
MONO%: 6.3 % (ref 0.0–14.0)
NEUT#: 1.9 10*3/uL (ref 1.5–6.5)
NEUT%: 58.3 % (ref 39.0–75.0)
Platelets: 60 10*3/uL — ABNORMAL LOW (ref 140–400)
RBC: 4.11 10*6/uL — ABNORMAL LOW (ref 4.20–5.82)
RDW: 18.9 % — ABNORMAL HIGH (ref 11.0–14.6)
WBC: 3.3 10*3/uL — ABNORMAL LOW (ref 4.0–10.3)
lymph#: 1.1 10*3/uL (ref 0.9–3.3)

## 2017-05-05 LAB — COMPREHENSIVE METABOLIC PANEL
ALT: 52 U/L (ref 0–55)
AST: 31 U/L (ref 5–34)
Albumin: 3.5 g/dL (ref 3.5–5.0)
Alkaline Phosphatase: 73 U/L (ref 40–150)
Anion Gap: 6 mEq/L (ref 3–11)
BUN: 20.5 mg/dL (ref 7.0–26.0)
CO2: 26 mEq/L (ref 22–29)
Calcium: 8.6 mg/dL (ref 8.4–10.4)
Chloride: 105 mEq/L (ref 98–109)
Creatinine: 0.9 mg/dL (ref 0.7–1.3)
EGFR: 84 mL/min/{1.73_m2} — ABNORMAL LOW (ref >90)
GLUCOSE: 100 mg/dL (ref 70–140)
Potassium: 4.4 mEq/L (ref 3.5–5.1)
SODIUM: 137 meq/L (ref 136–145)
Total Bilirubin: 0.88 mg/dL (ref 0.20–1.20)
Total Protein: 6.5 g/dL (ref 6.4–8.3)

## 2017-05-05 MED ORDER — MAGIC MOUTHWASH: 5.0000 mL | Freq: Four times a day (QID) | ORAL | 1 refills | Status: DC

## 2017-05-05 NOTE — Telephone Encounter (Signed)
Called patient for verification of refill request found in refill errors folder that vanished with attempt to link to patient.  "I ran out of MMW last week.  Currently have thrush and would greatly appreciate this being refilled."    Called [facility] due to a refill should be on his profile.  "When this was called on 04-05-2017 for Prednisone, Mylanta, Benadryl in a 1:1:1 concentration, 240 ml, there were no refills were provided."     Refills provided with this call.

## 2017-05-12 ENCOUNTER — Other Ambulatory Visit (HOSPITAL_BASED_OUTPATIENT_CLINIC_OR_DEPARTMENT_OTHER): Payer: Medicare Other

## 2017-05-12 DIAGNOSIS — C3432 Malignant neoplasm of lower lobe, left bronchus or lung: Secondary | ICD-10-CM | POA: Diagnosis present

## 2017-05-12 DIAGNOSIS — C3492 Malignant neoplasm of unspecified part of left bronchus or lung: Secondary | ICD-10-CM

## 2017-05-12 LAB — COMPREHENSIVE METABOLIC PANEL
ALBUMIN: 3.2 g/dL — AB (ref 3.5–5.0)
ALT: 46 U/L (ref 0–55)
AST: 28 U/L (ref 5–34)
Alkaline Phosphatase: 74 U/L (ref 40–150)
Anion Gap: 7 mEq/L (ref 3–11)
BUN: 15.1 mg/dL (ref 7.0–26.0)
CHLORIDE: 108 meq/L (ref 98–109)
CO2: 27 mEq/L (ref 22–29)
Calcium: 8.8 mg/dL (ref 8.4–10.4)
Creatinine: 1 mg/dL (ref 0.7–1.3)
EGFR: 72 mL/min/{1.73_m2} — ABNORMAL LOW (ref >90)
Glucose: 119 mg/dl (ref 70–140)
Potassium: 4 mEq/L (ref 3.5–5.1)
Sodium: 141 mEq/L (ref 136–145)
Total Bilirubin: 0.34 mg/dL (ref 0.20–1.20)
Total Protein: 6.1 g/dL — ABNORMAL LOW (ref 6.4–8.3)

## 2017-05-12 LAB — CBC WITH DIFFERENTIAL/PLATELET
BASO%: 0.3 % (ref 0.0–2.0)
Basophils Absolute: 0 10*3/uL (ref 0.0–0.1)
EOS%: 1.9 % (ref 0.0–7.0)
Eosinophils Absolute: 0.1 10*3/uL (ref 0.0–0.5)
HCT: 35 % — ABNORMAL LOW (ref 38.4–49.9)
HEMOGLOBIN: 11.4 g/dL — AB (ref 13.0–17.1)
LYMPH%: 39.9 % (ref 14.0–49.0)
MCH: 33.6 pg — ABNORMAL HIGH (ref 27.2–33.4)
MCHC: 32.6 g/dL (ref 32.0–36.0)
MCV: 103.2 fL — ABNORMAL HIGH (ref 79.3–98.0)
MONO#: 0.3 10*3/uL (ref 0.1–0.9)
MONO%: 8.7 % (ref 0.0–14.0)
NEUT#: 1.6 10*3/uL (ref 1.5–6.5)
NEUT%: 49.2 % (ref 39.0–75.0)
Platelets: 46 10*3/uL — ABNORMAL LOW (ref 140–400)
RBC: 3.39 10*6/uL — ABNORMAL LOW (ref 4.20–5.82)
RDW: 17.9 % — AB (ref 11.0–14.6)
WBC: 3.2 10*3/uL — ABNORMAL LOW (ref 4.0–10.3)
lymph#: 1.3 10*3/uL (ref 0.9–3.3)

## 2017-05-12 LAB — UA PROTEIN, DIPSTICK - CHCC: PROTEIN: NEGATIVE mg/dL

## 2017-05-17 ENCOUNTER — Other Ambulatory Visit: Payer: Self-pay | Admitting: Emergency Medicine

## 2017-05-18 ENCOUNTER — Other Ambulatory Visit: Payer: Self-pay | Admitting: *Deleted

## 2017-05-19 ENCOUNTER — Other Ambulatory Visit (HOSPITAL_BASED_OUTPATIENT_CLINIC_OR_DEPARTMENT_OTHER): Payer: Medicare Other

## 2017-05-19 ENCOUNTER — Telehealth: Payer: Self-pay | Admitting: Internal Medicine

## 2017-05-19 ENCOUNTER — Ambulatory Visit (HOSPITAL_BASED_OUTPATIENT_CLINIC_OR_DEPARTMENT_OTHER): Payer: Medicare Other

## 2017-05-19 ENCOUNTER — Ambulatory Visit: Payer: Medicare Other

## 2017-05-19 ENCOUNTER — Ambulatory Visit (HOSPITAL_BASED_OUTPATIENT_CLINIC_OR_DEPARTMENT_OTHER): Payer: Medicare Other | Admitting: Internal Medicine

## 2017-05-19 ENCOUNTER — Encounter: Payer: Self-pay | Admitting: Internal Medicine

## 2017-05-19 VITALS — BP 155/75 | HR 79 | Temp 97.2°F | Resp 18 | Ht 71.0 in | Wt 199.6 lb

## 2017-05-19 VITALS — BP 145/79 | HR 67

## 2017-05-19 DIAGNOSIS — Z5111 Encounter for antineoplastic chemotherapy: Secondary | ICD-10-CM

## 2017-05-19 DIAGNOSIS — C3492 Malignant neoplasm of unspecified part of left bronchus or lung: Secondary | ICD-10-CM

## 2017-05-19 DIAGNOSIS — C3432 Malignant neoplasm of lower lobe, left bronchus or lung: Secondary | ICD-10-CM

## 2017-05-19 DIAGNOSIS — Z5112 Encounter for antineoplastic immunotherapy: Secondary | ICD-10-CM

## 2017-05-19 DIAGNOSIS — Z95828 Presence of other vascular implants and grafts: Secondary | ICD-10-CM

## 2017-05-19 LAB — COMPREHENSIVE METABOLIC PANEL
ALBUMIN: 3.5 g/dL (ref 3.5–5.0)
ALT: 42 U/L (ref 0–55)
AST: 25 U/L (ref 5–34)
Alkaline Phosphatase: 80 U/L (ref 40–150)
Anion Gap: 12 mEq/L — ABNORMAL HIGH (ref 3–11)
BILIRUBIN TOTAL: 0.45 mg/dL (ref 0.20–1.20)
BUN: 22.3 mg/dL (ref 7.0–26.0)
CO2: 21 meq/L — AB (ref 22–29)
Calcium: 9.3 mg/dL (ref 8.4–10.4)
Chloride: 106 mEq/L (ref 98–109)
Creatinine: 1.1 mg/dL (ref 0.7–1.3)
EGFR: 69 mL/min/{1.73_m2} — AB (ref >90)
GLUCOSE: 98 mg/dL (ref 70–140)
Potassium: 4.3 mEq/L (ref 3.5–5.1)
SODIUM: 139 meq/L (ref 136–145)
TOTAL PROTEIN: 7 g/dL (ref 6.4–8.3)

## 2017-05-19 LAB — CBC WITH DIFFERENTIAL/PLATELET
BASO%: 0.8 % (ref 0.0–2.0)
Basophils Absolute: 0 10*3/uL (ref 0.0–0.1)
EOS ABS: 0 10*3/uL (ref 0.0–0.5)
EOS%: 0 % (ref 0.0–7.0)
HCT: 36.7 % — ABNORMAL LOW (ref 38.4–49.9)
HEMOGLOBIN: 12.5 g/dL — AB (ref 13.0–17.1)
LYMPH%: 18.9 % (ref 14.0–49.0)
MCH: 35.2 pg — ABNORMAL HIGH (ref 27.2–33.4)
MCHC: 34.1 g/dL (ref 32.0–36.0)
MCV: 103.3 fL — AB (ref 79.3–98.0)
MONO#: 1.4 10*3/uL — ABNORMAL HIGH (ref 0.1–0.9)
MONO%: 23.1 % — AB (ref 0.0–14.0)
NEUT%: 57.2 % (ref 39.0–75.0)
NEUTROS ABS: 3.4 10*3/uL (ref 1.5–6.5)
Platelets: 238 10*3/uL (ref 140–400)
RBC: 3.55 10*6/uL — AB (ref 4.20–5.82)
RDW: 21.9 % — ABNORMAL HIGH (ref 11.0–14.6)
WBC: 5.9 10*3/uL (ref 4.0–10.3)
lymph#: 1.1 10*3/uL (ref 0.9–3.3)

## 2017-05-19 MED ORDER — SODIUM CHLORIDE 0.9 % IV SOLN
520.0000 mg | Freq: Once | INTRAVENOUS | Status: AC
  Administered 2017-05-19: 520 mg via INTRAVENOUS
  Filled 2017-05-19: qty 52

## 2017-05-19 MED ORDER — PALONOSETRON HCL INJECTION 0.25 MG/5ML
INTRAVENOUS | Status: AC
  Filled 2017-05-19: qty 5

## 2017-05-19 MED ORDER — SODIUM CHLORIDE 0.9% FLUSH
10.0000 mL | INTRAVENOUS | Status: DC | PRN
  Administered 2017-05-19: 10 mL
  Filled 2017-05-19: qty 10

## 2017-05-19 MED ORDER — SODIUM CHLORIDE 0.9% FLUSH
10.0000 mL | INTRAVENOUS | Status: DC | PRN
  Administered 2017-05-19: 10 mL via INTRAVENOUS
  Filled 2017-05-19: qty 10

## 2017-05-19 MED ORDER — PALONOSETRON HCL INJECTION 0.25 MG/5ML
0.2500 mg | Freq: Once | INTRAVENOUS | Status: AC
  Administered 2017-05-19: 0.25 mg via INTRAVENOUS

## 2017-05-19 MED ORDER — SODIUM CHLORIDE 0.9 % IV SOLN
Freq: Once | INTRAVENOUS | Status: AC
  Administered 2017-05-19: 12:00:00 via INTRAVENOUS

## 2017-05-19 MED ORDER — SODIUM CHLORIDE 0.9 % IV SOLN
Freq: Once | INTRAVENOUS | Status: AC
  Administered 2017-05-19: 12:00:00 via INTRAVENOUS
  Filled 2017-05-19: qty 5

## 2017-05-19 MED ORDER — HEPARIN SOD (PORK) LOCK FLUSH 100 UNIT/ML IV SOLN
500.0000 [IU] | Freq: Once | INTRAVENOUS | Status: AC | PRN
  Administered 2017-05-19: 500 [IU]
  Filled 2017-05-19: qty 5

## 2017-05-19 MED ORDER — SODIUM CHLORIDE 0.9 % IV SOLN
14.7000 mg/kg | Freq: Once | INTRAVENOUS | Status: AC
  Administered 2017-05-19: 1400 mg via INTRAVENOUS
  Filled 2017-05-19: qty 48

## 2017-05-19 MED ORDER — SODIUM CHLORIDE 0.9 % IV SOLN
500.0000 mg/m2 | Freq: Once | INTRAVENOUS | Status: AC
  Administered 2017-05-19: 1100 mg via INTRAVENOUS
  Filled 2017-05-19: qty 40

## 2017-05-19 NOTE — Patient Instructions (Signed)
[facility] Discharge Instructions for Patients Receiving Chemotherapy  Today you received the following chemotherapy agents :  Avastin,  Alimta, Carboplatin.  To help prevent nausea and vomiting after your treatment, we encourage you to take your nausea medication as prescribed.   If you develop nausea and vomiting that is not controlled by your nausea medication, call the clinic.   BELOW ARE SYMPTOMS THAT SHOULD BE REPORTED IMMEDIATELY:  *FEVER GREATER THAN 100.5 F  *CHILLS WITH OR WITHOUT FEVER  NAUSEA AND VOMITING THAT IS NOT CONTROLLED WITH YOUR NAUSEA MEDICATION  *UNUSUAL SHORTNESS OF BREATH  *UNUSUAL BRUISING OR BLEEDING  TENDERNESS IN MOUTH AND THROAT WITH OR WITHOUT PRESENCE OF ULCERS  *URINARY PROBLEMS  *BOWEL PROBLEMS  UNUSUAL RASH Items with * indicate a potential emergency and should be followed up as soon as possible.  Feel free to call the clinic you have any questions or concerns. The clinic phone number is [phone].  Please show the [facility] at check-in to the Emergency Department and triage nurse.

## 2017-05-19 NOTE — Progress Notes (Signed)
[facility] Telephone:([phone]   [phone]  OFFICE PROGRESS NOTE  Rankins, [name], MD [place] [address]  DIAGNOSIS: Stage IV (T1a, N3, M1b) non-small cell lung cancer, adenocarcinoma with PDL 1 expression of 80%. 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  PRIOR THERAPY: Treatment with immunotherapy with [name] (pembrolizumab) 200 MG IV every 3 weeks status post 6 cycles discontinued secondary to disease progression..  CURRENT THERAPY: Systemic chemotherapy with carboplatin for AUC of 5, Alimta 500 MG/M2 and Avastin 15 MG/KG every 3 weeks. First dose 02/24/2017. Status post 4 cycles.  INTERVAL HISTORY: Nicholas Hale 71 y.o. male returns to the clinic today for follow-up visit. The patient is feeling fine today with no specific complaints except for increasing shortness breath with exertion. He denied having any chest pain, cough or hemoptysis. He denied having any fever or chills. He had a dental procedure performed last week and the stitches were removed few days ago. He denied having any nausea, vomiting, diarrhea or constipation. He is here today for evaluation before starting cycle #5 of his systemic chemotherapy.   MEDICAL HISTORY: Past Medical History:  Diagnosis Date  . Abdominal aortic aneurysm ([facility])   . Arthritis    "hips; lower spine" (05/24/2014)  . Chemotherapy induced neutropenia ([facility]) 03/31/2017  . Chronic fatigue 10/05/2016  . Encounter for antineoplastic chemotherapy 02/17/2017  . Hyperlipidemia   . Hypertension   . Lung cancer ([facility]) 09/25/2016  . PE (pulmonary embolism) 9/15  . Pericardial effusion 09/20/2016   Malignant effusion s/p pericardial drain  . Personal history of DVT (deep vein thrombosis) 9/15    ALLERGIES:  has  No Known Allergies.  MEDICATIONS:  Current Outpatient Prescriptions  Medication Sig Dispense Refill  . amoxicillin (AMOXIL) 500 MG capsule Take 500 mg by mouth 3 (three) times daily.    Marland Kitchen atorvastatin (LIPITOR) 20 MG tablet Take 1 tablet (20 mg total) by mouth daily at 6 PM. 90 tablet 3  . chlorhexidine (PERIDEX) 0.12 % solution 5 mLs by Mouth Rinse route 2 (two) times daily.    Marland Kitchen dexamethasone (DECADRON) 4 MG tablet 4 mg by mouth twice a day the day before, day of and day after the chemotherapy every 3 weeks. (Patient not taking: Reported on 04/07/2017) 40 tablet 1  . diphenhydrAMINE (BENADRYL) 25 mg capsule Take 25 mg by mouth every 6 (six) hours as needed.    . folic acid (FOLVITE) 1 MG tablet Take 1 tablet (1 mg total) by mouth daily. 30 tablet 1  . lidocaine-prilocaine (EMLA) cream Apply 1 application topically as needed. 30 g 0  . magic mouthwash SOLN Take 5 mLs by mouth 4 (four) times daily. Hydrocortisone 60 mg ,. Nystatin 30 ml Benadryl 12.5 mg/5 ml. Mix to 240 ml 240 mL 1  . prochlorperazine (COMPAZINE) 10 MG tablet Take 1 tablet (10 mg total) by mouth every 6 (six) hours as needed for nausea or vomiting. (Patient not taking: Reported on 04/07/2017) 30 tablet 0  . Tiotropium Bromide Monohydrate (SPIRIVA RESPIMAT) 2.5 MCG/ACT AERS Inhale 2 puffs into the lungs daily. 3 Inhaler 0  . XARELTO 20 MG TABS tablet TAKE 1 TABLET BY MOUTH EVERY DAY 30 tablet 5   No current facility-administered medications for this visit.     SURGICAL HISTORY:  Past Surgical History:  Procedure  Laterality Date  . ANTERIOR FUSION CERVICAL SPINE  ~ 2005  . APPENDECTOMY  1978  . CARDIAC CATHETERIZATION N/A 09/21/2016   Procedure: Pericardiocentesis;  Surgeon: Jettie Booze, MD;  Location: [facility];  Service: Cardiovascular;  Laterality: N/A;  . COLONOSCOPY    . HEMIARTHROPLASTY SHOULDER FRACTURE Left 12/2008  . INGUINAL HERNIA REPAIR Bilateral 1992  . IR GENERIC HISTORICAL  02/01/2017   IR FLUORO  GUIDE PORT INSERTION RIGHT 02/01/2017 Arne Cleveland, MD [facility]  . IR GENERIC HISTORICAL  02/01/2017   IR US GUIDE VASC ACCESS RIGHT 02/01/2017 Arne Cleveland, MD [facility]  . JOINT REPLACEMENT    . PERICARDIAL FLUID DRAINAGE  09/21/2016  . TOTAL HIP ARTHROPLASTY Left 05/23/2014   Procedure: TOTAL HIP ARTHROPLASTY;  Surgeon: Kerin Salen, MD;  Location: [facility];  Service: Orthopedics;  Laterality: Left;  . TOTAL HIP ARTHROPLASTY Right 05/15/2015   Procedure: TOTAL HIP ARTHROPLASTY;  Surgeon: Frederik Pear, MD;  Location: [facility];  Service: Orthopedics;  Laterality: Right;    REVIEW OF SYSTEMS:  A comprehensive review of systems was negative except for: Respiratory: positive for dyspnea on exertion   PHYSICAL EXAMINATION: General appearance: alert, cooperative, fatigued and no distress Head: Normocephalic, without obvious abnormality, atraumatic Neck: no adenopathy, no JVD, supple, symmetrical, trachea midline and thyroid not enlarged, symmetric, no tenderness/mass/nodules Lymph nodes: Cervical, supraclavicular, and axillary nodes normal. Resp: clear to auscultation bilaterally Back: symmetric, no curvature. ROM normal. No CVA tenderness. Cardio: regular rate and rhythm, S1, S2 normal, no murmur, click, rub or gallop GI: soft, non-tender; bowel sounds normal; no masses,  no organomegaly Extremities: extremities normal, atraumatic, no cyanosis or edema  ECOG PERFORMANCE STATUS: 1 - Symptomatic but completely ambulatory  Blood pressure (!) 155/75, pulse 79, temperature 97.2 F (36.2 C), temperature source Oral, resp. rate 18, height 5\' 11"  (1.803 m), weight 199 lb 9.6 oz (90.5 kg), SpO2 100 %.  LABORATORY DATA: Lab Results  Component Value Date   WBC 5.9 05/19/2017   HGB 12.5 (L) 05/19/2017   HCT 36.7 (L) 05/19/2017   MCV 103.3 (H) 05/19/2017   PLT 238 05/19/2017      Chemistry      Component Value Date/Time   NA 139 05/19/2017 0914   K 4.3 05/19/2017 0914   CL 105 03/30/2017  1859   CO2 21 (L) 05/19/2017 0914   BUN 22.3 05/19/2017 0914   CREATININE 1.1 05/19/2017 0914      Component Value Date/Time   CALCIUM 9.3 05/19/2017 0914   ALKPHOS 80 05/19/2017 0914   AST 25 05/19/2017 0914   ALT 42 05/19/2017 0914   BILITOT 0.45 05/19/2017 0914       RADIOGRAPHIC STUDIES: No results found.  ASSESSMENT AND PLAN:  This is a very pleasant 71 years old white male with stage IV non-small cell lung cancer, adenocarcinoma with positive PDL 1 expression of 80% status post 6 cycles of treatment with [place] discontinued secondary to disease progression. He is currently on systemic chemotherapy with carboplatin, Alimta and Avastin status post 4 cycles and tolerating this treatment fairly well. I recommended for him to proceed with cycle #5 today as scheduled. I will see him back for follow-up visit in 3 weeks for evaluation before the next cycle of his treatment. He was advised to call immediately if he has any other concerning symptoms in the interval. The patient voices understanding of current disease status and treatment options and is in agreement with the current care plan.  All questions were answered. The patient knows to call the clinic with any problems, questions or concerns. We can certainly see the patient much sooner if necessary. I spent 10 minutes counseling the patient face to face. The total time spent in the appointment was 15 minutes.  Disclaimer: This note was dictated with voice recognition software. Similar sounding words can inadvertently be transcribed and may not be corrected upon review.

## 2017-05-19 NOTE — Telephone Encounter (Signed)
Appts already scheduled appt per 6/20 los - all appts scheduled per treatment plan - no additional appts added.

## 2017-05-26 ENCOUNTER — Other Ambulatory Visit (HOSPITAL_BASED_OUTPATIENT_CLINIC_OR_DEPARTMENT_OTHER): Payer: Medicare Other

## 2017-05-26 DIAGNOSIS — C3432 Malignant neoplasm of lower lobe, left bronchus or lung: Secondary | ICD-10-CM | POA: Diagnosis present

## 2017-05-26 DIAGNOSIS — C3492 Malignant neoplasm of unspecified part of left bronchus or lung: Secondary | ICD-10-CM

## 2017-05-26 LAB — CBC WITH DIFFERENTIAL/PLATELET
BASO%: 0.8 % (ref 0.0–2.0)
BASOS ABS: 0 10*3/uL (ref 0.0–0.1)
EOS ABS: 0 10*3/uL (ref 0.0–0.5)
EOS%: 0.2 % (ref 0.0–7.0)
HCT: 39.6 % (ref 38.4–49.9)
HGB: 13.4 g/dL (ref 13.0–17.1)
LYMPH%: 40.6 % (ref 14.0–49.0)
MCH: 35.1 pg — AB (ref 27.2–33.4)
MCHC: 33.9 g/dL (ref 32.0–36.0)
MCV: 103.6 fL — AB (ref 79.3–98.0)
MONO#: 0.1 10*3/uL (ref 0.1–0.9)
MONO%: 4.9 % (ref 0.0–14.0)
NEUT#: 1.5 10*3/uL (ref 1.5–6.5)
NEUT%: 53.5 % (ref 39.0–75.0)
PLATELETS: 117 10*3/uL — AB (ref 140–400)
RBC: 3.82 10*6/uL — AB (ref 4.20–5.82)
RDW: 20.4 % — ABNORMAL HIGH (ref 11.0–14.6)
WBC: 2.9 10*3/uL — ABNORMAL LOW (ref 4.0–10.3)
lymph#: 1.2 10*3/uL (ref 0.9–3.3)

## 2017-05-26 LAB — COMPREHENSIVE METABOLIC PANEL
ALT: 54 U/L (ref 0–55)
ANION GAP: 12 meq/L — AB (ref 3–11)
AST: 32 U/L (ref 5–34)
Albumin: 3.4 g/dL — ABNORMAL LOW (ref 3.5–5.0)
Alkaline Phosphatase: 75 U/L (ref 40–150)
BUN: 20.2 mg/dL (ref 7.0–26.0)
CHLORIDE: 105 meq/L (ref 98–109)
CO2: 26 meq/L (ref 22–29)
Calcium: 8.9 mg/dL (ref 8.4–10.4)
Creatinine: 1 mg/dL (ref 0.7–1.3)
EGFR: 73 mL/min/{1.73_m2} — AB (ref >90)
Glucose: 113 mg/dl (ref 70–140)
POTASSIUM: 5 meq/L (ref 3.5–5.1)
Sodium: 143 mEq/L (ref 136–145)
Total Bilirubin: 0.88 mg/dL (ref 0.20–1.20)
Total Protein: 6.7 g/dL (ref 6.4–8.3)

## 2017-06-03 ENCOUNTER — Ambulatory Visit (INDEPENDENT_AMBULATORY_CARE_PROVIDER_SITE_OTHER): Payer: Medicare Other | Admitting: Internal Medicine

## 2017-06-03 ENCOUNTER — Encounter: Payer: Self-pay | Admitting: Internal Medicine

## 2017-06-03 ENCOUNTER — Other Ambulatory Visit (HOSPITAL_BASED_OUTPATIENT_CLINIC_OR_DEPARTMENT_OTHER): Payer: Medicare Other

## 2017-06-03 VITALS — BP 128/70 | HR 78 | Ht 70.0 in | Wt 202.0 lb

## 2017-06-03 DIAGNOSIS — I3139 Other pericardial effusion (noninflammatory): Secondary | ICD-10-CM

## 2017-06-03 DIAGNOSIS — R0602 Shortness of breath: Secondary | ICD-10-CM | POA: Diagnosis not present

## 2017-06-03 DIAGNOSIS — C3492 Malignant neoplasm of unspecified part of left bronchus or lung: Secondary | ICD-10-CM

## 2017-06-03 DIAGNOSIS — I313 Pericardial effusion (noninflammatory): Secondary | ICD-10-CM | POA: Diagnosis not present

## 2017-06-03 DIAGNOSIS — C3432 Malignant neoplasm of lower lobe, left bronchus or lung: Secondary | ICD-10-CM | POA: Diagnosis present

## 2017-06-03 LAB — CBC WITH DIFFERENTIAL/PLATELET
BASO%: 0.3 % (ref 0.0–2.0)
BASOS ABS: 0 10*3/uL (ref 0.0–0.1)
EOS ABS: 0 10*3/uL (ref 0.0–0.5)
EOS%: 0.5 % (ref 0.0–7.0)
HEMATOCRIT: 33.4 % — AB (ref 38.4–49.9)
HGB: 11 g/dL — ABNORMAL LOW (ref 13.0–17.1)
LYMPH%: 32.4 % (ref 14.0–49.0)
MCH: 35.1 pg — AB (ref 27.2–33.4)
MCHC: 33 g/dL (ref 32.0–36.0)
MCV: 106.4 fL — ABNORMAL HIGH (ref 79.3–98.0)
MONO#: 0.7 10*3/uL (ref 0.1–0.9)
MONO%: 22 % — ABNORMAL HIGH (ref 0.0–14.0)
NEUT#: 1.4 10*3/uL — ABNORMAL LOW (ref 1.5–6.5)
NEUT%: 44.8 % (ref 39.0–75.0)
PLATELETS: 59 10*3/uL — AB (ref 140–400)
RBC: 3.14 10*6/uL — ABNORMAL LOW (ref 4.20–5.82)
RDW: 21.2 % — ABNORMAL HIGH (ref 11.0–14.6)
WBC: 3.1 10*3/uL — ABNORMAL LOW (ref 4.0–10.3)
lymph#: 1 10*3/uL (ref 0.9–3.3)

## 2017-06-03 LAB — COMPREHENSIVE METABOLIC PANEL
ALBUMIN: 3.3 g/dL — AB (ref 3.5–5.0)
ALK PHOS: 79 U/L (ref 40–150)
ALT: 45 U/L (ref 0–55)
AST: 30 U/L (ref 5–34)
Anion Gap: 10 mEq/L (ref 3–11)
BUN: 12.7 mg/dL (ref 7.0–26.0)
CO2: 25 mEq/L (ref 22–29)
Calcium: 9 mg/dL (ref 8.4–10.4)
Chloride: 108 mEq/L (ref 98–109)
Creatinine: 1 mg/dL (ref 0.7–1.3)
EGFR: 74 mL/min/{1.73_m2} — ABNORMAL LOW (ref >90)
GLUCOSE: 95 mg/dL (ref 70–140)
POTASSIUM: 4.3 meq/L (ref 3.5–5.1)
SODIUM: 143 meq/L (ref 136–145)
TOTAL PROTEIN: 6.5 g/dL (ref 6.4–8.3)
Total Bilirubin: 0.38 mg/dL (ref 0.20–1.20)

## 2017-06-03 LAB — UA PROTEIN, DIPSTICK - CHCC: Protein, ur: NEGATIVE mg/dL

## 2017-06-03 NOTE — Patient Instructions (Signed)
Medication Instructions:  1. Your physician recommends that you continue on your current medications as directed. Please refer to the Current Medication list given to you today.   Labwork: NONE [facility]DR[facility]  Testing/Procedures: 1. Your physician has requested that you have an echocardiogram. Echocardiography is a painless test that uses sound waves to create images of your heart. It provides your doctor with information about the size and shape of your heart and how well your heart's chambers and valves are working. This procedure takes approximately one hour. There are no restrictions for this procedure. DX SOB, PERICARDIAL EFFUSION     Follow-Up: Your physician wants you to follow-up in: [place] DR[address] END You will receive a reminder letter in the mail two months in advance. If you don't receive a letter, please call our office to schedule the follow-up appointment.    Any Other Special Instructions Will Be Listed Below (If Applicable).     If you need a refill on your cardiac medications before your next appointment, please call your pharmacy.

## 2017-06-03 NOTE — Progress Notes (Signed)
Follow-up Outpatient Visit Date: 06/03/2017  Primary Care Provider: [name], [place] [address]  Chief Complaint: Shortness of breath  HPI:  [name] is a 71 y.o. year-old male with history of recurrent pulmonary emboli, hypertension, hyperlipidemia, and malignant pericardial effusion presenting with cardiac And not requiring urgent pericardiocentesis and 08/2016, who presents for follow-up of pericardial disease. Cytology from the pleural fluid revealed malignant cells, ultimately leading to the diagnosis of non-small cell lung cancer. [name] has been receiving chemotherapy under the direction of Dr. [name]. I last saw him on 12/11/16, which time he was doing well. Unfortunately, since that time pembrolizumab was discontinued after 6 cycles due to disease progression. He has subtotally completed 4 cycles of carboplatin, Alimta, and Avastin.  Today, [name] reports significant shortness of breath, which has started since he began second line chemotherapy. He was mild activity such as walking from one room to another, he becomes significant shortness of breath. He has not had any chest pain, palpitations, lightheadedness, orthopnea, PND, and edema. He notes that his legs feel "very heavy" when he is moving around. He has not had any active bleeding, though he occasionally will notice blood clots when he blows his nose. He remains on Xarelto.  --------------------------------------------------------------------------------------------------  Cardiovascular History & Procedures: Cardiovascular Problems:  Malignant pericardial effusion  Recurrent pulmonary emboli  Risk Factors:  Hypertension, hyperlipidemia, male gender, and age greater than 9  Cath/PCI:  Pericardiocentesis (09/21/16): Successful pericardiocentesis with removal of 860 mL of serosanguineous fluid and resolution of pericardial effusion by echo.  CV Surgery:  None  EP  Procedures and Devices:  None  Non-Invasive Evaluation(s):  CTA chest (03/30/17): No PE or acute cardiopulmonary disease. Decreased size of left lower lobe subpleural nodule. No evidence of pericardial effusion. Mild emphysema. 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 Stable 1.4 cm liver hypodensity and right renal cysts.  TTE (09/20/16): Large pericardial effusion with signs of early And not. LVEF 60-65% with normal wall motion. No significant valvular abnormalities.  Limited TTE (08/31/16): No significant pericardial effusion. Normal LVEF (55-60%).  Recent CV Pertinent Labs: Lab Results  Component Value Date   CHOL 94 09/20/2016   HDL 23 (L) 09/20/2016   LDLCALC 50 09/20/2016   TRIG 106 09/20/2016   CHOLHDL 4.1 09/20/2016   INR 1.04 02/01/2017   K 4.3 06/03/2017   MG 2.0 09/19/2016   BUN 12.7 06/03/2017   CREATININE 1.0 06/03/2017    Past medical and surgical history were reviewed and updated in EPIC.  Current Meds  Medication Sig  . atorvastatin (LIPITOR) 20 MG tablet Take 1 tablet (20 mg total) by mouth daily at 6 PM.  . chlorhexidine (PERIDEX) 0.12 % solution 5 mLs by Mouth Rinse route 2 (two) times daily.  Marland Kitchen dexamethasone (DECADRON) 4 MG tablet 4 mg by mouth twice a day the day before, day of and day after the chemotherapy every 3 weeks.  . diphenhydrAMINE (BENADRYL) 25 mg capsule Take 25 mg by mouth every 6 (six) hours as needed.  . folic acid (FOLVITE) 1 MG tablet Take 1 tablet (1 mg total) by mouth daily.  Marland Kitchen lidocaine-prilocaine (EMLA) cream Apply 1 application topically 3 times/day as needed-between meals & bedtime (on port site).  . magic mouthwash SOLN Take 5 mLs by mouth 4 (four) times daily. Hydrocortisone 60 mg ,. Nystatin 30 ml Benadryl 12.5 mg/5 ml. Mix to 240 ml  . prochlorperazine (COMPAZINE) 10 MG tablet Take 1 tablet (10 mg total) by mouth  every 6 (six) hours as needed for nausea or vomiting.  . Tiotropium Bromide Monohydrate (SPIRIVA RESPIMAT) 2.5 MCG/ACT AERS  Inhale 2 puffs into the lungs daily.  Alveda Reasons 20 MG TABS tablet TAKE 1 TABLET BY MOUTH EVERY DAY  . [DISCONTINUED] amoxicillin (AMOXIL) 500 MG capsule Take 500 mg by mouth 3 (three) times daily.  . [DISCONTINUED] lidocaine-prilocaine (EMLA) cream Apply 1 application topically as needed.    Allergies: Patient has no known allergies.  Social History   Social History  . Marital status: Married    Spouse name: N/A  . Number of children: N/A  . Years of education: N/A   Occupational History  . Not on file.   Social History Main Topics  . Smoking status: Former Smoker    Packs/day: 0.50    Years: 40.00    Types: Cigarettes    Quit date: 2010  . Smokeless tobacco: Never Used  . Alcohol use No  . Drug use: No  . Sexual activity: No   Other Topics Concern  . Not on file   Social History Narrative  . No narrative on file    Family History  Problem Relation Age of Onset  . Clotting disorder Mother   . Diabetes Mellitus I Mother   . Prostate cancer Father     Review of Systems: A 12-system review of systems was performed and was negative except as noted in the HPI.  --------------------------------------------------------------------------------------------------  Physical Exam: BP 128/70   Pulse 78   Ht 5\' 10"  (1.778 m)   Wt 202 lb (91.6 kg)   BMI 28.98 kg/m   General:  Overweight but chronically ill-appearing man, seated comfortably in the exam room. HEENT: No conjunctival pallor or scleral icterus. Moist mucous membranes.  OP clear. Neck: Supple without lymphadenopathy, thyromegaly, JVD, or HJR. No carotid bruit. Lungs: Normal work of breathing. Mildly diminished breath sounds throughout without wheezes or crackles. Heart: Regular rate and rhythm without murmurs, rubs, or gallops. Non-displaced PMI. Abd: Bowel sounds present. Soft, NT/ND without hepatosplenomegaly Ext: No lower extremity edema. Radial, PT, and DP pulses are 2+ bilaterally. Skin: Warm and dry  without rash.  EKG:  Normal sinus rhythm without abnormalities.  Lab Results  Component Value Date   WBC 3.1 (L) 06/03/2017   HGB 11.0 (L) 06/03/2017   HCT 33.4 (L) 06/03/2017   MCV 106.4 (H) 06/03/2017   PLT 59 (L) 06/03/2017    Lab Results  Component Value Date   NA 143 06/03/2017   K 4.3 06/03/2017   CL 105 03/30/2017   CO2 25 06/03/2017   BUN 12.7 06/03/2017   CREATININE 1.0 06/03/2017   GLUCOSE 95 06/03/2017   ALT 45 06/03/2017    Lab Results  Component Value Date   CHOL 94 09/20/2016   HDL 23 (L) 09/20/2016   LDLCALC 50 09/20/2016   TRIG 106 09/20/2016   CHOLHDL 4.1 09/20/2016    --------------------------------------------------------------------------------------------------  ASSESSMENT AND PLAN: Shortness of breath I suspect this is most likely due to side effects from chemotherapy. Mr. Verbrugge appears euvolemic on exam today. However, given his recent onset of considerable dyspnea, we have agreed to obtain a transthoracic echocardiogram to exclude decline in LVEF for reaccumulation of pericardial fluid.  Pericardial effusion Cancer diagnosis initially made by cytology from pericardiocentesis last year. Follow-up echoes and CT of the chest as recently as 03/2017 showed no significant pericardial effusion. However, given progressive shortness of breath, we have agreed to repeat an echo, as above.  Follow-up:  Return to clinic in 6 months.  Nelva Bush, MD 06/04/2017 7:26 AM

## 2017-06-04 ENCOUNTER — Encounter: Payer: Self-pay | Admitting: Internal Medicine

## 2017-06-08 ENCOUNTER — Other Ambulatory Visit: Payer: Self-pay | Admitting: Medical Oncology

## 2017-06-08 DIAGNOSIS — K121 Other forms of stomatitis: Secondary | ICD-10-CM

## 2017-06-08 MED ORDER — MAGIC MOUTHWASH: 5.0000 mL | Freq: Four times a day (QID) | ORAL | 0 refills | Status: DC

## 2017-06-09 ENCOUNTER — Encounter: Payer: Self-pay | Admitting: Internal Medicine

## 2017-06-09 ENCOUNTER — Other Ambulatory Visit (HOSPITAL_BASED_OUTPATIENT_CLINIC_OR_DEPARTMENT_OTHER): Payer: Medicare Other

## 2017-06-09 ENCOUNTER — Telehealth: Payer: Self-pay | Admitting: Internal Medicine

## 2017-06-09 ENCOUNTER — Ambulatory Visit: Payer: Medicare Other

## 2017-06-09 ENCOUNTER — Ambulatory Visit (HOSPITAL_BASED_OUTPATIENT_CLINIC_OR_DEPARTMENT_OTHER): Payer: Medicare Other

## 2017-06-09 ENCOUNTER — Ambulatory Visit (HOSPITAL_BASED_OUTPATIENT_CLINIC_OR_DEPARTMENT_OTHER): Payer: Medicare Other | Admitting: Internal Medicine

## 2017-06-09 VITALS — BP 136/64 | HR 76 | Temp 97.5°F | Resp 18 | Ht 70.0 in | Wt 193.8 lb

## 2017-06-09 DIAGNOSIS — Z5111 Encounter for antineoplastic chemotherapy: Secondary | ICD-10-CM

## 2017-06-09 DIAGNOSIS — I313 Pericardial effusion (noninflammatory): Principal | ICD-10-CM

## 2017-06-09 DIAGNOSIS — C3432 Malignant neoplasm of lower lobe, left bronchus or lung: Secondary | ICD-10-CM | POA: Diagnosis not present

## 2017-06-09 DIAGNOSIS — Z95828 Presence of other vascular implants and grafts: Secondary | ICD-10-CM

## 2017-06-09 DIAGNOSIS — C3492 Malignant neoplasm of unspecified part of left bronchus or lung: Secondary | ICD-10-CM

## 2017-06-09 DIAGNOSIS — I2699 Other pulmonary embolism without acute cor pulmonale: Secondary | ICD-10-CM

## 2017-06-09 DIAGNOSIS — C801 Malignant (primary) neoplasm, unspecified: Secondary | ICD-10-CM

## 2017-06-09 DIAGNOSIS — I3131 Malignant pericardial effusion in diseases classified elsewhere: Secondary | ICD-10-CM

## 2017-06-09 LAB — CBC WITH DIFFERENTIAL/PLATELET
BASO%: 0 % (ref 0.0–2.0)
Basophils Absolute: 0 10*3/uL (ref 0.0–0.1)
EOS%: 0 % (ref 0.0–7.0)
Eosinophils Absolute: 0 10*3/uL (ref 0.0–0.5)
HCT: 35.4 % — ABNORMAL LOW (ref 38.4–49.9)
HEMOGLOBIN: 11.3 g/dL — AB (ref 13.0–17.1)
LYMPH%: 15.1 % (ref 14.0–49.0)
MCH: 35 pg — ABNORMAL HIGH (ref 27.2–33.4)
MCHC: 31.9 g/dL — ABNORMAL LOW (ref 32.0–36.0)
MCV: 109.6 fL — ABNORMAL HIGH (ref 79.3–98.0)
MONO#: 0.9 10*3/uL (ref 0.1–0.9)
MONO%: 14.3 % — AB (ref 0.0–14.0)
NEUT%: 70.6 % (ref 39.0–75.0)
NEUTROS ABS: 4.6 10*3/uL (ref 1.5–6.5)
Platelets: 247 10*3/uL (ref 140–400)
RBC: 3.23 10*6/uL — AB (ref 4.20–5.82)
RDW: 20.9 % — AB (ref 11.0–14.6)
WBC: 6.5 10*3/uL (ref 4.0–10.3)
lymph#: 1 10*3/uL (ref 0.9–3.3)

## 2017-06-09 LAB — COMPREHENSIVE METABOLIC PANEL
ALT: 47 U/L (ref 0–55)
AST: 32 U/L (ref 5–34)
Albumin: 3.4 g/dL — ABNORMAL LOW (ref 3.5–5.0)
Alkaline Phosphatase: 83 U/L (ref 40–150)
Anion Gap: 12 mEq/L — ABNORMAL HIGH (ref 3–11)
BILIRUBIN TOTAL: 0.43 mg/dL (ref 0.20–1.20)
BUN: 16 mg/dL (ref 7.0–26.0)
CO2: 23 meq/L (ref 22–29)
CREATININE: 1.2 mg/dL (ref 0.7–1.3)
Calcium: 9.3 mg/dL (ref 8.4–10.4)
Chloride: 105 mEq/L (ref 98–109)
EGFR: 64 mL/min/{1.73_m2} — AB (ref >90)
GLUCOSE: 120 mg/dL (ref 70–140)
Potassium: 4.5 mEq/L (ref 3.5–5.1)
SODIUM: 140 meq/L (ref 136–145)
TOTAL PROTEIN: 7 g/dL (ref 6.4–8.3)

## 2017-06-09 MED ORDER — SODIUM CHLORIDE 0.9 % IV SOLN
506.5000 mg | Freq: Once | INTRAVENOUS | Status: AC
  Administered 2017-06-09: 510 mg via INTRAVENOUS
  Filled 2017-06-09: qty 51

## 2017-06-09 MED ORDER — SODIUM CHLORIDE 0.9 % IV SOLN
Freq: Once | INTRAVENOUS | Status: AC
  Administered 2017-06-09: 12:00:00 via INTRAVENOUS
  Filled 2017-06-09: qty 5

## 2017-06-09 MED ORDER — HEPARIN SOD (PORK) LOCK FLUSH 100 UNIT/ML IV SOLN
500.0000 [IU] | Freq: Once | INTRAVENOUS | Status: AC | PRN
  Administered 2017-06-09: 500 [IU]
  Filled 2017-06-09: qty 5

## 2017-06-09 MED ORDER — SODIUM CHLORIDE 0.9 % IV SOLN
Freq: Once | INTRAVENOUS | Status: AC
  Administered 2017-06-09: 12:00:00 via INTRAVENOUS

## 2017-06-09 MED ORDER — PALONOSETRON HCL INJECTION 0.25 MG/5ML
INTRAVENOUS | Status: AC
Start: 2017-06-09 — End: 2017-06-09
  Filled 2017-06-09: qty 5

## 2017-06-09 MED ORDER — PEMETREXED DISODIUM CHEMO INJECTION 500 MG
500.0000 mg/m2 | Freq: Once | INTRAVENOUS | Status: AC
  Administered 2017-06-09: 1100 mg via INTRAVENOUS
  Filled 2017-06-09: qty 40

## 2017-06-09 MED ORDER — PALONOSETRON HCL INJECTION 0.25 MG/5ML
0.2500 mg | Freq: Once | INTRAVENOUS | Status: AC
  Administered 2017-06-09: 0.25 mg via INTRAVENOUS

## 2017-06-09 MED ORDER — SODIUM CHLORIDE 0.9% FLUSH
10.0000 mL | INTRAVENOUS | Status: DC | PRN
  Administered 2017-06-09: 10 mL
  Filled 2017-06-09: qty 10

## 2017-06-09 MED ORDER — SODIUM CHLORIDE 0.9% FLUSH
10.0000 mL | INTRAVENOUS | Status: DC | PRN
  Administered 2017-06-09: 10 mL via INTRAVENOUS
  Filled 2017-06-09: qty 10

## 2017-06-09 NOTE — Progress Notes (Signed)
[facility] Telephone:(336) [phone]   Fax:([phone]) [phone]  OFFICE PROGRESS NOTE  Rankins, Nicholas Salinas, MD [place] [address]  DIAGNOSIS: Stage IV (T1a, N3, M1b) non-small cell lung cancer, adenocarcinoma with PDL 1 expression of 80%. 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  PRIOR THERAPY: Treatment with immunotherapy with [name] (pembrolizumab) 200 MG IV every 3 weeks status post 6 cycles discontinued secondary to disease progression..  CURRENT THERAPY: Systemic chemotherapy with carboplatin for AUC of 5, Alimta 500 MG/M2 and Avastin 15 MG/KG every 3 weeks. First dose 02/24/2017. Status post 5 cycles.  INTERVAL HISTORY: Nicholas Hale 71 y.o. male returns to the clinic today for follow-up visit accompanied by his wife. The patient is feeling fine today was no specific complaints except for weight loss secondary to inability to eat much because of dental pain. He is scheduled to see oral surgeon for extraction of bone lesions later this week. He has more fatigue with the last cycle of the chemotherapy. He denied having any fever or chills. He has no nausea, vomiting, diarrhea or constipation. He denied having any chest pain, shortness of breath, cough or hemoptysis. He has occasional epistaxis. He is here today for evaluation before starting cycle #6.  MEDICAL HISTORY: Past Medical History:  Diagnosis Date  . Abdominal aortic aneurysm ([facility])   . Arthritis    "hips; lower spine" (05/24/2014)  . Chemotherapy induced neutropenia ([facility]) 03/31/2017  . Chronic fatigue 10/05/2016  . Encounter for antineoplastic chemotherapy 02/17/2017  . Hyperlipidemia   . Hypertension   . Lung cancer ([facility]) 09/25/2016  . PE (pulmonary embolism) 9/15  . Pericardial effusion 09/20/2016   Malignant effusion s/p pericardial drain  . Personal history of DVT (deep vein thrombosis) 9/15    ALLERGIES:  has No Known Allergies.  MEDICATIONS:  Current Outpatient Prescriptions  Medication Sig Dispense Refill  . atorvastatin (LIPITOR) 20 MG tablet Take 1 tablet (20 mg total) by mouth daily at 6 PM. 90 tablet 3  . chlorhexidine (PERIDEX) 0.12 % solution 5 mLs by Mouth Rinse route 2 (two) times daily.    Marland Kitchen dexamethasone (DECADRON) 4 MG tablet 4 mg by mouth twice a day the day before, day of and day after the chemotherapy every 3 weeks. 40 tablet 1  . diphenhydrAMINE (BENADRYL) 25 mg capsule Take 25 mg by mouth every 6 (six) hours as needed.    . folic acid (FOLVITE) 1 MG tablet Take 1 tablet (1 mg total) by mouth daily. 30 tablet 1  . lidocaine-prilocaine (EMLA) cream Apply 1 application topically 3 times/day as needed-between meals & bedtime (on port site).    . magic mouthwash SOLN Take 5 mLs by mouth 4 (four) times daily. Hydrocortisone 60 mg ,. Nystatin 30 ml Benadryl 12.5 mg/5 ml. Mix to 240 ml 240 mL 0  . prochlorperazine (COMPAZINE) 10 MG tablet Take 1 tablet (10 mg total) by mouth every 6 (six) hours as needed for nausea or vomiting. 30 tablet 0  . Tiotropium Bromide Monohydrate (SPIRIVA RESPIMAT) 2.5 MCG/ACT AERS Inhale 2 puffs into the lungs daily. 3 Inhaler 0  . XARELTO 20 MG TABS tablet TAKE 1 TABLET BY MOUTH EVERY DAY 30 tablet 5   No current facility-administered medications for this visit.     SURGICAL HISTORY:  Past Surgical History:  Procedure Laterality Date  .  ANTERIOR FUSION CERVICAL SPINE  ~ 2005  . APPENDECTOMY  1978  . CARDIAC CATHETERIZATION N/A 09/21/2016   Procedure: Pericardiocentesis;  Surgeon: Jettie Booze, MD;  Location: [facility];  Service: Cardiovascular;  Laterality: N/A;  . COLONOSCOPY    . HEMIARTHROPLASTY SHOULDER FRACTURE Left 12/2008  . INGUINAL HERNIA REPAIR Bilateral 1992  . IR GENERIC HISTORICAL  02/01/2017   IR FLUORO GUIDE  PORT INSERTION RIGHT 02/01/2017 Arne Cleveland, MD [facility]  . IR GENERIC HISTORICAL  02/01/2017   IR US GUIDE VASC ACCESS RIGHT 02/01/2017 Arne Cleveland, MD [facility]  . JOINT REPLACEMENT    . PERICARDIAL FLUID DRAINAGE  09/21/2016  . TOTAL HIP ARTHROPLASTY Left 05/23/2014   Procedure: TOTAL HIP ARTHROPLASTY;  Surgeon: Kerin Salen, MD;  Location: [facility];  Service: Orthopedics;  Laterality: Left;  . TOTAL HIP ARTHROPLASTY Right 05/15/2015   Procedure: TOTAL HIP ARTHROPLASTY;  Surgeon: Frederik Pear, MD;  Location: [facility];  Service: Orthopedics;  Laterality: Right;    REVIEW OF SYSTEMS:  A comprehensive review of systems was negative except for: Constitutional: positive for fatigue Ears, nose, mouth, throat, and face: positive for epistaxis   PHYSICAL EXAMINATION: General appearance: alert, cooperative, fatigued and no distress Head: Normocephalic, without obvious abnormality, atraumatic Neck: no adenopathy, no JVD, supple, symmetrical, trachea midline and thyroid not enlarged, symmetric, no tenderness/mass/nodules Lymph nodes: Cervical, supraclavicular, and axillary nodes normal. Resp: clear to auscultation bilaterally Back: symmetric, no curvature. ROM normal. No CVA tenderness. Cardio: regular rate and rhythm, S1, S2 normal, no murmur, click, rub or gallop GI: soft, non-tender; bowel sounds normal; no masses,  no organomegaly Extremities: extremities normal, atraumatic, no cyanosis or edema  ECOG PERFORMANCE STATUS: 1 - Symptomatic but completely ambulatory  Blood pressure 136/64, pulse 76, temperature (!) 97.5 F (36.4 C), temperature source Oral, resp. rate 18, height 5\' 10"  (1.778 m), weight 193 lb 12.8 oz (87.9 kg), SpO2 100 %.  LABORATORY DATA: Lab Results  Component Value Date   WBC 6.5 06/09/2017   HGB 11.3 (L) 06/09/2017   HCT 35.4 (L) 06/09/2017   MCV 109.6 (H) 06/09/2017   PLT 247 06/09/2017      Chemistry      Component Value Date/Time   NA 140 06/09/2017 0921     K 4.5 06/09/2017 0921   CL 105 03/30/2017 1859   CO2 23 06/09/2017 0921   BUN 16.0 06/09/2017 0921   CREATININE 1.2 06/09/2017 0921      Component Value Date/Time   CALCIUM 9.3 06/09/2017 0921   ALKPHOS 83 06/09/2017 0921   AST 32 06/09/2017 0921   ALT 47 06/09/2017 0921   BILITOT 0.43 06/09/2017 0921       RADIOGRAPHIC STUDIES: No results found.  ASSESSMENT AND PLAN:  This is a very pleasant 71 years old white male with stage IV non-small cell lung cancer, adenocarcinoma with positive PDL 1 expression of 80% status post 6 cycles of treatment with [place] discontinued secondary to disease progression.  The patient is undergoing systemic chemotherapy with carboplatin, Alimta and Avastin status post 5 cycles and has been tolerating the treatment well except for increasing fatigue after the last cycle of his treatment. I recommended for him to proceed with cycle #6 today as a scheduled but I will discontinue Avastin for this cycle because of his expected oral surgery later this week. He will also need to hold his Xarelto before the procedure. I will see him back for follow-up visit in 4 weeks  for evaluation after repeating CT scan of the chest, abdomen and pelvis for restaging of his disease. The patient was advised to call immediately if he has any concerning symptoms in the interval. The patient voices understanding of current disease status and treatment options and is in agreement with the current care plan. All questions were answered. The patient knows to call the clinic with any problems, questions or concerns. We can certainly see the patient much sooner if necessary. I spent 10 minutes counseling the patient face to face. The total time spent in the appointment was 15 minutes.  Disclaimer: This note was dictated with voice recognition software. Similar sounding words can inadvertently be transcribed and may not be corrected upon review.

## 2017-06-09 NOTE — Patient Instructions (Addendum)
[facility] Discharge Instructions for Patients Receiving Chemotherapy  Today you received the following chemotherapy agents: Alimta and Carboplatin  To help prevent nausea and vomiting after your treatment, we encourage you to take your nausea medication as directed.    If you develop nausea and vomiting that is not controlled by your nausea medication, call the clinic.   BELOW ARE SYMPTOMS THAT SHOULD BE REPORTED IMMEDIATELY:  *FEVER GREATER THAN 100.5 F  *CHILLS WITH OR WITHOUT FEVER  NAUSEA AND VOMITING THAT IS NOT CONTROLLED WITH YOUR NAUSEA MEDICATION  *UNUSUAL SHORTNESS OF BREATH  *UNUSUAL BRUISING OR BLEEDING  TENDERNESS IN MOUTH AND THROAT WITH OR WITHOUT PRESENCE OF ULCERS  *URINARY PROBLEMS  *BOWEL PROBLEMS  UNUSUAL RASH Items with * indicate a potential emergency and should be followed up as soon as possible.  Feel free to call the clinic you have any questions or concerns. The clinic phone number is [phone].  Please show the [facility] at check-in to the Emergency Department and triage nurse.

## 2017-06-09 NOTE — Telephone Encounter (Signed)
Gave patient avs report and appointments for July and August. Per MM no need to schedule any further tx now just weekly until 4 week f/u. Central radiology will call re scan. Last date in patient care plan 7/11.

## 2017-06-14 ENCOUNTER — Ambulatory Visit: Payer: Medicare Other | Admitting: Internal Medicine

## 2017-06-15 ENCOUNTER — Other Ambulatory Visit: Payer: Self-pay

## 2017-06-15 ENCOUNTER — Ambulatory Visit (HOSPITAL_COMMUNITY): Payer: Medicare Other | Attending: Cardiology

## 2017-06-15 DIAGNOSIS — E785 Hyperlipidemia, unspecified: Secondary | ICD-10-CM | POA: Insufficient documentation

## 2017-06-15 DIAGNOSIS — R0602 Shortness of breath: Secondary | ICD-10-CM | POA: Diagnosis not present

## 2017-06-15 DIAGNOSIS — I10 Essential (primary) hypertension: Secondary | ICD-10-CM | POA: Insufficient documentation

## 2017-06-15 DIAGNOSIS — I3139 Other pericardial effusion (noninflammatory): Secondary | ICD-10-CM

## 2017-06-15 DIAGNOSIS — I313 Pericardial effusion (noninflammatory): Secondary | ICD-10-CM

## 2017-06-16 ENCOUNTER — Telehealth: Payer: Self-pay | Admitting: *Deleted

## 2017-06-16 ENCOUNTER — Other Ambulatory Visit (HOSPITAL_BASED_OUTPATIENT_CLINIC_OR_DEPARTMENT_OTHER): Payer: Medicare Other

## 2017-06-16 DIAGNOSIS — C3492 Malignant neoplasm of unspecified part of left bronchus or lung: Secondary | ICD-10-CM

## 2017-06-16 DIAGNOSIS — C3432 Malignant neoplasm of lower lobe, left bronchus or lung: Secondary | ICD-10-CM | POA: Diagnosis present

## 2017-06-16 LAB — CBC WITH DIFFERENTIAL/PLATELET
BASO%: 0.4 % (ref 0.0–2.0)
BASOS ABS: 0 10*3/uL (ref 0.0–0.1)
EOS%: 0.4 % (ref 0.0–7.0)
Eosinophils Absolute: 0 10*3/uL (ref 0.0–0.5)
HCT: 35 % — ABNORMAL LOW (ref 38.4–49.9)
HGB: 11.2 g/dL — ABNORMAL LOW (ref 13.0–17.1)
LYMPH%: 35.6 % (ref 14.0–49.0)
MCH: 35 pg — AB (ref 27.2–33.4)
MCHC: 32 g/dL (ref 32.0–36.0)
MCV: 109.4 fL — ABNORMAL HIGH (ref 79.3–98.0)
MONO#: 0.1 10*3/uL (ref 0.1–0.9)
MONO%: 1.8 % (ref 0.0–14.0)
NEUT#: 1.7 10*3/uL (ref 1.5–6.5)
NEUT%: 61.8 % (ref 39.0–75.0)
Platelets: 93 10*3/uL — ABNORMAL LOW (ref 140–400)
RBC: 3.2 10*6/uL — AB (ref 4.20–5.82)
RDW: 19 % — ABNORMAL HIGH (ref 11.0–14.6)
WBC: 2.8 10*3/uL — ABNORMAL LOW (ref 4.0–10.3)
lymph#: 1 10*3/uL (ref 0.9–3.3)

## 2017-06-16 LAB — COMPREHENSIVE METABOLIC PANEL
ALT: 52 U/L (ref 0–55)
ANION GAP: 10 meq/L (ref 3–11)
AST: 32 U/L (ref 5–34)
Albumin: 3.3 g/dL — ABNORMAL LOW (ref 3.5–5.0)
Alkaline Phosphatase: 71 U/L (ref 40–150)
BUN: 27.2 mg/dL — AB (ref 7.0–26.0)
CHLORIDE: 105 meq/L (ref 98–109)
CO2: 26 meq/L (ref 22–29)
CREATININE: 1 mg/dL (ref 0.7–1.3)
Calcium: 8.8 mg/dL (ref 8.4–10.4)
EGFR: 73 mL/min/{1.73_m2} — ABNORMAL LOW (ref >90)
Glucose: 121 mg/dl (ref 70–140)
POTASSIUM: 4.6 meq/L (ref 3.5–5.1)
Sodium: 141 mEq/L (ref 136–145)
Total Bilirubin: 0.55 mg/dL (ref 0.20–1.20)
Total Protein: 6.5 g/dL (ref 6.4–8.3)

## 2017-06-16 NOTE — Telephone Encounter (Signed)
-----   Message from Nelva Bush, MD sent at 06/16/2017  8:26 AM EDT ----- Please let Nicholas Hale know that his echocardiogram shows that his heart is squeezing vigorously and that there is no recurrence of pericardial effusion. I see no clear cardiac explanation for his shortness of breath; I suspect that it is due to his chemotherapy and lung cancer. We will follow-up as planned in 6 months, unless new concern arise in the meantime.

## 2017-06-16 NOTE — Telephone Encounter (Signed)
Patient's wife informed

## 2017-06-23 ENCOUNTER — Telehealth: Payer: Self-pay | Admitting: Medical Oncology

## 2017-06-23 ENCOUNTER — Other Ambulatory Visit (HOSPITAL_BASED_OUTPATIENT_CLINIC_OR_DEPARTMENT_OTHER): Payer: Medicare Other

## 2017-06-23 DIAGNOSIS — C3432 Malignant neoplasm of lower lobe, left bronchus or lung: Secondary | ICD-10-CM | POA: Diagnosis present

## 2017-06-23 DIAGNOSIS — C3492 Malignant neoplasm of unspecified part of left bronchus or lung: Secondary | ICD-10-CM

## 2017-06-23 LAB — CBC WITH DIFFERENTIAL/PLATELET
BASO%: 0 % (ref 0.0–2.0)
Basophils Absolute: 0 10*3/uL (ref 0.0–0.1)
EOS%: 1.1 % (ref 0.0–7.0)
Eosinophils Absolute: 0 10*3/uL (ref 0.0–0.5)
HCT: 29.2 % — ABNORMAL LOW (ref 38.4–49.9)
HEMOGLOBIN: 9.3 g/dL — AB (ref 13.0–17.1)
LYMPH#: 1 10*3/uL (ref 0.9–3.3)
LYMPH%: 56.4 % — AB (ref 14.0–49.0)
MCH: 34.8 pg — ABNORMAL HIGH (ref 27.2–33.4)
MCHC: 31.8 g/dL — AB (ref 32.0–36.0)
MCV: 109.4 fL — AB (ref 79.3–98.0)
MONO#: 0.2 10*3/uL (ref 0.1–0.9)
MONO%: 11.7 % (ref 0.0–14.0)
NEUT#: 0.6 10*3/uL — ABNORMAL LOW (ref 1.5–6.5)
NEUT%: 30.8 % — AB (ref 39.0–75.0)
Platelets: 28 10*3/uL — ABNORMAL LOW (ref 140–400)
RBC: 2.67 10*6/uL — AB (ref 4.20–5.82)
RDW: 18.8 % — ABNORMAL HIGH (ref 11.0–14.6)
WBC: 1.8 10*3/uL — ABNORMAL LOW (ref 4.0–10.3)
nRBC: 2 % — ABNORMAL HIGH (ref 0–0)

## 2017-06-23 LAB — COMPREHENSIVE METABOLIC PANEL
ALBUMIN: 3.2 g/dL — AB (ref 3.5–5.0)
ALK PHOS: 76 U/L (ref 40–150)
ALT: 50 U/L (ref 0–55)
AST: 29 U/L (ref 5–34)
Anion Gap: 11 mEq/L (ref 3–11)
BUN: 16.9 mg/dL (ref 7.0–26.0)
CALCIUM: 8.7 mg/dL (ref 8.4–10.4)
CHLORIDE: 107 meq/L (ref 98–109)
CO2: 25 mEq/L (ref 22–29)
CREATININE: 1 mg/dL (ref 0.7–1.3)
EGFR: 74 mL/min/{1.73_m2} — ABNORMAL LOW (ref >90)
GLUCOSE: 128 mg/dL (ref 70–140)
Potassium: 4.2 mEq/L (ref 3.5–5.1)
Sodium: 142 mEq/L (ref 136–145)
Total Bilirubin: 0.34 mg/dL (ref 0.20–1.20)
Total Protein: 6.2 g/dL — ABNORMAL LOW (ref 6.4–8.3)

## 2017-06-23 LAB — UA PROTEIN, DIPSTICK - CHCC: PROTEIN: NEGATIVE mg/dL

## 2017-06-23 NOTE — Telephone Encounter (Signed)
-----   Message from [name], MD sent at 06/23/2017 11:40 AM EDT ----- Please advise the patient with neutropenic and bleeding precautions. ----- Message ----- From: Interface, Lab In Three Zero One Sent: 06/23/2017  10:55 AM To: [name], MD

## 2017-06-23 NOTE — Telephone Encounter (Signed)
Notified patient of Neutrapenic and bleeding precautions- patient and or family voice understanding. He had a one episode nose bleed today that started spontaneously for 15-20 minutes. He was sitting at computer and felt nose running and blood trickle down to his lips) -when he put a cloth up his nostrils there was blood but  not enough blood to soak the rag. Per [name] I instructed pt to monitor for now and to call for any signs of infection , temp >100.95f. Maintain good hand hygeine.

## 2017-06-30 ENCOUNTER — Other Ambulatory Visit (HOSPITAL_BASED_OUTPATIENT_CLINIC_OR_DEPARTMENT_OTHER): Payer: Medicare Other

## 2017-06-30 DIAGNOSIS — C3432 Malignant neoplasm of lower lobe, left bronchus or lung: Secondary | ICD-10-CM

## 2017-06-30 DIAGNOSIS — C3492 Malignant neoplasm of unspecified part of left bronchus or lung: Secondary | ICD-10-CM

## 2017-06-30 LAB — COMPREHENSIVE METABOLIC PANEL
ALBUMIN: 3.2 g/dL — AB (ref 3.5–5.0)
ALK PHOS: 77 U/L (ref 40–150)
ALT: 34 U/L (ref 0–55)
AST: 28 U/L (ref 5–34)
Anion Gap: 10 mEq/L (ref 3–11)
BUN: 12.6 mg/dL (ref 7.0–26.0)
CO2: 28 meq/L (ref 22–29)
Calcium: 9 mg/dL (ref 8.4–10.4)
Chloride: 105 mEq/L (ref 98–109)
Creatinine: 1.2 mg/dL (ref 0.7–1.3)
EGFR: 64 mL/min/{1.73_m2} — ABNORMAL LOW (ref >90)
GLUCOSE: 108 mg/dL (ref 70–140)
POTASSIUM: 4.2 meq/L (ref 3.5–5.1)
SODIUM: 143 meq/L (ref 136–145)
Total Bilirubin: 0.4 mg/dL (ref 0.20–1.20)
Total Protein: 6.5 g/dL (ref 6.4–8.3)

## 2017-06-30 LAB — CBC WITH DIFFERENTIAL/PLATELET
BASO%: 0.6 % (ref 0.0–2.0)
BASOS ABS: 0 10*3/uL (ref 0.0–0.1)
EOS ABS: 0 10*3/uL (ref 0.0–0.5)
EOS%: 0.7 % (ref 0.0–7.0)
HCT: 31.2 % — ABNORMAL LOW (ref 38.4–49.9)
HGB: 10.3 g/dL — ABNORMAL LOW (ref 13.0–17.1)
LYMPH%: 25.6 % (ref 14.0–49.0)
MCH: 36 pg — AB (ref 27.2–33.4)
MCHC: 33 g/dL (ref 32.0–36.0)
MCV: 109.3 fL — AB (ref 79.3–98.0)
MONO#: 1 10*3/uL — ABNORMAL HIGH (ref 0.1–0.9)
MONO%: 27 % — AB (ref 0.0–14.0)
NEUT#: 1.7 10*3/uL (ref 1.5–6.5)
NEUT%: 46.1 % (ref 39.0–75.0)
Platelets: 121 10*3/uL — ABNORMAL LOW (ref 140–400)
RBC: 2.85 10*6/uL — AB (ref 4.20–5.82)
RDW: 22.7 % — ABNORMAL HIGH (ref 11.0–14.6)
WBC: 3.7 10*3/uL — AB (ref 4.0–10.3)
lymph#: 1 10*3/uL (ref 0.9–3.3)

## 2017-07-01 ENCOUNTER — Telehealth: Payer: Self-pay | Admitting: Medical Oncology

## 2017-07-01 NOTE — Telephone Encounter (Signed)
I reviewed labs with pt . He wants to have company and I told him his counts are good and he can be around company.

## 2017-07-06 ENCOUNTER — Other Ambulatory Visit (HOSPITAL_BASED_OUTPATIENT_CLINIC_OR_DEPARTMENT_OTHER): Payer: Medicare Other

## 2017-07-06 ENCOUNTER — Ambulatory Visit (HOSPITAL_COMMUNITY)
Admission: RE | Admit: 2017-07-06 | Discharge: 2017-07-06 | Disposition: A | Discharge status: Home / Self Care | Payer: Medicare Other | Source: Ambulatory Visit | Attending: Internal Medicine | Admitting: Internal Medicine

## 2017-07-06 ENCOUNTER — Encounter (HOSPITAL_COMMUNITY): Payer: Self-pay

## 2017-07-06 DIAGNOSIS — I3131 Malignant pericardial effusion in diseases classified elsewhere: Secondary | ICD-10-CM

## 2017-07-06 DIAGNOSIS — C3492 Malignant neoplasm of unspecified part of left bronchus or lung: Secondary | ICD-10-CM | POA: Insufficient documentation

## 2017-07-06 DIAGNOSIS — K7689 Other specified diseases of liver: Secondary | ICD-10-CM | POA: Diagnosis not present

## 2017-07-06 DIAGNOSIS — C3432 Malignant neoplasm of lower lobe, left bronchus or lung: Secondary | ICD-10-CM | POA: Diagnosis present

## 2017-07-06 DIAGNOSIS — Z5111 Encounter for antineoplastic chemotherapy: Secondary | ICD-10-CM | POA: Diagnosis not present

## 2017-07-06 DIAGNOSIS — R911 Solitary pulmonary nodule: Secondary | ICD-10-CM | POA: Diagnosis not present

## 2017-07-06 DIAGNOSIS — I2699 Other pulmonary embolism without acute cor pulmonale: Secondary | ICD-10-CM | POA: Insufficient documentation

## 2017-07-06 DIAGNOSIS — I318 Other specified diseases of pericardium: Secondary | ICD-10-CM | POA: Diagnosis not present

## 2017-07-06 DIAGNOSIS — I714 Abdominal aortic aneurysm, without rupture: Secondary | ICD-10-CM | POA: Diagnosis not present

## 2017-07-06 DIAGNOSIS — R918 Other nonspecific abnormal finding of lung field: Secondary | ICD-10-CM | POA: Diagnosis not present

## 2017-07-06 DIAGNOSIS — C801 Malignant (primary) neoplasm, unspecified: Secondary | ICD-10-CM | POA: Diagnosis not present

## 2017-07-06 DIAGNOSIS — N200 Calculus of kidney: Secondary | ICD-10-CM | POA: Diagnosis not present

## 2017-07-06 DIAGNOSIS — I313 Pericardial effusion (noninflammatory): Secondary | ICD-10-CM

## 2017-07-06 LAB — COMPREHENSIVE METABOLIC PANEL
ALT: 48 U/L (ref 0–55)
ANION GAP: 10 meq/L (ref 3–11)
AST: 42 U/L — AB (ref 5–34)
Albumin: 3.6 g/dL (ref 3.5–5.0)
Alkaline Phosphatase: 79 U/L (ref 40–150)
BUN: 14.5 mg/dL (ref 7.0–26.0)
CHLORIDE: 107 meq/L (ref 98–109)
CO2: 25 meq/L (ref 22–29)
CREATININE: 1.1 mg/dL (ref 0.7–1.3)
Calcium: 9.1 mg/dL (ref 8.4–10.4)
EGFR: 69 mL/min/{1.73_m2} — ABNORMAL LOW (ref >90)
Glucose: 101 mg/dl (ref 70–140)
POTASSIUM: 4.4 meq/L (ref 3.5–5.1)
Sodium: 141 mEq/L (ref 136–145)
Total Bilirubin: 0.34 mg/dL (ref 0.20–1.20)
Total Protein: 7.2 g/dL (ref 6.4–8.3)

## 2017-07-06 LAB — CBC WITH DIFFERENTIAL/PLATELET
BASO%: 0.8 % (ref 0.0–2.0)
Basophils Absolute: 0.1 10*3/uL (ref 0.0–0.1)
EOS%: 0.4 % (ref 0.0–7.0)
Eosinophils Absolute: 0 10*3/uL (ref 0.0–0.5)
HCT: 36.4 % — ABNORMAL LOW (ref 38.4–49.9)
HGB: 11.9 g/dL — ABNORMAL LOW (ref 13.0–17.1)
LYMPH%: 22.9 % (ref 14.0–49.0)
MCH: 36 pg — AB (ref 27.2–33.4)
MCHC: 32.8 g/dL (ref 32.0–36.0)
MCV: 109.7 fL — ABNORMAL HIGH (ref 79.3–98.0)
MONO#: 1.2 10*3/uL — ABNORMAL HIGH (ref 0.1–0.9)
MONO%: 14.7 % — ABNORMAL HIGH (ref 0.0–14.0)
NEUT#: 4.8 10*3/uL (ref 1.5–6.5)
NEUT%: 61.2 % (ref 39.0–75.0)
Platelets: 140 10*3/uL (ref 140–400)
RBC: 3.32 10*6/uL — AB (ref 4.20–5.82)
RDW: 22.3 % — ABNORMAL HIGH (ref 11.0–14.6)
WBC: 7.8 10*3/uL (ref 4.0–10.3)
lymph#: 1.8 10*3/uL (ref 0.9–3.3)

## 2017-07-06 LAB — UA PROTEIN, DIPSTICK - CHCC: Protein, ur: NEGATIVE mg/dL

## 2017-07-06 MED ORDER — IOPAMIDOL (ISOVUE-300) INJECTION 61%
100.0000 mL | Freq: Once | INTRAVENOUS | Status: AC | PRN
  Administered 2017-07-06: 100 mL via INTRAVENOUS

## 2017-07-06 MED ORDER — IOPAMIDOL (ISOVUE-300) INJECTION 61%
INTRAVENOUS | Status: AC
  Filled 2017-07-06: qty 100

## 2017-07-08 ENCOUNTER — Telehealth: Payer: Self-pay | Admitting: Internal Medicine

## 2017-07-08 ENCOUNTER — Encounter: Payer: Self-pay | Admitting: Internal Medicine

## 2017-07-08 ENCOUNTER — Ambulatory Visit (HOSPITAL_BASED_OUTPATIENT_CLINIC_OR_DEPARTMENT_OTHER): Payer: Medicare Other | Admitting: Internal Medicine

## 2017-07-08 VITALS — BP 132/59 | HR 79 | Temp 98.6°F | Resp 18 | Ht 70.0 in | Wt 197.3 lb

## 2017-07-08 DIAGNOSIS — Z7189 Other specified counseling: Secondary | ICD-10-CM

## 2017-07-08 DIAGNOSIS — C3432 Malignant neoplasm of lower lobe, left bronchus or lung: Secondary | ICD-10-CM

## 2017-07-08 DIAGNOSIS — I10 Essential (primary) hypertension: Secondary | ICD-10-CM | POA: Diagnosis not present

## 2017-07-08 DIAGNOSIS — I2782 Chronic pulmonary embolism: Secondary | ICD-10-CM

## 2017-07-08 DIAGNOSIS — Z7901 Long term (current) use of anticoagulants: Secondary | ICD-10-CM | POA: Diagnosis not present

## 2017-07-08 DIAGNOSIS — C801 Malignant (primary) neoplasm, unspecified: Secondary | ICD-10-CM

## 2017-07-08 DIAGNOSIS — R5382 Chronic fatigue, unspecified: Secondary | ICD-10-CM

## 2017-07-08 DIAGNOSIS — C3492 Malignant neoplasm of unspecified part of left bronchus or lung: Secondary | ICD-10-CM

## 2017-07-08 DIAGNOSIS — I313 Pericardial effusion (noninflammatory): Principal | ICD-10-CM

## 2017-07-08 DIAGNOSIS — I3131 Malignant pericardial effusion in diseases classified elsewhere: Secondary | ICD-10-CM

## 2017-07-08 DIAGNOSIS — I824Y3 Acute embolism and thrombosis of unspecified deep veins of proximal lower extremity, bilateral: Secondary | ICD-10-CM

## 2017-07-08 DIAGNOSIS — Z5111 Encounter for antineoplastic chemotherapy: Secondary | ICD-10-CM

## 2017-07-08 NOTE — Progress Notes (Signed)
DISCONTINUE ON PATHWAY REGIMEN - Non-Small Cell Lung     A cycle is every 21 days:     Carboplatin      Pemetrexed      Bevacizumab   **Always confirm dose/schedule in your pharmacy ordering system**    REASON: Other Reason PRIOR TREATMENT: [id number]: Carboplatin AUC=5 + Pemetrexed 500 mg/m2 + Bevacizumab 15 mg/kg q21 Days x 4 Cycles TREATMENT RESPONSE: Stable Disease (SD)  START ON PATHWAY REGIMEN - Non-Small Cell Lung     A cycle is every 21 days:     Pemetrexed   **Always confirm dose/schedule in your pharmacy ordering system**    Patient Characteristics: Stage IV Metastatic, Non Squamous, Maintenance - Chemotherapy/Immunotherapy, PS = 0, 1, Initial Pemetrexed + Platinum Agent AJCC T Category: T1a Current Disease Status: Distant Metastases AJCC N Category: N3 AJCC M Category: M1b AJCC 8 Stage Grouping: IVA Histology: Non Squamous Cell ROS1 Rearrangement Status: Awaiting Test Results [id number] Mutation Status: Not Applicable - EGFR Mutation Negative/Unknown Other Mutations/Biomarkers: No Other Actionable Mutations PD-L1 Expression Status: PD-L1 Positive >= 50% (TPS) Chemotherapy/Immunotherapy LOT: Maintenance Chemotherapy/Immunotherapy Molecular Targeted Therapy: Not Appropriate ALK Translocation Status: Awaiting Test Results Would you be surprised if this patient died  in the next year? I would NOT be surprised if this patient died in the next year EGFR Mutation Status: Awaiting Test Results BRAF V600E Mutation Status: Awaiting Test Results Performance Status: PS = 0, 1 Intent of Therapy: Non-Curative / Palliative Intent, Discussed with Patient

## 2017-07-08 NOTE — Telephone Encounter (Signed)
Gave patient avs and calendar for upcoming appointments.  °

## 2017-07-08 NOTE — Progress Notes (Signed)
[facility] [phone]   [phone]  OFFICE PROGRESS NOTE  Rankins, Nicholas Salinas, MD [place] [address]  DIAGNOSIS: Stage IV (T1a, N3, M1b) non-small cell lung cancer, adenocarcinoma with PDL 1 expression of 80%. 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  PRIOR THERAPY:  1) Treatment with immunotherapy with [name] (pembrolizumab) 200 MG IV every 3 weeks status post 6 cycles discontinued secondary to disease progression. 2) Systemic chemotherapy with carboplatin for AUC of 5, Alimta 500 MG/M2 and Avastin 15 MG/KG every 3 weeks. First dose 02/24/2017. Status post 6 cycles. Last dose was given 06/09/2017.  CURRENT THERAPY: Maintenance treatment with Alimta 500 MG/M2 and Avastin 15 MG/KG every 3 weeks. First dose 07/21/2017.  INTERVAL HISTORY: Nicholas Hale 71 y.o. male returns to the clinic today for follow-up visit accompanied by his wife. The patient tolerated the last cycle of his treatment well except for fatigue. He denied having any chest pain, shortness of breath, cough or hemoptysis. He denied having any fever or chills. He has no nausea, vomiting, diarrhea or constipation. He denied having any significant weight loss or night sweats. He had repeat CT scan of the chest, abdomen and pelvis performed recently and he is here for evaluation and discussion of the scan results and treatment options.   MEDICAL HISTORY: Past Medical History:  Diagnosis Date  . Abdominal aortic aneurysm ([place])   . Arthritis    "hips; lower spine" (05/24/2014)  . Chemotherapy induced neutropenia ([place]) 03/31/2017  . Chronic fatigue 10/05/2016  . Encounter for antineoplastic chemotherapy 02/17/2017  . Hyperlipidemia   . Hypertension   . Lung cancer ([facility]) 09/25/2016  .  PE (pulmonary embolism) 9/15  . Pericardial effusion 09/20/2016   Malignant effusion s/p pericardial drain  . Personal history of DVT (deep vein thrombosis) 9/15    ALLERGIES:  has No Known Allergies.  MEDICATIONS:  Current Outpatient Prescriptions  Medication Sig Dispense Refill  . atorvastatin (LIPITOR) 20 MG tablet Take 1 tablet (20 mg total) by mouth daily at 6 PM. 90 tablet 3  . folic acid (FOLVITE) 1 MG tablet Take 1 tablet (1 mg total) by mouth daily. 30 tablet 1  . lidocaine-prilocaine (EMLA) cream Apply 1 application topically as needed (on port site).     . magic mouthwash SOLN Take 5 mLs by mouth 4 (four) times daily. Hydrocortisone 60 mg ,. Nystatin 30 ml Benadryl 12.5 mg/5 ml. Mix to 240 ml 240 mL 0  . Tiotropium Bromide Monohydrate (SPIRIVA RESPIMAT) 2.5 MCG/ACT AERS Inhale 2 puffs into the lungs daily. 3 Inhaler 0  . XARELTO 20 MG TABS tablet TAKE 1 TABLET BY MOUTH EVERY DAY 30 tablet 5  . dexamethasone (DECADRON) 4 MG tablet 4 mg by mouth twice a day the day before, day of and day after the chemotherapy every 3 weeks. 40 tablet 1  . prochlorperazine (COMPAZINE) 10 MG tablet Take 1 tablet (10 mg total) by mouth every 6 (six) hours as needed for nausea or vomiting. (Patient not taking: Reported on 06/09/2017) 30 tablet 0   No current facility-administered medications for this visit.     SURGICAL HISTORY:  Past Surgical History:  Procedure Laterality Date  . ANTERIOR FUSION CERVICAL SPINE  ~ 2005  . APPENDECTOMY  1978  . CARDIAC CATHETERIZATION N/A 09/21/2016   Procedure: Pericardiocentesis;  Surgeon: Jettie Booze, MD;  Location: [facility];  Service: Cardiovascular;  Laterality: N/A;  . COLONOSCOPY    . HEMIARTHROPLASTY SHOULDER FRACTURE Left 12/2008  . INGUINAL HERNIA REPAIR Bilateral 1992  . IR GENERIC HISTORICAL  02/01/2017   IR FLUORO GUIDE PORT INSERTION RIGHT 02/01/2017 Arne Cleveland, MD [facility]  . IR GENERIC HISTORICAL  02/01/2017   IR US GUIDE  VASC ACCESS RIGHT 02/01/2017 Arne Cleveland, MD [facility]  . JOINT REPLACEMENT    . PERICARDIAL FLUID DRAINAGE  09/21/2016  . TOTAL HIP ARTHROPLASTY Left 05/23/2014   Procedure: TOTAL HIP ARTHROPLASTY;  Surgeon: Kerin Salen, MD;  Location: [facility];  Service: Orthopedics;  Laterality: Left;  . TOTAL HIP ARTHROPLASTY Right 05/15/2015   Procedure: TOTAL HIP ARTHROPLASTY;  Surgeon: Frederik Pear, MD;  Location: [facility];  Service: Orthopedics;  Laterality: Right;    REVIEW OF SYSTEMS:  Constitutional: positive for fatigue Eyes: negative Ears, nose, mouth, throat, and face: negative Respiratory: negative Cardiovascular: negative Gastrointestinal: negative Genitourinary:negative Integument/breast: negative Hematologic/lymphatic: negative Musculoskeletal:negative Neurological: negative Behavioral/Psych: negative Endocrine: negative Allergic/Immunologic: negative   PHYSICAL EXAMINATION: General appearance: alert, cooperative, fatigued and no distress Head: Normocephalic, without obvious abnormality, atraumatic Neck: no adenopathy, no JVD, supple, symmetrical, trachea midline and thyroid not enlarged, symmetric, no tenderness/mass/nodules Lymph nodes: Cervical, supraclavicular, and axillary nodes normal. Resp: clear to auscultation bilaterally Back: symmetric, no curvature. ROM normal. No CVA tenderness. Cardio: regular rate and rhythm, S1, S2 normal, no murmur, click, rub or gallop GI: soft, non-tender; bowel sounds normal; no masses,  no organomegaly Extremities: extremities normal, atraumatic, no cyanosis or edema Neurologic: Alert and oriented X 3, normal strength and tone. Normal symmetric reflexes. Normal coordination and gait  ECOG PERFORMANCE STATUS: 1 - Symptomatic but completely ambulatory  Blood pressure (!) 132/59, pulse 79, temperature 98.6 F (37 C), temperature source Oral, resp. rate 18, height 5\' 10"  (1.778 m), weight 197 lb 4.8 oz (89.5 kg), SpO2 100 %.  LABORATORY  DATA: Lab Results  Component Value Date   WBC 7.8 07/06/2017   HGB 11.9 (L) 07/06/2017   HCT 36.4 (L) 07/06/2017   MCV 109.7 (H) 07/06/2017   PLT 140 07/06/2017      Chemistry      Component Value Date/Time   NA 141 07/06/2017 1115   K 4.4 07/06/2017 1115   CL 105 03/30/2017 1859   CO2 25 07/06/2017 1115   BUN 14.5 07/06/2017 1115   CREATININE 1.1 07/06/2017 1115      Component Value Date/Time   CALCIUM 9.1 07/06/2017 1115   ALKPHOS 79 07/06/2017 1115   AST 42 (H) 07/06/2017 1115   ALT 48 07/06/2017 1115   BILITOT 0.34 07/06/2017 1115       RADIOGRAPHIC STUDIES: Ct Chest W Contrast  Result Date: 07/06/2017 CLINICAL DATA:  Patient with history of left-sided lung cancer diagnosed 10/17. On chemotherapy. Chronic shortness breath. EXAM: CT CHEST, ABDOMEN, AND PELVIS WITH CONTRAST TECHNIQUE: Multidetector CT imaging of the chest, abdomen and pelvis was performed following the standard protocol during bolus administration of intravenous contrast. CONTRAST:  14mL ISOVUE-300 IOPAMIDOL (ISOVUE-300) INJECTION 61% COMPARISON:  Chest CT 03/30/2017 ; CT CAP 02/15/2017. FINDINGS: CT CHEST FINDINGS Cardiovascular: Normal heart size. No pericardial effusion. Aorta and main pulmonary artery normal in caliber. Peripheral calcified atherosclerotic plaque involving the thoracic aorta. Mediastinum/Nodes: No axillary lymphadenopathy. Similar-appearing left hilar adenopathy measuring up to 10 mm (image 37; series 2). Unchanged 6 mm right hilar lymph node (image 41; series 2).  Normal morphology of the esophagus. Lungs/Pleura: Central airways are patent. Unchanged subpleural nodule within the left lower lobe measuring 11 x 8 mm (image 91; series 4), previously 11 x 8 mm. Centrilobular and paraseptal emphysematous changes. No new or enlarging pulmonary nodularity. No pleural effusion or pneumothorax. Musculoskeletal: Thoracic spine degenerative changes. No aggressive or acute appearing osseous lesions. CT  ABDOMEN PELVIS FINDINGS Hepatobiliary: Unchanged 14 mm low-attenuation lesion within the left hepatic lobe, most compatible with hepatic cysts. Gallbladder is unremarkable. No intrahepatic or extrahepatic biliary ductal dilatation. Pancreas: Unremarkable Spleen: Unremarkable Adrenals/Urinary Tract: The adrenal glands are normal. Re- demonstrated septated right renal cyst. Kidneys enhance symmetrically with contrast. No hydronephrosis. Urinary bladder is poorly visualized due to streak artifact. Stomach/Bowel: Descending and sigmoid colonic diverticulosis. No CT evidence for acute diverticulitis. Normal morphology of the stomach. No free fluid or free intraperitoneal air. Vascular/Lymphatic: Infrarenal abdominal aortic aneurysm, unchanged from prior measuring 3.3 x 3.5 cm (image 84; series 2). Stable 12 mm aortocaval lymph node (image 78; series 2). Reproductive: Prostate is unremarkable. Other: None. Musculoskeletal: Bilateral hip arthroplasties. Lumbar spine degenerative changes. No aggressive or acute appearing osseous lesions. IMPRESSION: No significant interval change in size of left hilar adenopathy and subpleural left lower lobe pulmonary nodule. No evidence for metastatic disease within the abdomen or pelvis. Stable aortocaval lymph node. Stable renal and hepatic cysts. Infrarenal abdominal aortic aneurysm, unchanged from prior. Electronically Signed   By: Lovey Newcomer M.D.   On: 07/06/2017 16:18   Ct Abdomen Pelvis W Contrast  Result Date: 07/06/2017 CLINICAL DATA:  Patient with history of left-sided lung cancer diagnosed 10/17. On chemotherapy. Chronic shortness breath. EXAM: CT CHEST, ABDOMEN, AND PELVIS WITH CONTRAST TECHNIQUE: Multidetector CT imaging of the chest, abdomen and pelvis was performed following the standard protocol during bolus administration of intravenous contrast. CONTRAST:  164mL ISOVUE-300 IOPAMIDOL (ISOVUE-300) INJECTION 61% COMPARISON:  Chest CT 03/30/2017 ; CT CAP 02/15/2017.  FINDINGS: CT CHEST FINDINGS Cardiovascular: Normal heart size. No pericardial effusion. Aorta and main pulmonary artery normal in caliber. Peripheral calcified atherosclerotic plaque involving the thoracic aorta. Mediastinum/Nodes: No axillary lymphadenopathy. Similar-appearing left hilar adenopathy measuring up to 10 mm (image 37; series 2). Unchanged 6 mm right hilar lymph node (image 41; series 2). Normal morphology of the esophagus. Lungs/Pleura: Central airways are patent. Unchanged subpleural nodule within the left lower lobe measuring 11 x 8 mm (image 91; series 4), previously 11 x 8 mm. Centrilobular and paraseptal emphysematous changes. No new or enlarging pulmonary nodularity. No pleural effusion or pneumothorax. Musculoskeletal: Thoracic spine degenerative changes. No aggressive or acute appearing osseous lesions. CT ABDOMEN PELVIS FINDINGS Hepatobiliary: Unchanged 14 mm low-attenuation lesion within the left hepatic lobe, most compatible with hepatic cysts. Gallbladder is unremarkable. No intrahepatic or extrahepatic biliary ductal dilatation. Pancreas: Unremarkable Spleen: Unremarkable Adrenals/Urinary Tract: The adrenal glands are normal. Re- demonstrated septated right renal cyst. Kidneys enhance symmetrically with contrast. No hydronephrosis. Urinary bladder is poorly visualized due to streak artifact. Stomach/Bowel: Descending and sigmoid colonic diverticulosis. No CT evidence for acute diverticulitis. Normal morphology of the stomach. No free fluid or free intraperitoneal air. Vascular/Lymphatic: Infrarenal abdominal aortic aneurysm, unchanged from prior measuring 3.3 x 3.5 cm (image 84; series 2). Stable 12 mm aortocaval lymph node (image 78; series 2). Reproductive: Prostate is unremarkable. Other: None. Musculoskeletal: Bilateral hip arthroplasties. Lumbar spine degenerative changes. No aggressive or acute appearing osseous lesions. IMPRESSION: No significant interval change in size of left  hilar adenopathy and subpleural left lower lobe pulmonary nodule. No  evidence for metastatic disease within the abdomen or pelvis. Stable aortocaval lymph node. Stable renal and hepatic cysts. Infrarenal abdominal aortic aneurysm, unchanged from prior. Electronically Signed   By: Lovey Newcomer M.D.   On: 07/06/2017 16:18    ASSESSMENT AND PLAN:  This is a very pleasant 71 years old white male with stage IV non-small cell lung cancer, adenocarcinoma with positive PDL 1 expression of 80% status post 6 cycles of treatment with [place] discontinued secondary to disease progression.  The patient underwent systemic chemotherapy with carboplatin, Alimta and Avastin status post 6 cycles. He tolerated this treatment well. He had repeat CT scan of the chest, abdomen and pelvis performed recently.  I personally and independently reviewed the scan images and discuss the results with the patient and his wife. I gave the patient the option of close monitoring and observation versus consideration of maintenance treatment with Alimta and Avastin every 3 weeks. The patient is interested in proceeding with the maintenance therapy. I discussed with him the adverse effect of this treatment including but not limited to alopecia, myelosuppression, nausea and vomiting, peripheral neuropathy, liver or renal dysfunction in addition to the adverse effect of Avastin including hypertension, proteinuria, pulmonary hemorrhage, GI perforation and wound healing daily. He is expected to start the first cycle of this treatment on 07/21/2017. I would see the patient back for follow-up visit in one month for evaluation with the start of cycle #2. For the recurrent pulmonary embolism, the patient will continue his treatment with Xarelto. For hypertension, I strongly recommended for the patient to continue monitor his blood pressure closely at home and to consult with his primary care physician for adjustment of medication if needed. He was  advised to call immediately if he has any concerning symptoms in the interval. The patient voices understanding of current disease status and treatment options and is in agreement with the current care plan. All questions were answered. The patient knows to call the clinic with any problems, questions or concerns. We can certainly see the patient much sooner if necessary.  Disclaimer: This note was dictated with voice recognition software. Similar sounding words can inadvertently be transcribed and may not be corrected upon review.

## 2017-07-09 DIAGNOSIS — L84 Corns and callosities: Secondary | ICD-10-CM | POA: Diagnosis not present

## 2017-07-20 ENCOUNTER — Encounter: Payer: Self-pay | Admitting: Pharmacist

## 2017-07-21 ENCOUNTER — Ambulatory Visit (HOSPITAL_BASED_OUTPATIENT_CLINIC_OR_DEPARTMENT_OTHER): Payer: Medicare Other

## 2017-07-21 ENCOUNTER — Other Ambulatory Visit (HOSPITAL_BASED_OUTPATIENT_CLINIC_OR_DEPARTMENT_OTHER): Payer: Medicare Other

## 2017-07-21 ENCOUNTER — Other Ambulatory Visit: Payer: Self-pay

## 2017-07-21 ENCOUNTER — Ambulatory Visit: Payer: Medicare Other

## 2017-07-21 VITALS — BP 152/85 | HR 62 | Temp 97.6°F | Resp 18

## 2017-07-21 DIAGNOSIS — C801 Malignant (primary) neoplasm, unspecified: Secondary | ICD-10-CM

## 2017-07-21 DIAGNOSIS — Z95828 Presence of other vascular implants and grafts: Secondary | ICD-10-CM

## 2017-07-21 DIAGNOSIS — Z5111 Encounter for antineoplastic chemotherapy: Secondary | ICD-10-CM | POA: Diagnosis not present

## 2017-07-21 DIAGNOSIS — C3492 Malignant neoplasm of unspecified part of left bronchus or lung: Secondary | ICD-10-CM

## 2017-07-21 DIAGNOSIS — C3432 Malignant neoplasm of lower lobe, left bronchus or lung: Secondary | ICD-10-CM

## 2017-07-21 DIAGNOSIS — I313 Pericardial effusion (noninflammatory): Secondary | ICD-10-CM

## 2017-07-21 DIAGNOSIS — Z5112 Encounter for antineoplastic immunotherapy: Secondary | ICD-10-CM | POA: Diagnosis not present

## 2017-07-21 DIAGNOSIS — I3131 Malignant pericardial effusion in diseases classified elsewhere: Secondary | ICD-10-CM

## 2017-07-21 LAB — COMPREHENSIVE METABOLIC PANEL
ALBUMIN: 3.5 g/dL (ref 3.5–5.0)
ALT: 55 U/L (ref 0–55)
AST: 37 U/L — AB (ref 5–34)
Alkaline Phosphatase: 73 U/L (ref 40–150)
Anion Gap: 9 mEq/L (ref 3–11)
BILIRUBIN TOTAL: 0.43 mg/dL (ref 0.20–1.20)
BUN: 14.8 mg/dL (ref 7.0–26.0)
CHLORIDE: 109 meq/L (ref 98–109)
CO2: 23 meq/L (ref 22–29)
CREATININE: 1 mg/dL (ref 0.7–1.3)
Calcium: 9.2 mg/dL (ref 8.4–10.4)
EGFR: 74 mL/min/{1.73_m2} — ABNORMAL LOW (ref >90)
GLUCOSE: 159 mg/dL — AB (ref 70–140)
POTASSIUM: 3.5 meq/L (ref 3.5–5.1)
SODIUM: 142 meq/L (ref 136–145)
Total Protein: 7.2 g/dL (ref 6.4–8.3)

## 2017-07-21 LAB — CBC WITH DIFFERENTIAL/PLATELET
BASO%: 0.9 % (ref 0.0–2.0)
Basophils Absolute: 0.1 10*3/uL (ref 0.0–0.1)
EOS%: 0 % (ref 0.0–7.0)
Eosinophils Absolute: 0 10*3/uL (ref 0.0–0.5)
HCT: 38.5 % (ref 38.4–49.9)
HEMOGLOBIN: 12.6 g/dL — AB (ref 13.0–17.1)
LYMPH%: 9.1 % — AB (ref 14.0–49.0)
MCH: 35.4 pg — ABNORMAL HIGH (ref 27.2–33.4)
MCHC: 32.6 g/dL (ref 32.0–36.0)
MCV: 108.4 fL — ABNORMAL HIGH (ref 79.3–98.0)
MONO#: 0.6 10*3/uL (ref 0.1–0.9)
MONO%: 4.9 % (ref 0.0–14.0)
NEUT%: 85.1 % — ABNORMAL HIGH (ref 39.0–75.0)
NEUTROS ABS: 10.4 10*3/uL — AB (ref 1.5–6.5)
Platelets: 140 10*3/uL (ref 140–400)
RBC: 3.55 10*6/uL — AB (ref 4.20–5.82)
RDW: 18.8 % — AB (ref 11.0–14.6)
WBC: 12.2 10*3/uL — AB (ref 4.0–10.3)
lymph#: 1.1 10*3/uL (ref 0.9–3.3)

## 2017-07-21 LAB — UA PROTEIN, DIPSTICK - CHCC: PROTEIN: 30 mg/dL

## 2017-07-21 MED ORDER — SODIUM CHLORIDE 0.9% FLUSH
10.0000 mL | INTRAVENOUS | Status: DC | PRN
  Administered 2017-07-21: 10 mL
  Filled 2017-07-21: qty 10

## 2017-07-21 MED ORDER — HEPARIN SOD (PORK) LOCK FLUSH 100 UNIT/ML IV SOLN
500.0000 [IU] | Freq: Once | INTRAVENOUS | Status: AC | PRN
  Administered 2017-07-21: 500 [IU]
  Filled 2017-07-21: qty 5

## 2017-07-21 MED ORDER — SODIUM CHLORIDE 0.9 % IV SOLN
Freq: Once | INTRAVENOUS | Status: AC
  Administered 2017-07-21: 13:00:00 via INTRAVENOUS

## 2017-07-21 MED ORDER — CYANOCOBALAMIN 1000 MCG/ML IJ SOLN
INTRAMUSCULAR | Status: AC
  Filled 2017-07-21: qty 1

## 2017-07-21 MED ORDER — SODIUM CHLORIDE 0.9 % IV SOLN
15.7500 mg/kg | Freq: Once | INTRAVENOUS | Status: AC
  Administered 2017-07-21: 1400 mg via INTRAVENOUS
  Filled 2017-07-21: qty 48

## 2017-07-21 MED ORDER — CYANOCOBALAMIN 1000 MCG/ML IJ SOLN
1000.0000 ug | Freq: Once | INTRAMUSCULAR | Status: AC
  Administered 2017-07-21: 1000 ug via INTRAMUSCULAR

## 2017-07-21 MED ORDER — SODIUM CHLORIDE 0.9% FLUSH
10.0000 mL | INTRAVENOUS | Status: DC | PRN
  Administered 2017-07-21: 10 mL via INTRAVENOUS
  Filled 2017-07-21: qty 10

## 2017-07-21 MED ORDER — SODIUM CHLORIDE 0.9 % IV SOLN
480.0000 mg/m2 | Freq: Once | INTRAVENOUS | Status: AC
  Administered 2017-07-21: 1000 mg via INTRAVENOUS
  Filled 2017-07-21: qty 40

## 2017-07-21 MED ORDER — PROCHLORPERAZINE MALEATE 10 MG PO TABS
ORAL_TABLET | ORAL | Status: AC
  Filled 2017-07-21: qty 1

## 2017-07-21 MED ORDER — PROCHLORPERAZINE MALEATE 10 MG PO TABS
10.0000 mg | ORAL_TABLET | Freq: Once | ORAL | Status: AC
  Administered 2017-07-21: 10 mg via ORAL

## 2017-07-21 NOTE — Patient Instructions (Signed)
[facility] Discharge Instructions for Patients Receiving Chemotherapy  Today you received the following chemotherapy agents Avastin/Alimta  To help prevent nausea and vomiting after your treatment, we encourage you to take your nausea medication as prescribed.   If you develop nausea and vomiting that is not controlled by your nausea medication, call the clinic.   BELOW ARE SYMPTOMS THAT SHOULD BE REPORTED IMMEDIATELY:  *FEVER GREATER THAN 100.5 F  *CHILLS WITH OR WITHOUT FEVER  NAUSEA AND VOMITING THAT IS NOT CONTROLLED WITH YOUR NAUSEA MEDICATION  *UNUSUAL SHORTNESS OF BREATH  *UNUSUAL BRUISING OR BLEEDING  TENDERNESS IN MOUTH AND THROAT WITH OR WITHOUT PRESENCE OF ULCERS  *URINARY PROBLEMS  *BOWEL PROBLEMS  UNUSUAL RASH Items with * indicate a potential emergency and should be followed up as soon as possible.  Feel free to call the clinic you have any questions or concerns. The clinic phone number is [phone].  Please show the [facility] at check-in to the Emergency Department and triage nurse.

## 2017-07-27 ENCOUNTER — Ambulatory Visit (INDEPENDENT_AMBULATORY_CARE_PROVIDER_SITE_OTHER): Payer: Medicare Other | Admitting: Emergency Medicine

## 2017-07-27 ENCOUNTER — Encounter: Payer: Self-pay | Admitting: Emergency Medicine

## 2017-07-27 DIAGNOSIS — R0609 Other forms of dyspnea: Secondary | ICD-10-CM | POA: Diagnosis not present

## 2017-07-27 DIAGNOSIS — C3492 Malignant neoplasm of unspecified part of left bronchus or lung: Secondary | ICD-10-CM | POA: Diagnosis not present

## 2017-07-27 DIAGNOSIS — I824Y3 Acute embolism and thrombosis of unspecified deep veins of proximal lower extremity, bilateral: Secondary | ICD-10-CM

## 2017-07-27 MED ORDER — ALBUTEROL SULFATE HFA 108 (90 BASE) MCG/ACT IN AERS: 2.0000 | INHALATION_SPRAY | RESPIRATORY_TRACT | 5 refills | Status: DC | PRN

## 2017-07-27 NOTE — Assessment & Plan Note (Signed)
Back on more traditional suppressive chemotherapy regimen with Dr. [name].

## 2017-07-27 NOTE — Addendum Note (Signed)
Addended by: Desmond Dike C on: 07/27/2017 11:31 AM   Modules accepted: Orders

## 2017-07-27 NOTE — Progress Notes (Signed)
Subjective:    Patient ID: Nicholas Hale, male    DOB: February 19, 1946, 71 y.o.   MRN: [id number]  HPI 71 year old male former smoker with previous pulmonary emboli and DVT in 2015, status post EKOS (completed 1 yr of Eliquis ) with recurrent PE and moderate pericardial effusion on 09/19/16 .  New dx lung cancer 08/2016 w/ pleural fluid cytology positive for adenocarcinoma.   TEST  10/23 - Pericardiocentesis:  Removal 860cc of serosanguineous fluid w/ drain left in place  STUDIES:  CTA chest 10/21: Several tiny bilateral pulmonary emboli. Small associated right pleural effusion and right basilar atelectasis. RV/LV ratio 0.99 compatible with intermediate risk pulmonary embolism.  Moderate size pericardial effusion. Mild emphysematous disease. 8 mm pleural based nodule opacity over the left lower lobe. Small right pleural effusion with associated atelectasis. BLE venous dopplers 10/22: There is acute, mobile thrombus noted in the right soleal vein. Acute DVT noted in the right gastrocnemius and peroneal veins and acute DVT noted in the left peroneal vein.    ROV 04/05/17 -- patient has a history of stage IV adenocarcinoma (PDL-1 80%), DVT and pulmonary embolism. He received immunotherapy, is now on conventional chemotherapy (first dose 02/24/17). He has had progressive exertional SOB, inability to walk through the house or do yardwork. Was seen in the ED 5/1 for sores in his mouth, increase SOB.  A CT-PA was done on 5/1 that I have personally reviewed. This showed no evidence of recurrent PE, a decrease in the size of his left lower lobe nodule and decreased hilar adenopathy. He remains on xarelto.   ROV 07/27/17 -- This follow-up visit for patient with a history of stage IV adenocarcinoma, chronic thromboembolic disease. He was on immunotherapy, converted over to more conventional chemotherapy. Most recent CT scan was 07/06/17 - stable LAD, primary disease site. He remains on xarelto. He had to stop it  for dental surgery 2 months ago, now back on it. He believes his dyspnea and fatigue are tolerable at this time. Did a trial of Spiriva, no real change. He is able to exert, cut the grass.     Past Medical History:  Diagnosis Date  . Abdominal aortic aneurysm ([facility])   . Arthritis    "hips; lower spine" (05/24/2014)  . Chemotherapy induced neutropenia ([place]) 03/31/2017  . Chronic fatigue 10/05/2016  . Encounter for antineoplastic chemotherapy 02/17/2017  . Hyperlipidemia   . Hypertension   . Lung cancer ([place]) 09/25/2016  . PE (pulmonary embolism) 9/15  . Pericardial effusion 09/20/2016   Malignant effusion s/p pericardial drain  . Personal history of DVT (deep vein thrombosis) 9/15   Current Outpatient Prescriptions on File Prior to Visit  Medication Sig Dispense Refill  . atorvastatin (LIPITOR) 20 MG tablet Take 1 tablet (20 mg total) by mouth daily at 6 PM. 90 tablet 3  . dexamethasone (DECADRON) 4 MG tablet 4 mg by mouth twice a day the day before, day of and day after the chemotherapy every 3 weeks. 40 tablet 1  . folic acid (FOLVITE) 1 MG tablet Take 1 tablet (1 mg total) by mouth daily. 30 tablet 1  . lidocaine-prilocaine (EMLA) cream Apply 1 application topically as needed (on port site).     . magic mouthwash SOLN Take 5 mLs by mouth 4 (four) times daily. Hydrocortisone 60 mg ,. Nystatin 30 ml Benadryl 12.5 mg/5 ml. Mix to 240 ml 240 mL 0  . prochlorperazine (COMPAZINE) 10 MG tablet Take 1 tablet (10 mg total) by mouth  every 6 (six) hours as needed for nausea or vomiting. 30 tablet 0  . Tiotropium Bromide Monohydrate (SPIRIVA RESPIMAT) 2.5 MCG/ACT AERS Inhale 2 puffs into the lungs daily. 3 Inhaler 0  . XARELTO 20 MG TABS tablet TAKE 1 TABLET BY MOUTH EVERY DAY 30 tablet 5   No current facility-administered medications on file prior to visit.      Review of Systems     Objective:   Physical Exam  . Vitals:   07/27/17 1107  BP: 120/80  Pulse: 96  SpO2: 100%  Weight: 198  lb 6.4 oz (90 kg)  Height: 5' 10" (1.778 m)   Gen: Pleasant, well-nourished, in no distress,  normal affect  ENT: No lesions,  mouth clear,  oropharynx clear, no postnasal drip  Neck: No JVD, no TMG, no carotid bruits  Lungs: No use of accessory muscles, decreased B basilar breath sounds  Cardiovascular: RRR, heart sounds normal, no murmur or gallops, no peripheral edema  Musculoskeletal: No deformities, no cyanosis or clubbing  Neuro: alert, non focal  Skin: Warm, no lesions or rashes     Assessment & Plan:  Adenocarcinoma of left lung, stage 4 (HCC) Back on more traditional suppressive chemotherapy regimen with Dr. [name].  Acute deep vein thrombosis (DVT) of proximal vein of both lower extremities (HCC) Continue Xarelto. Plan for this to be lifelong as long as tolerated.  Dyspnea on exertion Suspect multifactorial. Potential component for COPD here. He did not benefit from Spiriva. I will give him albuterol to use as needed.  Baltazar Apo, MD, PhD 07/27/2017, 11:27 AM [facility] [phone] or if no answer [phone]

## 2017-07-27 NOTE — Assessment & Plan Note (Signed)
Suspect multifactorial. Potential component for COPD here. He did not benefit from Spiriva. I will give him albuterol to use as needed.

## 2017-07-27 NOTE — Assessment & Plan Note (Signed)
Continue Xarelto. Plan for this to be lifelong as long as tolerated.

## 2017-07-27 NOTE — Patient Instructions (Addendum)
Please continue your Xarelto as you are taking it.  Continue your chemotherapy regimen as per Dr Worthy Flank plans.  We will start albuterol, to be use 2 puffs up to 4 hours if needed for shortness of breath Follow with Dr Lamonte Sakai in 6 months or sooner if you have any problems

## 2017-07-30 ENCOUNTER — Telehealth: Payer: Self-pay | Admitting: *Deleted

## 2017-07-30 DIAGNOSIS — K121 Other forms of stomatitis: Secondary | ICD-10-CM

## 2017-07-30 MED ORDER — MAGIC MOUTHWASH: 5.0000 mL | Freq: Four times a day (QID) | ORAL | 0 refills | Status: DC

## 2017-07-30 NOTE — Telephone Encounter (Signed)
FYI "This is my second time with thrush started Wednesday(07-28-2017).  [facility] has faxed refill but no response.  Need Magic Mouth Wash refill [facility] on [address].  Hurts way down my throat.  Wife saw white spots on the , roof/palate and white fuzzy  Tongue."  This nurse called [facility].  Per [facility] ingredients used per label with last order prednisone 80 mg, Nystatin 80 mg, diphenhydramine 80 mg.  New laws we are not allowed to crush pills when compounding."  Refill provided with this call.    Returned call to patient, wife informed of refill in addition to instructions to rinse mouth after eating, perform good mouth care with alcohol free products like Biotene.  Try salt/baking soda mouth rinse/gargling. Asked for return call with any progression.  No further questions. Avastin/Alimta received last on 07-21-2017.

## 2017-08-03 ENCOUNTER — Other Ambulatory Visit: Payer: Self-pay | Admitting: *Deleted

## 2017-08-03 DIAGNOSIS — K121 Other forms of stomatitis: Secondary | ICD-10-CM

## 2017-08-03 MED ORDER — MAGIC MOUTHWASH: 5.0000 mL | Freq: Four times a day (QID) | ORAL | 1 refills | Status: DC

## 2017-08-11 ENCOUNTER — Ambulatory Visit (HOSPITAL_BASED_OUTPATIENT_CLINIC_OR_DEPARTMENT_OTHER): Payer: Medicare Other | Admitting: Oncology

## 2017-08-11 ENCOUNTER — Telehealth: Payer: Self-pay

## 2017-08-11 ENCOUNTER — Other Ambulatory Visit (HOSPITAL_BASED_OUTPATIENT_CLINIC_OR_DEPARTMENT_OTHER): Payer: Medicare Other

## 2017-08-11 ENCOUNTER — Encounter: Payer: Self-pay | Admitting: Oncology

## 2017-08-11 ENCOUNTER — Ambulatory Visit: Payer: Medicare Other

## 2017-08-11 ENCOUNTER — Ambulatory Visit (HOSPITAL_BASED_OUTPATIENT_CLINIC_OR_DEPARTMENT_OTHER): Payer: Medicare Other

## 2017-08-11 VITALS — BP 153/68 | HR 68 | Temp 97.7°F | Resp 18 | Ht 70.0 in | Wt 199.5 lb

## 2017-08-11 VITALS — BP 155/80

## 2017-08-11 DIAGNOSIS — Z5111 Encounter for antineoplastic chemotherapy: Secondary | ICD-10-CM

## 2017-08-11 DIAGNOSIS — C801 Malignant (primary) neoplasm, unspecified: Secondary | ICD-10-CM

## 2017-08-11 DIAGNOSIS — C3492 Malignant neoplasm of unspecified part of left bronchus or lung: Secondary | ICD-10-CM

## 2017-08-11 DIAGNOSIS — I3131 Malignant pericardial effusion in diseases classified elsewhere: Secondary | ICD-10-CM

## 2017-08-11 DIAGNOSIS — Z95828 Presence of other vascular implants and grafts: Secondary | ICD-10-CM

## 2017-08-11 DIAGNOSIS — C3432 Malignant neoplasm of lower lobe, left bronchus or lung: Secondary | ICD-10-CM

## 2017-08-11 DIAGNOSIS — Z5112 Encounter for antineoplastic immunotherapy: Secondary | ICD-10-CM | POA: Diagnosis present

## 2017-08-11 DIAGNOSIS — I313 Pericardial effusion (noninflammatory): Principal | ICD-10-CM

## 2017-08-11 LAB — COMPREHENSIVE METABOLIC PANEL
ALBUMIN: 3.4 g/dL — AB (ref 3.5–5.0)
ALK PHOS: 86 U/L (ref 40–150)
ALT: 72 U/L — ABNORMAL HIGH (ref 0–55)
AST: 39 U/L — AB (ref 5–34)
Anion Gap: 10 mEq/L (ref 3–11)
BILIRUBIN TOTAL: 0.46 mg/dL (ref 0.20–1.20)
BUN: 15.5 mg/dL (ref 7.0–26.0)
CO2: 21 mEq/L — ABNORMAL LOW (ref 22–29)
CREATININE: 1 mg/dL (ref 0.7–1.3)
Calcium: 8.9 mg/dL (ref 8.4–10.4)
Chloride: 109 mEq/L (ref 98–109)
EGFR: 72 mL/min/{1.73_m2} — ABNORMAL LOW (ref >90)
GLUCOSE: 139 mg/dL (ref 70–140)
Potassium: 4 mEq/L (ref 3.5–5.1)
SODIUM: 139 meq/L (ref 136–145)
TOTAL PROTEIN: 7.2 g/dL (ref 6.4–8.3)

## 2017-08-11 LAB — CBC WITH DIFFERENTIAL/PLATELET
BASO%: 0 % (ref 0.0–2.0)
Basophils Absolute: 0 10*3/uL (ref 0.0–0.1)
EOS ABS: 0 10*3/uL (ref 0.0–0.5)
EOS%: 0 % (ref 0.0–7.0)
HEMATOCRIT: 38.9 % (ref 38.4–49.9)
HEMOGLOBIN: 12.2 g/dL — AB (ref 13.0–17.1)
LYMPH#: 1.1 10*3/uL (ref 0.9–3.3)
LYMPH%: 10.8 % — ABNORMAL LOW (ref 14.0–49.0)
MCH: 34.5 pg — ABNORMAL HIGH (ref 27.2–33.4)
MCHC: 31.4 g/dL — ABNORMAL LOW (ref 32.0–36.0)
MCV: 109.9 fL — ABNORMAL HIGH (ref 79.3–98.0)
MONO#: 0.7 10*3/uL (ref 0.1–0.9)
MONO%: 6.6 % (ref 0.0–14.0)
NEUT%: 82.6 % — ABNORMAL HIGH (ref 39.0–75.0)
NEUTROS ABS: 8.2 10*3/uL — AB (ref 1.5–6.5)
Platelets: 140 10*3/uL (ref 140–400)
RBC: 3.54 10*6/uL — ABNORMAL LOW (ref 4.20–5.82)
RDW: 17.3 % — AB (ref 11.0–14.6)
WBC: 9.9 10*3/uL (ref 4.0–10.3)

## 2017-08-11 LAB — UA PROTEIN, DIPSTICK - CHCC: Protein, ur: <30 mg/dL

## 2017-08-11 MED ORDER — PROCHLORPERAZINE MALEATE 10 MG PO TABS
ORAL_TABLET | ORAL | Status: AC
Start: 2017-08-11 — End: 2017-08-11
  Filled 2017-08-11: qty 1

## 2017-08-11 MED ORDER — SODIUM CHLORIDE 0.9% FLUSH
10.0000 mL | INTRAVENOUS | Status: DC | PRN
  Administered 2017-08-11: 10 mL
  Filled 2017-08-11: qty 10

## 2017-08-11 MED ORDER — SODIUM CHLORIDE 0.9 % IV SOLN
Freq: Once | INTRAVENOUS | Status: AC
  Administered 2017-08-11: 11:00:00 via INTRAVENOUS

## 2017-08-11 MED ORDER — HEPARIN SOD (PORK) LOCK FLUSH 100 UNIT/ML IV SOLN
500.0000 [IU] | Freq: Once | INTRAVENOUS | Status: AC | PRN
  Administered 2017-08-11: 500 [IU]
  Filled 2017-08-11: qty 5

## 2017-08-11 MED ORDER — PROCHLORPERAZINE MALEATE 10 MG PO TABS
10.0000 mg | ORAL_TABLET | Freq: Once | ORAL | Status: AC
  Administered 2017-08-11: 10 mg via ORAL

## 2017-08-11 MED ORDER — SODIUM CHLORIDE 0.9% FLUSH
10.0000 mL | INTRAVENOUS | Status: DC | PRN
  Administered 2017-08-11: 10 mL via INTRAVENOUS
  Filled 2017-08-11: qty 10

## 2017-08-11 MED ORDER — SODIUM CHLORIDE 0.9 % IV SOLN
520.0000 mg/m2 | Freq: Once | INTRAVENOUS | Status: AC
  Administered 2017-08-11: 1100 mg via INTRAVENOUS
  Filled 2017-08-11: qty 40

## 2017-08-11 MED ORDER — SODIUM CHLORIDE 0.9 % IV SOLN
15.5800 mg/kg | Freq: Once | INTRAVENOUS | Status: AC
  Administered 2017-08-11: 1400 mg via INTRAVENOUS
  Filled 2017-08-11: qty 48

## 2017-08-11 NOTE — Progress Notes (Signed)
[facility] Follow up:    Northwood, [name], MD [place] [address]   DIAGNOSIS: Cancer Staging Adenocarcinoma of left lung, stage 4 ([place]) Staging form: Lung, AJCC 7th Edition - Clinical stage from 10/05/2016: Stage IV (T1a, N3, M1b) - Signed by [name], MD on 10/05/2016  Stage IV (T1a, N3, M1b) non-small cell lung cancer, adenocarcinoma with PDL 1 expression of 80%. 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  SUMMARY OF ONCOLOGIC HISTORY:  No history exists.   PRIOR THERAPY:  1) Treatment with immunotherapy with [name] (pembrolizumab) 200 MG IV every 3 weeks status post 6 cycles discontinued secondary to disease progression. 2) Systemic chemotherapy with carboplatin for AUC of 5, Alimta 500 MG/M2 and Avastin 15 MG/KG every 3 weeks. First dose 02/24/2017. Status post 6 cycles. Last dose was given 06/09/2017.  CURRENT THERAPY: Maintenance treatment with Alimta 500 MG/M2 and Avastin 15 MG/KG every 3 weeks. First dose 07/21/2017.  INTERVAL HISTORY: Nicholas Hale 71 y.o. male returns for routine visit by himself. The patient has been feeling fine with exception of having some cold symptoms over the past week. Reports that his symptoms are improving. He has not had any fevers or chills. He does have a nonproductive cough and runny nose at times. Using Mucinex which is effective. Otherwise, he reports that he did quite well with his first cycle of maintenance treatment. He reports that his breathing improved. Denies sore throat. Denies chest pain and shortness of breath. Denies hemoptysis.Denies abdominal pain, nausea, vomiting. Denies constipation and diarrhea. The patient is here for evaluation prior to cycle 2 of his Alimta and Avastin.   Patient Active  Problem List   Diagnosis Date Noted  . Dyspnea on exertion 04/05/2017  . Chemotherapy induced neutropenia ([place]) 03/31/2017  . Port catheter in place 03/03/2017  . Encounter for antineoplastic chemotherapy 02/17/2017  . Adenocarcinoma of left lung, stage 4 ([place]) 10/05/2016  . Goals of care, counseling/discussion 10/05/2016  . Chronic fatigue 10/05/2016  . Lung cancer ([place]) 09/25/2016  . Acute deep vein thrombosis (DVT) of proximal vein of both lower extremities (HCC)   . Cardiac tamponade   . Elevated troponin 09/20/2016  . 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 Ventricular tachycardia ([place]) 09/19/2016  . Renal cyst, right 09/19/2016  . Liver lesion 09/19/2016  . Nonsustained ventricular tachycardia ([place])   . History of tobacco abuse 01/28/2016  . Primary osteoarthritis of right hip 05/10/2015  . 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 Arthritis of left hip 05/23/2014  . Hypertension 05/15/2014  . Hyperlipidemia 05/15/2014    has No Known Allergies.  MEDICAL HISTORY: Past Medical History:  Diagnosis Date  . Abdominal aortic aneurysm ([place])   . Arthritis    "hips; lower spine" (05/24/2014)  . Chemotherapy induced neutropenia ([place]) 03/31/2017  . Chronic fatigue 10/05/2016  . Encounter for antineoplastic chemotherapy 02/17/2017  . Hyperlipidemia   . Hypertension   . Lung cancer ([place]) 09/25/2016  . PE (pulmonary embolism) 9/15  . Pericardial effusion 09/20/2016   Malignant effusion s/p pericardial drain  . Personal history of DVT (deep vein thrombosis) 9/15    SURGICAL HISTORY: Past Surgical History:  Procedure Laterality Date  . ANTERIOR FUSION CERVICAL SPINE  ~ 2005  . APPENDECTOMY  1978  . CARDIAC CATHETERIZATION N/A 09/21/2016   Procedure: Pericardiocentesis;  Surgeon:  Jettie Booze, MD;  Location: [facility];  Service: Cardiovascular;  Laterality: N/A;  . COLONOSCOPY    . HEMIARTHROPLASTY SHOULDER FRACTURE Left 12/2008  . INGUINAL  HERNIA REPAIR Bilateral 1992  . IR GENERIC HISTORICAL  02/01/2017   IR FLUORO GUIDE PORT INSERTION RIGHT 02/01/2017 Arne Cleveland, MD [facility]  . IR GENERIC HISTORICAL  02/01/2017   IR US GUIDE VASC ACCESS RIGHT 02/01/2017 Arne Cleveland, MD [facility]  . JOINT REPLACEMENT    . PERICARDIAL FLUID DRAINAGE  09/21/2016  . TOTAL HIP ARTHROPLASTY Left 05/23/2014   Procedure: TOTAL HIP ARTHROPLASTY;  Surgeon: Kerin Salen, MD;  Location: [facility];  Service: Orthopedics;  Laterality: Left;  . TOTAL HIP ARTHROPLASTY Right 05/15/2015   Procedure: TOTAL HIP ARTHROPLASTY;  Surgeon: Frederik Pear, MD;  Location: [facility];  Service: Orthopedics;  Laterality: Right;    SOCIAL HISTORY: Social History   Social History  . Marital status: Married    Spouse name: N/A  . Number of children: N/A  . Years of education: N/A   Occupational History  . Not on file.   Social History Main Topics  . Smoking status: Former Smoker    Packs/day: 0.50    Years: 40.00    Types: Cigarettes    Quit date: 2010  . Smokeless tobacco: Never Used  . Alcohol use No  . Drug use: No  . Sexual activity: No   Other Topics Concern  . Not on file   Social History Narrative  . No narrative on file    FAMILY HISTORY: Family History  Problem Relation Age of Onset  . Clotting disorder Mother   . Diabetes Mellitus I Mother   . Prostate cancer Father     Review of Systems  Constitutional: Negative for appetite change, chills, fatigue, fever and unexpected weight change.  HENT:  Negative.   Eyes: Negative.   Respiratory: Positive for cough. Negative for hemoptysis and shortness of breath.   Cardiovascular: Negative.   Gastrointestinal: Negative.   Genitourinary: Negative.    Musculoskeletal: Negative.   Skin: Negative.   Neurological: Negative.   Hematological: Negative.   Psychiatric/Behavioral: Negative.       PHYSICAL EXAMINATION  ECOG PERFORMANCE STATUS: 1 - Symptomatic but completely ambulatory  Vitals:    08/11/17 1009  BP: (!) 153/68  Pulse: 68  Resp: 18  Temp: 97.7 F (36.5 C)  SpO2: 100%    Physical Exam  Constitutional: He is oriented to person, place, and time and well-developed, well-nourished, and in no distress. No distress.  HENT:  Head: Normocephalic.  Mouth/Throat: Oropharynx is clear and moist. No oropharyngeal exudate.  Eyes: Conjunctivae are normal. Right eye exhibits no discharge. Left eye exhibits no discharge. No scleral icterus.  Neck: Normal range of motion. Neck supple.  Cardiovascular: Normal rate, regular rhythm, normal heart sounds and intact distal pulses.   Pulmonary/Chest: Effort normal and breath sounds normal. No respiratory distress. He has no wheezes. He has no rales.  Abdominal: Soft. Bowel sounds are normal. He exhibits no distension and no mass. There is no tenderness.  Musculoskeletal: Normal range of motion. He exhibits no edema.  Lymphadenopathy:    He has no cervical adenopathy.  Neurological: He is alert and oriented to person, place, and time. He exhibits normal muscle tone. Gait normal.  Skin: Skin is warm and dry. No rash noted. He is not diaphoretic. No erythema. No pallor.  Psychiatric: Mood, memory, affect and judgment normal.  Vitals reviewed.  LABORATORY DATA:  CBC    Component Value Date/Time   WBC 9.9 08/11/2017 0931   WBC 2.7 (L) 03/30/2017 1859   RBC 3.54 (L) 08/11/2017 0931   RBC 4.36 03/30/2017 1859   HGB 12.2 (L) 08/11/2017 0931   HCT 38.9 08/11/2017 0931   PLT 140 08/11/2017 0931   MCV 109.9 (H) 08/11/2017 0931   MCH 34.5 (H) 08/11/2017 0931   MCH 32.6 03/30/2017 1859   MCHC 31.4 (L) 08/11/2017 0931   MCHC 33.9 03/30/2017 1859   RDW 17.3 (H) 08/11/2017 0931   LYMPHSABS 1.1 08/11/2017 0931   MONOABS 0.7 08/11/2017 0931   EOSABS 0.0 08/11/2017 0931   BASOSABS 0.0 08/11/2017 0931    CMP     Component Value Date/Time   NA 139 08/11/2017 0931   K 4.0 08/11/2017 0931   CL 105 03/30/2017 1859   CO2 21 (L)  08/11/2017 0931   GLUCOSE 139 08/11/2017 0931   BUN 15.5 08/11/2017 0931   CREATININE 1.0 08/11/2017 0931   CALCIUM 8.9 08/11/2017 0931   PROT 7.2 08/11/2017 0931   ALBUMIN 3.4 (L) 08/11/2017 0931   AST 39 (H) 08/11/2017 0931   ALT 72 (H) 08/11/2017 0931   ALKPHOS 86 08/11/2017 0931   BILITOT 0.46 08/11/2017 0931   GFRNONAA >60 03/30/2017 1859   GFRAA >60 03/30/2017 1859    RADIOGRAPHIC STUDIES:  No results found.  ASSESSMENT and THERAPY PLAN:   Adenocarcinoma of left lung, stage 4 ([place]) This is a very pleasant 71 year old white male with stage IV non-small cell lung cancer, adenocarcinoma with positive PDL 1 expression of 80% status post 6 cycles of treatment with [place] discontinued secondary to disease progression.  The patient underwent systemic chemotherapy with carboplatin, Alimta and Avastin status post 6 cycles. He tolerated this treatment well. He had repeat CT scan of the chest, abdomen and pelvis performed recently.  The patient is now receiving maintenance therapy with Alimta and Avastin every 3 weeks. Tolerated the first cycle well.Recommend that he proceed with cycle 2 as scheduled today. Liver enzymes are mildly elevated, but are adequate to proceed with treatment today. We will continue to monitor these.  For his cold symptoms, he may continue to use Mucinex over-the-counter. He will let us know if his symptoms worsen.  Follow-up visit will be in 3 weeks for reevaluation prior to cycle 3 of his maintenance therapy. He was advised to call immediately if he has any concerning symptoms in the interval.  Plan reviewed with Dr. [name].   No orders of the defined types were placed in this encounter.   All questions were answered. The patient knows to call the clinic with any problems, questions or concerns. We can certainly see the patient much sooner if necessary.  Mikey Bussing, NP 08/11/2017

## 2017-08-11 NOTE — Patient Instructions (Signed)
[facility] Discharge Instructions for Patients Receiving Chemotherapy  Today you received the following chemotherapy agents Avastin/Alimta  To help prevent nausea and vomiting after your treatment, we encourage you to take your nausea medication as prescribed.   If you develop nausea and vomiting that is not controlled by your nausea medication, call the clinic.   BELOW ARE SYMPTOMS THAT SHOULD BE REPORTED IMMEDIATELY:  *FEVER GREATER THAN 100.5 F  *CHILLS WITH OR WITHOUT FEVER  NAUSEA AND VOMITING THAT IS NOT CONTROLLED WITH YOUR NAUSEA MEDICATION  *UNUSUAL SHORTNESS OF BREATH  *UNUSUAL BRUISING OR BLEEDING  TENDERNESS IN MOUTH AND THROAT WITH OR WITHOUT PRESENCE OF ULCERS  *URINARY PROBLEMS  *BOWEL PROBLEMS  UNUSUAL RASH Items with * indicate a potential emergency and should be followed up as soon as possible.  Feel free to call the clinic you have any questions or concerns. The clinic phone number is [phone].  Please show the [facility] at check-in to the Emergency Department and triage nurse.

## 2017-08-11 NOTE — Patient Instructions (Signed)
Implanted Port Home Guide An implanted port is a type of central line that is placed under the skin. Central lines are used to provide IV access when treatment or nutrition needs to be given through a person's veins. Implanted ports are used for long-term IV access. An implanted port may be placed because:  You need IV medicine that would be irritating to the small veins in your hands or arms.  You need long-term IV medicines, such as antibiotics.  You need IV nutrition for a long period.  You need frequent blood draws for lab tests.  You need dialysis.  Implanted ports are usually placed in the chest area, but they can also be placed in the upper arm, the abdomen, or the leg. An implanted port has two main parts:  Reservoir. The reservoir is round and will appear as a small, raised area under your skin. The reservoir is the part where a needle is inserted to give medicines or draw blood.  Catheter. The catheter is a thin, flexible tube that extends from the reservoir. The catheter is placed into a large vein. Medicine that is inserted into the reservoir goes into the catheter and then into the vein.  How will I care for my incision site? Do not get the incision site wet. Bathe or shower as directed by your health care provider. How is my port accessed? Special steps must be taken to access the port:  Before the port is accessed, a numbing cream can be placed on the skin. This helps numb the skin over the port site.  Your health care provider uses a sterile technique to access the port. ? Your health care provider must put on a mask and sterile gloves. ? The skin over your port is cleaned carefully with an antiseptic and allowed to dry. ? The port is gently pinched between sterile gloves, and a needle is inserted into the port.  Only "non-coring" port needles should be used to access the port. Once the port is accessed, a blood return should be checked. This helps ensure that the port  is in the vein and is not clogged.  If your port needs to remain accessed for a constant infusion, a clear (transparent) bandage will be placed over the needle site. The bandage and needle will need to be changed every week, or as directed by your health care provider.  Keep the bandage covering the needle clean and dry. Do not get it wet. Follow your health care provider's instructions on how to take a shower or bath while the port is accessed.  If your port does not need to stay accessed, no bandage is needed over the port.  What is flushing? Flushing helps keep the port from getting clogged. Follow your health care provider's instructions on how and when to flush the port. Ports are usually flushed with saline solution or a medicine called heparin. The need for flushing will depend on how the port is used.  If the port is used for intermittent medicines or blood draws, the port will need to be flushed: ? After medicines have been given. ? After blood has been drawn. ? As part of routine maintenance.  If a constant infusion is running, the port may not need to be flushed.  How long will my port stay implanted? The port can stay in for as long as your health care provider thinks it is needed. When it is time for the port to come out, surgery will be   done to remove it. The procedure is similar to the one performed when the port was put in. When should I seek immediate medical care? When you have an implanted port, you should seek immediate medical care if:  You notice a bad smell coming from the incision site.  You have swelling, redness, or drainage at the incision site.  You have more swelling or pain at the port site or the surrounding area.  You have a fever that is not controlled with medicine.  This information is not intended to replace advice given to you by your health care provider. Make sure you discuss any questions you have with your health care provider. Document  Released: 11/16/2005 Document Revised: 04/23/2016 Document Reviewed: 07/24/2013 Elsevier Interactive Patient Education  2017 [employer].  

## 2017-08-11 NOTE — Telephone Encounter (Signed)
Gave patient avs and calender for upcoming appointment for October. Per 9/12 los

## 2017-08-11 NOTE — Assessment & Plan Note (Addendum)
This is a very pleasant 71 year old white male with stage IV non-small cell lung cancer, adenocarcinoma with positive PDL 1 expression of 80% status post 6 cycles of treatment with [place] discontinued secondary to disease progression.  The patient underwent systemic chemotherapy with carboplatin, Alimta and Avastin status post 6 cycles. He tolerated this treatment well. He had repeat CT scan of the chest, abdomen and pelvis performed recently.  The patient is now receiving maintenance therapy with Alimta and Avastin every 3 weeks. Tolerated the first cycle well.Recommend that he proceed with cycle 2 as scheduled today. Liver enzymes are mildly elevated, but are adequate to proceed with treatment today. We will continue to monitor these.  For his cold symptoms, he may continue to use Mucinex over-the-counter. He will let us know if his symptoms worsen.  Follow-up visit will be in 3 weeks for reevaluation prior to cycle 3 of his maintenance therapy. He was advised to call immediately if he has any concerning symptoms in the interval.  Plan reviewed with Dr. [name].

## 2017-08-18 ENCOUNTER — Telehealth: Payer: Self-pay

## 2017-08-18 NOTE — Telephone Encounter (Signed)
Readjusted provider time to 30 min visit. Per 9/19 message reply

## 2017-08-21 ENCOUNTER — Encounter (HOSPITAL_COMMUNITY): Payer: Self-pay

## 2017-08-21 ENCOUNTER — Observation Stay (HOSPITAL_COMMUNITY)
Admission: EM | Admit: 2017-08-21 | Discharge: 2017-08-22 | Disposition: A | Discharge status: Home / Self Care | Payer: Medicare Other | Attending: Family Medicine | Admitting: Family Medicine

## 2017-08-21 ENCOUNTER — Emergency Department (HOSPITAL_COMMUNITY): Payer: Medicare Other

## 2017-08-21 DIAGNOSIS — Z87891 Personal history of nicotine dependence: Secondary | ICD-10-CM | POA: Insufficient documentation

## 2017-08-21 DIAGNOSIS — Z7901 Long term (current) use of anticoagulants: Secondary | ICD-10-CM | POA: Insufficient documentation

## 2017-08-21 DIAGNOSIS — D6959 Other secondary thrombocytopenia: Secondary | ICD-10-CM | POA: Diagnosis not present

## 2017-08-21 DIAGNOSIS — T451X5A Adverse effect of antineoplastic and immunosuppressive drugs, initial encounter: Secondary | ICD-10-CM | POA: Insufficient documentation

## 2017-08-21 DIAGNOSIS — K1379 Other lesions of oral mucosa: Secondary | ICD-10-CM | POA: Diagnosis not present

## 2017-08-21 DIAGNOSIS — Z79899 Other long term (current) drug therapy: Secondary | ICD-10-CM | POA: Diagnosis not present

## 2017-08-21 DIAGNOSIS — D696 Thrombocytopenia, unspecified: Secondary | ICD-10-CM | POA: Diagnosis present

## 2017-08-21 DIAGNOSIS — C3490 Malignant neoplasm of unspecified part of unspecified bronchus or lung: Secondary | ICD-10-CM | POA: Insufficient documentation

## 2017-08-21 DIAGNOSIS — I10 Essential (primary) hypertension: Secondary | ICD-10-CM | POA: Diagnosis not present

## 2017-08-21 DIAGNOSIS — Z86718 Personal history of other venous thrombosis and embolism: Secondary | ICD-10-CM | POA: Insufficient documentation

## 2017-08-21 DIAGNOSIS — K068 Other specified disorders of gingiva and edentulous alveolar ridge: Secondary | ICD-10-CM | POA: Diagnosis not present

## 2017-08-21 DIAGNOSIS — E785 Hyperlipidemia, unspecified: Secondary | ICD-10-CM | POA: Diagnosis not present

## 2017-08-21 DIAGNOSIS — Y9289 Other specified places as the place of occurrence of the external cause: Secondary | ICD-10-CM | POA: Diagnosis not present

## 2017-08-21 DIAGNOSIS — D61818 Other pancytopenia: Secondary | ICD-10-CM | POA: Diagnosis not present

## 2017-08-21 DIAGNOSIS — I2699 Other pulmonary embolism without acute cor pulmonale: Secondary | ICD-10-CM | POA: Diagnosis present

## 2017-08-21 DIAGNOSIS — D709 Neutropenia, unspecified: Secondary | ICD-10-CM | POA: Diagnosis not present

## 2017-08-21 LAB — CBC WITH DIFFERENTIAL/PLATELET
BASOS ABS: 0 10*3/uL (ref 0.0–0.1)
Basophils Relative: 0 %
EOS PCT: 0 %
Eosinophils Absolute: 0 10*3/uL (ref 0.0–0.7)
HEMATOCRIT: 35.9 % — AB (ref 39.0–52.0)
HEMOGLOBIN: 11.2 g/dL — AB (ref 13.0–17.0)
LYMPHS ABS: 1.3 10*3/uL (ref 0.7–4.0)
LYMPHS PCT: 52 %
MCH: 33.8 pg (ref 26.0–34.0)
MCHC: 31.2 g/dL (ref 30.0–36.0)
MCV: 108.5 fL — AB (ref 78.0–100.0)
MONOS PCT: 31 %
Monocytes Absolute: 0.8 10*3/uL (ref 0.1–1.0)
Neutro Abs: 0.4 10*3/uL — ABNORMAL LOW (ref 1.7–7.7)
Neutrophils Relative %: 17 %
Platelets: 25 10*3/uL — CL (ref 150–400)
RBC: 3.31 MIL/uL — AB (ref 4.22–5.81)
RDW: 16.9 % — AB (ref 11.5–15.5)
WBC: 2.5 10*3/uL — AB (ref 4.0–10.5)

## 2017-08-21 LAB — COMPREHENSIVE METABOLIC PANEL
ALBUMIN: 3.2 g/dL — AB (ref 3.5–5.0)
ALT: 112 U/L — ABNORMAL HIGH (ref 17–63)
ANION GAP: 6 (ref 5–15)
AST: 70 U/L — AB (ref 15–41)
Alkaline Phosphatase: 71 U/L (ref 38–126)
BUN: 21 mg/dL — AB (ref 6–20)
CHLORIDE: 106 mmol/L (ref 101–111)
CO2: 26 mmol/L (ref 22–32)
Calcium: 8.5 mg/dL — ABNORMAL LOW (ref 8.9–10.3)
Creatinine, Ser: 1.15 mg/dL (ref 0.61–1.24)
GFR calc Af Amer: >60 mL/min (ref >60)
Glucose, Bld: 108 mg/dL — ABNORMAL HIGH (ref 65–99)
POTASSIUM: 4.4 mmol/L (ref 3.5–5.1)
Sodium: 138 mmol/L (ref 135–145)
Total Bilirubin: 0.8 mg/dL (ref 0.3–1.2)
Total Protein: 6.5 g/dL (ref 6.5–8.1)

## 2017-08-21 LAB — PLATELET COUNT: PLATELETS: 54 10*3/uL — AB (ref 150–400)

## 2017-08-21 LAB — I-STAT CG4 LACTIC ACID, ED: LACTIC ACID, VENOUS: 1.32 mmol/L (ref 0.5–1.9)

## 2017-08-21 MED ORDER — ACETAMINOPHEN 650 MG RE SUPP: 650.0000 mg | Freq: Four times a day (QID) | RECTAL | Status: DC | PRN

## 2017-08-21 MED ORDER — ONDANSETRON HCL 4 MG PO TABS: 4.0000 mg | ORAL_TABLET | Freq: Four times a day (QID) | ORAL | Status: DC | PRN

## 2017-08-21 MED ORDER — PROCHLORPERAZINE MALEATE 10 MG PO TABS
10.0000 mg | ORAL_TABLET | Freq: Four times a day (QID) | ORAL | Status: DC | PRN
  Filled 2017-08-21: qty 1

## 2017-08-21 MED ORDER — TIOTROPIUM BROMIDE MONOHYDRATE 2.5 MCG/ACT IN AERS: 2.0000 | INHALATION_SPRAY | Freq: Every day | RESPIRATORY_TRACT | Status: DC

## 2017-08-21 MED ORDER — SODIUM CHLORIDE 0.9 % IV SOLN: 10.0000 mL/h | Freq: Once | INTRAVENOUS | Status: DC

## 2017-08-21 MED ORDER — MAGIC MOUTHWASH
5.0000 mL | Freq: Four times a day (QID) | ORAL | Status: DC
  Administered 2017-08-21 – 2017-08-22 (×3): 5 mL via ORAL
  Filled 2017-08-21 (×3): qty 5

## 2017-08-21 MED ORDER — ALBUTEROL SULFATE (2.5 MG/3ML) 0.083% IN NEBU: 2.5000 mg | INHALATION_SOLUTION | RESPIRATORY_TRACT | Status: DC | PRN

## 2017-08-21 MED ORDER — ACETAMINOPHEN 325 MG PO TABS: 650.0000 mg | ORAL_TABLET | Freq: Four times a day (QID) | ORAL | Status: DC | PRN

## 2017-08-21 MED ORDER — ONDANSETRON HCL 4 MG/2ML IJ SOLN: 4.0000 mg | Freq: Four times a day (QID) | INTRAMUSCULAR | Status: DC | PRN

## 2017-08-21 MED ORDER — ATORVASTATIN CALCIUM 20 MG PO TABS
20.0000 mg | ORAL_TABLET | Freq: Every day | ORAL | Status: DC
Start: 2017-08-21 — End: 2017-08-22
  Administered 2017-08-21: 20 mg via ORAL
  Filled 2017-08-21: qty 1

## 2017-08-21 NOTE — ED Triage Notes (Signed)
Pt comes in from home with complaint of bleeding in the gums. He is currently being treated at [facility] cancer center for lung CA. Pt takes xarelto. Pt also reports cough and cold sx for 2 weeks.

## 2017-08-21 NOTE — ED Notes (Signed)
Dr. Jeanell Sparrow notified that platelets resulted at 25.

## 2017-08-21 NOTE — Progress Notes (Signed)
Nicholas Hale is a 71 y.o. male patient admitted from ED awake, alert - oriented  X 4 - no acute distress noted.  VSS - Blood pressure (!) 143/69, pulse 76, temperature 98.3 F (36.8 C), temperature source Oral, resp. rate 17, height 5\' 10"  (1.778 m), SpO2 100 %.    IV in place, occlusive dsg intact without redness.  Orientation to room, and floor completed with information packet given to patient/family.  Patient declined safety video at this time.  Admission INP armband ID verified with patient/family, and in place.   SR up x 2, fall assessment complete, with patient and family able to verbalize understanding of risk associated with falls, and verbalized understanding to call nsg before up out of bed.  Call light within reach, patient able to voice, and demonstrate understanding.  Skin, clean-dry- intact without evidence of bruising, or skin tears.   No evidence of skin break down noted on exam.     Will cont to eval and treat per MD orders.  Luci Bank, RN 08/21/2017 7:57 PM

## 2017-08-21 NOTE — H&P (Signed)
History and Physical  Nicholas Hale YHC:[id number] DOB: 12-01-1945 DOA: 08/21/2017  Referring physician: Dr Jeanell Sparrow, ED physician PCP: [name], MD  Outpatient Specialists:   Lamonte Sakai Fatima [facility]anger)  [name] (Onc)  Patient Coming From: home  Chief Complaint: gingival bleeding  HPI: Nicholas Hale is a 71 y.o. male with a history of lung cancer on chemotherapy, hypertension, hyperlipidemia, history of recurrent PEs on xarelto. Patient seen in the emergency room for spontaneous bleeding from the gingiva the front incisors. Attempts to tamponade the bleeding unsuccessful. No other palliating or provoking factors.  Emergency Department Course: Blood work shows platelets 25, down from a baseline of 140. ER physician talked with oncology, who recommended observation under the medicine service, transfusion of platelets, then recheck.  Review of Systems:   Pt denies any fevers, chills, nausea, vomiting, diarrhea, constipation, abdominal pain, shortness of breath, dyspnea on exertion, orthopnea, cough, wheezing, palpitations, headache, vision changes, lightheadedness, dizziness, melena, rectal bleeding.  Review of systems are otherwise negative  Past Medical History:  Diagnosis Date  . Abdominal aortic aneurysm ([place])   . Arthritis    "hips; lower spine" (05/24/2014)  . Chemotherapy induced neutropenia ([place]) 03/31/2017  . Chronic fatigue 10/05/2016  . Encounter for antineoplastic chemotherapy 02/17/2017  . Hyperlipidemia   . Hypertension   . Lung cancer ([place]) 09/25/2016  . PE (pulmonary embolism) 9/15  . Pericardial effusion 09/20/2016   Malignant effusion s/p pericardial drain  . Personal history of DVT (deep vein thrombosis) 9/15   Past Surgical History:  Procedure Laterality Date  . ANTERIOR FUSION CERVICAL SPINE  ~ 2005  . APPENDECTOMY  1978  . CARDIAC CATHETERIZATION N/A 09/21/2016   Procedure: Pericardiocentesis;  Surgeon: Jettie Booze, MD;  Location: [facility];   Service: Cardiovascular;  Laterality: N/A;  . COLONOSCOPY    . HEMIARTHROPLASTY SHOULDER FRACTURE Left 12/2008  . INGUINAL HERNIA REPAIR Bilateral 1992  . IR GENERIC HISTORICAL  02/01/2017   IR FLUORO GUIDE PORT INSERTION RIGHT 02/01/2017 Arne Cleveland, MD [facility]  . IR GENERIC HISTORICAL  02/01/2017   IR US GUIDE VASC ACCESS RIGHT 02/01/2017 Arne Cleveland, MD [facility]  . JOINT REPLACEMENT    . PERICARDIAL FLUID DRAINAGE  09/21/2016  . TOTAL HIP ARTHROPLASTY Left 05/23/2014   Procedure: TOTAL HIP ARTHROPLASTY;  Surgeon: Kerin Salen, MD;  Location: [facility];  Service: Orthopedics;  Laterality: Left;  . TOTAL HIP ARTHROPLASTY Right 05/15/2015   Procedure: TOTAL HIP ARTHROPLASTY;  Surgeon: Frederik Pear, MD;  Location: [facility];  Service: Orthopedics;  Laterality: Right;   Social History:  reports that he quit smoking about 8 years ago. His smoking use included Cigarettes. He has a 20.00 pack-year smoking history. He has never used smokeless tobacco. He reports that he does not drink alcohol or use drugs. Patient lives at Home  No Known Allergies  Family History  Problem Relation Age of Onset  . Clotting disorder Mother   . Diabetes Mellitus I Mother   . Prostate cancer Father       Prior to Admission medications   Medication Sig Start Date End Date Taking? Authorizing Provider  albuterol (PROVENTIL HFA;VENTOLIN HFA) 108 (90 Base) MCG/ACT inhaler Inhale 2 puffs into the lungs every 4 (four) hours as needed for wheezing or shortness of breath. 07/27/17  Yes Byrum, Rose Fillers, MD  atorvastatin (LIPITOR) 20 MG tablet Take 1 tablet (20 mg total) by mouth daily at 6 PM. 10/02/16  Yes End, Harrell Gave, MD  dexamethasone (  DECADRON) 4 MG tablet 4 mg by mouth twice a day the day before, day of and day after the chemotherapy every 3 weeks. 02/17/17  Yes [name], MD  folic acid (FOLVITE) 1 MG tablet Take 1 tablet (1 mg total) by mouth daily. 04/16/17  Yes [name], MD  guaiFENesin  (ROBITUSSIN) 100 MG/5ML liquid Take 200 mg by mouth 3 (three) times daily as needed for cough.   Yes [provider]  lidocaine-prilocaine (EMLA) cream Apply 1 application topically as needed (on port site).    Yes [provider]  magic mouthwash SOLN Take 5 mLs by mouth 4 (four) times daily. Prednisone 80 mg , Nystatin 30 ml, Benadryl 12.5 mg/5 ml. Mix to 240 ml, 1:1:1 concentration. 08/03/17  Yes [name], MD  prochlorperazine (COMPAZINE) 10 MG tablet Take 1 tablet (10 mg total) by mouth every 6 (six) hours as needed for nausea or vomiting. 10/05/16  Yes [name], MD  XARELTO 20 MG TABS tablet TAKE 1 TABLET BY MOUTH EVERY DAY Patient taking differently: TAKE 20 MG BY MOUTH EVERY DAY 05/17/17  Yes Byrum, Rose Fillers, MD  Tiotropium Bromide Monohydrate (SPIRIVA RESPIMAT) 2.5 MCG/ACT AERS Inhale 2 puffs into the lungs daily. Patient not taking: Reported on 08/11/2017 04/05/17   Collene Gobble, MD    Physical Exam: BP 125/68   Pulse 73   Temp 98.4 F (36.9 C) (Oral)   Resp 16   SpO2 99%   General: Elderly Caucasian male.. Awake and alert and oriented x3. No acute cardiopulmonary distress.  HEENT: Normocephalic atraumatic.  Right and left ears normal in appearance.  Pupils equal, round, reactive to light. Extraocular muscles are intact. Sclerae anicteric and noninjected.  Moist mucosal membranes. No mucosal lesions. Spontaneous bleeding from the gingiva of front lower incisors Neck: Neck supple without lymphadenopathy. No carotid bruits. No masses palpated.  Cardiovascular: Regular rate with normal S1-S2 sounds. No murmurs, rubs, gallops auscultated. No JVD.  Respiratory: Good respiratory effort with no wheezes, rales, rhonchi. Lungs clear to auscultation bilaterally.  No accessory muscle use. Abdomen: Soft, nontender, nondistended. Active bowel sounds. No masses or hepatosplenomegaly  Skin: No rashes, lesions, or ulcerations.  Dry, warm to touch. 2+ dorsalis pedis and  radial pulses. Musculoskeletal: No calf or leg pain. All major joints not erythematous nontender.  No upper or lower joint deformation.  Good ROM.  No contractures  Psychiatric: Intact judgment and insight. Pleasant and cooperative. Neurologic: No focal neurological deficits. Strength is 5/5 and symmetric in upper and lower extremities.  Cranial nerves II through XII are grossly intact.           Labs on Admission: I have personally reviewed following labs and imaging studies  CBC:  Recent Labs Lab 08/21/17 0926  WBC 2.5*  NEUTROABS 0.4*  HGB 11.2*  HCT 35.9*  MCV 108.5*  PLT 25*   Basic Metabolic Panel:  Recent Labs Lab 08/21/17 0926  NA 138  K 4.4  CL 106  CO2 26  GLUCOSE 108*  BUN 21*  CREATININE 1.15  CALCIUM 8.5*   GFR: Estimated Creatinine Clearance: 66.7 mL/min (by C-G formula based on SCr of 1.15 mg/dL). Liver Function Tests:  Recent Labs Lab 08/21/17 0926  AST 70*  ALT 112*  ALKPHOS 71  BILITOT 0.8  PROT 6.5  ALBUMIN 3.2*   No results for input(s): LIPASE, AMYLASE in the last 168 hours. No results for input(s): AMMONIA in the last 168 hours. Coagulation Profile: No results for input(s): INR,  PROTIME in the last 168 hours. Cardiac Enzymes: No results for input(s): CKTOTAL, CKMB, CKMBINDEX, TROPONINI in the last 168 hours. BNP (last 3 results) No results for input(s): PROBNP in the last 8760 hours. HbA1C: No results for input(s): HGBA1C in the last 72 hours. CBG: No results for input(s): GLUCAP in the last 168 hours. Lipid Profile: No results for input(s): CHOL, HDL, LDLCALC, TRIG, CHOLHDL, LDLDIRECT in the last 72 hours. Thyroid Function Tests: No results for input(s): TSH, T4TOTAL, FREET4, T3FREE, THYROIDAB in the last 72 hours. Anemia Panel: No results for input(s): VITAMINB12, FOLATE, FERRITIN, TIBC, IRON, RETICCTPCT in the last 72 hours. Urine analysis:    Component Value Date/Time   COLORURINE YELLOW 03/30/2017 2141   APPEARANCEUR  CLEAR 03/30/2017 2141   LABSPEC 1.029 03/30/2017 2141   PHURINE 5.0 03/30/2017 2141   GLUCOSEU NEGATIVE 03/30/2017 2141   HGBUR SMALL (A) 03/30/2017 2141   BILIRUBINUR NEGATIVE 03/30/2017 2141   KETONESUR NEGATIVE 03/30/2017 2141   PROTEINUR < 30 08/11/2017 0931   UROBILINOGEN 0.2 05/06/2015 0935   NITRITE NEGATIVE 03/30/2017 2141   LEUKOCYTESUR NEGATIVE 03/30/2017 2141   Sepsis Labs: @LABRCNTIP (procalcitonin:4,lacticidven:4) )No results found for this or any previous visit (from the past 240 hour(s)).   Radiological Exams on Admission: Dg Chest 2 View  Result Date: 08/21/2017 CLINICAL DATA:  Bleeding in mouth.  History of lung cancer. EXAM: CHEST  2 VIEW COMPARISON:  CT scan July 06, 2017 and chest x-ray Mar 30, 2017 FINDINGS: The patient's known left-sided lung nodule is not visualized on this study. No pneumothorax. A right Port-A-Cath is in good position. No masses or infiltrates. The cardiomediastinal silhouette is unchanged. IMPRESSION: No active cardiopulmonary disease. 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  We also discussed care given by the ED provider.  #1 gingival bleeding  Hold Xarelto  Transfuse platelets  Platelet count this evening and tomorrow morning #2 recurrent PE  Hold Xarelto #3 hypertension  Continue antihypertensives #4 lung cancer #5 hyperlipidemia #6 thrombocytopenia  Secondary to chemotherapy  DVT prophylaxis: SCDs Consultants: None Code Status: Full code Family Communication: Wife in the room  Disposition Plan: Patient should be able to return home following admission   Jacob Moores [facility] Pager [phone]  If 7PM-7AM, please contact night-coverage [email or web address] Password [email or web address]

## 2017-08-21 NOTE — ED Provider Notes (Signed)
[facility] DEPT Provider Note   CSN: [id number] Arrival date & time: 08/21/17  3086     History   Chief Complaint Chief Complaint  Patient presents with  . Dental Injury    HPI Nicholas Hale is a 71 y.o. male.  HPI 71 year old man history of stage IV non-small cell lung cancer presents today complaining of gum bleeding. He previously been treated with carboplatin, alimta, and avastin and is currently receiving alimta and avastin 3 weeks. His last hemoglobin was 12 with platelets of 240,000. He denies any history of rectal bleeding. He has been coughing up some blood but thought this was actually blood in his from his mouth. Last chemo dose Past Medical History:  Diagnosis Date  . Abdominal aortic aneurysm ([place])   . Arthritis    "hips; lower spine" (05/24/2014)  . Chemotherapy induced neutropenia ([place]) 03/31/2017  . Chronic fatigue 10/05/2016  . Encounter for antineoplastic chemotherapy 02/17/2017  . Hyperlipidemia   . Hypertension   . Lung cancer ([place]) 09/25/2016  . PE (pulmonary embolism) 9/15  . Pericardial effusion 09/20/2016   Malignant effusion s/p pericardial drain  . Personal history of DVT (deep vein thrombosis) 9/15    Patient Active Problem List   Diagnosis Date Noted  . Dyspnea on exertion 04/05/2017  . Chemotherapy induced neutropenia ([place]) 03/31/2017  . Port catheter in place 03/03/2017  . Encounter for antineoplastic chemotherapy 02/17/2017  . Adenocarcinoma of left lung, stage 4 ([place]) 10/05/2016  . Goals of care, counseling/discussion 10/05/2016  . Chronic fatigue 10/05/2016  . Lung cancer ([place]) 09/25/2016  . Acute deep vein thrombosis (DVT) of proximal vein of both lower extremities (HCC)   . Cardiac tamponade   . Elevated troponin 09/20/2016  . 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 Ventricular tachycardia ([place]) 09/19/2016  . Renal cyst, right 09/19/2016  . Liver lesion 09/19/2016  . Nonsustained ventricular tachycardia ([place])   .  History of tobacco abuse 01/28/2016  . Primary osteoarthritis of right hip 05/10/2015  . 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 Arthritis of left hip 05/23/2014  . Hypertension 05/15/2014  . Hyperlipidemia 05/15/2014    Past Surgical History:  Procedure Laterality Date  . ANTERIOR FUSION CERVICAL SPINE  ~ 2005  . APPENDECTOMY  1978  . CARDIAC CATHETERIZATION N/A 09/21/2016   Procedure: Pericardiocentesis;  Surgeon: Jettie Booze, MD;  Location: [facility];  Service: Cardiovascular;  Laterality: N/A;  . COLONOSCOPY    . HEMIARTHROPLASTY SHOULDER FRACTURE Left 12/2008  . INGUINAL HERNIA REPAIR Bilateral 1992  . IR GENERIC HISTORICAL  02/01/2017   IR FLUORO GUIDE PORT INSERTION RIGHT 02/01/2017 Arne Cleveland, MD [facility]  . IR GENERIC HISTORICAL  02/01/2017   IR US GUIDE VASC ACCESS RIGHT 02/01/2017 Arne Cleveland, MD [facility]  . JOINT REPLACEMENT    . PERICARDIAL FLUID DRAINAGE  09/21/2016  . TOTAL HIP ARTHROPLASTY Left 05/23/2014   Procedure: TOTAL HIP ARTHROPLASTY;  Surgeon: Kerin Salen, MD;  Location: [facility];  Service: Orthopedics;  Laterality: Left;  . TOTAL HIP ARTHROPLASTY Right 05/15/2015   Procedure: TOTAL HIP ARTHROPLASTY;  Surgeon: Frederik Pear, MD;  Location: [facility];  Service: Orthopedics;  Laterality: Right;       Home Medications    Prior to Admission medications   Medication Sig Start Date End Date Taking? Authorizing Provider  albuterol (PROVENTIL HFA;VENTOLIN HFA) 108 (90 Base) MCG/ACT inhaler Inhale 2 puffs into the lungs every 4 (four) hours as needed for wheezing or shortness  of breath. 07/27/17   Collene Gobble, MD  atorvastatin (LIPITOR) 20 MG tablet Take 1 tablet (20 mg total) by mouth daily at 6 PM. 10/02/16   End, Harrell Gave, MD  dexamethasone (DECADRON) 4 MG tablet 4 mg by mouth twice a day the day before, day of and day after the chemotherapy every 3 weeks. 02/17/17   [name], MD  folic acid (FOLVITE) 1 MG tablet Take 1  tablet (1 mg total) by mouth daily. 04/16/17   [name], MD  guaiFENesin (ROBITUSSIN) 100 MG/5ML liquid Take 200 mg by mouth 3 (three) times daily as needed for cough.    [provider]  lidocaine-prilocaine (EMLA) cream Apply 1 application topically as needed (on port site).     [provider]  magic mouthwash SOLN Take 5 mLs by mouth 4 (four) times daily. Prednisone 80 mg , Nystatin 30 ml, Benadryl 12.5 mg/5 ml. Mix to 240 ml, 1:1:1 concentration. 08/03/17   [name], MD  prochlorperazine (COMPAZINE) 10 MG tablet Take 1 tablet (10 mg total) by mouth every 6 (six) hours as needed for nausea or vomiting. 10/05/16   [name], MD  Tiotropium Bromide Monohydrate (SPIRIVA RESPIMAT) 2.5 MCG/ACT AERS Inhale 2 puffs into the lungs daily. Patient not taking: Reported on 08/11/2017 04/05/17   Collene Gobble, MD  XARELTO 20 MG TABS tablet TAKE 1 TABLET BY MOUTH EVERY DAY 05/17/17   Collene Gobble, MD    Family History Family History  Problem Relation Age of Onset  . Clotting disorder Mother   . Diabetes Mellitus I Mother   . Prostate cancer Father     Social History Social History  Substance Use Topics  . Smoking status: Former Smoker    Packs/day: 0.50    Years: 40.00    Types: Cigarettes    Quit date: 2010  . Smokeless tobacco: Never Used  . Alcohol use No     Allergies   Patient has no known allergies.   Review of Systems Review of Systems   Physical Exam Updated Vital Signs BP (!) 160/83   Pulse 91   Temp 97.8 F (36.6 C) (Oral)   Resp 20   SpO2 100%   Physical Exam   ED Treatments / Results  Labs (all labs ordered are listed, but only abnormal results are displayed) Labs Reviewed  COMPREHENSIVE METABOLIC PANEL - Abnormal; Notable for the following:       Result Value   Glucose, Bld 108 (*)    BUN 21 (*)    Calcium 8.5 (*)    Albumin 3.2 (*)    AST 70 (*)    ALT 112 (*)    All other components within normal limits  CBC  WITH DIFFERENTIAL/PLATELET - Abnormal; Notable for the following:    WBC 2.5 (*)    RBC 3.31 (*)    Hemoglobin 11.2 (*)    HCT 35.9 (*)    MCV 108.5 (*)    RDW 16.9 (*)    All other components within normal limits  I-STAT CG4 LACTIC ACID, ED    EKG  EKG Interpretation None       Radiology Dg Chest 2 View  Result Date: 08/21/2017 CLINICAL DATA:  Bleeding in mouth.  History of lung cancer. EXAM: CHEST  2 VIEW COMPARISON:  CT scan July 06, 2017 and chest x-Kayon Dozier Mar 30, 2017 FINDINGS: The patient's known left-sided lung nodule is not visualized on this study. No pneumothorax. A right Port-A-Cath is  in good position. No masses or infiltrates. The cardiomediastinal silhouette is unchanged. IMPRESSION: No active cardiopulmonary disease. Electronically Signed   By: Dorise Bullion III M.D   On: 08/21/2017 09:48    Procedures Procedures (including critical care time)  Medications Ordered in ED Medications - No data to display   Initial Impression / Assessment and Plan / ED Course  I have reviewed the triage vital signs and the nursing notes.  Pertinent labs & imaging results that were available during my care of the patient were reviewed by me and considered in my medical decision making (see chart for details).     Discussed with Dr. Alen Blew.on call for oncology. Plan transfusion 1 unit of platelets. Plan admission to medicine service. Dr. Alen Blew.is available if they need any further consultation.  Vitals:   08/21/17 1045 08/21/17 1115  BP: 128/82 (!) 146/74  Pulse: 78 75  Resp:  16  Temp:    SpO2: 99% 99%   Patient with ongoing oozing from his gums. We'll continue suction as needed. Pressure was held but did not resolve bleeding. He'll be transfused 1 unit of platelets. He remains hemodynamically stable here. Discussed with Dr. Nehemiah Settle and he will see for admission Final Clinical Impressions(s) / ED Diagnoses   Final diagnoses:  Thrombocytopenia (HCC)  Gums, bleeding     New Prescriptions New Prescriptions   No medications on file     Pattricia Boss, MD 08/21/17 1241

## 2017-08-22 DIAGNOSIS — Z79899 Other long term (current) drug therapy: Secondary | ICD-10-CM | POA: Diagnosis not present

## 2017-08-22 DIAGNOSIS — I10 Essential (primary) hypertension: Secondary | ICD-10-CM | POA: Diagnosis not present

## 2017-08-22 DIAGNOSIS — Z7902 Long term (current) use of antithrombotics/antiplatelets: Secondary | ICD-10-CM

## 2017-08-22 DIAGNOSIS — D696 Thrombocytopenia, unspecified: Secondary | ICD-10-CM | POA: Diagnosis not present

## 2017-08-22 DIAGNOSIS — I2699 Other pulmonary embolism without acute cor pulmonale: Secondary | ICD-10-CM | POA: Diagnosis not present

## 2017-08-22 DIAGNOSIS — C3490 Malignant neoplasm of unspecified part of unspecified bronchus or lung: Secondary | ICD-10-CM | POA: Diagnosis not present

## 2017-08-22 DIAGNOSIS — K068 Other specified disorders of gingiva and edentulous alveolar ridge: Secondary | ICD-10-CM | POA: Diagnosis not present

## 2017-08-22 LAB — CBC
HEMATOCRIT: 35.5 % — AB (ref 39.0–52.0)
Hemoglobin: 11.2 g/dL — ABNORMAL LOW (ref 13.0–17.0)
MCH: 34.3 pg — ABNORMAL HIGH (ref 26.0–34.0)
MCHC: 31.5 g/dL (ref 30.0–36.0)
MCV: 108.6 fL — ABNORMAL HIGH (ref 78.0–100.0)
PLATELETS: 50 10*3/uL — AB (ref 150–400)
RBC: 3.27 MIL/uL — ABNORMAL LOW (ref 4.22–5.81)
RDW: 16.7 % — AB (ref 11.5–15.5)
WBC: 3.1 10*3/uL — AB (ref 4.0–10.5)

## 2017-08-22 LAB — BPAM PLATELET PHERESIS
Blood Product Expiration Date: 201809242359
ISSUE DATE / TIME: 201809221312
Unit Type and Rh: 6200

## 2017-08-22 LAB — PREPARE PLATELET PHERESIS: UNIT DIVISION: 0

## 2017-08-22 NOTE — Discharge Summary (Signed)
Physician Discharge Summary  Nicholas Hale  OZH:[id number]  DOB: 1946-05-28  DOA: 08/21/2017 PCP: [name], MD  Admit date: 08/21/2017 Discharge date: 08/22/2017  Admitted From: Home  Disposition: Home   Recommendations for Outpatient Follow-up:  1. Follow up with PCP in 1-2 weeks 2. Please obtain BMP/CBC in one week to monitor platelets and renal function   Discharge Condition: Stable  CODE STATUS: Full code  Diet recommendation: Heart Healthy   Brief/Interim Summary: Nicholas Hale 71 year old male with history of lung cancer on chemotherapy, hypertension, hyperlipidemia and history of recurrent PE's on Xarelto. Patient presented to the emergency room for spontaneous bleeding from the gingiva. Attempts component wasn't successful. Blood work shows platelet 25 down from baseline of 140. Oncology was consulted and recommended to transfuse platelets and observe. No subsequent bleeding, patient platelet up to 50. Oncology recommended to follow-up as an outpatient as this is secondary to chemotherapy. Upon discharge there was no evidence of bleeding, and patient was hemodynamically stable.  Subjective: Patient seen and examined on the day of discharge. Has no complaints. Denies any bleeding, no nose, GI or urinary bleed. Patient afebrile. Tolerating diet well. No acute events overnight.   Discharge Diagnoses/Hospital Course:  Principal Problem:   Gingival bleeding Active Problems:   Hypertension   Hyperlipidemia   Recurrent pulmonary embolism (HCC)   Lung cancer (HCC)   Thrombocytopenia (HCC)  Gingival bleeding in setting of thrombocytopenia from chemotherapy and xarelto  Treated with platelets transfusion  No further bleeding  Case discussed with oncology advised to follow up as outpatient Advised to avoid blowing nose or brush teeth rough   HTN Stable during hospital stay  Not any medication  Continue to monitor   Lung CA  Follow with Dr Earlie Server as outpatient    Recurrent PE  On Xarelto - This has been placed on hold for now due to bleeding and low platelets Discussed with oncology - recommending holding until seen as outpatient   Pancytopenia  Thrombocytopenia 2/2 chemotherapy  Transfuse 1 unit of platelets  Plt upon dc 50 No signs of bleeding  Monitor CBC in 3-5 day  Case and findings discussed with patient and family all in agreement of current plan of care    All other chronic medical condition were stable during the hospitalization.  On the day of the discharge the patient's vitals were stable, and no other acute medical condition were reported by patient. the patient was felt safe to be discharge to home  Discharge Instructions  You were cared for by a hospitalist during your hospital stay. If you have any questions about your discharge medications or the care you received while you were in the hospital after you are discharged, you can call the unit and asked to speak with the hospitalist on call if the hospitalist that took care of you is not available. Once you are discharged, your primary care physician will handle any further medical issues. Please note that NO REFILLS for any discharge medications will be authorized once you are discharged, as it is imperative that you return to your primary care physician (or establish a relationship with a primary care physician if you do not have one) for your aftercare needs so that they can reassess your need for medications and monitor your lab values.  Discharge Instructions    Call MD for:  difficulty breathing, headache or visual disturbances    Complete by:  As directed    Call MD for:  extreme fatigue  Complete by:  As directed    Call MD for:  hives    Complete by:  As directed    Call MD for:  persistant dizziness or light-headedness    Complete by:  As directed    Call MD for:  persistant nausea and vomiting    Complete by:  As directed    Call MD for:  redness, tenderness, or  signs of infection (pain, swelling, redness, odor or green/yellow discharge around incision site)    Complete by:  As directed    Call MD for:  severe uncontrolled pain    Complete by:  As directed    Call MD for:  temperature >100.4    Complete by:  As directed    Diet - low sodium heart healthy    Complete by:  As directed    Discharge instructions    Complete by:  As directed    Advised to avoid blowing his nose  Follow up with oncology in 1 week  Check labs in 1 week   Increase activity slowly    Complete by:  As directed      Allergies as of 08/22/2017   No Known Allergies     Medication List    STOP taking these medications   guaiFENesin 100 MG/5ML liquid Commonly known as:  ROBITUSSIN   Tiotropium Bromide Monohydrate 2.5 MCG/ACT Aers Commonly known as:  SPIRIVA RESPIMAT   XARELTO 20 MG Tabs tablet Generic drug:  rivaroxaban     TAKE these medications   albuterol 108 (90 Base) MCG/ACT inhaler Commonly known as:  PROVENTIL HFA;VENTOLIN HFA Inhale 2 puffs into the lungs every 4 (four) hours as needed for wheezing or shortness of breath.   atorvastatin 20 MG tablet Commonly known as:  LIPITOR Take 1 tablet (20 mg total) by mouth daily at 6 PM.   dexamethasone 4 MG tablet Commonly known as:  DECADRON 4 mg by mouth twice a day the day before, day of and day after the chemotherapy every 3 weeks.   folic acid 1 MG tablet Commonly known as:  FOLVITE Take 1 tablet (1 mg total) by mouth daily.   lidocaine-prilocaine cream Commonly known as:  EMLA Apply 1 application topically as needed (on port site).   magic mouthwash Soln Take 5 mLs by mouth 4 (four) times daily. Prednisone 80 mg , Nystatin 30 ml, Benadryl 12.5 mg/5 ml. Mix to 240 ml, 1:1:1 concentration.   prochlorperazine 10 MG tablet Commonly known as:  COMPAZINE Take 1 tablet (10 mg total) by mouth every 6 (six) hours as needed for nausea or vomiting.            Discharge Care Instructions         Start     Ordered   08/22/17 0000  Increase activity slowly     08/22/17 1047   08/22/17 0000  Diet - low sodium heart healthy     08/22/17 1047   08/22/17 0000  Discharge instructions    Comments:  Advised to avoid blowing his nose  Follow up with oncology in 1 week  Check labs in 1 week   08/22/17 1047   08/22/17 0000  Call MD for:  temperature >100.4     08/22/17 1047   08/22/17 0000  Call MD for:  persistant nausea and vomiting     08/22/17 1047   08/22/17 0000  Call MD for:  severe uncontrolled pain     08/22/17 1047   08/22/17  0000  Call MD for:  redness, tenderness, or signs of infection (pain, swelling, redness, odor or green/yellow discharge around incision site)     08/22/17 1047   08/22/17 0000  Call MD for:  difficulty breathing, headache or visual disturbances     08/22/17 1047   08/22/17 0000  Call MD for:  hives     08/22/17 1047   08/22/17 0000  Call MD for:  persistant dizziness or light-headedness     08/22/17 1047   08/22/17 0000  Call MD for:  extreme fatigue     08/22/17 1047     Follow-up Information    Rankins, [name], MD. Schedule an appointment as soon as possible for a visit in 1 week(s).   Specialty:  Family Medicine Why:  Hospital follow up  Contact information: [place] [address] [phone]        [name], MD. Call in 1 day(s).   Specialty:  Oncology Why:  Make an appointment in the next 3-5 days Contact information: [place] [address] [phone]          No Known Allergies  Consultations: None   Procedures/Studies: Dg Chest 2 View  Result Date: 08/21/2017 CLINICAL DATA:  Bleeding in mouth.  History of lung cancer. EXAM: CHEST  2 VIEW COMPARISON:  CT scan July 06, 2017 and chest x-ray Mar 30, 2017 FINDINGS: The patient's known left-sided lung nodule is not visualized on this study. No pneumothorax. A right Port-A-Cath is in good position. No masses or  infiltrates. The cardiomediastinal silhouette is unchanged. IMPRESSION: No active cardiopulmonary disease. Electronically Signed   By: Dorise Bullion III M.D   On: 08/21/2017 09:48    Discharge Exam: Vitals:   08/21/17 2230 08/22/17 0635  BP: (!) 114/53 (!) 108/57  Pulse: 78 73  Resp: 18 18  Temp: 98.2 F (36.8 C) 98.5 F (36.9 C)  SpO2: 100% 95%   Vitals:   08/21/17 1500 08/21/17 1716 08/21/17 2230 08/22/17 0635  BP:  (!) 143/69 (!) 114/53 (!) 108/57  Pulse:  76 78 73  Resp: 16 17 18 18   Temp: 98.5 F (36.9 C) 98.3 F (36.8 C) 98.2 F (36.8 C) 98.5 F (36.9 C)  TempSrc: Oral Oral Oral Oral  SpO2:  100% 100% 95%  Weight:    87.9 kg (193 lb 12.8 oz)  Height:  5\' 10"  (1.778 m)      General: Pt is alert, awake, not in acute distress HEENT: OP clear, no blood noted on oral or nasal mucosa.  Cardiovascular: RRR, S1/S2 +, no rubs, no gallops Respiratory: CTA bilaterally, no wheezing, no rhonchi Abdominal: Soft, NT, ND, bowel sounds + Extremities: no edema, no cyanosis   The results of significant diagnostics from this hospitalization (including imaging, microbiology, ancillary and laboratory) are listed below for reference.     Microbiology: No results found for this or any previous visit (from the past 240 hour(s)).   Labs: BNP (last 3 results) No results for input(s): BNP in the last 8760 hours. Basic Metabolic Panel:  Recent Labs Lab 08/21/17 0926  NA 138  K 4.4  CL 106  CO2 26  GLUCOSE 108*  BUN 21*  CREATININE 1.15  CALCIUM 8.5*   Liver Function Tests:  Recent Labs Lab 08/21/17 0926  AST 70*  ALT 112*  ALKPHOS 71  BILITOT 0.8  PROT 6.5  ALBUMIN 3.2*   No results for input(s): LIPASE, AMYLASE in the last  168 hours. No results for input(s): AMMONIA in the last 168 hours. CBC:  Recent Labs Lab 08/21/17 0926 08/21/17 1855 08/22/17 0450  WBC 2.5*  --  3.1*  NEUTROABS 0.4*  --   --   HGB 11.2*  --  11.2*  HCT 35.9*  --  35.5*  MCV 108.5*   --  108.6*  PLT 25* 54* 50*   Cardiac Enzymes: No results for input(s): CKTOTAL, CKMB, CKMBINDEX, TROPONINI in the last 168 hours. BNP: Invalid input(s): POCBNP CBG: No results for input(s): GLUCAP in the last 168 hours. D-Dimer No results for input(s): DDIMER in the last 72 hours. Hgb A1c No results for input(s): HGBA1C in the last 72 hours. Lipid Profile No results for input(s): CHOL, HDL, LDLCALC, TRIG, CHOLHDL, LDLDIRECT in the last 72 hours. Thyroid function studies No results for input(s): TSH, T4TOTAL, T3FREE, THYROIDAB in the last 72 hours.  Invalid input(s): FREET3 Anemia work up No results for input(s): VITAMINB12, FOLATE, FERRITIN, TIBC, IRON, RETICCTPCT in the last 72 hours. Urinalysis    Component Value Date/Time   COLORURINE YELLOW 03/30/2017 2141   APPEARANCEUR CLEAR 03/30/2017 2141   LABSPEC 1.029 03/30/2017 2141   PHURINE 5.0 03/30/2017 2141   GLUCOSEU NEGATIVE 03/30/2017 2141   HGBUR SMALL (A) 03/30/2017 2141   BILIRUBINUR NEGATIVE 03/30/2017 2141   KETONESUR NEGATIVE 03/30/2017 2141   PROTEINUR < 30 08/11/2017 0931   UROBILINOGEN 0.2 05/06/2015 0935   NITRITE NEGATIVE 03/30/2017 2141   LEUKOCYTESUR NEGATIVE 03/30/2017 2141   Sepsis Labs Invalid input(s): PROCALCITONIN,  WBC,  LACTICIDVEN Microbiology No results found for this or any previous visit (from the past 240 hour(s)).   Time coordinating discharge: 25 minutes  SIGNED:  Chipper Oman, MD  [facility] 08/22/2017, 10:49 AM  Pager please text page via  [email or web address] Password [email or web address]

## 2017-08-22 NOTE — Progress Notes (Signed)
Nicholas Hale to be D/C'd Home per MD. Discussed with the patient and all questions fully answered.   VSS, skin clean, dry and intact without evidence of skin break down, no evidence of skin tears noted.  IV catheters discontinued intact. Site without signs and symptoms of complications. Dressing and pressure applied.   An after visit summary was printed and given to the patient.   D/c education completed with patient/family including follow up instructions, medications list, d/c activities limitations if indicated, with other d/c instructions as indicated by MD- patient able to verbalize understanding, all questions fully answered.   Patient instructed to return to ED, call 911, or Call MD for any changes in condition.  Patient escorted via [facility] and D/C home via private auto.

## 2017-08-23 ENCOUNTER — Telehealth: Payer: Self-pay | Admitting: Emergency Medicine

## 2017-08-23 ENCOUNTER — Telehealth: Payer: Self-pay | Admitting: Medical Oncology

## 2017-08-23 LAB — PATHOLOGIST SMEAR REVIEW

## 2017-08-23 NOTE — Telephone Encounter (Signed)
Lab app request sent for this week.Does he need to get back on xarelto?  The ED provider told him to hold his xarelto until he is evaluated. When does he need to get back on xarelto? I instructed pt to contact Dr Lamonte Sakai for restarting Xarelto.

## 2017-08-24 ENCOUNTER — Telehealth: Payer: Self-pay | Admitting: Internal Medicine

## 2017-08-24 NOTE — Telephone Encounter (Signed)
Called and spoke to pt. Pt was hospitalized from 08/21/17-08/22/17 for spontaneous bleeding from gingiva and thrombocytopenia. Pt is questioning when he should go back on Xarelto after hospital d/c on 9/23. Advised pt per his d/c note to stay off Xarelto till he is seen in outpt setting, it was recommended pt f/u one week after d/c. Pt has an appt on 10/3 with cancer center.   Will forward to RB as FYI.

## 2017-08-24 NOTE — Telephone Encounter (Signed)
Scheduled appt per 9/24 - patient is aware of appt date and time.

## 2017-08-24 NOTE — Telephone Encounter (Signed)
I agree, would like for him to stay off the Xarelto until he can be seen by oncology/hematology on 10/3.

## 2017-08-25 NOTE — Telephone Encounter (Signed)
Spoke with patient. He is aware to hold the Xarelto until 10/3. Verbalized understanding. Nothing else needed at time of call.

## 2017-08-26 ENCOUNTER — Other Ambulatory Visit: Payer: Self-pay | Admitting: Medical Oncology

## 2017-08-26 ENCOUNTER — Other Ambulatory Visit (HOSPITAL_BASED_OUTPATIENT_CLINIC_OR_DEPARTMENT_OTHER): Payer: Medicare Other

## 2017-08-26 DIAGNOSIS — C3432 Malignant neoplasm of lower lobe, left bronchus or lung: Secondary | ICD-10-CM | POA: Diagnosis present

## 2017-08-26 DIAGNOSIS — I313 Pericardial effusion (noninflammatory): Principal | ICD-10-CM

## 2017-08-26 DIAGNOSIS — C801 Malignant (primary) neoplasm, unspecified: Secondary | ICD-10-CM

## 2017-08-26 DIAGNOSIS — I3131 Malignant pericardial effusion in diseases classified elsewhere: Secondary | ICD-10-CM

## 2017-08-26 LAB — CBC WITH DIFFERENTIAL/PLATELET
BASO%: 0.3 % (ref 0.0–2.0)
Basophils Absolute: 0 10*3/uL (ref 0.0–0.1)
EOS ABS: 0 10*3/uL (ref 0.0–0.5)
EOS%: 0.6 % (ref 0.0–7.0)
HCT: 34.3 % — ABNORMAL LOW (ref 38.4–49.9)
HGB: 11.2 g/dL — ABNORMAL LOW (ref 13.0–17.1)
LYMPH%: 29.1 % (ref 14.0–49.0)
MCH: 35.1 pg — ABNORMAL HIGH (ref 27.2–33.4)
MCHC: 32.8 g/dL (ref 32.0–36.0)
MCV: 107 fL — AB (ref 79.3–98.0)
MONO#: 0.8 10*3/uL (ref 0.1–0.9)
MONO%: 17.6 % — AB (ref 0.0–14.0)
NEUT%: 52.4 % (ref 39.0–75.0)
NEUTROS ABS: 2.5 10*3/uL (ref 1.5–6.5)
PLATELETS: 55 10*3/uL — AB (ref 140–400)
RBC: 3.21 10*6/uL — AB (ref 4.20–5.82)
RDW: 18 % — ABNORMAL HIGH (ref 11.0–14.6)
WBC: 4.7 10*3/uL (ref 4.0–10.3)
lymph#: 1.4 10*3/uL (ref 0.9–3.3)

## 2017-08-26 LAB — COMPREHENSIVE METABOLIC PANEL
ALT: 243 U/L — ABNORMAL HIGH (ref 0–55)
ANION GAP: 8 meq/L (ref 3–11)
AST: 149 U/L — ABNORMAL HIGH (ref 5–34)
Albumin: 3.2 g/dL — ABNORMAL LOW (ref 3.5–5.0)
Alkaline Phosphatase: 88 U/L (ref 40–150)
BILIRUBIN TOTAL: 0.58 mg/dL (ref 0.20–1.20)
BUN: 15.8 mg/dL (ref 7.0–26.0)
CHLORIDE: 108 meq/L (ref 98–109)
CO2: 26 meq/L (ref 22–29)
Calcium: 8.9 mg/dL (ref 8.4–10.4)
Creatinine: 1.1 mg/dL (ref 0.7–1.3)
EGFR: 66 mL/min/{1.73_m2} — AB (ref >90)
GLUCOSE: 106 mg/dL (ref 70–140)
Potassium: 4.5 mEq/L (ref 3.5–5.1)
SODIUM: 142 meq/L (ref 136–145)
TOTAL PROTEIN: 6.9 g/dL (ref 6.4–8.3)

## 2017-09-01 ENCOUNTER — Ambulatory Visit (HOSPITAL_BASED_OUTPATIENT_CLINIC_OR_DEPARTMENT_OTHER): Payer: Medicare Other

## 2017-09-01 ENCOUNTER — Ambulatory Visit: Payer: Medicare Other

## 2017-09-01 ENCOUNTER — Ambulatory Visit (HOSPITAL_BASED_OUTPATIENT_CLINIC_OR_DEPARTMENT_OTHER): Payer: Medicare Other | Admitting: Oncology

## 2017-09-01 ENCOUNTER — Other Ambulatory Visit (HOSPITAL_BASED_OUTPATIENT_CLINIC_OR_DEPARTMENT_OTHER): Payer: Medicare Other

## 2017-09-01 ENCOUNTER — Encounter: Payer: Self-pay | Admitting: Oncology

## 2017-09-01 VITALS — BP 157/69 | HR 63 | Temp 97.8°F | Resp 18 | Ht 70.0 in | Wt 197.0 lb

## 2017-09-01 DIAGNOSIS — I3131 Malignant pericardial effusion in diseases classified elsewhere: Secondary | ICD-10-CM

## 2017-09-01 DIAGNOSIS — Z7901 Long term (current) use of anticoagulants: Secondary | ICD-10-CM

## 2017-09-01 DIAGNOSIS — C801 Malignant (primary) neoplasm, unspecified: Secondary | ICD-10-CM

## 2017-09-01 DIAGNOSIS — Z23 Encounter for immunization: Secondary | ICD-10-CM | POA: Diagnosis not present

## 2017-09-01 DIAGNOSIS — Z5111 Encounter for antineoplastic chemotherapy: Secondary | ICD-10-CM

## 2017-09-01 DIAGNOSIS — C3492 Malignant neoplasm of unspecified part of left bronchus or lung: Secondary | ICD-10-CM

## 2017-09-01 DIAGNOSIS — I2699 Other pulmonary embolism without acute cor pulmonale: Secondary | ICD-10-CM | POA: Diagnosis not present

## 2017-09-01 DIAGNOSIS — C3432 Malignant neoplasm of lower lobe, left bronchus or lung: Secondary | ICD-10-CM | POA: Diagnosis not present

## 2017-09-01 DIAGNOSIS — Z5112 Encounter for antineoplastic immunotherapy: Secondary | ICD-10-CM

## 2017-09-01 DIAGNOSIS — Z95828 Presence of other vascular implants and grafts: Secondary | ICD-10-CM

## 2017-09-01 DIAGNOSIS — I313 Pericardial effusion (noninflammatory): Principal | ICD-10-CM

## 2017-09-01 LAB — CBC WITH DIFFERENTIAL/PLATELET
BASO%: 0 % (ref 0.0–2.0)
BASOS ABS: 0 10*3/uL (ref 0.0–0.1)
EOS ABS: 0 10*3/uL (ref 0.0–0.5)
EOS%: 0 % (ref 0.0–7.0)
HEMATOCRIT: 36 % — AB (ref 38.4–49.9)
HEMOGLOBIN: 11.6 g/dL — AB (ref 13.0–17.1)
LYMPH#: 0.7 10*3/uL — AB (ref 0.9–3.3)
LYMPH%: 7.5 % — ABNORMAL LOW (ref 14.0–49.0)
MCH: 35.9 pg — ABNORMAL HIGH (ref 27.2–33.4)
MCHC: 32.2 g/dL (ref 32.0–36.0)
MCV: 111.5 fL — ABNORMAL HIGH (ref 79.3–98.0)
MONO#: 0.7 10*3/uL (ref 0.1–0.9)
MONO%: 7.5 % (ref 0.0–14.0)
NEUT#: 7.9 10*3/uL — ABNORMAL HIGH (ref 1.5–6.5)
NEUT%: 85 % — ABNORMAL HIGH (ref 39.0–75.0)
NRBC: 0 % (ref 0–0)
PLATELETS: 175 10*3/uL (ref 140–400)
RBC: 3.23 10*6/uL — ABNORMAL LOW (ref 4.20–5.82)
RDW: 19.4 % — AB (ref 11.0–14.6)
WBC: 9.3 10*3/uL (ref 4.0–10.3)

## 2017-09-01 LAB — COMPREHENSIVE METABOLIC PANEL
ALBUMIN: 3.5 g/dL (ref 3.5–5.0)
ALT: 85 U/L — ABNORMAL HIGH (ref 0–55)
AST: 35 U/L — ABNORMAL HIGH (ref 5–34)
Alkaline Phosphatase: 91 U/L (ref 40–150)
Anion Gap: 11 mEq/L (ref 3–11)
BUN: 20.4 mg/dL (ref 7.0–26.0)
CO2: 20 mEq/L — ABNORMAL LOW (ref 22–29)
CREATININE: 1.2 mg/dL (ref 0.7–1.3)
Calcium: 9 mg/dL (ref 8.4–10.4)
Chloride: 109 mEq/L (ref 98–109)
EGFR: 60 mL/min/{1.73_m2} — AB (ref >90)
Glucose: 136 mg/dl (ref 70–140)
Potassium: 4 mEq/L (ref 3.5–5.1)
Sodium: 141 mEq/L (ref 136–145)
TOTAL PROTEIN: 7.7 g/dL (ref 6.4–8.3)
Total Bilirubin: 0.41 mg/dL (ref 0.20–1.20)

## 2017-09-01 LAB — UA PROTEIN, DIPSTICK - CHCC: Protein, ur: 30 mg/dL

## 2017-09-01 MED ORDER — SODIUM CHLORIDE 0.9 % IV SOLN
385.0000 mg/m2 | Freq: Once | INTRAVENOUS | Status: AC
  Administered 2017-09-01: 800 mg via INTRAVENOUS
  Filled 2017-09-01: qty 32

## 2017-09-01 MED ORDER — SODIUM CHLORIDE 0.9% FLUSH
10.0000 mL | INTRAVENOUS | Status: DC | PRN
  Administered 2017-09-01: 10 mL
  Filled 2017-09-01: qty 10

## 2017-09-01 MED ORDER — INFLUENZA VAC SPLIT QUAD 0.5 ML IM SUSY
0.5000 mL | PREFILLED_SYRINGE | Freq: Once | INTRAMUSCULAR | Status: AC
  Administered 2017-09-01: 0.5 mL via INTRAMUSCULAR
  Filled 2017-09-01: qty 0.5

## 2017-09-01 MED ORDER — PROCHLORPERAZINE MALEATE 10 MG PO TABS
ORAL_TABLET | ORAL | Status: AC
  Filled 2017-09-01: qty 1

## 2017-09-01 MED ORDER — PROCHLORPERAZINE MALEATE 10 MG PO TABS
10.0000 mg | ORAL_TABLET | Freq: Once | ORAL | Status: AC
  Administered 2017-09-01: 10 mg via ORAL

## 2017-09-01 MED ORDER — SODIUM CHLORIDE 0.9 % IV SOLN
15.7000 mg/kg | Freq: Once | INTRAVENOUS | Status: AC
  Administered 2017-09-01: 1400 mg via INTRAVENOUS
  Filled 2017-09-01: qty 48

## 2017-09-01 MED ORDER — SODIUM CHLORIDE 0.9 % IV SOLN: Freq: Once | INTRAVENOUS | Status: DC

## 2017-09-01 MED ORDER — SODIUM CHLORIDE 0.9% FLUSH
10.0000 mL | INTRAVENOUS | Status: DC | PRN
  Administered 2017-09-01: 10 mL via INTRAVENOUS
  Filled 2017-09-01: qty 10

## 2017-09-01 MED ORDER — HEPARIN SOD (PORK) LOCK FLUSH 100 UNIT/ML IV SOLN
500.0000 [IU] | Freq: Once | INTRAVENOUS | Status: AC | PRN
  Administered 2017-09-01: 500 [IU]
  Filled 2017-09-01: qty 5

## 2017-09-01 NOTE — Progress Notes (Signed)
[facility] Follow up:    [name], MD [place] [address]   DIAGNOSIS: Stage IV (T1a, N3, M1b) non-small cell lung cancer, adenocarcinoma with PDL 1 expression of 80%. 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  SUMMARY OF ONCOLOGIC HISTORY:  No history exists.   PRIOR THERAPY:  1) Treatment with immunotherapy with [name] (pembrolizumab) 200 MG IV every 3 weeks status post 6 cycles discontinued secondary to disease progression. 2) Systemic chemotherapy with carboplatin for AUC of 5, Alimta 500 MG/M2 and Avastin 15 MG/KG every 3 weeks. First dose 02/24/2017. Status post 6cycles.Last dose was given 06/09/2017.  CURRENT THERAPY: Maintenance treatment with Alimta 500 MG/M2 and Avastin 15 MG/KG every 3 weeks. First dose 07/21/2017. Status post 2 cycles.  INTERVAL HISTORY: URHO RIO 71 y.o. male returns for routine follow-up with his wife. The patient is feeling fine today without any specific complaints.He was hospitalized since his last visit with [place] due to thrombocytopenia. He had bleeding gums and platelet count was down to 25,000. He was given a unit of platelets and his Xarelto was placed on hold. The patient is not had further bleeding. Denies fevers and chills. Denies chest pain, shortness of breath at rest, and hemoptysis. He has an ongoing cough without hemoptysis. Denies nausea, vomiting, diarrhea, constipation. The patient is here for evaluation prior to cycle 3 of his maintenance Alimta and Avastin.   Patient Active Problem List   Diagnosis Date Noted  . Influenza vaccination administered at current visit 09/02/2017  . Gingival bleeding 08/21/2017  . Thrombocytopenia ([place]) 08/21/2017  . Dyspnea on exertion 04/05/2017  . Chemotherapy  induced neutropenia ([place]) 03/31/2017  . Port catheter in place 03/03/2017  . Encounter for antineoplastic chemotherapy 02/17/2017  . Adenocarcinoma of left lung, stage 4 ([place]) 10/05/2016  . Goals of care, counseling/discussion 10/05/2016  . Chronic fatigue 10/05/2016  . Lung cancer ([place]) 09/25/2016  . Acute deep vein thrombosis (DVT) of proximal vein of both lower extremities (HCC)   . Cardiac tamponade   . Elevated troponin 09/20/2016  . 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 Ventricular tachycardia ([place]) 09/19/2016  . Renal cyst, right 09/19/2016  . Liver lesion 09/19/2016  . Nonsustained ventricular tachycardia ([place])   . History of tobacco abuse 01/28/2016  . Primary osteoarthritis of right hip 05/10/2015  . 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 Arthritis of left hip 05/23/2014  . Hypertension 05/15/2014  . Hyperlipidemia 05/15/2014    has No Known Allergies.  MEDICAL HISTORY: Past Medical History:  Diagnosis Date  . Abdominal aortic aneurysm ([place])   . Arthritis    "hips; lower spine" (05/24/2014)  . Chemotherapy induced neutropenia ([place]) 03/31/2017  . Chronic fatigue 10/05/2016  . Encounter for antineoplastic chemotherapy 02/17/2017  . Hyperlipidemia   . Hypertension   . Lung cancer ([place]) 09/25/2016  . PE (pulmonary embolism) 9/15  . Pericardial effusion 09/20/2016   Malignant effusion s/p pericardial drain  . Personal history of DVT (deep vein thrombosis) 9/15    SURGICAL HISTORY: Past Surgical History:  Procedure Laterality Date  . ANTERIOR FUSION CERVICAL SPINE  ~ 2005  . APPENDECTOMY  1978  . CARDIAC CATHETERIZATION N/A 09/21/2016   Procedure: Pericardiocentesis;  Surgeon: Jettie Booze, MD;  Location: [facility];  Service: Cardiovascular;  Laterality: N/A;  . COLONOSCOPY    .  HEMIARTHROPLASTY SHOULDER FRACTURE Left 12/2008  . INGUINAL HERNIA REPAIR Bilateral 1992  . IR GENERIC HISTORICAL  02/01/2017   IR FLUORO GUIDE PORT  INSERTION RIGHT 02/01/2017 Arne Cleveland, MD [facility]  . IR GENERIC HISTORICAL  02/01/2017   IR US GUIDE VASC ACCESS RIGHT 02/01/2017 Arne Cleveland, MD [facility]  . JOINT REPLACEMENT    . PERICARDIAL FLUID DRAINAGE  09/21/2016  . TOTAL HIP ARTHROPLASTY Left 05/23/2014   Procedure: TOTAL HIP ARTHROPLASTY;  Surgeon: Kerin Salen, MD;  Location: [facility];  Service: Orthopedics;  Laterality: Left;  . TOTAL HIP ARTHROPLASTY Right 05/15/2015   Procedure: TOTAL HIP ARTHROPLASTY;  Surgeon: Frederik Pear, MD;  Location: [facility];  Service: Orthopedics;  Laterality: Right;    SOCIAL HISTORY: Social History   Social History  . Marital status: Married    Spouse name: N/A  . Number of children: N/A  . Years of education: N/A   Occupational History  . Not on file.   Social History Main Topics  . Smoking status: Former Smoker    Packs/day: 0.50    Years: 40.00    Types: Cigarettes    Quit date: 2010  . Smokeless tobacco: Never Used  . Alcohol use No  . Drug use: No  . Sexual activity: No   Other Topics Concern  . Not on file   Social History Narrative  . No narrative on file    FAMILY HISTORY: Family History  Problem Relation Age of Onset  . Clotting disorder Mother   . Diabetes Mellitus I Mother   . Prostate cancer Father     Review of Systems  Constitutional: Negative.   HENT:  Negative.   Eyes: Negative.   Respiratory: Positive for cough. Negative for hemoptysis.        Denies shortness of breath at rest. Reports dyspnea on exertion.  Cardiovascular: Negative.   Gastrointestinal: Negative.   Genitourinary: Negative.    Musculoskeletal: Negative.   Skin: Negative.   Neurological: Negative.   Hematological: Negative.   Psychiatric/Behavioral: Negative.       PHYSICAL EXAMINATION  ECOG PERFORMANCE STATUS: 1 - Symptomatic but completely ambulatory  Vitals:   09/01/17 1251  BP: (!) 157/69  Pulse: 63  Resp: 18  Temp: 97.8 F (36.6 C)  SpO2: 100%    Physical  Exam  Constitutional: He is oriented to person, place, and time and well-developed, well-nourished, and in no distress. No distress.  HENT:  Head: Normocephalic.  Mouth/Throat: Oropharynx is clear and moist. No oropharyngeal exudate.  Eyes: Conjunctivae are normal. Right eye exhibits no discharge. Left eye exhibits no discharge. No scleral icterus.  Neck: Normal range of motion. Neck supple.  Cardiovascular: Normal rate, regular rhythm, normal heart sounds and intact distal pulses.   Pulmonary/Chest: Effort normal. No respiratory distress. He has wheezes. He has no rales.  Abdominal: Soft. Bowel sounds are normal. He exhibits no distension and no mass. There is no tenderness.  Musculoskeletal: Normal range of motion. He exhibits no edema.  Lymphadenopathy:    He has no cervical adenopathy.  Neurological: He is alert and oriented to person, place, and time. He exhibits normal muscle tone. Gait normal. Coordination normal.  Skin: Skin is warm and dry. No rash noted. He is not diaphoretic. No erythema. No pallor.  Psychiatric: Mood, memory, affect and judgment normal.  Vitals reviewed.   LABORATORY DATA:  CBC    Component Value Date/Time   WBC 9.3 09/01/2017 1144   WBC 3.1 (L) 08/22/2017  0450   RBC 3.23 (L) 09/01/2017 1144   RBC 3.27 (L) 08/22/2017 0450   HGB 11.6 (L) 09/01/2017 1144   HCT 36.0 (L) 09/01/2017 1144   PLT 175 09/01/2017 1144   MCV 111.5 (H) 09/01/2017 1144   MCH 35.9 (H) 09/01/2017 1144   MCH 34.3 (H) 08/22/2017 0450   MCHC 32.2 09/01/2017 1144   MCHC 31.5 08/22/2017 0450   RDW 19.4 (H) 09/01/2017 1144   LYMPHSABS 0.7 (L) 09/01/2017 1144   MONOABS 0.7 09/01/2017 1144   EOSABS 0.0 09/01/2017 1144   BASOSABS 0.0 09/01/2017 1144    CMP     Component Value Date/Time   NA 141 09/01/2017 1144   K 4.0 09/01/2017 1144   CL 106 08/21/2017 0926   CO2 20 (L) 09/01/2017 1144   GLUCOSE 136 09/01/2017 1144   BUN 20.4 09/01/2017 1144   CREATININE 1.2 09/01/2017 1144    CALCIUM 9.0 09/01/2017 1144   PROT 7.7 09/01/2017 1144   ALBUMIN 3.5 09/01/2017 1144   AST 35 (H) 09/01/2017 1144   ALT 85 (H) 09/01/2017 1144   ALKPHOS 91 09/01/2017 1144   BILITOT 0.41 09/01/2017 1144   GFRNONAA >60 08/21/2017 0926   GFRAA >60 08/21/2017 0926    RADIOGRAPHIC STUDIES:  No results found.  ASSESSMENT and THERAPY PLAN:   Adenocarcinoma of left lung, stage 4 ([place]) This is a very pleasant 71 year old white male with stage IV non-small cell lung cancer, adenocarcinoma with positive PDL 1 expression of 80% status post 6 cycles of treatment with [place] discontinued secondary to disease progression.  The patient underwentsystemic chemotherapy with carboplatin, Alimta and Avastin status post 6cycles.He tolerated this treatment well. He had repeat CT scan of the chest, abdomen and pelvis performed recently.  The patient is now receiving maintenance therapy with Alimta and Avastin every 3 weeks. Patient was hospitalized following cycle 2 due to thrombocytopenia. His white count has now completely recovered. Discussed with Dr. [name] and we will reduce his Alimta dose to 400 mg/m. He will proceed with cycle 3 at the lower dose. He was instructed to call us should he develop any bleeding.  Patient will have a restaging CT scan of the chest, abdomen, pelvis prior to his next visit.  Liver enzymes are mildly elevated, but are adequate to proceed with treatment today. We will continue to monitor these.  Follow-up visit will be in 3 weeks for reevaluation prior to cycle 4 of his maintenance therapy and to review his restaging CT scan results.  He was advised to call immediately if he has any concerning symptoms in the interval.  Plan reviewed with Dr. [name].  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 He has been placed on Xarelto by pulmonology. Xarelto was placed on hold during hospitalization due to pancytopenia and bleeding. Platelet count has  now recovered. Advised patient to resume Xarelto.    Orders Placed This Encounter  Procedures  . CT CHEST W CONTRAST    Standing Status:   Future    Standing Expiration Date:   09/01/2018    Order Specific Question:   If indicated for the ordered procedure, I authorize the administration of contrast media per Radiology protocol    Answer:   Yes    Order Specific Question:   Preferred imaging location?    Answer:   [facility]    Order Specific Question:   Radiology Contrast Protocol - do NOT remove file path    Answer:   \\  [email or web address]    Order Specific Question:   Reason for Exam additional comments    Answer:   lung cancer restaging.  Marland Kitchen CT ABDOMEN PELVIS W CONTRAST    Standing Status:   Future    Standing Expiration Date:   09/01/2018    Order Specific Question:   If indicated for the ordered procedure, I authorize the administration of contrast media per Radiology protocol    Answer:   Yes    Order Specific Question:   Preferred imaging location?    Answer:   [facility]    Order Specific Question:   Radiology Contrast Protocol - do NOT remove file path    Answer:   \\[email or web address]    Order Specific Question:   Reason for Exam additional comments    Answer:   lung cancer. restaging.    All questions were answered. The patient knows to call the clinic with any problems, questions or concerns. We can certainly see the patient much sooner if necessary.  Mikey Bussing, NP 09/02/2017

## 2017-09-01 NOTE — Patient Instructions (Signed)
[facility] Discharge Instructions for Patients Receiving Chemotherapy  Today you received the following chemotherapy agents Avastin/Alimta  To help prevent nausea and vomiting after your treatment, we encourage you to take your nausea medication as prescribed.   If you develop nausea and vomiting that is not controlled by your nausea medication, call the clinic.   BELOW ARE SYMPTOMS THAT SHOULD BE REPORTED IMMEDIATELY:  *FEVER GREATER THAN 100.5 F  *CHILLS WITH OR WITHOUT FEVER  NAUSEA AND VOMITING THAT IS NOT CONTROLLED WITH YOUR NAUSEA MEDICATION  *UNUSUAL SHORTNESS OF BREATH  *UNUSUAL BRUISING OR BLEEDING  TENDERNESS IN MOUTH AND THROAT WITH OR WITHOUT PRESENCE OF ULCERS  *URINARY PROBLEMS  *BOWEL PROBLEMS  UNUSUAL RASH Items with * indicate a potential emergency and should be followed up as soon as possible.  Feel free to call the clinic you have any questions or concerns. The clinic phone number is [phone].  Please show the [facility] at check-in to the Emergency Department and triage nurse.

## 2017-09-02 DIAGNOSIS — Z23 Encounter for immunization: Secondary | ICD-10-CM | POA: Insufficient documentation

## 2017-09-02 NOTE — Assessment & Plan Note (Signed)
The patient has recurrent PE. He has been placed on Xarelto by pulmonology. Xarelto was placed on hold during hospitalization due to pancytopenia and bleeding. Platelet count has now recovered. Advised patient to resume Xarelto.

## 2017-09-02 NOTE — Assessment & Plan Note (Signed)
This is a very pleasant 71 year old white male with stage IV non-small cell lung cancer, adenocarcinoma with positive PDL 1 expression of 80% status post 6 cycles of treatment with [place] discontinued secondary to disease progression.  The patient underwentsystemic chemotherapy with carboplatin, Alimta and Avastin status post 6cycles.He tolerated this treatment well. He had repeat CT scan of the chest, abdomen and pelvis performed recently.  The patient is now receiving maintenance therapy with Alimta and Avastin every 3 weeks. Patient was hospitalized following cycle 2 due to thrombocytopenia. His white count has now completely recovered. Discussed with Dr. [name] and we will reduce his Alimta dose to 400 mg/m. He will proceed with cycle 3 at the lower dose. He was instructed to call us should he develop any bleeding.  Patient will have a restaging CT scan of the chest, abdomen, pelvis prior to his next visit.  Liver enzymes are mildly elevated, but are adequate to proceed with treatment today. We will continue to monitor these.  Follow-up visit will be in 3 weeks for reevaluation prior to cycle 4 of his maintenance therapy and to review his restaging CT scan results.  He was advised to call immediately if he has any concerning symptoms in the interval.  Plan reviewed with Dr. [name].

## 2017-09-20 ENCOUNTER — Ambulatory Visit (HOSPITAL_COMMUNITY)
Admission: RE | Admit: 2017-09-20 | Discharge: 2017-09-20 | Disposition: A | Discharge status: Home / Self Care | Payer: Medicare Other | Source: Ambulatory Visit | Attending: Oncology | Admitting: Oncology

## 2017-09-20 DIAGNOSIS — N281 Cyst of kidney, acquired: Secondary | ICD-10-CM | POA: Diagnosis not present

## 2017-09-20 DIAGNOSIS — I714 Abdominal aortic aneurysm, without rupture: Secondary | ICD-10-CM | POA: Diagnosis not present

## 2017-09-20 DIAGNOSIS — I700 Atherosclerosis of aorta: Secondary | ICD-10-CM | POA: Insufficient documentation

## 2017-09-20 DIAGNOSIS — J439 Emphysema, unspecified: Secondary | ICD-10-CM | POA: Diagnosis not present

## 2017-09-20 DIAGNOSIS — C3492 Malignant neoplasm of unspecified part of left bronchus or lung: Secondary | ICD-10-CM

## 2017-09-20 DIAGNOSIS — C3490 Malignant neoplasm of unspecified part of unspecified bronchus or lung: Secondary | ICD-10-CM | POA: Diagnosis not present

## 2017-09-20 MED ORDER — IOPAMIDOL (ISOVUE-300) INJECTION 61%
INTRAVENOUS | Status: AC
  Filled 2017-09-20: qty 100

## 2017-09-20 MED ORDER — IOPAMIDOL (ISOVUE-300) INJECTION 61%
100.0000 mL | Freq: Once | INTRAVENOUS | Status: AC | PRN
  Administered 2017-09-20: 100 mL via INTRAVENOUS

## 2017-09-22 ENCOUNTER — Ambulatory Visit (HOSPITAL_BASED_OUTPATIENT_CLINIC_OR_DEPARTMENT_OTHER): Payer: Medicare Other

## 2017-09-22 ENCOUNTER — Ambulatory Visit (HOSPITAL_BASED_OUTPATIENT_CLINIC_OR_DEPARTMENT_OTHER): Payer: Medicare Other | Admitting: Internal Medicine

## 2017-09-22 ENCOUNTER — Encounter: Payer: Self-pay | Admitting: Radiation Oncology

## 2017-09-22 ENCOUNTER — Other Ambulatory Visit: Payer: Self-pay | Admitting: Emergency Medicine

## 2017-09-22 ENCOUNTER — Telehealth: Payer: Self-pay | Admitting: Internal Medicine

## 2017-09-22 ENCOUNTER — Other Ambulatory Visit: Payer: Self-pay | Admitting: *Deleted

## 2017-09-22 ENCOUNTER — Encounter: Payer: Self-pay | Admitting: Internal Medicine

## 2017-09-22 ENCOUNTER — Other Ambulatory Visit (HOSPITAL_BASED_OUTPATIENT_CLINIC_OR_DEPARTMENT_OTHER): Payer: Medicare Other

## 2017-09-22 ENCOUNTER — Ambulatory Visit: Payer: Medicare Other

## 2017-09-22 VITALS — BP 169/84 | HR 79 | Temp 97.9°F | Resp 20 | Ht 70.0 in | Wt 195.3 lb

## 2017-09-22 DIAGNOSIS — C3492 Malignant neoplasm of unspecified part of left bronchus or lung: Secondary | ICD-10-CM

## 2017-09-22 DIAGNOSIS — I313 Pericardial effusion (noninflammatory): Principal | ICD-10-CM

## 2017-09-22 DIAGNOSIS — C3432 Malignant neoplasm of lower lobe, left bronchus or lung: Secondary | ICD-10-CM

## 2017-09-22 DIAGNOSIS — Z95828 Presence of other vascular implants and grafts: Secondary | ICD-10-CM

## 2017-09-22 DIAGNOSIS — Z7901 Long term (current) use of anticoagulants: Secondary | ICD-10-CM

## 2017-09-22 DIAGNOSIS — K121 Other forms of stomatitis: Secondary | ICD-10-CM

## 2017-09-22 DIAGNOSIS — C801 Malignant (primary) neoplasm, unspecified: Secondary | ICD-10-CM

## 2017-09-22 DIAGNOSIS — I318 Other specified diseases of pericardium: Secondary | ICD-10-CM

## 2017-09-22 DIAGNOSIS — I3131 Malignant pericardial effusion in diseases classified elsewhere: Secondary | ICD-10-CM

## 2017-09-22 DIAGNOSIS — Z5111 Encounter for antineoplastic chemotherapy: Secondary | ICD-10-CM

## 2017-09-22 DIAGNOSIS — Z452 Encounter for adjustment and management of vascular access device: Secondary | ICD-10-CM

## 2017-09-22 DIAGNOSIS — I10 Essential (primary) hypertension: Secondary | ICD-10-CM | POA: Diagnosis not present

## 2017-09-22 DIAGNOSIS — I2699 Other pulmonary embolism without acute cor pulmonale: Secondary | ICD-10-CM | POA: Diagnosis not present

## 2017-09-22 LAB — COMPREHENSIVE METABOLIC PANEL
ALT: 55 U/L (ref 0–55)
AST: 30 U/L (ref 5–34)
Albumin: 3.5 g/dL (ref 3.5–5.0)
Alkaline Phosphatase: 78 U/L (ref 40–150)
Anion Gap: 11 mEq/L (ref 3–11)
BILIRUBIN TOTAL: 0.61 mg/dL (ref 0.20–1.20)
BUN: 13.9 mg/dL (ref 7.0–26.0)
CO2: 20 meq/L — AB (ref 22–29)
Calcium: 8.8 mg/dL (ref 8.4–10.4)
Chloride: 109 mEq/L (ref 98–109)
Creatinine: 1.2 mg/dL (ref 0.7–1.3)
GLUCOSE: 128 mg/dL (ref 70–140)
Potassium: 4 mEq/L (ref 3.5–5.1)
SODIUM: 140 meq/L (ref 136–145)
Total Protein: 7.2 g/dL (ref 6.4–8.3)

## 2017-09-22 LAB — UA PROTEIN, DIPSTICK - CHCC: Protein, ur: 100 mg/dL

## 2017-09-22 LAB — CBC WITH DIFFERENTIAL/PLATELET
BASO%: 0.6 % (ref 0.0–2.0)
Basophils Absolute: 0.1 10*3/uL (ref 0.0–0.1)
EOS%: 0 % (ref 0.0–7.0)
Eosinophils Absolute: 0 10*3/uL (ref 0.0–0.5)
HCT: 35.7 % — ABNORMAL LOW (ref 38.4–49.9)
HGB: 11.8 g/dL — ABNORMAL LOW (ref 13.0–17.1)
LYMPH#: 0.5 10*3/uL — AB (ref 0.9–3.3)
LYMPH%: 5.4 % — AB (ref 14.0–49.0)
MCH: 36.4 pg — ABNORMAL HIGH (ref 27.2–33.4)
MCHC: 33 g/dL (ref 32.0–36.0)
MCV: 110.5 fL — AB (ref 79.3–98.0)
MONO#: 0.6 10*3/uL (ref 0.1–0.9)
MONO%: 6.6 % (ref 0.0–14.0)
NEUT%: 87.4 % — ABNORMAL HIGH (ref 39.0–75.0)
NEUTROS ABS: 8.6 10*3/uL — AB (ref 1.5–6.5)
Platelets: 179 10*3/uL (ref 140–400)
RBC: 3.23 10*6/uL — AB (ref 4.20–5.82)
RDW: 21.5 % — ABNORMAL HIGH (ref 11.0–14.6)
WBC: 9.9 10*3/uL (ref 4.0–10.3)

## 2017-09-22 MED ORDER — MAGIC MOUTHWASH: 5.0000 mL | Freq: Four times a day (QID) | ORAL | 1 refills | Status: DC

## 2017-09-22 MED ORDER — SODIUM CHLORIDE 0.9% FLUSH
10.0000 mL | INTRAVENOUS | Status: DC | PRN
  Administered 2017-09-22: 10 mL via INTRAVENOUS
  Filled 2017-09-22: qty 10

## 2017-09-22 NOTE — Progress Notes (Signed)
[facility] [phone]   [phone]  OFFICE PROGRESS NOTE  Rankins, [name], MD [place] [address]  DIAGNOSIS: Stage IV (T1a, N3, M1b) non-small cell lung cancer, adenocarcinoma with PDL 1 expression of 80%. 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  PRIOR THERAPY:  1) Treatment with immunotherapy with [name] (pembrolizumab) 200 MG IV every 3 weeks status post 6 cycles discontinued secondary to disease progression. 2) Systemic chemotherapy with carboplatin for AUC of 5, Alimta 500 MG/M2 and Avastin 15 MG/KG every 3 weeks. First dose 02/24/2017. Status post 6 cycles. Last dose was given 06/09/2017. 3) Maintenance treatment with Alimta 500 MG/M2 and Avastin 15 MG/KG every 3 weeks. First dose 07/21/2017. Status post 3 cycles. His discontinued secondary to disease progression.  CURRENT THERAPY: None  INTERVAL HISTORY: Nicholas Hale 71 y.o. male returns to the clinic today for follow-up visit accompanied by his wife. The patient is feeling fine today except for increasing shortness of breath and cough. He denied having any hemoptysis. He has been tolerating his treatment with Alimta and Avastin fairly well except for increasing fatigue for several days after the treatment. The patient denied having any nausea, vomiting, diarrhea or constipation. He denied having any fever or chills. He has no headache or visual changes. He had repeat CT scan of the chest, abdomen and pelvis performed recently and he is here for evaluation and discussion of his scan results.  MEDICAL HISTORY: Past Medical History:  Diagnosis Date  . Abdominal aortic aneurysm ([facility])   . Arthritis    "hips; lower spine" (05/24/2014)  . Chemotherapy induced neutropenia ([facility]) 03/31/2017    . Chronic fatigue 10/05/2016  . Encounter for antineoplastic chemotherapy 02/17/2017  . Hyperlipidemia   . Hypertension   . Lung cancer ([facility]) 09/25/2016  . PE (pulmonary embolism) 9/15  . Pericardial effusion 09/20/2016   Malignant effusion s/p pericardial drain  . Personal history of DVT (deep vein thrombosis) 9/15    ALLERGIES:  has No Known Allergies.  MEDICATIONS:  Current Outpatient Prescriptions  Medication Sig Dispense Refill  . albuterol (PROVENTIL HFA;VENTOLIN HFA) 108 (90 Base) MCG/ACT inhaler Inhale 2 puffs into the lungs every 4 (four) hours as needed for wheezing or shortness of breath. 1 Inhaler 5  . atorvastatin (LIPITOR) 20 MG tablet Take 1 tablet (20 mg total) by mouth daily at 6 PM. 90 tablet 3  . dexamethasone (DECADRON) 4 MG tablet 4 mg by mouth twice a day the day before, day of and day after the chemotherapy every 3 weeks. 40 tablet 1  . folic acid (FOLVITE) 1 MG tablet Take 1 tablet (1 mg total) by mouth daily. 30 tablet 1  . lidocaine-prilocaine (EMLA) cream Apply 1 application topically as needed (on port site).     . magic mouthwash SOLN Take 5 mLs by mouth 4 (four) times daily. Prednisone 80 mg , Nystatin 30 ml, Benadryl 12.5 mg/5 ml. Mix to 240 ml, 1:1:1 concentration. 240 mL 1  . prochlorperazine (COMPAZINE) 10 MG tablet Take 1 tablet (10 mg total) by mouth every 6 (six) hours as needed for nausea or vomiting. 30 tablet 0  . XARELTO 20 MG TABS tablet Take 20 mg by mouth daily.  5   No current facility-administered medications for this visit.    Facility-Administered Medications Ordered in Other  Visits  Medication Dose Route Frequency Provider Last Rate Last Dose  . sodium chloride flush (NS) 0.9 % injection 10 mL  10 mL Intravenous PRN [name], MD   10 mL at 08/11/17 0940    SURGICAL HISTORY:  Past Surgical History:  Procedure Laterality Date  . ANTERIOR FUSION CERVICAL SPINE  ~ 2005  . APPENDECTOMY  1978  . CARDIAC CATHETERIZATION N/A  09/21/2016   Procedure: Pericardiocentesis;  Surgeon: Jettie Booze, MD;  Location: [facility];  Service: Cardiovascular;  Laterality: N/A;  . COLONOSCOPY    . HEMIARTHROPLASTY SHOULDER FRACTURE Left 12/2008  . INGUINAL HERNIA REPAIR Bilateral 1992  . IR GENERIC HISTORICAL  02/01/2017   IR FLUORO GUIDE PORT INSERTION RIGHT 02/01/2017 Arne Cleveland, MD [facility]  . IR GENERIC HISTORICAL  02/01/2017   IR US GUIDE VASC ACCESS RIGHT 02/01/2017 Arne Cleveland, MD [facility]  . JOINT REPLACEMENT    . PERICARDIAL FLUID DRAINAGE  09/21/2016  . TOTAL HIP ARTHROPLASTY Left 05/23/2014   Procedure: TOTAL HIP ARTHROPLASTY;  Surgeon: Kerin Salen, MD;  Location: [facility];  Service: Orthopedics;  Laterality: Left;  . TOTAL HIP ARTHROPLASTY Right 05/15/2015   Procedure: TOTAL HIP ARTHROPLASTY;  Surgeon: Frederik Pear, MD;  Location: [facility];  Service: Orthopedics;  Laterality: Right;    REVIEW OF SYSTEMS:  Constitutional: positive for fatigue Eyes: negative Ears, nose, mouth, throat, and face: negative Respiratory: positive for cough and dyspnea on exertion Cardiovascular: negative Gastrointestinal: negative Genitourinary:negative Integument/breast: negative Hematologic/lymphatic: negative Musculoskeletal:negative Neurological: negative Behavioral/Psych: negative Endocrine: negative Allergic/Immunologic: negative   PHYSICAL EXAMINATION: General appearance: alert, cooperative, fatigued and no distress Head: Normocephalic, without obvious abnormality, atraumatic Neck: no adenopathy, no JVD, supple, symmetrical, trachea midline and thyroid not enlarged, symmetric, no tenderness/mass/nodules Lymph nodes: Cervical, supraclavicular, and axillary nodes normal. Resp: clear to auscultation bilaterally Back: symmetric, no curvature. ROM normal. No CVA tenderness. Cardio: regular rate and rhythm, S1, S2 normal, no murmur, click, rub or gallop GI: soft, non-tender; bowel sounds normal; no masses,  no  organomegaly Extremities: extremities normal, atraumatic, no cyanosis or edema Neurologic: Alert and oriented X 3, normal strength and tone. Normal symmetric reflexes. Normal coordination and gait  ECOG PERFORMANCE STATUS: 1 - Symptomatic but completely ambulatory  Blood pressure (!) 169/84, pulse 79, temperature 97.9 F (36.6 C), temperature source Oral, resp. rate 20, height 5\' 10"  (1.778 m), weight 195 lb 4.8 oz (88.6 kg), SpO2 100 %.  LABORATORY DATA: Lab Results  Component Value Date   WBC 9.9 09/22/2017   HGB 11.8 (L) 09/22/2017   HCT 35.7 (L) 09/22/2017   MCV 110.5 (H) 09/22/2017   PLT 179 09/22/2017      Chemistry      Component Value Date/Time   NA 140 09/22/2017 0943   K 4.0 09/22/2017 0943   CL 106 08/21/2017 0926   CO2 20 (L) 09/22/2017 0943   BUN 13.9 09/22/2017 0943   CREATININE 1.2 09/22/2017 0943      Component Value Date/Time   CALCIUM 8.8 09/22/2017 0943   ALKPHOS 78 09/22/2017 0943   AST 30 09/22/2017 0943   ALT 55 09/22/2017 0943   BILITOT 0.61 09/22/2017 0943       RADIOGRAPHIC STUDIES: Ct Chest W Contrast  Result Date: 09/20/2017 CLINICAL DATA:  Stage IV non-small cell lung cancer treated with systemic chemotherapy and Keytruda. Undergoing maintenance therapy with Alimta and Avastin. EXAM: CT CHEST, ABDOMEN, AND PELVIS WITH CONTRAST TECHNIQUE: Multidetector CT imaging of the chest, abdomen  and pelvis was performed following the standard protocol during bolus administration of intravenous contrast. CONTRAST:  158mL ISOVUE-300 IOPAMIDOL (ISOVUE-300) INJECTION 61% COMPARISON:  CT 07/06/2017 and 02/15/2017. FINDINGS: CT CHEST FINDINGS Cardiovascular: Mild aortic and coronary artery atherosclerosis. There is increased extrinsic compression of the left lower lobe pulmonary artery by left hilar tumor recurrence. No evidence of acute pulmonary embolism. The heart size is normal. There is no pericardial effusion. Mediastinum/Nodes: There is left hilar tumor  recurrence with a 4.9 x 3.4 cm left hilar mass encasing the left lower lobe pulmonary artery and bronchus. There is a 12 mm left infrahilar node on image 40. AP window nodes have also mildly enlarged, although are within normal limits for size. There is no axillary adenopathy. The thyroid gland, trachea and esophagus demonstrate no significant findings. Lungs/Pleura: There is no pleural effusion. Moderate emphysema and subpleural reticulation are again noted. The spiculated left lower lobe nodule has slightly enlarged, measuring 17 x 11 mm on image 86 (previously 11 x 8 mm). There is also enlargement of a well-circumscribed perifissural nodule in the left perihilar region, measuring up to 8 mm on image 79. Musculoskeletal/Chest wall: No chest wall mass or suspicious osseous findings. CT ABDOMEN AND PELVIS FINDINGS Hepatobiliary: Stable subcapsular lesion in the left hepatic lobe (image 57), likely a cyst. No new or enlarging hepatic lesions. Possible minute gallstone. No gallbladder wall thickening or biliary dilatation. Pancreas: Unremarkable. No pancreatic ductal dilatation or surrounding inflammatory changes. Spleen: Normal in size without focal abnormality. Adrenals/Urinary Tract: Both adrenal glands appear normal. A large septated right renal cyst is unchanged. This has thin septal septations, but no enhancing solid components. There are additional small low-density renal lesions bilaterally which are stable. No evidence of hydronephrosis or urinary tract calculus. The bladder appears unremarkable. Stomach/Bowel: No evidence of bowel wall thickening, distention or surrounding inflammatory change. Stable distal colonic diverticulosis. There is prominent stool in the rectum. Vascular/Lymphatic: There is a stable aortocaval node measuring 10 mm on image 75. No new or enlarging abdominopelvic lymph nodes. There is aortic and branch vessel atherosclerosis with a stable aneurysm of the infrarenal aorta, measuring up  to 3.6 cm AP. Reproductive: The prostate gland and seminal vesicles appear unchanged, partly obscured by a beam hardening artifact from the hip arthroplasties. Other: No evidence of abdominal wall mass or hernia. No ascites. Musculoskeletal: No acute or significant osseous findings. Bilateral total hip arthroplasty with associated beam hardening artifact. IMPRESSION: 1. Tumor recurrence at the left hilum with encasement and narrowing of the left lower lobe bronchus and pulmonary artery. No evidence of lobar collapse. 2. Interval enlargement of dominant left lower lobe pulmonary nodule consistent with progressive tumor. There is also increasing perifissural therapy along the left major fissure, likely nodal metastasis. 3. No evidence of metastatic disease within the abdomen or pelvis. 4. Stable incidental findings including emphysema, aortic atherosclerosis and abdominal aortic aneurysm, hepatic and renal cysts. Electronically Signed   By: Richardean Sale M.D.   On: 09/20/2017 13:25   Ct Abdomen Pelvis W Contrast  Result Date: 09/20/2017 CLINICAL DATA:  Stage IV non-small cell lung cancer treated with systemic chemotherapy and Keytruda. Undergoing maintenance therapy with Alimta and Avastin. EXAM: CT CHEST, ABDOMEN, AND PELVIS WITH CONTRAST TECHNIQUE: Multidetector CT imaging of the chest, abdomen and pelvis was performed following the standard protocol during bolus administration of intravenous contrast. CONTRAST:  118mL ISOVUE-300 IOPAMIDOL (ISOVUE-300) INJECTION 61% COMPARISON:  CT 07/06/2017 and 02/15/2017. FINDINGS: CT CHEST FINDINGS Cardiovascular: Mild aortic and coronary artery  atherosclerosis. There is increased extrinsic compression of the left lower lobe pulmonary artery by left hilar tumor recurrence. No evidence of acute pulmonary embolism. The heart size is normal. There is no pericardial effusion. Mediastinum/Nodes: There is left hilar tumor recurrence with a 4.9 x 3.4 cm left hilar mass encasing  the left lower lobe pulmonary artery and bronchus. There is a 12 mm left infrahilar node on image 40. AP window nodes have also mildly enlarged, although are within normal limits for size. There is no axillary adenopathy. The thyroid gland, trachea and esophagus demonstrate no significant findings. Lungs/Pleura: There is no pleural effusion. Moderate emphysema and subpleural reticulation are again noted. The spiculated left lower lobe nodule has slightly enlarged, measuring 17 x 11 mm on image 86 (previously 11 x 8 mm). There is also enlargement of a well-circumscribed perifissural nodule in the left perihilar region, measuring up to 8 mm on image 79. Musculoskeletal/Chest wall: No chest wall mass or suspicious osseous findings. CT ABDOMEN AND PELVIS FINDINGS Hepatobiliary: Stable subcapsular lesion in the left hepatic lobe (image 57), likely a cyst. No new or enlarging hepatic lesions. Possible minute gallstone. No gallbladder wall thickening or biliary dilatation. Pancreas: Unremarkable. No pancreatic ductal dilatation or surrounding inflammatory changes. Spleen: Normal in size without focal abnormality. Adrenals/Urinary Tract: Both adrenal glands appear normal. A large septated right renal cyst is unchanged. This has thin septal septations, but no enhancing solid components. There are additional small low-density renal lesions bilaterally which are stable. No evidence of hydronephrosis or urinary tract calculus. The bladder appears unremarkable. Stomach/Bowel: No evidence of bowel wall thickening, distention or surrounding inflammatory change. Stable distal colonic diverticulosis. There is prominent stool in the rectum. Vascular/Lymphatic: There is a stable aortocaval node measuring 10 mm on image 75. No new or enlarging abdominopelvic lymph nodes. 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 Reproductive: The prostate gland and  seminal vesicles appear unchanged, partly obscured by a beam hardening artifact from the hip arthroplasties. Other: No evidence of abdominal wall mass or hernia. No ascites. Musculoskeletal: No acute or significant osseous findings. Bilateral total hip arthroplasty with associated beam hardening artifact. IMPRESSION: 1. Tumor recurrence at the left hilum with encasement and narrowing of the left lower lobe bronchus and pulmonary artery. No evidence of lobar collapse. 2. Interval enlargement of dominant left lower lobe pulmonary nodule consistent with progressive tumor. There is also increasing perifissural therapy along the left major fissure, likely nodal metastasis. 3. No evidence of metastatic disease within the abdomen or pelvis. 4. Stable incidental findings including emphysema, aortic atherosclerosis and abdominal aortic aneurysm, hepatic and renal cysts. Electronically Signed   By: Richardean Sale M.D.   On: 09/20/2017 13:25    ASSESSMENT AND PLAN:  This is a very pleasant 71 years old white male with stage IV non-small cell lung cancer, adenocarcinoma with positive PDL 1 expression of 80% status post 6 cycles of treatment with [place] discontinued secondary to disease progression.  The patient underwent systemic chemotherapy with carboplatin, Alimta and Avastin status post 6 cycles.  He patient was started on maintenance treatment with Alimta and Avastin status post 3 cycles. He tolerated the treatment well except for fatigue. He had repeat CT scan of the chest, abdomen and pelvis performed recently. I personally and independently reviewed the scan images and discuss the results and showed the images to the patient and his wife. Unfortunately the scan showed evidence for  disease progression with increase in the dominant left lower lobe pulmonary nodule as well as increasing nodal metastasis. There was also evidence of disease recurrence in the left hilum with encasement and narrowing of the left  lower lobe bronchus and pulmonary artery. I had a lengthy discussion with the patient today about this finding and treatment options. I recommended for him to discontinue his current treatment with Alimta and Avastin. I recommended for the patient to see radiation oncology for consideration of palliative radiotherapy to the progressive left hilar soft tissue mass to avoid any future collapse of his lung. After the palliative radiotherapy, I would see the patient back for follow-up visit in a few weeks for discussion of systemic treatment options or palliative care. The patient and his wife agreed to the current plan. 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 For the uncontrolled hypertension, the patient has a lot of anxiety today about his scan results and I strongly recommended for him to monitor his blood pressure closely at home and to report to his primary care physician if no improvement in his condition. The patient was advised to call immediately if he has any concerning symptoms in the interval. The patient voices understanding of current disease status and treatment options and is in agreement with the current care plan. All questions were answered. The patient knows to call the clinic with any problems, questions or concerns. We can certainly see the patient much sooner if necessary. I spent 25 minutes counseling the patient face to face. The total time spent in the appointment was 40 minutes.  Disclaimer: This note was dictated with voice recognition software. Similar sounding words can inadvertently be transcribed and may not be corrected upon review.

## 2017-09-22 NOTE — Telephone Encounter (Signed)
Gave avs and calendar for December  °

## 2017-09-23 ENCOUNTER — Ambulatory Visit (HOSPITAL_BASED_OUTPATIENT_CLINIC_OR_DEPARTMENT_OTHER): Payer: Medicare Other

## 2017-09-23 DIAGNOSIS — C3492 Malignant neoplasm of unspecified part of left bronchus or lung: Secondary | ICD-10-CM

## 2017-09-23 DIAGNOSIS — Z452 Encounter for adjustment and management of vascular access device: Secondary | ICD-10-CM | POA: Diagnosis not present

## 2017-09-23 DIAGNOSIS — Z95828 Presence of other vascular implants and grafts: Secondary | ICD-10-CM

## 2017-09-23 DIAGNOSIS — C3432 Malignant neoplasm of lower lobe, left bronchus or lung: Secondary | ICD-10-CM

## 2017-09-23 MED ORDER — HEPARIN SOD (PORK) LOCK FLUSH 100 UNIT/ML IV SOLN
500.0000 [IU] | Freq: Once | INTRAVENOUS | Status: AC | PRN
  Administered 2017-09-23: 500 [IU] via INTRAVENOUS
  Filled 2017-09-23: qty 5

## 2017-09-23 MED ORDER — SODIUM CHLORIDE 0.9% FLUSH
10.0000 mL | INTRAVENOUS | Status: DC | PRN
  Administered 2017-09-23: 10 mL via INTRAVENOUS
  Filled 2017-09-23: qty 10

## 2017-09-23 NOTE — Patient Instructions (Signed)
Implanted Port Home Guide An implanted port is a type of central line that is placed under the skin. Central lines are used to provide IV access when treatment or nutrition needs to be given through a person's veins. Implanted ports are used for long-term IV access. An implanted port may be placed because:  You need IV medicine that would be irritating to the small veins in your hands or arms.  You need long-term IV medicines, such as antibiotics.  You need IV nutrition for a long period.  You need frequent blood draws for lab tests.  You need dialysis.  Implanted ports are usually placed in the chest area, but they can also be placed in the upper arm, the abdomen, or the leg. An implanted port has two main parts:  Reservoir. The reservoir is round and will appear as a small, raised area under your skin. The reservoir is the part where a needle is inserted to give medicines or draw blood.  Catheter. The catheter is a thin, flexible tube that extends from the reservoir. The catheter is placed into a large vein. Medicine that is inserted into the reservoir goes into the catheter and then into the vein.  How will I care for my incision site? Do not get the incision site wet. Bathe or shower as directed by your health care provider. How is my port accessed? Special steps must be taken to access the port:  Before the port is accessed, a numbing cream can be placed on the skin. This helps numb the skin over the port site.  Your health care provider uses a sterile technique to access the port. ? Your health care provider must put on a mask and sterile gloves. ? The skin over your port is cleaned carefully with an antiseptic and allowed to dry. ? The port is gently pinched between sterile gloves, and a needle is inserted into the port.  Only "non-coring" port needles should be used to access the port. Once the port is accessed, a blood return should be checked. This helps ensure that the port  is in the vein and is not clogged.  If your port needs to remain accessed for a constant infusion, a clear (transparent) bandage will be placed over the needle site. The bandage and needle will need to be changed every week, or as directed by your health care provider.  Keep the bandage covering the needle clean and dry. Do not get it wet. Follow your health care provider's instructions on how to take a shower or bath while the port is accessed.  If your port does not need to stay accessed, no bandage is needed over the port.  What is flushing? Flushing helps keep the port from getting clogged. Follow your health care provider's instructions on how and when to flush the port. Ports are usually flushed with saline solution or a medicine called heparin. The need for flushing will depend on how the port is used.  If the port is used for intermittent medicines or blood draws, the port will need to be flushed: ? After medicines have been given. ? After blood has been drawn. ? As part of routine maintenance.  If a constant infusion is running, the port may not need to be flushed.  How long will my port stay implanted? The port can stay in for as long as your health care provider thinks it is needed. When it is time for the port to come out, surgery will be   done to remove it. The procedure is similar to the one performed when the port was put in. When should I seek immediate medical care? When you have an implanted port, you should seek immediate medical care if:  You notice a bad smell coming from the incision site.  You have swelling, redness, or drainage at the incision site.  You have more swelling or pain at the port site or the surrounding area.  You have a fever that is not controlled with medicine.  This information is not intended to replace advice given to you by your health care provider. Make sure you discuss any questions you have with your health care provider. Document  Released: 11/16/2005 Document Revised: 04/23/2016 Document Reviewed: 07/24/2013 Elsevier Interactive Patient Education  2017 [employer].  

## 2017-09-23 NOTE — Progress Notes (Signed)
Thoracic Location of Tumor / Histology: Stage IV (T1a, N3, M1b) non-small cell lung cancer, adenocarcinoma with PDL 1 expression of 80%. This presented with left lower lobe lung nodule in addition to bilateral mediastinal and supraclavicular lymphadenopathy as well as retroperitoneal lymphadenopathy   Patient presented in October 2017 with complaints of shortness of breath. A CT scan was performed and his lung cancer was found.   Biopsies revealed:   09/21/16 PERICARDIAL FLUID (SPECIMEN 1 OF 1 COLLECTED 09-21-2016) METASTATIC ADENOCARCINOMA, CONSISTENT WITH LUNG PRIMARY.  Tobacco/Marijuana/Snuff/ETOH use: former smoker, quit in 2010, smoked 1/2 ppd for 40 years. Denies ETOH, marijuana use.  Past/Anticipated interventions by cardiothoracic surgery, if any: None  Past/Anticipated interventions by medical oncology, if any: Treatment with immunotherapy with [name] (pembrolizumab) 200 MG IV every 3 weeks status post 6 cycles discontinued secondary to disease progression. 2) Systemic chemotherapy with carboplatin for AUC of 5, Alimta 500 MG/M2 and Avastin 15 MG/KG every 3 weeks. First dose 02/24/2017. Status post 6 cycles. Last dose was given 06/09/2017. 3) Maintenance treatment with Alimta 500 MG/M2 and Avastin 15 MG/KG every 3 weeks. First dose 07/21/2017. Status post 3 cycles. His discontinued secondary to disease progression.  Signs/Symptoms  Weight changes, if any: He denies.   Respiratory complaints, if any: He reports being unable to take a deep enough breath to cough up sputum. When he is able to cough up sputum it is clear. He is taking mucinex for this with very little relief. He has a wheeze when he takes a deep breath.   Hemoptysis, if any: No  Pain issues, if any: No   SAFETY ISSUES:  Prior radiation? No  Pacemaker/ICD? No  Possible current pregnancy? no  Is the patient on methotrexate? No  Current Complaints / other details:  Dr. [name] is recommending palliative  radiotherapy to the progressive left hilar soft tissue mass to avoid any future collapse of his lung.  BP (!) 154/68   Pulse 85   Temp 99.3 F (37.4 C)   Ht 5\' 10"  (1.778 m)   Wt 200 lb 3.2 oz (90.8 kg)   SpO2 100% Comment: room air  BMI 28.73 kg/m    Wt Readings from Last 3 Encounters:  09/27/17 200 lb 3.2 oz (90.8 kg)  09/22/17 195 lb 4.8 oz (88.6 kg)  09/01/17 197 lb (89.4 kg)

## 2017-09-27 ENCOUNTER — Encounter: Payer: Self-pay | Admitting: Radiation Oncology

## 2017-09-27 ENCOUNTER — Ambulatory Visit
Admission: RE | Admit: 2017-09-27 | Discharge: 2017-09-27 | Disposition: A | Discharge status: Home / Self Care | Payer: Medicare Other | Source: Ambulatory Visit | Attending: Radiation Oncology | Admitting: Radiation Oncology

## 2017-09-27 VITALS — BP 154/68 | HR 85 | Temp 99.3°F | Ht 70.0 in | Wt 200.2 lb

## 2017-09-27 DIAGNOSIS — Z7901 Long term (current) use of anticoagulants: Secondary | ICD-10-CM | POA: Insufficient documentation

## 2017-09-27 DIAGNOSIS — C3492 Malignant neoplasm of unspecified part of left bronchus or lung: Secondary | ICD-10-CM

## 2017-09-27 DIAGNOSIS — Z87891 Personal history of nicotine dependence: Secondary | ICD-10-CM | POA: Diagnosis not present

## 2017-09-27 DIAGNOSIS — I10 Essential (primary) hypertension: Secondary | ICD-10-CM | POA: Diagnosis not present

## 2017-09-27 DIAGNOSIS — Z981 Arthrodesis status: Secondary | ICD-10-CM | POA: Diagnosis not present

## 2017-09-27 DIAGNOSIS — I714 Abdominal aortic aneurysm, without rupture: Secondary | ICD-10-CM | POA: Insufficient documentation

## 2017-09-27 DIAGNOSIS — Z96643 Presence of artificial hip joint, bilateral: Secondary | ICD-10-CM | POA: Diagnosis not present

## 2017-09-27 DIAGNOSIS — M1990 Unspecified osteoarthritis, unspecified site: Secondary | ICD-10-CM | POA: Insufficient documentation

## 2017-09-27 DIAGNOSIS — C3432 Malignant neoplasm of lower lobe, left bronchus or lung: Secondary | ICD-10-CM | POA: Insufficient documentation

## 2017-09-27 DIAGNOSIS — C3490 Malignant neoplasm of unspecified part of unspecified bronchus or lung: Secondary | ICD-10-CM

## 2017-09-27 DIAGNOSIS — Z79899 Other long term (current) drug therapy: Secondary | ICD-10-CM | POA: Insufficient documentation

## 2017-09-27 DIAGNOSIS — R5382 Chronic fatigue, unspecified: Secondary | ICD-10-CM | POA: Insufficient documentation

## 2017-09-27 DIAGNOSIS — Z510 Encounter for antineoplastic radiation therapy: Secondary | ICD-10-CM | POA: Insufficient documentation

## 2017-09-27 DIAGNOSIS — Z832 Family history of diseases of the blood and blood-forming organs and certain disorders involving the immune mechanism: Secondary | ICD-10-CM | POA: Insufficient documentation

## 2017-09-27 DIAGNOSIS — Z833 Family history of diabetes mellitus: Secondary | ICD-10-CM | POA: Diagnosis not present

## 2017-09-27 DIAGNOSIS — Z9221 Personal history of antineoplastic chemotherapy: Secondary | ICD-10-CM | POA: Insufficient documentation

## 2017-09-27 DIAGNOSIS — Z86718 Personal history of other venous thrombosis and embolism: Secondary | ICD-10-CM | POA: Insufficient documentation

## 2017-09-27 DIAGNOSIS — Z86711 Personal history of pulmonary embolism: Secondary | ICD-10-CM | POA: Insufficient documentation

## 2017-09-27 DIAGNOSIS — E785 Hyperlipidemia, unspecified: Secondary | ICD-10-CM | POA: Diagnosis not present

## 2017-09-27 DIAGNOSIS — Z8042 Family history of malignant neoplasm of prostate: Secondary | ICD-10-CM | POA: Diagnosis not present

## 2017-09-27 DIAGNOSIS — Z9289 Personal history of other medical treatment: Secondary | ICD-10-CM | POA: Diagnosis not present

## 2017-09-27 MED ORDER — HYDROCOD POLST-CPM POLST ER 10-8 MG/5ML PO SUER: 5.0000 mL | Freq: Every evening | ORAL | 0 refills | Status: DC | PRN

## 2017-09-27 NOTE — Progress Notes (Signed)
Radiation Oncology         [phone] ________________________________  Initial Outpatient Consultation  Name: Nicholas [facility] MRN: [id number]  Date: 09/27/2017  DOB: 07/01/1946  FH:[id number], Nicholas Salinas, MD  Nicholas Bears, MD   REFERRING PHYSICIAN: [name], MD  DIAGNOSIS: Stage IV (T1a, N3, M1b) non-small cell lung cancer, adenocarcinoma with PDL 1 expression of 80%,  Now with local progression  HISTORY OF PRESENT ILLNESS::Nicholas [facility] is a 71 y.o. male who is presenting today for evaluation and treatment of left lower lobe lung cancer. Patient was initially diagnosed with metastatic adenocarcinoma of the left lung through cytology of the pericardial fluid while admitted to the hospital for SOB in October of 2017.   On 10/05/2016, a PET scan was done from skull base to thigh revealing extensive thoracic and cervical nodal hypermetabolism consistent with metastatic disease. There was an isolated left lower lobe hypermetabolic pulmonary nodules that may have represented the primary nodule. This hypermetabolic node was relatively low-level compared to the metastatic nodes. An isolated abdominal retroperitoneal low-level hypermetabolic node could represent atypical distribution of lung cancer metastasis or be reactive.   His most recent CT chest, abdomen/pelvis on 09/20/2017 revealed tumor recurrence at the left hilum with encasement and narrowing of the left lower lobe bronchus and pulmonary artery. There is interval enlargement of the dominant left lower lobe pulmonary nodule consistent with a progressive tumor. There is also increasing peri fissural therapy along the left major fissure that is likely nodal metastasis. There is no evidence of metastatic disease within the abdomen or pelvis.  He is here for a consultation visit to set up palliative radiation therapy. He presents here with his wife. He states he has been treated with immunotherapy with Keytruda 200 mg IV every three  weeks for 6 cycles but was discontinued due to disease progression. He was also treated with carboplatin  Alimta 500 mg/m2 and Avastin 15 mg/kg every 3 weeks for 6 cycles with the last dose on 06/09/2017. His maintenance treatment with Alimta 500 mg/m2 and Avastin 15 mg/kg every 3 weeks for 3 cycles started on 07/21/2017, but was discontinued due to disease progression. He is a former smoker of 0.5 PPD for 40 years. He quit in 2010, 8 years ago. He denies ETOH or marijuana use.  On review of systems, he reports a productive cough of clear phlegm that sometime keeps him awake at night. He reports some associated chest soreness due to the cough. He has been taking Mucinex for the cough with minimal relief. He reports some wheezing with deep inspiration. He denies hemoptysis, HA, visual problems, current lymphadenopathy, pain or weight changes. He denies any previous chest surgeries.   PREVIOUS RADIATION THERAPY: No  PAST MEDICAL HISTORY:  has a past medical history of Abdominal aortic aneurysm ([place]); Arthritis; Chemotherapy induced neutropenia ([place]) (03/31/2017); Chronic fatigue (10/05/2016); Encounter for antineoplastic chemotherapy (02/17/2017); Hyperlipidemia; Hypertension; Lung cancer ([place]) (09/25/2016); PE (pulmonary embolism) (9/15); Pericardial effusion (09/20/2016); and Personal history of DVT (deep vein thrombosis) (9/15).    PAST SURGICAL HISTORY: Past Surgical History:  Procedure Laterality Date  . ANTERIOR FUSION CERVICAL SPINE  ~ 2005  . APPENDECTOMY  1978  . CARDIAC CATHETERIZATION N/A 09/21/2016   Procedure: Pericardiocentesis;  Surgeon: Jettie Booze, MD;  Location: [facility];  Service: Cardiovascular;  Laterality: N/A;  . COLONOSCOPY    . HEMIARTHROPLASTY SHOULDER FRACTURE Left 12/2008  . INGUINAL HERNIA REPAIR Bilateral 1992  . IR GENERIC HISTORICAL  02/01/2017  IR FLUORO GUIDE PORT INSERTION RIGHT 02/01/2017 Nicholas Cleveland, MD [facility]  . IR GENERIC HISTORICAL  02/01/2017    IR US GUIDE VASC ACCESS RIGHT 02/01/2017 Nicholas Cleveland, MD [facility]  . JOINT REPLACEMENT    . PERICARDIAL FLUID DRAINAGE  09/21/2016  . TOTAL HIP ARTHROPLASTY Left 05/23/2014   Procedure: TOTAL HIP ARTHROPLASTY;  Surgeon: Kerin Salen, MD;  Location: [facility];  Service: Orthopedics;  Laterality: Left;  . TOTAL HIP ARTHROPLASTY Right 05/15/2015   Procedure: TOTAL HIP ARTHROPLASTY;  Surgeon: Frederik Pear, MD;  Location: [facility];  Service: Orthopedics;  Laterality: Right;    FAMILY HISTORY: family history includes Clotting disorder in his mother; Diabetes Mellitus I in his mother; Prostate cancer in his father.  SOCIAL HISTORY:  reports that he quit smoking about 8 years ago. His smoking use included Cigarettes. He has a 20.00 pack-year smoking history. He has never used smokeless tobacco. He reports that he does not drink alcohol or use drugs.  ALLERGIES: Patient has no known allergies.  MEDICATIONS:  Current Outpatient Prescriptions  Medication Sig Dispense Refill  . albuterol (PROVENTIL HFA;VENTOLIN HFA) 108 (90 Base) MCG/ACT inhaler Inhale 2 puffs into the lungs every 4 (four) hours as needed for wheezing or shortness of breath. 1 Inhaler 5  . atorvastatin (LIPITOR) 20 MG tablet Take 1 tablet (20 mg total) by mouth daily at 6 PM. 90 tablet 3  . dextromethorphan-guaiFENesin (MUCINEX DM) 30-600 MG 12hr tablet Take 2 tablets by mouth 2 (two) times daily.    Marland Kitchen lidocaine-prilocaine (EMLA) cream Apply 1 application topically as needed (on port site).     . magic mouthwash SOLN Take 5 mLs by mouth 4 (four) times daily. Prednisone 80 mg , Nystatin 30 ml, Benadryl 12.5 mg/5 ml. Mix to 240 ml, 1:1:1 concentration. 240 mL 1  . prochlorperazine (COMPAZINE) 10 MG tablet Take 1 tablet (10 mg total) by mouth every 6 (six) hours as needed for nausea or vomiting. 30 tablet 0  . XARELTO 20 MG TABS tablet Take 20 mg by mouth daily.  5  . dexamethasone (DECADRON) 4 MG tablet 4 mg by mouth twice a day the day  before, day of and day after the chemotherapy every 3 weeks. (Patient not taking: Reported on 09/27/2017) 40 tablet 1   No current facility-administered medications for this encounter.    Facility-Administered Medications Ordered in Other Encounters  Medication Dose Route Frequency Provider Last Rate Last Dose  . sodium chloride flush (NS) 0.9 % injection 10 mL  10 mL Intravenous PRN Nicholas Bears, MD   10 mL at 08/11/17 0940    REVIEW OF SYSTEMS:  A 10+ POINT REVIEW OF SYSTEMS WAS OBTAINED including neurology, dermatology, psychiatry, cardiac, respiratory, lymph, extremities, GI, GU, Musculoskeletal, constitutional, breasts, reproductive, HEENT.  All pertinent positives are noted in the HPI.  All others are negative.   PHYSICAL EXAM:  height is 5\' 10"  (1.778 m) and weight is 200 lb 3.2 oz (90.8 kg). His temperature is 99.3 F (37.4 C). His blood pressure is 154/68 (abnormal) and his pulse is 85. His oxygen saturation is 100%.    General: Alert and oriented, in no acute distress HEENT: Head is normocephalic. Extraocular movements are intact. Oropharynx is clear. Neck: Neck is supple, no palpable cervical or supraclavicular lymphadenopathy. Heart: Regular in rate and rhythm with no murmurs, rubs, or gallops. Chest: Clear to auscultation bilaterally, with no rhonchi, wheezes, or rales. Abdomen: Soft, nontender, nondistended, with no rigidity or guarding. Extremities: No  cyanosis or edema. Lymphatics: see Neck Exam Skin: No concerning lesions. Musculoskeletal: symmetric strength and muscle tone throughout. Neurologic: Cranial nerves II through XII are grossly intact. No obvious focalities. Speech is fluent. Coordination is intact. Psychiatric: Judgment and insight are intact. Affect is appropriate.   ECOG = 1  LABORATORY DATA:  Lab Results  Component Value Date   WBC 9.9 09/22/2017   HGB 11.8 (L) 09/22/2017   HCT 35.7 (L) 09/22/2017   MCV 110.5 (H) 09/22/2017   PLT 179 09/22/2017    NEUTROABS 8.6 (H) 09/22/2017   Lab Results  Component Value Date   NA 140 09/22/2017   K 4.0 09/22/2017   CL 106 08/21/2017   CO2 20 (L) 09/22/2017   GLUCOSE 128 09/22/2017   CREATININE 1.2 09/22/2017   CALCIUM 8.8 09/22/2017      RADIOGRAPHY: Ct Chest W Contrast  Result Date: 09/20/2017 CLINICAL DATA:  Stage IV non-small cell lung cancer treated with systemic chemotherapy and Keytruda. Undergoing maintenance therapy with Alimta and Avastin. EXAM: CT CHEST, ABDOMEN, AND PELVIS WITH CONTRAST TECHNIQUE: Multidetector CT imaging of the chest, abdomen and pelvis was performed following the standard protocol during bolus administration of intravenous contrast. CONTRAST:  163mL ISOVUE-300 IOPAMIDOL (ISOVUE-300) INJECTION 61% COMPARISON:  CT 07/06/2017 and 02/15/2017. FINDINGS: CT CHEST FINDINGS Cardiovascular: Mild aortic and coronary artery atherosclerosis. There is increased extrinsic compression of the left lower lobe pulmonary artery by left hilar tumor recurrence. No evidence of acute pulmonary embolism. The heart size is normal. There is no pericardial effusion. Mediastinum/Nodes: There is left hilar tumor recurrence with a 4.9 x 3.4 cm left hilar mass encasing the left lower lobe pulmonary artery and bronchus. There is a 12 mm left infrahilar node on image 40. AP window nodes have also mildly enlarged, although are within normal limits for size. There is no axillary adenopathy. The thyroid gland, trachea and esophagus demonstrate no significant findings. Lungs/Pleura: There is no pleural effusion. Moderate emphysema and subpleural reticulation are again noted. The spiculated left lower lobe nodule has slightly enlarged, measuring 17 x 11 mm on image 86 (previously 11 x 8 mm). There is also enlargement of a well-circumscribed perifissural nodule in the left perihilar region, measuring up to 8 mm on image 79. Musculoskeletal/Chest wall: No chest wall mass or suspicious osseous findings. CT ABDOMEN  AND PELVIS FINDINGS Hepatobiliary: Stable subcapsular lesion in the left hepatic lobe (image 57), likely a cyst. No new or enlarging hepatic lesions. Possible minute gallstone. No gallbladder wall thickening or biliary dilatation. Pancreas: Unremarkable. No pancreatic ductal dilatation or surrounding inflammatory changes. Spleen: Normal in size without focal abnormality. Adrenals/Urinary Tract: Both adrenal glands appear normal. A large septated right renal cyst is unchanged. This has thin septal septations, but no enhancing solid components. There are additional small low-density renal lesions bilaterally which are stable. No evidence of hydronephrosis or urinary tract calculus. The bladder appears unremarkable. Stomach/Bowel: No evidence of bowel wall thickening, distention or surrounding inflammatory change. Stable distal colonic diverticulosis. There is prominent stool in the rectum. Vascular/Lymphatic: There is a stable aortocaval node measuring 10 mm on image 75. No new or enlarging abdominopelvic lymph nodes. 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 Reproductive: The prostate gland and seminal vesicles appear unchanged, partly obscured by a beam hardening artifact from the hip arthroplasties. Other: No evidence of abdominal wall mass or hernia. No ascites. Musculoskeletal: No acute or significant osseous findings. Bilateral  total hip arthroplasty with associated beam hardening artifact. IMPRESSION: 1. Tumor recurrence at the left hilum with encasement and narrowing of the left lower lobe bronchus and pulmonary artery. No evidence of lobar collapse. 2. Interval enlargement of dominant left lower lobe pulmonary nodule consistent with progressive tumor. There is also increasing perifissural therapy along the left major fissure, likely nodal metastasis. 3. No evidence of metastatic disease within the abdomen or pelvis. 4. Stable  incidental findings including emphysema, aortic atherosclerosis and abdominal aortic aneurysm, hepatic and renal cysts. Electronically Signed   By: Richardean Sale M.D.   On: 09/20/2017 13:25   Ct Abdomen Pelvis W Contrast  Result Date: 09/20/2017 CLINICAL DATA:  Stage IV non-small cell lung cancer treated with systemic chemotherapy and Keytruda. Undergoing maintenance therapy with Alimta and Avastin. EXAM: CT CHEST, ABDOMEN, AND PELVIS WITH CONTRAST TECHNIQUE: Multidetector CT imaging of the chest, abdomen and pelvis was performed following the standard protocol during bolus administration of intravenous contrast. CONTRAST:  175mL ISOVUE-300 IOPAMIDOL (ISOVUE-300) INJECTION 61% COMPARISON:  CT 07/06/2017 and 02/15/2017. FINDINGS: CT CHEST FINDINGS Cardiovascular: Mild aortic and coronary artery atherosclerosis. There is increased extrinsic compression of the left lower lobe pulmonary artery by left hilar tumor recurrence. No evidence of acute pulmonary embolism. The heart size is normal. There is no pericardial effusion. Mediastinum/Nodes: There is left hilar tumor recurrence with a 4.9 x 3.4 cm left hilar mass encasing the left lower lobe pulmonary artery and bronchus. There is a 12 mm left infrahilar node on image 40. AP window nodes have also mildly enlarged, although are within normal limits for size. There is no axillary adenopathy. The thyroid gland, trachea and esophagus demonstrate no significant findings. Lungs/Pleura: There is no pleural effusion. Moderate emphysema and subpleural reticulation are again noted. The spiculated left lower lobe nodule has slightly enlarged, measuring 17 x 11 mm on image 86 (previously 11 x 8 mm). There is also enlargement of a well-circumscribed perifissural nodule in the left perihilar region, measuring up to 8 mm on image 79. Musculoskeletal/Chest wall: No chest wall mass or suspicious osseous findings. CT ABDOMEN AND PELVIS FINDINGS Hepatobiliary: Stable subcapsular  lesion in the left hepatic lobe (image 57), likely a cyst. No new or enlarging hepatic lesions. Possible minute gallstone. No gallbladder wall thickening or biliary dilatation. Pancreas: Unremarkable. No pancreatic ductal dilatation or surrounding inflammatory changes. Spleen: Normal in size without focal abnormality. Adrenals/Urinary Tract: Both adrenal glands appear normal. A large septated right renal cyst is unchanged. This has thin septal septations, but no enhancing solid components. There are additional small low-density renal lesions bilaterally which are stable. No evidence of hydronephrosis or urinary tract calculus. The bladder appears unremarkable. Stomach/Bowel: No evidence of bowel wall thickening, distention or surrounding inflammatory change. Stable distal colonic diverticulosis. There is prominent stool in the rectum. Vascular/Lymphatic: There is a stable aortocaval node measuring 10 mm on image 75. No new or enlarging abdominopelvic lymph nodes. 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 Reproductive: The prostate gland and seminal vesicles appear unchanged, partly obscured by a beam hardening artifact from the hip arthroplasties. Other: No evidence of abdominal wall mass or hernia. No ascites. Musculoskeletal: No acute or significant osseous findings. Bilateral total hip arthroplasty with associated beam hardening artifact. IMPRESSION: 1. Tumor recurrence at the left hilum with encasement and narrowing of the left lower lobe bronchus and pulmonary artery. No evidence of lobar collapse. 2. Interval enlargement of  dominant left lower lobe pulmonary nodule consistent with progressive tumor. There is also increasing perifissural therapy along the left major fissure, likely nodal metastasis. 3. No evidence of metastatic disease within the abdomen or pelvis. 4. Stable incidental findings including emphysema, aortic  atherosclerosis and abdominal aortic aneurysm, hepatic and renal cysts. Electronically Signed   By: Richardean Sale M.D.   On: 09/20/2017 13:25      IMPRESSION: Stage IV (T1a, N3, M1b) non-small cell lung cancer, adenocarcinoma with PDL 1 expression of 80%. Patient now has local progression in the left hilum area which is causing encasement and narrowing of the left lower lobe bronchus and pulmonary artery. With progression in this area the patient may develop left lower lung collapse and worsening breathing. I would recommend a course of palliative radiation therapy for this issue  MACLEAN FOISTER is a wonderful 71 y.o. male who presents today to discuss the role of radiotherapy in the ongoing management of their left lung cancer. We discussed the general course of radiation, potential side effects, and toxicities with radiation and the patient is interested in this approach. A consent form was signed and placed in his chart today.  PLAN: His CT simulation appointment is 09/28/2017 at 1 PM. I anticipate 3 weeks of radiation therapy.      ------------------------------------------------  Blair Promise, PhD, MD  This document serves as a record of services personally performed by Gery Pray, MD. It was created on his behalf by Marlowe Kays, a trained medical scribe. The creation of this record is based on the scribe's personal observations and the provider's statements to them. This document has been checked and approved by the attending provider.

## 2017-09-28 ENCOUNTER — Ambulatory Visit
Admission: RE | Admit: 2017-09-28 | Discharge: 2017-09-28 | Disposition: A | Discharge status: Home / Self Care | Payer: Medicare Other | Source: Ambulatory Visit | Attending: Radiation Oncology | Admitting: Radiation Oncology

## 2017-09-28 DIAGNOSIS — Z510 Encounter for antineoplastic radiation therapy: Secondary | ICD-10-CM | POA: Diagnosis not present

## 2017-09-28 DIAGNOSIS — C3432 Malignant neoplasm of lower lobe, left bronchus or lung: Secondary | ICD-10-CM | POA: Diagnosis not present

## 2017-09-28 DIAGNOSIS — I714 Abdominal aortic aneurysm, without rupture: Secondary | ICD-10-CM | POA: Diagnosis not present

## 2017-09-28 DIAGNOSIS — Z87891 Personal history of nicotine dependence: Secondary | ICD-10-CM | POA: Diagnosis not present

## 2017-09-28 DIAGNOSIS — C3492 Malignant neoplasm of unspecified part of left bronchus or lung: Secondary | ICD-10-CM

## 2017-09-28 DIAGNOSIS — Z9221 Personal history of antineoplastic chemotherapy: Secondary | ICD-10-CM | POA: Diagnosis not present

## 2017-09-28 DIAGNOSIS — M1990 Unspecified osteoarthritis, unspecified site: Secondary | ICD-10-CM | POA: Diagnosis not present

## 2017-09-28 NOTE — Progress Notes (Signed)
  Radiation Oncology         [phone] ________________________________  Name: [facility] MRN: [id number]  Date: 09/28/2017  DOB: 1945/12/31  SIMULATION AND TREATMENT PLANNING NOTE    ICD-10-CM   1. Adenocarcinoma of left lung, stage 4 ([facility]) C34.92     DIAGNOSIS:  Stage IV (T1a, N3, M1b) non-small cell lung cancer, adenocarcinoma with PDL 1 expression of 80%,  Now with local progression  NARRATIVE:  The patient was brought to the [facility].  Identity was confirmed.  All relevant records and images related to the planned course of therapy were reviewed.  The patient freely provided informed written consent to proceed with treatment after reviewing the details related to the planned course of therapy. The consent form was witnessed and verified by the simulation staff.  Then, the patient was set-up in a stable reproducible  supine position for radiation therapy.  CT images were obtained.  Surface markings were placed.  The CT images were loaded into the planning software.  Then the target and avoidance structures were contoured.  Treatment planning then occurred.  The radiation prescription was entered and confirmed.  Then, I designed and supervised the construction of a total of 5 medically necessary complex treatment devices.  I have requested : 3D Simulation  I have requested a DVH of the following structures: heart, lungs, spinal cord, esophagus, gross tumor volume, planning target volume.  I have ordered:dose calc.  PLAN:  The patient will receive 35 Gy in 14 fractions.  -----------------------------------  Blair Promise, PhD, MD

## 2017-09-29 DIAGNOSIS — Z9221 Personal history of antineoplastic chemotherapy: Secondary | ICD-10-CM | POA: Diagnosis not present

## 2017-09-29 DIAGNOSIS — I714 Abdominal aortic aneurysm, without rupture: Secondary | ICD-10-CM | POA: Diagnosis not present

## 2017-09-29 DIAGNOSIS — Z87891 Personal history of nicotine dependence: Secondary | ICD-10-CM | POA: Diagnosis not present

## 2017-09-29 DIAGNOSIS — Z510 Encounter for antineoplastic radiation therapy: Secondary | ICD-10-CM | POA: Diagnosis not present

## 2017-09-29 DIAGNOSIS — M1990 Unspecified osteoarthritis, unspecified site: Secondary | ICD-10-CM | POA: Diagnosis not present

## 2017-09-29 DIAGNOSIS — C3432 Malignant neoplasm of lower lobe, left bronchus or lung: Secondary | ICD-10-CM | POA: Diagnosis not present

## 2017-10-05 ENCOUNTER — Ambulatory Visit
Admission: RE | Admit: 2017-10-05 | Discharge: 2017-10-05 | Disposition: A | Discharge status: Home / Self Care | Payer: Medicare Other | Source: Ambulatory Visit | Attending: Radiation Oncology | Admitting: Radiation Oncology

## 2017-10-05 ENCOUNTER — Ambulatory Visit: Payer: Medicare Other

## 2017-10-05 DIAGNOSIS — Z510 Encounter for antineoplastic radiation therapy: Secondary | ICD-10-CM | POA: Diagnosis not present

## 2017-10-05 DIAGNOSIS — I714 Abdominal aortic aneurysm, without rupture: Secondary | ICD-10-CM | POA: Diagnosis not present

## 2017-10-05 DIAGNOSIS — C3432 Malignant neoplasm of lower lobe, left bronchus or lung: Secondary | ICD-10-CM | POA: Diagnosis not present

## 2017-10-05 DIAGNOSIS — M1990 Unspecified osteoarthritis, unspecified site: Secondary | ICD-10-CM | POA: Diagnosis not present

## 2017-10-05 DIAGNOSIS — Z9221 Personal history of antineoplastic chemotherapy: Secondary | ICD-10-CM | POA: Diagnosis not present

## 2017-10-05 DIAGNOSIS — Z87891 Personal history of nicotine dependence: Secondary | ICD-10-CM | POA: Diagnosis not present

## 2017-10-05 NOTE — Progress Notes (Signed)
  Radiation Oncology         [phone] ________________________________  Name: FOXX KLARICH MRN: [id number]  Date: 10/05/2017  DOB: 02-15-1946  Simulation Verification Note    ICD-10-CM   1. Malignant neoplasm of lower lobe of left lung ([place]) C34.32     Status: outpatient  NARRATIVE: The patient was brought to the treatment unit and placed in the planned treatment position. The clinical setup was verified. Then port films were obtained and uploaded to the radiation oncology medical record software.  The treatment beams were carefully compared against the planned radiation fields. The position location and shape of the radiation fields was reviewed. They targeted volume of tissue appears to be appropriately covered by the radiation beams. Organs at risk appear to be excluded as planned.  Based on my personal review, I approved the simulation verification. The patient's treatment will proceed as planned.  -----------------------------------  Blair Promise, PhD, MD

## 2017-10-06 ENCOUNTER — Ambulatory Visit: Payer: Medicare Other | Admitting: Radiation Oncology

## 2017-10-06 ENCOUNTER — Ambulatory Visit
Admission: RE | Admit: 2017-10-06 | Discharge: 2017-10-06 | Disposition: A | Discharge status: Home / Self Care | Payer: Medicare Other | Source: Ambulatory Visit | Attending: Radiation Oncology | Admitting: Radiation Oncology

## 2017-10-06 DIAGNOSIS — C3432 Malignant neoplasm of lower lobe, left bronchus or lung: Secondary | ICD-10-CM | POA: Diagnosis not present

## 2017-10-06 DIAGNOSIS — M1990 Unspecified osteoarthritis, unspecified site: Secondary | ICD-10-CM | POA: Diagnosis not present

## 2017-10-06 DIAGNOSIS — Z510 Encounter for antineoplastic radiation therapy: Secondary | ICD-10-CM | POA: Diagnosis not present

## 2017-10-06 DIAGNOSIS — Z87891 Personal history of nicotine dependence: Secondary | ICD-10-CM | POA: Diagnosis not present

## 2017-10-06 DIAGNOSIS — C3492 Malignant neoplasm of unspecified part of left bronchus or lung: Secondary | ICD-10-CM

## 2017-10-06 DIAGNOSIS — Z9221 Personal history of antineoplastic chemotherapy: Secondary | ICD-10-CM | POA: Diagnosis not present

## 2017-10-06 DIAGNOSIS — I714 Abdominal aortic aneurysm, without rupture: Secondary | ICD-10-CM | POA: Diagnosis not present

## 2017-10-06 MED ORDER — SONAFINE EX EMUL
1.0000 "application " | Freq: Once | CUTANEOUS | Status: AC
  Administered 2017-10-06: 1 via TOPICAL

## 2017-10-06 NOTE — Progress Notes (Signed)
Pt here for patient teaching.  Pt given Radiation and You booklet and Sonafine.  Reviewed areas of pertinence such as fatigue, skin changes and throat changes . Pt able to give teach back of to pat skin and use unscented/gentle soap,apply Sonafine bid and avoid applying anything to skin within 4 hours of treatment. Pt demonstrated understanding and verbalizes understanding of information given and will contact nursing with any questions or concerns.

## 2017-10-07 ENCOUNTER — Ambulatory Visit
Admission: RE | Admit: 2017-10-07 | Discharge: 2017-10-07 | Disposition: A | Discharge status: Home / Self Care | Payer: Medicare Other | Source: Ambulatory Visit | Attending: Radiation Oncology | Admitting: Radiation Oncology

## 2017-10-07 DIAGNOSIS — M1990 Unspecified osteoarthritis, unspecified site: Secondary | ICD-10-CM | POA: Diagnosis not present

## 2017-10-07 DIAGNOSIS — Z510 Encounter for antineoplastic radiation therapy: Secondary | ICD-10-CM | POA: Diagnosis not present

## 2017-10-07 DIAGNOSIS — Z87891 Personal history of nicotine dependence: Secondary | ICD-10-CM | POA: Diagnosis not present

## 2017-10-07 DIAGNOSIS — I714 Abdominal aortic aneurysm, without rupture: Secondary | ICD-10-CM | POA: Diagnosis not present

## 2017-10-07 DIAGNOSIS — Z9221 Personal history of antineoplastic chemotherapy: Secondary | ICD-10-CM | POA: Diagnosis not present

## 2017-10-07 DIAGNOSIS — C3432 Malignant neoplasm of lower lobe, left bronchus or lung: Secondary | ICD-10-CM | POA: Diagnosis not present

## 2017-10-08 ENCOUNTER — Ambulatory Visit
Admission: RE | Admit: 2017-10-08 | Discharge: 2017-10-08 | Disposition: A | Discharge status: Home / Self Care | Payer: Medicare Other | Source: Ambulatory Visit | Attending: Radiation Oncology | Admitting: Radiation Oncology

## 2017-10-08 DIAGNOSIS — C3432 Malignant neoplasm of lower lobe, left bronchus or lung: Secondary | ICD-10-CM | POA: Diagnosis not present

## 2017-10-08 DIAGNOSIS — I714 Abdominal aortic aneurysm, without rupture: Secondary | ICD-10-CM | POA: Diagnosis not present

## 2017-10-08 DIAGNOSIS — Z87891 Personal history of nicotine dependence: Secondary | ICD-10-CM | POA: Diagnosis not present

## 2017-10-08 DIAGNOSIS — M1990 Unspecified osteoarthritis, unspecified site: Secondary | ICD-10-CM | POA: Diagnosis not present

## 2017-10-08 DIAGNOSIS — Z9221 Personal history of antineoplastic chemotherapy: Secondary | ICD-10-CM | POA: Diagnosis not present

## 2017-10-08 DIAGNOSIS — Z510 Encounter for antineoplastic radiation therapy: Secondary | ICD-10-CM | POA: Diagnosis not present

## 2017-10-09 ENCOUNTER — Other Ambulatory Visit: Payer: Self-pay | Admitting: Internal Medicine

## 2017-10-11 ENCOUNTER — Ambulatory Visit
Admission: RE | Admit: 2017-10-11 | Discharge: 2017-10-11 | Disposition: A | Discharge status: Home / Self Care | Payer: Medicare Other | Source: Ambulatory Visit | Attending: Radiation Oncology | Admitting: Radiation Oncology

## 2017-10-11 DIAGNOSIS — I714 Abdominal aortic aneurysm, without rupture: Secondary | ICD-10-CM | POA: Diagnosis not present

## 2017-10-11 DIAGNOSIS — Z510 Encounter for antineoplastic radiation therapy: Secondary | ICD-10-CM | POA: Diagnosis not present

## 2017-10-11 DIAGNOSIS — Z87891 Personal history of nicotine dependence: Secondary | ICD-10-CM | POA: Diagnosis not present

## 2017-10-11 DIAGNOSIS — Z9221 Personal history of antineoplastic chemotherapy: Secondary | ICD-10-CM | POA: Diagnosis not present

## 2017-10-11 DIAGNOSIS — M1990 Unspecified osteoarthritis, unspecified site: Secondary | ICD-10-CM | POA: Diagnosis not present

## 2017-10-11 DIAGNOSIS — C3432 Malignant neoplasm of lower lobe, left bronchus or lung: Secondary | ICD-10-CM | POA: Diagnosis not present

## 2017-10-11 NOTE — Telephone Encounter (Signed)
Please review for refill. Thanks!  

## 2017-10-12 ENCOUNTER — Ambulatory Visit
Admission: RE | Admit: 2017-10-12 | Discharge: 2017-10-12 | Disposition: A | Discharge status: Home / Self Care | Payer: Medicare Other | Source: Ambulatory Visit | Attending: Radiation Oncology | Admitting: Radiation Oncology

## 2017-10-12 ENCOUNTER — Other Ambulatory Visit: Payer: Self-pay | Admitting: Radiation Oncology

## 2017-10-12 DIAGNOSIS — Z87891 Personal history of nicotine dependence: Secondary | ICD-10-CM | POA: Diagnosis not present

## 2017-10-12 DIAGNOSIS — Z510 Encounter for antineoplastic radiation therapy: Secondary | ICD-10-CM | POA: Diagnosis not present

## 2017-10-12 DIAGNOSIS — M1990 Unspecified osteoarthritis, unspecified site: Secondary | ICD-10-CM | POA: Diagnosis not present

## 2017-10-12 DIAGNOSIS — I714 Abdominal aortic aneurysm, without rupture: Secondary | ICD-10-CM | POA: Diagnosis not present

## 2017-10-12 DIAGNOSIS — Z9221 Personal history of antineoplastic chemotherapy: Secondary | ICD-10-CM | POA: Diagnosis not present

## 2017-10-12 DIAGNOSIS — C3432 Malignant neoplasm of lower lobe, left bronchus or lung: Secondary | ICD-10-CM | POA: Diagnosis not present

## 2017-10-12 MED ORDER — HYDROCOD POLST-CPM POLST ER 10-8 MG/5ML PO SUER: 5.0000 mL | Freq: Every evening | ORAL | 0 refills | Status: DC | PRN

## 2017-10-13 ENCOUNTER — Ambulatory Visit
Admission: RE | Admit: 2017-10-13 | Discharge: 2017-10-13 | Disposition: A | Discharge status: Home / Self Care | Payer: Medicare Other | Source: Ambulatory Visit | Attending: Radiation Oncology | Admitting: Radiation Oncology

## 2017-10-13 DIAGNOSIS — C3432 Malignant neoplasm of lower lobe, left bronchus or lung: Secondary | ICD-10-CM | POA: Diagnosis not present

## 2017-10-13 DIAGNOSIS — Z9221 Personal history of antineoplastic chemotherapy: Secondary | ICD-10-CM | POA: Diagnosis not present

## 2017-10-13 DIAGNOSIS — Z510 Encounter for antineoplastic radiation therapy: Secondary | ICD-10-CM | POA: Diagnosis not present

## 2017-10-13 DIAGNOSIS — I714 Abdominal aortic aneurysm, without rupture: Secondary | ICD-10-CM | POA: Diagnosis not present

## 2017-10-13 DIAGNOSIS — M1990 Unspecified osteoarthritis, unspecified site: Secondary | ICD-10-CM | POA: Diagnosis not present

## 2017-10-13 DIAGNOSIS — Z87891 Personal history of nicotine dependence: Secondary | ICD-10-CM | POA: Diagnosis not present

## 2017-10-14 ENCOUNTER — Ambulatory Visit
Admission: RE | Admit: 2017-10-14 | Discharge: 2017-10-14 | Disposition: A | Discharge status: Home / Self Care | Payer: Medicare Other | Source: Ambulatory Visit | Attending: Radiation Oncology | Admitting: Radiation Oncology

## 2017-10-14 DIAGNOSIS — Z9221 Personal history of antineoplastic chemotherapy: Secondary | ICD-10-CM | POA: Diagnosis not present

## 2017-10-14 DIAGNOSIS — I714 Abdominal aortic aneurysm, without rupture: Secondary | ICD-10-CM | POA: Diagnosis not present

## 2017-10-14 DIAGNOSIS — Z510 Encounter for antineoplastic radiation therapy: Secondary | ICD-10-CM | POA: Diagnosis not present

## 2017-10-14 DIAGNOSIS — C3432 Malignant neoplasm of lower lobe, left bronchus or lung: Secondary | ICD-10-CM | POA: Diagnosis not present

## 2017-10-14 DIAGNOSIS — Z87891 Personal history of nicotine dependence: Secondary | ICD-10-CM | POA: Diagnosis not present

## 2017-10-14 DIAGNOSIS — M1990 Unspecified osteoarthritis, unspecified site: Secondary | ICD-10-CM | POA: Diagnosis not present

## 2017-10-15 ENCOUNTER — Ambulatory Visit
Admission: RE | Admit: 2017-10-15 | Discharge: 2017-10-15 | Disposition: A | Discharge status: Home / Self Care | Payer: Medicare Other | Source: Ambulatory Visit | Attending: Radiation Oncology | Admitting: Radiation Oncology

## 2017-10-15 DIAGNOSIS — I714 Abdominal aortic aneurysm, without rupture: Secondary | ICD-10-CM | POA: Diagnosis not present

## 2017-10-15 DIAGNOSIS — Z9221 Personal history of antineoplastic chemotherapy: Secondary | ICD-10-CM | POA: Diagnosis not present

## 2017-10-15 DIAGNOSIS — Z87891 Personal history of nicotine dependence: Secondary | ICD-10-CM | POA: Diagnosis not present

## 2017-10-15 DIAGNOSIS — C3432 Malignant neoplasm of lower lobe, left bronchus or lung: Secondary | ICD-10-CM | POA: Diagnosis not present

## 2017-10-15 DIAGNOSIS — M1990 Unspecified osteoarthritis, unspecified site: Secondary | ICD-10-CM | POA: Diagnosis not present

## 2017-10-15 DIAGNOSIS — Z510 Encounter for antineoplastic radiation therapy: Secondary | ICD-10-CM | POA: Diagnosis not present

## 2017-10-17 ENCOUNTER — Ambulatory Visit
Admission: RE | Admit: 2017-10-17 | Discharge: 2017-10-17 | Disposition: A | Discharge status: Home / Self Care | Payer: Medicare Other | Source: Ambulatory Visit | Attending: Radiation Oncology | Admitting: Radiation Oncology

## 2017-10-17 DIAGNOSIS — C3432 Malignant neoplasm of lower lobe, left bronchus or lung: Secondary | ICD-10-CM | POA: Diagnosis not present

## 2017-10-17 DIAGNOSIS — M1990 Unspecified osteoarthritis, unspecified site: Secondary | ICD-10-CM | POA: Diagnosis not present

## 2017-10-17 DIAGNOSIS — Z9221 Personal history of antineoplastic chemotherapy: Secondary | ICD-10-CM | POA: Diagnosis not present

## 2017-10-17 DIAGNOSIS — I714 Abdominal aortic aneurysm, without rupture: Secondary | ICD-10-CM | POA: Diagnosis not present

## 2017-10-17 DIAGNOSIS — Z87891 Personal history of nicotine dependence: Secondary | ICD-10-CM | POA: Diagnosis not present

## 2017-10-17 DIAGNOSIS — Z510 Encounter for antineoplastic radiation therapy: Secondary | ICD-10-CM | POA: Diagnosis not present

## 2017-10-18 ENCOUNTER — Ambulatory Visit
Admission: RE | Admit: 2017-10-18 | Discharge: 2017-10-18 | Disposition: A | Discharge status: Home / Self Care | Payer: Medicare Other | Source: Ambulatory Visit | Attending: Radiation Oncology | Admitting: Radiation Oncology

## 2017-10-18 ENCOUNTER — Ambulatory Visit: Payer: Medicare Other

## 2017-10-18 DIAGNOSIS — Z9221 Personal history of antineoplastic chemotherapy: Secondary | ICD-10-CM | POA: Diagnosis not present

## 2017-10-18 DIAGNOSIS — Z510 Encounter for antineoplastic radiation therapy: Secondary | ICD-10-CM | POA: Diagnosis not present

## 2017-10-18 DIAGNOSIS — I714 Abdominal aortic aneurysm, without rupture: Secondary | ICD-10-CM | POA: Diagnosis not present

## 2017-10-18 DIAGNOSIS — M1990 Unspecified osteoarthritis, unspecified site: Secondary | ICD-10-CM | POA: Diagnosis not present

## 2017-10-18 DIAGNOSIS — Z87891 Personal history of nicotine dependence: Secondary | ICD-10-CM | POA: Diagnosis not present

## 2017-10-18 DIAGNOSIS — C3432 Malignant neoplasm of lower lobe, left bronchus or lung: Secondary | ICD-10-CM | POA: Diagnosis not present

## 2017-10-19 ENCOUNTER — Ambulatory Visit
Admission: RE | Admit: 2017-10-19 | Discharge: 2017-10-19 | Disposition: A | Discharge status: Home / Self Care | Payer: Medicare Other | Source: Ambulatory Visit | Attending: Radiation Oncology | Admitting: Radiation Oncology

## 2017-10-19 DIAGNOSIS — Z9221 Personal history of antineoplastic chemotherapy: Secondary | ICD-10-CM | POA: Diagnosis not present

## 2017-10-19 DIAGNOSIS — Z510 Encounter for antineoplastic radiation therapy: Secondary | ICD-10-CM | POA: Diagnosis not present

## 2017-10-19 DIAGNOSIS — C3432 Malignant neoplasm of lower lobe, left bronchus or lung: Secondary | ICD-10-CM | POA: Diagnosis not present

## 2017-10-19 DIAGNOSIS — I714 Abdominal aortic aneurysm, without rupture: Secondary | ICD-10-CM | POA: Diagnosis not present

## 2017-10-19 DIAGNOSIS — M1990 Unspecified osteoarthritis, unspecified site: Secondary | ICD-10-CM | POA: Diagnosis not present

## 2017-10-19 DIAGNOSIS — Z87891 Personal history of nicotine dependence: Secondary | ICD-10-CM | POA: Diagnosis not present

## 2017-10-20 ENCOUNTER — Ambulatory Visit
Admission: RE | Admit: 2017-10-20 | Discharge: 2017-10-20 | Disposition: A | Discharge status: Home / Self Care | Payer: Medicare Other | Source: Ambulatory Visit | Attending: Radiation Oncology | Admitting: Radiation Oncology

## 2017-10-20 DIAGNOSIS — M1990 Unspecified osteoarthritis, unspecified site: Secondary | ICD-10-CM | POA: Diagnosis not present

## 2017-10-20 DIAGNOSIS — Z9221 Personal history of antineoplastic chemotherapy: Secondary | ICD-10-CM | POA: Diagnosis not present

## 2017-10-20 DIAGNOSIS — Z87891 Personal history of nicotine dependence: Secondary | ICD-10-CM | POA: Diagnosis not present

## 2017-10-20 DIAGNOSIS — I714 Abdominal aortic aneurysm, without rupture: Secondary | ICD-10-CM | POA: Diagnosis not present

## 2017-10-20 DIAGNOSIS — Z510 Encounter for antineoplastic radiation therapy: Secondary | ICD-10-CM | POA: Diagnosis not present

## 2017-10-20 DIAGNOSIS — C3432 Malignant neoplasm of lower lobe, left bronchus or lung: Secondary | ICD-10-CM | POA: Diagnosis not present

## 2017-10-22 ENCOUNTER — Telehealth: Payer: Self-pay | Admitting: *Deleted

## 2017-10-22 NOTE — Telephone Encounter (Signed)
Pt called requesting a call back from nurse.  Spoke with pt and was informed that pt experiencing chest pain - not cardiac related per pt.  Stated having problem with eating - happens after eating, pt has to wait for a few minutes before foods could go down - like food stuck in the chest for a short time.  Denied nausea/vomiting.  Stated it started on Tues 11/20, and gradually worsened.  No problems with drinking warm fluids, until today, warm liquids began to bother pt. Pt stated he would discuss this problem with the nurse at next radiation appt on Mon 10/25/17. Pt has appt with Dr. [name] on  11/03/17. Pt understood if swallowing problem , and/or chest pain worsened over the weekend, pt needs to go to ER for further evaluation. Pt's    Phone    [phone].

## 2017-10-25 ENCOUNTER — Other Ambulatory Visit: Payer: Self-pay | Admitting: Radiation Oncology

## 2017-10-25 ENCOUNTER — Encounter: Payer: Self-pay | Admitting: Radiation Oncology

## 2017-10-25 ENCOUNTER — Ambulatory Visit: Payer: Medicare Other

## 2017-10-25 ENCOUNTER — Ambulatory Visit
Admission: RE | Admit: 2017-10-25 | Discharge: 2017-10-25 | Disposition: A | Discharge status: Home / Self Care | Payer: Medicare Other | Source: Ambulatory Visit | Attending: Radiation Oncology | Admitting: Radiation Oncology

## 2017-10-25 DIAGNOSIS — I714 Abdominal aortic aneurysm, without rupture: Secondary | ICD-10-CM | POA: Diagnosis not present

## 2017-10-25 DIAGNOSIS — Z9221 Personal history of antineoplastic chemotherapy: Secondary | ICD-10-CM | POA: Diagnosis not present

## 2017-10-25 DIAGNOSIS — Z510 Encounter for antineoplastic radiation therapy: Secondary | ICD-10-CM | POA: Diagnosis not present

## 2017-10-25 DIAGNOSIS — C3432 Malignant neoplasm of lower lobe, left bronchus or lung: Secondary | ICD-10-CM | POA: Diagnosis not present

## 2017-10-25 DIAGNOSIS — Z87891 Personal history of nicotine dependence: Secondary | ICD-10-CM | POA: Diagnosis not present

## 2017-10-25 DIAGNOSIS — M1990 Unspecified osteoarthritis, unspecified site: Secondary | ICD-10-CM | POA: Diagnosis not present

## 2017-10-25 MED ORDER — OXYCODONE-ACETAMINOPHEN 5-325 MG PO TABS: 1.0000 | ORAL_TABLET | ORAL | 0 refills | Status: DC | PRN

## 2017-10-25 MED ORDER — SUCRALFATE 1 G PO TABS: 1.0000 g | ORAL_TABLET | Freq: Three times a day (TID) | ORAL | 1 refills | Status: DC

## 2017-10-25 NOTE — Progress Notes (Signed)
  Radiation Oncology         [phone] ________________________________  Name: [facility] MRN: [id number]  Date: 10/25/2017  DOB: 07-Jun-1946  End of Treatment Note  Diagnosis:  Stage IV (T1a, N3, M1b) non-small cell lung cancer, adenocarcinoma with PDL 1 expression of 80%, now with local progression  Indication for treatment:  Palliative  Radiation treatment dates:   10/05/17-10/25/17  Site/dose:  Chest-Lung_Lt, 35 Gy delivered in 14 fractions   Beams/energy:  3D Photon, 10X/6X  Narrative: The patient tolerated radiation treatment relatively well. Initially, the patient had a persistent dry cough which caused him centralized pain secondary to only coughing. This developed to 7/10 pain associated with esophagitis which we have prescribed him a short course of both Carafate and percocet for. He also had some shortness of breath which completely resolved. He did not have any issues with hemoptysis. Otherwise, he was without acute complaints.   Plan: The patient has completed radiation treatment. The patient will return to radiation oncology clinic for routine followup in one month. I advised them to call or return sooner if they have any questions or concerns related to their recovery or treatment.  -----------------------------------  Blair Promise, PhD, MD  This document serves as a record of services personally performed by Gery Pray, MD. It was created on his behalf by Reola Mosher, a trained medical scribe. The creation of this record is based on the scribe's personal observations and the provider's statements to them. This document has been checked and approved by the attending provider.

## 2017-10-26 ENCOUNTER — Ambulatory Visit: Payer: Medicare Other

## 2017-10-27 ENCOUNTER — Ambulatory Visit: Payer: Medicare Other

## 2017-10-28 ENCOUNTER — Ambulatory Visit: Payer: Medicare Other

## 2017-11-03 ENCOUNTER — Ambulatory Visit (HOSPITAL_BASED_OUTPATIENT_CLINIC_OR_DEPARTMENT_OTHER): Payer: Medicare Other | Admitting: Internal Medicine

## 2017-11-03 ENCOUNTER — Encounter: Payer: Self-pay | Admitting: Internal Medicine

## 2017-11-03 ENCOUNTER — Other Ambulatory Visit (HOSPITAL_BASED_OUTPATIENT_CLINIC_OR_DEPARTMENT_OTHER): Payer: Medicare Other

## 2017-11-03 ENCOUNTER — Ambulatory Visit: Payer: Medicare Other

## 2017-11-03 ENCOUNTER — Encounter: Payer: Self-pay | Admitting: *Deleted

## 2017-11-03 ENCOUNTER — Telehealth: Payer: Self-pay | Admitting: Internal Medicine

## 2017-11-03 DIAGNOSIS — I10 Essential (primary) hypertension: Secondary | ICD-10-CM | POA: Diagnosis not present

## 2017-11-03 DIAGNOSIS — C801 Malignant (primary) neoplasm, unspecified: Secondary | ICD-10-CM

## 2017-11-03 DIAGNOSIS — C3432 Malignant neoplasm of lower lobe, left bronchus or lung: Secondary | ICD-10-CM | POA: Diagnosis not present

## 2017-11-03 DIAGNOSIS — C3490 Malignant neoplasm of unspecified part of unspecified bronchus or lung: Secondary | ICD-10-CM

## 2017-11-03 DIAGNOSIS — Z95828 Presence of other vascular implants and grafts: Secondary | ICD-10-CM

## 2017-11-03 DIAGNOSIS — F419 Anxiety disorder, unspecified: Secondary | ICD-10-CM

## 2017-11-03 DIAGNOSIS — C3492 Malignant neoplasm of unspecified part of left bronchus or lung: Secondary | ICD-10-CM

## 2017-11-03 DIAGNOSIS — I3131 Malignant pericardial effusion in diseases classified elsewhere: Secondary | ICD-10-CM

## 2017-11-03 DIAGNOSIS — I313 Pericardial effusion (noninflammatory): Principal | ICD-10-CM

## 2017-11-03 DIAGNOSIS — Z79899 Other long term (current) drug therapy: Secondary | ICD-10-CM | POA: Diagnosis not present

## 2017-11-03 LAB — COMPREHENSIVE METABOLIC PANEL
ALT: 30 U/L (ref 0–55)
AST: 34 U/L (ref 5–34)
Albumin: 3.3 g/dL — ABNORMAL LOW (ref 3.5–5.0)
Alkaline Phosphatase: 68 U/L (ref 40–150)
Anion Gap: 10 mEq/L (ref 3–11)
BUN: 10.9 mg/dL (ref 7.0–26.0)
CHLORIDE: 107 meq/L (ref 98–109)
CO2: 25 meq/L (ref 22–29)
CREATININE: 1.2 mg/dL (ref 0.7–1.3)
Calcium: 8.9 mg/dL (ref 8.4–10.4)
EGFR: >60 mL/min/{1.73_m2} (ref >60)
GLUCOSE: 98 mg/dL (ref 70–140)
POTASSIUM: 4 meq/L (ref 3.5–5.1)
SODIUM: 142 meq/L (ref 136–145)
Total Bilirubin: 0.49 mg/dL (ref 0.20–1.20)
Total Protein: 6.9 g/dL (ref 6.4–8.3)

## 2017-11-03 LAB — CBC WITH DIFFERENTIAL/PLATELET
BASO%: 0.2 % (ref 0.0–2.0)
BASOS ABS: 0 10*3/uL (ref 0.0–0.1)
EOS ABS: 0.1 10*3/uL (ref 0.0–0.5)
EOS%: 1.8 % (ref 0.0–7.0)
HCT: 42.5 % (ref 38.4–49.9)
HGB: 13.3 g/dL (ref 13.0–17.1)
LYMPH%: 16.5 % (ref 14.0–49.0)
MCH: 34 pg — AB (ref 27.2–33.4)
MCHC: 31.3 g/dL — AB (ref 32.0–36.0)
MCV: 108.7 fL — AB (ref 79.3–98.0)
MONO#: 0.6 10*3/uL (ref 0.1–0.9)
MONO%: 10.3 % (ref 0.0–14.0)
NEUT%: 71.2 % (ref 39.0–75.0)
NEUTROS ABS: 3.9 10*3/uL (ref 1.5–6.5)
Platelets: 100 10*3/uL — ABNORMAL LOW (ref 140–400)
RBC: 3.91 10*6/uL — ABNORMAL LOW (ref 4.20–5.82)
RDW: 16.5 % — ABNORMAL HIGH (ref 11.0–14.6)
WBC: 5.5 10*3/uL (ref 4.0–10.3)
lymph#: 0.9 10*3/uL (ref 0.9–3.3)

## 2017-11-03 LAB — UA PROTEIN, DIPSTICK - CHCC: PROTEIN: 30 mg/dL

## 2017-11-03 MED ORDER — SODIUM CHLORIDE 0.9% FLUSH
10.0000 mL | INTRAVENOUS | Status: DC | PRN
  Filled 2017-11-03: qty 10

## 2017-11-03 MED ORDER — HEPARIN SOD (PORK) LOCK FLUSH 100 UNIT/ML IV SOLN
500.0000 [IU] | Freq: Once | INTRAVENOUS | Status: DC | PRN
  Filled 2017-11-03: qty 5

## 2017-11-03 MED ORDER — DEXAMETHASONE 4 MG PO TABS: ORAL_TABLET | ORAL | 1 refills | Status: DC

## 2017-11-03 NOTE — Progress Notes (Signed)
Pt had labs drawn periphely  In lab today per patient's request

## 2017-11-03 NOTE — Progress Notes (Signed)
DISCONTINUE ON PATHWAY REGIMEN - Non-Small Cell Lung     A cycle is every 21 days:     Pemetrexed   **Always confirm dose/schedule in your pharmacy ordering system**    REASON: Disease Progression PRIOR TREATMENT: [id number]: Pemetrexed 500 mg/m2 q21 Days Until Progression or Unacceptable Toxicity TREATMENT RESPONSE: Progressive Disease (PD)  START ON PATHWAY REGIMEN - Non-Small Cell Lung     A cycle is every 21 days:     Ramucirumab      Docetaxel   **Always confirm dose/schedule in your pharmacy ordering system**    Patient Characteristics: Stage IV Metastatic, Nonsquamous, Third Line - Chemotherapy/Immunotherapy, PS = 0, 1, Prior PD-1/PD-L1 Inhibitor or No Prior PD-1/PD-L1 Inhibitor and Not a Candidate for Immunotherapy AJCC T Category: T1a Current Disease Status: Distant Metastases AJCC N Category: N3 AJCC M Category: M1b AJCC 8 Stage Grouping: IVA Histology: Nonsquamous Cell ROS1 Rearrangement Status: Awaiting Test Results T790M Mutation Status: Not Applicable - EGFR Mutation Negative/Unknown Other Mutations/Biomarkers: No Other Actionable Mutations PD-L1 Expression Status: PD-L1 Positive ? 50% (TPS) Chemotherapy/Immunotherapy LOT: Third Line Chemotherapy/Immunotherapy Molecular Targeted Therapy: Not Appropriate ALK Translocation Status: Awaiting Test Results Would you be surprised if this patient died  in the next year<= I would NOT be surprised if this patient died in the next year EGFR Mutation Status: Awaiting Test Results BRAF V600E Mutation Status: Awaiting Test Results Performance Status: PS = 0, 1 Immunotherapy Candidate Status: Not a Candidate for Immunotherapy Prior Immunotherapy Status: Prior PD-1/PD-L1 Inhibitor Intent of Therapy: Non-Curative / Palliative Intent, Discussed with Patient

## 2017-11-03 NOTE — Progress Notes (Signed)
[facility] [phone]   [phone]  OFFICE PROGRESS NOTE  Rankins, [name], MD [place] [address]  DIAGNOSIS: Stage IV (T1a, N3, M1b) non-small cell lung cancer, adenocarcinoma with PDL 1 expression of 80%. 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  PRIOR THERAPY:  1) Treatment with immunotherapy with [name] (pembrolizumab) 200 MG IV every 3 weeks status post 6 cycles discontinued secondary to disease progression. 2) Systemic chemotherapy with carboplatin for AUC of 5, Alimta 500 MG/M2 and Avastin 15 MG/KG every 3 weeks. First dose 02/24/2017. Status post 6 cycles. Last dose was given 06/09/2017. 3) Maintenance treatment with Alimta 500 MG/M2 and Avastin 15 MG/KG every 3 weeks. First dose 07/21/2017. Status post 3 cycles. His discontinued secondary to disease progression. 4) palliative radiotherapy to the progressive left hilar soft tissue mass under the care of Dr. Sondra Come completed October 25, 2017.  CURRENT THERAPY: Systemic chemotherapy with docetaxel 75 mg/M2 and Cyramza 10 mg/KG every 3 weeks.  First dose November 25, 2017.  INTERVAL HISTORY: Nicholas Hale 71 y.o. male returns to the clinic today for follow-up visit accompanied by his wife.  The patient tolerated the previous course of palliative radiotherapy to the left hilar soft tissue mass fairly well.  He noticed some improvement in his breathing.  He denied having any chest pain, cough or hemoptysis.  He continues to have mild odynophagia.  The patient denied having any significant nausea, vomiting, diarrhea or constipation.  He lost around 10 pounds in the last few weeks during his radiation therapy.  He is here today for evaluation and discussion of his treatment options.  MEDICAL  HISTORY: Past Medical History:  Diagnosis Date  . Abdominal aortic aneurysm ([facility])   . Arthritis    "hips; lower spine" (05/24/2014)  . Chemotherapy induced neutropenia ([facility]) 03/31/2017  . Chronic fatigue 10/05/2016  . Encounter for antineoplastic chemotherapy 02/17/2017  . Hyperlipidemia   . Hypertension   . Lung cancer ([facility]) 09/25/2016  . PE (pulmonary embolism) 9/15  . Pericardial effusion 09/20/2016   Malignant effusion s/p pericardial drain  . Personal history of DVT (deep vein thrombosis) 9/15    ALLERGIES:  has No Known Allergies.  MEDICATIONS:  Current Outpatient Medications  Medication Sig Dispense Refill  . albuterol (PROVENTIL HFA;VENTOLIN HFA) 108 (90 Base) MCG/ACT inhaler Inhale 2 puffs into the lungs every 4 (four) hours as needed for wheezing or shortness of breath. 1 Inhaler 5  . atorvastatin (LIPITOR) 20 MG tablet TAKE 1 TABLET BY MOUTH EVERY DAY AT 6PM 90 tablet 2  . chlorpheniramine-HYDROcodone (TUSSIONEX) 10-8 MG/5ML SUER Take 5 mLs at bedtime as needed by mouth for cough. 115 mL 0  . dexamethasone (DECADRON) 4 MG tablet 4 mg by mouth twice a day the day before, day of and day after the chemotherapy every 3 weeks. 40 tablet 1  . dextromethorphan-guaiFENesin (MUCINEX DM) 30-600 MG 12hr tablet Take 2 tablets by mouth 2 (two) times daily.    Marland Kitchen lidocaine-prilocaine (EMLA) cream Apply 1 application topically as needed (on port site).     . magic mouthwash SOLN Take 5 mLs by mouth 4 (four) times daily. Prednisone 80 mg , Nystatin 30 ml, Benadryl 12.5 mg/5 ml. Mix to 240 ml, 1:1:1 concentration. 240 mL 1  . oxyCODONE-acetaminophen (PERCOCET/ROXICET) 5-325 MG tablet Take 1 tablet  by mouth every 4 (four) hours as needed for severe pain. 30 tablet 0  . prochlorperazine (COMPAZINE) 10 MG tablet Take 1 tablet (10 mg total) by mouth every 6 (six) hours as needed for nausea or vomiting. 30 tablet 0  . sucralfate (CARAFATE) 1 g tablet Take 1 tablet (1 g total) by mouth 4 (four) times  daily -  with meals and at bedtime. Crush and dissolve in 10 cc of water prior to swallowing 30 tablet 1  . XARELTO 20 MG TABS tablet Take 20 mg by mouth daily.  5   No current facility-administered medications for this visit.    Facility-Administered Medications Ordered in Other Visits  Medication Dose Route Frequency Provider Last Rate Last Dose  . heparin lock flush 100 unit/mL  500 Units Intravenous Once PRN [name], MD      . sodium chloride flush (NS) 0.9 % injection 10 mL  10 mL Intravenous PRN [name], MD   10 mL at 08/11/17 0940  . sodium chloride flush (NS) 0.9 % injection 10 mL  10 mL Intravenous PRN [name], MD        SURGICAL HISTORY:  Past Surgical History:  Procedure Laterality Date  . ANTERIOR FUSION CERVICAL SPINE  ~ 2005  . APPENDECTOMY  1978  . CARDIAC CATHETERIZATION N/A 09/21/2016   Procedure: Pericardiocentesis;  Surgeon: Jettie Booze, MD;  Location: [facility];  Service: Cardiovascular;  Laterality: N/A;  . COLONOSCOPY    . HEMIARTHROPLASTY SHOULDER FRACTURE Left 12/2008  . INGUINAL HERNIA REPAIR Bilateral 1992  . IR GENERIC HISTORICAL  02/01/2017   IR FLUORO GUIDE PORT INSERTION RIGHT 02/01/2017 Arne Cleveland, MD [facility]  . IR GENERIC HISTORICAL  02/01/2017   IR US GUIDE VASC ACCESS RIGHT 02/01/2017 Arne Cleveland, MD [facility]  . JOINT REPLACEMENT    . PERICARDIAL FLUID DRAINAGE  09/21/2016  . TOTAL HIP ARTHROPLASTY Left 05/23/2014   Procedure: TOTAL HIP ARTHROPLASTY;  Surgeon: Kerin Salen, MD;  Location: [facility];  Service: Orthopedics;  Laterality: Left;  . TOTAL HIP ARTHROPLASTY Right 05/15/2015   Procedure: TOTAL HIP ARTHROPLASTY;  Surgeon: Frederik Pear, MD;  Location: [facility];  Service: Orthopedics;  Laterality: Right;    REVIEW OF SYSTEMS:  Constitutional: positive for weight loss Eyes: negative Ears, nose, mouth, throat, and face: negative Respiratory: negative Cardiovascular: negative Gastrointestinal:  positive for odynophagia Genitourinary:negative Integument/breast: negative Hematologic/lymphatic: negative Musculoskeletal:negative Neurological: negative Behavioral/Psych: negative Endocrine: negative Allergic/Immunologic: negative   PHYSICAL EXAMINATION: General appearance: alert, cooperative and no distress Head: Normocephalic, without obvious abnormality, atraumatic Neck: no adenopathy, no JVD, supple, symmetrical, trachea midline and thyroid not enlarged, symmetric, no tenderness/mass/nodules Lymph nodes: Cervical, supraclavicular, and axillary nodes normal. Resp: clear to auscultation bilaterally Back: symmetric, no curvature. ROM normal. No CVA tenderness. Cardio: regular rate and rhythm, S1, S2 normal, no murmur, click, rub or gallop GI: soft, non-tender; bowel sounds normal; no masses,  no organomegaly Extremities: extremities normal, atraumatic, no cyanosis or edema Neurologic: Alert and oriented X 3, normal strength and tone. Normal symmetric reflexes. Normal coordination and gait  ECOG PERFORMANCE STATUS: 1 - Symptomatic but completely ambulatory  Blood pressure 129/66, pulse 69, temperature 97.9 F (36.6 C), temperature source Oral, resp. rate 19, height 5\' 10"  (1.778 m), weight 191 lb 9.6 oz (86.9 kg), SpO2 100 %.  LABORATORY DATA: Lab Results  Component Value Date   WBC 5.5 11/03/2017   HGB 13.3 11/03/2017   HCT 42.5 11/03/2017   MCV 108.7 (  H) 11/03/2017   PLT 100 (L) 11/03/2017      Chemistry      Component Value Date/Time   NA 140 09/22/2017 0943   K 4.0 09/22/2017 0943   CL 106 08/21/2017 0926   CO2 20 (L) 09/22/2017 0943   BUN 13.9 09/22/2017 0943   CREATININE 1.2 09/22/2017 0943      Component Value Date/Time   CALCIUM 8.8 09/22/2017 0943   ALKPHOS 78 09/22/2017 0943   AST 30 09/22/2017 0943   ALT 55 09/22/2017 0943   BILITOT 0.61 09/22/2017 0943       RADIOGRAPHIC STUDIES: No results found.  ASSESSMENT AND PLAN:  This is a very  pleasant 71 years old white male with stage IV non-small cell lung cancer, adenocarcinoma with positive PDL 1 expression of 80% status post 6 cycles of treatment with [place] discontinued secondary to disease progression.  The patient underwent systemic chemotherapy with carboplatin, Alimta and Avastin status post 6 cycles.  He patient was started on maintenance treatment with Alimta and Avastin status post 3 cycles. He tolerated the treatment well except for fatigue. His restaging scan after the 3 cycles of the maintenance chemotherapy showed evidence for disease progression with increase in left hilar soft tissue mass with encasement of the left lower lobe bronchus and pulmonary artery. The patient underwent palliative radiotherapy to this area under the care of Dr. Sondra Come. He is feeling a little bit better. I had a lengthy discussion with the patient and his wife about his current condition and treatment options. I recommended for the patient to have repeat CT scan of the chest, abdomen and pelvis in 3 weeks for restaging of his disease and to see the response to the palliative radiotherapy. Also discussed with the patient other systemic treatment options including palliative care versus consideration of palliative systemic chemotherapy with docetaxel 75 mg/M2 and Cyramza 10 mg/KG every 3 weeks with Neulasta support. The patient is interested in proceeding with systemic chemotherapy and he is expected to start the first cycle of this treatment on November 25, 2017. I discussed with the patient and his wife the adverse effect of this treatment including but not limited to alopecia, myelosuppression, nausea and vomiting, peripheral neuropathy, liver or renal dysfunction as well as the adverse effect of Cyramza including GI perforation, pulmonary hemorrhage and wound healing delay. He will come back for follow-up visit on November 25, 2017 for evaluation before starting the first cycle of his  treatment. I would also start the patient on Decadron premedication with 8 mg p.o. twice daily the day before, day of and day after chemotherapy every 3 weeks. He was advised to call immediately if he has any concerning symptoms in the interval. 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 For the uncontrolled hypertension, the patient has a lot of anxiety today about his scan results and I strongly recommended for him to monitor his blood pressure closely at home and to report to his primary care physician if no improvement in his condition. The patient was advised to call immediately if he has any concerning symptoms in the interval. The patient voices understanding of current disease status and treatment options and is in agreement with the current care plan. All questions were answered. The patient knows to call the clinic with any problems, questions or concerns. We can certainly see the patient much sooner if necessary.  Disclaimer: This note was dictated with voice recognition software. Similar sounding words can inadvertently be transcribed  and may not be corrected upon review.

## 2017-11-03 NOTE — Telephone Encounter (Signed)
Gave avs and calendar for December and january

## 2017-11-03 NOTE — Patient Instructions (Signed)
Implanted Port Home Guide An implanted port is a type of central line that is placed under the skin. Central lines are used to provide IV access when treatment or nutrition needs to be given through a person's veins. Implanted ports are used for long-term IV access. An implanted port may be placed because:  You need IV medicine that would be irritating to the small veins in your hands or arms.  You need long-term IV medicines, such as antibiotics.  You need IV nutrition for a long period.  You need frequent blood draws for lab tests.  You need dialysis.  Implanted ports are usually placed in the chest area, but they can also be placed in the upper arm, the abdomen, or the leg. An implanted port has two main parts:  Reservoir. The reservoir is round and will appear as a small, raised area under your skin. The reservoir is the part where a needle is inserted to give medicines or draw blood.  Catheter. The catheter is a thin, flexible tube that extends from the reservoir. The catheter is placed into a large vein. Medicine that is inserted into the reservoir goes into the catheter and then into the vein.  How will I care for my incision site? Do not get the incision site wet. Bathe or shower as directed by your health care provider. How is my port accessed? Special steps must be taken to access the port:  Before the port is accessed, a numbing cream can be placed on the skin. This helps numb the skin over the port site.  Your health care provider uses a sterile technique to access the port. ? Your health care provider must put on a mask and sterile gloves. ? The skin over your port is cleaned carefully with an antiseptic and allowed to dry. ? The port is gently pinched between sterile gloves, and a needle is inserted into the port.  Only "non-coring" port needles should be used to access the port. Once the port is accessed, a blood return should be checked. This helps ensure that the port  is in the vein and is not clogged.  If your port needs to remain accessed for a constant infusion, a clear (transparent) bandage will be placed over the needle site. The bandage and needle will need to be changed every week, or as directed by your health care provider.  Keep the bandage covering the needle clean and dry. Do not get it wet. Follow your health care provider's instructions on how to take a shower or bath while the port is accessed.  If your port does not need to stay accessed, no bandage is needed over the port.  What is flushing? Flushing helps keep the port from getting clogged. Follow your health care provider's instructions on how and when to flush the port. Ports are usually flushed with saline solution or a medicine called heparin. The need for flushing will depend on how the port is used.  If the port is used for intermittent medicines or blood draws, the port will need to be flushed: ? After medicines have been given. ? After blood has been drawn. ? As part of routine maintenance.  If a constant infusion is running, the port may not need to be flushed.  How long will my port stay implanted? The port can stay in for as long as your health care provider thinks it is needed. When it is time for the port to come out, surgery will be   done to remove it. The procedure is similar to the one performed when the port was put in. When should I seek immediate medical care? When you have an implanted port, you should seek immediate medical care if:  You notice a bad smell coming from the incision site.  You have swelling, redness, or drainage at the incision site.  You have more swelling or pain at the port site or the surrounding area.  You have a fever that is not controlled with medicine.  This information is not intended to replace advice given to you by your health care provider. Make sure you discuss any questions you have with your health care provider. Document  Released: 11/16/2005 Document Revised: 04/23/2016 Document Reviewed: 07/24/2013 Elsevier Interactive Patient Education  2017 [employer].  

## 2017-11-05 ENCOUNTER — Telehealth: Payer: Self-pay | Admitting: Hematology

## 2017-11-05 ENCOUNTER — Encounter: Payer: Self-pay | Admitting: *Deleted

## 2017-11-05 NOTE — Telephone Encounter (Signed)
Patent stopped by scheduling to move 12/27 treatment to 12/26 with lab/fu. No provider availability on day of treatment 12/27. Per patient he does not want appointments separated as he does not want port accessed twice. Moved 12/27 treatment to 12/26. Patient has new date/time.

## 2017-11-05 NOTE — Progress Notes (Signed)
Call on 11-05-17 to let the pt know his medical records are ready to be pick up.

## 2017-11-09 ENCOUNTER — Telehealth: Payer: Self-pay | Admitting: *Deleted

## 2017-11-09 NOTE — Telephone Encounter (Signed)
Pt called regarding an iron infusion appt at [facility] 12/12 and 12/19. Reviewed with Dr. [name], advised pt MD has not ordered any iron for pt. Called [facility] to inquire. They are closed due to weather, lm with answering service to call pt with additional details. No further concerns.

## 2017-11-10 ENCOUNTER — Encounter (HOSPITAL_COMMUNITY): Payer: Medicare Other

## 2017-11-17 ENCOUNTER — Other Ambulatory Visit: Payer: Self-pay | Admitting: Emergency Medicine

## 2017-11-17 ENCOUNTER — Encounter (HOSPITAL_COMMUNITY): Payer: Medicare Other

## 2017-11-24 ENCOUNTER — Encounter: Payer: Self-pay | Admitting: Internal Medicine

## 2017-11-24 ENCOUNTER — Ambulatory Visit (HOSPITAL_BASED_OUTPATIENT_CLINIC_OR_DEPARTMENT_OTHER): Payer: Medicare Other | Admitting: Internal Medicine

## 2017-11-24 ENCOUNTER — Ambulatory Visit (HOSPITAL_BASED_OUTPATIENT_CLINIC_OR_DEPARTMENT_OTHER): Payer: Medicare Other

## 2017-11-24 ENCOUNTER — Ambulatory Visit: Payer: Medicare Other

## 2017-11-24 ENCOUNTER — Ambulatory Visit (HOSPITAL_COMMUNITY)
Admission: RE | Admit: 2017-11-24 | Discharge: 2017-11-24 | Disposition: A | Discharge status: Home / Self Care | Payer: Medicare Other | Source: Ambulatory Visit | Attending: Internal Medicine | Admitting: Internal Medicine

## 2017-11-24 ENCOUNTER — Telehealth: Payer: Self-pay | Admitting: Internal Medicine

## 2017-11-24 ENCOUNTER — Other Ambulatory Visit (HOSPITAL_BASED_OUTPATIENT_CLINIC_OR_DEPARTMENT_OTHER): Payer: Medicare Other

## 2017-11-24 ENCOUNTER — Encounter (HOSPITAL_COMMUNITY): Payer: Self-pay

## 2017-11-24 VITALS — BP 142/74 | HR 72 | Temp 97.6°F | Resp 20 | Ht 70.0 in | Wt 198.3 lb

## 2017-11-24 VITALS — BP 145/83 | HR 70 | Temp 98.3°F | Resp 17

## 2017-11-24 DIAGNOSIS — C3432 Malignant neoplasm of lower lobe, left bronchus or lung: Secondary | ICD-10-CM

## 2017-11-24 DIAGNOSIS — Z7901 Long term (current) use of anticoagulants: Secondary | ICD-10-CM

## 2017-11-24 DIAGNOSIS — K5730 Diverticulosis of large intestine without perforation or abscess without bleeding: Secondary | ICD-10-CM | POA: Insufficient documentation

## 2017-11-24 DIAGNOSIS — C3492 Malignant neoplasm of unspecified part of left bronchus or lung: Secondary | ICD-10-CM

## 2017-11-24 DIAGNOSIS — R5382 Chronic fatigue, unspecified: Secondary | ICD-10-CM

## 2017-11-24 DIAGNOSIS — J432 Centrilobular emphysema: Secondary | ICD-10-CM | POA: Diagnosis not present

## 2017-11-24 DIAGNOSIS — I2699 Other pulmonary embolism without acute cor pulmonale: Secondary | ICD-10-CM | POA: Diagnosis not present

## 2017-11-24 DIAGNOSIS — C3490 Malignant neoplasm of unspecified part of unspecified bronchus or lung: Secondary | ICD-10-CM | POA: Diagnosis not present

## 2017-11-24 DIAGNOSIS — C801 Malignant (primary) neoplasm, unspecified: Secondary | ICD-10-CM

## 2017-11-24 DIAGNOSIS — B370 Candidal stomatitis: Secondary | ICD-10-CM

## 2017-11-24 DIAGNOSIS — I3131 Malignant pericardial effusion in diseases classified elsewhere: Secondary | ICD-10-CM

## 2017-11-24 DIAGNOSIS — K8020 Calculus of gallbladder without cholecystitis without obstruction: Secondary | ICD-10-CM | POA: Diagnosis not present

## 2017-11-24 DIAGNOSIS — K121 Other forms of stomatitis: Secondary | ICD-10-CM

## 2017-11-24 DIAGNOSIS — Z95828 Presence of other vascular implants and grafts: Secondary | ICD-10-CM

## 2017-11-24 DIAGNOSIS — Z5111 Encounter for antineoplastic chemotherapy: Secondary | ICD-10-CM | POA: Diagnosis not present

## 2017-11-24 DIAGNOSIS — I700 Atherosclerosis of aorta: Secondary | ICD-10-CM | POA: Diagnosis not present

## 2017-11-24 DIAGNOSIS — Z5112 Encounter for antineoplastic immunotherapy: Secondary | ICD-10-CM

## 2017-11-24 DIAGNOSIS — J439 Emphysema, unspecified: Secondary | ICD-10-CM | POA: Diagnosis not present

## 2017-11-24 DIAGNOSIS — I10 Essential (primary) hypertension: Secondary | ICD-10-CM | POA: Diagnosis not present

## 2017-11-24 DIAGNOSIS — I2510 Atherosclerotic heart disease of native coronary artery without angina pectoris: Secondary | ICD-10-CM | POA: Diagnosis not present

## 2017-11-24 DIAGNOSIS — I313 Pericardial effusion (noninflammatory): Secondary | ICD-10-CM

## 2017-11-24 LAB — CBC WITH DIFFERENTIAL/PLATELET
BASO%: 0 % (ref 0.0–2.0)
Basophils Absolute: 0 10*3/uL (ref 0.0–0.1)
EOS ABS: 0 10*3/uL (ref 0.0–0.5)
EOS%: 0 % (ref 0.0–7.0)
HCT: 43 % (ref 38.4–49.9)
HGB: 13.4 g/dL (ref 13.0–17.1)
LYMPH%: 4.1 % — AB (ref 14.0–49.0)
MCH: 33.5 pg — ABNORMAL HIGH (ref 27.2–33.4)
MCHC: 31.2 g/dL — ABNORMAL LOW (ref 32.0–36.0)
MCV: 107.5 fL — AB (ref 79.3–98.0)
MONO#: 0.5 10*3/uL (ref 0.1–0.9)
MONO%: 3.9 % (ref 0.0–14.0)
NEUT%: 92 % — ABNORMAL HIGH (ref 39.0–75.0)
NEUTROS ABS: 11.6 10*3/uL — AB (ref 1.5–6.5)
PLATELETS: 125 10*3/uL — AB (ref 140–400)
RBC: 4 10*6/uL — AB (ref 4.20–5.82)
RDW: 15.9 % — ABNORMAL HIGH (ref 11.0–14.6)
WBC: 12.6 10*3/uL — AB (ref 4.0–10.3)
lymph#: 0.5 10*3/uL — ABNORMAL LOW (ref 0.9–3.3)

## 2017-11-24 LAB — COMPREHENSIVE METABOLIC PANEL
ALT: 35 U/L (ref 0–55)
AST: 29 U/L (ref 5–34)
Albumin: 3.5 g/dL (ref 3.5–5.0)
Alkaline Phosphatase: 78 U/L (ref 40–150)
Anion Gap: 10 mEq/L (ref 3–11)
BUN: 17 mg/dL (ref 7.0–26.0)
CHLORIDE: 106 meq/L (ref 98–109)
CO2: 22 mEq/L (ref 22–29)
Calcium: 8.9 mg/dL (ref 8.4–10.4)
Creatinine: 1 mg/dL (ref 0.7–1.3)
EGFR: >60 mL/min/{1.73_m2} (ref >60)
GLUCOSE: 110 mg/dL (ref 70–140)
POTASSIUM: 4.4 meq/L (ref 3.5–5.1)
SODIUM: 139 meq/L (ref 136–145)
Total Bilirubin: 0.37 mg/dL (ref 0.20–1.20)
Total Protein: 7.3 g/dL (ref 6.4–8.3)

## 2017-11-24 LAB — UA PROTEIN, DIPSTICK - CHCC: PROTEIN: 30 mg/dL

## 2017-11-24 LAB — RESEARCH LABS

## 2017-11-24 MED ORDER — SODIUM CHLORIDE 0.9% FLUSH
10.0000 mL | INTRAVENOUS | Status: DC | PRN
  Administered 2017-11-24: 10 mL
  Filled 2017-11-24: qty 10

## 2017-11-24 MED ORDER — SODIUM CHLORIDE 0.9% FLUSH
10.0000 mL | INTRAVENOUS | Status: DC | PRN
  Administered 2017-11-24: 10 mL via INTRAVENOUS
  Filled 2017-11-24: qty 10

## 2017-11-24 MED ORDER — SODIUM CHLORIDE 0.9 % IV SOLN
75.0000 mg/m2 | Freq: Once | INTRAVENOUS | Status: AC
  Administered 2017-11-24: 160 mg via INTRAVENOUS
  Filled 2017-11-24: qty 16

## 2017-11-24 MED ORDER — HEPARIN SOD (PORK) LOCK FLUSH 100 UNIT/ML IV SOLN
500.0000 [IU] | Freq: Once | INTRAVENOUS | Status: AC | PRN
  Administered 2017-11-24: 500 [IU]
  Filled 2017-11-24: qty 5

## 2017-11-24 MED ORDER — ACETAMINOPHEN 325 MG PO TABS
ORAL_TABLET | ORAL | Status: AC
  Filled 2017-11-24: qty 2

## 2017-11-24 MED ORDER — DEXAMETHASONE SODIUM PHOSPHATE 10 MG/ML IJ SOLN
INTRAMUSCULAR | Status: AC
  Filled 2017-11-24: qty 1

## 2017-11-24 MED ORDER — SODIUM CHLORIDE 0.9 % IV SOLN
Freq: Once | INTRAVENOUS | Status: AC
  Administered 2017-11-24: 11:00:00 via INTRAVENOUS

## 2017-11-24 MED ORDER — DIPHENHYDRAMINE HCL 50 MG/ML IJ SOLN
INTRAMUSCULAR | Status: AC
  Filled 2017-11-24: qty 1

## 2017-11-24 MED ORDER — SODIUM CHLORIDE 0.9 % IV SOLN
10.0000 mg/kg | Freq: Once | INTRAVENOUS | Status: AC
  Administered 2017-11-24: 900 mg via INTRAVENOUS
  Filled 2017-11-24: qty 50

## 2017-11-24 MED ORDER — DIPHENHYDRAMINE HCL 50 MG/ML IJ SOLN
50.0000 mg | Freq: Once | INTRAMUSCULAR | Status: AC
  Administered 2017-11-24: 50 mg via INTRAVENOUS

## 2017-11-24 MED ORDER — IOPAMIDOL (ISOVUE-300) INJECTION 61%
INTRAVENOUS | Status: AC
  Filled 2017-11-24: qty 100

## 2017-11-24 MED ORDER — MAGIC MOUTHWASH: 5.0000 mL | Freq: Four times a day (QID) | ORAL | 1 refills | Status: DC

## 2017-11-24 MED ORDER — DEXAMETHASONE SODIUM PHOSPHATE 10 MG/ML IJ SOLN
10.0000 mg | Freq: Once | INTRAMUSCULAR | Status: AC
  Administered 2017-11-24: 10 mg via INTRAVENOUS

## 2017-11-24 MED ORDER — IOPAMIDOL (ISOVUE-300) INJECTION 61%
100.0000 mL | Freq: Once | INTRAVENOUS | Status: AC | PRN
  Administered 2017-11-24: 100 mL via INTRAVENOUS

## 2017-11-24 MED ORDER — PROCHLORPERAZINE MALEATE 10 MG PO TABS: 10.0000 mg | ORAL_TABLET | Freq: Four times a day (QID) | ORAL | 0 refills | Status: AC | PRN

## 2017-11-24 MED ORDER — ACETAMINOPHEN 325 MG PO TABS
650.0000 mg | ORAL_TABLET | Freq: Once | ORAL | Status: AC
Start: 2017-11-24 — End: 2017-11-24
  Administered 2017-11-24: 650 mg via ORAL

## 2017-11-24 NOTE — Patient Instructions (Signed)
[facility] Discharge Instructions for Patients Receiving Chemotherapy  Today you received the following chemotherapy agents: Taxotere (Docetaxel) & Cyramza (Ramucirumab)  To help prevent nausea and vomiting after your treatment, we encourage you to take your nausea medication as prescribed.  If you develop nausea and vomiting that is not controlled by your nausea medication, call the clinic.   BELOW ARE SYMPTOMS THAT SHOULD BE REPORTED IMMEDIATELY:  *FEVER GREATER THAN 100.5 F  *CHILLS WITH OR WITHOUT FEVER  NAUSEA AND VOMITING THAT IS NOT CONTROLLED WITH YOUR NAUSEA MEDICATION  *UNUSUAL SHORTNESS OF BREATH  *UNUSUAL BRUISING OR BLEEDING  TENDERNESS IN MOUTH AND THROAT WITH OR WITHOUT PRESENCE OF ULCERS  *URINARY PROBLEMS  *BOWEL PROBLEMS  UNUSUAL RASH Items with * indicate a potential emergency and should be followed up as soon as possible.  Feel free to call the clinic should you have any questions or concerns. The clinic phone number is [phone].  Please show the [facility] at check-in to the Emergency Department and triage nurse.     Docetaxel injection What is this medicine? DOCETAXEL (doe se TAX el) is a chemotherapy drug. It targets fast dividing cells, like cancer cells, and causes these cells to die. This medicine is used to treat many types of cancers like breast cancer, certain stomach cancers, head and neck cancer, lung cancer, and prostate cancer. This medicine may be used for other purposes; ask your health care provider or pharmacist if you have questions. COMMON BRAND NAME(S): Docefrez, Taxotere What should I tell my health care provider before I take this medicine? They need to know if you have any of these conditions: -infection (especially a virus infection such as chickenpox, cold sores, or herpes) -liver disease -low blood counts, like low white cell, platelet, or red cell counts -an unusual or allergic reaction to  docetaxel, polysorbate 80, other chemotherapy agents, other medicines, foods, dyes, or preservatives -pregnant or trying to get pregnant -breast-feeding How should I use this medicine? This drug is given as an infusion into a vein. It is administered in a hospital or clinic by a specially trained health care professional. Talk to your pediatrician regarding the use of this medicine in children. Special care may be needed. Overdosage: If you think you have taken too much of this medicine contact a poison control center or emergency room at once. NOTE: This medicine is only for you. Do not share this medicine with others. What if I miss a dose? It is important not to miss your dose. Call your doctor or health care professional if you are unable to keep an appointment. What may interact with this medicine? -cyclosporine -erythromycin -ketoconazole -medicines to increase blood counts like filgrastim, pegfilgrastim, sargramostim -vaccines Talk to your doctor or health care professional before taking any of these medicines: -acetaminophen -aspirin -ibuprofen -ketoprofen -naproxen This list may not describe all possible interactions. Give your health care provider a list of all the medicines, herbs, non-prescription drugs, or dietary supplements you use. Also tell them if you smoke, drink alcohol, or use illegal drugs. Some items may interact with your medicine. What should I watch for while using this medicine? Your condition will be monitored carefully while you are receiving this medicine. You will need important blood work done while you are taking this medicine. This drug may make you feel generally unwell. This is not uncommon, as chemotherapy can affect healthy cells as well as cancer cells. Report any side effects. Continue your course of treatment  even though you feel ill unless your doctor tells you to stop. In some cases, you may be given additional medicines to help with side effects.  Follow all directions for their use. Call your doctor or health care professional for advice if you get a fever, chills or sore throat, or other symptoms of a cold or flu. Do not treat yourself. This drug decreases your body's ability to fight infections. Try to avoid being around people who are sick. This medicine may increase your risk to bruise or bleed. Call your doctor or health care professional if you notice any unusual bleeding. This medicine may contain alcohol in the product. You may get drowsy or dizzy. Do not drive, use machinery, or do anything that needs mental alertness until you know how this medicine affects you. Do not stand or sit up quickly, especially if you are an older patient. This reduces the risk of dizzy or fainting spells. Avoid alcoholic drinks. Do not become pregnant while taking this medicine. Women should inform their doctor if they wish to become pregnant or think they might be pregnant. There is a potential for serious side effects to an unborn child. Talk to your health care professional or pharmacist for more information. Do not breast-feed an infant while taking this medicine. What side effects may I notice from receiving this medicine? Side effects that you should report to your doctor or health care professional as soon as possible: -allergic reactions like skin rash, itching or hives, swelling of the face, lips, or tongue -low blood counts - This drug may decrease the number of white blood cells, red blood cells and platelets. You may be at increased risk for infections and bleeding. -signs of infection - fever or chills, cough, sore throat, pain or difficulty passing urine -signs of decreased platelets or bleeding - bruising, pinpoint red spots on the skin, black, tarry stools, nosebleeds -signs of decreased red blood cells - unusually weak or tired, fainting spells, lightheadedness -breathing problems -fast or irregular heartbeat -low blood pressure -mouth  sores -nausea and vomiting -pain, swelling, redness or irritation at the injection site -pain, tingling, numbness in the hands or feet -swelling of the ankle, feet, hands -weight gain Side effects that usually do not require medical attention (report to your doctor or health care professional if they continue or are bothersome): -bone pain -complete hair loss including hair on your head, underarms, pubic hair, eyebrows, and eyelashes -diarrhea -excessive tearing -changes in the color of fingernails -loosening of the fingernails -nausea -muscle pain -red flush to skin -sweating -weak or tired This list may not describe all possible side effects. Call your doctor for medical advice about side effects. You may report side effects to FDA at 1-800-FDA-1088. Where should I keep my medicine? This drug is given in a hospital or clinic and will not be stored at home. NOTE: This sheet is a summary. It may not cover all possible information. If you have questions about this medicine, talk to your doctor, pharmacist, or health care provider.  2018 [employer] Standard (2015-12-19 12:32:56)    Ramucirumab injection What is this medicine? RAMUCIRUMAB (ra mue SIR ue mab) is a monoclonal antibody. It is used to treat stomach cancer, colorectal cancer, or lung cancer. This medicine may be used for other purposes; ask your health care provider or pharmacist if you have questions. COMMON BRAND NAME(S): Cyramza What should I tell my health care provider before I take this medicine? They need to know if you have  any of these conditions: -bleeding disorders -blood clots -heart disease, including heart failure, heart attack, or chest pain (angina) -high blood pressure -infection (especially a virus infection such as chickenpox, cold sores, or herpes) -protein in your urine -recent surgery -stroke -an unusual or allergic reaction to ramucirumab, other medicines, foods, dyes, or  preservatives -pregnant or trying to get pregnant -breast-feeding How should I use this medicine? This medicine is for infusion into a vein. It is given by a health care professional in a hospital or clinic setting. Talk to your pediatrician regarding the use of this medicine in children. Special care may be needed. Overdosage: If you think you have taken too much of this medicine contact a poison control center or emergency room at once. NOTE: This medicine is only for you. Do not share this medicine with others. What if I miss a dose? It is important not to miss your dose. Call your doctor or health care professional if you are unable to keep an appointment. What may interact with this medicine? Interactions have not been studied. This list may not describe all possible interactions. Give your health care provider a list of all the medicines, herbs, non-prescription drugs, or dietary supplements you use. Also tell them if you smoke, drink alcohol, or use illegal drugs. Some items may interact with your medicine. What should I watch for while using this medicine? Your condition will be monitored carefully while you are receiving this medicine. You will need to to check your blood pressure and have your blood and urine tested while you are taking this medicine. Your condition will be monitored carefully while you are receiving this medicine. This medicine may increase your risk to bruise or bleed. Call your doctor or health care professional if you notice any unusual bleeding. This medicine may rarely cause 'gastrointestinal perforation' (holes in the stomach, intestines or colon), a serious side effect requiring surgery to repair. This medicine should be started at least 28 days following major surgery and the site of the surgery should be totally healed. Check with your doctor before scheduling dental work or surgery while you are receiving this treatment. Talk to your doctor if you have recently  had surgery or if you have a wound that has not healed. Do not become pregnant while taking this medicine or for 3 months after stopping it. Women should inform their doctor if they wish to become pregnant or think they might be pregnant. There is a potential for serious side effects to an unborn child. Talk to your health care professional or pharmacist for more information. What side effects may I notice from receiving this medicine? Side effects that you should report to your doctor or health care professional as soon as possible: -allergic reactions like skin rash, itching or hives, breathing problems, swelling of the face, lips, or tongue -signs of infection - fever or chills, cough, sore throat -chest pain or chest tightness -confusion -dizziness -feeling faint or lightheaded, falls -severe abdominal pain -severe nausea, vomiting -signs and symptoms of bleeding such as bloody or black, tarry stools; red or dark-brown urine; spitting up blood or brown material that looks like coffee grounds; red spots on the skin; unusual bruising or bleeding from the eye, gums, or nose -signs and symptoms of a blood clot such as breathing problems; changes in vision; chest pain; severe, sudden headache; pain, swelling, warmth in the leg; trouble speaking; sudden numbness or weakness of the face, arm or leg -symptoms of a stroke: change in  mental awareness, inability to talk or move one side of the body -trouble walking, dizziness, loss of balance or coordination Side effects that usually do not require medical attention (report to your doctor or health care professional if they continue or are bothersome): -cold, clammy skin -constipation -diarrhea -headache -nausea, vomiting -stomach pain -unusually slow heartbeat -unusually weak or tired This list may not describe all possible side effects. Call your doctor for medical advice about side effects. You may report side effects to FDA at  1-800-FDA-1088. Where should I keep my medicine? This drug is given in a hospital or clinic and will not be stored at home. NOTE: This sheet is a summary. It may not cover all possible information. If you have questions about this medicine, talk to your doctor, pharmacist, or health care provider.  2018 [employer] Standard (2015-12-19 08:20:29)

## 2017-11-24 NOTE — Telephone Encounter (Signed)
Scheduled appt per 12/26 los - Patient to get an updated schedule in the treatment area.

## 2017-11-24 NOTE — Patient Instructions (Signed)
Implanted Port Home Guide An implanted port is a type of central line that is placed under the skin. Central lines are used to provide IV access when treatment or nutrition needs to be given through a person's veins. Implanted ports are used for long-term IV access. An implanted port may be placed because:  You need IV medicine that would be irritating to the small veins in your hands or arms.  You need long-term IV medicines, such as antibiotics.  You need IV nutrition for a long period.  You need frequent blood draws for lab tests.  You need dialysis.  Implanted ports are usually placed in the chest area, but they can also be placed in the upper arm, the abdomen, or the leg. An implanted port has two main parts:  Reservoir. The reservoir is round and will appear as a small, raised area under your skin. The reservoir is the part where a needle is inserted to give medicines or draw blood.  Catheter. The catheter is a thin, flexible tube that extends from the reservoir. The catheter is placed into a large vein. Medicine that is inserted into the reservoir goes into the catheter and then into the vein.  How will I care for my incision site? Do not get the incision site wet. Bathe or shower as directed by your health care provider. How is my port accessed? Special steps must be taken to access the port:  Before the port is accessed, a numbing cream can be placed on the skin. This helps numb the skin over the port site.  Your health care provider uses a sterile technique to access the port. ? Your health care provider must put on a mask and sterile gloves. ? The skin over your port is cleaned carefully with an antiseptic and allowed to dry. ? The port is gently pinched between sterile gloves, and a needle is inserted into the port.  Only "non-coring" port needles should be used to access the port. Once the port is accessed, a blood return should be checked. This helps ensure that the port  is in the vein and is not clogged.  If your port needs to remain accessed for a constant infusion, a clear (transparent) bandage will be placed over the needle site. The bandage and needle will need to be changed every week, or as directed by your health care provider.  Keep the bandage covering the needle clean and dry. Do not get it wet. Follow your health care provider's instructions on how to take a shower or bath while the port is accessed.  If your port does not need to stay accessed, no bandage is needed over the port.  What is flushing? Flushing helps keep the port from getting clogged. Follow your health care provider's instructions on how and when to flush the port. Ports are usually flushed with saline solution or a medicine called heparin. The need for flushing will depend on how the port is used.  If the port is used for intermittent medicines or blood draws, the port will need to be flushed: ? After medicines have been given. ? After blood has been drawn. ? As part of routine maintenance.  If a constant infusion is running, the port may not need to be flushed.  How long will my port stay implanted? The port can stay in for as long as your health care provider thinks it is needed. When it is time for the port to come out, surgery will be   done to remove it. The procedure is similar to the one performed when the port was put in. When should I seek immediate medical care? When you have an implanted port, you should seek immediate medical care if:  You notice a bad smell coming from the incision site.  You have swelling, redness, or drainage at the incision site.  You have more swelling or pain at the port site or the surrounding area.  You have a fever that is not controlled with medicine.  This information is not intended to replace advice given to you by your health care provider. Make sure you discuss any questions you have with your health care provider. Document  Released: 11/16/2005 Document Revised: 04/23/2016 Document Reviewed: 07/24/2013 Elsevier Interactive Patient Education  2017 [employer].  

## 2017-11-24 NOTE — Progress Notes (Signed)
[place] [phone]   [phone]  OFFICE PROGRESS NOTE  Rankins, [name], MD [place] [address]  DIAGNOSIS: Stage IV (T1a, N3, M1b) non-small cell lung cancer, adenocarcinoma with PDL 1 expression of 80%. 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  PRIOR THERAPY:  1) Treatment with immunotherapy with [name] (pembrolizumab) 200 MG IV every 3 weeks status post 6 cycles discontinued secondary to disease progression. 2) Systemic chemotherapy with carboplatin for AUC of 5, Alimta 500 MG/M2 and Avastin 15 MG/KG every 3 weeks. First dose 02/24/2017. Status post 6 cycles. Last dose was given 06/09/2017. 3) Maintenance treatment with Alimta 500 MG/M2 and Avastin 15 MG/KG every 3 weeks. First dose 07/21/2017. Status post 3 cycles. His discontinued secondary to disease progression. 4) palliative radiotherapy to the progressive left hilar soft tissue mass under the care of Dr. Sondra Come completed October 25, 2017.  CURRENT THERAPY: Systemic chemotherapy with docetaxel 75 mg/M2 and Cyramza 10 mg/KG every 3 weeks.  First dose November 25, 2017.  INTERVAL HISTORY: PENG THORSTENSON 71 y.o. male returns to the clinic today for follow-up visit accompanied by his wife.  The patient is feeling fine today with no specific complaints.  He denied having any chest pain, shortness of breath except with exertion, cough or hemoptysis.  He denied having any fever or chills.  He has no nausea, vomiting, diarrhea or constipation.  The patient denied having any significant weight loss or night sweats.  He continues to have mouth sores from his dental procedure. He had a repeat CT scan of the chest, abdomen and pelvis performed earlier today and he is here for evaluation and  discussion of his discuss results before starting the first dose of his treatment with chemotherapy.  MEDICAL HISTORY: Past Medical History:  Diagnosis Date  . Abdominal aortic aneurysm ([place])   . Arthritis    "hips; lower spine" (05/24/2014)  . Chemotherapy induced neutropenia ([place]) 03/31/2017  . Chronic fatigue 10/05/2016  . Encounter for antineoplastic chemotherapy 02/17/2017  . Hyperlipidemia   . Hypertension   . Lung cancer ([place]) 09/25/2016  . PE (pulmonary embolism) 9/15  . Pericardial effusion 09/20/2016   Malignant effusion s/p pericardial drain  . Personal history of DVT (deep vein thrombosis) 9/15    ALLERGIES:  has No Known Allergies.  MEDICATIONS:  Current Outpatient Medications  Medication Sig Dispense Refill  . albuterol (PROVENTIL HFA;VENTOLIN HFA) 108 (90 Base) MCG/ACT inhaler Inhale 2 puffs into the lungs every 4 (four) hours as needed for wheezing or shortness of breath. 1 Inhaler 5  . atorvastatin (LIPITOR) 20 MG tablet TAKE 1 TABLET BY MOUTH EVERY DAY AT 6PM 90 tablet 2  . dexamethasone (DECADRON) 4 MG tablet 4 mg by mouth twice a day the day before, day of and day after the chemotherapy every 3 weeks. 40 tablet 1  . lidocaine-prilocaine (EMLA) cream Apply 1 application topically as needed (on port site).     Marland Kitchen oxyCODONE-acetaminophen (PERCOCET/ROXICET) 5-325 MG tablet Take 1 tablet by mouth every 4 (four) hours as needed for severe pain. 30 tablet 0  . XARELTO 20 MG TABS tablet TAKE 1 TABLET BY MOUTH EVERY DAY 30 tablet 5  . chlorpheniramine-HYDROcodone (TUSSIONEX) 10-8 MG/5ML SUER Take 5 mLs at bedtime as needed by mouth for cough. (Patient not taking: Reported on 11/24/2017)  115 mL 0  . dextromethorphan-guaiFENesin (MUCINEX DM) 30-600 MG 12hr tablet Take 2 tablets by mouth 2 (two) times daily.    . magic mouthwash SOLN Take 5 mLs by mouth 4 (four) times daily. Prednisone 80 mg , Nystatin 30 ml, Benadryl 12.5 mg/5 ml. Mix to 240 ml, 1:1:1 concentration. (Patient not  taking: Reported on 11/24/2017) 240 mL 1  . prochlorperazine (COMPAZINE) 10 MG tablet Take 1 tablet (10 mg total) by mouth every 6 (six) hours as needed for nausea or vomiting. (Patient not taking: Reported on 11/24/2017) 30 tablet 0  . sucralfate (CARAFATE) 1 g tablet Take 1 tablet (1 g total) by mouth 4 (four) times daily -  with meals and at bedtime. Crush and dissolve in 10 cc of water prior to swallowing (Patient not taking: Reported on 11/24/2017) 30 tablet 1   No current facility-administered medications for this visit.    Facility-Administered Medications Ordered in Other Visits  Medication Dose Route Frequency Provider Last Rate Last Dose  . heparin lock flush 100 unit/mL  500 Units Intravenous Once PRN [name], MD      . iopamidol (ISOVUE-300) 61 % injection           . sodium chloride flush (NS) 0.9 % injection 10 mL  10 mL Intravenous PRN [name], MD   10 mL at 08/11/17 0940  . sodium chloride flush (NS) 0.9 % injection 10 mL  10 mL Intravenous PRN [name], MD        SURGICAL HISTORY:  Past Surgical History:  Procedure Laterality Date  . ANTERIOR FUSION CERVICAL SPINE  ~ 2005  . APPENDECTOMY  1978  . CARDIAC CATHETERIZATION N/A 09/21/2016   Procedure: Pericardiocentesis;  Surgeon: Jettie Booze, MD;  Location: [facility];  Service: Cardiovascular;  Laterality: N/A;  . COLONOSCOPY    . HEMIARTHROPLASTY SHOULDER FRACTURE Left 12/2008  . INGUINAL HERNIA REPAIR Bilateral 1992  . IR GENERIC HISTORICAL  02/01/2017   IR FLUORO GUIDE PORT INSERTION RIGHT 02/01/2017 Arne Cleveland, MD [facility]  . IR GENERIC HISTORICAL  02/01/2017   IR US GUIDE VASC ACCESS RIGHT 02/01/2017 Arne Cleveland, MD [facility]  . JOINT REPLACEMENT    . PERICARDIAL FLUID DRAINAGE  09/21/2016  . TOTAL HIP ARTHROPLASTY Left 05/23/2014   Procedure: TOTAL HIP ARTHROPLASTY;  Surgeon: Kerin Salen, MD;  Location: [facility];  Service: Orthopedics;  Laterality: Left;  . TOTAL HIP  ARTHROPLASTY Right 05/15/2015   Procedure: TOTAL HIP ARTHROPLASTY;  Surgeon: Frederik Pear, MD;  Location: [facility];  Service: Orthopedics;  Laterality: Right;    REVIEW OF SYSTEMS:  Constitutional: positive for fatigue Eyes: negative Ears, nose, mouth, throat, and face: negative Respiratory: negative Cardiovascular: negative Gastrointestinal: negative Genitourinary:negative Integument/breast: negative Hematologic/lymphatic: negative Musculoskeletal:negative Neurological: negative Behavioral/Psych: negative Endocrine: negative Allergic/Immunologic: negative   PHYSICAL EXAMINATION: General appearance: alert, cooperative, fatigued and no distress Head: Normocephalic, without obvious abnormality, atraumatic Neck: no adenopathy, no JVD, supple, symmetrical, trachea midline and thyroid not enlarged, symmetric, no tenderness/mass/nodules Lymph nodes: Cervical, supraclavicular, and axillary nodes normal. Resp: clear to auscultation bilaterally Back: symmetric, no curvature. ROM normal. No CVA tenderness. Cardio: regular rate and rhythm, S1, S2 normal, no murmur, click, rub or gallop GI: soft, non-tender; bowel sounds normal; no masses,  no organomegaly Extremities: extremities normal, atraumatic, no cyanosis or edema Neurologic: Alert and oriented X 3, normal strength and tone. Normal symmetric reflexes. Normal coordination and gait  ECOG PERFORMANCE STATUS: 1 - Symptomatic but completely ambulatory  Blood pressure (!) 142/74, pulse 72, temperature 97.6 F (36.4 C), temperature source Oral, resp. rate 20, height 5\' 10"  (1.778 m), weight 198 lb 4.8 oz (89.9 kg), SpO2 99 %.  LABORATORY DATA: Lab Results  Component Value Date   WBC 12.6 (H) 11/24/2017   HGB 13.4 11/24/2017   HCT 43.0 11/24/2017   MCV 107.5 (H) 11/24/2017   PLT 125 (L) 11/24/2017      Chemistry      Component Value Date/Time   NA 139 11/24/2017 0910   K 4.4 11/24/2017 0910   CL 106 08/21/2017 0926   CO2 22  11/24/2017 0910   BUN 17.0 11/24/2017 0910   CREATININE 1.0 11/24/2017 0910      Component Value Date/Time   CALCIUM 8.9 11/24/2017 0910   ALKPHOS 78 11/24/2017 0910   AST 29 11/24/2017 0910   ALT 35 11/24/2017 0910   BILITOT 0.37 11/24/2017 0910       RADIOGRAPHIC STUDIES: Ct Chest W Contrast  Result Date: 11/24/2017 CLINICAL DATA:  71 year old male with history of lung cancer diagnosed in October 2017. Treated with immunotherapy. Previously treated with radiation therapy. EXAM: CT CHEST, ABDOMEN, AND PELVIS WITH CONTRAST TECHNIQUE: Multidetector CT imaging of the chest, abdomen and pelvis was performed following the standard protocol during bolus administration of intravenous contrast. CONTRAST:  134mL ISOVUE-300 IOPAMIDOL (ISOVUE-300) INJECTION 61% COMPARISON:  CT of the chest, abdomen and pelvis 09/20/2017. FINDINGS: CT CHEST FINDINGS Cardiovascular: Heart size is normal. There is no significant pericardial fluid, thickening or pericardial calcification. There is aortic atherosclerosis, as well as atherosclerosis of the great vessels of the mediastinum and the coronary arteries, including calcified atherosclerotic plaque in the left anterior descending and right coronary arteries. Mediastinum/Nodes: Compared to the prior examination, lymphadenopathy in the mediastinum has returned, with the largest mediastinal lymph node in the prevascular nodal station measuring up to 14 mm in short axis (axial image 21 of series 2), previously only 4 mm in short axis in the same position on 09/20/2017. 11 mm short axis superior mediastinal node just posterior to the proximal left subclavian artery (axial image 15 of series 2) increased compared to the prior examination. Several other prominent but nonenlarged prevascular lymph nodes are also noted. By comparison, confluence of enlarged lymph nodes in the left hilar region seen on the prior study has largely resolved, with the largest residual node in this  region measuring only 9 mm in short axis (axial image 38 series 2). Esophagus is unremarkable in appearance. No axillary lymphadenopathy. Lungs/Pleura: Previously noted small nodule in the periphery of the left lower lobe has decreased in size, currently measuring 14 x 9 mm (axial image 98 of series 4) is as compared with 17 x 11 mm on the prior study. New nodularity noted along the inferior aspect of the left major fissure, with what appears to be some new consolidative opacity in the dependent portion of the left upper lobe abutting the major fissure, of uncertain etiology and significance. Diffuse bronchial wall thickening with mild to moderate centrilobular and paraseptal emphysema. Scattered areas of mild septal thickening, similar to the prior examination. No pleural effusions. Musculoskeletal: Orthopedic fixation hardware in the lower cervical spine incidentally noted. Orthopedic fixation hardware in left proximal humerus incompletely imaged. There are no aggressive appearing lytic or blastic lesions noted in the visualized portions of the skeleton. CT ABDOMEN PELVIS FINDINGS Hepatobiliary: 13 mm intermediate attenuation (31 HU) lesion in segment 4A of the liver is slightly smaller than prior studies, favored  to represent an involuting cyst. No other suspicious appearing hepatic lesions. No intra or extrahepatic biliary ductal dilatation. 2 mm calcified gallstone in the neck of the gallbladder. No findings to suggest an acute cholecystitis at this time. Pancreas: No pancreatic mass. No pancreatic ductal dilatation. No pancreatic or peripancreatic fluid or inflammatory changes. Spleen: Unremarkable. Adrenals/Urinary Tract: Multiple low-attenuation lesions in the right kidney, compatible with simple cysts, the largest of which is exophytic extending off the anterior aspect of the lower pole measuring 11.4 cm. Several other subcentimeter low-attenuation lesions in both kidneys are too small to definitively  characterize, but are favored to represent tiny cysts. Bilateral adrenal glands are normal in appearance. No hydroureteronephrosis. Urinary bladder is largely obscured by beam hardening artifact from bilateral hip arthroplasties. Stomach/Bowel: Normal appearance of the stomach. No pathologic dilatation of small bowel or colon. Numerous colonic diverticulae are noted, without surrounding inflammatory changes to suggest an acute diverticulitis at this time. The appendix is not confidently identified and may be surgically absent. Regardless, there are no inflammatory changes noted adjacent to the cecum to suggest the presence of an acute appendicitis at this time. Vascular/Lymphatic: Extensive aortic atherosclerosis with fusiform aneurysmal dilatation of the infrarenal abdominal aorta which measures up to 3.8 x 3.3 cm. 10 mm aortocaval lymph node (axial image 78 of series 2) is unchanged and nonspecific. No other lymphadenopathy noted elsewhere in the abdomen or pelvis. Reproductive: Prostate gland and seminal vesicles are unremarkable in appearance. Other: No significant volume of ascites.  No pneumoperitoneum. Musculoskeletal: There are no aggressive appearing lytic or blastic lesions noted in the visualized portions of the skeleton. IMPRESSION: 1. Today's study demonstrates a mixed response to therapy. Specifically, the recurrence in the left hilar region appears significantly improved when compared to the prior examination, however, there is progressive nodal disease in the anterior and superior mediastinum, and marked worsening official nodularity along the left major fissure which raises concern for developing lymphangitic spread of disease. 2. No evidence of metastatic disease to the abdomen or pelvis. 3. Aortic atherosclerosis, in addition to 2 vessel coronary artery disease. Assessment for potential risk factor modification, dietary therapy or pharmacologic therapy may be warranted, if clinically indicated. 4.  In addition, there is fusiform aneurysmal dilatation of the infrarenal abdominal aorta which measures up to 3.8 x 3.3 cm, similar to the prior study. Recommend followup by ultrasound in 2 years. This recommendation follows ACR consensus guidelines: White Paper of the ACR Incidental Findings Committee II on Vascular Findings. J Am Coll Radiol 2013; 10:789-794. 5. Colonic diverticulosis without evidence of acute diverticulitis at this time. 6. Cholelithiasis without evidence of acute cholecystitis. 7. Diffuse bronchial wall thickening with mild to moderate centrilobular and paraseptal emphysema; imaging findings suggestive of underlying COPD. 8. Additional incidental findings, as above. Aortic Atherosclerosis (ICD10-I70.0) and Emphysema (ICD10-J43.9). Electronically Signed   By: Vinnie Langton M.D.   On: 11/24/2017 09:34   Ct Abdomen Pelvis W Contrast  Result Date: 11/24/2017 CLINICAL DATA:  71 year old male with history of lung cancer diagnosed in October 2017. Treated with immunotherapy. Previously treated with radiation therapy. EXAM: CT CHEST, ABDOMEN, AND PELVIS WITH CONTRAST TECHNIQUE: Multidetector CT imaging of the chest, abdomen and pelvis was performed following the standard protocol during bolus administration of intravenous contrast. CONTRAST:  154mL ISOVUE-300 IOPAMIDOL (ISOVUE-300) INJECTION 61% COMPARISON:  CT of the chest, abdomen and pelvis 09/20/2017. FINDINGS: CT CHEST FINDINGS Cardiovascular: Heart size is normal. There is no significant pericardial fluid, thickening or pericardial calcification. There is aortic atherosclerosis,  as well as atherosclerosis of the great vessels of the mediastinum and the coronary arteries, including calcified atherosclerotic plaque in the left anterior descending and right coronary arteries. Mediastinum/Nodes: Compared to the prior examination, lymphadenopathy in the mediastinum has returned, with the largest mediastinal lymph node in the prevascular nodal  station measuring up to 14 mm in short axis (axial image 21 of series 2), previously only 4 mm in short axis in the same position on 09/20/2017. 11 mm short axis superior mediastinal node just posterior to the proximal left subclavian artery (axial image 15 of series 2) increased compared to the prior examination. Several other prominent but nonenlarged prevascular lymph nodes are also noted. By comparison, confluence of enlarged lymph nodes in the left hilar region seen on the prior study has largely resolved, with the largest residual node in this region measuring only 9 mm in short axis (axial image 38 series 2). Esophagus is unremarkable in appearance. No axillary lymphadenopathy. Lungs/Pleura: Previously noted small nodule in the periphery of the left lower lobe has decreased in size, currently measuring 14 x 9 mm (axial image 98 of series 4) is as compared with 17 x 11 mm on the prior study. New nodularity noted along the inferior aspect of the left major fissure, with what appears to be some new consolidative opacity in the dependent portion of the left upper lobe abutting the major fissure, of uncertain etiology and significance. Diffuse bronchial wall thickening with mild to moderate centrilobular and paraseptal emphysema. Scattered areas of mild septal thickening, similar to the prior examination. No pleural effusions. Musculoskeletal: Orthopedic fixation hardware in the lower cervical spine incidentally noted. Orthopedic fixation hardware in left proximal humerus incompletely imaged. There are no aggressive appearing lytic or blastic lesions noted in the visualized portions of the skeleton. CT ABDOMEN PELVIS FINDINGS Hepatobiliary: 13 mm intermediate attenuation (31 HU) lesion in segment 4A of the liver is slightly smaller than prior studies, favored to represent an involuting cyst. No other suspicious appearing hepatic lesions. No intra or extrahepatic biliary ductal dilatation. 2 mm calcified gallstone  in the neck of the gallbladder. No findings to suggest an acute cholecystitis at this time. Pancreas: No pancreatic mass. No pancreatic ductal dilatation. No pancreatic or peripancreatic fluid or inflammatory changes. Spleen: Unremarkable. Adrenals/Urinary Tract: Multiple low-attenuation lesions in the right kidney, compatible with simple cysts, the largest of which is exophytic extending off the anterior aspect of the lower pole measuring 11.4 cm. Several other subcentimeter low-attenuation lesions in both kidneys are too small to definitively characterize, but are favored to represent tiny cysts. Bilateral adrenal glands are normal in appearance. No hydroureteronephrosis. Urinary bladder is largely obscured by beam hardening artifact from bilateral hip arthroplasties. Stomach/Bowel: Normal appearance of the stomach. No pathologic dilatation of small bowel or colon. Numerous colonic diverticulae are noted, without surrounding inflammatory changes to suggest an acute diverticulitis at this time. The appendix is not confidently identified and may be surgically absent. Regardless, there are no inflammatory changes noted adjacent to the cecum to suggest the presence of an acute appendicitis at this time. Vascular/Lymphatic: Extensive aortic atherosclerosis with fusiform aneurysmal dilatation of the infrarenal abdominal aorta which measures up to 3.8 x 3.3 cm. 10 mm aortocaval lymph node (axial image 78 of series 2) is unchanged and nonspecific. No other lymphadenopathy noted elsewhere in the abdomen or pelvis. Reproductive: Prostate gland and seminal vesicles are unremarkable in appearance. Other: No significant volume of ascites.  No pneumoperitoneum. Musculoskeletal: There are no aggressive appearing lytic  or blastic lesions noted in the visualized portions of the skeleton. IMPRESSION: 1. Today's study demonstrates a mixed response to therapy. Specifically, the recurrence in the left hilar region appears  significantly improved when compared to the prior examination, however, there is progressive nodal disease in the anterior and superior mediastinum, and marked worsening official nodularity along the left major fissure which raises concern for developing lymphangitic spread of disease. 2. No evidence of metastatic disease to the abdomen or pelvis. 3. Aortic atherosclerosis, in addition to 2 vessel coronary artery disease. Assessment for potential risk factor modification, dietary therapy or pharmacologic therapy may be warranted, if clinically indicated. 4. In addition, there is fusiform aneurysmal dilatation of the infrarenal abdominal aorta which measures up to 3.8 x 3.3 cm, similar to the prior study. Recommend followup by ultrasound in 2 years. This recommendation follows ACR consensus guidelines: White Paper of the ACR Incidental Findings Committee II on Vascular Findings. J Am Coll Radiol 2013; 10:789-794. 5. Colonic diverticulosis without evidence of acute diverticulitis at this time. 6. Cholelithiasis without evidence of acute cholecystitis. 7. Diffuse bronchial wall thickening with mild to moderate centrilobular and paraseptal emphysema; imaging findings suggestive of underlying COPD. 8. Additional incidental findings, as above. Aortic Atherosclerosis (ICD10-I70.0) and Emphysema (ICD10-J43.9). Electronically Signed   By: Vinnie Langton M.D.   On: 11/24/2017 09:34    ASSESSMENT AND PLAN:  This is a very pleasant 71 years old white male with stage IV non-small cell lung cancer, adenocarcinoma with positive PDL 1 expression of 80% status post 6 cycles of treatment with [place] discontinued secondary to disease progression.  The patient underwent systemic chemotherapy with carboplatin, Alimta and Avastin status post 6 cycles.  He patient was started on maintenance treatment with Alimta and Avastin status post 3 cycles. He tolerated the treatment well except for fatigue. His restaging scan after the 3  cycles of the maintenance chemotherapy showed evidence for disease progression with increase in left hilar soft tissue mass with encasement of the left lower lobe bronchus and pulmonary artery. The patient underwent palliative radiotherapy to this area under the care of Dr. Sondra Come. The patient had repeat CT scan of the chest, abdomen and pelvis performed recently.  He has a scan showed mixed response with improvement in the left hilar soft tissue mass and increase in the mediastinal lymphadenopathy. I personally and independently reviewed the scan images and discussed the results with the patient and his wife. I recommended for him to proceed with his systemic chemotherapy with docetaxel and Cyramza today as a scheduled. I reminded the patient of the adverse effect of this treatment.  He was advised to call immediately if he has any bleeding issues. 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 For the uncontrolled hypertension, the patient has a lot of anxiety today about his scan results and I strongly recommended for him to monitor his blood pressure closely at home and to report to his primary care physician if no improvement in his condition. For the mouth sores, I gave him a refill of the Magic mouthwash. The patient was advised to call immediately if he has any concerning symptoms in the interval. The patient voices understanding of current disease status and treatment options and is in agreement with the current care plan. All questions were answered. The patient knows to call the clinic with any problems, questions or concerns. We can certainly see the patient much sooner if necessary.  Disclaimer: This note was dictated with voice recognition software.  Similar sounding words can inadvertently be transcribed and may not be corrected upon review.

## 2017-11-25 ENCOUNTER — Ambulatory Visit: Payer: Medicare Other

## 2017-11-26 ENCOUNTER — Ambulatory Visit (HOSPITAL_BASED_OUTPATIENT_CLINIC_OR_DEPARTMENT_OTHER): Payer: Medicare Other

## 2017-11-26 VITALS — BP 158/84 | HR 88 | Temp 98.1°F | Resp 20

## 2017-11-26 DIAGNOSIS — C3432 Malignant neoplasm of lower lobe, left bronchus or lung: Secondary | ICD-10-CM | POA: Diagnosis present

## 2017-11-26 DIAGNOSIS — Z5189 Encounter for other specified aftercare: Secondary | ICD-10-CM

## 2017-11-26 DIAGNOSIS — C3492 Malignant neoplasm of unspecified part of left bronchus or lung: Secondary | ICD-10-CM

## 2017-11-26 MED ORDER — PEGFILGRASTIM INJECTION 6 MG/0.6ML ~~LOC~~
6.0000 mg | PREFILLED_SYRINGE | Freq: Once | SUBCUTANEOUS | Status: AC
  Administered 2017-11-26: 6 mg via SUBCUTANEOUS

## 2017-11-26 MED ORDER — PEGFILGRASTIM INJECTION 6 MG/0.6ML ~~LOC~~
PREFILLED_SYRINGE | SUBCUTANEOUS | Status: AC
  Filled 2017-11-26: qty 0.6

## 2017-11-26 NOTE — Patient Instructions (Signed)
Pegfilgrastim injection What is this medicine? PEGFILGRASTIM (PEG fil gra stim) is a long-acting granulocyte colony-stimulating factor that stimulates the growth of neutrophils, a type of white blood cell important in the body's fight against infection. It is used to reduce the incidence of fever and infection in patients with certain types of cancer who are receiving chemotherapy that affects the bone marrow, and to increase survival after being exposed to high doses of radiation. This medicine may be used for other purposes; ask your health care provider or pharmacist if you have questions. COMMON BRAND NAME(S): Neulasta What should I tell my health care provider before I take this medicine? They need to know if you have any of these conditions: -kidney disease -latex allergy -ongoing radiation therapy -sickle cell disease -skin reactions to acrylic adhesives (On-Body Injector only) -an unusual or allergic reaction to pegfilgrastim, filgrastim, other medicines, foods, dyes, or preservatives -pregnant or trying to get pregnant -breast-feeding How should I use this medicine? This medicine is for injection under the skin. If you get this medicine at home, you will be taught how to prepare and give the pre-filled syringe or how to use the On-body Injector. Refer to the patient Instructions for Use for detailed instructions. Use exactly as directed. Tell your healthcare provider immediately if you suspect that the On-body Injector may not have performed as intended or if you suspect the use of the On-body Injector resulted in a missed or partial dose. It is important that you put your used needles and syringes in a special sharps container. Do not put them in a trash can. If you do not have a sharps container, call your pharmacist or healthcare provider to get one. Talk to your pediatrician regarding the use of this medicine in children. While this drug may be prescribed for selected conditions,  precautions do apply. Overdosage: If you think you have taken too much of this medicine contact a poison control center or emergency room at once. NOTE: This medicine is only for you. Do not share this medicine with others. What if I miss a dose? It is important not to miss your dose. Call your doctor or health care professional if you miss your dose. If you miss a dose due to an On-body Injector failure or leakage, a new dose should be administered as soon as possible using a single prefilled syringe for manual use. What may interact with this medicine? Interactions have not been studied. Give your health care provider a list of all the medicines, herbs, non-prescription drugs, or dietary supplements you use. Also tell them if you smoke, drink alcohol, or use illegal drugs. Some items may interact with your medicine. This list may not describe all possible interactions. Give your health care provider a list of all the medicines, herbs, non-prescription drugs, or dietary supplements you use. Also tell them if you smoke, drink alcohol, or use illegal drugs. Some items may interact with your medicine. What should I watch for while using this medicine? You may need blood work done while you are taking this medicine. If you are going to need a MRI, CT scan, or other procedure, tell your doctor that you are using this medicine (On-Body Injector only). What side effects may I notice from receiving this medicine? Side effects that you should report to your doctor or health care professional as soon as possible: -allergic reactions like skin rash, itching or hives, swelling of the face, lips, or tongue -dizziness -fever -pain, redness, or irritation at site   where injected -pinpoint red spots on the skin -red or dark-brown urine -shortness of breath or breathing problems -stomach or side pain, or pain at the shoulder -swelling -tiredness -trouble passing urine or change in the amount of urine Side  effects that usually do not require medical attention (report to your doctor or health care professional if they continue or are bothersome): -bone pain -muscle pain This list may not describe all possible side effects. Call your doctor for medical advice about side effects. You may report side effects to FDA at 1-800-FDA-1088. Where should I keep my medicine? Keep out of the reach of children. Store pre-filled syringes in a refrigerator between 2 and 8 degrees C (36 and 46 degrees F). Do not freeze. Keep in carton to protect from light. Throw away this medicine if it is left out of the refrigerator for more than 48 hours. Throw away any unused medicine after the expiration date. NOTE: This sheet is a summary. It may not cover all possible information. If you have questions about this medicine, talk to your doctor, pharmacist, or health care provider.  2018 [employer] (2016-11-12 12:58:03)  

## 2017-12-01 ENCOUNTER — Encounter: Payer: Self-pay | Admitting: Oncology

## 2017-12-02 ENCOUNTER — Ambulatory Visit (HOSPITAL_BASED_OUTPATIENT_CLINIC_OR_DEPARTMENT_OTHER): Payer: Medicare Other

## 2017-12-02 ENCOUNTER — Other Ambulatory Visit: Payer: Self-pay

## 2017-12-02 ENCOUNTER — Other Ambulatory Visit (HOSPITAL_BASED_OUTPATIENT_CLINIC_OR_DEPARTMENT_OTHER): Payer: Medicare Other

## 2017-12-02 ENCOUNTER — Ambulatory Visit (HOSPITAL_BASED_OUTPATIENT_CLINIC_OR_DEPARTMENT_OTHER): Payer: Medicare Other | Admitting: Medical

## 2017-12-02 ENCOUNTER — Telehealth: Payer: Self-pay | Admitting: Oncology

## 2017-12-02 ENCOUNTER — Ambulatory Visit
Admission: RE | Admit: 2017-12-02 | Discharge: 2017-12-02 | Disposition: A | Discharge status: Home / Self Care | Payer: Medicare Other | Source: Ambulatory Visit | Attending: Radiation Oncology | Admitting: Radiation Oncology

## 2017-12-02 ENCOUNTER — Encounter: Payer: Self-pay | Admitting: Radiation Oncology

## 2017-12-02 VITALS — BP 149/81 | HR 93 | Temp 97.9°F | Resp 16 | Ht 70.0 in | Wt 189.1 lb

## 2017-12-02 VITALS — BP 134/79 | HR 99 | Temp 97.9°F | Resp 18 | Wt 188.1 lb

## 2017-12-02 VITALS — BP 146/82

## 2017-12-02 DIAGNOSIS — E860 Dehydration: Secondary | ICD-10-CM

## 2017-12-02 DIAGNOSIS — Z923 Personal history of irradiation: Secondary | ICD-10-CM | POA: Diagnosis not present

## 2017-12-02 DIAGNOSIS — C3490 Malignant neoplasm of unspecified part of unspecified bronchus or lung: Secondary | ICD-10-CM

## 2017-12-02 DIAGNOSIS — K1231 Oral mucositis (ulcerative) due to antineoplastic therapy: Secondary | ICD-10-CM

## 2017-12-02 DIAGNOSIS — C3492 Malignant neoplasm of unspecified part of left bronchus or lung: Secondary | ICD-10-CM

## 2017-12-02 DIAGNOSIS — Z7901 Long term (current) use of anticoagulants: Secondary | ICD-10-CM | POA: Diagnosis not present

## 2017-12-02 DIAGNOSIS — Z96643 Presence of artificial hip joint, bilateral: Secondary | ICD-10-CM | POA: Insufficient documentation

## 2017-12-02 DIAGNOSIS — Z79899 Other long term (current) drug therapy: Secondary | ICD-10-CM | POA: Insufficient documentation

## 2017-12-02 LAB — COMPREHENSIVE METABOLIC PANEL
ALT: 34 U/L (ref 0–55)
AST: 22 U/L (ref 5–34)
Albumin: 3.1 g/dL — ABNORMAL LOW (ref 3.5–5.0)
Alkaline Phosphatase: 84 U/L (ref 40–150)
Anion Gap: 12 mEq/L — ABNORMAL HIGH (ref 3–11)
BUN: 31.8 mg/dL — ABNORMAL HIGH (ref 7.0–26.0)
CHLORIDE: 101 meq/L (ref 98–109)
CO2: 26 meq/L (ref 22–29)
Calcium: 9.4 mg/dL (ref 8.4–10.4)
Creatinine: 1.6 mg/dL — ABNORMAL HIGH (ref 0.7–1.3)
EGFR: 42 mL/min/{1.73_m2} — AB (ref >60)
Glucose: 117 mg/dl (ref 70–140)
POTASSIUM: 4.6 meq/L (ref 3.5–5.1)
SODIUM: 139 meq/L (ref 136–145)
Total Bilirubin: 0.77 mg/dL (ref 0.20–1.20)
Total Protein: 7.6 g/dL (ref 6.4–8.3)

## 2017-12-02 LAB — CBC WITH DIFFERENTIAL/PLATELET
BASO%: 0.2 % (ref 0.0–2.0)
BASOS ABS: 0 10*3/uL (ref 0.0–0.1)
EOS%: 0 % (ref 0.0–7.0)
Eosinophils Absolute: 0 10*3/uL (ref 0.0–0.5)
HEMATOCRIT: 48.4 % (ref 38.4–49.9)
HEMOGLOBIN: 15.8 g/dL (ref 13.0–17.1)
LYMPH#: 0.8 10*3/uL — AB (ref 0.9–3.3)
LYMPH%: 15.7 % (ref 14.0–49.0)
MCH: 33.5 pg — AB (ref 27.2–33.4)
MCHC: 32.6 g/dL (ref 32.0–36.0)
MCV: 102.8 fL — AB (ref 79.3–98.0)
MONO#: 1.3 10*3/uL — AB (ref 0.1–0.9)
MONO%: 25.9 % — ABNORMAL HIGH (ref 0.0–14.0)
NEUT#: 3 10*3/uL (ref 1.5–6.5)
NEUT%: 58.2 % (ref 39.0–75.0)
NRBC: 0 % (ref 0–0)
Platelets: 48 10*3/uL — ABNORMAL LOW (ref 140–400)
RBC: 4.71 10*6/uL (ref 4.20–5.82)
RDW: 15.2 % — ABNORMAL HIGH (ref 11.0–14.6)
WBC: 5.1 10*3/uL (ref 4.0–10.3)

## 2017-12-02 LAB — UA PROTEIN, DIPSTICK - CHCC: PROTEIN: 300 mg/dL

## 2017-12-02 MED ORDER — MORPHINE SULFATE (PF) 4 MG/ML IV SOLN
INTRAVENOUS | Status: AC
Start: 2017-12-02 — End: ?
  Filled 2017-12-02: qty 1

## 2017-12-02 MED ORDER — SODIUM CHLORIDE 0.9 % IV SOLN
Freq: Once | INTRAVENOUS | Status: AC
  Administered 2017-12-02: 14:00:00 via INTRAVENOUS

## 2017-12-02 MED ORDER — OXYCODONE HCL 5 MG/5ML PO SOLN: 5.0000 mg | ORAL | 0 refills | Status: DC | PRN

## 2017-12-02 MED ORDER — MORPHINE SULFATE 10 MG/5ML PO SOLN: 5.0000 mg | ORAL | 0 refills | Status: DC | PRN

## 2017-12-02 MED ORDER — SODIUM CHLORIDE 0.9% FLUSH
10.0000 mL | INTRAVENOUS | Status: DC | PRN
  Administered 2017-12-02: 10 mL via INTRAVENOUS
  Filled 2017-12-02: qty 10

## 2017-12-02 MED ORDER — LIDOCAINE VISCOUS 2 % MT SOLN: 5.0000 mL | OROMUCOSAL | 1 refills | Status: DC | PRN

## 2017-12-02 MED ORDER — HEPARIN SOD (PORK) LOCK FLUSH 100 UNIT/ML IV SOLN
500.0000 [IU] | Freq: Once | INTRAVENOUS | Status: AC | PRN
  Administered 2017-12-02: 500 [IU] via INTRAVENOUS
  Filled 2017-12-02: qty 5

## 2017-12-02 MED ORDER — OXYCODONE HCL 10 MG/0.5ML PO CONC: 0.2500 mL | ORAL | 0 refills | Status: DC | PRN

## 2017-12-02 MED ORDER — OXYCODONE HCL 5 MG/5ML PO SOLN: 5.0000 mg | ORAL | 0 refills | Status: AC | PRN

## 2017-12-02 MED ORDER — MORPHINE SULFATE (PF) 4 MG/ML IV SOLN
2.0000 mg | Freq: Once | INTRAVENOUS | Status: AC
  Administered 2017-12-02: 2 mg via INTRAVENOUS

## 2017-12-02 MED FILL — oxyCODONE HCL 5 MG/5ML SOLN: 5 | 7 days supply | Qty: 200 | Fill #0

## 2017-12-02 NOTE — Telephone Encounter (Signed)
Called [facility] and spoke to Sandi Mealy, PA-C regarding patient feeling weak, having mucositis.  He agreed to see patient today after his lab appointment.

## 2017-12-02 NOTE — Patient Instructions (Signed)
Dehydration, Adult Dehydration is when there is not enough fluid or water in your body. This happens when you lose more fluids than you take in. Dehydration can range from mild to very bad. It should be treated right away to keep it from getting very bad. Symptoms of mild dehydration may include:  Thirst.  Dry lips.  Slightly dry mouth.  Dry, warm skin.  Dizziness. Symptoms of moderate dehydration may include:  Very dry mouth.  Muscle cramps.  Dark pee (urine). Pee may be the color of tea.  Your body making less pee.  Your eyes making fewer tears.  Heartbeat that is uneven or faster than normal (palpitations).  Headache.  Light-headedness, especially when you stand up from sitting.  Fainting (syncope). Symptoms of very bad dehydration may include:  Changes in skin, such as: ? Cold and clammy skin. ? Blotchy (mottled) or pale skin. ? Skin that does not quickly return to normal after being lightly pinched and let go (poor skin turgor).  Changes in body fluids, such as: ? Feeling very thirsty. ? Your eyes making fewer tears. ? Not sweating when body temperature is high, such as in hot weather. ? Your body making very little pee.  Changes in vital signs, such as: ? Weak pulse. ? Pulse that is more than 100 beats a minute when you are sitting still. ? Fast breathing. ? Low blood pressure.  Other changes, such as: ? Sunken eyes. ? Cold hands and feet. ? Confusion. ? Lack of energy (lethargy). ? Trouble waking up from sleep. ? Short-term weight loss. ? Unconsciousness. Follow these instructions at home:  If told by your doctor, drink an ORS: ? Make an ORS by using instructions on the package. ? Start by drinking small amounts, about  cup (120 mL) every 5-10 minutes. ? Slowly drink more until you have had the amount that your doctor said to have.  Drink enough clear fluid to keep your pee clear or pale yellow. If you were told to drink an ORS, finish the ORS  first, then start slowly drinking clear fluids. Drink fluids such as: ? Water. Do not drink only water by itself. Doing that can make the salt (sodium) level in your body get too low (hyponatremia). ? Ice chips. ? Fruit juice that you have added water to (diluted). ? Low-calorie sports drinks.  Avoid: ? Alcohol. ? Drinks that have a lot of sugar. These include high-calorie sports drinks, fruit juice that does not have water added, and soda. ? Caffeine. ? Foods that are greasy or have a lot of fat or sugar.  Take over-the-counter and prescription medicines only as told by your doctor.  Do not take salt tablets. Doing that can make the salt level in your body get too high (hypernatremia).  Eat foods that have minerals (electrolytes). Examples include bananas, oranges, potatoes, tomatoes, and spinach.  Keep all follow-up visits as told by your doctor. This is important. Contact a doctor if:  You have belly (abdominal) pain that: ? Gets worse. ? Stays in one area (localizes).  You have a rash.  You have a stiff neck.  You get angry or annoyed more easily than normal (irritability).  You are more sleepy than normal.  You have a harder time waking up than normal.  You feel: ? Weak. ? Dizzy. ? Very thirsty.  You have peed (urinated) only a small amount of very dark pee during 6-8 hours. Get help right away if:  You have symptoms of   very bad dehydration.  You cannot drink fluids without throwing up (vomiting).  Your symptoms get worse with treatment.  You have a fever.  You have a very bad headache.  You are throwing up or having watery poop (diarrhea) and it: ? Gets worse. ? Does not go away.  You have blood or something green (bile) in your throw-up.  You have blood in your poop (stool). This may cause poop to look black and tarry.  You have not peed in 6-8 hours.  You pass out (faint).  Your heart rate when you are sitting still is more than 100 beats a  minute.  You have trouble breathing. This information is not intended to replace advice given to you by your health care provider. Make sure you discuss any questions you have with your health care provider. Document Released: 09/12/2009 Document Revised: 06/05/2016 Document Reviewed: 01/10/2016 [employer] Interactive Patient Education  2018 [employer]  

## 2017-12-02 NOTE — Progress Notes (Signed)
Radiation Oncology         [phone] ________________________________  Name: Nicholas Hale MRN: [id number]  Date: 12/02/2017  DOB: 1946/03/30  Follow-Up Visit Note  CC: Rankins, [name], MD  [name], MD    ICD-10-CM   1. Malignant neoplasm of lung, unspecified laterality, unspecified part of lung ([place]) C34.90     Diagnosis:   Stage IV (T1a, N3, M1b) non-small cell lung cancer, adenocarcinoma with PDL 1 expression of 80%, now with local progression   Interval Since Last Radiation:  5 weeks  Narrative:  The patient returns today for routine follow-up. Of note, since patient's last visit, he had a CT of the abdomen, pelvis and chest which revealed a mixed response to therapy. The recurrence in the left hilar region appears significantly improved in comparison to prior examination, but there is progressive nodal disease in the anterior and superior mediastinum. There is also marked worsening official nodularity along the left major fissure raising concern for developing lymphangitic spread of disease. There is no evidence of metastatic disease in the abdomen or pelvis.   Patient presents today with his wife.  He reports some throat irritation because of the chemotherapy. He reports some weakness and loss of balance so he has been ambulating with his walker. His wife reports after his last Neulasta injection last week he experienced some sleep disturbance as well as what she describes as symptoms of dementia. He denies dysphagia.                      ALLERGIES:  has No Known Allergies.  Meds: Current Outpatient Medications  Medication Sig Dispense Refill  . atorvastatin (LIPITOR) 20 MG tablet TAKE 1 TABLET BY MOUTH EVERY DAY AT 6PM 90 tablet 2  . chlorpheniramine-HYDROcodone (TUSSIONEX) 10-8 MG/5ML SUER Take 5 mLs at bedtime as needed by mouth for cough. 115 mL 0  . dexamethasone (DECADRON) 4 MG tablet 4 mg by mouth twice a day the day before, day of and day after the  chemotherapy every 3 weeks. 40 tablet 1  . lidocaine-prilocaine (EMLA) cream Apply 1 application topically as needed (on port site).     . magic mouthwash SOLN Take 5 mLs by mouth 4 (four) times daily. Prednisone 80 mg , Nystatin 30 ml, Benadryl 12.5 mg/5 ml. Mix to 240 ml, 1:1:1 concentration. 240 mL 1  . oxyCODONE-acetaminophen (PERCOCET/ROXICET) 5-325 MG tablet Take 1 tablet by mouth every 4 (four) hours as needed for severe pain. 30 tablet 0  . prochlorperazine (COMPAZINE) 10 MG tablet Take 1 tablet (10 mg total) by mouth every 6 (six) hours as needed for nausea or vomiting. 30 tablet 0  . XARELTO 20 MG TABS tablet TAKE 1 TABLET BY MOUTH EVERY DAY 30 tablet 5  . albuterol (PROVENTIL HFA;VENTOLIN HFA) 108 (90 Base) MCG/ACT inhaler Inhale 2 puffs into the lungs every 4 (four) hours as needed for wheezing or shortness of breath. (Patient not taking: Reported on 12/02/2017) 1 Inhaler 5  . dextromethorphan-guaiFENesin (MUCINEX DM) 30-600 MG 12hr tablet Take 2 tablets by mouth 2 (two) times daily.    . sucralfate (CARAFATE) 1 g tablet Take 1 tablet (1 g total) by mouth 4 (four) times daily -  with meals and at bedtime. Crush and dissolve in 10 cc of water prior to swallowing (Patient not taking: Reported on 11/24/2017) 30 tablet 1   No current facility-administered medications for this encounter.    Facility-Administered Medications Ordered in Other Encounters  Medication Dose Route Frequency Provider Last Rate Last Dose  . heparin lock flush 100 unit/mL  500 Units Intravenous Once PRN [name], MD      . sodium chloride flush (NS) 0.9 % injection 10 mL  10 mL Intravenous PRN [name], MD   10 mL at 08/11/17 0940  . sodium chloride flush (NS) 0.9 % injection 10 mL  10 mL Intravenous PRN [name], MD        Physical Findings: The patient is in no acute distress. Patient is alert and oriented.  weight is 188 lb 2 oz (85.3 kg). His oral temperature is 97.9 F (36.6 C). His  blood pressure is 134/79 and his pulse is 99. His respiration is 18 and oxygen saturation is 100%. .    Lungs are clear to auscultation bilaterally. Heart has regular rate and rhythm. No palpable cervical, supraclavicular, or axillary adenopathy. Abdomen soft, non-tender, normal bowel sounds. Oral cavity with confluent mucositis present.    Lab Findings: Lab Results  Component Value Date   WBC 12.6 (H) 11/24/2017   HGB 13.4 11/24/2017   HCT 43.0 11/24/2017   MCV 107.5 (H) 11/24/2017   PLT 125 (L) 11/24/2017    Radiographic Findings: Ct Chest W Contrast  Result Date: 11/24/2017 CLINICAL DATA:  72 year old male with history of lung cancer diagnosed in October 2017. Treated with immunotherapy. Previously treated with radiation therapy. EXAM: CT CHEST, ABDOMEN, AND PELVIS WITH CONTRAST TECHNIQUE: Multidetector CT imaging of the chest, abdomen and pelvis was performed following the standard protocol during bolus administration of intravenous contrast. CONTRAST:  168mL ISOVUE-300 IOPAMIDOL (ISOVUE-300) INJECTION 61% COMPARISON:  CT of the chest, abdomen and pelvis 09/20/2017. FINDINGS: CT CHEST FINDINGS Cardiovascular: Heart size is normal. There is no significant pericardial fluid, thickening or pericardial calcification. There is aortic atherosclerosis, as well as atherosclerosis of the great vessels of the mediastinum and the coronary arteries, including calcified atherosclerotic plaque in the left anterior descending and right coronary arteries. Mediastinum/Nodes: Compared to the prior examination, lymphadenopathy in the mediastinum has returned, with the largest mediastinal lymph node in the prevascular nodal station measuring up to 14 mm in short axis (axial image 21 of series 2), previously only 4 mm in short axis in the same position on 09/20/2017. 11 mm short axis superior mediastinal node just posterior to the proximal left subclavian artery (axial image 15 of series 2) increased compared to  the prior examination. Several other prominent but nonenlarged prevascular lymph nodes are also noted. By comparison, confluence of enlarged lymph nodes in the left hilar region seen on the prior study has largely resolved, with the largest residual node in this region measuring only 9 mm in short axis (axial image 38 series 2). Esophagus is unremarkable in appearance. No axillary lymphadenopathy. Lungs/Pleura: Previously noted small nodule in the periphery of the left lower lobe has decreased in size, currently measuring 14 x 9 mm (axial image 98 of series 4) is as compared with 17 x 11 mm on the prior study. New nodularity noted along the inferior aspect of the left major fissure, with what appears to be some new consolidative opacity in the dependent portion of the left upper lobe abutting the major fissure, of uncertain etiology and significance. Diffuse bronchial wall thickening with mild to moderate centrilobular and paraseptal emphysema. Scattered areas of mild septal thickening, similar to the prior examination. No pleural effusions. Musculoskeletal: Orthopedic fixation hardware in the lower cervical spine incidentally noted. Orthopedic fixation hardware in left proximal  humerus incompletely imaged. There are no aggressive appearing lytic or blastic lesions noted in the visualized portions of the skeleton. CT ABDOMEN PELVIS FINDINGS Hepatobiliary: 13 mm intermediate attenuation (31 HU) lesion in segment 4A of the liver is slightly smaller than prior studies, favored to represent an involuting cyst. No other suspicious appearing hepatic lesions. No intra or extrahepatic biliary ductal dilatation. 2 mm calcified gallstone in the neck of the gallbladder. No findings to suggest an acute cholecystitis at this time. Pancreas: No pancreatic mass. No pancreatic ductal dilatation. No pancreatic or peripancreatic fluid or inflammatory changes. Spleen: Unremarkable. Adrenals/Urinary Tract: Multiple low-attenuation  lesions in the right kidney, compatible with simple cysts, the largest of which is exophytic extending off the anterior aspect of the lower pole measuring 11.4 cm. Several other subcentimeter low-attenuation lesions in both kidneys are too small to definitively characterize, but are favored to represent tiny cysts. Bilateral adrenal glands are normal in appearance. No hydroureteronephrosis. Urinary bladder is largely obscured by beam hardening artifact from bilateral hip arthroplasties. Stomach/Bowel: Normal appearance of the stomach. No pathologic dilatation of small bowel or colon. Numerous colonic diverticulae are noted, without surrounding inflammatory changes to suggest an acute diverticulitis at this time. The appendix is not confidently identified and may be surgically absent. Regardless, there are no inflammatory changes noted adjacent to the cecum to suggest the presence of an acute appendicitis at this time. Vascular/Lymphatic: Extensive aortic atherosclerosis with fusiform aneurysmal dilatation of the infrarenal abdominal aorta which measures up to 3.8 x 3.3 cm. 10 mm aortocaval lymph node (axial image 78 of series 2) is unchanged and nonspecific. No other lymphadenopathy noted elsewhere in the abdomen or pelvis. Reproductive: Prostate gland and seminal vesicles are unremarkable in appearance. Other: No significant volume of ascites.  No pneumoperitoneum. Musculoskeletal: There are no aggressive appearing lytic or blastic lesions noted in the visualized portions of the skeleton. IMPRESSION: 1. Today's study demonstrates a mixed response to therapy. Specifically, the recurrence in the left hilar region appears significantly improved when compared to the prior examination, however, there is progressive nodal disease in the anterior and superior mediastinum, and marked worsening official nodularity along the left major fissure which raises concern for developing lymphangitic spread of disease. 2. No evidence  of metastatic disease to the abdomen or pelvis. 3. Aortic atherosclerosis, in addition to 2 vessel coronary artery disease. Assessment for potential risk factor modification, dietary therapy or pharmacologic therapy may be warranted, if clinically indicated. 4. In addition, there is fusiform aneurysmal dilatation of the infrarenal abdominal aorta which measures up to 3.8 x 3.3 cm, similar to the prior study. Recommend followup by ultrasound in 2 years. This recommendation follows ACR consensus guidelines: White Paper of the ACR Incidental Findings Committee II on Vascular Findings. J Am Coll Radiol 2013; 10:789-794. 5. Colonic diverticulosis without evidence of acute diverticulitis at this time. 6. Cholelithiasis without evidence of acute cholecystitis. 7. Diffuse bronchial wall thickening with mild to moderate centrilobular and paraseptal emphysema; imaging findings suggestive of underlying COPD. 8. Additional incidental findings, as above. Aortic Atherosclerosis (ICD10-I70.0) and Emphysema (ICD10-J43.9). Electronically Signed   By: Vinnie Langton M.D.   On: 11/24/2017 09:34   Ct Abdomen Pelvis W Contrast  Result Date: 11/24/2017 CLINICAL DATA:  72 year old male with history of lung cancer diagnosed in October 2017. Treated with immunotherapy. Previously treated with radiation therapy. EXAM: CT CHEST, ABDOMEN, AND PELVIS WITH CONTRAST TECHNIQUE: Multidetector CT imaging of the chest, abdomen and pelvis was performed following the standard protocol during bolus  administration of intravenous contrast. CONTRAST:  144mL ISOVUE-300 IOPAMIDOL (ISOVUE-300) INJECTION 61% COMPARISON:  CT of the chest, abdomen and pelvis 09/20/2017. FINDINGS: CT CHEST FINDINGS Cardiovascular: Heart size is normal. There is no significant pericardial fluid, thickening or pericardial calcification. There is aortic atherosclerosis, as well as atherosclerosis of the great vessels of the mediastinum and the coronary arteries, including  calcified atherosclerotic plaque in the left anterior descending and right coronary arteries. Mediastinum/Nodes: Compared to the prior examination, lymphadenopathy in the mediastinum has returned, with the largest mediastinal lymph node in the prevascular nodal station measuring up to 14 mm in short axis (axial image 21 of series 2), previously only 4 mm in short axis in the same position on 09/20/2017. 11 mm short axis superior mediastinal node just posterior to the proximal left subclavian artery (axial image 15 of series 2) increased compared to the prior examination. Several other prominent but nonenlarged prevascular lymph nodes are also noted. By comparison, confluence of enlarged lymph nodes in the left hilar region seen on the prior study has largely resolved, with the largest residual node in this region measuring only 9 mm in short axis (axial image 38 series 2). Esophagus is unremarkable in appearance. No axillary lymphadenopathy. Lungs/Pleura: Previously noted small nodule in the periphery of the left lower lobe has decreased in size, currently measuring 14 x 9 mm (axial image 98 of series 4) is as compared with 17 x 11 mm on the prior study. New nodularity noted along the inferior aspect of the left major fissure, with what appears to be some new consolidative opacity in the dependent portion of the left upper lobe abutting the major fissure, of uncertain etiology and significance. Diffuse bronchial wall thickening with mild to moderate centrilobular and paraseptal emphysema. Scattered areas of mild septal thickening, similar to the prior examination. No pleural effusions. Musculoskeletal: Orthopedic fixation hardware in the lower cervical spine incidentally noted. Orthopedic fixation hardware in left proximal humerus incompletely imaged. There are no aggressive appearing lytic or blastic lesions noted in the visualized portions of the skeleton. CT ABDOMEN PELVIS FINDINGS Hepatobiliary: 13 mm  intermediate attenuation (31 HU) lesion in segment 4A of the liver is slightly smaller than prior studies, favored to represent an involuting cyst. No other suspicious appearing hepatic lesions. No intra or extrahepatic biliary ductal dilatation. 2 mm calcified gallstone in the neck of the gallbladder. No findings to suggest an acute cholecystitis at this time. Pancreas: No pancreatic mass. No pancreatic ductal dilatation. No pancreatic or peripancreatic fluid or inflammatory changes. Spleen: Unremarkable. Adrenals/Urinary Tract: Multiple low-attenuation lesions in the right kidney, compatible with simple cysts, the largest of which is exophytic extending off the anterior aspect of the lower pole measuring 11.4 cm. Several other subcentimeter low-attenuation lesions in both kidneys are too small to definitively characterize, but are favored to represent tiny cysts. Bilateral adrenal glands are normal in appearance. No hydroureteronephrosis. Urinary bladder is largely obscured by beam hardening artifact from bilateral hip arthroplasties. Stomach/Bowel: Normal appearance of the stomach. No pathologic dilatation of small bowel or colon. Numerous colonic diverticulae are noted, without surrounding inflammatory changes to suggest an acute diverticulitis at this time. The appendix is not confidently identified and may be surgically absent. Regardless, there are no inflammatory changes noted adjacent to the cecum to suggest the presence of an acute appendicitis at this time. Vascular/Lymphatic: Extensive aortic atherosclerosis with fusiform aneurysmal dilatation of the infrarenal abdominal aorta which measures up to 3.8 x 3.3 cm. 10 mm aortocaval lymph node (axial  image 78 of series 2) is unchanged and nonspecific. No other lymphadenopathy noted elsewhere in the abdomen or pelvis. Reproductive: Prostate gland and seminal vesicles are unremarkable in appearance. Other: No significant volume of ascites.  No pneumoperitoneum.  Musculoskeletal: There are no aggressive appearing lytic or blastic lesions noted in the visualized portions of the skeleton. IMPRESSION: 1. Today's study demonstrates a mixed response to therapy. Specifically, the recurrence in the left hilar region appears significantly improved when compared to the prior examination, however, there is progressive nodal disease in the anterior and superior mediastinum, and marked worsening official nodularity along the left major fissure which raises concern for developing lymphangitic spread of disease. 2. No evidence of metastatic disease to the abdomen or pelvis. 3. Aortic atherosclerosis, in addition to 2 vessel coronary artery disease. Assessment for potential risk factor modification, dietary therapy or pharmacologic therapy may be warranted, if clinically indicated. 4. In addition, there is fusiform aneurysmal dilatation of the infrarenal abdominal aorta which measures up to 3.8 x 3.3 cm, similar to the prior study. Recommend followup by ultrasound in 2 years. This recommendation follows ACR consensus guidelines: White Paper of the ACR Incidental Findings Committee II on Vascular Findings. J Am Coll Radiol 2013; 10:789-794. 5. Colonic diverticulosis without evidence of acute diverticulitis at this time. 6. Cholelithiasis without evidence of acute cholecystitis. 7. Diffuse bronchial wall thickening with mild to moderate centrilobular and paraseptal emphysema; imaging findings suggestive of underlying COPD. 8. Additional incidental findings, as above. Aortic Atherosclerosis (ICD10-I70.0) and Emphysema (ICD10-J43.9). Electronically Signed   By: Vinnie Langton M.D.   On: 11/24/2017 09:34    Impression:  Stage IV (T1a, N3, M1b) non-small cell lung cancer, adenocarcinoma with PDL 1 expression of 80%, now with local progression  The patient is recovering from the effects of radiation. Patient is having significant side effects from his recent chemotherapy. He will present  to the lab this morning and then be seen by symptom management team in medical oncology. He may require IV fluid supplementation and topical oral agents.   Plan:  PRN follow up in radiation oncology. He will continue close follow up with medical oncology with ongoing chemotherapy.   ____________________________________  -----------------------------------  Blair Promise, PhD, MD   This document serves as a record of services personally performed by Gery Pray, MD. It was created on his behalf by Marlowe Kays, a trained medical scribe. The creation of this record is based on the scribe's personal observations and the provider's statements to them. This document has been checked and approved by the attending provider.

## 2017-12-02 NOTE — Progress Notes (Signed)
Patient is here today for a follow -up appointment. Patient states that he has a sore throat.Also states that he fell as if his mouth is swollen inside. Patient states that he is having difficulty with swallowing. States that he has a prescription for Carafate but, he has not been taking it because he need a refill.States that he has serious weakness(severve fatigue). States that he gets shortness of breath with activity.States that his appetite  is not good. Wife states that she has for force him to eat.Denies coughing up any blood of phlegm.Patient states that he has lost about 20 pounds over the last year. Vitals:   12/02/17 1011  BP: 134/79  Pulse: 99  Resp: 18  Temp: 97.9 F (36.6 C)  TempSrc: Oral  SpO2: 100%  Weight: 188 lb 2 oz (85.3 kg)   Wt Readings from Last 3 Encounters:  12/02/17 188 lb 2 oz (85.3 kg)  11/24/17 198 lb 4.8 oz (89.9 kg)  11/03/17 191 lb 9.6 oz (86.9 kg)

## 2017-12-03 NOTE — Progress Notes (Signed)
[facility] Progress Note   Nicholas Hale [id number] [birth year] 72 y.o.  Nicholas Hale is managed by Dr. Eilleen Kempf  Actively treated with chemotherapy: yes  Current Therapy: Docetaxel and Cyramza with Neulasta  Last Treated: 11/24/2017  Assessment: Plan:    Dehydration - Plan: 0.9 %  sodium chloride infusion  Mucositis due to chemotherapy - Plan: lidocaine (XYLOCAINE) 2 % solution, morphine 4 MG/ML injection 2 mg, oxyCODONE (ROXICODONE) 5 MG/5ML solution  Adenocarcinoma of left lung, stage 4 (HCC)   Dehydration: Nicholas Hale was given 1 L of IV normal saline.  Mucositis due to chemotherapy: Nicholas Hale was given a prescription for viscous lidocaine and was given a prescription for Roxicodone solution.  Stage IV adenocarcinoma of the left lung: Nicholas Hale will return for labs only on 12/09/2017 and will return on 12/16/2017 for consideration of his next cycle of chemotherapy.  Please see After Visit Summary for patient specific instructions.  Future Appointments  Date Time Provider Hall  12/09/2017 11:15 AM [facility] 1 [facility] None  12/16/2017  9:15 AM [facility] LAB 2 [facility] None  12/16/2017  9:30 AM [facility] J32 DNS [facility] None  12/16/2017 10:00 AM Curcio, Roselie Awkward, NP [facility] None  12/16/2017 11:00 AM [facility] G22 [facility] None  12/16/2017  1:45 PM Neff, Barbara L, RD [facility] None  12/18/2017 11:00 AM [facility] INJ NURSE [facility] None  12/23/2017 11:15 AM [facility] LAB 2 [facility] None  12/30/2017 11:15 AM [facility] LAB 1 [facility] None  01/06/2018  9:45 AM [facility] LAB 3 [facility] None  01/06/2018 10:00 AM [facility] FLUSH NURSE [facility] None  01/06/2018 10:30 AM [name], MD [facility] None  01/06/2018 11:30 AM [facility] B4 [facility] None  01/13/2018 11:00 AM [facility] LAB 2 [facility] None  01/20/2018 11:00 AM [facility] LAB 2 [facility] None  01/27/2018  9:45 AM  [facility] LAB 3 [facility] None  01/27/2018 10:00 AM [facility] J32 DNS [facility] None  01/27/2018 10:30 AM [name], MD [facility] None  01/27/2018 11:30 AM [facility] G22 [facility] None  01/29/2018 11:00 AM [facility] INJ NURSE [facility] None    No orders of the defined types were placed in this encounter.      Subjective:   Patient ID:  Nicholas Hale is a 72 y.o. (DOB 18-Nov-1946) male.  Chief Complaint:  Chief Complaint  Patient presents with  . Mucositis    HPI with a diagnosis of a stage IV (T1a, and 3, M1 B) non-small cell lung cancer, adenocarcinoma Nicholas Hale is a 72 year old male of the left lower lobe with bilateral mediastinal and supraclavicular lymphadenopathy as well as retroperitoneal lymphadenopathy and a malignant pericardial effusion.  The patient was originally diagnosed in October 2017.  He was originally treated with [facility] for 6 cycles then transition to carboplatinum, Alimta, and Avastin.  This was dosed from 02/24/2017 through 06/09/2017.  He was initiated on maintenance Alimta and Avastin on 07/21/2017.  He was given 3 cycles prior to discontinuing this therapy due to disease progression.  He was treated with palliative radiotherapy to a progressive left hilar soft tissue mass in November 2018.  He is now treated with docetaxel and Cyramza with his first cycle given on 11/25/2017.  He presents to the office today with a report of headaches, chills, weakness, shakiness, and oral tenderness with mouth sores since last Sunday.  He denies fevers or diaphoresis, nausea, vomiting, constipation, or diarrhea.  He has had difficulty eating due to his mucositis.  Medications: I have reviewed  the patient's current medications.  Allergies: No Known Allergies  Past Medical History:  Diagnosis Date  . Abdominal aortic aneurysm ([place])   . Arthritis    "hips; lower spine" (05/24/2014)  . Chemotherapy induced neutropenia ([place]) 03/31/2017  . Chronic  fatigue 10/05/2016  . Encounter for antineoplastic chemotherapy 02/17/2017  . History of radiation therapy 10/05/17-10/25/17   left lung/chest 35 Gy in 14 fractions  . Hyperlipidemia   . Hypertension   . Lung cancer ([place]) 09/25/2016  . PE (pulmonary embolism) 9/15  . Pericardial effusion 09/20/2016   Malignant effusion s/p pericardial drain  . Personal history of DVT (deep vein thrombosis) 9/15    Past Surgical History:  Procedure Laterality Date  . ANTERIOR FUSION CERVICAL SPINE  ~ 2005  . APPENDECTOMY  1978  . CARDIAC CATHETERIZATION N/A 09/21/2016   Procedure: Pericardiocentesis;  Surgeon: Jettie Booze, MD;  Location: [facility];  Service: Cardiovascular;  Laterality: N/A;  . COLONOSCOPY    . HEMIARTHROPLASTY SHOULDER FRACTURE Left 12/2008  . INGUINAL HERNIA REPAIR Bilateral 1992  . IR GENERIC HISTORICAL  02/01/2017   IR FLUORO GUIDE PORT INSERTION RIGHT 02/01/2017 Arne Cleveland, MD [facility]  . IR GENERIC HISTORICAL  02/01/2017   IR US GUIDE VASC ACCESS RIGHT 02/01/2017 Arne Cleveland, MD [facility]  . JOINT REPLACEMENT    . PERICARDIAL FLUID DRAINAGE  09/21/2016  . TOTAL HIP ARTHROPLASTY Left 05/23/2014   Procedure: TOTAL HIP ARTHROPLASTY;  Surgeon: Kerin Salen, MD;  Location: [place];  Service: Orthopedics;  Laterality: Left;  . TOTAL HIP ARTHROPLASTY Right 05/15/2015   Procedure: TOTAL HIP ARTHROPLASTY;  Surgeon: Frederik Pear, MD;  Location: [place];  Service: Orthopedics;  Laterality: Right;    Family History  Problem Relation Age of Onset  . Clotting disorder Mother   . Diabetes Mellitus I Mother   . Prostate cancer Father     Social History   Socioeconomic History  . Marital status: Married    Spouse name: Not on file  . Number of children: Not on file  . Years of education: Not on file  . Highest education level: Not on file  Social Needs  . Financial resource strain: Not on file  . Food insecurity - worry: Not on file  . Food insecurity -  inability: Not on file  . Transportation needs - medical: Not on file  . Transportation needs - non-medical: Not on file  Occupational History  . Not on file  Tobacco Use  . Smoking status: Former Smoker    Packs/day: 0.50    Years: 40.00    Pack years: 20.00    Types: Cigarettes    Last attempt to quit: 2010    Years since quitting: 9.0  . Smokeless tobacco: Never Used  Substance and Sexual Activity  . Alcohol use: No  . Drug use: No  . Sexual activity: No  Other Topics Concern  . Not on file  Social History Narrative  . Not on file    Past Medical History, Surgical history, Social history, and Family history were reviewed and updated as appropriate.   Please see review of systems for further details on the patient's review from today.   Review of Systems:  Review of Systems  Constitutional: Positive for appetite change, chills and fatigue. Negative for diaphoresis and fever.  HENT: Positive for mouth sores and trouble swallowing.   Respiratory: Negative for cough, choking, chest tightness, shortness of breath and wheezing.   Cardiovascular: Negative  for chest pain, palpitations and leg swelling.  Neurological: Positive for weakness.    Objective:   Physical Exam:  BP (!) 149/81 (BP Location: Right Arm, Patient Position: Sitting) Comment: RN Beth aware of BP  Pulse 93   Temp 97.9 F (36.6 C) (Oral)   Resp 16   Ht 5\' 10"  (1.778 m)   Wt 189 lb 1.6 oz (85.8 kg)   SpO2 100%   BMI 27.13 kg/m  ECOG: 1  Physical Exam  Constitutional: No distress.  HENT:  Head: Normocephalic and atraumatic.  Mouth/Throat:    Neck: Normal range of motion. Neck supple.  Cardiovascular: Normal rate, regular rhythm and normal heart sounds. Exam reveals no gallop and no friction rub.  No murmur heard. Pulmonary/Chest: Effort normal and breath sounds normal. No respiratory distress. He has no wheezes. He has no rales.  Musculoskeletal: He exhibits no edema.  Lymphadenopathy:    He  has no cervical adenopathy.  Neurological: He is alert.  Skin: Skin is warm and dry. No rash noted. He is not diaphoretic. No erythema.    Lab Review:     Component Value Date/Time   NA 139 12/02/2017 1055   K 4.6 12/02/2017 1055   CL 106 08/21/2017 0926   CO2 26 12/02/2017 1055   GLUCOSE 117 12/02/2017 1055   BUN 31.8 (H) 12/02/2017 1055   CREATININE 1.6 (H) 12/02/2017 1055   CALCIUM 9.4 12/02/2017 1055   PROT 7.6 12/02/2017 1055   ALBUMIN 3.1 (L) 12/02/2017 1055   AST 22 12/02/2017 1055   ALT 34 12/02/2017 1055   ALKPHOS 84 12/02/2017 1055   BILITOT 0.77 12/02/2017 1055   GFRNONAA >60 08/21/2017 0926   GFRAA >60 08/21/2017 0926       Component Value Date/Time   WBC 5.1 12/02/2017 1055   WBC 3.1 (L) 08/22/2017 0450   RBC 4.71 12/02/2017 1055   RBC 3.27 (L) 08/22/2017 0450   HGB 15.8 12/02/2017 1055   HCT 48.4 12/02/2017 1055   PLT 48 (L) 12/02/2017 1055   MCV 102.8 (H) 12/02/2017 1055   MCH 33.5 (H) 12/02/2017 1055   MCH 34.3 (H) 08/22/2017 0450   MCHC 32.6 12/02/2017 1055   MCHC 31.5 08/22/2017 0450   RDW 15.2 (H) 12/02/2017 1055   LYMPHSABS 0.8 (L) 12/02/2017 1055   MONOABS 1.3 (H) 12/02/2017 1055   EOSABS 0.0 12/02/2017 1055   BASOSABS 0.0 12/02/2017 1055   -------------------------------  Imaging from last 24 hours (if applicable):  Radiology interpretation: Ct Chest W Contrast  Result Date: 11/24/2017 CLINICAL DATA:  72 year old male with history of lung cancer diagnosed in October 2017. Treated with immunotherapy. Previously treated with radiation therapy. EXAM: CT CHEST, ABDOMEN, AND PELVIS WITH CONTRAST TECHNIQUE: Multidetector CT imaging of the chest, abdomen and pelvis was performed following the standard protocol during bolus administration of intravenous contrast. CONTRAST:  137mL ISOVUE-300 IOPAMIDOL (ISOVUE-300) INJECTION 61% COMPARISON:  CT of the chest, abdomen and pelvis 09/20/2017. FINDINGS: CT CHEST FINDINGS Cardiovascular: Heart size is  normal. There is no significant pericardial fluid, thickening or pericardial calcification. There is aortic atherosclerosis, as well as atherosclerosis of the great vessels of the mediastinum and the coronary arteries, including calcified atherosclerotic plaque in the left anterior descending and right coronary arteries. Mediastinum/Nodes: Compared to the prior examination, lymphadenopathy in the mediastinum has returned, with the largest mediastinal lymph node in the prevascular nodal station measuring up to 14 mm in short axis (axial image 21 of series 2), previously only 4 mm  in short axis in the same position on 09/20/2017. 11 mm short axis superior mediastinal node just posterior to the proximal left subclavian artery (axial image 15 of series 2) increased compared to the prior examination. Several other prominent but nonenlarged prevascular lymph nodes are also noted. By comparison, confluence of enlarged lymph nodes in the left hilar region seen on the prior study has largely resolved, with the largest residual node in this region measuring only 9 mm in short axis (axial image 38 series 2). Esophagus is unremarkable in appearance. No axillary lymphadenopathy. Lungs/Pleura: Previously noted small nodule in the periphery of the left lower lobe has decreased in size, currently measuring 14 x 9 mm (axial image 98 of series 4) is as compared with 17 x 11 mm on the prior study. New nodularity noted along the inferior aspect of the left major fissure, with what appears to be some new consolidative opacity in the dependent portion of the left upper lobe abutting the major fissure, of uncertain etiology and significance. Diffuse bronchial wall thickening with mild to moderate centrilobular and paraseptal emphysema. Scattered areas of mild septal thickening, similar to the prior examination. No pleural effusions. Musculoskeletal: Orthopedic fixation hardware in the lower cervical spine incidentally noted. Orthopedic  fixation hardware in left proximal humerus incompletely imaged. There are no aggressive appearing lytic or blastic lesions noted in the visualized portions of the skeleton. CT ABDOMEN PELVIS FINDINGS Hepatobiliary: 13 mm intermediate attenuation (31 HU) lesion in segment 4A of the liver is slightly smaller than prior studies, favored to represent an involuting cyst. No other suspicious appearing hepatic lesions. No intra or extrahepatic biliary ductal dilatation. 2 mm calcified gallstone in the neck of the gallbladder. No findings to suggest an acute cholecystitis at this time. Pancreas: No pancreatic mass. No pancreatic ductal dilatation. No pancreatic or peripancreatic fluid or inflammatory changes. Spleen: Unremarkable. Adrenals/Urinary Tract: Multiple low-attenuation lesions in the right kidney, compatible with simple cysts, the largest of which is exophytic extending off the anterior aspect of the lower pole measuring 11.4 cm. Several other subcentimeter low-attenuation lesions in both kidneys are too small to definitively characterize, but are favored to represent tiny cysts. Bilateral adrenal glands are normal in appearance. No hydroureteronephrosis. Urinary bladder is largely obscured by beam hardening artifact from bilateral hip arthroplasties. Stomach/Bowel: Normal appearance of the stomach. No pathologic dilatation of small bowel or colon. Numerous colonic diverticulae are noted, without surrounding inflammatory changes to suggest an acute diverticulitis at this time. The appendix is not confidently identified and may be surgically absent. Regardless, there are no inflammatory changes noted adjacent to the cecum to suggest the presence of an acute appendicitis at this time. Vascular/Lymphatic: Extensive aortic atherosclerosis with fusiform aneurysmal dilatation of the infrarenal abdominal aorta which measures up to 3.8 x 3.3 cm. 10 mm aortocaval lymph node (axial image 78 of series 2) is unchanged and  nonspecific. No other lymphadenopathy noted elsewhere in the abdomen or pelvis. Reproductive: Prostate gland and seminal vesicles are unremarkable in appearance. Other: No significant volume of ascites.  No pneumoperitoneum. Musculoskeletal: There are no aggressive appearing lytic or blastic lesions noted in the visualized portions of the skeleton. IMPRESSION: 1. Today's study demonstrates a mixed response to therapy. Specifically, the recurrence in the left hilar region appears significantly improved when compared to the prior examination, however, there is progressive nodal disease in the anterior and superior mediastinum, and marked worsening official nodularity along the left major fissure which raises concern for developing lymphangitic spread of disease.  2. No evidence of metastatic disease to the abdomen or pelvis. 3. Aortic atherosclerosis, in addition to 2 vessel coronary artery disease. Assessment for potential risk factor modification, dietary therapy or pharmacologic therapy may be warranted, if clinically indicated. 4. In addition, there is fusiform aneurysmal dilatation of the infrarenal abdominal aorta which measures up to 3.8 x 3.3 cm, similar to the prior study. Recommend followup by ultrasound in 2 years. This recommendation follows ACR consensus guidelines: White Paper of the ACR Incidental Findings Committee II on Vascular Findings. J Am Coll Radiol 2013; 10:789-794. 5. Colonic diverticulosis without evidence of acute diverticulitis at this time. 6. Cholelithiasis without evidence of acute cholecystitis. 7. Diffuse bronchial wall thickening with mild to moderate centrilobular and paraseptal emphysema; imaging findings suggestive of underlying COPD. 8. Additional incidental findings, as above. Aortic Atherosclerosis (ICD10-I70.0) and Emphysema (ICD10-J43.9). Electronically Signed   By: Vinnie Langton M.D.   On: 11/24/2017 09:34   Ct Abdomen Pelvis W Contrast  Result Date:  11/24/2017 CLINICAL DATA:  72 year old male with history of lung cancer diagnosed in October 2017. Treated with immunotherapy. Previously treated with radiation therapy. EXAM: CT CHEST, ABDOMEN, AND PELVIS WITH CONTRAST TECHNIQUE: Multidetector CT imaging of the chest, abdomen and pelvis was performed following the standard protocol during bolus administration of intravenous contrast. CONTRAST:  134mL ISOVUE-300 IOPAMIDOL (ISOVUE-300) INJECTION 61% COMPARISON:  CT of the chest, abdomen and pelvis 09/20/2017. FINDINGS: CT CHEST FINDINGS Cardiovascular: Heart size is normal. There is no significant pericardial fluid, thickening or pericardial calcification. There is aortic atherosclerosis, as well as atherosclerosis of the great vessels of the mediastinum and the coronary arteries, including calcified atherosclerotic plaque in the left anterior descending and right coronary arteries. Mediastinum/Nodes: Compared to the prior examination, lymphadenopathy in the mediastinum has returned, with the largest mediastinal lymph node in the prevascular nodal station measuring up to 14 mm in short axis (axial image 21 of series 2), previously only 4 mm in short axis in the same position on 09/20/2017. 11 mm short axis superior mediastinal node just posterior to the proximal left subclavian artery (axial image 15 of series 2) increased compared to the prior examination. Several other prominent but nonenlarged prevascular lymph nodes are also noted. By comparison, confluence of enlarged lymph nodes in the left hilar region seen on the prior study has largely resolved, with the largest residual node in this region measuring only 9 mm in short axis (axial image 38 series 2). Esophagus is unremarkable in appearance. No axillary lymphadenopathy. Lungs/Pleura: Previously noted small nodule in the periphery of the left lower lobe has decreased in size, currently measuring 14 x 9 mm (axial image 98 of series 4) is as compared with 17 x  11 mm on the prior study. New nodularity noted along the inferior aspect of the left major fissure, with what appears to be some new consolidative opacity in the dependent portion of the left upper lobe abutting the major fissure, of uncertain etiology and significance. Diffuse bronchial wall thickening with mild to moderate centrilobular and paraseptal emphysema. Scattered areas of mild septal thickening, similar to the prior examination. No pleural effusions. Musculoskeletal: Orthopedic fixation hardware in the lower cervical spine incidentally noted. Orthopedic fixation hardware in left proximal humerus incompletely imaged. There are no aggressive appearing lytic or blastic lesions noted in the visualized portions of the skeleton. CT ABDOMEN PELVIS FINDINGS Hepatobiliary: 13 mm intermediate attenuation (31 HU) lesion in segment 4A of the liver is slightly smaller than prior studies, favored to represent an  involuting cyst. No other suspicious appearing hepatic lesions. No intra or extrahepatic biliary ductal dilatation. 2 mm calcified gallstone in the neck of the gallbladder. No findings to suggest an acute cholecystitis at this time. Pancreas: No pancreatic mass. No pancreatic ductal dilatation. No pancreatic or peripancreatic fluid or inflammatory changes. Spleen: Unremarkable. Adrenals/Urinary Tract: Multiple low-attenuation lesions in the right kidney, compatible with simple cysts, the largest of which is exophytic extending off the anterior aspect of the lower pole measuring 11.4 cm. Several other subcentimeter low-attenuation lesions in both kidneys are too small to definitively characterize, but are favored to represent tiny cysts. Bilateral adrenal glands are normal in appearance. No hydroureteronephrosis. Urinary bladder is largely obscured by beam hardening artifact from bilateral hip arthroplasties. Stomach/Bowel: Normal appearance of the stomach. No pathologic dilatation of small bowel or colon.  Numerous colonic diverticulae are noted, without surrounding inflammatory changes to suggest an acute diverticulitis at this time. The appendix is not confidently identified and may be surgically absent. Regardless, there are no inflammatory changes noted adjacent to the cecum to suggest the presence of an acute appendicitis at this time. Vascular/Lymphatic: Extensive aortic atherosclerosis with fusiform aneurysmal dilatation of the infrarenal abdominal aorta which measures up to 3.8 x 3.3 cm. 10 mm aortocaval lymph node (axial image 78 of series 2) is unchanged and nonspecific. No other lymphadenopathy noted elsewhere in the abdomen or pelvis. Reproductive: Prostate gland and seminal vesicles are unremarkable in appearance. Other: No significant volume of ascites.  No pneumoperitoneum. Musculoskeletal: There are no aggressive appearing lytic or blastic lesions noted in the visualized portions of the skeleton. IMPRESSION: 1. Today's study demonstrates a mixed response to therapy. Specifically, the recurrence in the left hilar region appears significantly improved when compared to the prior examination, however, there is progressive nodal disease in the anterior and superior mediastinum, and marked worsening official nodularity along the left major fissure which raises concern for developing lymphangitic spread of disease. 2. No evidence of metastatic disease to the abdomen or pelvis. 3. Aortic atherosclerosis, in addition to 2 vessel coronary artery disease. Assessment for potential risk factor modification, dietary therapy or pharmacologic therapy may be warranted, if clinically indicated. 4. In addition, there is fusiform aneurysmal dilatation of the infrarenal abdominal aorta which measures up to 3.8 x 3.3 cm, similar to the prior study. Recommend followup by ultrasound in 2 years. This recommendation follows ACR consensus guidelines: White Paper of the ACR Incidental Findings Committee II on Vascular Findings. J  Am Coll Radiol 2013; 10:789-794. 5. Colonic diverticulosis without evidence of acute diverticulitis at this time. 6. Cholelithiasis without evidence of acute cholecystitis. 7. Diffuse bronchial wall thickening with mild to moderate centrilobular and paraseptal emphysema; imaging findings suggestive of underlying COPD. 8. Additional incidental findings, as above. Aortic Atherosclerosis (ICD10-I70.0) and Emphysema (ICD10-J43.9). Electronically Signed   By: Vinnie Langton M.D.   On: 11/24/2017 09:34

## 2017-12-09 ENCOUNTER — Inpatient Hospital Stay (HOSPITAL_BASED_OUTPATIENT_CLINIC_OR_DEPARTMENT_OTHER): Payer: Medicare Other | Admitting: Medical

## 2017-12-09 ENCOUNTER — Inpatient Hospital Stay: Payer: Medicare Other | Attending: Internal Medicine

## 2017-12-09 ENCOUNTER — Telehealth: Payer: Self-pay | Admitting: Medical Oncology

## 2017-12-09 ENCOUNTER — Ambulatory Visit (HOSPITAL_COMMUNITY)
Admission: RE | Admit: 2017-12-09 | Discharge: 2017-12-09 | Disposition: A | Discharge status: Home / Self Care | Payer: Medicare Other | Source: Ambulatory Visit | Attending: Medical | Admitting: Medical

## 2017-12-09 VITALS — BP 156/85 | HR 88 | Temp 97.9°F | Resp 20 | Wt 186.4 lb

## 2017-12-09 DIAGNOSIS — K5900 Constipation, unspecified: Secondary | ICD-10-CM | POA: Insufficient documentation

## 2017-12-09 DIAGNOSIS — C3490 Malignant neoplasm of unspecified part of unspecified bronchus or lung: Secondary | ICD-10-CM

## 2017-12-09 DIAGNOSIS — Z79899 Other long term (current) drug therapy: Secondary | ICD-10-CM

## 2017-12-09 DIAGNOSIS — C3432 Malignant neoplasm of lower lobe, left bronchus or lung: Secondary | ICD-10-CM

## 2017-12-09 DIAGNOSIS — E785 Hyperlipidemia, unspecified: Secondary | ICD-10-CM | POA: Diagnosis not present

## 2017-12-09 DIAGNOSIS — I10 Essential (primary) hypertension: Secondary | ICD-10-CM | POA: Insufficient documentation

## 2017-12-09 DIAGNOSIS — Z86718 Personal history of other venous thrombosis and embolism: Secondary | ICD-10-CM | POA: Diagnosis not present

## 2017-12-09 DIAGNOSIS — Z923 Personal history of irradiation: Secondary | ICD-10-CM | POA: Insufficient documentation

## 2017-12-09 DIAGNOSIS — Z86711 Personal history of pulmonary embolism: Secondary | ICD-10-CM | POA: Insufficient documentation

## 2017-12-09 DIAGNOSIS — K1230 Oral mucositis (ulcerative), unspecified: Secondary | ICD-10-CM | POA: Insufficient documentation

## 2017-12-09 DIAGNOSIS — R140 Abdominal distension (gaseous): Secondary | ICD-10-CM | POA: Diagnosis not present

## 2017-12-09 DIAGNOSIS — M1990 Unspecified osteoarthritis, unspecified site: Secondary | ICD-10-CM | POA: Diagnosis not present

## 2017-12-09 DIAGNOSIS — N281 Cyst of kidney, acquired: Secondary | ICD-10-CM | POA: Diagnosis not present

## 2017-12-09 DIAGNOSIS — Z96643 Presence of artificial hip joint, bilateral: Secondary | ICD-10-CM | POA: Insufficient documentation

## 2017-12-09 DIAGNOSIS — Z87891 Personal history of nicotine dependence: Secondary | ICD-10-CM | POA: Diagnosis not present

## 2017-12-09 LAB — COMPREHENSIVE METABOLIC PANEL
ALBUMIN: 2.5 g/dL — AB (ref 3.5–5.0)
ALT: 31 U/L (ref 0–55)
ANION GAP: 8 (ref 3–11)
AST: 29 U/L (ref 5–34)
Alkaline Phosphatase: 121 U/L (ref 40–150)
BILIRUBIN TOTAL: 0.5 mg/dL (ref 0.2–1.2)
BUN: 19 mg/dL (ref 7–26)
CO2: 29 mmol/L (ref 22–29)
Calcium: 9.1 mg/dL (ref 8.4–10.4)
Chloride: 102 mmol/L (ref 98–109)
Creatinine, Ser: 1.2 mg/dL (ref 0.70–1.30)
GFR calc Af Amer: >60 mL/min (ref >60)
GFR, EST NON AFRICAN AMERICAN: 59 mL/min — AB (ref >60)
GLUCOSE: 126 mg/dL (ref 70–140)
POTASSIUM: 4.8 mmol/L (ref 3.5–5.1)
Sodium: 139 mmol/L (ref 136–145)
TOTAL PROTEIN: 6.7 g/dL (ref 6.4–8.3)

## 2017-12-09 LAB — CBC WITH DIFFERENTIAL/PLATELET
BASOS PCT: 0 %
Basophils Absolute: 0 10*3/uL (ref 0.0–0.1)
Eosinophils Absolute: 0 10*3/uL (ref 0.0–0.5)
Eosinophils Relative: 0 %
HEMATOCRIT: 40.9 % (ref 38.4–49.9)
HEMOGLOBIN: 13.2 g/dL (ref 13.0–17.1)
LYMPHS ABS: 0.6 10*3/uL — AB (ref 0.9–3.3)
LYMPHS PCT: 4 %
MCH: 32.9 pg (ref 27.2–33.4)
MCHC: 32.3 g/dL (ref 32.0–36.0)
MCV: 101.9 fL — AB (ref 79.3–98.0)
MONO ABS: 1.3 10*3/uL — AB (ref 0.1–0.9)
MONOS PCT: 8 %
NEUTROS ABS: 14.3 10*3/uL — AB (ref 1.5–6.5)
Neutrophils Relative %: 88 %
Platelets: 75 10*3/uL — ABNORMAL LOW (ref 140–400)
RBC: 4.01 MIL/uL — ABNORMAL LOW (ref 4.20–5.82)
RDW: 16.9 % — AB (ref 11.0–15.6)
WBC: 16.2 10*3/uL — ABNORMAL HIGH (ref 4.0–10.3)

## 2017-12-09 NOTE — Telephone Encounter (Signed)
[facility] today for constipation -LBM  5 days ago.

## 2017-12-09 NOTE — Patient Instructions (Signed)
Magnesium Citrate, 1/2 bottle, take the remaining 1/2 bottle if no BM within 30 to 60 minutes.  30 ml of Milk of Magnesia into a small juice glass of prune juice, mix, then heat in microwave for 20 seconds  After resolving constipation  Colace or Senna-S 1 to 2 tablets twice daily  Plum juice   Prune juice  Push fluids

## 2017-12-10 NOTE — Progress Notes (Signed)
[facility] Progress Note   HAWK MONES [id number] [birth year] 72 y.o.  [name] is managed by Dr. Eilleen Kempf  Actively treated with chemotherapy: yes  Current Therapy: Taxotere and Cyramza with Neulasta support   Last Treated: 11/24/2017 (cycle 3, day 1)  Assessment: Plan:    Obstipation - Plan: DG Abd 1 View   Obstipation: Patient was referred for a KUB.  This returned showing moderate stool in the colon without evidence of an obstruction.  Magnesium Citrate, 1/2 bottle, take the remaining 1/2 bottle if no BM within 30 to 60 minutes.  30 ml of Milk of Magnesia into a small juice glass of prune juice, mix, then heat in microwave for 20 seconds  After resolving constipation  Colace or Senna-S 1 to 2 tablets twice daily  Plum juice   Prune juice  Push fluids  I talked to the patient's wife on 12/10/2017.  The patient was able to achieve a bowel movement after taking magnesium citrate.  Please see After Visit Summary for patient specific instructions.  Future Appointments  Date Time Provider [place]  12/16/2017  9:15 AM [facility] LAB 2 [facility] None  12/16/2017  9:30 AM [facility] J32 DNS [facility] None  12/16/2017 10:00 AM Curcio, Roselie Awkward, NP [facility] None  12/16/2017 11:00 AM [facility] G22 [facility] None  12/18/2017 11:00 AM [facility] INJ NURSE [facility] None  12/23/2017 11:15 AM [facility] LAB 2 [facility] None  12/30/2017 11:15 AM [facility] LAB 1 [facility] None  01/06/2018  9:45 AM [facility] LAB 3 [facility] None  01/06/2018 10:00 AM [facility] FLUSH NURSE [facility] None  01/06/2018 10:30 AM [name], MD [facility] None  01/06/2018 11:30 AM [facility] B4 [facility] None  01/13/2018 11:00 AM [facility] LAB 2 [facility] None  01/20/2018 11:00 AM [facility] LAB 2 [facility] None  01/27/2018  9:45 AM [facility] LAB 3 [facility] None  01/27/2018 10:00 AM [facility] J32 DNS [facility]  None  01/27/2018 10:30 AM [name], MD [facility] None  01/27/2018 11:30 AM [facility] G22 [facility] None  01/27/2018 12:00 PM Ernestene Kiel L, RD [facility] None  01/29/2018 11:00 AM [facility] INJ NURSE [facility] None    Orders Placed This Encounter  Procedures  . DG Abd 1 View       Subjective:   Patient ID:  Nicholas Hale is a 72 y.o. (DOB May 15, 1946) male.  Chief Complaint:  Chief Complaint  Patient presents with  . Constipation    HPI Nicholas Hale is a 72 year old male with a diagnosis of a stage IV (T1a, N3, M1b) non-small cell lung cancer, adenocarcinoma of the left lower lobe with bilateral mediastinal and supraclavicular lymphadenopathy as well as peritoneal lymphadenopathy and a malignant pericardial effusion.  His diagnosis dates to October 2017.  He has most recently received cycle 3-day 1 of Taxotere and Cyramza which was dosed on 11/24/2017 with Neulasta given on 11/26/2017.  He presents the office today with mucositis.  He had used viscous lidocaine for this however it caused him to develop large blisters over his tongue.  He continues to use liquid Roxicodone.  He has been drinking boost, smoothies, and soups due to his oral pain and swelling.  His dentures do not fit well due to the swelling of his gums.  He has had constipation for 5 days.  He has used warm prune juice without any relief.  His urine is dark in color.  He he has increased fatigue.  He denies abdominal pain, nausea, vomiting, fevers, chills, or  sweats.  Medications: I have reviewed the patient's current medications.  Allergies: No Known Allergies  Past Medical History:  Diagnosis Date  . Abdominal aortic aneurysm ([place])   . Arthritis    "hips; lower spine" (05/24/2014)  . Chemotherapy induced neutropenia ([place]) 03/31/2017  . Chronic fatigue 10/05/2016  . Encounter for antineoplastic chemotherapy 02/17/2017  . History of radiation therapy 10/05/17-10/25/17   left lung/chest 35 Gy in 14  fractions  . Hyperlipidemia   . Hypertension   . Lung cancer ([place]) 09/25/2016  . PE (pulmonary embolism) 9/15  . Pericardial effusion 09/20/2016   Malignant effusion s/p pericardial drain  . Personal history of DVT (deep vein thrombosis) 9/15    Past Surgical History:  Procedure Laterality Date  . ANTERIOR FUSION CERVICAL SPINE  ~ 2005  . APPENDECTOMY  1978  . CARDIAC CATHETERIZATION N/A 09/21/2016   Procedure: Pericardiocentesis;  Surgeon: Jettie Booze, MD;  Location: [facility];  Service: Cardiovascular;  Laterality: N/A;  . COLONOSCOPY    . HEMIARTHROPLASTY SHOULDER FRACTURE Left 12/2008  . INGUINAL HERNIA REPAIR Bilateral 1992  . IR GENERIC HISTORICAL  02/01/2017   IR FLUORO GUIDE PORT INSERTION RIGHT 02/01/2017 Arne Cleveland, MD [facility]  . IR GENERIC HISTORICAL  02/01/2017   IR US GUIDE VASC ACCESS RIGHT 02/01/2017 Arne Cleveland, MD [facility]  . JOINT REPLACEMENT    . PERICARDIAL FLUID DRAINAGE  09/21/2016  . TOTAL HIP ARTHROPLASTY Left 05/23/2014   Procedure: TOTAL HIP ARTHROPLASTY;  Surgeon: Kerin Salen, MD;  Location: [facility];  Service: Orthopedics;  Laterality: Left;  . TOTAL HIP ARTHROPLASTY Right 05/15/2015   Procedure: TOTAL HIP ARTHROPLASTY;  Surgeon: Frederik Pear, MD;  Location: [facility];  Service: Orthopedics;  Laterality: Right;    Family History  Problem Relation Age of Onset  . Clotting disorder Mother   . Diabetes Mellitus I Mother   . Prostate cancer Father     Social History   Socioeconomic History  . Marital status: Married    Spouse name: Not on file  . Number of children: Not on file  . Years of education: Not on file  . Highest education level: Not on file  Social Needs  . Financial resource strain: Not on file  . Food insecurity - worry: Not on file  . Food insecurity - inability: Not on file  . Transportation needs - medical: Not on file  . Transportation needs - non-medical: Not on file  Occupational History  . Not on file    Tobacco Use  . Smoking status: Former Smoker    Packs/day: 0.50    Years: 40.00    Pack years: 20.00    Types: Cigarettes    Last attempt to quit: 2010    Years since quitting: 9.0  . Smokeless tobacco: Never Used  Substance and Sexual Activity  . Alcohol use: No  . Drug use: No  . Sexual activity: No  Other Topics Concern  . Not on file  Social History Narrative  . Not on file    Past Medical History, Surgical history, Social history, and Family history were reviewed and updated as appropriate.   Please see review of systems for further details on the patient's review from today.   Review of Systems:  Review of Systems  Constitutional: Positive for appetite change and fatigue. Negative for chills, diaphoresis and fever.  HENT: Positive for mouth sores. Negative for trouble swallowing.   Respiratory: Negative for cough, choking and shortness of  breath.   Cardiovascular: Negative for chest pain, palpitations and leg swelling.  Gastrointestinal: Positive for constipation. Negative for abdominal pain, blood in stool, nausea and vomiting.    Objective:   Physical Exam:  BP (!) 156/85 (BP Location: Left Arm, Patient Position: Sitting)   Pulse 88   Temp 97.9 F (36.6 C) (Oral)   Resp 20   Wt 186 lb 6 oz (84.5 kg)   SpO2 100%   BMI 26.74 kg/m  ECOG: 1  Physical Exam  Constitutional: No distress.  HENT:  Head: Normocephalic and atraumatic.  Mouth/Throat: Posterior oropharyngeal erythema present. No oropharyngeal exudate.  Eyes: Right eye exhibits no discharge. Left eye exhibits no discharge. No scleral icterus.  Cardiovascular: Normal rate, regular rhythm and normal heart sounds. Exam reveals no gallop and no friction rub.  No murmur heard. Pulmonary/Chest: Effort normal and breath sounds normal. No respiratory distress. He has no wheezes. He has no rales.  Abdominal: Soft. Bowel sounds are normal. He exhibits no distension. There is no tenderness. There is no rebound  and no guarding.  Neurological: He is alert. Coordination (Mr. Ines is ambulating with the use of a wheelchair.) abnormal.  Skin: Skin is warm and dry. He is not diaphoretic.    Lab Review:     Component Value Date/Time   NA 139 12/09/2017 1124   NA 139 12/02/2017 1055   K 4.8 12/09/2017 1124   K 4.6 12/02/2017 1055   CL 102 12/09/2017 1124   CO2 29 12/09/2017 1124   CO2 26 12/02/2017 1055   GLUCOSE 126 12/09/2017 1124   GLUCOSE 117 12/02/2017 1055   BUN 19 12/09/2017 1124   BUN 31.8 (H) 12/02/2017 1055   CREATININE 1.20 12/09/2017 1124   CREATININE 1.6 (H) 12/02/2017 1055   CALCIUM 9.1 12/09/2017 1124   CALCIUM 9.4 12/02/2017 1055   PROT 6.7 12/09/2017 1124   PROT 7.6 12/02/2017 1055   ALBUMIN 2.5 (L) 12/09/2017 1124   ALBUMIN 3.1 (L) 12/02/2017 1055   AST 29 12/09/2017 1124   AST 22 12/02/2017 1055   ALT 31 12/09/2017 1124   ALT 34 12/02/2017 1055   ALKPHOS 121 12/09/2017 1124   ALKPHOS 84 12/02/2017 1055   BILITOT 0.5 12/09/2017 1124   BILITOT 0.77 12/02/2017 1055   GFRNONAA 59 (L) 12/09/2017 1124   GFRAA >60 12/09/2017 1124       Component Value Date/Time   WBC 16.2 (H) 12/09/2017 1124   RBC 4.01 (L) 12/09/2017 1124   HGB 13.2 12/09/2017 1124   HGB 15.8 12/02/2017 1055   HCT 40.9 12/09/2017 1124   HCT 48.4 12/02/2017 1055   PLT 75 (L) 12/09/2017 1124   PLT 48 (L) 12/02/2017 1055   MCV 101.9 (H) 12/09/2017 1124   MCV 102.8 (H) 12/02/2017 1055   MCH 32.9 12/09/2017 1124   MCHC 32.3 12/09/2017 1124   RDW 16.9 (H) 12/09/2017 1124   RDW 15.2 (H) 12/02/2017 1055   LYMPHSABS 0.6 (L) 12/09/2017 1124   LYMPHSABS 0.8 (L) 12/02/2017 1055   MONOABS 1.3 (H) 12/09/2017 1124   MONOABS 1.3 (H) 12/02/2017 1055   EOSABS 0.0 12/09/2017 1124   EOSABS 0.0 12/02/2017 1055   BASOSABS 0.0 12/09/2017 1124   BASOSABS 0.0 12/02/2017 1055   -------------------------------  Imaging from last 24 hours (if applicable):  Radiology interpretation: Dg Abd 1 View  Result  Date: 12/09/2017 CLINICAL DATA:  Abdominal distention.  Constipation. EXAM: ABDOMEN - 1 VIEW COMPARISON:  CT abdomen and pelvis November 24, 2017  FINDINGS: There is moderate stool in the colon. There is no appreciable bowel dilatation or air-fluid level to suggest bowel obstruction. No free air. There are soft tissue masses in the right mid abdomen which CT demonstrates as representing large right renal cysts. Lung bases are clear. There are small phleboliths in the pelvis. 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 IMPRESSION: No bowel obstruction or free air. Moderate stool in colon. Soft tissue masses right mid abdomen demonstrated by CT to represent large renal cysts. Total hip replacements bilaterally.  Lung bases clear. Electronically Signed   By: Lowella Grip III M.D.   On: 12/09/2017 14:05   Ct Chest W Contrast  Result Date: 11/24/2017 CLINICAL DATA:  72 year old male with history of lung cancer diagnosed in October 2017. Treated with immunotherapy. Previously treated with radiation therapy. EXAM: CT CHEST, ABDOMEN, AND PELVIS WITH CONTRAST TECHNIQUE: Multidetector CT imaging of the chest, abdomen and pelvis was performed following the standard protocol during bolus administration of intravenous contrast. CONTRAST:  134mL ISOVUE-300 IOPAMIDOL (ISOVUE-300) INJECTION 61% COMPARISON:  CT of the chest, abdomen and pelvis 09/20/2017. FINDINGS: CT CHEST FINDINGS Cardiovascular: Heart size is normal. There is no significant pericardial fluid, thickening or pericardial calcification. There is aortic atherosclerosis, as well as atherosclerosis of the great vessels of the mediastinum and the coronary arteries, including calcified atherosclerotic plaque in the left anterior descending and right coronary arteries. Mediastinum/Nodes: Compared to the prior examination, lymphadenopathy in the mediastinum has returned, with the largest mediastinal lymph node in the prevascular nodal station measuring up to 14  mm in short axis (axial image 21 of series 2), previously only 4 mm in short axis in the same position on 09/20/2017. 11 mm short axis superior mediastinal node just posterior to the proximal left subclavian artery (axial image 15 of series 2) increased compared to the prior examination. Several other prominent but nonenlarged prevascular lymph nodes are also noted. By comparison, confluence of enlarged lymph nodes in the left hilar region seen on the prior study has largely resolved, with the largest residual node in this region measuring only 9 mm in short axis (axial image 38 series 2). Esophagus is unremarkable in appearance. No axillary lymphadenopathy. Lungs/Pleura: Previously noted small nodule in the periphery of the left lower lobe has decreased in size, currently measuring 14 x 9 mm (axial image 98 of series 4) is as compared with 17 x 11 mm on the prior study. New nodularity noted along the inferior aspect of the left major fissure, with what appears to be some new consolidative opacity in the dependent portion of the left upper lobe abutting the major fissure, of uncertain etiology and significance. Diffuse bronchial wall thickening with mild to moderate centrilobular and paraseptal emphysema. Scattered areas of mild septal thickening, similar to the prior examination. No pleural effusions. Musculoskeletal: Orthopedic fixation hardware in the lower cervical spine incidentally noted. Orthopedic fixation hardware in left proximal humerus incompletely imaged. There are no aggressive appearing lytic or blastic lesions noted in the visualized portions of the skeleton. CT ABDOMEN PELVIS FINDINGS Hepatobiliary: 13 mm intermediate attenuation (31 HU) lesion in segment 4A of the liver is slightly smaller than prior studies, favored to represent an involuting cyst. No other suspicious appearing hepatic lesions. No intra or extrahepatic biliary ductal dilatation. 2 mm calcified gallstone in the neck of the  gallbladder. No findings to suggest an acute cholecystitis at this time. Pancreas: No pancreatic mass. No pancreatic ductal dilatation. No pancreatic or peripancreatic fluid or inflammatory changes. Spleen:  Unremarkable. Adrenals/Urinary Tract: Multiple low-attenuation lesions in the right kidney, compatible with simple cysts, the largest of which is exophytic extending off the anterior aspect of the lower pole measuring 11.4 cm. Several other subcentimeter low-attenuation lesions in both kidneys are too small to definitively characterize, but are favored to represent tiny cysts. Bilateral adrenal glands are normal in appearance. No hydroureteronephrosis. Urinary bladder is largely obscured by beam hardening artifact from bilateral hip arthroplasties. Stomach/Bowel: Normal appearance of the stomach. No pathologic dilatation of small bowel or colon. Numerous colonic diverticulae are noted, without surrounding inflammatory changes to suggest an acute diverticulitis at this time. The appendix is not confidently identified and may be surgically absent. Regardless, there are no inflammatory changes noted adjacent to the cecum to suggest the presence of an acute appendicitis at this time. Vascular/Lymphatic: Extensive aortic atherosclerosis with fusiform aneurysmal dilatation of the infrarenal abdominal aorta which measures up to 3.8 x 3.3 cm. 10 mm aortocaval lymph node (axial image 78 of series 2) is unchanged and nonspecific. No other lymphadenopathy noted elsewhere in the abdomen or pelvis. Reproductive: Prostate gland and seminal vesicles are unremarkable in appearance. Other: No significant volume of ascites.  No pneumoperitoneum. Musculoskeletal: There are no aggressive appearing lytic or blastic lesions noted in the visualized portions of the skeleton. IMPRESSION: 1. Today's study demonstrates a mixed response to therapy. Specifically, the recurrence in the left hilar region appears significantly improved when  compared to the prior examination, however, there is progressive nodal disease in the anterior and superior mediastinum, and marked worsening official nodularity along the left major fissure which raises concern for developing lymphangitic spread of disease. 2. No evidence of metastatic disease to the abdomen or pelvis. 3. Aortic atherosclerosis, in addition to 2 vessel coronary artery disease. Assessment for potential risk factor modification, dietary therapy or pharmacologic therapy may be warranted, if clinically indicated. 4. In addition, there is fusiform aneurysmal dilatation of the infrarenal abdominal aorta which measures up to 3.8 x 3.3 cm, similar to the prior study. Recommend followup by ultrasound in 2 years. This recommendation follows ACR consensus guidelines: White Paper of the ACR Incidental Findings Committee II on Vascular Findings. J Am Coll Radiol 2013; 10:789-794. 5. Colonic diverticulosis without evidence of acute diverticulitis at this time. 6. Cholelithiasis without evidence of acute cholecystitis. 7. Diffuse bronchial wall thickening with mild to moderate centrilobular and paraseptal emphysema; imaging findings suggestive of underlying COPD. 8. Additional incidental findings, as above. Aortic Atherosclerosis (ICD10-I70.0) and Emphysema (ICD10-J43.9). Electronically Signed   By: Vinnie Langton M.D.   On: 11/24/2017 09:34   Ct Abdomen Pelvis W Contrast  Result Date: 11/24/2017 CLINICAL DATA:  72 year old male with history of lung cancer diagnosed in October 2017. Treated with immunotherapy. Previously treated with radiation therapy. EXAM: CT CHEST, ABDOMEN, AND PELVIS WITH CONTRAST TECHNIQUE: Multidetector CT imaging of the chest, abdomen and pelvis was performed following the standard protocol during bolus administration of intravenous contrast. CONTRAST:  145mL ISOVUE-300 IOPAMIDOL (ISOVUE-300) INJECTION 61% COMPARISON:  CT of the chest, abdomen and pelvis 09/20/2017. FINDINGS: CT  CHEST FINDINGS Cardiovascular: Heart size is normal. There is no significant pericardial fluid, thickening or pericardial calcification. There is aortic atherosclerosis, as well as atherosclerosis of the great vessels of the mediastinum and the coronary arteries, including calcified atherosclerotic plaque in the left anterior descending and right coronary arteries. Mediastinum/Nodes: Compared to the prior examination, lymphadenopathy in the mediastinum has returned, with the largest mediastinal lymph node in the prevascular nodal station measuring up to 14  mm in short axis (axial image 21 of series 2), previously only 4 mm in short axis in the same position on 09/20/2017. 11 mm short axis superior mediastinal node just posterior to the proximal left subclavian artery (axial image 15 of series 2) increased compared to the prior examination. Several other prominent but nonenlarged prevascular lymph nodes are also noted. By comparison, confluence of enlarged lymph nodes in the left hilar region seen on the prior study has largely resolved, with the largest residual node in this region measuring only 9 mm in short axis (axial image 38 series 2). Esophagus is unremarkable in appearance. No axillary lymphadenopathy. Lungs/Pleura: Previously noted small nodule in the periphery of the left lower lobe has decreased in size, currently measuring 14 x 9 mm (axial image 98 of series 4) is as compared with 17 x 11 mm on the prior study. New nodularity noted along the inferior aspect of the left major fissure, with what appears to be some new consolidative opacity in the dependent portion of the left upper lobe abutting the major fissure, of uncertain etiology and significance. Diffuse bronchial wall thickening with mild to moderate centrilobular and paraseptal emphysema. Scattered areas of mild septal thickening, similar to the prior examination. No pleural effusions. Musculoskeletal: Orthopedic fixation hardware in the lower  cervical spine incidentally noted. Orthopedic fixation hardware in left proximal humerus incompletely imaged. There are no aggressive appearing lytic or blastic lesions noted in the visualized portions of the skeleton. CT ABDOMEN PELVIS FINDINGS Hepatobiliary: 13 mm intermediate attenuation (31 HU) lesion in segment 4A of the liver is slightly smaller than prior studies, favored to represent an involuting cyst. No other suspicious appearing hepatic lesions. No intra or extrahepatic biliary ductal dilatation. 2 mm calcified gallstone in the neck of the gallbladder. No findings to suggest an acute cholecystitis at this time. Pancreas: No pancreatic mass. No pancreatic ductal dilatation. No pancreatic or peripancreatic fluid or inflammatory changes. Spleen: Unremarkable. Adrenals/Urinary Tract: Multiple low-attenuation lesions in the right kidney, compatible with simple cysts, the largest of which is exophytic extending off the anterior aspect of the lower pole measuring 11.4 cm. Several other subcentimeter low-attenuation lesions in both kidneys are too small to definitively characterize, but are favored to represent tiny cysts. Bilateral adrenal glands are normal in appearance. No hydroureteronephrosis. Urinary bladder is largely obscured by beam hardening artifact from bilateral hip arthroplasties. Stomach/Bowel: Normal appearance of the stomach. No pathologic dilatation of small bowel or colon. Numerous colonic diverticulae are noted, without surrounding inflammatory changes to suggest an acute diverticulitis at this time. The appendix is not confidently identified and may be surgically absent. Regardless, there are no inflammatory changes noted adjacent to the cecum to suggest the presence of an acute appendicitis at this time. Vascular/Lymphatic: Extensive aortic atherosclerosis with fusiform aneurysmal dilatation of the infrarenal abdominal aorta which measures up to 3.8 x 3.3 cm. 10 mm aortocaval lymph node  (axial image 78 of series 2) is unchanged and nonspecific. No other lymphadenopathy noted elsewhere in the abdomen or pelvis. Reproductive: Prostate gland and seminal vesicles are unremarkable in appearance. Other: No significant volume of ascites.  No pneumoperitoneum. Musculoskeletal: There are no aggressive appearing lytic or blastic lesions noted in the visualized portions of the skeleton. IMPRESSION: 1. Today's study demonstrates a mixed response to therapy. Specifically, the recurrence in the left hilar region appears significantly improved when compared to the prior examination, however, there is progressive nodal disease in the anterior and superior mediastinum, and marked worsening official nodularity  along the left major fissure which raises concern for developing lymphangitic spread of disease. 2. No evidence of metastatic disease to the abdomen or pelvis. 3. Aortic atherosclerosis, in addition to 2 vessel coronary artery disease. Assessment for potential risk factor modification, dietary therapy or pharmacologic therapy may be warranted, if clinically indicated. 4. In addition, there is fusiform aneurysmal dilatation of the infrarenal abdominal aorta which measures up to 3.8 x 3.3 cm, similar to the prior study. Recommend followup by ultrasound in 2 years. This recommendation follows ACR consensus guidelines: White Paper of the ACR Incidental Findings Committee II on Vascular Findings. J Am Coll Radiol 2013; 10:789-794. 5. Colonic diverticulosis without evidence of acute diverticulitis at this time. 6. Cholelithiasis without evidence of acute cholecystitis. 7. Diffuse bronchial wall thickening with mild to moderate centrilobular and paraseptal emphysema; imaging findings suggestive of underlying COPD. 8. Additional incidental findings, as above. Aortic Atherosclerosis (ICD10-I70.0) and Emphysema (ICD10-J43.9). Electronically Signed   By: Vinnie Langton M.D.   On: 11/24/2017 09:34

## 2017-12-14 ENCOUNTER — Emergency Department (HOSPITAL_COMMUNITY): Payer: Medicare Other

## 2017-12-14 ENCOUNTER — Encounter (HOSPITAL_COMMUNITY): Payer: Self-pay | Admitting: Emergency Medicine

## 2017-12-14 ENCOUNTER — Telehealth: Payer: Self-pay | Admitting: Medical Oncology

## 2017-12-14 ENCOUNTER — Inpatient Hospital Stay (HOSPITAL_COMMUNITY)
Admission: EM | Admit: 2017-12-14 | Discharge: 2017-12-24 | DRG: 314 | Disposition: A | Discharge status: Home Health | Payer: Medicare Other | Attending: Internal Medicine | Admitting: Internal Medicine

## 2017-12-14 DIAGNOSIS — R05 Cough: Secondary | ICD-10-CM | POA: Diagnosis not present

## 2017-12-14 DIAGNOSIS — I10 Essential (primary) hypertension: Secondary | ICD-10-CM | POA: Diagnosis present

## 2017-12-14 DIAGNOSIS — Z86711 Personal history of pulmonary embolism: Secondary | ICD-10-CM | POA: Diagnosis not present

## 2017-12-14 DIAGNOSIS — D72828 Other elevated white blood cell count: Secondary | ICD-10-CM | POA: Diagnosis not present

## 2017-12-14 DIAGNOSIS — Z833 Family history of diabetes mellitus: Secondary | ICD-10-CM

## 2017-12-14 DIAGNOSIS — R7881 Bacteremia: Secondary | ICD-10-CM | POA: Diagnosis not present

## 2017-12-14 DIAGNOSIS — C799 Secondary malignant neoplasm of unspecified site: Secondary | ICD-10-CM | POA: Diagnosis present

## 2017-12-14 DIAGNOSIS — Z87891 Personal history of nicotine dependence: Secondary | ICD-10-CM | POA: Diagnosis not present

## 2017-12-14 DIAGNOSIS — E785 Hyperlipidemia, unspecified: Secondary | ICD-10-CM | POA: Diagnosis not present

## 2017-12-14 DIAGNOSIS — R531 Weakness: Secondary | ICD-10-CM | POA: Diagnosis not present

## 2017-12-14 DIAGNOSIS — J15211 Pneumonia due to Methicillin susceptible Staphylococcus aureus: Secondary | ICD-10-CM | POA: Diagnosis present

## 2017-12-14 DIAGNOSIS — D72829 Elevated white blood cell count, unspecified: Secondary | ICD-10-CM | POA: Diagnosis not present

## 2017-12-14 DIAGNOSIS — D7589 Other specified diseases of blood and blood-forming organs: Secondary | ICD-10-CM | POA: Diagnosis present

## 2017-12-14 DIAGNOSIS — A419 Sepsis, unspecified organism: Secondary | ICD-10-CM | POA: Diagnosis not present

## 2017-12-14 DIAGNOSIS — K1230 Oral mucositis (ulcerative), unspecified: Secondary | ICD-10-CM | POA: Diagnosis present

## 2017-12-14 DIAGNOSIS — Z7901 Long term (current) use of anticoagulants: Secondary | ICD-10-CM | POA: Diagnosis not present

## 2017-12-14 DIAGNOSIS — E440 Moderate protein-calorie malnutrition: Secondary | ICD-10-CM | POA: Diagnosis not present

## 2017-12-14 DIAGNOSIS — J189 Pneumonia, unspecified organism: Secondary | ICD-10-CM | POA: Diagnosis not present

## 2017-12-14 DIAGNOSIS — T451X5A Adverse effect of antineoplastic and immunosuppressive drugs, initial encounter: Secondary | ICD-10-CM | POA: Diagnosis present

## 2017-12-14 DIAGNOSIS — D6959 Other secondary thrombocytopenia: Secondary | ICD-10-CM | POA: Diagnosis present

## 2017-12-14 DIAGNOSIS — Y832 Surgical operation with anastomosis, bypass or graft as the cause of abnormal reaction of the patient, or of later complication, without mention of misadventure at the time of the procedure: Secondary | ICD-10-CM | POA: Diagnosis present

## 2017-12-14 DIAGNOSIS — Z923 Personal history of irradiation: Secondary | ICD-10-CM | POA: Diagnosis not present

## 2017-12-14 DIAGNOSIS — Y95 Nosocomial condition: Secondary | ICD-10-CM | POA: Diagnosis present

## 2017-12-14 DIAGNOSIS — D649 Anemia, unspecified: Secondary | ICD-10-CM | POA: Diagnosis present

## 2017-12-14 DIAGNOSIS — Z452 Encounter for adjustment and management of vascular access device: Secondary | ICD-10-CM | POA: Diagnosis not present

## 2017-12-14 DIAGNOSIS — Z86718 Personal history of other venous thrombosis and embolism: Secondary | ICD-10-CM

## 2017-12-14 DIAGNOSIS — T80211A Bloodstream infection due to central venous catheter, initial encounter: Principal | ICD-10-CM | POA: Diagnosis present

## 2017-12-14 DIAGNOSIS — Z8042 Family history of malignant neoplasm of prostate: Secondary | ICD-10-CM

## 2017-12-14 DIAGNOSIS — R040 Epistaxis: Secondary | ICD-10-CM | POA: Diagnosis not present

## 2017-12-14 DIAGNOSIS — I361 Nonrheumatic tricuspid (valve) insufficiency: Secondary | ICD-10-CM | POA: Diagnosis not present

## 2017-12-14 DIAGNOSIS — Z96643 Presence of artificial hip joint, bilateral: Secondary | ICD-10-CM | POA: Diagnosis present

## 2017-12-14 DIAGNOSIS — K219 Gastro-esophageal reflux disease without esophagitis: Secondary | ICD-10-CM | POA: Diagnosis present

## 2017-12-14 DIAGNOSIS — Z9221 Personal history of antineoplastic chemotherapy: Secondary | ICD-10-CM

## 2017-12-14 DIAGNOSIS — R0602 Shortness of breath: Secondary | ICD-10-CM | POA: Diagnosis not present

## 2017-12-14 DIAGNOSIS — K5900 Constipation, unspecified: Secondary | ICD-10-CM | POA: Diagnosis present

## 2017-12-14 DIAGNOSIS — D630 Anemia in neoplastic disease: Secondary | ICD-10-CM | POA: Diagnosis present

## 2017-12-14 DIAGNOSIS — C3492 Malignant neoplasm of unspecified part of left bronchus or lung: Secondary | ICD-10-CM | POA: Diagnosis present

## 2017-12-14 DIAGNOSIS — Z85118 Personal history of other malignant neoplasm of bronchus and lung: Secondary | ICD-10-CM | POA: Diagnosis not present

## 2017-12-14 DIAGNOSIS — A4101 Sepsis due to Methicillin susceptible Staphylococcus aureus: Secondary | ICD-10-CM | POA: Diagnosis present

## 2017-12-14 DIAGNOSIS — T80219S Unspecified infection due to central venous catheter, sequela: Secondary | ICD-10-CM | POA: Diagnosis not present

## 2017-12-14 DIAGNOSIS — J181 Lobar pneumonia, unspecified organism: Secondary | ICD-10-CM | POA: Diagnosis not present

## 2017-12-14 DIAGNOSIS — E871 Hypo-osmolality and hyponatremia: Secondary | ICD-10-CM | POA: Diagnosis present

## 2017-12-14 DIAGNOSIS — B9561 Methicillin susceptible Staphylococcus aureus infection as the cause of diseases classified elsewhere: Secondary | ICD-10-CM

## 2017-12-14 DIAGNOSIS — I2699 Other pulmonary embolism without acute cor pulmonale: Secondary | ICD-10-CM | POA: Diagnosis not present

## 2017-12-14 DIAGNOSIS — R079 Chest pain, unspecified: Secondary | ICD-10-CM | POA: Diagnosis not present

## 2017-12-14 DIAGNOSIS — C3490 Malignant neoplasm of unspecified part of unspecified bronchus or lung: Secondary | ICD-10-CM | POA: Diagnosis not present

## 2017-12-14 LAB — BASIC METABOLIC PANEL
Anion gap: 10 (ref 5–15)
BUN: 27 mg/dL — ABNORMAL HIGH (ref 6–20)
CHLORIDE: 100 mmol/L — AB (ref 101–111)
CO2: 23 mmol/L (ref 22–32)
Calcium: 8 mg/dL — ABNORMAL LOW (ref 8.9–10.3)
Creatinine, Ser: 1.17 mg/dL (ref 0.61–1.24)
GFR calc non Af Amer: >60 mL/min (ref >60)
GLUCOSE: 142 mg/dL — AB (ref 65–99)
Potassium: 4 mmol/L (ref 3.5–5.1)
Sodium: 133 mmol/L — ABNORMAL LOW (ref 135–145)

## 2017-12-14 LAB — CBC
HEMATOCRIT: 39.6 % (ref 39.0–52.0)
HEMOGLOBIN: 12.9 g/dL — AB (ref 13.0–17.0)
MCH: 33.9 pg (ref 26.0–34.0)
MCHC: 32.6 g/dL (ref 30.0–36.0)
MCV: 104.2 fL — AB (ref 78.0–100.0)
Platelets: 103 10*3/uL — ABNORMAL LOW (ref 150–400)
RBC: 3.8 MIL/uL — AB (ref 4.22–5.81)
RDW: 16.9 % — ABNORMAL HIGH (ref 11.5–15.5)
WBC: 24.1 10*3/uL — ABNORMAL HIGH (ref 4.0–10.5)

## 2017-12-14 LAB — INFLUENZA PANEL BY PCR (TYPE A & B)
INFLAPCR: NEGATIVE
Influenza B By PCR: NEGATIVE

## 2017-12-14 LAB — CBG MONITORING, ED: GLUCOSE-CAPILLARY: 141 mg/dL — AB (ref 65–99)

## 2017-12-14 LAB — URINALYSIS, ROUTINE W REFLEX MICROSCOPIC
BACTERIA UA: NONE SEEN
BILIRUBIN URINE: NEGATIVE
Glucose, UA: NEGATIVE mg/dL
Hgb urine dipstick: NEGATIVE
KETONES UR: NEGATIVE mg/dL
Leukocytes, UA: NEGATIVE
Nitrite: NEGATIVE
PH: 5 (ref 5.0–8.0)
PROTEIN: 100 mg/dL — AB
SQUAMOUS EPITHELIAL / LPF: NONE SEEN
Specific Gravity, Urine: 1.017 (ref 1.005–1.030)

## 2017-12-14 LAB — DIFFERENTIAL
BASOS ABS: 0 10*3/uL (ref 0.0–0.1)
BASOS PCT: 0 %
EOS PCT: 0 %
Eosinophils Absolute: 0 10*3/uL (ref 0.0–0.7)
LYMPHS ABS: 1.9 10*3/uL (ref 0.7–4.0)
Lymphocytes Relative: 8 %
Monocytes Absolute: 2.2 10*3/uL — ABNORMAL HIGH (ref 0.1–1.0)
Monocytes Relative: 9 %
Neutro Abs: 20 10*3/uL — ABNORMAL HIGH (ref 1.7–7.7)
Neutrophils Relative %: 83 %

## 2017-12-14 LAB — HEPATIC FUNCTION PANEL
ALT: 81 U/L — AB (ref 17–63)
AST: 87 U/L — ABNORMAL HIGH (ref 15–41)
Albumin: 2.5 g/dL — ABNORMAL LOW (ref 3.5–5.0)
Alkaline Phosphatase: 118 U/L (ref 38–126)
BILIRUBIN DIRECT: 0.1 mg/dL (ref 0.1–0.5)
Indirect Bilirubin: 0.4 mg/dL (ref 0.3–0.9)
Total Bilirubin: 0.5 mg/dL (ref 0.3–1.2)
Total Protein: 6.9 g/dL (ref 6.5–8.1)

## 2017-12-14 LAB — TROPONIN I

## 2017-12-14 LAB — D-DIMER, QUANTITATIVE: D-Dimer, Quant: 2.79 ug/mL-FEU — ABNORMAL HIGH (ref 0.00–0.50)

## 2017-12-14 LAB — I-STAT CG4 LACTIC ACID, ED: Lactic Acid, Venous: 1.89 mmol/L (ref 0.5–1.9)

## 2017-12-14 MED ORDER — SODIUM CHLORIDE 0.9 % IV SOLN
1500.0000 mg | INTRAVENOUS | Status: DC
  Filled 2017-12-14: qty 1500

## 2017-12-14 MED ORDER — SODIUM CHLORIDE 0.9 % IV BOLUS (SEPSIS)
500.0000 mL | Freq: Once | INTRAVENOUS | Status: AC
  Administered 2017-12-14: 500 mL via INTRAVENOUS

## 2017-12-14 MED ORDER — VANCOMYCIN HCL 10 G IV SOLR
1500.0000 mg | Freq: Once | INTRAVENOUS | Status: AC
  Administered 2017-12-14: 1500 mg via INTRAVENOUS
  Filled 2017-12-14: qty 1500

## 2017-12-14 MED ORDER — DEXTROSE 5 % IV SOLN
2.0000 g | Freq: Once | INTRAVENOUS | Status: AC
  Administered 2017-12-14: 2 g via INTRAVENOUS
  Filled 2017-12-14: qty 2

## 2017-12-14 MED ORDER — IOPAMIDOL (ISOVUE-370) INJECTION 76%
INTRAVENOUS | Status: AC
  Administered 2017-12-14: 100 mL
  Filled 2017-12-14: qty 100

## 2017-12-14 MED ORDER — SODIUM CHLORIDE 0.9 % IV BOLUS (SEPSIS)
1000.0000 mL | Freq: Once | INTRAVENOUS | Status: AC
  Administered 2017-12-14: 1000 mL via INTRAVENOUS

## 2017-12-14 NOTE — ED Notes (Signed)
BLOOD CULTURE X 1  5ML EACH [facility] PORT OBTAINED

## 2017-12-14 NOTE — Telephone Encounter (Signed)
Wife reports pt has profound weakness , stays cold all the time,not eating well, some confusion. I told her to get pt to ED via car or EMS.

## 2017-12-14 NOTE — ED Notes (Signed)
Lab called first set blood culture in lab prior to 1900 second set obtained 2145 sent to lab

## 2017-12-14 NOTE — Progress Notes (Signed)
Consults were received from an ED physician for vancomycin and cefepime per pharmacy dosing.  The patient's profile has been reviewed for ht/wt/allergies/indication/available labs.   One time orders have been placed for cefepime 2gm x 1 and vancomycin 1500mg  IV x 1.   Further antibiotics/pharmacy consults should be ordered by admitting physician if indicated.                       Thank you, Clovis Riley 12/14/2017  8:42 PM

## 2017-12-14 NOTE — ED Notes (Signed)
ED Provider at bedside. [place]

## 2017-12-14 NOTE — ED Triage Notes (Signed)
Pt comes in with complaints of increased weakness over the past few days.  States he had chemo 3 weeks ago and since then he has lost 15 lbs.  Has reportedly had multiple reactions to the chemo.  They called Dr. Rogue Jury before coming here to make him aware.  (IE. Swollen mouth, sore throat).  Has not been able to eat solids. Being treated for stage 4 lung cancer. Afebrile on exam.

## 2017-12-14 NOTE — Progress Notes (Signed)
Pharmacy Antibiotic Note  Nicholas Hale is a 72 y.o. male admitted on 12/14/2017 with pneumonia.  Pharmacy has been consulted for vancomycin  dosing.  Plan:  Vancomycin 1500mg  IV q24h  Cefepime ordered 1gm q8h per MD (dose correct for renal function)  Follow renal function  Follow cultures, consider checking MRSA PCR     Temp (24hrs), Avg:97.4 F (36.3 C), Min:97.4 F (36.3 C), Max:97.4 F (36.3 C)  Recent Labs  Lab 12/09/17 1124 12/14/17 1540  WBC 16.2* 24.1*  CREATININE 1.20 1.17    Estimated Creatinine Clearance: 59.8 mL/min (by C-G formula based on SCr of 1.17 mg/dL).    Allergies  Allergen Reactions  . Lidocaine Swelling    "Hard white blisters" formed on tongue, swollen mouth    Antimicrobials this admission: 1/15 vanco >> 1/15 cefepime >>  Dose adjustments this admission:  Microbiology results: 1/15 BCx:  1/15 flu PCR: 1/15 Strep Ag: 1/15 Legionella Ag:   Sputum: ordered   Thank you for allowing pharmacy to be a part of this patient's care.  Doreene Eland, PharmD, BCPS.   Pager: [phone] 12/14/2017 9:40 PM

## 2017-12-14 NOTE — H&P (Signed)
TRH H&P   Patient Demographics:    Nicholas Hale, is a 72 y.o. male  MRN: [id number]   DOB - 1946-03-10  Admit Date - 12/14/2017  Outpatient Primary MD for the patient is Rankins, [name], MD  Referring MD/NP/PA:   Shari Prows  Outpatient Specialists:  [name]  Patient coming from: home  Chief Complaint  Patient presents with  . Weakness  . Weight Loss      HPI:    Nicholas Hale  is a 72 y.o. male, hypertension, hyperlipidemia, h/o PE/DVT, lung cancer (stage 4, nonsmall cell ) , pericardial effusion (malignant), apparently c/o increasing dyspnea over the past several days as well as cough with productive sputum.  Pt denies fever, chills, cp, palp, n/v, diarrhea, brbpr.  Pt presented to ED due to dyspnea.   In ED,  Wbc 24.1, hgb 12.9, Plt 103 Bun 27, Creatinine 1.17 Trop <0.03 Glucose 142  CT scan chest=> IMPRESSION: 1. Suboptimal exam limiting assessment for smaller pulmonary emboli. Allowing for this, there is no evidence of a pulmonary embolus. 2. Extensive left lower lobe consolidation consistent with pneumonia. 3. Findings of metastatic carcinoma noted on the prior chest, abdomen and pelvis CT are similar.  Pt will be admitted for pneumonia Hcap   Review of systems:    In addition to the HPI above,  No Fever-chills, No Headache, No changes with Vision or hearing, No problems swallowing food or Liquids, No Chest pain No Abdominal pain, No Nausea or Vommitting, Bowel movements are regular, No Blood in stool or Urine, No dysuria, No new skin rashes or bruises, No new joints pains-aches,  No new weakness, tingling, numbness in any extremity, No recent weight gain or loss, No polyuria, polydypsia or polyphagia, No significant Mental Stressors.  A full 10 point Review of Systems was done, except as stated above, all other Review of Systems  were negative.   With Past History of the following :    Past Medical History:  Diagnosis Date  . Abdominal aortic aneurysm ([place])   . Arthritis    "hips; lower spine" (05/24/2014)  . Chemotherapy induced neutropenia ([place]) 03/31/2017  . Chronic fatigue 10/05/2016  . Encounter for antineoplastic chemotherapy 02/17/2017  . History of radiation therapy 10/05/17-10/25/17   left lung/chest 35 Gy in 14 fractions  . Hyperlipidemia   . Hypertension   . Lung cancer ([place]) 09/25/2016  . PE (pulmonary embolism) 9/15  . Pericardial effusion 09/20/2016   Malignant effusion s/p pericardial drain  . Personal history of DVT (deep vein thrombosis) 9/15      Past Surgical History:  Procedure Laterality Date  . ANTERIOR FUSION CERVICAL SPINE  ~ 2005  . APPENDECTOMY  1978  . CARDIAC CATHETERIZATION N/A 09/21/2016   Procedure: Pericardiocentesis;  Surgeon: Jettie Booze, MD;  Location: [facility];  Service: Cardiovascular;  Laterality: N/A;  . COLONOSCOPY    .  HEMIARTHROPLASTY SHOULDER FRACTURE Left 12/2008  . INGUINAL HERNIA REPAIR Bilateral 1992  . IR GENERIC HISTORICAL  02/01/2017   IR FLUORO GUIDE PORT INSERTION RIGHT 02/01/2017 Arne Cleveland, MD [facility]  . IR GENERIC HISTORICAL  02/01/2017   IR US GUIDE VASC ACCESS RIGHT 02/01/2017 Arne Cleveland, MD [facility]  . JOINT REPLACEMENT    . PERICARDIAL FLUID DRAINAGE  09/21/2016  . TOTAL HIP ARTHROPLASTY Left 05/23/2014   Procedure: TOTAL HIP ARTHROPLASTY;  Surgeon: Kerin Salen, MD;  Location: [facility];  Service: Orthopedics;  Laterality: Left;  . TOTAL HIP ARTHROPLASTY Right 05/15/2015   Procedure: TOTAL HIP ARTHROPLASTY;  Surgeon: Frederik Pear, MD;  Location: [facility];  Service: Orthopedics;  Laterality: Right;      Social History:     Social History   Tobacco Use  . Smoking status: Former Smoker    Packs/day: 0.50    Years: 40.00    Pack years: 20.00    Types: Cigarettes    Last attempt to quit: 2010    Years since quitting:  9.0  . Smokeless tobacco: Never Used  Substance Use Topics  . Alcohol use: No     Lives - at home  Mobility - walks by self   Family History :     Family History  Problem Relation Age of Onset  . Clotting disorder Mother   . Diabetes Mellitus I Mother   . Prostate cancer Father       Home Medications:   Prior to Admission medications   Medication Sig Start Date End Date Taking? Authorizing Provider  albuterol (PROVENTIL HFA;VENTOLIN HFA) 108 (90 Base) MCG/ACT inhaler Inhale 2 puffs into the lungs every 4 (four) hours as needed for wheezing or shortness of breath. 07/27/17  Yes Byrum, Rose Fillers, MD  atorvastatin (LIPITOR) 20 MG tablet TAKE 1 TABLET BY MOUTH EVERY DAY AT 6PM 10/12/17  Yes End, Harrell Gave, MD  Dextromethorphan-Guaifenesin (MUCINEX DM PO) Take 20 mLs by mouth 2 (two) times daily as needed (CHEST CONGESTION).   Yes [provider]  diphenhydrAMINE (BENADRYL) 25 MG tablet Take 509 mg by mouth at bedtime as needed for sleep.   Yes [provider]  lidocaine (XYLOCAINE) 2 % solution Use as directed 5 mLs in the mouth or throat every 3 (three) hours as needed for mouth pain. 12/02/17  Yes Tanner, Lyndon Code., PA-C  lidocaine-prilocaine (EMLA) cream Apply 1 application topically as needed (on port site).    Yes [provider]  magic mouthwash SOLN Take 5 mLs by mouth 4 (four) times daily. Prednisone 80 mg , Nystatin 30 ml, Benadryl 12.5 mg/5 ml. Mix to 240 ml, 1:1:1 concentration. 11/24/17  Yes [name], MD  oxyCODONE (ROXICODONE) 5 MG/5ML solution Take 5 mLs (5 mg total) by mouth every 4 (four) hours as needed for severe pain. 12/02/17  Yes Tanner, Lyndon Code., PA-C  sodium chloride (OCEAN) 0.65 % SOLN nasal spray Place 2 sprays into both nostrils as needed for congestion.   Yes [provider]  sucralfate (CARAFATE) 1 g tablet Take 1 tablet (1 g total) by mouth 4 (four) times daily -  with meals and at bedtime. Crush and dissolve in 10 cc of  water prior to swallowing 10/25/17  Yes Gery Pray, MD  XARELTO 20 MG TABS tablet TAKE 1 TABLET BY MOUTH EVERY DAY 11/17/17  Yes Byrum, Rose Fillers, MD  chlorpheniramine-HYDROcodone (TUSSIONEX) 10-8 MG/5ML SUER Take 5 mLs at bedtime as needed by mouth for cough. Patient  not taking: Reported on 12/14/2017 10/12/17   Gery Pray, MD  dexamethasone (DECADRON) 4 MG tablet 4 mg by mouth twice a day the day before, day of and day after the chemotherapy every 3 weeks. 02/17/17   [name], MD  oxyCODONE-acetaminophen (PERCOCET/ROXICET) 5-325 MG tablet Take 1 tablet by mouth every 4 (four) hours as needed for severe pain. Patient not taking: Reported on 12/02/2017 10/25/17   Gery Pray, MD  prochlorperazine (COMPAZINE) 10 MG tablet Take 1 tablet (10 mg total) by mouth every 6 (six) hours as needed for nausea or vomiting. Patient not taking: Reported on 12/02/2017 11/24/17   [name], MD     Allergies:     Allergies  Allergen Reactions  . Lidocaine Swelling    "Hard white blisters" formed on tongue, swollen mouth     Physical Exam:   Vitals  Blood pressure 123/67, pulse 97, temperature (!) 97.4 F (36.3 C), temperature source Oral, resp. rate 17, SpO2 95 %.   1. General  lying in bed in NAD,    2. Normal affect and insight, Not Suicidal or Homicidal, Awake Alert, Oriented X 3.  3. No F.N deficits, ALL C.Nerves Intact, Strength 5/5 all 4 extremities, Sensation intact all 4 extremities, Plantars down going.  4. Ears and Eyes appear Normal, Conjunctivae clear, PERRLA. Moist Oral Mucosa.  5. Supple Neck, No JVD, No cervical lymphadenopathy appriciated, No Carotid Bruits.  6. Symmetrical Chest wall movement, Good air movement bilaterally, crackles left lower lung, no wheezing.  7. RRR, No Gallops, Rubs or Murmurs, No Parasternal Heave.  8. Positive Bowel Sounds, Abdomen Soft, No tenderness, No organomegaly appriciated,No rebound -guarding or rigidity.  9.  No Cyanosis,  Normal Skin Turgor, No Skin Rash or Bruise.  10. Good muscle tone,  joints appear normal , no effusions, Normal ROM.  11. No Palpable Lymph Nodes in Neck or Axillae     Data Review:    CBC Recent Labs  Lab 12/09/17 1124 12/14/17 1540  WBC 16.2* 24.1*  HGB 13.2 12.9*  HCT 40.9 39.6  PLT 75* 103*  MCV 101.9* 104.2*  MCH 32.9 33.9  MCHC 32.3 32.6  RDW 16.9* 16.9*  LYMPHSABS 0.6* 1.9  MONOABS 1.3* 2.2*  EOSABS 0.0 0.0  BASOSABS 0.0 0.0   ------------------------------------------------------------------------------------------------------------------  Chemistries  Recent Labs  Lab 12/09/17 1124 12/14/17 1540  NA 139 133*  K 4.8 4.0  CL 102 100*  CO2 29 23  GLUCOSE 126 142*  BUN 19 27*  CREATININE 1.20 1.17  CALCIUM 9.1 8.0*  AST 29 87*  ALT 31 81*  ALKPHOS 121 118  BILITOT 0.5 0.5   ------------------------------------------------------------------------------------------------------------------ estimated creatinine clearance is 59.8 mL/min (by C-G formula based on SCr of 1.17 mg/dL). ------------------------------------------------------------------------------------------------------------------ No results for input(s): TSH, T4TOTAL, T3FREE, THYROIDAB in the last 72 hours.  Invalid input(s): FREET3  Coagulation profile No results for input(s): INR, PROTIME in the last 168 hours. ------------------------------------------------------------------------------------------------------------------- Recent Labs    12/14/17 1540  DDIMER 2.79*   -------------------------------------------------------------------------------------------------------------------  Cardiac Enzymes Recent Labs  Lab 12/14/17 1540  TROPONINI <0.03   ------------------------------------------------------------------------------------------------------------------ No results found for:  BNP   ---------------------------------------------------------------------------------------------------------------  Urinalysis    Component Value Date/Time   COLORURINE YELLOW 12/14/2017 [place] 12/14/2017 1721   LABSPEC 1.017 12/14/2017 1721   PHURINE 5.0 12/14/2017 1721   GLUCOSEU NEGATIVE 12/14/2017 1721   HGBUR NEGATIVE 12/14/2017 1721   BILIRUBINUR NEGATIVE 12/14/2017 1721   KETONESUR NEGATIVE 12/14/2017 1721   PROTEINUR 100 (  A) 12/14/2017 1721   UROBILINOGEN 0.2 05/06/2015 0935   NITRITE NEGATIVE 12/14/2017 1721   LEUKOCYTESUR NEGATIVE 12/14/2017 1721    ----------------------------------------------------------------------------------------------------------------   Imaging Results:    Dg Chest 2 View  Result Date: 12/14/2017 CLINICAL DATA:  Cough and shortness of breath. EXAM: CHEST  2 VIEW COMPARISON:  CT chest dated November 24, 2017. Chest x-ray dated August 21, 2017. FINDINGS: Unchanged right chest wall port catheter with the tip in the mid SVC. The heart size and mediastinal contours are within normal limits. Normal pulmonary vascularity. Left basilar atelectasis. No focal consolidation, pleural effusion, or pneumothorax. No acute osseous abnormality. IMPRESSION: Left basilar atelectasis.  No active cardiopulmonary disease. Electronically Signed   By: Titus Dubin M.D.   On: 12/14/2017 17:01   Ct Angio Chest Pe W/cm &/or Wo Cm  Result Date: 12/14/2017 CLINICAL DATA:  Weakness over the past few days. States he had chemo 3 weeks ago and since then he has lost 15 lbs. Has reportedly had multiple reactions to the chemo. Stage 4 ca EXAM: CT ANGIOGRAPHY CHEST WITH CONTRAST TECHNIQUE: Multidetector CT imaging of the chest was performed using the standard protocol during bolus administration of intravenous contrast. Multiplanar CT image reconstructions and MIPs were obtained to evaluate the vascular anatomy. CONTRAST:  2mL ISOVUE-370 IOPAMIDOL  (ISOVUE-370) INJECTION 76% COMPARISON:  CT chest, abdomen and pelvis, 11/24/2017. Current chest radiograph. FINDINGS: Cardiovascular: Contrast opacification of the pulmonary arteries is suboptimal, particularly to the mid to lower lung segmental and subsegmental vessels. This limits assessment for pulmonary emboli. Allowing for this, there is no evidence of a central pulmonary embolus. Heart is normal in size and configuration. No pericardial effusion. There are three-vessel coronary artery calcifications. The thoracic aorta is normal in caliber. No dissection. Mild atherosclerosis is noted along the aortic arch and descending thoracic aorta. Mediastinum/Nodes: Several prominent lymph nodes. There is a left paratracheal superior mediastinal node measuring 8 mm in short axis. There are anterior mediastinal lymph nodes, largest measuring 1.6 x 1.0 cm, lying adjacent to the left brachiocephalic vein. Allowing for differences in measurement technique, these are stable from the prior CT. There is increased soft tissue along the left hilar structures that is also stable. Trachea is widely patent. Esophagus is unremarkable. Lungs/Pleura: There is consolidation in the posteromedial left lower lobe. The dense consolidation is surrounded by hazy ground-glass opacity. There is bronchial wall thickening of the lower lobe segmental bronchi. There is a smaller area of consolidation in the posterolateral left upper lobe centered on image 81, series 10. There small nodular components to this. There is a spiculated nodule in the subpleural left lower lobe measuring 10 x 8 cm, which was present previously. There are no other discrete lung nodules. There are no other areas of consolidation. There are stable areas of peripheral interstitial thickening/scarring as well as upper lobe predominant emphysema. Mild subsegmental atelectasis is noted in the right lower lobe. No pleural effusion.  No pneumothorax. Upper Abdomen: No acute  findings. Musculoskeletal: No fracture or acute finding. No osteoblastic or osteolytic lesions. Review of the MIP images confirms the above findings. IMPRESSION: 1. Suboptimal exam limiting assessment for smaller pulmonary emboli. Allowing for this, there is no evidence of a pulmonary embolus. 2. Extensive left lower lobe consolidation consistent with pneumonia. 3. Findings of metastatic carcinoma noted on the prior chest, abdomen and pelvis CT are similar. Aortic Atherosclerosis (ICD10-I70.0) and Emphysema (ICD10-J43.9). Electronically Signed   By: Lajean Manes M.D.   On: 12/14/2017 19:56  Assessment & Plan:    Principal Problem:   Pneumonia Active Problems:   Leukocytosis   Anemia   Hyponatremia    Pneumonia, hcap Blood culture x2 Sputum gram stain /culture Urine strep, urine legionellla antigen vanco iv , cefepime iv pharmacy to dose.   Leukocytosis secondary to pneumonia Check cbc in am  Anemia Check cbc in am  Hyponatremia secondary to infection Hydrate with ns iv Check cmp in am  PE/DVT Cont xarelto  Hyperlipidemia Cont lipitor  Gerd Cont sucralfate  DVT Prophylaxis Xarelto  AM Labs Ordered, also please review Full Orders  Family Communication: Admission, patients condition and plan of care including tests being ordered have been discussed with the patient who indicate understanding and agree with the plan and Code Status.  Code Status FULL CODE  Likely DC to  home  Condition GUARDED    Consults called: none  Admission status: inpatient   Time spent in minutes : 45   Jani Gravel M.D on 12/14/2017 at 8:56 PM  Between 7am to 7pm - Pager - [phone]  . After 7pm go to [email or web address] - password [facility]  [facility] - Office  [phone]

## 2017-12-14 NOTE — ED Notes (Signed)
Patient transported to X-ray 

## 2017-12-14 NOTE — ED Provider Notes (Signed)
[facility] DEPT Provider Note   CSN: [id number] Arrival date & time: 12/14/17  1529     History   Chief Complaint Chief Complaint  Patient presents with  . Weakness  . Weight Loss    HPI Nicholas Hale is a 72 y.o. male.  HPI  72 year old male with a history of stage IV lung cancer currently being treated with chemotherapy and previously treated with radiation presents with weakness.  History is taken from wife and patient.  Around 3 weeks ago had his most recent chemo infusion.  Over the last 2-2-1/2 weeks has been having progressive weakness/overall fatigue.  No focal weakness.  Has also been having shortness of breath.  He states the shortness of breath is most noticeable during exertion but also present at rest.  He has a cough that has been present since radiation.  No vomiting.  He had constipation that was fixed with laxatives.  He denies any focal pain such as headache or abdominal pain.  He has been having some left-sided chest pain when he coughs but thinks that is from the cough itself.  He has been trying to increase fluid/p.o. intake with boost and other liquids.  He has been battling stomatitis as well.  Because he is losing weight and becoming weaker, wife called Dr. Lew Dawes office and nurse recommended to come to the ER.  Past Medical History:  Diagnosis Date  . Abdominal aortic aneurysm ([place])   . Arthritis    "hips; lower spine" (05/24/2014)  . Chemotherapy induced neutropenia ([place]) 03/31/2017  . Chronic fatigue 10/05/2016  . Encounter for antineoplastic chemotherapy 02/17/2017  . History of radiation therapy 10/05/17-10/25/17   left lung/chest 35 Gy in 14 fractions  . Hyperlipidemia   . Hypertension   . Lung cancer ([place]) 09/25/2016  . PE (pulmonary embolism) 9/15  . Pericardial effusion 09/20/2016   Malignant effusion s/p pericardial drain  . Personal history of DVT (deep vein thrombosis) 9/15    Patient Active Problem List   Diagnosis Date Noted  . Pneumonia 12/14/2017  . Leukocytosis 12/14/2017  . Anemia 12/14/2017  . Hyponatremia 12/14/2017  . HCAP (healthcare-associated pneumonia)   . Influenza vaccination administered at current visit 09/02/2017  . Gingival bleeding 08/21/2017  . Thrombocytopenia ([place]) 08/21/2017  . Dyspnea on exertion 04/05/2017  . Chemotherapy induced neutropenia ([place]) 03/31/2017  . Port catheter in place 03/03/2017  . Encounter for antineoplastic chemotherapy 02/17/2017  . Adenocarcinoma of left lung, stage 4 ([place]) 10/05/2016  . Goals of care, counseling/discussion 10/05/2016  . Chronic fatigue 10/05/2016  . Lung cancer ([place]) 09/25/2016  . Acute deep vein thrombosis (DVT) of proximal vein of both lower extremities ([facility])   . Cardiac tamponade   . Elevated troponin 09/20/2016  . 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 Ventricular tachycardia ([place]) 09/19/2016  . Renal cyst, right 09/19/2016  . Liver lesion 09/19/2016  . Nonsustained ventricular tachycardia ([place])   . History of tobacco abuse 01/28/2016  . Primary osteoarthritis of right hip 05/10/2015  . 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 Arthritis of left hip 05/23/2014  . Hypertension 05/15/2014  . Hyperlipidemia 05/15/2014    Past Surgical History:  Procedure Laterality Date  . ANTERIOR FUSION CERVICAL SPINE  ~ 2005  . APPENDECTOMY  1978  . CARDIAC CATHETERIZATION N/A 09/21/2016   Procedure: Pericardiocentesis;  Surgeon: Jettie Booze, MD;  Location: [facility];  Service: Cardiovascular;  Laterality: N/A;  . COLONOSCOPY    . HEMIARTHROPLASTY  SHOULDER FRACTURE Left 12/2008  . INGUINAL HERNIA REPAIR Bilateral 1992  . IR GENERIC HISTORICAL  02/01/2017   IR FLUORO GUIDE PORT INSERTION RIGHT 02/01/2017 Arne Cleveland, MD [facility]  . IR GENERIC HISTORICAL  02/01/2017   IR US GUIDE VASC ACCESS RIGHT 02/01/2017 Arne Cleveland, MD [facility]  . JOINT REPLACEMENT    . PERICARDIAL FLUID  DRAINAGE  09/21/2016  . TOTAL HIP ARTHROPLASTY Left 05/23/2014   Procedure: TOTAL HIP ARTHROPLASTY;  Surgeon: Kerin Salen, MD;  Location: [facility];  Service: Orthopedics;  Laterality: Left;  . TOTAL HIP ARTHROPLASTY Right 05/15/2015   Procedure: TOTAL HIP ARTHROPLASTY;  Surgeon: Frederik Pear, MD;  Location: [facility];  Service: Orthopedics;  Laterality: Right;       Home Medications    Prior to Admission medications   Medication Sig Start Date End Date Taking? Authorizing Provider  albuterol (PROVENTIL HFA;VENTOLIN HFA) 108 (90 Base) MCG/ACT inhaler Inhale 2 puffs into the lungs every 4 (four) hours as needed for wheezing or shortness of breath. 07/27/17  Yes Byrum, Rose Fillers, MD  atorvastatin (LIPITOR) 20 MG tablet TAKE 1 TABLET BY MOUTH EVERY DAY AT 6PM 10/12/17  Yes End, Harrell Gave, MD  Dextromethorphan-Guaifenesin (MUCINEX DM PO) Take 20 mLs by mouth 2 (two) times daily as needed (CHEST CONGESTION).   Yes [provider]  diphenhydrAMINE (BENADRYL) 25 MG tablet Take 509 mg by mouth at bedtime as needed for sleep.   Yes [provider]  lidocaine (XYLOCAINE) 2 % solution Use as directed 5 mLs in the mouth or throat every 3 (three) hours as needed for mouth pain. 12/02/17  Yes Tanner, Lyndon Code., PA-C  lidocaine-prilocaine (EMLA) cream Apply 1 application topically as needed (on port site).    Yes [provider]  magic mouthwash SOLN Take 5 mLs by mouth 4 (four) times daily. Prednisone 80 mg , Nystatin 30 ml, Benadryl 12.5 mg/5 ml. Mix to 240 ml, 1:1:1 concentration. 11/24/17  Yes [name], MD  oxyCODONE (ROXICODONE) 5 MG/5ML solution Take 5 mLs (5 mg total) by mouth every 4 (four) hours as needed for severe pain. 12/02/17  Yes Tanner, Lyndon Code., PA-C  sodium chloride (OCEAN) 0.65 % SOLN nasal spray Place 2 sprays into both nostrils as needed for congestion.   Yes [provider]  sucralfate (CARAFATE) 1 g tablet Take 1 tablet (1 g total) by mouth 4 (four) times  daily -  with meals and at bedtime. Crush and dissolve in 10 cc of water prior to swallowing 10/25/17  Yes Gery Pray, MD  XARELTO 20 MG TABS tablet TAKE 1 TABLET BY MOUTH EVERY DAY 11/17/17  Yes Byrum, Rose Fillers, MD  chlorpheniramine-HYDROcodone (TUSSIONEX) 10-8 MG/5ML SUER Take 5 mLs at bedtime as needed by mouth for cough. Patient not taking: Reported on 12/14/2017 10/12/17   Gery Pray, MD  dexamethasone (DECADRON) 4 MG tablet 4 mg by mouth twice a day the day before, day of and day after the chemotherapy every 3 weeks. 02/17/17   [name], MD  oxyCODONE-acetaminophen (PERCOCET/ROXICET) 5-325 MG tablet Take 1 tablet by mouth every 4 (four) hours as needed for severe pain. Patient not taking: Reported on 12/02/2017 10/25/17   Gery Pray, MD  prochlorperazine (COMPAZINE) 10 MG tablet Take 1 tablet (10 mg total) by mouth every 6 (six) hours as needed for nausea or vomiting. Patient not taking: Reported on 12/02/2017 11/24/17   [name], MD    Family History Family History  Problem Relation Age of  Onset  . Clotting disorder Mother   . Diabetes Mellitus I Mother   . Prostate cancer Father     Social History Social History   Tobacco Use  . Smoking status: Former Smoker    Packs/day: 0.50    Years: 40.00    Pack years: 20.00    Types: Cigarettes    Last attempt to quit: 2010    Years since quitting: 9.0  . Smokeless tobacco: Never Used  Substance Use Topics  . Alcohol use: No  . Drug use: No     Allergies   Lidocaine   Review of Systems Review of Systems  Constitutional: Positive for fatigue. Negative for fever.  Respiratory: Positive for cough and shortness of breath.   Cardiovascular: Positive for chest pain (with coughing). Negative for leg swelling.  Gastrointestinal: Positive for constipation. Negative for abdominal pain and vomiting.  Genitourinary: Negative for dysuria.  Neurological: Positive for weakness. Negative for headaches.  All other  systems reviewed and are negative.    Physical Exam Updated Vital Signs BP 119/80   Pulse 89   Temp (!) 97.4 F (36.3 C) (Oral)   Resp 16   SpO2 95%   Physical Exam  Constitutional: He is oriented to person, place, and time. He appears well-developed. No distress.  HENT:  Head: Normocephalic and atraumatic.  Right Ear: External ear normal.  Left Ear: External ear normal.  Nose: Nose normal.  Mouth/Throat: Mucous membranes are dry.  Eyes: Right eye exhibits no discharge. Left eye exhibits no discharge.  Neck: Neck supple.  Cardiovascular: Regular rhythm and normal heart sounds. Tachycardia present.  HR~100  Pulmonary/Chest: Effort normal and breath sounds normal. No accessory muscle usage. Tachypnea (mild) noted. No respiratory distress.  Abdominal: Soft. He exhibits no distension. There is no tenderness.  Musculoskeletal: He exhibits no edema.  CN 3-12 grossly intact. 5/5 strength in all 4 extremities. Grossly normal sensation. Normal finger to nose.   Neurological: He is alert and oriented to person, place, and time.  Skin: Skin is warm and dry. He is not diaphoretic.  Nursing note and vitals reviewed.    ED Treatments / Results  Labs (all labs ordered are listed, but only abnormal results are displayed) Labs Reviewed  BASIC METABOLIC PANEL - Abnormal; Notable for the following components:      Result Value   Sodium 133 (*)    Chloride 100 (*)    Glucose, Bld 142 (*)    BUN 27 (*)    Calcium 8.0 (*)    All other components within normal limits  CBC - Abnormal; Notable for the following components:   WBC 24.1 (*)    RBC 3.80 (*)    Hemoglobin 12.9 (*)    MCV 104.2 (*)    RDW 16.9 (*)    Platelets 103 (*)    All other components within normal limits  URINALYSIS, ROUTINE W REFLEX MICROSCOPIC - Abnormal; Notable for the following components:   Protein, ur 100 (*)    All other components within normal limits  HEPATIC FUNCTION PANEL - Abnormal; Notable for the  following components:   Albumin 2.5 (*)    AST 87 (*)    ALT 81 (*)    All other components within normal limits  D-DIMER, QUANTITATIVE (NOT AT [facility]) - Abnormal; Notable for the following components:   D-Dimer, Quant 2.79 (*)    All other components within normal limits  DIFFERENTIAL - Abnormal; Notable for the following components:   Neutro  Abs 20.0 (*)    Monocytes Absolute 2.2 (*)    All other components within normal limits  CBG MONITORING, ED - Abnormal; Notable for the following components:   Glucose-Capillary 141 (*)    All other components within normal limits  CULTURE, BLOOD (ROUTINE X 2)  CULTURE, BLOOD (ROUTINE X 2)  TROPONIN I  INFLUENZA PANEL BY PCR (TYPE A & B)  CREATININE, SERUM  I-STAT CG4 LACTIC ACID, ED  I-STAT CG4 LACTIC ACID, ED    EKG  EKG Interpretation  Date/Time:  Tuesday December 14 2017 15:48:07 EST Ventricular Rate:  111 PR Interval:    QRS Duration: 88 QT Interval:  301 QTC Calculation: 409 R Axis:   58 Text Interpretation:  Sinus tachycardia Low voltage, extremity leads Nonspecific T abnormalities, lateral leads Baseline wander in lead(s) V4 tachycardia new, otherwise similar to May 2018 Confirmed by Sherwood Gambler [phone]) on 12/14/2017 4:07:58 PM       Radiology Dg Chest 2 View  Result Date: 12/14/2017 CLINICAL DATA:  Cough and shortness of breath. EXAM: CHEST  2 VIEW COMPARISON:  CT chest dated November 24, 2017. Chest x-ray dated August 21, 2017. FINDINGS: Unchanged right chest wall port catheter with the tip in the mid SVC. The heart size and mediastinal contours are within normal limits. Normal pulmonary vascularity. Left basilar atelectasis. No focal consolidation, pleural effusion, or pneumothorax. No acute osseous abnormality. IMPRESSION: Left basilar atelectasis.  No active cardiopulmonary disease. Electronically Signed   By: Titus Dubin M.D.   On: 12/14/2017 17:01   Ct Angio Chest Pe W/cm &/or Wo Cm  Result Date:  12/14/2017 CLINICAL DATA:  Weakness over the past few days. States he had chemo 3 weeks ago and since then he has lost 15 lbs. Has reportedly had multiple reactions to the chemo. Stage 4 ca EXAM: CT ANGIOGRAPHY CHEST WITH CONTRAST TECHNIQUE: Multidetector CT imaging of the chest was performed using the standard protocol during bolus administration of intravenous contrast. Multiplanar CT image reconstructions and MIPs were obtained to evaluate the vascular anatomy. CONTRAST:  61mL ISOVUE-370 IOPAMIDOL (ISOVUE-370) INJECTION 76% COMPARISON:  CT chest, abdomen and pelvis, 11/24/2017. Current chest radiograph. FINDINGS: Cardiovascular: Contrast opacification of the pulmonary arteries is suboptimal, particularly to the mid to lower lung segmental and subsegmental vessels. This limits assessment for pulmonary emboli. Allowing for this, there is no evidence of a central pulmonary embolus. Heart is normal in size and configuration. No pericardial effusion. There are three-vessel coronary artery calcifications. The thoracic aorta is normal in caliber. No dissection. Mild atherosclerosis is noted along the aortic arch and descending thoracic aorta. Mediastinum/Nodes: Several prominent lymph nodes. There is a left paratracheal superior mediastinal node measuring 8 mm in short axis. There are anterior mediastinal lymph nodes, largest measuring 1.6 x 1.0 cm, lying adjacent to the left brachiocephalic vein. Allowing for differences in measurement technique, these are stable from the prior CT. There is increased soft tissue along the left hilar structures that is also stable. Trachea is widely patent. Esophagus is unremarkable. Lungs/Pleura: There is consolidation in the posteromedial left lower lobe. The dense consolidation is surrounded by hazy ground-glass opacity. There is bronchial wall thickening of the lower lobe segmental bronchi. There is a smaller area of consolidation in the posterolateral left upper lobe centered on  image 81, series 10. There small nodular components to this. There is a spiculated nodule in the subpleural left lower lobe measuring 10 x 8 cm, which was present previously. There are no other discrete  lung nodules. There are no other areas of consolidation. There are stable areas of peripheral interstitial thickening/scarring as well as upper lobe predominant emphysema. Mild subsegmental atelectasis is noted in the right lower lobe. No pleural effusion.  No pneumothorax. Upper Abdomen: No acute findings. Musculoskeletal: No fracture or acute finding. No osteoblastic or osteolytic lesions. Review of the MIP images confirms the above findings. IMPRESSION: 1. Suboptimal exam limiting assessment for smaller pulmonary emboli. Allowing for this, there is no evidence of a pulmonary embolus. 2. Extensive left lower lobe consolidation consistent with pneumonia. 3. Findings of metastatic carcinoma noted on the prior chest, abdomen and pelvis CT are similar. Aortic Atherosclerosis (ICD10-I70.0) and Emphysema (ICD10-J43.9). Electronically Signed   By: Lajean Manes M.D.   On: 12/14/2017 19:56    Procedures Procedures (including critical care time)  Medications Ordered in ED Medications  vancomycin (VANCOCIN) 1,500 mg in sodium chloride 0.9 % 500 mL IVPB (1,500 mg Intravenous New Bag/Given 12/14/17 2141)  vancomycin (VANCOCIN) 1,500 mg in sodium chloride 0.9 % 500 mL IVPB (not administered)  sodium chloride 0.9 % bolus 1,000 mL (0 mLs Intravenous Stopped 12/14/17 1935)  iopamidol (ISOVUE-370) 76 % injection (100 mLs  Contrast Given 12/14/17 1922)  sodium chloride 0.9 % bolus 500 mL (500 mLs Intravenous New Bag/Given 12/14/17 1941)  ceFEPIme (MAXIPIME) 2 g in dextrose 5 % 50 mL IVPB (2 g Intravenous New Bag/Given 12/14/17 2140)     Initial Impression / Assessment and Plan / ED Course  I have reviewed the triage vital signs and the nursing notes.  Pertinent labs & imaging results that were available during my care  of the patient were reviewed by me and considered in my medical decision making (see chart for details).     Patient CT scan shows left lower pneumonia.  Given his significant WBC elevation of 24 combined with increased work of breathing and immunosuppression, he will need admission with broad-spectrum antibiotics.  His vital signs have otherwise been stable besides mild hypothermia.  Admit to the hospitalist service.  Final Clinical Impressions(s) / ED Diagnoses   Final diagnoses:  HCAP (healthcare-associated pneumonia)    ED Discharge Orders    None       Sherwood Gambler, MD 12/14/17 2232

## 2017-12-15 ENCOUNTER — Other Ambulatory Visit: Payer: Self-pay

## 2017-12-15 DIAGNOSIS — D72828 Other elevated white blood cell count: Secondary | ICD-10-CM

## 2017-12-15 DIAGNOSIS — R7881 Bacteremia: Secondary | ICD-10-CM

## 2017-12-15 DIAGNOSIS — E785 Hyperlipidemia, unspecified: Secondary | ICD-10-CM

## 2017-12-15 DIAGNOSIS — T80219S Unspecified infection due to central venous catheter, sequela: Secondary | ICD-10-CM

## 2017-12-15 DIAGNOSIS — D72829 Elevated white blood cell count, unspecified: Secondary | ICD-10-CM

## 2017-12-15 DIAGNOSIS — Z87891 Personal history of nicotine dependence: Secondary | ICD-10-CM

## 2017-12-15 DIAGNOSIS — J189 Pneumonia, unspecified organism: Secondary | ICD-10-CM

## 2017-12-15 DIAGNOSIS — I10 Essential (primary) hypertension: Secondary | ICD-10-CM

## 2017-12-15 DIAGNOSIS — E440 Moderate protein-calorie malnutrition: Secondary | ICD-10-CM

## 2017-12-15 LAB — BLOOD CULTURE ID PANEL (REFLEXED)
ACINETOBACTER BAUMANNII: NOT DETECTED
CANDIDA ALBICANS: NOT DETECTED
CANDIDA GLABRATA: NOT DETECTED
Candida krusei: NOT DETECTED
Candida parapsilosis: NOT DETECTED
Candida tropicalis: NOT DETECTED
ENTEROBACTERIACEAE SPECIES: NOT DETECTED
ENTEROCOCCUS SPECIES: NOT DETECTED
Enterobacter cloacae complex: NOT DETECTED
Escherichia coli: NOT DETECTED
HAEMOPHILUS INFLUENZAE: NOT DETECTED
Klebsiella oxytoca: NOT DETECTED
Klebsiella pneumoniae: NOT DETECTED
LISTERIA MONOCYTOGENES: NOT DETECTED
METHICILLIN RESISTANCE: NOT DETECTED
NEISSERIA MENINGITIDIS: NOT DETECTED
PSEUDOMONAS AERUGINOSA: NOT DETECTED
Proteus species: NOT DETECTED
STREPTOCOCCUS AGALACTIAE: NOT DETECTED
STREPTOCOCCUS SPECIES: NOT DETECTED
Serratia marcescens: NOT DETECTED
Staphylococcus aureus (BCID): DETECTED — AB
Staphylococcus species: DETECTED — AB
Streptococcus pneumoniae: NOT DETECTED
Streptococcus pyogenes: NOT DETECTED

## 2017-12-15 LAB — CREATININE, SERUM
Creatinine, Ser: 1.04 mg/dL (ref 0.61–1.24)
GFR calc non Af Amer: >60 mL/min (ref >60)

## 2017-12-15 LAB — EXPECTORATED SPUTUM ASSESSMENT W GRAM STAIN, RFLX TO RESP C

## 2017-12-15 MED ORDER — SODIUM CHLORIDE 0.9 % IV SOLN
INTRAVENOUS | Status: DC
  Administered 2017-12-15: 08:00:00 via INTRAVENOUS

## 2017-12-15 MED ORDER — CEFAZOLIN SODIUM-DEXTROSE 2-4 GM/100ML-% IV SOLN
2.0000 g | Freq: Three times a day (TID) | INTRAVENOUS | Status: DC
  Administered 2017-12-15 – 2017-12-24 (×28): 2 g via INTRAVENOUS
  Filled 2017-12-15 (×31): qty 100

## 2017-12-15 MED ORDER — GUAIFENESIN-DM 100-10 MG/5ML PO SYRP: 5.0000 mL | ORAL_SOLUTION | ORAL | Status: DC | PRN

## 2017-12-15 MED ORDER — SODIUM CHLORIDE 0.9 % IV SOLN
INTRAVENOUS | Status: AC
  Administered 2017-12-15 – 2017-12-16 (×2): via INTRAVENOUS

## 2017-12-15 MED ORDER — ALBUTEROL SULFATE HFA 108 (90 BASE) MCG/ACT IN AERS
2.0000 | INHALATION_SPRAY | RESPIRATORY_TRACT | Status: DC | PRN
  Filled 2017-12-15: qty 6.7

## 2017-12-15 MED ORDER — IPRATROPIUM-ALBUTEROL 0.5-2.5 (3) MG/3ML IN SOLN: 3.0000 mL | RESPIRATORY_TRACT | Status: DC | PRN

## 2017-12-15 MED ORDER — SALINE SPRAY 0.65 % NA SOLN: 2.0000 | NASAL | Status: DC | PRN

## 2017-12-15 MED ORDER — OXYCODONE HCL 5 MG/5ML PO SOLN
5.0000 mg | ORAL | Status: DC | PRN
  Administered 2017-12-15 – 2017-12-21 (×13): 5 mg via ORAL
  Filled 2017-12-15 (×13): qty 5

## 2017-12-15 MED ORDER — CEFEPIME HCL 1 G IJ SOLR
1.0000 g | Freq: Three times a day (TID) | INTRAMUSCULAR | Status: DC
  Administered 2017-12-15: 1 g via INTRAVENOUS
  Filled 2017-12-15 (×2): qty 1

## 2017-12-15 MED ORDER — LIDOCAINE VISCOUS 2 % MT SOLN
5.0000 mL | OROMUCOSAL | Status: DC | PRN
  Filled 2017-12-15: qty 15

## 2017-12-15 MED ORDER — ALBUTEROL SULFATE (2.5 MG/3ML) 0.083% IN NEBU
2.5000 mg | INHALATION_SOLUTION | RESPIRATORY_TRACT | Status: DC | PRN
Start: 2017-12-15 — End: 2017-12-16

## 2017-12-15 MED ORDER — SUCRALFATE 1 G PO TABS
1.0000 g | ORAL_TABLET | Freq: Three times a day (TID) | ORAL | Status: DC
  Administered 2017-12-15 – 2017-12-24 (×18): 1 g via ORAL
  Filled 2017-12-15 (×22): qty 1

## 2017-12-15 MED ORDER — LIDOCAINE-PRILOCAINE 2.5-2.5 % EX CREA
1.0000 "application " | TOPICAL_CREAM | CUTANEOUS | Status: DC | PRN
  Filled 2017-12-15: qty 5

## 2017-12-15 MED ORDER — ATORVASTATIN CALCIUM 20 MG PO TABS: 20.0000 mg | ORAL_TABLET | Freq: Every day | ORAL | Status: DC

## 2017-12-15 MED ORDER — RIVAROXABAN 20 MG PO TABS
20.0000 mg | ORAL_TABLET | Freq: Every day | ORAL | Status: DC
  Administered 2017-12-15: 20 mg via ORAL
  Filled 2017-12-15 (×2): qty 1

## 2017-12-15 NOTE — Consult Note (Signed)
[facility]  Total days of antibiotics 2        Day 2 vanco/cefepime               Reason for Consult:SAB    Referring Physician: myers  Principal Problem:   Pneumonia Active Problems:   Leukocytosis   Anemia   Hyponatremia    HPI: Nicholas Hale is a 72 y.o. male with history of stage IV (T1a, N3, M1b) non-small cell lung cancer, adenocarcinoma of the left lower lobe with bilateral mediastinal and supraclavicular lymphadenopathy as well as peritoneal lymphadenopathy and a malignant pericardial effusion, originally dx in Fall of 2017,  s/p 1st cycle of Taxotere and Cyramza  on 11/24/2017 plus GCSF on 11/26/2017. His wife reports that the first 4 days doing well but subsequently became weaker, suffered significant mucositis, and inflammation of tonsilar tissue, and started to have chills. He had significant decreased appetite or po intake due to the discomfort despite magic mouthwash. He was seen by Dr Arvilla Market team on 1/10 for follow up and treated his mucositis and constipation and weight loss. The next few days, he was found to having increasing weakness, cough and confusion thus was admitted on 1/15. He was found to have leukocytosis of 24K, with 83%N, toxic granulation on smear. His CT was significant for left lower lobe infiltrate per my review in addn to previously known LN and pulmonary nodules associate with stage 4 cancer. He was started on vancomycin plus cefepime. Infectious work up, revealed blood cx + MSSA. His abtx have been narrowed to cefazolin from blood cx. He is still feeling quite weak. Sleeping in bed. Most of the details coming from his wife at his bedside.  She previously worked with [employer], the NGO affiliated with our [place] [facility].   Past Medical History:  Diagnosis Date  . Abdominal aortic aneurysm ([place])   . Arthritis    "hips; lower spine" (05/24/2014)  . Chemotherapy induced neutropenia ([place]) 03/31/2017  . Chronic fatigue 10/05/2016  .  Encounter for antineoplastic chemotherapy 02/17/2017  . History of radiation therapy 10/05/17-10/25/17   left lung/chest 35 Gy in 14 fractions  . Hyperlipidemia   . Hypertension   . Lung cancer ([place]) 09/25/2016  . PE (pulmonary embolism) 9/15  . Pericardial effusion 09/20/2016   Malignant effusion s/p pericardial drain  . Personal history of DVT (deep vein thrombosis) 9/15    Allergies:  Allergies  Allergen Reactions  . Lidocaine Swelling    "Hard white blisters" formed on tongue, swollen mouth     MEDICATIONS: . rivaroxaban  20 mg Oral Daily  . sucralfate  1 g Oral TID WC & HS    Social History   Tobacco Use  . Smoking status: Former Smoker    Packs/day: 0.50    Years: 40.00    Pack years: 20.00    Types: Cigarettes    Last attempt to quit: 2010    Years since quitting: 9.0  . Smokeless tobacco: Never Used  Substance Use Topics  . Alcohol use: No  . Drug use: No    Family History  Problem Relation Age of Onset  . Clotting disorder Mother   . Diabetes Mellitus I Mother   . Prostate cancer Father      Review of Systems  Constitutional: positive for fever, chills, diaphoresis, activity change, appetite change, fatigue and unexpected weight change.  HENT: Negative for congestion, sore throat, rhinorrhea, sneezing, trouble swallowing and sinus pressure. odonyphagia Eyes:  Negative for photophobia and visual disturbance.  Respiratory: positive for cough, chest tightness, shortness of breath, wheezing and stridor.  Cardiovascular: Negative for chest pain, palpitations and leg swelling.  Gastrointestinal: Negative for nausea, vomiting, abdominal pain, diarrhea, constipation, blood in stool, abdominal distention and anal bleeding.  Genitourinary: Negative for dysuria, hematuria, flank pain and difficulty urinating.  Musculoskeletal: Negative for myalgias, back pain, joint swelling, arthralgias and gait problem.  Skin: Negative for color change, pallor, rash and wound.    Neurological: Negative for dizziness, tremors, weakness and light-headedness.  Hematological: Negative for adenopathy. Does not bruise/bleed easily.  Psychiatric/Behavioral: Negative for behavioral problems, confusion, sleep disturbance, dysphoric mood, decreased concentration and agitation.     OBJECTIVE: Temp:  [97.4 F (36.3 C)] 97.4 F (36.3 C) (01/15 1536) Pulse Rate:  [80-113] 80 (01/16 1444) Resp:  [14-31] 21 (01/16 1444) BP: (119-152)/(66-99) 135/77 (01/16 1444) SpO2:  [89 %-99 %] 93 % (01/16 1444) Physical Exam  Constitutional: He is oriented to person, place, and time. He appears well-developed and well-nourished. No distress.  HENT:  Poor dentition with peridontal disease some mild signs of mucositis in soft palate. No thrush Mouth/Throat: Oropharynx is clear and moist. No oropharyngeal exudate.  Chest wall = portacath on right side is c/d/i no surrouding erythema streaking or fluctuation Cardiovascular: Normal rate, regular rhythm and normal heart sounds. Exam reveals no gallop and no friction rub.  No murmur heard.  Pulmonary/Chest: Effort normal and breath sounds normal. No respiratory distress. He has no wheezes.  Abdominal: Soft. Bowel sounds are normal. He exhibits no distension. There is no tenderness.  Lymphadenopathy:  He has no cervical adenopathy.  Neurological: He is alert and oriented to person, place, and time.  Skin: Skin is warm and dry. No rash noted. No erythema.  Psychiatric: He has a normal mood and affect. His behavior is normal.     LABS: Results for orders placed or performed during the hospital encounter of 12/14/17 (from the past 48 hour(s))  Basic metabolic panel     Status: Abnormal   Collection Time: 12/14/17  3:40 PM  Result Value Ref Range   Sodium 133 (L) 135 - 145 mmol/L   Potassium 4.0 3.5 - 5.1 mmol/L   Chloride 100 (L) 101 - 111 mmol/L   CO2 23 22 - 32 mmol/L   Glucose, Bld 142 (H) 65 - 99 mg/dL   BUN 27 (H) 6 - 20 mg/dL    Creatinine, Ser 1.17 0.61 - 1.24 mg/dL   Calcium 8.0 (L) 8.9 - 10.3 mg/dL   GFR calc non Af Amer >60 >60 mL/min   GFR calc Af Amer >60 >60 mL/min    Comment: (NOTE) The eGFR has been calculated using the CKD EPI equation. This calculation has not been validated in all clinical situations. eGFR's persistently <60 mL/min signify possible Chronic Kidney Disease.    Anion gap 10 5 - 15  CBC     Status: Abnormal   Collection Time: 12/14/17  3:40 PM  Result Value Ref Range   WBC 24.1 (H) 4.0 - 10.5 K/uL   RBC 3.80 (L) 4.22 - 5.81 MIL/uL   Hemoglobin 12.9 (L) 13.0 - 17.0 g/dL   HCT 39.6 39.0 - 52.0 %   MCV 104.2 (H) 78.0 - 100.0 fL   MCH 33.9 26.0 - 34.0 pg   MCHC 32.6 30.0 - 36.0 g/dL   RDW 16.9 (H) 11.5 - 15.5 %   Platelets 103 (L) 150 - 400 K/uL    Comment:  REPEATED TO VERIFY SPECIMEN CHECKED FOR CLOTS PLATELET COUNT CONFIRMED BY SMEAR   Troponin I     Status: None   Collection Time: 12/14/17  3:40 PM  Result Value Ref Range   Troponin I <0.03 <0.03 ng/mL  Hepatic function panel     Status: Abnormal   Collection Time: 12/14/17  3:40 PM  Result Value Ref Range   Total Protein 6.9 6.5 - 8.1 g/dL   Albumin 2.5 (L) 3.5 - 5.0 g/dL   AST 87 (H) 15 - 41 U/L   ALT 81 (H) 17 - 63 U/L   Alkaline Phosphatase 118 38 - 126 U/L   Total Bilirubin 0.5 0.3 - 1.2 mg/dL   Bilirubin, Direct 0.1 0.1 - 0.5 mg/dL   Indirect Bilirubin 0.4 0.3 - 0.9 mg/dL  D-dimer, quantitative     Status: Abnormal   Collection Time: 12/14/17  3:40 PM  Result Value Ref Range   D-Dimer, Quant 2.79 (H) 0.00 - 0.50 ug/mL-FEU    Comment: (NOTE) At the manufacturer cut-off of 0.50 ug/mL FEU, this assay has been documented to exclude PE with a sensitivity and negative predictive value of 97 to 99%.  At this time, this assay has not been approved by the FDA to exclude DVT/VTE. Results should be correlated with clinical presentation.   Differential     Status: Abnormal   Collection Time: 12/14/17  3:40 PM  Result  Value Ref Range   Neutrophils Relative % 83 %   Lymphocytes Relative 8 %   Monocytes Relative 9 %   Eosinophils Relative 0 %   Basophils Relative 0 %   Neutro Abs 20.0 (H) 1.7 - 7.7 K/uL   Lymphs Abs 1.9 0.7 - 4.0 K/uL   Monocytes Absolute 2.2 (H) 0.1 - 1.0 K/uL   Eosinophils Absolute 0.0 0.0 - 0.7 K/uL   Basophils Absolute 0.0 0.0 - 0.1 K/uL   WBC Morphology TOXIC GRANULATION   CBG monitoring, ED     Status: Abnormal   Collection Time: 12/14/17  3:50 PM  Result Value Ref Range   Glucose-Capillary 141 (H) 65 - 99 mg/dL  Culture, blood (routine x 2)     Status: None (Preliminary result)   Collection Time: 12/14/17  4:45 PM  Result Value Ref Range   Specimen Description BLOOD PORTA CATH [facility]    Special Requests      BOTTLES DRAWN AEROBIC AND ANAEROBIC Blood Culture adequate volume   Culture  Setup Time      GRAM POSITIVE COCCI AEROBIC BOTTLE ONLY Organism ID to follow CRITICAL RESULT CALLED TO, READ BACK BY AND VERIFIED WITH: E JACKSON 12/15/17 137P JW Performed at [facility], [address], [place], [place] [address]    Culture GRAM POSITIVE COCCI    Report Status PENDING   Blood Culture ID Panel (Reflexed)     Status: Abnormal   Collection Time: 12/14/17  4:45 PM  Result Value Ref Range   Enterococcus species NOT DETECTED NOT DETECTED   Listeria monocytogenes NOT DETECTED NOT DETECTED   Staphylococcus species DETECTED (A) NOT DETECTED    Comment: CRITICAL RESULT CALLED TO, READ BACK BY AND VERIFIED WITH: E JACKSON 12/15/17 137P JW    Staphylococcus aureus DETECTED (A) NOT DETECTED    Comment: Methicillin (oxacillin) susceptible Staphylococcus aureus (MSSA). Preferred therapy is anti staphylococcal beta lactam antibiotic (Cefazolin or Nafcillin), unless clinically contraindicated. CRITICAL RESULT CALLED TO, READ BACK BY AND VERIFIED WITH: E JACKSON 12/15/17 137P JW  Methicillin resistance NOT DETECTED NOT DETECTED   Streptococcus species NOT DETECTED NOT DETECTED     Streptococcus agalactiae NOT DETECTED NOT DETECTED   Streptococcus pneumoniae NOT DETECTED NOT DETECTED   Streptococcus pyogenes NOT DETECTED NOT DETECTED   Acinetobacter baumannii NOT DETECTED NOT DETECTED   Enterobacteriaceae species NOT DETECTED NOT DETECTED   Enterobacter cloacae complex NOT DETECTED NOT DETECTED   Escherichia coli NOT DETECTED NOT DETECTED   Klebsiella oxytoca NOT DETECTED NOT DETECTED   Klebsiella pneumoniae NOT DETECTED NOT DETECTED   Proteus species NOT DETECTED NOT DETECTED   Serratia marcescens NOT DETECTED NOT DETECTED   Haemophilus influenzae NOT DETECTED NOT DETECTED   Neisseria meningitidis NOT DETECTED NOT DETECTED   Pseudomonas aeruginosa NOT DETECTED NOT DETECTED   Candida albicans NOT DETECTED NOT DETECTED   Candida glabrata NOT DETECTED NOT DETECTED   Candida krusei NOT DETECTED NOT DETECTED   Candida parapsilosis NOT DETECTED NOT DETECTED   Candida tropicalis NOT DETECTED NOT DETECTED    Comment: Performed at [facility] [address] [place], [place] [address]  Urinalysis, Routine w reflex microscopic     Status: Abnormal   Collection Time: 12/14/17  5:21 PM  Result Value Ref Range   Color, Urine YELLOW YELLOW   APPearance CLEAR CLEAR   Specific Gravity, Urine 1.017 1.005 - 1.030   pH 5.0 5.0 - 8.0   Glucose, UA NEGATIVE NEGATIVE mg/dL   Hgb urine dipstick NEGATIVE NEGATIVE   Bilirubin Urine NEGATIVE NEGATIVE   Ketones, ur NEGATIVE NEGATIVE mg/dL   Protein, ur 100 (A) NEGATIVE mg/dL   Nitrite NEGATIVE NEGATIVE   Leukocytes, UA NEGATIVE NEGATIVE   RBC / HPF 0-5 0 - 5 RBC/hpf   WBC, UA 0-5 0 - 5 WBC/hpf   Bacteria, UA NONE SEEN NONE SEEN   Squamous Epithelial / LPF NONE SEEN NONE SEEN   Mucus PRESENT    Hyaline Casts, UA PRESENT   Influenza panel by PCR (type A & B)     Status: None   Collection Time: 12/14/17  8:45 PM  Result Value Ref Range   Influenza A By PCR NEGATIVE NEGATIVE   Influenza B By PCR NEGATIVE NEGATIVE     Comment: (NOTE) The Xpert Xpress Flu assay is intended as an aid in the diagnosis of  influenza and should not be used as a sole basis for treatment.  This  assay is FDA approved for nasopharyngeal swab specimens only. Nasal  washings and aspirates are unacceptable for Xpert Xpress Flu testing.   I-Stat CG4 Lactic Acid, ED     Status: None   Collection Time: 12/14/17 11:17 PM  Result Value Ref Range   Lactic Acid, Venous 1.89 0.5 - 1.9 mmol/L  Creatinine, serum     Status: None   Collection Time: 12/15/17  7:21 AM  Result Value Ref Range   Creatinine, Ser 1.04 0.61 - 1.24 mg/dL   GFR calc non Af Amer >60 >60 mL/min   GFR calc Af Amer >60 >60 mL/min    Comment: (NOTE) The eGFR has been calculated using the CKD EPI equation. This calculation has not been validated in all clinical situations. eGFR's persistently <60 mL/min signify possible Chronic Kidney Disease.     MICRO:  IMAGING: Dg Chest 2 View  Result Date: 12/14/2017 CLINICAL DATA:  Cough and shortness of breath. EXAM: CHEST  2 VIEW COMPARISON:  CT chest dated November 24, 2017. Chest x-ray dated August 21, 2017. FINDINGS: Unchanged right chest wall  port catheter with the tip in the mid SVC. The heart size and mediastinal contours are within normal limits. Normal pulmonary vascularity. Left basilar atelectasis. No focal consolidation, pleural effusion, or pneumothorax. No acute osseous abnormality. IMPRESSION: Left basilar atelectasis.  No active cardiopulmonary disease. Electronically Signed   By: Titus Dubin M.D.   On: 12/14/2017 17:01   Ct Angio Chest Pe W/cm &/or Wo Cm  Result Date: 12/14/2017 CLINICAL DATA:  Weakness over the past few days. States he had chemo 3 weeks ago and since then he has lost 15 lbs. Has reportedly had multiple reactions to the chemo. Stage 4 ca EXAM: CT ANGIOGRAPHY CHEST WITH CONTRAST TECHNIQUE: Multidetector CT imaging of the chest was performed using the standard protocol during bolus  administration of intravenous contrast. Multiplanar CT image reconstructions and MIPs were obtained to evaluate the vascular anatomy. CONTRAST:  32m ISOVUE-370 IOPAMIDOL (ISOVUE-370) INJECTION 76% COMPARISON:  CT chest, abdomen and pelvis, 11/24/2017. Current chest radiograph. FINDINGS: Cardiovascular: Contrast opacification of the pulmonary arteries is suboptimal, particularly to the mid to lower lung segmental and subsegmental vessels. This limits assessment for pulmonary emboli. Allowing for this, there is no evidence of a central pulmonary embolus. Heart is normal in size and configuration. No pericardial effusion. There are three-vessel coronary artery calcifications. The thoracic aorta is normal in caliber. No dissection. Mild atherosclerosis is noted along the aortic arch and descending thoracic aorta. Mediastinum/Nodes: Several prominent lymph nodes. There is a left paratracheal superior mediastinal node measuring 8 mm in short axis. There are anterior mediastinal lymph nodes, largest measuring 1.6 x 1.0 cm, lying adjacent to the left brachiocephalic vein. Allowing for differences in measurement technique, these are stable from the prior CT. There is increased soft tissue along the left hilar structures that is also stable. Trachea is widely patent. Esophagus is unremarkable. Lungs/Pleura: There is consolidation in the posteromedial left lower lobe. The dense consolidation is surrounded by hazy ground-glass opacity. There is bronchial wall thickening of the lower lobe segmental bronchi. There is a smaller area of consolidation in the posterolateral left upper lobe centered on image 81, series 10. There small nodular components to this. There is a spiculated nodule in the subpleural left lower lobe measuring 10 x 8 cm, which was present previously. There are no other discrete lung nodules. There are no other areas of consolidation. There are stable areas of peripheral interstitial thickening/scarring as well  as upper lobe predominant emphysema. Mild subsegmental atelectasis is noted in the right lower lobe. No pleural effusion.  No pneumothorax. Upper Abdomen: No acute findings. Musculoskeletal: No fracture or acute finding. No osteoblastic or osteolytic lesions. Review of the MIP images confirms the above findings. IMPRESSION: 1. Suboptimal exam limiting assessment for smaller pulmonary emboli. Allowing for this, there is no evidence of a pulmonary embolus. 2. Extensive left lower lobe consolidation consistent with pneumonia. 3. Findings of metastatic carcinoma noted on the prior chest, abdomen and pelvis CT are similar. Aortic Atherosclerosis (ICD10-I70.0) and Emphysema (ICD10-J43.9). Electronically Signed   By: DLajean ManesM.D.   On: 12/14/2017 19:56    HISTORICAL MICRO/IMAGING  Assessment/Plan:  89yoM with stage 4 NSCLCa currently undergoing chemo presents with lethargy, chills, cough found to have pneumonia with MSSA bacteremia.concerning that he has indwelling portacath that is also involved in the infection. He appeared to have cough in the past week which may have been the primary, then developed secondary bacteremia.  - will narrow his abtx to cefazolin 2gm iv q 8hr. aniticpate a  minimum of 2 wk of IV abtx but need to rule out endocarditis - recommend to contact IR to remove his port-a-cath. Pt is on xarelto. - please hold off on placing new picc line access until we document blood cx not having any further signs of bacteremia - will to rule out for endocarditis please start with TTE.  Mucositis = continue with magic mouthwash  Moderate protein-caloric malnutrition = due to odonyphagia would still with soft puree food. Recommend that he has supplemental boost/ensure drinks x 4 per day. Consider nutritionist to evaluate if he is meeting his daily needs  NSCLCa stage 4 = will ultimately need central access for further chemo. At this time will need to document clearance of bacteremia before picc  line or repeat port.

## 2017-12-15 NOTE — Progress Notes (Signed)
Patient approved of wife being in room during admission process questioning

## 2017-12-15 NOTE — Progress Notes (Signed)
Patient was transferred from ED at 1615; alert and oriented x4; family member at bedside; vital signs was taken. Will monitor patient as protocol

## 2017-12-15 NOTE — Progress Notes (Signed)
PHARMACY - PHYSICIAN COMMUNICATION CRITICAL VALUE ALERT - BLOOD CULTURE IDENTIFICATION (BCID)  OSEIAS HORSEY is an 72 y.o. male who presented to [facility] on 12/14/2017 with a chief complaint of pna   Name of physician (or Provider) Contacted: myers  Current antibiotics: vanc and cefepime  Changes to prescribed antibiotics recommended:  none  No results found for this or any previous visit.  Dolly Rias RPh 12/15/2017, 1:41 PM Pager [phone]

## 2017-12-15 NOTE — Progress Notes (Addendum)
Patient ID: Nicholas Hale, male   DOB: [birth year], 72 y.o.   MRN: [id number]    PROGRESS NOTE    Nicholas Hale  DZH:[id number] DOB: Apr 10, 1946 DOA: 12/14/2017  PCP: [name], MD   Brief Narrative:  Patient 72 year old male with hypertension, hyperlipidemia, history of PE and DVT, lung cancer stage IV, malignant pericardial effusion, presented with several days duration of progressively worsening dyspnea worse with exertion, associated with productive cough of yellow sputum. CT findings consistent with left lower lobe pneumonia. Patient started on vancomycin and cefepime and TR[facility] asked to admit for further evaluation  Assessment & Plan:   Principal Problem:   Sepsis secondary to left lower lobe pneumonia, present on admission, unknown pathogen - Please note that patient meets criteria for sepsis with HR > 90, WBC 24, source left lower lobe pneumonia - We'll continue vancomycin and cefepime, day 2 - Follow-up on sputum culture, urine Legionella and strep pneumonia - Allow antitussives as needed - Provide bronchodilators as needed - Narrow down antibiotics as clinically indicated  Active Problems:   Anemia, of malignancy - No evidence of active bleeding, hemoglobin overall stable - CBC in the morning     Hyponatremia - Suspect prerenal etiology from poor oral intake in the setting of pneumonia - Provide IV fluids and repeat BMP in the morning    Stage IV lung cancer, with metastases - Follows with Dr. [name], Will notify him of patient's admission    History of PE and DVT - Continue Xarelto    Thrombocytopenia  - Likely chemotherapy-induced  - CBC in the morning     Transaminitis - Noticed slight bump in LFTs - Holding statin, repeat CMP in the morning  DVT prophylaxis:  Xarelto  Code Status:  full code  Family Communication: Patient  an wife at bedside  Disposition Plan: To be determined   Consultants:   None   Procedures:   None   Antimicrobials:    Vancomycin 12/14/2017 -->  Cefepime 12/14/2017 -->   Subjective: Patient reports feeling better but still tired, hurting with deep breaths.   Objective: Vitals:   12/15/17 0813 12/15/17 0900 12/15/17 1003 12/15/17 1100  BP: 139/73 127/69 125/72 133/79  Pulse: 91 89 87 86  Resp: (!) 27 (!) 27 (!) 28 (!) 25  Temp:      TempSrc:      SpO2: 95% 96% 93% 94%    Intake/Output Summary (Last 24 hours) at 12/15/2017 1110 Last data filed at 12/15/2017 0815 Gross per 24 hour  Intake 550 ml  Output -  Net 550 ml   There were no vitals filed for this visit.  Examination:  General exam: Appears calm and comfortable  Respiratory system: rhonchi is noted at left lower base, no wheezing noted  Cardiovascular system: S1 & S2 heard, RRR. No JVD, murmurs, rubs, gallops or clicks.  Gastrointestinal system: Abdomen is nondistended, soft and nontender. No organomegaly or masses felt. Normal bowel sounds heard. Central nervous system: Alert and oriented. No focal neurological deficits. Extremities: Symmetric 5 x 5 power. Psychiatry: Judgement and insight appear normal. Mood & affect appropriate.   Data Reviewed: I have personally reviewed following labs and imaging studies  CBC: Recent Labs  Lab 12/09/17 1124 12/14/17 1540  WBC 16.2* 24.1*  NEUTROABS 14.3* 20.0*  HGB 13.2 12.9*  HCT 40.9 39.6  MCV 101.9* 104.2*  PLT 75* 419*   Basic Metabolic Panel: Recent Labs  Lab 12/09/17 1124 12/14/17 1540 12/15/17 0721  NA  139 133*  --   K 4.8 4.0  --   CL 102 100*  --   CO2 29 23  --   GLUCOSE 126 142*  --   BUN 19 27*  --   CREATININE 1.20 1.17 1.04  CALCIUM 9.1 8.0*  --    Liver Function Tests: Recent Labs  Lab 12/09/17 1124 12/14/17 1540  AST 29 87*  ALT 31 81*  ALKPHOS 121 118  BILITOT 0.5 0.5  PROT 6.7 6.9  ALBUMIN 2.5* 2.5*   Cardiac Enzymes: Recent Labs  Lab 12/14/17 1540  TROPONINI <0.03   CBG: Recent Labs  Lab 12/14/17 1550  GLUCAP 141*   Urine  analysis:    Component Value Date/Time   COLORURINE YELLOW 12/14/2017 1721   APPEARANCEUR CLEAR 12/14/2017 1721   LABSPEC 1.017 12/14/2017 1721   PHURINE 5.0 12/14/2017 1721   GLUCOSEU NEGATIVE 12/14/2017 1721   HGBUR NEGATIVE 12/14/2017 1721   BILIRUBINUR NEGATIVE 12/14/2017 1721   KETONESUR NEGATIVE 12/14/2017 1721   PROTEINUR 100 (A) 12/14/2017 1721   UROBILINOGEN 0.2 05/06/2015 0935   NITRITE NEGATIVE 12/14/2017 1721   LEUKOCYTESUR NEGATIVE 12/14/2017 1721   Radiology Studies: Dg Chest 2 View Result Date: 12/14/2017 Left basilar atelectasis.  No active cardiopulmonary disease.   Ct Angio Chest Pe W/cm &/or Wo Cm Result Date: 12/14/2017 1. Suboptimal exam limiting assessment for smaller pulmonary emboli. Allowing for this, there is no evidence of a pulmonary embolus. 2. Extensive left lower lobe consolidation consistent with pneumonia. 3. Findings of metastatic carcinoma noted on the prior chest, abdomen and pelvis CT are similar.   Scheduled Meds: . atorvastatin  20 mg Oral q1800  . rivaroxaban  20 mg Oral Daily  . sucralfate  1 g Oral TID WC & HS   Continuous Infusions: . sodium chloride 75 mL/hr at 12/15/17 0822  . ceFEPime (MAXIPIME) IV Stopped (12/15/17 0815)  . vancomycin       LOS: 1 day   Time spent: 35 minutes   Faye Ramsay, MD [facility] Pager [phone]  If 7PM-7AM, please contact night-coverage [email or web address] Password [email or web address] 12/15/2017, 11:10 AM

## 2017-12-15 NOTE — ED Notes (Signed)
ED TO INPATIENT HANDOFF REPORT  Name/Age/Gender Nicholas Hale 72 y.o. male  Code Status    Code Status Orders  (From admission, onward)        Start     Ordered   12/15/17 0819  Full code  Continuous     12/15/17 0818    Code Status History    Date Active Date Inactive Code Status Order ID Comments User Context   08/21/2017 13:39 08/22/2017 17:20 Full Code [id number]  Truett Mainland, DO ED   02/01/2017 15:52 02/02/2017 03:10 Full Code [id number]  Arne Cleveland, MD [facility]   09/19/2016 22:36 09/23/2016 16:45 Full Code [id number]  Ivor Costa, MD Inpatient   05/15/2015 17:53 05/17/2015 13:50 Full Code [id number]  Frederik Pear, MD Inpatient   08/07/2014 10:00 08/08/2014 14:40 Full Code [id number]  Wilhelmina Mcardle, MD Inpatient   08/06/2014 14:51 08/07/2014 10:00 Full Code [id number]  Sandi Mariscal, MD Inpatient   08/05/2014 20:48 08/06/2014 14:51 Full Code [id number]  Marybelle Killings, MD Inpatient   08/05/2014 17:30 08/05/2014 20:48 Full Code [id number]  Doree Fudge, MD ED   05/23/2014 15:27 05/25/2014 17:18 Full Code [id number]  Kerin Salen, MD Inpatient    Advance Directive Documentation     Most Recent Value  Type of Advance Directive  Healthcare Power of Attorney, Living will  Pre-existing out of facility DNR order (yellow form or pink MOST form)  No data  "MOST" Form in Place?  No data      Home/SNF/Other Home  Chief Complaint Weakness  Level of Care/Admitting Diagnosis ED Disposition    ED Disposition Condition Comment   Admit  Hospital Area: [facility] [[id number]]  Level of Care: Telemetry [5]  Admit to tele based on following criteria: Monitor for Ischemic changes  Diagnosis: Pneumonia [227785]  Admitting Physician: Jani Gravel [[id number]]  Attending Physician: Jani Gravel [phone]  Estimated length of stay: past midnight tomorrow  Certification:: I certify this patient will need inpatient services for at least 2 midnights  PT Class (Do Not Modify): Inpatient [101]  PT Acc Code (Do Not Modify): Private [1]       Medical History Past Medical History:  Diagnosis Date  . Abdominal aortic aneurysm ([facility])   . Arthritis    "hips; lower spine" (05/24/2014)  . Chemotherapy induced neutropenia ([facility]) 03/31/2017  . Chronic fatigue 10/05/2016  . Encounter for antineoplastic chemotherapy 02/17/2017  . History of radiation therapy 10/05/17-10/25/17   left lung/chest 35 Gy in 14 fractions  . Hyperlipidemia   . Hypertension   . Lung cancer ([facility]) 09/25/2016  . PE (pulmonary embolism) 9/15  . Pericardial effusion 09/20/2016   Malignant effusion s/p pericardial drain  . Personal history of DVT (deep vein thrombosis) 9/15    Allergies Allergies  Allergen Reactions  . Lidocaine Swelling    "Hard white blisters" formed on tongue, swollen mouth    IV Location/Drains/Wounds Patient Lines/Drains/Airways Status   Active Line/Drains/Airways    Name:   Placement date:   Placement time:   Site:   Days:   Implanted Port 02/01/17 Right Chest   02/01/17    1525    Chest   317          Labs/Imaging Results for orders placed or performed during the hospital encounter of 12/14/17 (from the past 48 hour(s))  Basic metabolic panel     Status: Abnormal   Collection Time: 12/14/17  3:40 PM  Result Value Ref Range   Sodium 133 (L) 135 -  145 mmol/L   Potassium 4.0 3.5 - 5.1 mmol/L   Chloride 100 (L) 101 - 111 mmol/L   CO2 23 22 - 32 mmol/L   Glucose, Bld 142 (H) 65 - 99 mg/dL   BUN 27 (H) 6 - 20 mg/dL   Creatinine, Ser 1.17 0.61 - 1.24 mg/dL   Calcium 8.0 (L) 8.9 - 10.3 mg/dL   GFR calc non Af Amer >60 >60 mL/min   GFR calc Af Amer >60 >60 mL/min    Comment: (NOTE) The eGFR has been calculated using the CKD EPI equation. This calculation has not been validated in all clinical situations. eGFR's persistently <60 mL/min signify possible Chronic Kidney Disease.    Anion gap 10 5 - 15  CBC     Status: Abnormal   Collection Time: 12/14/17  3:40 PM  Result Value Ref  Range   WBC 24.1 (H) 4.0 - 10.5 K/uL   RBC 3.80 (L) 4.22 - 5.81 MIL/uL   Hemoglobin 12.9 (L) 13.0 - 17.0 g/dL   HCT 39.6 39.0 - 52.0 %   MCV 104.2 (H) 78.0 - 100.0 fL   MCH 33.9 26.0 - 34.0 pg   MCHC 32.6 30.0 - 36.0 g/dL   RDW 16.9 (H) 11.5 - 15.5 %   Platelets 103 (L) 150 - 400 K/uL    Comment: REPEATED TO VERIFY SPECIMEN CHECKED FOR CLOTS PLATELET COUNT CONFIRMED BY SMEAR   Troponin I     Status: None   Collection Time: 12/14/17  3:40 PM  Result Value Ref Range   Troponin I <0.03 <0.03 ng/mL  Hepatic function panel     Status: Abnormal   Collection Time: 12/14/17  3:40 PM  Result Value Ref Range   Total Protein 6.9 6.5 - 8.1 g/dL   Albumin 2.5 (L) 3.5 - 5.0 g/dL   AST 87 (H) 15 - 41 U/L   ALT 81 (H) 17 - 63 U/L   Alkaline Phosphatase 118 38 - 126 U/L   Total Bilirubin 0.5 0.3 - 1.2 mg/dL   Bilirubin, Direct 0.1 0.1 - 0.5 mg/dL   Indirect Bilirubin 0.4 0.3 - 0.9 mg/dL  D-dimer, quantitative     Status: Abnormal   Collection Time: 12/14/17  3:40 PM  Result Value Ref Range   D-Dimer, Quant 2.79 (H) 0.00 - 0.50 ug/mL-FEU    Comment: (NOTE) At the manufacturer cut-off of 0.50 ug/mL FEU, this assay has been documented to exclude PE with a sensitivity and negative predictive value of 97 to 99%.  At this time, this assay has not been approved by the FDA to exclude DVT/VTE. Results should be correlated with clinical presentation.   Differential     Status: Abnormal   Collection Time: 12/14/17  3:40 PM  Result Value Ref Range   Neutrophils Relative % 83 %   Lymphocytes Relative 8 %   Monocytes Relative 9 %   Eosinophils Relative 0 %   Basophils Relative 0 %   Neutro Abs 20.0 (H) 1.7 - 7.7 K/uL   Lymphs Abs 1.9 0.7 - 4.0 K/uL   Monocytes Absolute 2.2 (H) 0.1 - 1.0 K/uL   Eosinophils Absolute 0.0 0.0 - 0.7 K/uL   Basophils Absolute 0.0 0.0 - 0.1 K/uL   WBC Morphology TOXIC GRANULATION   CBG monitoring, ED     Status: Abnormal   Collection Time: 12/14/17  3:50 PM  Result  Value Ref Range   Glucose-Capillary 141 (H) 65 - 99 mg/dL  Culture, blood (routine x 2)  Status: None (Preliminary result)   Collection Time: 12/14/17  4:45 PM  Result Value Ref Range   Specimen Description BLOOD PORTA CATH [facility]    Special Requests      BOTTLES DRAWN AEROBIC AND ANAEROBIC Blood Culture adequate volume   Culture  Setup Time      GRAM POSITIVE COCCI AEROBIC BOTTLE ONLY Organism ID to follow CRITICAL RESULT CALLED TO, READ BACK BY AND VERIFIED WITH: E JACKSON 12/15/17 137P JW Performed at [facility], [place] [address], [place], [place] [address]    Culture GRAM POSITIVE COCCI    Report Status PENDING   Blood Culture ID Panel (Reflexed)     Status: Abnormal   Collection Time: 12/14/17  4:45 PM  Result Value Ref Range   Enterococcus species NOT DETECTED NOT DETECTED   Listeria monocytogenes NOT DETECTED NOT DETECTED   Staphylococcus species DETECTED (A) NOT DETECTED    Comment: CRITICAL RESULT CALLED TO, READ BACK BY AND VERIFIED WITH: E JACKSON 12/15/17 137P JW    Staphylococcus aureus DETECTED (A) NOT DETECTED    Comment: Methicillin (oxacillin) susceptible Staphylococcus aureus (MSSA). Preferred therapy is anti staphylococcal beta lactam antibiotic (Cefazolin or Nafcillin), unless clinically contraindicated. CRITICAL RESULT CALLED TO, READ BACK BY AND VERIFIED WITH: E JACKSON 12/15/17 137P JW    Methicillin resistance NOT DETECTED NOT DETECTED   Streptococcus species NOT DETECTED NOT DETECTED   Streptococcus agalactiae NOT DETECTED NOT DETECTED   Streptococcus pneumoniae NOT DETECTED NOT DETECTED   Streptococcus pyogenes NOT DETECTED NOT DETECTED   Acinetobacter baumannii NOT DETECTED NOT DETECTED   Enterobacteriaceae species NOT DETECTED NOT DETECTED   Enterobacter cloacae complex NOT DETECTED NOT DETECTED   Escherichia coli NOT DETECTED NOT DETECTED   Klebsiella oxytoca NOT DETECTED NOT DETECTED   Klebsiella pneumoniae NOT DETECTED NOT DETECTED   Proteus  species NOT DETECTED NOT DETECTED   Serratia marcescens NOT DETECTED NOT DETECTED   Haemophilus influenzae NOT DETECTED NOT DETECTED   Neisseria meningitidis NOT DETECTED NOT DETECTED   Pseudomonas aeruginosa NOT DETECTED NOT DETECTED   Candida albicans NOT DETECTED NOT DETECTED   Candida glabrata NOT DETECTED NOT DETECTED   Candida krusei NOT DETECTED NOT DETECTED   Candida parapsilosis NOT DETECTED NOT DETECTED   Candida tropicalis NOT DETECTED NOT DETECTED    Comment: Performed at [facility] [address] [place], [place] [address]  Urinalysis, Routine w reflex microscopic     Status: Abnormal   Collection Time: 12/14/17  5:21 PM  Result Value Ref Range   Color, Urine YELLOW YELLOW   APPearance CLEAR CLEAR   Specific Gravity, Urine 1.017 1.005 - 1.030   pH 5.0 5.0 - 8.0   Glucose, UA NEGATIVE NEGATIVE mg/dL   Hgb urine dipstick NEGATIVE NEGATIVE   Bilirubin Urine NEGATIVE NEGATIVE   Ketones, ur NEGATIVE NEGATIVE mg/dL   Protein, ur 100 (A) NEGATIVE mg/dL   Nitrite NEGATIVE NEGATIVE   Leukocytes, UA NEGATIVE NEGATIVE   RBC / HPF 0-5 0 - 5 RBC/hpf   WBC, UA 0-5 0 - 5 WBC/hpf   Bacteria, UA NONE SEEN NONE SEEN   Squamous Epithelial / LPF NONE SEEN NONE SEEN   Mucus PRESENT    Hyaline Casts, UA PRESENT   Influenza panel by PCR (type A & B)     Status: None   Collection Time: 12/14/17  8:45 PM  Result Value Ref Range   Influenza A By PCR NEGATIVE NEGATIVE   Influenza B By PCR  NEGATIVE NEGATIVE    Comment: (NOTE) The Xpert Xpress Flu assay is intended as an aid in the diagnosis of  influenza and should not be used as a sole basis for treatment.  This  assay is FDA approved for nasopharyngeal swab specimens only. Nasal  washings and aspirates are unacceptable for Xpert Xpress Flu testing.   I-Stat CG4 Lactic Acid, ED     Status: None   Collection Time: 12/14/17 11:17 PM  Result Value Ref Range   Lactic Acid, Venous 1.89 0.5 - 1.9 mmol/L  Creatinine, serum      Status: None   Collection Time: 12/15/17  7:21 AM  Result Value Ref Range   Creatinine, Ser 1.04 0.61 - 1.24 mg/dL   GFR calc non Af Amer >60 >60 mL/min   GFR calc Af Amer >60 >60 mL/min    Comment: (NOTE) The eGFR has been calculated using the CKD EPI equation. This calculation has not been validated in all clinical situations. eGFR's persistently <60 mL/min signify possible Chronic Kidney Disease.    Dg Chest 2 View  Result Date: 12/14/2017 CLINICAL DATA:  Cough and shortness of breath. EXAM: CHEST  2 VIEW COMPARISON:  CT chest dated November 24, 2017. Chest x-ray dated August 21, 2017. FINDINGS: Unchanged right chest wall port catheter with the tip in the mid SVC. The heart size and mediastinal contours are within normal limits. Normal pulmonary vascularity. Left basilar atelectasis. No focal consolidation, pleural effusion, or pneumothorax. No acute osseous abnormality. IMPRESSION: Left basilar atelectasis.  No active cardiopulmonary disease. Electronically Signed   By: Titus Dubin M.D.   On: 12/14/2017 17:01   Ct Angio Chest Pe W/cm &/or Wo Cm  Result Date: 12/14/2017 CLINICAL DATA:  Weakness over the past few days. States he had chemo 3 weeks ago and since then he has lost 15 lbs. Has reportedly had multiple reactions to the chemo. Stage 4 ca EXAM: CT ANGIOGRAPHY CHEST WITH CONTRAST TECHNIQUE: Multidetector CT imaging of the chest was performed using the standard protocol during bolus administration of intravenous contrast. Multiplanar CT image reconstructions and MIPs were obtained to evaluate the vascular anatomy. CONTRAST:  26m ISOVUE-370 IOPAMIDOL (ISOVUE-370) INJECTION 76% COMPARISON:  CT chest, abdomen and pelvis, 11/24/2017. Current chest radiograph. FINDINGS: Cardiovascular: Contrast opacification of the pulmonary arteries is suboptimal, particularly to the mid to lower lung segmental and subsegmental vessels. This limits assessment for pulmonary emboli. Allowing for this,  there is no evidence of a central pulmonary embolus. Heart is normal in size and configuration. No pericardial effusion. There are three-vessel coronary artery calcifications. The thoracic aorta is normal in caliber. No dissection. Mild atherosclerosis is noted along the aortic arch and descending thoracic aorta. Mediastinum/Nodes: Several prominent lymph nodes. There is a left paratracheal superior mediastinal node measuring 8 mm in short axis. There are anterior mediastinal lymph nodes, largest measuring 1.6 x 1.0 cm, lying adjacent to the left brachiocephalic vein. Allowing for differences in measurement technique, these are stable from the prior CT. There is increased soft tissue along the left hilar structures that is also stable. Trachea is widely patent. Esophagus is unremarkable. Lungs/Pleura: There is consolidation in the posteromedial left lower lobe. The dense consolidation is surrounded by hazy ground-glass opacity. There is bronchial wall thickening of the lower lobe segmental bronchi. There is a smaller area of consolidation in the posterolateral left upper lobe centered on image 81, series 10. There small nodular components to this. There is a spiculated nodule in the subpleural left lower  lobe measuring 10 x 8 cm, which was present previously. There are no other discrete lung nodules. There are no other areas of consolidation. There are stable areas of peripheral interstitial thickening/scarring as well as upper lobe predominant emphysema. Mild subsegmental atelectasis is noted in the right lower lobe. No pleural effusion.  No pneumothorax. Upper Abdomen: No acute findings. Musculoskeletal: No fracture or acute finding. No osteoblastic or osteolytic lesions. Review of the MIP images confirms the above findings. IMPRESSION: 1. Suboptimal exam limiting assessment for smaller pulmonary emboli. Allowing for this, there is no evidence of a pulmonary embolus. 2. Extensive left lower lobe consolidation  consistent with pneumonia. 3. Findings of metastatic carcinoma noted on the prior chest, abdomen and pelvis CT are similar. Aortic Atherosclerosis (ICD10-I70.0) and Emphysema (ICD10-J43.9). Electronically Signed   By: Lajean Manes M.D.   On: 12/14/2017 19:56    Pending Labs Unresulted Labs (From admission, onward)   Start     Ordered   12/16/17 0500  CBC  Tomorrow morning,   R     12/15/17 1127   12/16/17 0500  Comprehensive metabolic panel  Tomorrow morning,   R     12/15/17 1127   12/14/17 2027  Culture, blood (routine x 2)  BLOOD CULTURE X 2,   STAT     12/14/17 2026   Signed and Held  HIV antibody  Once,   R     Signed and Held   Signed and Held  Culture, blood (routine x 2) Call MD if unable to obtain prior to antibiotics being given  BLOOD CULTURE X 2,   R    Comments:  If blood cultures drawn in Emergency Department - Do not draw and cancel order   Question:  Patient immune status  Answer:  Normal   Signed and Held   Signed and Held  Culture, sputum-assessment  Once,   R    Question:  Patient immune status  Answer:  Normal   Signed and Held   Signed and Held  Gram stain  Once,   R    Question:  Patient immune status  Answer:  Normal   Signed and Held   Signed and Held  Strep pneumoniae urinary antigen  Once,   R     Signed and Held   Signed and Held  Legionella Pneumophila Serogp 1 Ur Ag  Once,   R     Signed and Held      Vitals/Pain Today's Vitals   12/15/17 1300 12/15/17 1335 12/15/17 1400 12/15/17 1444  BP: 137/77 137/77 135/77 135/77  Pulse: 86 81 85 80  Resp: (!) 30 (!) 26 (!) 28 (!) 21  Temp:      TempSrc:      SpO2: 92% 92% 94% 93%  PainSc:        Isolation Precautions No active isolations  Medications Medications  albuterol (PROVENTIL HFA;VENTOLIN HFA) 108 (90 Base) MCG/ACT inhaler 2 puff (not administered)  oxyCODONE (ROXICODONE) 5 MG/5ML solution 5 mg (5 mg Oral Given 12/15/17 0924)  sodium chloride (OCEAN) 0.65 % nasal spray 2 spray (not  administered)  sucralfate (CARAFATE) tablet 1 g (0 g Oral Hold 12/15/17 1159)  rivaroxaban (XARELTO) tablet 20 mg (not administered)  0.9 %  sodium chloride infusion ( Intravenous New Bag/Given 12/15/17 1155)  ipratropium-albuterol (DUONEB) 0.5-2.5 (3) MG/3ML nebulizer solution 3 mL (not administered)  guaiFENesin-dextromethorphan (ROBITUSSIN DM) 100-10 MG/5ML syrup 5 mL (not administered)  ceFAZolin (ANCEF) IVPB 2g/100 mL premix (2 g Intravenous  New Bag/Given 12/15/17 1534)  sodium chloride 0.9 % bolus 1,000 mL (0 mLs Intravenous Stopped 12/14/17 1935)  iopamidol (ISOVUE-370) 76 % injection (100 mLs  Contrast Given 12/14/17 1922)  sodium chloride 0.9 % bolus 500 mL (0 mLs Intravenous Stopped 12/15/17 0724)  ceFEPIme (MAXIPIME) 2 g in dextrose 5 % 50 mL IVPB (0 g Intravenous Stopped 12/14/17 2207)  vancomycin (VANCOCIN) 1,500 mg in sodium chloride 0.9 % 500 mL IVPB (0 mg Intravenous Stopped 12/15/17 0138)    Mobility walks with person assist

## 2017-12-15 NOTE — ED Notes (Signed)
Patient placed in a hospital bed for comfort and wife placed in a geri chair for comfort. Pillows and warm blankets for both patient and wife.

## 2017-12-16 ENCOUNTER — Inpatient Hospital Stay (HOSPITAL_COMMUNITY): Payer: Medicare Other

## 2017-12-16 ENCOUNTER — Other Ambulatory Visit: Payer: Medicare Other

## 2017-12-16 ENCOUNTER — Ambulatory Visit: Payer: Medicare Other

## 2017-12-16 ENCOUNTER — Ambulatory Visit: Payer: Medicare Other | Admitting: Oncology

## 2017-12-16 ENCOUNTER — Encounter: Payer: Medicare Other | Admitting: Nutrition

## 2017-12-16 DIAGNOSIS — I361 Nonrheumatic tricuspid (valve) insufficiency: Secondary | ICD-10-CM

## 2017-12-16 LAB — COMPREHENSIVE METABOLIC PANEL
ALBUMIN: 2 g/dL — AB (ref 3.5–5.0)
ALT: 72 U/L — ABNORMAL HIGH (ref 17–63)
AST: 73 U/L — AB (ref 15–41)
Alkaline Phosphatase: 102 U/L (ref 38–126)
Anion gap: 6 (ref 5–15)
BUN: 17 mg/dL (ref 6–20)
CHLORIDE: 108 mmol/L (ref 101–111)
CO2: 25 mmol/L (ref 22–32)
Calcium: 7.8 mg/dL — ABNORMAL LOW (ref 8.9–10.3)
Creatinine, Ser: 1.02 mg/dL (ref 0.61–1.24)
GFR calc Af Amer: >60 mL/min (ref >60)
GLUCOSE: 105 mg/dL — AB (ref 65–99)
POTASSIUM: 4.1 mmol/L (ref 3.5–5.1)
SODIUM: 139 mmol/L (ref 135–145)
Total Bilirubin: 0.4 mg/dL (ref 0.3–1.2)
Total Protein: 5.8 g/dL — ABNORMAL LOW (ref 6.5–8.1)

## 2017-12-16 LAB — HIV ANTIBODY (ROUTINE TESTING W REFLEX): HIV SCREEN 4TH GENERATION: NONREACTIVE

## 2017-12-16 LAB — CBC
HCT: 35.1 % — ABNORMAL LOW (ref 39.0–52.0)
Hemoglobin: 10.9 g/dL — ABNORMAL LOW (ref 13.0–17.0)
MCH: 32.3 pg (ref 26.0–34.0)
MCHC: 31.1 g/dL (ref 30.0–36.0)
MCV: 104.2 fL — ABNORMAL HIGH (ref 78.0–100.0)
PLATELETS: 72 10*3/uL — AB (ref 150–400)
RBC: 3.37 MIL/uL — ABNORMAL LOW (ref 4.22–5.81)
RDW: 16.9 % — AB (ref 11.5–15.5)
WBC: 14.8 10*3/uL — AB (ref 4.0–10.5)

## 2017-12-16 LAB — STREP PNEUMONIAE URINARY ANTIGEN: STREP PNEUMO URINARY ANTIGEN: NEGATIVE

## 2017-12-16 LAB — ECHOCARDIOGRAM COMPLETE
Height: 70 in
Weight: 2976 oz

## 2017-12-16 MED ORDER — ENSURE ENLIVE PO LIQD
237.0000 mL | Freq: Two times a day (BID) | ORAL | Status: DC
  Administered 2017-12-16 – 2017-12-24 (×9): 237 mL via ORAL

## 2017-12-16 MED ORDER — IPRATROPIUM-ALBUTEROL 0.5-2.5 (3) MG/3ML IN SOLN: 3.0000 mL | RESPIRATORY_TRACT | Status: DC | PRN

## 2017-12-16 MED ORDER — RIVAROXABAN 20 MG PO TABS
20.0000 mg | ORAL_TABLET | Freq: Every day | ORAL | Status: DC
  Administered 2017-12-17 – 2017-12-18 (×2): 20 mg via ORAL
  Filled 2017-12-16 (×2): qty 1

## 2017-12-16 NOTE — Care Management Note (Signed)
Case Management Note  Patient Details  Name: Nicholas Hale MRN: [id number] Date of Birth: 11/14/1946  Subjective/Objective:                  pna in patient with hx of stage 4 lung ca  Action/Plan: Date: December 16, 2017 Velva Harman, BSN, [place], [place] Chart and notes review for patient progress and needs. Will follow for case management and discharge needs. No cm or discharge needs present at time of this review. Next review date: [id number]  Expected Discharge Date:  (unknown)               Expected Discharge Plan:  Home/Self Care  In-House Referral:     Discharge planning Services  CM Consult  Post Acute Care Choice:    Choice offered to:     DME Arranged:    DME Agency:     HH Arranged:    HH Agency:     Status of Service:  In process, will continue to follow  If discussed at Long Length of Stay Meetings, dates discussed:    Additional Comments:  Leeroy Cha, RN 12/16/2017, 9:11 AM

## 2017-12-16 NOTE — Progress Notes (Signed)
Initial Nutrition Assessment  DOCUMENTATION CODES:   Non-severe (moderate) malnutrition in context of chronic illness  INTERVENTION:   - Ensure Enlive po BID, each supplement provides 350 kcal and 20 grams of protein - Continue to encourage PO intake  NUTRITION DIAGNOSIS:   Moderate Malnutrition related to chronic illness(stage 4 lung cancer w/ mets) as evidenced by mild muscle depletion, mild fat depletion, percent weight loss.  GOAL:   Patient will meet greater than or equal to 90% of their needs  MONITOR:   PO intake, Supplement acceptance, Weight trends  REASON FOR ASSESSMENT:   Malnutrition Screening Tool   ASSESSMENT:   72 yo male presented to ED with complaints of increased weakness, dyspnea, and weight loss of 15 lbs over 3 weeks. Pt currently receiving chemo for stage 4 lung cancer with mets. Admitted for sepsis secondary to pneumonia. PMH includes HTN, PE, DVT, malignant pericardial effusion, and hyperlipidemia. Pt with poor PO intake due to discomfort upon eating.  Pt states that his appetite has improved since admission. PO intake has been documented as 75-100% since admission. Pt with a 50-75% completed lunch tray in room at time of visit: meatloaf, mashed potatoes, broccoli with butter, and two small sodas. Diet order changed from soft to regular on 12/16/17.  Pt and pt's wife describe poor appetite and intake for the past 3 to 4 weeks PTA. Pt states that it hurt to eat due to mouth sores from chemo and that he was more easily able to consume liquids like ice cream, milkshakes, Boost, and pureed foods. Pt's wife states she tracked calories and was often only able to get pt to eat [phone] calories/day. Prior to experiencing pain upon eating, pt would consume 2-3 meals daily. Breakfast may include two sausage biscuits. Lunch may include a ham sandwich. Dinner may include pork chops with sides. Pt agreeable to receiving Ensure Enlive oral nutrition supplement BID between  meals.  Pt states that his UBW is 218 lbs and that he last weighed this at the end of 2017. Per weight history, pt weight 198 lbs on 11/24/17 and 186 lbs on admission. This is a 6% weight loss in 1 month which is significant for timeframe. Pt and pt's wife report that pt has visibly lost muscle mass.  Discussed high-protein, high-calorie nutrition therapy with pt's wife. Provided pt and pt's wife with three handouts from the [facility]: High-Calorie, High-Protein Nutrition Therapy; High-Calorie, High-Protein Recipes; and Suggestions for Increasing Calories and Protein. Reviewed handouts with pt's wife.  Medications reviewed.  Labs reviewed: glucose 105 (H), calcium 7.8 (L), AST 73 (H), ALT 72 (H) CBG: 141 (12/14/17)  NUTRITION - FOCUSED PHYSICAL EXAM:    Most Recent Value  Orbital Region  Mild depletion  Upper Arm Region  No depletion  Thoracic and Lumbar Region  No depletion  Buccal Region  Mild depletion  Temple Region  Mild depletion  Clavicle Bone Region  No depletion  Clavicle and Acromion Bone Region  No depletion  Scapular Bone Region  Unable to assess  Dorsal Hand  Mild depletion  Patellar Region  Mild depletion  Anterior Thigh Region  Mild depletion  Posterior Calf Region  Mild depletion  Edema (RD Assessment)  None  Hair  Reviewed  Eyes  Reviewed  Mouth  Reviewed  Skin  Reviewed  Nails  Reviewed       Diet Order:  Diet regular Room service appropriate? Yes; Fluid consistency: Thin  EDUCATION NEEDS:   Education needs have been  addressed  Skin:  Skin Assessment: Reviewed RN Assessment  Last BM:  12/16/17  Height:   Ht Readings from Last 1 Encounters:  12/15/17 5\' 10"  (1.778 m)    Weight:   Wt Readings from Last 1 Encounters:  12/15/17 186 lb (84.4 kg)    Ideal Body Weight:  75.5 kg  BMI:  Body mass index is 26.69 kg/m.  Estimated Nutritional Needs:   Kcal:  2000-2200 kcal/day  Protein:  105-115 grams/day  Fluid:  >2  L/day    Gaynell Face, MS, RD, LDN Pager: [phone] Weekend/After Hours: [phone]

## 2017-12-16 NOTE — Progress Notes (Signed)
  Echocardiogram 2D Echocardiogram has been performed.  Nicholas Hale 12/16/2017, 3:43 PM

## 2017-12-16 NOTE — Progress Notes (Signed)
  Echocardiogram 2D Echocardiogram has been performed.  Dryden Tapley T Coyt Govoni 12/16/2017, 3:42 PM

## 2017-12-16 NOTE — Progress Notes (Signed)
Patient ID: Nicholas Hale, male   DOB: December 25, 1945, 72 y.o.   MRN: [id number]    Referring Physician(s): Myers,I/Snider,C  Supervising Physician: Jacqulynn Cadet  Patient Status:  [facility] - In-pt  Chief Complaint:  bacteremia  Subjective: Patient familiar to IR service from prior bilateral PE lysis in 2015, and right chest wall Port-A-Cath placement on 02/01/17.  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  Request now received for Port-A-Cath removal.  He currently denies fever, headache, worsening chest pain, abdominal/back pain, nausea, vomiting or bleeding.  He does have some dyspnea and occasional cough. Past Medical History:  Diagnosis Date  . Abdominal aortic aneurysm ([place])   . Arthritis    "hips; lower spine" (05/24/2014)  . Chemotherapy induced neutropenia ([place]) 03/31/2017  . Chronic fatigue 10/05/2016  . Encounter for antineoplastic chemotherapy 02/17/2017  . History of radiation therapy 10/05/17-10/25/17   left lung/chest 35 Gy in 14 fractions  . Hyperlipidemia   . Hypertension   . Lung cancer ([place]) 09/25/2016  . PE (pulmonary embolism) 9/15  . Pericardial effusion 09/20/2016   Malignant effusion s/p pericardial drain  . Personal history of DVT (deep vein thrombosis) 9/15   Past Surgical History:  Procedure Laterality Date  . ANTERIOR FUSION CERVICAL SPINE  ~ 2005  . APPENDECTOMY  1978  . CARDIAC CATHETERIZATION N/A 09/21/2016   Procedure: Pericardiocentesis;  Surgeon: Jettie Booze, MD;  Location: [place] [facility];  Service: Cardiovascular;  Laterality: N/A;  . COLONOSCOPY    . HEMIARTHROPLASTY SHOULDER FRACTURE Left 12/2008  . INGUINAL HERNIA REPAIR Bilateral 1992  . IR GENERIC HISTORICAL  02/01/2017   IR FLUORO GUIDE PORT INSERTION RIGHT 02/01/2017 Arne Cleveland, MD [facility]INTER[facility] RAD  . IR GENERIC HISTORICAL  02/01/2017   IR US GUIDE VASC  ACCESS RIGHT 02/01/2017 Arne Cleveland, MD [facility]  . JOINT REPLACEMENT    . PERICARDIAL FLUID DRAINAGE  09/21/2016  . TOTAL HIP ARTHROPLASTY Left 05/23/2014   Procedure: TOTAL HIP ARTHROPLASTY;  Surgeon: Kerin Salen, MD;  Location: [place];  Service: Orthopedics;  Laterality: Left;  . TOTAL HIP ARTHROPLASTY Right 05/15/2015   Procedure: TOTAL HIP ARTHROPLASTY;  Surgeon: Frederik Pear, MD;  Location: [place];  Service: Orthopedics;  Laterality: Right;      Allergies: Lidocaine  Medications: Prior to Admission medications   Medication Sig Start Date End Date Taking? Authorizing Provider  albuterol (PROVENTIL HFA;VENTOLIN HFA) 108 (90 Base) MCG/ACT inhaler Inhale 2 puffs into the lungs every 4 (four) hours as needed for wheezing or shortness of breath. 07/27/17  Yes Byrum, Rose Fillers, MD  atorvastatin (LIPITOR) 20 MG tablet TAKE 1 TABLET BY MOUTH EVERY DAY AT 6PM 10/12/17  Yes End, Harrell Gave, MD  Dextromethorphan-Guaifenesin (MUCINEX DM PO) Take 20 mLs by mouth 2 (two) times daily as needed (CHEST CONGESTION).   Yes [provider]  diphenhydrAMINE (BENADRYL) 25 MG tablet Take 509 mg by mouth at bedtime as needed for sleep.   Yes [provider]  lidocaine (XYLOCAINE) 2 % solution Use as directed 5 mLs in the mouth or throat every 3 (three) hours as needed for mouth pain. 12/02/17  Yes Tanner, Lyndon Code., PA-C  lidocaine-prilocaine (EMLA) cream Apply 1 application topically as needed (on port site).    Yes [provider]  magic mouthwash SOLN Take 5 mLs by mouth 4 (four) times daily. Prednisone 80 mg ,  Nystatin 30 ml, Benadryl 12.5 mg/5 ml. Mix to 240 ml, 1:1:1 concentration. 11/24/17  Yes [name], MD  oxyCODONE (ROXICODONE) 5 MG/5ML solution Take 5 mLs (5 mg total) by mouth every 4 (four) hours as needed for severe pain. 12/02/17  Yes Tanner, Lyndon Code., PA-C  sodium chloride (OCEAN) 0.65 % SOLN nasal spray Place 2 sprays into both nostrils as needed for congestion.    Yes [provider]  sucralfate (CARAFATE) 1 g tablet Take 1 tablet (1 g total) by mouth 4 (four) times daily -  with meals and at bedtime. Crush and dissolve in 10 cc of water prior to swallowing 10/25/17  Yes Gery Pray, MD  XARELTO 20 MG TABS tablet TAKE 1 TABLET BY MOUTH EVERY DAY 11/17/17  Yes Byrum, Rose Fillers, MD  chlorpheniramine-HYDROcodone (TUSSIONEX) 10-8 MG/5ML SUER Take 5 mLs at bedtime as needed by mouth for cough. Patient not taking: Reported on 12/14/2017 10/12/17   Gery Pray, MD  dexamethasone (DECADRON) 4 MG tablet 4 mg by mouth twice a day the day before, day of and day after the chemotherapy every 3 weeks. 02/17/17   [name], MD  oxyCODONE-acetaminophen (PERCOCET/ROXICET) 5-325 MG tablet Take 1 tablet by mouth every 4 (four) hours as needed for severe pain. Patient not taking: Reported on 12/02/2017 10/25/17   Gery Pray, MD  prochlorperazine (COMPAZINE) 10 MG tablet Take 1 tablet (10 mg total) by mouth every 6 (six) hours as needed for nausea or vomiting. Patient not taking: Reported on 12/02/2017 11/24/17   [name], MD     Vital Signs: BP 130/71 (BP Location: Left Arm)   Pulse 78   Temp 98.5 F (36.9 C) (Oral)   Resp 18   Ht 5\' 10"  (1.778 m)   Wt 186 lb (84.4 kg)   SpO2 96%   BMI 26.69 kg/m   Physical Exam awake, alert.  Chest with some left basilar crackles, right clear.  Clean, intact nontender right chest wall Port-A-Cath.  Heart with regular rate and rhythm; abd soft, positive bowel sounds, nontender.  Imaging: Dg Chest 2 View  Result Date: 12/14/2017 CLINICAL DATA:  Cough and shortness of breath. EXAM: CHEST  2 VIEW COMPARISON:  CT chest dated November 24, 2017. Chest x-ray dated August 21, 2017. FINDINGS: Unchanged right chest wall port catheter with the tip in the mid SVC. The heart size and mediastinal contours are within normal limits. Normal pulmonary vascularity. Left basilar atelectasis. No focal consolidation, pleural  effusion, or pneumothorax. No acute osseous abnormality. IMPRESSION: Left basilar atelectasis.  No active cardiopulmonary disease. Electronically Signed   By: Titus Dubin M.D.   On: 12/14/2017 17:01   Ct Angio Chest Pe W/cm &/or Wo Cm  Result Date: 12/14/2017 CLINICAL DATA:  Weakness over the past few days. States he had chemo 3 weeks ago and since then he has lost 15 lbs. Has reportedly had multiple reactions to the chemo. Stage 4 ca EXAM: CT ANGIOGRAPHY CHEST WITH CONTRAST TECHNIQUE: Multidetector CT imaging of the chest was performed using the standard protocol during bolus administration of intravenous contrast. Multiplanar CT image reconstructions and MIPs were obtained to evaluate the vascular anatomy. CONTRAST:  73mL ISOVUE-370 IOPAMIDOL (ISOVUE-370) INJECTION 76% COMPARISON:  CT chest, abdomen and pelvis, 11/24/2017. Current chest radiograph. FINDINGS: Cardiovascular: Contrast opacification of the pulmonary arteries is suboptimal, particularly to the mid to lower lung segmental and subsegmental vessels. This limits assessment for pulmonary emboli. Allowing for this, there is no evidence of a central pulmonary embolus.  Heart is normal in size and configuration. No pericardial effusion. There are three-vessel coronary artery calcifications. The thoracic aorta is normal in caliber. No dissection. Mild atherosclerosis is noted along the aortic arch and descending thoracic aorta. Mediastinum/Nodes: Several prominent lymph nodes. There is a left paratracheal superior mediastinal node measuring 8 mm in short axis. There are anterior mediastinal lymph nodes, largest measuring 1.6 x 1.0 cm, lying adjacent to the left brachiocephalic vein. Allowing for differences in measurement technique, these are stable from the prior CT. There is increased soft tissue along the left hilar structures that is also stable. Trachea is widely patent. Esophagus is unremarkable. Lungs/Pleura: There is consolidation in the  posteromedial left lower lobe. The dense consolidation is surrounded by hazy ground-glass opacity. There is bronchial wall thickening of the lower lobe segmental bronchi. There is a smaller area of consolidation in the posterolateral left upper lobe centered on image 81, series 10. There small nodular components to this. There is a spiculated nodule in the subpleural left lower lobe measuring 10 x 8 cm, which was present previously. There are no other discrete lung nodules. There are no other areas of consolidation. There are stable areas of peripheral interstitial thickening/scarring as well as upper lobe predominant emphysema. Mild subsegmental atelectasis is noted in the right lower lobe. No pleural effusion.  No pneumothorax. Upper Abdomen: No acute findings. Musculoskeletal: No fracture or acute finding. No osteoblastic or osteolytic lesions. Review of the MIP images confirms the above findings. IMPRESSION: 1. Suboptimal exam limiting assessment for smaller pulmonary emboli. Allowing for this, there is no evidence of a pulmonary embolus. 2. Extensive left lower lobe consolidation consistent with pneumonia. 3. Findings of metastatic carcinoma noted on the prior chest, abdomen and pelvis CT are similar. Aortic Atherosclerosis (ICD10-I70.0) and Emphysema (ICD10-J43.9). Electronically Signed   By: Lajean Manes M.D.   On: 12/14/2017 19:56    Labs:  CBC: Recent Labs    12/02/17 1055 12/09/17 1124 12/14/17 1540 12/16/17 0646  WBC 5.1 16.2* 24.1* 14.8*  HGB 15.8 13.2 12.9* 10.9*  HCT 48.4 40.9 39.6 35.1*  PLT 48* 75* 103* 72*    COAGS: Recent Labs    02/01/17 1244  INR 1.04  APTT 38*    BMP: Recent Labs    08/21/17 0926  12/02/17 1055 12/09/17 1124 12/14/17 1540 12/15/17 0721 12/16/17 0646  NA 138   < > 139 139 133*  --  139  K 4.4   < > 4.6 4.8 4.0  --  4.1  CL 106  --   --  102 100*  --  108  CO2 26   < > 26 29 23   --  25  GLUCOSE 108*   < > 117 126 142*  --  105*  BUN 21*   <  > 31.8* 19 27*  --  17  CALCIUM 8.5*   < > 9.4 9.1 8.0*  --  7.8*  CREATININE 1.15   < > 1.6* 1.20 1.17 1.04 1.02  GFRNONAA >60  --   --  59* >60 >60 >60  GFRAA >60  --   --  >60 >60 >60 >60   < > = values in this interval not displayed.    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  Request now received for Port-A-Cath removal.  Details/risks of procedure, including but not limited to, internal bleeding, infection, injury to adjacent structures discussed with patient and family with their understanding and consent.  Xarelto will be held until after procedure.  Procedure scheduled for 1/18.   Electronically Signed: D. Rowe Robert, PA-C 12/16/2017, 3:58 PM   I spent a total of 20 minutes at the the patient's bedside AND on the patient's hospital floor or unit, greater than 50% of which was counseling/coordinating care for port a cath removal

## 2017-12-16 NOTE — Progress Notes (Signed)
[facility] Infusion Coordinator will folllw pt with ID team to support home IV ABX as ordered by team.   If patient discharges after hours, please call [phone].   Larry Sierras 12/16/2017, 9:37 AM

## 2017-12-16 NOTE — Progress Notes (Signed)
Patient ID: Nicholas Hale, male   DOB: 1946-05-20, 72 y.o.   MRN: [id number]    PROGRESS NOTE  Nicholas Hale  EXB:[id number] DOB: October 22, 1946 DOA: 12/14/2017  PCP: [name], MD   Brief Narrative:  Patient 72 year old male with hypertension, hyperlipidemia, history of PE and DVT, lung cancer stage IV, malignant pericardial effusion, presented with several days duration of progressively worsening dyspnea worse with exertion, associated with productive cough of yellow sputum. CT findings consistent with left lower lobe pneumonia. Patient started on vancomycin and cefepime and TRH asked to admit for further evaluation  Assessment & Plan:   Principal Problem:   Sepsis secondary to left lower lobe pneumonia, MSSA bacteremia  - Please note that patient met criteria for sepsis with HR > 90, WBC 24, source left lower lobe pneumonia, MSSA bacteremia suspected from portacath - pt was started initially on vancomycin and cefepime, ID team has been consulted and ABX changed to Cefazolin 2gm IV Q8 hours - will need to rule out endocarditis, ECHO done, TEE will be scheduled for Monday  - IR consulted for removal of port-a-cath  - will hold off on placing new PICC line access until we document clearance of the bacteremia      Mucositis - continue magic mouthwash   Active Problems:   Anemia, of malignancy - Hg drop overnight, likely dilutional  - no evidence of active bleeding - CBC in AM     Hyponatremia - Suspect prerenal etiology from poor oral intake in the setting of pneumonia - IVF provided and Na is now WNL    Stage IV lung cancer, with metastases - Follows with Dr. [name] - I have notified him of pt's admission     History of PE and DVT - Continue Xarelto    Thrombocytopenia  - Likely chemotherapy-induced  - will monitor     Transaminitis - Noticed slight bump in LFTs - continue to hold statin   DVT prophylaxis:  Xarelto  Code Status:  full code  Family  Communication: Ppt and wife at bedside  Disposition Plan: To be determined   Consultants:   ID  Procedures:   None   Antimicrobials:   Vancomycin 12/14/2017 --> 01/17   Cefepime 12/14/2017 --> 01/17   Cefazolin 01/16 -->  Subjective: Pt reports feeling better.   Objective: Vitals:   12/15/17 1632 12/15/17 2034 12/16/17 0505 12/16/17 1430  BP: 137/69 135/78 128/68 130/71  Pulse: 78 85 89 78  Resp: '20 20 17 18  '$ Temp: 98.2 F (36.8 C) 97.6 F (36.4 C) 99 F (37.2 C) 98.5 F (36.9 C)  TempSrc: Oral Axillary Oral Oral  SpO2:  91% 92% 96%  Weight: 84.4 kg (186 lb)     Height: '5\' 10"'$  (1.778 m)       Intake/Output Summary (Last 24 hours) at 12/16/2017 1728 Last data filed at 12/16/2017 1656 Gross per 24 hour  Intake 1590 ml  Output 500 ml  Net 1090 ml   Filed Weights   12/15/17 1632  Weight: 84.4 kg (186 lb)    Physical Exam  Constitutional: Appears calm, NAD CVS: RRR, S1/S2 +, no murmurs, no gallops, no carotid bruit.  Pulmonary: Effort and breath sounds normal, rhonchi at bases  Abdominal: Soft. BS +,  no distension, tenderness, rebound or guarding.  Musculoskeletal: Normal range of motion. No edema and no tenderness.   Data Reviewed: I have personally reviewed following labs and imaging studies  CBC: Recent Labs  Lab 12/14/17 1540  12/16/17 0646  WBC 24.1* 14.8*  NEUTROABS 20.0*  --   HGB 12.9* 10.9*  HCT 39.6 35.1*  MCV 104.2* 104.2*  PLT 103* 72*   Basic Metabolic Panel: Recent Labs  Lab 12/14/17 1540 12/15/17 0721 12/16/17 0646  NA 133*  --  139  K 4.0  --  4.1  CL 100*  --  108  CO2 23  --  25  GLUCOSE 142*  --  105*  BUN 27*  --  17  CREATININE 1.17 1.04 1.02  CALCIUM 8.0*  --  7.8*   Liver Function Tests: Recent Labs  Lab 12/14/17 1540 12/16/17 0646  AST 87* 73*  ALT 81* 72*  ALKPHOS 118 102  BILITOT 0.5 0.4  PROT 6.9 5.8*  ALBUMIN 2.5* 2.0*   Cardiac Enzymes: Recent Labs  Lab 12/14/17 1540  TROPONINI <0.03    CBG: Recent Labs  Lab 12/14/17 1550  GLUCAP 141*   Urine analysis:    Component Value Date/Time   COLORURINE YELLOW 12/14/2017 1721   APPEARANCEUR CLEAR 12/14/2017 1721   LABSPEC 1.017 12/14/2017 1721   PHURINE 5.0 12/14/2017 1721   GLUCOSEU NEGATIVE 12/14/2017 1721   HGBUR NEGATIVE 12/14/2017 1721   BILIRUBINUR NEGATIVE 12/14/2017 1721   KETONESUR NEGATIVE 12/14/2017 1721   PROTEINUR 100 (A) 12/14/2017 1721   UROBILINOGEN 0.2 05/06/2015 0935   NITRITE NEGATIVE 12/14/2017 1721   LEUKOCYTESUR NEGATIVE 12/14/2017 1721   Radiology Studies: Dg Chest 2 View Result Date: 12/14/2017 Left basilar atelectasis.  No active cardiopulmonary disease.   Ct Angio Chest Pe W/cm &/or Wo Cm Result Date: 12/14/2017 1. Suboptimal exam limiting assessment for smaller pulmonary emboli. Allowing for this, there is no evidence of a pulmonary embolus. 2. Extensive left lower lobe consolidation consistent with pneumonia. 3. Findings of metastatic carcinoma noted on the prior chest, abdomen and pelvis CT are similar.   Scheduled Meds: . feeding supplement (ENSURE ENLIVE)  237 mL Oral BID BM  . [START ON 12/17/2017] rivaroxaban  20 mg Oral Q supper  . sucralfate  1 g Oral TID WC & HS   Continuous Infusions: .  ceFAZolin (ANCEF) IV Stopped (12/16/17 1346)     LOS: 2 days   Time spent: 25 minutes   Faye Ramsay, MD [facility] Pager [phone]  If 7PM-7AM, please contact night-coverage [email or web address] Password [facility] 12/16/2017, 5:28 PM

## 2017-12-17 ENCOUNTER — Encounter (HOSPITAL_COMMUNITY): Payer: Self-pay | Admitting: Diagnostic Radiology

## 2017-12-17 ENCOUNTER — Inpatient Hospital Stay (HOSPITAL_COMMUNITY): Payer: Medicare Other

## 2017-12-17 DIAGNOSIS — E440 Moderate protein-calorie malnutrition: Secondary | ICD-10-CM

## 2017-12-17 HISTORY — PX: IR REMOVAL TUN ACCESS W/ PORT W/O FL MOD SED: IMG2290

## 2017-12-17 LAB — CULTURE, RESPIRATORY: CULTURE: NORMAL

## 2017-12-17 LAB — BASIC METABOLIC PANEL
Anion gap: 5 (ref 5–15)
BUN: 15 mg/dL (ref 6–20)
CHLORIDE: 107 mmol/L (ref 101–111)
CO2: 26 mmol/L (ref 22–32)
Calcium: 7.9 mg/dL — ABNORMAL LOW (ref 8.9–10.3)
Creatinine, Ser: 0.9 mg/dL (ref 0.61–1.24)
GFR calc Af Amer: >60 mL/min (ref >60)
GFR calc non Af Amer: >60 mL/min (ref >60)
GLUCOSE: 102 mg/dL — AB (ref 65–99)
POTASSIUM: 3.9 mmol/L (ref 3.5–5.1)
Sodium: 138 mmol/L (ref 135–145)

## 2017-12-17 LAB — PROTIME-INR
INR: 1.22
Prothrombin Time: 15.3 seconds — ABNORMAL HIGH (ref 11.4–15.2)

## 2017-12-17 LAB — CULTURE, BLOOD (ROUTINE X 2): SPECIAL REQUESTS: ADEQUATE

## 2017-12-17 LAB — CBC WITH DIFFERENTIAL/PLATELET
BASOS PCT: 0 %
Basophils Absolute: 0 10*3/uL (ref 0.0–0.1)
Eosinophils Absolute: 0.1 10*3/uL (ref 0.0–0.7)
Eosinophils Relative: 1 %
HEMATOCRIT: 34.3 % — AB (ref 39.0–52.0)
Hemoglobin: 10.8 g/dL — ABNORMAL LOW (ref 13.0–17.0)
Lymphocytes Relative: 8 %
Lymphs Abs: 1 10*3/uL (ref 0.7–4.0)
MCH: 32.7 pg (ref 26.0–34.0)
MCHC: 31.5 g/dL (ref 30.0–36.0)
MCV: 103.9 fL — AB (ref 78.0–100.0)
MONOS PCT: 14 %
Monocytes Absolute: 1.7 10*3/uL — ABNORMAL HIGH (ref 0.1–1.0)
NEUTROS ABS: 9.8 10*3/uL — AB (ref 1.7–7.7)
Neutrophils Relative %: 77 %
Platelets: 72 10*3/uL — ABNORMAL LOW (ref 150–400)
RBC: 3.3 MIL/uL — ABNORMAL LOW (ref 4.22–5.81)
RDW: 16.8 % — AB (ref 11.5–15.5)
WBC: 12.6 10*3/uL — ABNORMAL HIGH (ref 4.0–10.5)

## 2017-12-17 LAB — CULTURE, RESPIRATORY W GRAM STAIN

## 2017-12-17 LAB — LEGIONELLA PNEUMOPHILA SEROGP 1 UR AG: L. pneumophila Serogp 1 Ur Ag: NEGATIVE

## 2017-12-17 MED ORDER — MIDAZOLAM HCL 2 MG/2ML IJ SOLN
INTRAMUSCULAR | Status: AC | PRN
  Administered 2017-12-17: 1 mg via INTRAVENOUS

## 2017-12-17 MED ORDER — LIDOCAINE-EPINEPHRINE (PF) 2 %-1:200000 IJ SOLN
INTRAMUSCULAR | Status: AC
  Filled 2017-12-17: qty 20

## 2017-12-17 MED ORDER — CEFAZOLIN SODIUM-DEXTROSE 2-4 GM/100ML-% IV SOLN
INTRAVENOUS | Status: AC
  Filled 2017-12-17: qty 100

## 2017-12-17 MED ORDER — MIDAZOLAM HCL 2 MG/2ML IJ SOLN
INTRAMUSCULAR | Status: AC
  Filled 2017-12-17: qty 2

## 2017-12-17 MED ORDER — FENTANYL CITRATE (PF) 100 MCG/2ML IJ SOLN
INTRAMUSCULAR | Status: AC | PRN
  Administered 2017-12-17: 50 ug via INTRAVENOUS

## 2017-12-17 MED ORDER — CHLOROPROCAINE HCL 1 % IJ SOLN
INTRAMUSCULAR | Status: AC
  Administered 2017-12-17: 15 mL
  Filled 2017-12-17: qty 30

## 2017-12-17 MED ORDER — FENTANYL CITRATE (PF) 100 MCG/2ML IJ SOLN
INTRAMUSCULAR | Status: AC
  Filled 2017-12-17: qty 2

## 2017-12-17 NOTE — Progress Notes (Signed)
Patient ID: Nicholas Hale, male   DOB: September 27, 1946, 72 y.o.   MRN: [id number]    PROGRESS NOTE  Nicholas Hale  LNL:[id number] DOB: 04/20/1946 DOA: 12/14/2017  PCP: [name], MD   Brief Narrative:  Patient 72 year old male with hypertension, hyperlipidemia, history of PE and DVT, lung cancer stage IV, malignant pericardial effusion, presented with several days duration of progressively worsening dyspnea worse with exertion, associated with productive cough of yellow sputum. CT findings consistent with left lower lobe pneumonia. Patient started on vancomycin and cefepime and TRH asked to admit for further evaluation  Assessment & Plan:   Principal Problem:   Sepsis secondary to left lower lobe pneumonia, MSSA bacteremia  - Please note that patient met criteria for sepsis with HR > 90, WBC 24, source left lower lobe pneumonia, MSSA bacteremia suspected from portacath - pt was started initially on vancomycin and cefepime, ID team has been consulted and ABX changed to Cefazolin 2gm IV Q8 hours - will need to rule out endocarditis, ECHO done, TEE will be scheduled for Monday  - IR consulted for removal of port-a-cath, this was removed today 01/18 - will hold off on placing new PICC line access until we document clearance of the bacteremia  - repeat blood cultures in AM     Mucositis - continue magic mouthwash   Active Problems:   Anemia, of malignancy - no evidence of active bleeding  - CBC in AM     Hyponatremia - Suspect prerenal etiology from poor oral intake in the setting of pneumonia - resolved     Stage IV lung cancer, with metastases - Follows with Dr. [name] - I have notified him of pt's admission     History of PE and DVT - continue Xarelto     Thrombocytopenia  - Likely chemotherapy-induced  - monitor     Transaminitis - Noticed slight bump in LFTs - continue to hold statin   DVT prophylaxis:  Xarelto  Code Status:  full code  Family Communication:  pt and wife at bedside  Disposition Plan: To be determined   Consultants:   ID  Procedures:   None   Antimicrobials:   Vancomycin 12/14/2017 --> 01/17   Cefepime 12/14/2017 --> 01/17   Cefazolin 01/16 -->  Subjective: Pt reports feeling better.   Objective: Vitals:   12/17/17 1535 12/17/17 1600 12/17/17 1605 12/17/17 1626  BP: (!) 101/55 138/82 (!) 145/91 (!) 145/78  Pulse: 85     Resp: 14     Temp:      TempSrc:      SpO2: 98%     Weight:      Height:        Intake/Output Summary (Last 24 hours) at 12/17/2017 1645 Last data filed at 12/17/2017 1100 Gross per 24 hour  Intake 440 ml  Output -  Net 440 ml   Filed Weights   12/15/17 1632  Weight: 84.4 kg (186 lb)    Physical Exam  Constitutional: Appears calm, NAD CVS: RRR, S1/S2 +, no murmurs, no gallops, no carotid bruit.  Pulmonary: Effort and breath sounds normal, rhonchi at bases   Abdominal: Soft. BS +,  no distension, tenderness, rebound or guarding.  Neuro: Alert. Normal reflexes, muscle tone coordination. No cranial nerve deficit   Data Reviewed: I have personally reviewed following labs and imaging studies  CBC: Recent Labs  Lab 12/14/17 1540 12/16/17 0646 12/17/17 0615  WBC 24.1* 14.8* 12.6*  NEUTROABS 20.0*  --  9.8*  HGB 12.9* 10.9* 10.8*  HCT 39.6 35.1* 34.3*  MCV 104.2* 104.2* 103.9*  PLT 103* 72* 72*   Basic Metabolic Panel: Recent Labs  Lab 12/14/17 1540 12/15/17 0721 12/16/17 0646 12/17/17 0615  NA 133*  --  139 138  K 4.0  --  4.1 3.9  CL 100*  --  108 107  CO2 23  --  25 26  GLUCOSE 142*  --  105* 102*  BUN 27*  --  17 15  CREATININE 1.17 1.04 1.02 0.90  CALCIUM 8.0*  --  7.8* 7.9*   Liver Function Tests: Recent Labs  Lab 12/14/17 1540 12/16/17 0646  AST 87* 73*  ALT 81* 72*  ALKPHOS 118 102  BILITOT 0.5 0.4  PROT 6.9 5.8*  ALBUMIN 2.5* 2.0*   Cardiac Enzymes: Recent Labs  Lab 12/14/17 1540  TROPONINI <0.03   CBG: Recent Labs  Lab 12/14/17 1550    GLUCAP 141*   Urine analysis:    Component Value Date/Time   COLORURINE YELLOW 12/14/2017 1721   APPEARANCEUR CLEAR 12/14/2017 1721   LABSPEC 1.017 12/14/2017 1721   PHURINE 5.0 12/14/2017 1721   GLUCOSEU NEGATIVE 12/14/2017 1721   HGBUR NEGATIVE 12/14/2017 1721   BILIRUBINUR NEGATIVE 12/14/2017 1721   KETONESUR NEGATIVE 12/14/2017 1721   PROTEINUR 100 (A) 12/14/2017 1721   UROBILINOGEN 0.2 05/06/2015 0935   NITRITE NEGATIVE 12/14/2017 1721   LEUKOCYTESUR NEGATIVE 12/14/2017 1721   Radiology Studies: Dg Chest 2 View Result Date: 12/14/2017 Left basilar atelectasis.  No active cardiopulmonary disease.   Ct Angio Chest Pe W/cm &/or Wo Cm Result Date: 12/14/2017 1. Suboptimal exam limiting assessment for smaller pulmonary emboli. Allowing for this, there is no evidence of a pulmonary embolus. 2. Extensive left lower lobe consolidation consistent with pneumonia. 3. Findings of metastatic carcinoma noted on the prior chest, abdomen and pelvis CT are similar.   Scheduled Meds: . feeding supplement (ENSURE ENLIVE)  237 mL Oral BID BM  . lidocaine-EPINEPHrine      . rivaroxaban  20 mg Oral Q supper  . sucralfate  1 g Oral TID WC & HS   Continuous Infusions: .  ceFAZolin (ANCEF) IV Stopped (12/17/17 1528)     LOS: 3 days   Time spent: 25 minutes   Faye Ramsay, MD [facility] Pager [phone]  If 7PM-7AM, please contact night-coverage [email or web address] Password [facility] 12/17/2017, 4:45 PM

## 2017-12-17 NOTE — Sedation Documentation (Signed)
Patient is resting comfortably. 

## 2017-12-17 NOTE — Discharge Instructions (Signed)
Implanted Port Removal, Care After °Refer to this sheet in the next few weeks. These instructions provide you with information about caring for yourself after your procedure. Your health care provider may also give you more specific instructions. Your treatment has been planned according to current medical practices, but problems sometimes occur. Call your health care provider if you have any problems or questions after your procedure. °What can I expect after the procedure? °After the procedure, it is common to have: °· Soreness or pain near your incision. °· Some swelling or bruising near your incision. ° °Follow these instructions at home: °Medicines °· Take over-the-counter and prescription medicines only as told by your health care provider. °· If you were prescribed an antibiotic medicine, take it as told by your health care provider. Do not stop taking the antibiotic even if you start to feel better. °Bathing °· Do not take baths, swim, or use a hot tub until your health care provider approves. Ask your health care provider if you can take showers. You may only be allowed to take sponge baths for bathing. °Incision care °· Follow instructions from your health care provider about how to take care of your incision. Make sure you: °? Wash your hands with soap and water before you change your bandage (dressing). If soap and water are not available, use hand sanitizer. °? Change your dressing as told by your health care provider. °? Keep your dressing dry. °? Leave stitches (sutures), skin glue, or adhesive strips in place. These skin closures may need to stay in place for 2 weeks or longer. If adhesive strip edges start to loosen and curl up, you may trim the loose edges. Do not remove adhesive strips completely unless your health care provider tells you to do that. °· Check your incision area every day for signs of infection. Check for: °? More redness, swelling, or pain. °? More fluid or  blood. °? Warmth. °? Pus or a bad smell. °Driving °· If you received a sedative, do not drive for 24 hours after the procedure. °· If you did not receive a sedative, ask your health care provider when it is safe to drive. °Activity °· Return to your normal activities as told by your health care provider. Ask your health care provider what activities are safe for you. °· Until your health care provider says it is safe: °? Do not lift anything that is heavier than 10 lb (4.5 kg). °? Do not do activities that involve lifting your arms over your head. °General instructions °· Do not use any tobacco products, such as cigarettes, chewing tobacco, and e-cigarettes. Tobacco can delay healing. If you need help quitting, ask your health care provider. °· Keep all follow-up visits as told by your health care provider. This is important. °Contact a health care provider if: °· You have more redness, swelling, or pain around your incision. °· You have more fluid or blood coming from your incision. °· Your incision feels warm to the touch. °· You have pus or a bad smell coming from your incision. °· You have a fever. °· You have pain that is not relieved by your pain medicine. °Get help right away if: °· You have chest pain. °· You have difficulty breathing. °This information is not intended to replace advice given to you by your health care provider. Make sure you discuss any questions you have with your health care provider. °Document Released: 10/28/2015 Document Revised: 04/23/2016 Document Reviewed: 08/21/2015 °Elsevier Interactive Patient   Education © 2018 [employer] ° °

## 2017-12-17 NOTE — Care Management Important Message (Signed)
Important Message  Patient Details  Name: Nicholas Hale MRN: [id number] Date of Birth: 1946/09/20   Medicare Important Message Given:  Yes    Kerin Salen 12/17/2017, 10:24 AMImportant Message  Patient Details  Name: Nicholas Hale MRN: [id number] Date of Birth: 02-02-1946   Medicare Important Message Given:  Yes    Kerin Salen 12/17/2017, 10:24 AM

## 2017-12-17 NOTE — Progress Notes (Signed)
    [facility]    Date of Admission:  12/14/2017   Total days of antibiotics 3 cefazolin           ID: Nicholas Hale is a 72 y.o. male with  NSCLCa on chemo admitted for flulike illness and cough found to have MSSA bacteremia Principal Problem:   Pneumonia Active Problems:   Leukocytosis   Anemia   Hyponatremia    Subjective: Afebrile, port removed today without difficulty. Feeling better  Medications:  . feeding supplement (ENSURE ENLIVE)  237 mL Oral BID BM  . lidocaine-EPINEPHrine      . rivaroxaban  20 mg Oral Q supper  . sucralfate  1 g Oral TID WC & HS    Objective: Vital signs in last 24 hours: Temp:  [98.4 F (36.9 C)-98.6 F (37 C)] 98.6 F (37 C) (01/18 0437) Pulse Rate:  [71-91] 85 (01/18 1535) Resp:  [11-20] 14 (01/18 1535) BP: (101-137)/(53-72) 101/55 (01/18 1535) SpO2:  [92 %-100 %] 98 % (01/18 1535)  Physical Exam  Constitutional: He is oriented to person, place, and time. He appears well-developed and well-nourished. No distress.  HENT:  Mouth/Throat: Oropharynx is clear and moist. No oropharyngeal exudate.  Cardiovascular: Normal rate, regular rhythm and normal heart sounds. Exam reveals no gallop and no friction rub.  No murmur heard.  Chest wall = bandage over right upper chest from port extraction Pulmonary/Chest: Effort normal and breath sounds normal. No respiratory distress. He has no wheezes.  Abdominal: Soft. Bowel sounds are normal. He exhibits no distension. There is no tenderness.  Lymphadenopathy:  He has no cervical adenopathy.  Neurological: He is alert and oriented to person, place, and time.  Skin: Skin is warm and dry. No rash noted. No erythema.  Psychiatric: He has a normal mood and affect. His behavior is normal.    Lab Results Recent Labs    12/16/17 0646 12/17/17 0615  WBC 14.8* 12.6*  HGB 10.9* 10.8*  HCT 35.1* 34.3*  NA 139 138  K 4.1 3.9  CL 108 107  CO2 25 26  BUN 17 15    CREATININE 1.02 0.90   Liver Panel Recent Labs    12/16/17 0646  PROT 5.8*  ALBUMIN 2.0*  AST 73*  ALT 72*  ALKPHOS 102  BILITOT 0.4    Microbiology: reviewed Studies/Results: No results found.   Assessment/Plan: Continue on cefazolin 2gm IV Q 8hr  we will repeat blood cx today to see that he is clearing his bacteremia Still recommend to get TEE  So that we can safely give him on 2 wk of IV abtx. Otherwise need to commit him to 4 wk. Would rather get TEE so that we don't have to postpone his chemo too much If today's blood cx remain NGTD at 48hrs, he is ok to get picc line on Sunday/monday.  Last day of 2 wk of abtx would be feb 2nd if TEE is negative  [facility] Cell: [phone] Pager: [phone]  12/17/2017, 3:41 PM

## 2017-12-17 NOTE — Procedures (Signed)
Right chest port was removed without complication.  The port pocket was clean and healthy looking.  Pocket and skin were closed.  Minimal blood loss.

## 2017-12-18 ENCOUNTER — Ambulatory Visit: Payer: Medicare Other

## 2017-12-18 LAB — BASIC METABOLIC PANEL
ANION GAP: 5 (ref 5–15)
BUN: 14 mg/dL (ref 6–20)
CHLORIDE: 104 mmol/L (ref 101–111)
CO2: 27 mmol/L (ref 22–32)
CREATININE: 0.91 mg/dL (ref 0.61–1.24)
Calcium: 8 mg/dL — ABNORMAL LOW (ref 8.9–10.3)
GFR calc non Af Amer: >60 mL/min (ref >60)
Glucose, Bld: 108 mg/dL — ABNORMAL HIGH (ref 65–99)
POTASSIUM: 4.1 mmol/L (ref 3.5–5.1)
SODIUM: 136 mmol/L (ref 135–145)

## 2017-12-18 LAB — CBC
HEMATOCRIT: 35.6 % — AB (ref 39.0–52.0)
HEMOGLOBIN: 11 g/dL — AB (ref 13.0–17.0)
MCH: 32.3 pg (ref 26.0–34.0)
MCHC: 30.9 g/dL (ref 30.0–36.0)
MCV: 104.4 fL — ABNORMAL HIGH (ref 78.0–100.0)
Platelets: 89 10*3/uL — ABNORMAL LOW (ref 150–400)
RBC: 3.41 MIL/uL — AB (ref 4.22–5.81)
RDW: 16.8 % — ABNORMAL HIGH (ref 11.5–15.5)
WBC: 11.4 10*3/uL — AB (ref 4.0–10.5)

## 2017-12-18 NOTE — Progress Notes (Signed)
Patient ID: Nicholas Hale, male   DOB: 1946/04/22, 72 y.o.   MRN: [id number]    PROGRESS NOTE  AADIT HAGOOD  LEX:[id number] DOB: February 21, 1946 DOA: 12/14/2017  PCP: [name], MD   Brief Narrative:  Patient 72 year old male with hypertension, hyperlipidemia, history of PE and DVT, lung cancer stage IV, malignant pericardial effusion, presented with several days duration of progressively worsening dyspnea worse with exertion, associated with productive cough of yellow sputum. CT findings consistent with left lower lobe pneumonia. Patient started on vancomycin and cefepime and TRH asked to admit for further evaluation  Assessment & Plan:   Principal Problem:   Sepsis secondary to left lower lobe pneumonia, MSSA bacteremia  - Please note that patient met criteria for sepsis with HR > 90, WBC 24, source left lower lobe pneumonia, MSSA bacteremia suspected from portacath - pt was started initially on vancomycin and cefepime, ID team has been consulted and ABX changed to Cefazolin 2gm IV Q8 hours - will need to rule out endocarditis, ECHO done - plan for TEE on monday - port-a-cath removed 1/18 - will hold off on placing new PICC line access until we document clearance of the bacteremia  - blood cx repeated 1/17     Mucositis - continue magic mouthwash   Active Problems:   Anemia, of malignancy - no evidence of active bleeding  - hgb stable at 11     Hyponatremia - Suspect prerenal etiology from poor oral intake in the setting of pneumonia - sodium wnl this am     Stage IV lung cancer, with metastases - Follows with Dr. [name]    History of PE and DVT - continue xarelto     Thrombocytopenia  - Likely chemotherapy-induced  - platelets 89 this am    Transaminitis - Noticed slight bump in LFTs - holding statin therapy   DVT prophylaxis:  Xarelto  Code Status:  full code  Family Communication: wife at bedside  Disposition Plan: tee on Monday   Consultants:    ID  Procedures:   None   Antimicrobials:   Vancomycin 12/14/2017 --> 01/17   Cefepime 12/14/2017 --> 01/17   Cefazolin 01/16 -->  Subjective: No overnight events.  Objective: Vitals:   12/17/17 1626 12/17/17 1650 12/17/17 2117 12/18/17 0525  BP: (!) 145/78 (!) 147/68 (!) 143/83 136/73  Pulse:   85 90  Resp:   16 16  Temp:   (!) 97.4 F (36.3 C) 98.5 F (36.9 C)  TempSrc:   Oral Oral  SpO2:   95% 94%  Weight:      Height:        Intake/Output Summary (Last 24 hours) at 12/18/2017 1322 Last data filed at 12/18/2017 0801 Gross per 24 hour  Intake 580 ml  Output -  Net 580 ml   Filed Weights   12/15/17 1632  Weight: 84.4 kg (186 lb)    Physical Exam  Constitutional: Appears well-developed and well-nourished. No distress.  CVS: RRR, S1/S2  Pulmonary: diminished breath sounds but no wheezing  Abdominal: Soft. BS +,  no distension, tenderness, rebound or guarding.  Musculoskeletal: Normal range of motion. No edema and no tenderness.  Lymphadenopathy: No lymphadenopathy noted, cervical, inguinal. Neuro: Alert. Normal reflexes, muscle tone coordination. No cranial nerve deficit. Skin: Skin is warm and dry.  Psychiatric: Normal mood and affect. Behavior, judgment, thought content normal.   Data Reviewed: I have personally reviewed following labs and imaging studies  CBC: Recent Labs  Lab 12/14/17  1540 12/16/17 0646 12/17/17 0615 12/18/17 0707  WBC 24.1* 14.8* 12.6* 11.4*  NEUTROABS 20.0*  --  9.8*  --   HGB 12.9* 10.9* 10.8* 11.0*  HCT 39.6 35.1* 34.3* 35.6*  MCV 104.2* 104.2* 103.9* 104.4*  PLT 103* 72* 72* 89*   Basic Metabolic Panel: Recent Labs  Lab 12/14/17 1540 12/15/17 0721 12/16/17 0646 12/17/17 0615 12/18/17 0707  NA 133*  --  139 138 136  K 4.0  --  4.1 3.9 4.1  CL 100*  --  108 107 104  CO2 23  --  _0 GLUCOSE 142*  --  105* 102* 108*  BUN 27*  --  _1 CREATININE 1.17 1.04 1.02 0.90 0.91  CALCIUM 8.0*  --  7.8* 7.9*  8.0*   Liver Function Tests: Recent Labs  Lab 12/14/17 1540 12/16/17 0646  AST 87* 73*  ALT 81* 72*  ALKPHOS 118 102  BILITOT 0.5 0.4  PROT 6.9 5.8*  ALBUMIN 2.5* 2.0*   Cardiac Enzymes: Recent Labs  Lab 12/14/17 1540  TROPONINI <0.03   CBG: Recent Labs  Lab 12/14/17 1550  GLUCAP 141*   Urine analysis:    Component Value Date/Time   COLORURINE YELLOW 12/14/2017 [place] 12/14/2017 1721   LABSPEC 1.017 12/14/2017 1721   PHURINE 5.0 12/14/2017 1721   GLUCOSEU NEGATIVE 12/14/2017 1721   HGBUR NEGATIVE 12/14/2017 1721   BILIRUBINUR NEGATIVE 12/14/2017 1721   KETONESUR NEGATIVE 12/14/2017 1721   PROTEINUR 100 (A) 12/14/2017 1721   UROBILINOGEN 0.2 05/06/2015 0935   NITRITE NEGATIVE 12/14/2017 1721   LEUKOCYTESUR NEGATIVE 12/14/2017 1721   Radiology Studies: Dg Chest 2 View Result Date: 12/14/2017 Left basilar atelectasis.  No active cardiopulmonary disease.   Ct Angio Chest Pe W/cm &/or Wo Cm Result Date: 12/14/2017 1. Suboptimal exam limiting assessment for smaller pulmonary emboli. Allowing for this, there is no evidence of a pulmonary embolus. 2. Extensive left lower lobe consolidation consistent with pneumonia. 3. Findings of metastatic carcinoma noted on the prior chest, abdomen and pelvis CT are similar.   Scheduled Meds: . feeding supplement (ENSURE ENLIVE)  237 mL Oral BID BM  . rivaroxaban  20 mg Oral Q supper  . sucralfate  1 g Oral TID WC & HS   Continuous Infusions: .  ceFAZolin (ANCEF) IV Stopped (12/18/17 0530)     LOS: 4 days   Time spent: 25 minutes   Leisa Lenz, MD [facility] Pager [phone]  If 7PM-7AM, please contact night-coverage [email or web address] Password [email or web address] 12/18/2017, 1:22 PM

## 2017-12-19 LAB — CBC
HEMATOCRIT: 35.3 % — AB (ref 39.0–52.0)
HEMOGLOBIN: 10.9 g/dL — AB (ref 13.0–17.0)
MCH: 32.2 pg (ref 26.0–34.0)
MCHC: 30.9 g/dL (ref 30.0–36.0)
MCV: 104.1 fL — ABNORMAL HIGH (ref 78.0–100.0)
Platelets: 93 10*3/uL — ABNORMAL LOW (ref 150–400)
RBC: 3.39 MIL/uL — ABNORMAL LOW (ref 4.22–5.81)
RDW: 16.7 % — ABNORMAL HIGH (ref 11.5–15.5)
WBC: 11.9 10*3/uL — ABNORMAL HIGH (ref 4.0–10.5)

## 2017-12-19 LAB — BASIC METABOLIC PANEL
Anion gap: 6 (ref 5–15)
BUN: 13 mg/dL (ref 6–20)
CHLORIDE: 104 mmol/L (ref 101–111)
CO2: 28 mmol/L (ref 22–32)
CREATININE: 0.86 mg/dL (ref 0.61–1.24)
Calcium: 8.2 mg/dL — ABNORMAL LOW (ref 8.9–10.3)
GFR calc non Af Amer: >60 mL/min (ref >60)
Glucose, Bld: 115 mg/dL — ABNORMAL HIGH (ref 65–99)
Potassium: 3.7 mmol/L (ref 3.5–5.1)
Sodium: 138 mmol/L (ref 135–145)

## 2017-12-19 MED ORDER — OXYMETAZOLINE HCL 0.05 % NA SOLN
2.0000 | Freq: Four times a day (QID) | NASAL | Status: AC
  Administered 2017-12-19 – 2017-12-21 (×10): 2 via NASAL

## 2017-12-19 MED ORDER — OXYMETAZOLINE HCL 0.05 % NA SOLN
1.0000 | Freq: Two times a day (BID) | NASAL | Status: DC
  Administered 2017-12-19: 1 via NASAL

## 2017-12-19 MED ORDER — SALINE SPRAY 0.65 % NA SOLN
2.0000 | NASAL | Status: DC
  Administered 2017-12-19 – 2017-12-24 (×38): 2 via NASAL
  Filled 2017-12-19: qty 44

## 2017-12-19 MED ORDER — OXYMETAZOLINE HCL 0.05 % NA SOLN: 1.0000 | Freq: Two times a day (BID) | NASAL | Status: DC

## 2017-12-19 MED ORDER — ZOLPIDEM TARTRATE 5 MG PO TABS: 5.0000 mg | ORAL_TABLET | Freq: Every evening | ORAL | Status: DC | PRN

## 2017-12-19 MED ORDER — OXYMETAZOLINE HCL 0.05 % NA SOLN
2.0000 | Freq: Two times a day (BID) | NASAL | Status: DC
  Filled 2017-12-19: qty 15

## 2017-12-19 NOTE — Progress Notes (Signed)
Patient stated he has not been sleeping well at night and is requesting sleep medication.  Dr. Charlies Silvers notified via text page.

## 2017-12-19 NOTE — Progress Notes (Signed)
Patient bleeding from left nare.  Patient complain of weakness. Wife concerned.  BP 116/71, HR 93, R 28, Temp 97.4, 97% on room air.  Dr. Charlies Silvers notified via text page.

## 2017-12-19 NOTE — Progress Notes (Addendum)
Patient ID: Nicholas Hale, male   DOB: [birth year], 72 y.o.   MRN: [id number]    PROGRESS NOTE  Nicholas Hale  RCB:[id number] DOB: 10-04-1946 DOA: 12/14/2017  PCP: [name], MD   Brief Narrative:  72 year old male with hypertension, hyperlipidemia, history of PE and DVT, lung cancer stage IV, malignant pericardial effusion, presented with several days duration of progressively worsening dyspnea worse with exertion, associated with productive cough of yellow sputum. CT findings consistent with left lower lobe pneumonia. Patient started on vancomycin and cefepime and TRH asked to admit for further evaluation.  This am, pt started to bleed from his left nose. Xarelto held. ENT consulted.  Assessment & Plan:   Principal Problem:   Sepsis secondary to left lower lobe pneumonia, MSSA bacteremia  - Please note that patient met criteria for sepsis with HR > 90, WBC 24, source left lower lobe pneumonia, MSSA bacteremia suspected from portacath - pt was started initially on vancomycin and cefepime, ID team has been consulted and ABX changed to Cefazolin 2gm IV Q8 hours - PAC removed 1/18 - TEE planned for tomorrow  - Blood cx repeated and so far showed no growth    Active Problems:   Mucositis - continue magic mouthwash  - stable    Epistaxis - Hold xarelto - Pt with blood clots and heavy bleeding this am - Start afrin spray - ENT consulted     Anemia of chronic disease  - hgb down to 8.2 - xarelto stopped      Hyponatremia - Suspect prerenal etiology from poor oral intake in the setting of pneumonia - sodium 138    Stage IV lung cancer, with metastases - Follows with Dr. [name]    History of PE and DVT - stop xarelto due to epistaxis    Thrombocytopenia  - Likely chemotherapy-induced  - platelets 93 this am    Transaminitis - Noticed slight bump in LFTs - holding statin therapy   DVT prophylaxis:  Stop xarelto due to bleed  Code Status:  full code  Family  Communication: wife at bedside  Disposition Plan: tee Monday   Consultants:   ID  Procedures:   None   Antimicrobials:   Vancomycin 12/14/2017 --> 01/17   Cefepime 12/14/2017 --> 01/17   Cefazolin 01/16 -->  Subjective: Bleeding from left nose this am.  Objective: Vitals:   12/18/17 2248 12/19/17 0449 12/19/17 0745 12/19/17 0900  BP: (!) 146/75 131/71 116/77   Pulse: 92 82 93   Resp: 16 16 (!) 28   Temp: 97.9 F (36.6 C) 98.7 F (37.1 C) (!) 97.4 F (36.3 C)   TempSrc: Oral Oral Oral   SpO2: 93% 92% 97% 96%  Weight:      Height:        Intake/Output Summary (Last 24 hours) at 12/19/2017 1117 Last data filed at 12/18/2017 1800 Gross per 24 hour  Intake 460 ml  Output -  Net 460 ml   Filed Weights   12/15/17 1632  Weight: 84.4 kg (186 lb)    Physical Exam  Constitutional: Appears well-developed and well-nourished. No distress.   CVS: RRR, S1/S2 + Pulmonary: no rhonchi, no stridor, (+) mild wheezing  Abdominal: Soft. BS +,  no distension, tenderness, rebound or guarding.  Musculoskeletal: Normal range of motion. No edema and no tenderness.  Lymphadenopathy: No lymphadenopathy noted, cervical, inguinal. Neuro: Alert. Normal reflexes, muscle tone coordination. No cranial nerve deficit. Skin: Skin is warm and dry. No rash noted.  Not diaphoretic. No erythema. No pallor.  Psychiatric: Normal mood and affect. Behavior, judgment, thought content normal.   Data Reviewed: I have personally reviewed following labs and imaging studies  CBC: Recent Labs  Lab 12/14/17 1540 12/16/17 0646 12/17/17 0615 12/18/17 0707 12/19/17 0553  WBC 24.1* 14.8* 12.6* 11.4* 11.9*  NEUTROABS 20.0*  --  9.8*  --   --   HGB 12.9* 10.9* 10.8* 11.0* 10.9*  HCT 39.6 35.1* 34.3* 35.6* 35.3*  MCV 104.2* 104.2* 103.9* 104.4* 104.1*  PLT 103* 72* 72* 89* 93*   Basic Metabolic Panel: Recent Labs  Lab 12/14/17 1540 12/15/17 0721 12/16/17 0646 12/17/17 0615 12/18/17 0707  12/19/17 0553  NA 133*  --  139 138 136 138  K 4.0  --  4.1 3.9 4.1 3.7  CL 100*  --  108 107 104 104  CO2 23  --  '25 26 27 28  '$ GLUCOSE 142*  --  105* 102* 108* 115*  BUN 27*  --  '17 15 14 13  '$ CREATININE 1.17 1.04 1.02 0.90 0.91 0.86  CALCIUM 8.0*  --  7.8* 7.9* 8.0* 8.2*   Liver Function Tests: Recent Labs  Lab 12/14/17 1540 12/16/17 0646  AST 87* 73*  ALT 81* 72*  ALKPHOS 118 102  BILITOT 0.5 0.4  PROT 6.9 5.8*  ALBUMIN 2.5* 2.0*   Cardiac Enzymes: Recent Labs  Lab 12/14/17 1540  TROPONINI <0.03   CBG: Recent Labs  Lab 12/14/17 1550  GLUCAP 141*   Urine analysis:    Component Value Date/Time   COLORURINE YELLOW 12/14/2017 1721   APPEARANCEUR CLEAR 12/14/2017 1721   LABSPEC 1.017 12/14/2017 1721   PHURINE 5.0 12/14/2017 1721   GLUCOSEU NEGATIVE 12/14/2017 1721   HGBUR NEGATIVE 12/14/2017 1721   BILIRUBINUR NEGATIVE 12/14/2017 1721   KETONESUR NEGATIVE 12/14/2017 1721   PROTEINUR 100 (A) 12/14/2017 1721   UROBILINOGEN 0.2 05/06/2015 0935   NITRITE NEGATIVE 12/14/2017 1721   LEUKOCYTESUR NEGATIVE 12/14/2017 1721   Radiology Studies: Dg Chest 2 View Result Date: 12/14/2017 Left basilar atelectasis.  No active cardiopulmonary disease.   Ct Angio Chest Pe W/cm &/or Wo Cm Result Date: 12/14/2017 1. Suboptimal exam limiting assessment for smaller pulmonary emboli. Allowing for this, there is no evidence of a pulmonary embolus. 2. Extensive left lower lobe consolidation consistent with pneumonia. 3. Findings of metastatic carcinoma noted on the prior chest, abdomen and pelvis CT are similar.   Scheduled Meds: . feeding supplement (ENSURE ENLIVE)  237 mL Oral BID BM  . oxymetazoline  2 spray Each Nare QID  . sodium chloride  2 spray Each Nare Q2H while awake  . sucralfate  1 g Oral TID WC & HS   Continuous Infusions: .  ceFAZolin (ANCEF) IV 2 g (12/19/17 0525)     LOS: 5 days   Time spent: 25 minutes   Leisa Lenz, MD [facility] Pager  [phone]  If 7PM-7AM, please contact night-coverage [email or web address] Password [email or web address] 12/19/2017, 11:17 AM

## 2017-12-19 NOTE — Progress Notes (Signed)
This shift pt began to have nosebleed from L nare. Pressure and gauze applied. After 40 minutes large clot removed. Bleeding occurrence began again and hospitalist notified. New orders given. Pt refused  Afrin. Pressure and gauze reapplied. Will continue to monitor

## 2017-12-19 NOTE — Consult Note (Signed)
Reason for Consult: Nosebleed Referring Physician:  Hospitalists  Nicholas Hale is an 72 y.o. male.  HPI: 72 year old male admitted 1/15 due to pneumonia with history of stage IV lung cancer and associated pulmonary embolism and pericardial effusion.  He has a past episode of oral bleeding that would not stop that was found to be due to low platelet count.  This morning, he started bleeding from the left side of the nose and bleeding would not stop with some form of packing.  I recommended use of Afrin by phone and bleeding has stopped by the time of my examination.  He takes Xarelto and this was held this morning.  Past Medical History:  Diagnosis Date  . Abdominal aortic aneurysm ([place])   . Arthritis    "hips; lower spine" (05/24/2014)  . Chemotherapy induced neutropenia ([place]) 03/31/2017  . Chronic fatigue 10/05/2016  . Encounter for antineoplastic chemotherapy 02/17/2017  . History of radiation therapy 10/05/17-10/25/17   left lung/chest 35 Gy in 14 fractions  . Hyperlipidemia   . Hypertension   . Lung cancer ([place]) 09/25/2016  . PE (pulmonary embolism) 9/15  . Pericardial effusion 09/20/2016   Malignant effusion s/p pericardial drain  . Personal history of DVT (deep vein thrombosis) 9/15    Past Surgical History:  Procedure Laterality Date  . ANTERIOR FUSION CERVICAL SPINE  ~ 2005  . APPENDECTOMY  1978  . CARDIAC CATHETERIZATION N/A 09/21/2016   Procedure: Pericardiocentesis;  Surgeon: Jettie Booze, MD;  Location: [facility];  Service: Cardiovascular;  Laterality: N/A;  . COLONOSCOPY    . HEMIARTHROPLASTY SHOULDER FRACTURE Left 12/2008  . INGUINAL HERNIA REPAIR Bilateral 1992  . IR GENERIC HISTORICAL  02/01/2017   IR FLUORO GUIDE PORT INSERTION RIGHT 02/01/2017 Arne Cleveland, MD [facility]  . IR GENERIC HISTORICAL  02/01/2017   IR US GUIDE VASC ACCESS RIGHT 02/01/2017 Arne Cleveland, MD [facility]  . IR REMOVAL TUN ACCESS W/ PORT W/O FL MOD SED  12/17/2017  . JOINT  REPLACEMENT    . PERICARDIAL FLUID DRAINAGE  09/21/2016  . TOTAL HIP ARTHROPLASTY Left 05/23/2014   Procedure: TOTAL HIP ARTHROPLASTY;  Surgeon: Kerin Salen, MD;  Location: [facility];  Service: Orthopedics;  Laterality: Left;  . TOTAL HIP ARTHROPLASTY Right 05/15/2015   Procedure: TOTAL HIP ARTHROPLASTY;  Surgeon: Frederik Pear, MD;  Location: [facility];  Service: Orthopedics;  Laterality: Right;    Family History  Problem Relation Age of Onset  . Clotting disorder Mother   . Diabetes Mellitus I Mother   . Prostate cancer Father     Social History:  reports that he quit smoking about 9 years ago. His smoking use included cigarettes. He has a 20.00 pack-year smoking history. he has never used smokeless tobacco. He reports that he does not drink alcohol or use drugs.  Allergies:  Allergies  Allergen Reactions  . Lidocaine Swelling    "Hard white blisters" formed on tongue, swollen mouth    Medications: I have reviewed the patient's current medications.  Results for orders placed or performed during the hospital encounter of 12/14/17 (from the past 48 hour(s))  Culture, blood (routine x 2)     Status: None (Preliminary result)   Collection Time: 12/17/17  4:10 PM  Result Value Ref Range   Specimen Description BLOOD RIGHT HAND    Special Requests      BOTTLES DRAWN AEROBIC AND ANAEROBIC Blood Culture adequate volume   Culture  NO GROWTH < 24 HOURS Performed at [facility] [address] [place], [place] [address]    Report Status PENDING   Culture, blood (routine x 2)     Status: None (Preliminary result)   Collection Time: 12/17/17  4:20 PM  Result Value Ref Range   Specimen Description BLOOD RIGHT ANTECUBITAL    Special Requests      BOTTLES DRAWN AEROBIC AND ANAEROBIC Blood Culture adequate volume   Culture      NO GROWTH < 24 HOURS Performed at [facility], [place] [address] [place], [place] [address]    Report Status PENDING   CBC     Status: Abnormal    Collection Time: 12/18/17  7:07 AM  Result Value Ref Range   WBC 11.4 (H) 4.0 - 10.5 K/uL   RBC 3.41 (L) 4.22 - 5.81 MIL/uL   Hemoglobin 11.0 (L) 13.0 - 17.0 g/dL   HCT 35.6 (L) 39.0 - 52.0 %   MCV 104.4 (H) 78.0 - 100.0 fL   MCH 32.3 26.0 - 34.0 pg   MCHC 30.9 30.0 - 36.0 g/dL   RDW 16.8 (H) 11.5 - 15.5 %   Platelets 89 (L) 150 - 400 K/uL    Comment: RESULT REPEATED AND VERIFIED SPECIMEN CHECKED FOR CLOTS CONSISTENT WITH PREVIOUS RESULT   Basic metabolic panel     Status: Abnormal   Collection Time: 12/18/17  7:07 AM  Result Value Ref Range   Sodium 136 135 - 145 mmol/L   Potassium 4.1 3.5 - 5.1 mmol/L   Chloride 104 101 - 111 mmol/L   CO2 27 22 - 32 mmol/L   Glucose, Bld 108 (H) 65 - 99 mg/dL   BUN 14 6 - 20 mg/dL   Creatinine, Ser 0.91 0.61 - 1.24 mg/dL   Calcium 8.0 (L) 8.9 - 10.3 mg/dL   GFR calc non Af Amer >60 >60 mL/min   GFR calc Af Amer >60 >60 mL/min    Comment: (NOTE) The eGFR has been calculated using the CKD EPI equation. This calculation has not been validated in all clinical situations. eGFR's persistently <60 mL/min signify possible Chronic Kidney Disease.    Anion gap 5 5 - 15  CBC     Status: Abnormal   Collection Time: 12/19/17  5:53 AM  Result Value Ref Range   WBC 11.9 (H) 4.0 - 10.5 K/uL   RBC 3.39 (L) 4.22 - 5.81 MIL/uL   Hemoglobin 10.9 (L) 13.0 - 17.0 g/dL   HCT 35.3 (L) 39.0 - 52.0 %   MCV 104.1 (H) 78.0 - 100.0 fL   MCH 32.2 26.0 - 34.0 pg   MCHC 30.9 30.0 - 36.0 g/dL   RDW 16.7 (H) 11.5 - 15.5 %   Platelets 93 (L) 150 - 400 K/uL    Comment: RESULT REPEATED AND VERIFIED SPECIMEN CHECKED FOR CLOTS CONSISTENT WITH PREVIOUS RESULT   Basic metabolic panel     Status: Abnormal   Collection Time: 12/19/17  5:53 AM  Result Value Ref Range   Sodium 138 135 - 145 mmol/L   Potassium 3.7 3.5 - 5.1 mmol/L   Chloride 104 101 - 111 mmol/L   CO2 28 22 - 32 mmol/L   Glucose, Bld 115 (H) 65 - 99 mg/dL   BUN 13 6 - 20 mg/dL   Creatinine, Ser 0.86  0.61 - 1.24 mg/dL   Calcium 8.2 (L) 8.9 - 10.3 mg/dL   GFR calc non Af Amer >60 >60  mL/min   GFR calc Af Amer >60 >60 mL/min    Comment: (NOTE) The eGFR has been calculated using the CKD EPI equation. This calculation has not been validated in all clinical situations. eGFR's persistently <60 mL/min signify possible Chronic Kidney Disease.    Anion gap 6 5 - 15    Ir Removal [employer] W/ [place] W/o [place] Mod Sed  Result Date: 12/17/2017 INDICATION: 72 year old with pneumonia and bacteremia. Request for removal of the right jugular Port-A-Cath. Port-A-Cath was placed on 02/01/2017 by Dr. Vernard Gambles. EXAM: REMOVAL RIGHT IJ VEIN PORT-A-CATH MEDICATIONS: Ancef 2 g; The antibiotic was administered within an appropriate time interval prior to skin puncture. ANESTHESIA/SEDATION: Moderate (conscious) sedation was employed during this procedure. A total of Versed 1.0 mg and Fentanyl 50 mcg was administered intravenously. Moderate Sedation Time: 23 minutes. The patient's level of consciousness and vital signs were monitored continuously by radiology nursing throughout the procedure under my direct supervision. FLUOROSCOPY TIME:  None COMPLICATIONS: None immediate. PROCEDURE: Informed written consent was obtained from the patient after a thorough discussion of the procedural risks, benefits and alternatives. All questions were addressed. Maximal Sterile Barrier Technique was utilized including caps, mask, sterile gowns, sterile gloves, sterile drape, hand hygiene and skin antiseptic. A timeout was performed prior to the initiation of the procedure. The right chest was prepped and draped in a sterile fashion. 1% Nesacaine was utilized for local anesthesia because of patient's lidocaine allergy. An incision was made over the previously healed surgical incision. Utilizing blunt dissection, the port catheter and reservoir were removed from the underlying subcutaneous tissue in their entirety. Port pocket was healthy  looking. The pocket was irrigated with a copious amount of sterile normal saline. The pocket was closed with interrupted 3-0 Vicryl stitches. A 4-0 Vicryl running subcuticular stitch was utilized to approximate the skin. Dermabond was applied. IMPRESSION: Successful right IJ vein Port-A-Cath explant. Electronically Signed   By: Markus Daft M.D.   On: 12/17/2017 15:49    Review of Systems  HENT: Positive for nosebleeds.   Respiratory: Positive for sputum production and shortness of breath.   All other systems reviewed and are negative.  Blood pressure 116/77, pulse 93, temperature (!) 97.4 F (36.3 C), temperature source Oral, resp. rate (!) 28, height '5\' 10"'$  (1.778 m), weight 186 lb (84.4 kg), SpO2 97 %. Physical Exam  Constitutional: He is oriented to person, place, and time. He appears well-developed and well-nourished. No distress.  HENT:  Head: Normocephalic and atraumatic.  Right Ear: External ear normal.  Left Ear: External ear normal.  Blood streaking in posterior pharynx.  No active bleeding.  Blood streaking in left nasal passage suctioned.  No active bleeding.  Vessel visible on left septum but not bleeding.  Eyes: Conjunctivae and EOM are normal. Pupils are equal, round, and reactive to light.  Neck: Normal range of motion. Neck supple.  Cardiovascular: Normal rate.  Respiratory: Effort normal.  Musculoskeletal: Normal range of motion.  Neurological: He is alert and oriented to person, place, and time. No cranial nerve deficit.  Skin: Skin is warm and dry.  Psychiatric: He has a normal mood and affect. His behavior is normal. Judgment and thought content normal.    Assessment/Plan: Left epistaxis, anticoagulation  At this time, there is no active bleeding.  I will have nursing apply Afrin spray in the nose a few times per day for the next couple of days. He was instructed to avoid blowing the nose.  Xarelto should be held  until he has not bled for a few days.  Call with  recurrence.  Nicholas Hale 12/19/2017, 9:41 AM

## 2017-12-20 LAB — CBC
HCT: 34.8 % — ABNORMAL LOW (ref 39.0–52.0)
HEMATOCRIT: 36 % — AB (ref 39.0–52.0)
HEMOGLOBIN: 11.1 g/dL — AB (ref 13.0–17.0)
HEMOGLOBIN: 10.8 g/dL — AB (ref 13.0–17.0)
MCH: 32.4 pg (ref 26.0–34.0)
MCH: 32.8 pg (ref 26.0–34.0)
MCHC: 30.8 g/dL (ref 30.0–36.0)
MCHC: 31 g/dL (ref 30.0–36.0)
MCV: 105 fL — ABNORMAL HIGH (ref 78.0–100.0)
MCV: 105.8 fL — ABNORMAL HIGH (ref 78.0–100.0)
Platelets: 100 10*3/uL — ABNORMAL LOW (ref 150–400)
Platelets: 112 10*3/uL — ABNORMAL LOW (ref 150–400)
RBC: 3.43 MIL/uL — ABNORMAL LOW (ref 4.22–5.81)
RBC: 3.29 MIL/uL — AB (ref 4.22–5.81)
RDW: 17.1 % — AB (ref 11.5–15.5)
RDW: 17 % — ABNORMAL HIGH (ref 11.5–15.5)
WBC: 12.5 10*3/uL — AB (ref 4.0–10.5)
WBC: 10.5 10*3/uL (ref 4.0–10.5)

## 2017-12-20 LAB — BASIC METABOLIC PANEL
ANION GAP: 5 (ref 5–15)
BUN: 15 mg/dL (ref 6–20)
CHLORIDE: 104 mmol/L (ref 101–111)
CO2: 30 mmol/L (ref 22–32)
Calcium: 8.3 mg/dL — ABNORMAL LOW (ref 8.9–10.3)
Creatinine, Ser: 0.89 mg/dL (ref 0.61–1.24)
GFR calc non Af Amer: >60 mL/min (ref >60)
Glucose, Bld: 111 mg/dL — ABNORMAL HIGH (ref 65–99)
Potassium: 3.8 mmol/L (ref 3.5–5.1)
Sodium: 139 mmol/L (ref 135–145)

## 2017-12-20 LAB — CULTURE, BLOOD (ROUTINE X 2)
CULTURE: NO GROWTH
Special Requests: ADEQUATE

## 2017-12-20 NOTE — Progress Notes (Addendum)
Patient ID: Nicholas Hale, male   DOB: 06/17/1946, 71 y.o.   MRN: [id number]    PROGRESS NOTE  Nicholas Hale  MRN:[id number] DOB: 12/31/1945 DOA: 12/14/2017  PCP: Rankins, Victoria R, MD   Brief Narrative:  71-year-old male with hypertension, hyperlipidemia, history of PE and DVT, lung cancer stage IV, malignant pericardial effusion, presented with several days duration of progressively worsening dyspnea worse with exertion, associated with productive cough of yellow sputum. CT findings consistent with left lower lobe pneumonia. Patient started on vancomycin and cefepime and TRH asked to admit for further evaluation.  1/20 -  bleed from left nose. Xarelto held. ENT consulted. Bleeding stopped later on 1/20 after afrin spray and with holding xarelto   Assessment & Plan:   Principal Problem:   Sepsis secondary to left lower lobe pneumonia, MSSA bacteremia  - Please note that patient met criteria for sepsis with HR > 90, WBC 24, source left lower lobe pneumonia, MSSA bacteremia suspected from portacath - pt was started initially on vancomycin and cefepime, ID team has been consulted and ABX changed to Cefazolin 2gm IV Q8 hours - Right chest port removed 1/18 - Blood cx repeated and so far showed no growth  - Plan for TEE tomorrow per cardiology    Active Problems:   Mucositis - Continue magic moutwash    Epistaxis - Nose bleed with blood clots 1/20 - Held coumadin - Afrin spray ordered - Consulted ENT but bleeding stopped shortly thereafter so no other intervention was required     Anemia of chronic disease  - Hgb stable      Hyponatremia - Suspect prerenal etiology from poor oral intake in the setting of pneumonia - Sodium stable     Stage IV lung cancer, with metastases - Follows with Dr. Mohamed    History of PE and DVT - Holding xarelto due to epistaxis 1/20    Thrombocytopenia  - Likely chemotherapy-induced  - Platelets 100 this am    Transaminitis - Noticed  slight bump in LFTs - Repeat LFT's in am   DVT prophylaxis:  SCD's Code Status:  full code  Family Communication: wife at bedside  Disposition Plan: TEE in am  Consultants:   ID  Cardio for TEE  ENT 1/20   IR for PAC removal  Procedures:   Right chest port removed 1/18    Antimicrobials:   Vancomycin 12/14/2017 --> 01/17   Cefepime 12/14/2017 --> 01/17   Cefazolin 01/16 -->  Subjective: No overnight events.  Objective: Vitals:   12/19/17 1331 12/19/17 2014 12/19/17 2245 12/20/17 0458  BP: (!) 147/79  137/74 140/74  Pulse: 89 86 78 82  Resp: (!) 24 20 20 20  Temp: (!) 97.5 F (36.4 C)  98.4 F (36.9 C) 97.9 F (36.6 C)  TempSrc: Oral  Oral Oral  SpO2: 96% 97% 93% 93%  Weight:    86.7 kg (191 lb 2.2 oz)  Height:        Intake/Output Summary (Last 24 hours) at 12/20/2017 1321 Last data filed at 12/20/2017 1004 Gross per 24 hour  Intake 480 ml  Output -  Net 480 ml   Filed Weights   12/15/17 1632 12/20/17 0458  Weight: 84.4 kg (186 lb) 86.7 kg (191 lb 2.2 oz)   Physical Exam  Constitutional: Appears well-developed and well-nourished. No distress.  CVS: RRR, S1/S2 + Pulmonary: Diminished breath sounds, no wheezing  Abdominal: Soft. BS +,  no distension, tenderness, rebound or guarding.  Musculoskeletal:   Normal range of motion. No edema and no tenderness.  Lymphadenopathy: No lymphadenopathy noted, cervical, inguinal. Neuro: Alert. No cranial nerve deficit. Skin: Skin is warm and dry.  Psychiatric: Normal mood and affect. Behavior, judgment, thought content normal.     Data Reviewed: I have personally reviewed following labs and imaging studies  CBC: Recent Labs  Lab 12/14/17 1540 12/16/17 0646 12/17/17 0615 12/18/17 0707 12/19/17 0553 12/20/17 0546  WBC 24.1* 14.8* 12.6* 11.4* 11.9* 12.5*  NEUTROABS 20.0*  --  9.8*  --   --   --   HGB 12.9* 10.9* 10.8* 11.0* 10.9* 11.1*  HCT 39.6 35.1* 34.3* 35.6* 35.3* 36.0*  MCV 104.2* 104.2* 103.9*  104.4* 104.1* 105.0*  PLT 103* 72* 72* 89* 93* 100*   Basic Metabolic Panel: Recent Labs  Lab 12/16/17 0646 12/17/17 0615 12/18/17 0707 12/19/17 0553 12/20/17 0546  NA 139 138 136 138 139  K 4.1 3.9 4.1 3.7 3.8  CL 108 107 104 104 104  CO2 25 26 27 28 30  GLUCOSE 105* 102* 108* 115* 111*  BUN 17 15 14 13 15  CREATININE 1.02 0.90 0.91 0.86 0.89  CALCIUM 7.8* 7.9* 8.0* 8.2* 8.3*   Liver Function Tests: Recent Labs  Lab 12/14/17 1540 12/16/17 0646  AST 87* 73*  ALT 81* 72*  ALKPHOS 118 102  BILITOT 0.5 0.4  PROT 6.9 5.8*  ALBUMIN 2.5* 2.0*   Cardiac Enzymes: Recent Labs  Lab 12/14/17 1540  TROPONINI <0.03   CBG: Recent Labs  Lab 12/14/17 1550  GLUCAP 141*   Urine analysis:    Component Value Date/Time   COLORURINE YELLOW 12/14/2017 1721   APPEARANCEUR CLEAR 12/14/2017 1721   LABSPEC 1.017 12/14/2017 1721   PHURINE 5.0 12/14/2017 1721   GLUCOSEU NEGATIVE 12/14/2017 1721   HGBUR NEGATIVE 12/14/2017 1721   BILIRUBINUR NEGATIVE 12/14/2017 1721   KETONESUR NEGATIVE 12/14/2017 1721   PROTEINUR 100 (A) 12/14/2017 1721   UROBILINOGEN 0.2 05/06/2015 0935   NITRITE NEGATIVE 12/14/2017 1721   LEUKOCYTESUR NEGATIVE 12/14/2017 1721   Radiology Studies: Dg Chest 2 View Result Date: 12/14/2017 Left basilar atelectasis.  No active cardiopulmonary disease.   Ct Angio Chest Pe W/cm &/or Wo Cm Result Date: 12/14/2017 1. Suboptimal exam limiting assessment for smaller pulmonary emboli. Allowing for this, there is no evidence of a pulmonary embolus. 2. Extensive left lower lobe consolidation consistent with pneumonia. 3. Findings of metastatic carcinoma noted on the prior chest, abdomen and pelvis CT are similar.   Scheduled Meds: . feeding supplement (ENSURE ENLIVE)  237 mL Oral BID BM  . oxymetazoline  2 spray Each Nare QID  . sodium chloride  2 spray Each Nare Q2H while awake  . sucralfate  1 g Oral TID WC & HS   Continuous Infusions: .  ceFAZolin (ANCEF) IV  Stopped (12/20/17 0651)     LOS: 6 days   Time spent: 25 minutes   Eliza Grissinger, MD [facility] Pager [phone]  If 7PM-7AM, please contact night-coverage [email or web address] Password [email or web address] 12/20/2017, 1:21 PM    

## 2017-12-20 NOTE — H&P (View-Only) (Signed)
Patient ID: Nicholas Hale, male   DOB: 04/18/1946, 72 y.o.   MRN: [id number]    PROGRESS NOTE  Nicholas Hale  FMB:[id number] DOB: 08/09/1946 DOA: 12/14/2017  PCP: [name], MD   Brief Narrative:  72 year old male with hypertension, hyperlipidemia, history of PE and DVT, lung cancer stage IV, malignant pericardial effusion, presented with several days duration of progressively worsening dyspnea worse with exertion, associated with productive cough of yellow sputum. CT findings consistent with left lower lobe pneumonia. Patient started on vancomycin and cefepime and TRH asked to admit for further evaluation.  1/20 -  bleed from left nose. Xarelto held. ENT consulted. Bleeding stopped later on 1/20 after afrin spray and with holding xarelto   Assessment & Plan:   Principal Problem:   Sepsis secondary to left lower lobe pneumonia, MSSA bacteremia  - Please note that patient met criteria for sepsis with HR > 90, WBC 24, source left lower lobe pneumonia, MSSA bacteremia suspected from portacath - pt was started initially on vancomycin and cefepime, ID team has been consulted and ABX changed to Cefazolin 2gm IV Q8 hours - Right chest port removed 1/18 - Blood cx repeated and so far showed no growth  - Plan for TEE tomorrow per cardiology    Active Problems:   Mucositis - Continue magic moutwash    Epistaxis - Nose bleed with blood clots 1/20 - Held coumadin - Afrin spray ordered - Consulted ENT but bleeding stopped shortly thereafter so no other intervention was required     Anemia of chronic disease  - Hgb stable      Hyponatremia - Suspect prerenal etiology from poor oral intake in the setting of pneumonia - Sodium stable     Stage IV lung cancer, with metastases - Follows with Dr. [name]    History of PE and DVT - Holding xarelto due to epistaxis 1/20    Thrombocytopenia  - Likely chemotherapy-induced  - Platelets 100 this am    Transaminitis - Noticed  slight bump in LFTs - Repeat LFT's in am   DVT prophylaxis:  SCD's Code Status:  full code  Family Communication: wife at bedside  Disposition Plan: TEE in am  Consultants:   ID  Cardio for TEE  ENT 1/20   IR for [facility] removal  Procedures:   Right chest port removed 1/18    Antimicrobials:   Vancomycin 12/14/2017 --> 01/17   Cefepime 12/14/2017 --> 01/17   Cefazolin 01/16 -->  Subjective: No overnight events.  Objective: Vitals:   12/19/17 1331 12/19/17 2014 12/19/17 2245 12/20/17 0458  BP: (!) 147/79  137/74 140/74  Pulse: 89 86 78 82  Resp: (!) _0 Temp: (!) 97.5 F (36.4 C)  98.4 F (36.9 C) 97.9 F (36.6 C)  TempSrc: Oral  Oral Oral  SpO2: 96% 97% 93% 93%  Weight:    86.7 kg (191 lb 2.2 oz)  Height:        Intake/Output Summary (Last 24 hours) at 12/20/2017 1321 Last data filed at 12/20/2017 1004 Gross per 24 hour  Intake 480 ml  Output -  Net 480 ml   Filed Weights   12/15/17 1632 12/20/17 0458  Weight: 84.4 kg (186 lb) 86.7 kg (191 lb 2.2 oz)   Physical Exam  Constitutional: Appears well-developed and well-nourished. No distress.  CVS: RRR, S1/S2 + Pulmonary: Diminished breath sounds, no wheezing  Abdominal: Soft. BS +,  no distension, tenderness, rebound or guarding.  Musculoskeletal:  Normal range of motion. No edema and no tenderness.  Lymphadenopathy: No lymphadenopathy noted, cervical, inguinal. Neuro: Alert. No cranial nerve deficit. Skin: Skin is warm and dry.  Psychiatric: Normal mood and affect. Behavior, judgment, thought content normal.     Data Reviewed: I have personally reviewed following labs and imaging studies  CBC: Recent Labs  Lab 12/14/17 1540 12/16/17 0646 12/17/17 0615 12/18/17 0707 12/19/17 0553 12/20/17 0546  WBC 24.1* 14.8* 12.6* 11.4* 11.9* 12.5*  NEUTROABS 20.0*  --  9.8*  --   --   --   HGB 12.9* 10.9* 10.8* 11.0* 10.9* 11.1*  HCT 39.6 35.1* 34.3* 35.6* 35.3* 36.0*  MCV 104.2* 104.2* 103.9*  104.4* 104.1* 105.0*  PLT 103* 72* 72* 89* 93* 277*   Basic Metabolic Panel: Recent Labs  Lab 12/16/17 0646 12/17/17 0615 12/18/17 0707 12/19/17 0553 12/20/17 0546  NA 139 138 136 138 139  K 4.1 3.9 4.1 3.7 3.8  CL 108 107 104 104 104  CO2 _0 GLUCOSE 105* 102* 108* 115* 111*  BUN _1 CREATININE 1.02 0.90 0.91 0.86 0.89  CALCIUM 7.8* 7.9* 8.0* 8.2* 8.3*   Liver Function Tests: Recent Labs  Lab 12/14/17 1540 12/16/17 0646  AST 87* 73*  ALT 81* 72*  ALKPHOS 118 102  BILITOT 0.5 0.4  PROT 6.9 5.8*  ALBUMIN 2.5* 2.0*   Cardiac Enzymes: Recent Labs  Lab 12/14/17 1540  TROPONINI <0.03   CBG: Recent Labs  Lab 12/14/17 1550  GLUCAP 141*   Urine analysis:    Component Value Date/Time   COLORURINE YELLOW 12/14/2017 1721   APPEARANCEUR CLEAR 12/14/2017 1721   LABSPEC 1.017 12/14/2017 1721   PHURINE 5.0 12/14/2017 1721   GLUCOSEU NEGATIVE 12/14/2017 1721   HGBUR NEGATIVE 12/14/2017 1721   BILIRUBINUR NEGATIVE 12/14/2017 1721   KETONESUR NEGATIVE 12/14/2017 1721   PROTEINUR 100 (A) 12/14/2017 1721   UROBILINOGEN 0.2 05/06/2015 0935   NITRITE NEGATIVE 12/14/2017 1721   LEUKOCYTESUR NEGATIVE 12/14/2017 1721   Radiology Studies: Dg Chest 2 View Result Date: 12/14/2017 Left basilar atelectasis.  No active cardiopulmonary disease.   Ct Angio Chest Pe W/cm &/or Wo Cm Result Date: 12/14/2017 1. Suboptimal exam limiting assessment for smaller pulmonary emboli. Allowing for this, there is no evidence of a pulmonary embolus. 2. Extensive left lower lobe consolidation consistent with pneumonia. 3. Findings of metastatic carcinoma noted on the prior chest, abdomen and pelvis CT are similar.   Scheduled Meds: . feeding supplement (ENSURE ENLIVE)  237 mL Oral BID BM  . oxymetazoline  2 spray Each Nare QID  . sodium chloride  2 spray Each Nare Q2H while awake  . sucralfate  1 g Oral TID WC & HS   Continuous Infusions: .  ceFAZolin (ANCEF) IV  Stopped (12/20/17 4128)     LOS: 6 days   Time spent: 25 minutes   Leisa Lenz, MD [facility] Pager [phone]  If 7PM-7AM, please contact night-coverage [email or web address] Password [email or web address] 12/20/2017, 1:21 PM

## 2017-12-20 NOTE — Progress Notes (Signed)
Patient has vital that are showing as charted that are not valid and are not from this patient. Patient vitals are stable, but starting at 2300, vitals are showing from an unvalidated device and vitals are not for this patient. This patient vitals are stable, patient has been resting comfortably without distress. Wife is at bedside. Notified house supervisor that there are vitals that are showing hourly but patient is not being monitored via a vital machine hourly, as orders nor patient status warrant this. House supervisor cannot detect where or how these vitals are showing up for this patient when he is not connected to any device that is monitoring hourly vitals. House supervisor to contact IT to investigate cause further. Patient is resting quietly with no complaints at this time. Will c/t monitor.

## 2017-12-20 NOTE — Progress Notes (Signed)
   12/20/17 1000  Oxygen Therapy  SpO2 98 %  O2 Device Room Air   Pt ambulated the hallway with the above vitals.  Pt tolerated ambulation study well.  Iantha Fallen RN 1000 12/20/2017

## 2017-12-20 NOTE — Progress Notes (Signed)
    [facility] has been requested to perform a transesophageal echocardiogram on Nicholas Hale for bacteremia.  After careful review of history and examination, the risks and benefits of transesophageal echocardiogram have been explained including risks of esophageal damage, perforation (1:10,000 risk), bleeding, pharyngeal hematoma as well as other potential complications associated with conscious sedation including aspiration, arrhythmia, respiratory failure and death. Alternatives to treatment were discussed, questions were answered. Patient is willing to proceed.  TEE - Dr. Johnsie Cancel @  10am . NPO after midnight. Meds with sips.   Update nurse to arrange.  transport 2 hours prior to procedure.   Leanor Kail, PA-C 12/20/2017 12:02 PM

## 2017-12-21 ENCOUNTER — Encounter (HOSPITAL_COMMUNITY)
Admission: EM | Disposition: A | Discharge status: Home Health | Payer: Self-pay | Source: Home / Self Care | Attending: Internal Medicine

## 2017-12-21 ENCOUNTER — Encounter (HOSPITAL_COMMUNITY): Payer: Self-pay | Admitting: *Deleted

## 2017-12-21 ENCOUNTER — Inpatient Hospital Stay (HOSPITAL_COMMUNITY): Payer: Medicare Other

## 2017-12-21 DIAGNOSIS — R7881 Bacteremia: Secondary | ICD-10-CM

## 2017-12-21 HISTORY — PX: TEE WITHOUT CARDIOVERSION: SHX5443

## 2017-12-21 LAB — BASIC METABOLIC PANEL
ANION GAP: 5 (ref 5–15)
BUN: 16 mg/dL (ref 6–20)
CALCIUM: 8 mg/dL — AB (ref 8.9–10.3)
CO2: 30 mmol/L (ref 22–32)
Chloride: 104 mmol/L (ref 101–111)
Creatinine, Ser: 0.81 mg/dL (ref 0.61–1.24)
GFR calc Af Amer: >60 mL/min (ref >60)
Glucose, Bld: 101 mg/dL — ABNORMAL HIGH (ref 65–99)
POTASSIUM: 3.7 mmol/L (ref 3.5–5.1)
SODIUM: 139 mmol/L (ref 135–145)

## 2017-12-21 SURGERY — ECHOCARDIOGRAM, TRANSESOPHAGEAL
Anesthesia: Moderate Sedation

## 2017-12-21 MED ORDER — FENTANYL CITRATE (PF) 100 MCG/2ML IJ SOLN
INTRAMUSCULAR | Status: AC
  Filled 2017-12-21: qty 2

## 2017-12-21 MED ORDER — MIDAZOLAM HCL 10 MG/2ML IJ SOLN
INTRAMUSCULAR | Status: DC | PRN
  Administered 2017-12-21 (×2): 2 mg via INTRAVENOUS

## 2017-12-21 MED ORDER — SODIUM CHLORIDE 0.9 % IV SOLN
INTRAVENOUS | Status: AC | PRN
  Administered 2017-12-21: 500 mL via INTRAMUSCULAR

## 2017-12-21 MED ORDER — FENTANYL CITRATE (PF) 100 MCG/2ML IJ SOLN
INTRAMUSCULAR | Status: DC | PRN
  Administered 2017-12-21: 25 ug via INTRAVENOUS

## 2017-12-21 MED ORDER — BUTAMBEN-TETRACAINE-BENZOCAINE 2-2-14 % EX AERO
INHALATION_SPRAY | CUTANEOUS | Status: DC | PRN
  Administered 2017-12-21: 2 via TOPICAL

## 2017-12-21 MED ORDER — MIDAZOLAM HCL 5 MG/ML IJ SOLN
INTRAMUSCULAR | Status: AC
  Filled 2017-12-21: qty 2

## 2017-12-21 MED ORDER — HYDROCOD POLST-CPM POLST ER 10-8 MG/5ML PO SUER
5.0000 mL | Freq: Two times a day (BID) | ORAL | Status: DC
  Administered 2017-12-21 – 2017-12-24 (×7): 5 mL via ORAL
  Filled 2017-12-21 (×7): qty 5

## 2017-12-21 NOTE — Interval H&P Note (Signed)
History and Physical Interval Note:  12/21/2017 9:48 AM  Nicholas Hale  has presented today for surgery, with the diagnosis of BACTEREMIA  The various methods of treatment have been discussed with the patient and family. After consideration of risks, benefits and other options for treatment, the patient has consented to  Procedure(s): TRANSESOPHAGEAL ECHOCARDIOGRAM (TEE) (N/A) as a surgical intervention .  The patient's history has been reviewed, patient examined, no change in status, stable for surgery.  I have reviewed the patient's chart and labs.  Questions were answered to the patient's satisfaction.     Jenkins Rouge

## 2017-12-21 NOTE — Progress Notes (Signed)
Patient ID: Nicholas Hale, male   DOB: January 27, 1946, 72 y.o.   MRN: [id number]  PROGRESS NOTE    Nicholas Hale  XLK:[id number] DOB: 1946/08/28 DOA: 12/14/2017  PCP: [name], MD   Brief Narrative:  72 year old male with hypertension, hyperlipidemia, history of PE and DVT, lung cancer stage IV, malignant pericardial effusion, presented with several days duration of progressively worsening dyspnea worse with exertion, associated with productive cough of yellow sputum. CT findings consistent with left lower lobe pneumonia. Patient started on vancomycin and cefepime and TRH asked to admit for further evaluation.  1/20 -  bleed from left nose. Xarelto held. ENT consulted. Bleeding stopped later on 1/20 after afrin spray and with holding xarelto   Assessment & Plan:   Principal Problem:   Sepsis secondary to left lower lobe pneumonia, MSSA bacteremia  - Please note that patient met criteria for sepsis with HR > 90, WBC 24, source left lower lobe pneumonia, MSSA bacteremia suspected from portacath - pt was started initially on vancomycin and cefepime, ID team has been consulted and ABX changed to Cefazolin 2gm IV Q8 hours - Right chest port removed 1/18 - Blood cx repeated and so far showed no growth  - TEE planned for today - Per ID, pt needs cefazolin to finish through 01/01/2018   Active Problems:   Mucositis - Continue magic moutwash    Epistaxis - Nose bleed with blood clots 1/20 - Afrin spray ordered - Consulted ENT but bleeding stopped shortly thereafter so no other intervention was required  - Coumadin still on hold    Anemia of chronic disease  - Hgb stable      Hyponatremia - Suspect prerenal etiology from poor oral intake in the setting of pneumonia - Sodium stable     Stage IV lung cancer, with metastases - Follows with Dr. [name]    History of PE and DVT - Holding xarelto due to epistaxis 1/20 - Would plan to resume on Wednesday and monitor for  bleeding     Thrombocytopenia  - Likely chemotherapy-induced  - Platelets improving     Transaminitis - Mild elevation in LFT's - Outpt follow up - Statin was on hold since admission   DVT prophylaxis: SCD's Code Status: full code  Family Communication: wife at bedside Disposition Plan: home likely in next 1-2 days if he feels better, ideally would plan to start xarelto back tomorrow and monitor for any sign of bleeding   Consultants:   ID  Cardio for TEE  ENT 1/20   IR for PAC removal  Procedures:   TEE planned for today  PAC removed 12/17/2017  Antimicrobials:   Vancomycin 12/14/2017 --> 01/17   Cefepime 12/14/2017 --> 01/17   Cefazolin 01/16 -->   Subjective: No overnight events.  Objective: Vitals:   12/21/17 1005 12/21/17 1016 12/21/17 1025 12/21/17 1108  BP: (!) 145/68 (!) 117/46 137/68 130/77  Pulse: 79 87  90  Resp: (!) 21 20    Temp:  98.7 F (37.1 C)  97.6 F (36.4 C)  TempSrc:  Oral  Oral  SpO2: 100% 99%  95%  Weight:      Height:        Intake/Output Summary (Last 24 hours) at 12/21/2017 1522 Last data filed at 12/21/2017 0319 Gross per 24 hour  Intake 400 ml  Output -  Net 400 ml   Filed Weights   12/15/17 1632 12/20/17 0458 12/21/17 0827  Weight: 84.4 kg (186 lb) 86.7 kg (  191 lb 2.2 oz) 86.7 kg (191 lb 2.2 oz)    Examination:  General exam: Appears calm and comfortable  Respiratory system: coarse sounds, no wheezing  Cardiovascular system: S1 & S2 heard, RRR. Gastrointestinal system: Abdomen is nondistended, soft and nontender. No organomegaly or masses felt. Normal bowel sounds heard. Central nervous system: No focal neurological deficits. Extremities: Symmetric 5 x 5 power. Skin: No rashes, lesions or ulcers Psychiatry: Judgement and insight appear normal. Mood & affect appropriate.   Data Reviewed: I have personally reviewed following labs and imaging studies  CBC: Recent Labs  Lab 12/14/17 1540  12/17/17 0615  12/18/17 0707 12/19/17 0553 12/20/17 0546 12/20/17 1359  WBC 24.1*   < > 12.6* 11.4* 11.9* 12.5* 10.5  NEUTROABS 20.0*  --  9.8*  --   --   --   --   HGB 12.9*   < > 10.8* 11.0* 10.9* 11.1* 10.8*  HCT 39.6   < > 34.3* 35.6* 35.3* 36.0* 34.8*  MCV 104.2*   < > 103.9* 104.4* 104.1* 105.0* 105.8*  PLT 103*   < > 72* 89* 93* 100* 112*   < > = values in this interval not displayed.   Basic Metabolic Panel: Recent Labs  Lab 12/17/17 0615 12/18/17 0707 12/19/17 0553 12/20/17 0546 12/21/17 0614  NA 138 136 138 139 139  K 3.9 4.1 3.7 3.8 3.7  CL 107 104 104 104 104  CO2 _0 GLUCOSE 102* 108* 115* 111* 101*  BUN _1 CREATININE 0.90 0.91 0.86 0.89 0.81  CALCIUM 7.9* 8.0* 8.2* 8.3* 8.0*   GFR: Estimated Creatinine Clearance: 86.4 mL/min (by C-G formula based on SCr of 0.81 mg/dL). Liver Function Tests: Recent Labs  Lab 12/14/17 1540 12/16/17 0646  AST 87* 73*  ALT 81* 72*  ALKPHOS 118 102  BILITOT 0.5 0.4  PROT 6.9 5.8*  ALBUMIN 2.5* 2.0*   No results for input(s): LIPASE, AMYLASE in the last 168 hours. No results for input(s): AMMONIA in the last 168 hours. Coagulation Profile: Recent Labs  Lab 12/17/17 0615  INR 1.22   Cardiac Enzymes: Recent Labs  Lab 12/14/17 1540  TROPONINI <0.03   BNP (last 3 results) No results for input(s): PROBNP in the last 8760 hours. HbA1C: No results for input(s): HGBA1C in the last 72 hours. CBG: Recent Labs  Lab 12/14/17 1550  GLUCAP 141*   Lipid Profile: No results for input(s): CHOL, HDL, LDLCALC, TRIG, CHOLHDL, LDLDIRECT in the last 72 hours. Thyroid Function Tests: No results for input(s): TSH, T4TOTAL, FREET4, T3FREE, THYROIDAB in the last 72 hours. Anemia Panel: No results for input(s): VITAMINB12, FOLATE, FERRITIN, TIBC, IRON, RETICCTPCT in the last 72 hours. Urine analysis:    Component Value Date/Time   COLORURINE YELLOW 12/14/2017 1721   APPEARANCEUR CLEAR 12/14/2017 1721   LABSPEC  1.017 12/14/2017 1721   PHURINE 5.0 12/14/2017 1721   GLUCOSEU NEGATIVE 12/14/2017 1721   HGBUR NEGATIVE 12/14/2017 1721   BILIRUBINUR NEGATIVE 12/14/2017 1721   KETONESUR NEGATIVE 12/14/2017 1721   PROTEINUR 100 (A) 12/14/2017 1721   UROBILINOGEN 0.2 05/06/2015 0935   NITRITE NEGATIVE 12/14/2017 1721   LEUKOCYTESUR NEGATIVE 12/14/2017 1721   Sepsis Labs: _2 (procalcitonin:4,lacticidven:4)   ) Recent Results (from the past 240 hour(s))  Culture, blood (routine x 2)     Status: Abnormal   Collection Time: 12/14/17  4:45 PM  Result Value Ref Range Status   Specimen Description BLOOD PORTA CATH [facility]  Final   Special Requests   Final    BOTTLES DRAWN AEROBIC AND ANAEROBIC Blood Culture adequate volume   Culture  Setup Time   Final    GRAM POSITIVE COCCI AEROBIC BOTTLE ONLY CRITICAL RESULT CALLED TO, READ BACK BY AND VERIFIED WITH: E JACKSON 12/15/17 137P JW Performed at [facility], [place] [address], [place], [place] [address]    Culture STAPHYLOCOCCUS AUREUS (A)  Final   Report Status 12/17/2017 FINAL  Final   Organism ID, Bacteria STAPHYLOCOCCUS AUREUS  Final      Susceptibility   Staphylococcus aureus - MIC*    CIPROFLOXACIN <=0.5 SENSITIVE Sensitive     ERYTHROMYCIN >=8 RESISTANT Resistant     GENTAMICIN <=0.5 SENSITIVE Sensitive     OXACILLIN 0.5 SENSITIVE Sensitive     TETRACYCLINE <=1 SENSITIVE Sensitive     VANCOMYCIN <=0.5 SENSITIVE Sensitive     TRIMETH/SULFA <=10 SENSITIVE Sensitive     CLINDAMYCIN <=0.25 SENSITIVE Sensitive     RIFAMPIN <=0.5 SENSITIVE Sensitive     Inducible Clindamycin NEGATIVE Sensitive     * STAPHYLOCOCCUS AUREUS  Blood Culture ID Panel (Reflexed)     Status: Abnormal   Collection Time: 12/14/17  4:45 PM  Result Value Ref Range Status   Enterococcus species NOT DETECTED NOT DETECTED Final   Listeria monocytogenes NOT DETECTED NOT DETECTED Final   Staphylococcus species DETECTED (A) NOT DETECTED Final    Comment: CRITICAL RESULT  CALLED TO, READ BACK BY AND VERIFIED WITH: E JACKSON 12/15/17 137P JW    Staphylococcus aureus DETECTED (A) NOT DETECTED Final    Comment: Methicillin (oxacillin) susceptible Staphylococcus aureus (MSSA). Preferred therapy is anti staphylococcal beta lactam antibiotic (Cefazolin or Nafcillin), unless clinically contraindicated. CRITICAL RESULT CALLED TO, READ BACK BY AND VERIFIED WITH: E JACKSON 12/15/17 137P JW    Methicillin resistance NOT DETECTED NOT DETECTED Final   Streptococcus species NOT DETECTED NOT DETECTED Final   Streptococcus agalactiae NOT DETECTED NOT DETECTED Final   Streptococcus pneumoniae NOT DETECTED NOT DETECTED Final   Streptococcus pyogenes NOT DETECTED NOT DETECTED Final   Acinetobacter baumannii NOT DETECTED NOT DETECTED Final   Enterobacteriaceae species NOT DETECTED NOT DETECTED Final   Enterobacter cloacae complex NOT DETECTED NOT DETECTED Final   Escherichia coli NOT DETECTED NOT DETECTED Final   Klebsiella oxytoca NOT DETECTED NOT DETECTED Final   Klebsiella pneumoniae NOT DETECTED NOT DETECTED Final   Proteus species NOT DETECTED NOT DETECTED Final   Serratia marcescens NOT DETECTED NOT DETECTED Final   Haemophilus influenzae NOT DETECTED NOT DETECTED Final   Neisseria meningitidis NOT DETECTED NOT DETECTED Final   Pseudomonas aeruginosa NOT DETECTED NOT DETECTED Final   Candida albicans NOT DETECTED NOT DETECTED Final   Candida glabrata NOT DETECTED NOT DETECTED Final   Candida krusei NOT DETECTED NOT DETECTED Final   Candida parapsilosis NOT DETECTED NOT DETECTED Final   Candida tropicalis NOT DETECTED NOT DETECTED Final    Comment: Performed at [facility], [place] [address], [place], [place] [address]  Culture, blood (routine x 2)     Status: None   Collection Time: 12/14/17  9:45 PM  Result Value Ref Range Status   Specimen Description BLOOD RIGHT ANTECUBITAL  Final   Special Requests   Final    BOTTLES DRAWN AEROBIC AND ANAEROBIC Blood  Culture adequate volume   Culture   Final    NO GROWTH 5 DAYS Performed at [facility], [place] [address], [place], [place]  [address]    Report Status 12/20/2017 FINAL  Final  Culture, sputum-assessment     Status: None   Collection Time: 12/15/17  5:17 PM  Result Value Ref Range Status   Specimen Description EXPECTORATED SPUTUM  Final   Special Requests NONE  Final   Sputum evaluation THIS SPECIMEN IS ACCEPTABLE FOR SPUTUM CULTURE  Final   Report Status 12/15/2017 FINAL  Final  Culture, respiratory (NON-Expectorated)     Status: None   Collection Time: 12/15/17  5:17 PM  Result Value Ref Range Status   Specimen Description EXPECTORATED SPUTUM  Final   Special Requests NONE Reflexed from [id number]  Final   Gram Stain   Final    ABUNDANT WBC PRESENT, PREDOMINANTLY PMN ABUNDANT GRAM NEGATIVE RODS FEW GRAM POSITIVE COCCI    Culture   Final    Consistent with normal respiratory flora. Performed at [facility], [place] [address] [place], [place] [address]    Report Status 12/17/2017 FINAL  Final  Culture, blood (routine x 2)     Status: None (Preliminary result)   Collection Time: 12/17/17  4:10 PM  Result Value Ref Range Status   Specimen Description BLOOD RIGHT HAND  Final   Special Requests   Final    BOTTLES DRAWN AEROBIC AND ANAEROBIC Blood Culture adequate volume   Culture   Final    NO GROWTH 4 DAYS Performed at [facility], [place] [address], [place], [place] [address]    Report Status PENDING  Incomplete  Culture, blood (routine x 2)     Status: None (Preliminary result)   Collection Time: 12/17/17  4:20 PM  Result Value Ref Range Status   Specimen Description BLOOD RIGHT ANTECUBITAL  Final   Special Requests   Final    BOTTLES DRAWN AEROBIC AND ANAEROBIC Blood Culture adequate volume   Culture   Final    NO GROWTH 4 DAYS Performed at [facility], [place] [address] [place], [place] [address]    Report Status PENDING  Incomplete      Radiology  Studies: Ir Removal [facility] Access W/ [place] W/o [place] Mod Sed  Result Date: 12/17/2017 INDICATION: 72 year old with pneumonia and bacteremia. Request for removal of the right jugular Port-A-Cath. Port-A-Cath was placed on 02/01/2017 by Dr. Vernard Gambles. EXAM: REMOVAL RIGHT IJ VEIN PORT-A-CATH MEDICATIONS: Ancef 2 g; The antibiotic was administered within an appropriate time interval prior to skin puncture. ANESTHESIA/SEDATION: Moderate (conscious) sedation was employed during this procedure. A total of Versed 1.0 mg and Fentanyl 50 mcg was administered intravenously. Moderate Sedation Time: 23 minutes. The patient's level of consciousness and vital signs were monitored continuously by radiology nursing throughout the procedure under my direct supervision. FLUOROSCOPY TIME:  None COMPLICATIONS: None immediate. PROCEDURE: Informed written consent was obtained from the patient after a thorough discussion of the procedural risks, benefits and alternatives. All questions were addressed. Maximal Sterile Barrier Technique was utilized including caps, mask, sterile gowns, sterile gloves, sterile drape, hand hygiene and skin antiseptic. A timeout was performed prior to the initiation of the procedure. The right chest was prepped and draped in a sterile fashion. 1% Nesacaine was utilized for local anesthesia because of patient's lidocaine allergy. An incision was made over the previously healed surgical incision. Utilizing blunt dissection, the port catheter and reservoir were removed from the underlying subcutaneous tissue in their entirety. Port pocket was healthy looking. The pocket was irrigated with a copious amount of sterile normal saline. The pocket was closed  with interrupted 3-0 Vicryl stitches. A 4-0 Vicryl running subcuticular stitch was utilized to approximate the skin. Dermabond was applied. IMPRESSION: Successful right IJ vein Port-A-Cath explant. Electronically Signed   By: Markus Daft M.D.   On: 12/17/2017 15:49         Scheduled Meds: . chlorpheniramine-HYDROcodone  5 mL Oral Q12H  . feeding supplement (ENSURE ENLIVE)  237 mL Oral BID BM  . oxymetazoline  2 spray Each Nare QID  . sodium chloride  2 spray Each Nare Q2H while awake  . sucralfate  1 g Oral TID WC & HS   Continuous Infusions: .  ceFAZolin (ANCEF) IV Stopped (12/21/17 1354)     LOS: 7 days    Time spent: 25 minutes  Greater than 50% of the time spent on counseling and coordinating the care.   Leisa Lenz, MD [facility] Pager [phone]  If 7PM-7AM, please contact night-coverage [email or web address] Password  [facility] 12/21/2017, 3:22 PM

## 2017-12-21 NOTE — Interval H&P Note (Signed)
History and Physical Interval Note:  12/21/2017 8:11 AM  Nicholas Hale  has presented today for surgery, with the diagnosis of BACTEREMIA  The various methods of treatment have been discussed with the patient and family. After consideration of risks, benefits and other options for treatment, the patient has consented to  Procedure(s): TRANSESOPHAGEAL ECHOCARDIOGRAM (TEE) (N/A) as a surgical intervention .  The patient's history has been reviewed, patient examined, no change in status, stable for surgery.  I have reviewed the patient's chart and labs.  Questions were answered to the patient's satisfaction.     Jenkins Rouge

## 2017-12-21 NOTE — CV Procedure (Signed)
During this procedure the patient is administered a total of Versed 2 mg and Fentanyl 25 mg to achieve and maintain moderate conscious sedation.  The patient's heart rate, blood pressure, and oxygen saturation are monitored continuously during the procedure. The period of conscious sedation is 20 minutes, of which I was present face-to-face 100% of this time.  EF 60% Normal valves No vegetations or SBE No effusion Mild aortic debris No ASD/PFO Normal RV  Jenkins Rouge

## 2017-12-22 ENCOUNTER — Encounter (HOSPITAL_COMMUNITY): Payer: Self-pay | Admitting: Cardiovascular Disease

## 2017-12-22 LAB — BASIC METABOLIC PANEL
Anion gap: 9 (ref 5–15)
BUN: 15 mg/dL (ref 6–20)
CALCIUM: 8 mg/dL — AB (ref 8.9–10.3)
CO2: 28 mmol/L (ref 22–32)
Chloride: 102 mmol/L (ref 101–111)
Creatinine, Ser: 0.92 mg/dL (ref 0.61–1.24)
GFR calc Af Amer: >60 mL/min (ref >60)
GLUCOSE: 111 mg/dL — AB (ref 65–99)
Potassium: 3.9 mmol/L (ref 3.5–5.1)
Sodium: 139 mmol/L (ref 135–145)

## 2017-12-22 LAB — CBC
HCT: 37.2 % — ABNORMAL LOW (ref 39.0–52.0)
Hemoglobin: 11.5 g/dL — ABNORMAL LOW (ref 13.0–17.0)
MCH: 32.9 pg (ref 26.0–34.0)
MCHC: 30.9 g/dL (ref 30.0–36.0)
MCV: 106.3 fL — AB (ref 78.0–100.0)
PLATELETS: 116 10*3/uL — AB (ref 150–400)
RBC: 3.5 MIL/uL — ABNORMAL LOW (ref 4.22–5.81)
RDW: 17.7 % — AB (ref 11.5–15.5)
WBC: 12.8 10*3/uL — AB (ref 4.0–10.5)

## 2017-12-22 LAB — CULTURE, BLOOD (ROUTINE X 2)
Culture: NO GROWTH
Culture: NO GROWTH
Special Requests: ADEQUATE
Special Requests: ADEQUATE

## 2017-12-22 MED ORDER — RIVAROXABAN 10 MG PO TABS
10.0000 mg | ORAL_TABLET | Freq: Every day | ORAL | Status: DC
Start: 2017-12-22 — End: 2017-12-24
  Administered 2017-12-22 – 2017-12-23 (×2): 10 mg via ORAL
  Filled 2017-12-22 (×3): qty 1

## 2017-12-22 NOTE — Progress Notes (Signed)
Nutrition Follow-up  DOCUMENTATION CODES:   Non-severe (moderate) malnutrition in context of chronic illness  INTERVENTION:   Ensure Enlive po BID, each supplement provides 350 kcal and 20 grams of protein  NUTRITION DIAGNOSIS:   Moderate Malnutrition related to chronic illness(stage 4 lung cancer w/ mets) as evidenced by mild muscle depletion, mild fat depletion, percent weight loss.  Ongoing  GOAL:   Patient will meet greater than or equal to 90% of their needs  Not meeting  MONITOR:   PO intake, Supplement acceptance, Weight trends  REASON FOR ASSESSMENT:   Malnutrition Screening Tool    ASSESSMENT:   72 yo male presented to ED with complaints of increased weakness, dyspnea, and weight loss of 15 lbs over 3 weeks. Pt currently receiving chemo for stage 4 lung cancer with mets. Admitted for sepsis secondary to pneumonia. PMH includes HTN, PE, DVT, malignant pericardial effusion, and hyperlipidemia. Pt with poor PO intake due to discomfort upon eating.   Appetite remains fair. Pt reports some meals he can tolerate 50% and other times he's not hungry. Meal completions reflect this. Pt ate bacon, eggs, and apple sauce this morning. Denies any issues with swallowing but still feels a little pain. Pt drinks Ensure's when nursing sends them and would like to continue with them. Weight shows to be trending up from 186 lb 1/16 to 191 lb today. Plan for discharge in 1-2 days. Enforced high protein high calorie food options post discharge to persevere lean body mass.   Medications reviewed and include: IV abx Labs reviewed.   Diet Order:  Diet regular Room service appropriate? Yes; Fluid consistency: Thin  EDUCATION NEEDS:   Education needs have been addressed  Skin:  Skin Assessment: Reviewed RN Assessment  Last BM:  12/19/17  Height:   Ht Readings from Last 1 Encounters:  12/21/17 5\' 10"  (1.778 m)    Weight:   Wt Readings from Last 1 Encounters:  12/21/17 191 lb  2.2 oz (86.7 kg)    Ideal Body Weight:  75.5 kg  BMI:  Body mass index is 27.43 kg/m.  Estimated Nutritional Needs:   Kcal:  2000-2200 kcal/day  Protein:  105-115 grams/day  Fluid:  >2 L/day    Mariana Single RD, LDN Clinical Nutrition Pager # - [phone]

## 2017-12-22 NOTE — Progress Notes (Signed)
ANTICOAGULATION CONSULT NOTE - Initial Consult  Pharmacy Consult for Xarelto Indication: VTE prophylaxis  Allergies  Allergen Reactions  . Lidocaine Swelling    "Hard white blisters" formed on tongue, swollen mouth    Patient Measurements: Height: 5\' 10"  (177.8 cm) Weight: 191 lb 2.2 oz (86.7 kg) IBW/kg (Calculated) : 73  Vital Signs: Temp: 98.4 F (36.9 C) (01/23 0900) Temp Source: Oral (01/23 0900) BP: 126/72 (01/23 0900) Pulse Rate: 96 (01/23 0900)  Labs: Recent Labs    12/20/17 0546 12/20/17 1359 12/21/17 0614 12/22/17 0619  HGB 11.1* 10.8*  --  11.5*  HCT 36.0* 34.8*  --  37.2*  PLT 100* 112*  --  116*  CREATININE 0.89  --  0.81 0.92    Estimated Creatinine Clearance: 76 mL/min (by C-G formula based on SCr of 0.92 mg/dL).   Medical History: Past Medical History:  Diagnosis Date  . Abdominal aortic aneurysm ([place])   . Arthritis    "hips; lower spine" (05/24/2014)  . Chemotherapy induced neutropenia ([place]) 03/31/2017  . Chronic fatigue 10/05/2016  . Encounter for antineoplastic chemotherapy 02/17/2017  . History of radiation therapy 10/05/17-10/25/17   left lung/chest 35 Gy in 14 fractions  . Hyperlipidemia   . Hypertension   . Lung cancer ([place]) 09/25/2016  . PE (pulmonary embolism) 9/15  . Pericardial effusion 09/20/2016   Malignant effusion s/p pericardial drain  . Personal history of DVT (deep vein thrombosis) 9/15    Medications:  Scheduled:  . chlorpheniramine-HYDROcodone  5 mL Oral Q12H  . feeding supplement (ENSURE ENLIVE)  237 mL Oral BID BM  . sodium chloride  2 spray Each Nare Q2H while awake  . sucralfate  1 g Oral TID WC & HS    Assessment: Pt has a distant history of DVT and PE indicating the need for long term anticoagulation. D/t epistaxis, pt's home regimen of Xarelto 20 mg QD was held; now trying to resume at a lower dose for DVT PPx and to avoid bleeding.    CBC: Hgb low but stable; plt low (116) but slowly trending upwards   Goal  of Therapy:  Monitor platelets by anticoagulation protocol: Yes  Avoid complications of clotting and bleeding    Plan:   Xarelto 10 mg PO QD   Reduced intensity dosing for prophylaxis against VTE recurrence is acceptable after 6 months of therapy.  Please note however, this reduced-intensity regimen is not recommended if indefinite full anticoagulation is indicated Willaim Bane, [place] 2017) so will eventually need to resume full dose anticoagulation once epistaxis resolves.    Darrick Meigs F Caveness 12/22/2017,11:23 AM

## 2017-12-22 NOTE — Evaluation (Signed)
Physical Therapy Evaluation Patient Details Name: Nicholas Hale MRN: [id number] DOB: 1945-12-10 Today's Date: 12/22/2017   History of Present Illness  Pt admitted with sepsis 2* PNA and MSSA Bacteremia.  Pt with hx of bil THR, ant cervical fusion, and lung CA - currently on chemo  Clinical Impression  Pt admitted as above and presenting with functional mobility limitations 2* generalized weakness and mild ambulatory balance deficits.  Pt should progress to dc home with family assist.    Follow Up Recommendations Home health PT    Equipment Recommendations  None recommended by PT    Recommendations for Other Services       Precautions / Restrictions Precautions Precautions: Fall Restrictions Weight Bearing Restrictions: No      Mobility  Bed Mobility Overal bed mobility: Modified Independent             General bed mobility comments: Pt to EOB unassisted but utilizing bed rail  Transfers Overall transfer level: Needs assistance Equipment used: Rolling walker (2 wheeled) Transfers: Sit to/from Stand Sit to Stand: Min guard         General transfer comment: cues for use of UEs to self assist and min guard steady assst  Ambulation/Gait Ambulation/Gait assistance: Min guard Ambulation Distance (Feet): 400 Feet Assistive device: Rolling walker (2 wheeled) Gait Pattern/deviations: Step-through pattern;Decreased step length - right;Decreased step length - left;Shuffle;Trunk flexed Gait velocity: decr Gait velocity interpretation: Below normal speed for age/gender General Gait Details: min cues for posture and position from RW.  Several short standing rest breaks to complete task  Stairs            Wheelchair Mobility    Modified Rankin (Stroke Patients Only)       Balance Overall balance assessment: Needs assistance Sitting-balance support: No upper extremity supported;Feet supported Sitting balance-Leahy Scale: Good     Standing balance support:  No upper extremity supported Standing balance-Leahy Scale: Fair                               Pertinent Vitals/Pain Pain Assessment: No/denies pain    Home Living Family/patient expects to be discharged to:: Private residence Living Arrangements: Spouse/significant other Available Help at Discharge: Family Type of Home: House Home Access: Stairs to enter Entrance Stairs-Rails: Psychiatric nurse of Steps: 8 Home Layout: Able to live on main level with bedroom/bathroom Home Equipment: Walker - 2 wheels;Bedside commode      Prior Function Level of Independence: Independent               Hand Dominance        Extremity/Trunk Assessment   Upper Extremity Assessment Upper Extremity Assessment: Generalized weakness    Lower Extremity Assessment Lower Extremity Assessment: Generalized weakness       Communication   Communication: No difficulties  Cognition Arousal/Alertness: Awake/alert Behavior During Therapy: WFL for tasks assessed/performed Overall Cognitive Status: Within Functional Limits for tasks assessed                                        General Comments      Exercises     Assessment/Plan    PT Assessment Patient needs continued PT services  PT Problem List Decreased strength;Decreased activity tolerance;Decreased mobility;Decreased balance;Decreased knowledge of use of DME       PT Treatment Interventions DME instruction;Gait  training;Stair training;Functional mobility training;Therapeutic activities;Therapeutic exercise;Patient/family education    PT Goals (Current goals can be found in the Care Plan section)  Acute Rehab PT Goals Patient Stated Goal: Regain IND PT Goal Formulation: With patient Time For Goal Achievement: 01/05/18 Potential to Achieve Goals: Good    Frequency Min 3X/week   Barriers to discharge        Co-evaluation               AM-PAC PT "6 Clicks" Daily  Activity  Outcome Measure Difficulty turning over in bed (including adjusting bedclothes, sheets and blankets)?: A Little Difficulty moving from lying on back to sitting on the side of the bed? : A Lot Difficulty sitting down on and standing up from a chair with arms (e.g., wheelchair, bedside commode, etc,.)?: A Lot Help needed moving to and from a bed to chair (including a wheelchair)?: A Little Help needed walking in hospital room?: A Little Help needed climbing 3-5 steps with a railing? : A Little 6 Click Score: 16    End of Session Equipment Utilized During Treatment: Gait belt Activity Tolerance: Patient tolerated treatment well Patient left: in bed;with call bell/phone within reach;with family/visitor present Nurse Communication: Mobility status PT Visit Diagnosis: Difficulty in walking, not elsewhere classified (R26.2);Muscle weakness (generalized) (M62.81)    Time: 2820-6015 PT Time Calculation (min) (ACUTE ONLY): 22 min   Charges:   PT Evaluation $PT Eval Low Complexity: 1 Low     PT G Codes:        Pg [phone]   Genia Perin 12/22/2017, 2:45 PM

## 2017-12-22 NOTE — Progress Notes (Signed)
Patient ID: Nicholas Hale, male   DOB: 1946-02-16, 72 y.o.   MRN: [id number]  PROGRESS NOTE    Nicholas Hale  WUJ:[id number] DOB: May 22, 1946 DOA: 12/14/2017  PCP: [name], MD   Brief Narrative:  72 year old male with hypertension, hyperlipidemia, history of PE and DVT, lung cancer stage IV, malignant pericardial effusion, presented with several days duration of progressively worsening dyspnea worse with exertion, associated with productive cough of yellow sputum. CT findings consistent with left lower lobe pneumonia. Patient started on vancomycin and cefepime and TRH asked to admit for further evaluation.  1/20 -  bleed from left nose. Xarelto held. ENT consulted. Bleeding stopped later on 1/20 after afrin spray and with holding xarelto   TEE 1/22 - EF 60%. Normal valves. No vegetations or SBE. No effusion.   Assessment & Plan:   Principal Problem:   Sepsis secondary to left lower lobe pneumonia, MSSA bacteremia  - Please note that patient met criteria for sepsis with HR > 90, WBC 24, source left lower lobe pneumonia, MSSA bacteremia suspected from portacath - pt was started initially on vancomycin and cefepime, ID team has been consulted and ABX changed to Cefazolin 2gm IV Q8 hours - Right chest port removed 1/18 - Blood cx repeated and so far showed no growth  - Per ID, pt needs cefazolin to finish through 01/01/2018 - TEE 1/22 -  EF 60%. Normal valves. No vegetations or SBE. No effusion.   Active Problems:   Mucositis - Continue magic mouthwash - Improving    Epistaxis - Nose bleed with blood clots 1/20 - Afrin spray ordered - Consulted ENT but bleeding stopped shortly thereafter so no other intervention was required  - Xarelto on hold, will resume today at lowest possible dose and if bleeding starts again will stop and then will have to look into alternative options for [facility]    Anemia of chronic disease  - Hgb stable      Hyponatremia - Suspect prerenal  etiology from poor oral intake in the setting of pneumonia - Sodium WNL    Stage IV lung cancer, with metastases - Follows with Dr. [name]    History of PE and DVT - Resume xarelto at lowest does possible and if bleeding starts than stop the xarelto     Thrombocytopenia  - Likely chemotherapy-induced  - Improved since admission     Transaminitis - Mild elevation in LFT's - Outpt follow up - Statin was on hold since admission   DVT prophylaxis: Xarelto  Code Status: full code  Family Communication: wife at bedside  Disposition Plan: monitor for bleeds since we  Will resume xarelto today    Consultants:   ID  Cardio for TEE  ENT 1/20   IR for PAC removal  Procedures:   TEE planned for today  PAC removed 12/17/2017  Antimicrobials:   Vancomycin 12/14/2017 --> 01/17   Cefepime 12/14/2017 --> 01/17   Cefazolin 01/16 -->   Subjective: No overnight events.  Objective: Vitals:   12/21/17 1108 12/21/17 2145 12/22/17 0520 12/22/17 0900  BP: 130/77 (!) 141/60 112/62 126/72  Pulse: 90 89 95 96  Resp:  '20 20 18  '$ Temp: 97.6 F (36.4 C) 98.8 F (37.1 C) 98.5 F (36.9 C) 98.4 F (36.9 C)  TempSrc: Oral Oral Oral Oral  SpO2: 95% 97% 96% 97%  Weight:      Height:        Intake/Output Summary (Last 24 hours) at 12/22/2017 1043 Last  data filed at 12/22/2017 0839 Gross per 24 hour  Intake 560 ml  Output 0 ml  Net 560 ml   Filed Weights   12/15/17 1632 12/20/17 0458 12/21/17 0827  Weight: 84.4 kg (186 lb) 86.7 kg (191 lb 2.2 oz) 86.7 kg (191 lb 2.2 oz)    Physical Exam  Constitutional: Appears well-developed and well-nourished. No distress.  CVS: RRR, S1/S2 + Pulmonary: coarse sounds, no wheezing  Abdominal: Soft. BS +,  no distension, tenderness, rebound or guarding.  Musculoskeletal: Normal range of motion. No edema and no tenderness.  Lymphadenopathy: No lymphadenopathy noted, cervical, inguinal. Neuro: Alert. Normal reflexes, muscle tone  coordination. No cranial nerve deficit. Skin: Skin is warm and dry.  Psychiatric: Normal mood and affect. Behavior, judgment, thought content normal.     Data Reviewed: I have personally reviewed following labs and imaging studies  CBC: Recent Labs  Lab 12/17/17 0615 12/18/17 0707 12/19/17 0553 12/20/17 0546 12/20/17 1359 12/22/17 0619  WBC 12.6* 11.4* 11.9* 12.5* 10.5 12.8*  NEUTROABS 9.8*  --   --   --   --   --   HGB 10.8* 11.0* 10.9* 11.1* 10.8* 11.5*  HCT 34.3* 35.6* 35.3* 36.0* 34.8* 37.2*  MCV 103.9* 104.4* 104.1* 105.0* 105.8* 106.3*  PLT 72* 89* 93* 100* 112* 076*   Basic Metabolic Panel: Recent Labs  Lab 12/18/17 0707 12/19/17 0553 12/20/17 0546 12/21/17 0614 12/22/17 0619  NA 136 138 139 139 139  K 4.1 3.7 3.8 3.7 3.9  CL 104 104 104 104 102  CO2 '27 28 30 30 28  '$ GLUCOSE 108* 115* 111* 101* 111*  BUN '14 13 15 16 15  '$ CREATININE 0.91 0.86 0.89 0.81 0.92  CALCIUM 8.0* 8.2* 8.3* 8.0* 8.0*   GFR: Estimated Creatinine Clearance: 76 mL/min (by C-G formula based on SCr of 0.92 mg/dL). Liver Function Tests: Recent Labs  Lab 12/16/17 0646  AST 73*  ALT 72*  ALKPHOS 102  BILITOT 0.4  PROT 5.8*  ALBUMIN 2.0*   No results for input(s): LIPASE, AMYLASE in the last 168 hours. No results for input(s): AMMONIA in the last 168 hours. Coagulation Profile: Recent Labs  Lab 12/17/17 0615  INR 1.22   Cardiac Enzymes: No results for input(s): CKTOTAL, CKMB, CKMBINDEX, TROPONINI in the last 168 hours. BNP (last 3 results) No results for input(s): PROBNP in the last 8760 hours. HbA1C: No results for input(s): HGBA1C in the last 72 hours. CBG: No results for input(s): GLUCAP in the last 168 hours. Lipid Profile: No results for input(s): CHOL, HDL, LDLCALC, TRIG, CHOLHDL, LDLDIRECT in the last 72 hours. Thyroid Function Tests: No results for input(s): TSH, T4TOTAL, FREET4, T3FREE, THYROIDAB in the last 72 hours. Anemia Panel: No results for input(s):  VITAMINB12, FOLATE, FERRITIN, TIBC, IRON, RETICCTPCT in the last 72 hours. Urine analysis:    Component Value Date/Time   COLORURINE YELLOW 12/14/2017 1721   APPEARANCEUR CLEAR 12/14/2017 1721   LABSPEC 1.017 12/14/2017 1721   PHURINE 5.0 12/14/2017 1721   GLUCOSEU NEGATIVE 12/14/2017 1721   HGBUR NEGATIVE 12/14/2017 1721   BILIRUBINUR NEGATIVE 12/14/2017 1721   KETONESUR NEGATIVE 12/14/2017 1721   PROTEINUR 100 (A) 12/14/2017 1721   UROBILINOGEN 0.2 05/06/2015 0935   NITRITE NEGATIVE 12/14/2017 1721   LEUKOCYTESUR NEGATIVE 12/14/2017 1721   Sepsis Labs: '@LABRCNTIP'$ (procalcitonin:4,lacticidven:4)   ) Recent Results (from the past 240 hour(s))  Culture, blood (routine x 2)     Status: Abnormal   Collection Time: 12/14/17  4:45 PM  Result  Value Ref Range Status   Specimen Description BLOOD PORTA CATH [facility]  Final   Special Requests   Final    BOTTLES DRAWN AEROBIC AND ANAEROBIC Blood Culture adequate volume   Culture  Setup Time   Final    GRAM POSITIVE COCCI AEROBIC BOTTLE ONLY CRITICAL RESULT CALLED TO, READ BACK BY AND VERIFIED WITH: E JACKSON 12/15/17 137P JW Performed at [facility], [place] [address], [place], [place] [address]    Culture STAPHYLOCOCCUS AUREUS (A)  Final   Report Status 12/17/2017 FINAL  Final   Organism ID, Bacteria STAPHYLOCOCCUS AUREUS  Final      Susceptibility   Staphylococcus aureus - MIC*    CIPROFLOXACIN <=0.5 SENSITIVE Sensitive     ERYTHROMYCIN >=8 RESISTANT Resistant     GENTAMICIN <=0.5 SENSITIVE Sensitive     OXACILLIN 0.5 SENSITIVE Sensitive     TETRACYCLINE <=1 SENSITIVE Sensitive     VANCOMYCIN <=0.5 SENSITIVE Sensitive     TRIMETH/SULFA <=10 SENSITIVE Sensitive     CLINDAMYCIN <=0.25 SENSITIVE Sensitive     RIFAMPIN <=0.5 SENSITIVE Sensitive     Inducible Clindamycin NEGATIVE Sensitive     * STAPHYLOCOCCUS AUREUS  Blood Culture ID Panel (Reflexed)     Status: Abnormal   Collection Time: 12/14/17  4:45 PM  Result Value Ref  Range Status   Enterococcus species NOT DETECTED NOT DETECTED Final   Listeria monocytogenes NOT DETECTED NOT DETECTED Final   Staphylococcus species DETECTED (A) NOT DETECTED Final    Comment: CRITICAL RESULT CALLED TO, READ BACK BY AND VERIFIED WITH: E JACKSON 12/15/17 137P JW    Staphylococcus aureus DETECTED (A) NOT DETECTED Final    Comment: Methicillin (oxacillin) susceptible Staphylococcus aureus (MSSA). Preferred therapy is anti staphylococcal beta lactam antibiotic (Cefazolin or Nafcillin), unless clinically contraindicated. CRITICAL RESULT CALLED TO, READ BACK BY AND VERIFIED WITH: E JACKSON 12/15/17 137P JW    Methicillin resistance NOT DETECTED NOT DETECTED Final   Streptococcus species NOT DETECTED NOT DETECTED Final   Streptococcus agalactiae NOT DETECTED NOT DETECTED Final   Streptococcus pneumoniae NOT DETECTED NOT DETECTED Final   Streptococcus pyogenes NOT DETECTED NOT DETECTED Final   Acinetobacter baumannii NOT DETECTED NOT DETECTED Final   Enterobacteriaceae species NOT DETECTED NOT DETECTED Final   Enterobacter cloacae complex NOT DETECTED NOT DETECTED Final   Escherichia coli NOT DETECTED NOT DETECTED Final   Klebsiella oxytoca NOT DETECTED NOT DETECTED Final   Klebsiella pneumoniae NOT DETECTED NOT DETECTED Final   Proteus species NOT DETECTED NOT DETECTED Final   Serratia marcescens NOT DETECTED NOT DETECTED Final   Haemophilus influenzae NOT DETECTED NOT DETECTED Final   Neisseria meningitidis NOT DETECTED NOT DETECTED Final   Pseudomonas aeruginosa NOT DETECTED NOT DETECTED Final   Candida albicans NOT DETECTED NOT DETECTED Final   Candida glabrata NOT DETECTED NOT DETECTED Final   Candida krusei NOT DETECTED NOT DETECTED Final   Candida parapsilosis NOT DETECTED NOT DETECTED Final   Candida tropicalis NOT DETECTED NOT DETECTED Final    Comment: Performed at [facility], [place] [address], [place], [place] [address]  Culture, blood (routine x 2)      Status: None   Collection Time: 12/14/17  9:45 PM  Result Value Ref Range Status   Specimen Description BLOOD RIGHT ANTECUBITAL  Final   Special Requests   Final    BOTTLES DRAWN AEROBIC AND ANAEROBIC Blood Culture adequate volume   Culture   Final    NO GROWTH 5  DAYS Performed at [facility], [place] [address], [place], [place] [address]    Report Status 12/20/2017 FINAL  Final  Culture, sputum-assessment     Status: None   Collection Time: 12/15/17  5:17 PM  Result Value Ref Range Status   Specimen Description EXPECTORATED SPUTUM  Final   Special Requests NONE  Final   Sputum evaluation THIS SPECIMEN IS ACCEPTABLE FOR SPUTUM CULTURE  Final   Report Status 12/15/2017 FINAL  Final  Culture, respiratory (NON-Expectorated)     Status: None   Collection Time: 12/15/17  5:17 PM  Result Value Ref Range Status   Specimen Description EXPECTORATED SPUTUM  Final   Special Requests NONE Reflexed from [id number]  Final   Gram Stain   Final    ABUNDANT WBC PRESENT, PREDOMINANTLY PMN ABUNDANT GRAM NEGATIVE RODS FEW GRAM POSITIVE COCCI    Culture   Final    Consistent with normal respiratory flora. Performed at [facility], [place] [address] [place], [place] [address]    Report Status 12/17/2017 FINAL  Final  Culture, blood (routine x 2)     Status: None   Collection Time: 12/17/17  4:10 PM  Result Value Ref Range Status   Specimen Description BLOOD RIGHT HAND  Final   Special Requests   Final    BOTTLES DRAWN AEROBIC AND ANAEROBIC Blood Culture adequate volume   Culture   Final    NO GROWTH 5 DAYS Performed at [facility], [place] [address] [place], [place] [address]    Report Status 12/22/2017 FINAL  Final  Culture, blood (routine x 2)     Status: None   Collection Time: 12/17/17  4:20 PM  Result Value Ref Range Status   Specimen Description BLOOD RIGHT ANTECUBITAL  Final   Special Requests   Final    BOTTLES DRAWN AEROBIC AND ANAEROBIC Blood Culture adequate volume    Culture   Final    NO GROWTH 5 DAYS Performed at [facility], [place] [address] [place],  [address]    Report Status 12/22/2017 FINAL  Final      Radiology Studies: No results found.      Scheduled Meds: . chlorpheniramine-HYDROcodone  5 mL Oral Q12H  . feeding supplement (ENSURE ENLIVE)  237 mL Oral BID BM  . sodium chloride  2 spray Each Nare Q2H while awake  . sucralfate  1 g Oral TID WC & HS   Continuous Infusions: .  ceFAZolin (ANCEF) IV Stopped (12/22/17 0557)     LOS: 8 days    Time spent: 25 minutes  Greater than 50% of the time spent on counseling and coordinating the care.   Leisa Lenz, MD [facility] Pager [phone]  If 7PM-7AM, please contact night-coverage [email or web address] Password [facility] 12/22/2017, 10:43 AM

## 2017-12-22 NOTE — Progress Notes (Signed)
Date: December 22, 2017 Velva Harman, BSN, [place] [facility], [place] Chart and notes review for patient progress and needs. Will follow for case management and discharge needs. No cm or discharge needs present at time of this review. Next review date: [id number]

## 2017-12-23 ENCOUNTER — Other Ambulatory Visit: Payer: Medicare Other

## 2017-12-23 DIAGNOSIS — R7881 Bacteremia: Secondary | ICD-10-CM

## 2017-12-23 DIAGNOSIS — J181 Lobar pneumonia, unspecified organism: Secondary | ICD-10-CM

## 2017-12-23 DIAGNOSIS — C3490 Malignant neoplasm of unspecified part of unspecified bronchus or lung: Secondary | ICD-10-CM

## 2017-12-23 DIAGNOSIS — T80219S Unspecified infection due to central venous catheter, sequela: Secondary | ICD-10-CM

## 2017-12-23 LAB — CBC
HCT: 35 % — ABNORMAL LOW (ref 39.0–52.0)
Hemoglobin: 10.8 g/dL — ABNORMAL LOW (ref 13.0–17.0)
MCH: 32.7 pg (ref 26.0–34.0)
MCHC: 30.9 g/dL (ref 30.0–36.0)
MCV: 106.1 fL — ABNORMAL HIGH (ref 78.0–100.0)
PLATELETS: 103 10*3/uL — AB (ref 150–400)
RBC: 3.3 MIL/uL — ABNORMAL LOW (ref 4.22–5.81)
RDW: 17.9 % — AB (ref 11.5–15.5)
WBC: 11.3 10*3/uL — ABNORMAL HIGH (ref 4.0–10.5)

## 2017-12-23 LAB — BASIC METABOLIC PANEL
Anion gap: 9 (ref 5–15)
BUN: 15 mg/dL (ref 6–20)
CALCIUM: 8 mg/dL — AB (ref 8.9–10.3)
CO2: 29 mmol/L (ref 22–32)
CREATININE: 0.94 mg/dL (ref 0.61–1.24)
Chloride: 101 mmol/L (ref 101–111)
GFR calc Af Amer: >60 mL/min (ref >60)
Glucose, Bld: 127 mg/dL — ABNORMAL HIGH (ref 65–99)
Potassium: 4 mmol/L (ref 3.5–5.1)
SODIUM: 139 mmol/L (ref 135–145)

## 2017-12-23 MED ORDER — CEFAZOLIN IV (FOR PTA / DISCHARGE USE ONLY): 2.0000 g | Freq: Three times a day (TID) | INTRAVENOUS | 0 refills | Status: AC

## 2017-12-23 MED ORDER — BISACODYL 10 MG RE SUPP
10.0000 mg | Freq: Every day | RECTAL | Status: DC
  Filled 2017-12-23: qty 1

## 2017-12-23 MED ORDER — POLYETHYLENE GLYCOL 3350 17 G PO PACK
17.0000 g | PACK | Freq: Every day | ORAL | Status: DC
  Administered 2017-12-23: 17 g via ORAL
  Filled 2017-12-23: qty 1

## 2017-12-23 MED ORDER — SENNOSIDES-DOCUSATE SODIUM 8.6-50 MG PO TABS
1.0000 | ORAL_TABLET | Freq: Two times a day (BID) | ORAL | Status: DC
  Administered 2017-12-23 (×2): 1 via ORAL
  Filled 2017-12-23 (×3): qty 1

## 2017-12-23 NOTE — Progress Notes (Signed)
[id number]/OJ abx will be done through [facility] Carolynn Sayers aware.

## 2017-12-23 NOTE — Progress Notes (Signed)
Patient ID: Nicholas Hale, male   DOB: 1946/08/19, 72 y.o.   MRN: [id number]  PROGRESS NOTE    Nicholas Hale  ONG:[id number] DOB: 03-04-1946 DOA: 12/14/2017  PCP: [name], MD   Brief Narrative:  72 year old male with hypertension, hyperlipidemia, history of PE and DVT, lung cancer stage IV, malignant pericardial effusion, presented with several days duration of progressively worsening dyspnea worse with exertion, associated with productive cough of yellow sputum. CT findings consistent with left lower lobe pneumonia. Patient started on vancomycin and cefepime and TRH asked to admit for further evaluation.  1/20 -  bleed from left nose. Xarelto held. ENT consulted. Bleeding stopped later on 1/20 after afrin spray and with holding xarelto   TEE 1/22 - EF 60%. Normal valves. No vegetations or SBE. No effusion.   Assessment & Plan:   Principal Problem:   Sepsis secondary to left lower lobe pneumonia, MSSA bacteremia  - Please note that patient met criteria for sepsis with HR > 90, WBC 24, source left lower lobe pneumonia, MSSA bacteremia suspected from portacath, Right chest port removed 1/18 -  TEE 1/22 -  EF 60%. Normal valves. No vegetations or SBE. No effusion. -pt was started initially on vancomycin and cefepime, ID team has been consulted and ABX changed to Cefazolin 2gm IV Q8 hours,  Per ID, pt needs cefazolin to finish through 01/01/2018 -- Blood cx repeated and so far showed no growth  -place picc line, plan to d/c home on iv abx on 1/25 (mid line would suffice outpatient abx, however patient request picc line being placed due to poor iv access and needing outpatient chemo, case discussed with Dr [name] who agreed with picc line placement)      Mucositis - Continue magic mouthwash - Improving    Epistaxis - Nose bleed with blood clots 1/20 - Afrin spray ordered - Consulted ENT but bleeding stopped shortly thereafter so no other intervention was required  -  Xarelto on hold, will resume today at lowest possible dose -plan to resume full dose xarelto at discharge if no further nose bleed.    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 [name]    History of PE and DVT - Resume xarelto at lowest does possible and if bleeding starts than stop the xarelto  -plan to resume full dose xarelto at discharge    Thrombocytopenia  -plt 72 on admission - Likely chemotherapy-induced  - Improved since admission     Transaminitis - Mild elevation in LFT's, from sepsis? - repeat lft in am - Statin was on hold since admission ,Outpt follow up  Constipation: stool softener  DVT prophylaxis: Xarelto  Code Status: full code  Family Communication: wife at bedside  Disposition Plan: picc line placement then d/c on 1/25   Consultants:   ID Dr Baxter Flattery  Cardio for TEE  ENT 1/20  IR for chemo Port removal  Procedures:   TEE 1/22  Right chest chemoport  removed 12/17/2017  picc placement planned on 1/25  Antimicrobials:   Vancomycin 12/14/2017 --> 01/17   Cefepime 12/14/2017 --> 01/17   Cefazolin 01/16 --> 2/2   Subjective:  No nose bleed, denies pain, no fever He reports being constipated Wife at bedside  Objective: Vitals:   12/22/17 1324 12/22/17 2042 12/23/17  0514 12/23/17 1454  BP: 136/71 139/84 134/73 135/73  Pulse: 93 84 (!) 113 80  Resp: '16 20 20 20  '$ Temp: 98 F (36.7 C) 98.2 F (36.8 C) 98.2 F (36.8 C) 98.5 F (36.9 C)  TempSrc: Oral Oral Oral Oral  SpO2: 99% 95% 91% 93%  Weight:      Height:        Intake/Output Summary (Last 24 hours) at 12/23/2017 1552 Last data filed at 12/23/2017 0634 Gross per 24 hour  Intake 200 ml  Output -  Net 200 ml   Filed Weights   12/15/17 1632 12/20/17 0458 12/21/17 0827  Weight: 84.4 kg (186 lb) 86.7 kg (191 lb 2.2 oz) 86.7 kg (191 lb 2.2 oz)    Physical Exam  Constitutional: Appears well-developed and well-nourished. No distress.  CVS: RRR, S1/S2 + Pulmonary: diminished but no significant wheezing, no rales, no rhonchi  Abdominal: Soft. BS +,  no distension, tenderness, rebound or guarding.  Musculoskeletal: Normal range of motion. No edema and no tenderness.  Lymphadenopathy: No lymphadenopathy noted, cervical, inguinal. Neuro: Alert. Normal reflexes, muscle tone coordination. No cranial nerve deficit. Skin: Skin is warm and dry.  Psychiatric: Normal mood and affect. Behavior, judgment, thought content normal.     Data Reviewed: I have personally reviewed following labs and imaging studies  CBC: Recent Labs  Lab 12/17/17 0615  12/19/17 0553 12/20/17 0546 12/20/17 1359 12/22/17 0619 12/23/17 0545  WBC 12.6*   < > 11.9* 12.5* 10.5 12.8* 11.3*  NEUTROABS 9.8*  --   --   --   --   --   --   HGB 10.8*   < > 10.9* 11.1* 10.8* 11.5* 10.8*  HCT 34.3*   < > 35.3* 36.0* 34.8* 37.2* 35.0*  MCV 103.9*   < > 104.1* 105.0* 105.8* 106.3* 106.1*  PLT 72*   < > 93* 100* 112* 116* 103*   < > = values in this interval not displayed.   Basic Metabolic Panel: Recent Labs  Lab 12/19/17 0553 12/20/17 0546 12/21/17 0614 12/22/17 0619 12/23/17 0545  NA 138 139 139 139 139  K 3.7 3.8 3.7 3.9 4.0  CL 104 104 104 102 101  CO2 '28 30 30 28 29  '$ GLUCOSE 115* 111* 101* 111* 127*  BUN '13 15 16  15 15  '$ CREATININE 0.86 0.89 0.81 0.92 0.94  CALCIUM 8.2* 8.3* 8.0* 8.0* 8.0*   GFR: Estimated Creatinine Clearance: 74.4 mL/min (by C-G formula based on SCr of 0.94 mg/dL). Liver Function Tests: No results for input(s): AST, ALT, ALKPHOS, BILITOT, PROT, ALBUMIN in the last 168 hours. No results for input(s): LIPASE, AMYLASE in the last 168 hours. No results for input(s): AMMONIA in the last 168 hours. Coagulation Profile: Recent Labs  Lab 12/17/17 0615  INR 1.22   Cardiac Enzymes: No results for input(s): CKTOTAL, CKMB, CKMBINDEX, TROPONINI in the last 168 hours. BNP (last 3 results) No results for input(s): PROBNP in the last 8760 hours. HbA1C: No results for input(s): HGBA1C in the last 72 hours. CBG: No results for  input(s): GLUCAP in the last 168 hours. Lipid Profile: No results for input(s): CHOL, HDL, LDLCALC, TRIG, CHOLHDL, LDLDIRECT in the last 72 hours. Thyroid Function Tests: No results for input(s): TSH, T4TOTAL, FREET4, T3FREE, THYROIDAB in the last 72 hours. Anemia Panel: No results for input(s): VITAMINB12, FOLATE, FERRITIN, TIBC, IRON, RETICCTPCT in the last 72 hours. Urine analysis:    Component Value Date/Time   COLORURINE YELLOW 12/14/2017 1721   APPEARANCEUR CLEAR 12/14/2017 1721   LABSPEC 1.017 12/14/2017 1721   PHURINE 5.0 12/14/2017 1721   GLUCOSEU NEGATIVE 12/14/2017 1721   HGBUR NEGATIVE 12/14/2017 1721   BILIRUBINUR NEGATIVE 12/14/2017 1721   KETONESUR NEGATIVE 12/14/2017 1721   PROTEINUR 100 (A) 12/14/2017 1721   UROBILINOGEN 0.2 05/06/2015 0935   NITRITE NEGATIVE 12/14/2017 1721   LEUKOCYTESUR NEGATIVE 12/14/2017 1721   Sepsis Labs: '@LABRCNTIP'$ (procalcitonin:4,lacticidven:4)   ) Recent Results (from the past 240 hour(s))  Culture, blood (routine x 2)     Status: Abnormal   Collection Time: 12/14/17  4:45 PM  Result Value Ref Range Status   Specimen Description BLOOD PORTA CATH [facility]  Final   Special Requests   Final    BOTTLES DRAWN  AEROBIC AND ANAEROBIC Blood Culture adequate volume   Culture  Setup Time   Final    GRAM POSITIVE COCCI AEROBIC BOTTLE ONLY CRITICAL RESULT CALLED TO, READ BACK BY AND VERIFIED WITH: E JACKSON 12/15/17 137P JW Performed at [facility], [place] [address], [place], [place] [address]    Culture STAPHYLOCOCCUS AUREUS (A)  Final   Report Status 12/17/2017 FINAL  Final   Organism ID, Bacteria STAPHYLOCOCCUS AUREUS  Final      Susceptibility   Staphylococcus aureus - MIC*    CIPROFLOXACIN <=0.5 SENSITIVE Sensitive     ERYTHROMYCIN >=8 RESISTANT Resistant     GENTAMICIN <=0.5 SENSITIVE Sensitive     OXACILLIN 0.5 SENSITIVE Sensitive     TETRACYCLINE <=1 SENSITIVE Sensitive     VANCOMYCIN <=0.5 SENSITIVE Sensitive     TRIMETH/SULFA <=10 SENSITIVE Sensitive     CLINDAMYCIN <=0.25 SENSITIVE Sensitive     RIFAMPIN <=0.5 SENSITIVE Sensitive     Inducible Clindamycin NEGATIVE Sensitive     * STAPHYLOCOCCUS AUREUS  Blood Culture ID Panel (Reflexed)     Status: Abnormal   Collection Time: 12/14/17  4:45 PM  Result Value Ref Range Status   Enterococcus species NOT DETECTED NOT DETECTED Final   Listeria monocytogenes NOT DETECTED NOT DETECTED Final   Staphylococcus species DETECTED (A) NOT DETECTED Final    Comment: CRITICAL RESULT CALLED TO, READ BACK BY AND VERIFIED WITH: E JACKSON 12/15/17 137P JW    Staphylococcus aureus DETECTED (A) NOT DETECTED Final    Comment: Methicillin (oxacillin) susceptible Staphylococcus aureus (MSSA). Preferred therapy is anti staphylococcal beta lactam antibiotic (Cefazolin or Nafcillin), unless clinically contraindicated. CRITICAL RESULT CALLED TO, READ BACK BY AND VERIFIED WITH: E JACKSON 12/15/17 137P JW    Methicillin resistance NOT DETECTED NOT DETECTED Final   Streptococcus species NOT DETECTED NOT DETECTED Final   Streptococcus agalactiae NOT DETECTED NOT DETECTED Final   Streptococcus pneumoniae NOT DETECTED NOT DETECTED Final   Streptococcus pyogenes  NOT DETECTED NOT DETECTED Final   Acinetobacter baumannii NOT DETECTED NOT DETECTED Final   Enterobacteriaceae species NOT DETECTED NOT DETECTED Final   Enterobacter cloacae complex NOT DETECTED NOT DETECTED Final   Escherichia coli NOT DETECTED NOT DETECTED Final   Klebsiella oxytoca NOT DETECTED NOT DETECTED Final   Klebsiella pneumoniae NOT DETECTED  NOT DETECTED Final   Proteus species NOT DETECTED NOT DETECTED Final   Serratia marcescens NOT DETECTED NOT DETECTED Final   Haemophilus influenzae NOT DETECTED NOT DETECTED Final   Neisseria meningitidis NOT DETECTED NOT DETECTED Final   Pseudomonas aeruginosa NOT DETECTED NOT DETECTED Final   Candida albicans NOT DETECTED NOT DETECTED Final   Candida glabrata NOT DETECTED NOT DETECTED Final   Candida krusei NOT DETECTED NOT DETECTED Final   Candida parapsilosis NOT DETECTED NOT DETECTED Final   Candida tropicalis NOT DETECTED NOT DETECTED Final    Comment: Performed at [facility], [place] [address], [place], [place] [address]  Culture, blood (routine x 2)     Status: None   Collection Time: 12/14/17  9:45 PM  Result Value Ref Range Status   Specimen Description BLOOD RIGHT ANTECUBITAL  Final   Special Requests   Final    BOTTLES DRAWN AEROBIC AND ANAEROBIC Blood Culture adequate volume   Culture   Final    NO GROWTH 5 DAYS Performed at [facility], [place] [address] [place], [place] [address]    Report Status 12/20/2017 FINAL  Final  Culture, sputum-assessment     Status: None   Collection Time: 12/15/17  5:17 PM  Result Value Ref Range Status   Specimen Description EXPECTORATED SPUTUM  Final   Special Requests NONE  Final   Sputum evaluation THIS SPECIMEN IS ACCEPTABLE FOR SPUTUM CULTURE  Final   Report Status 12/15/2017 FINAL  Final  Culture, respiratory (NON-Expectorated)     Status: None   Collection Time: 12/15/17  5:17 PM  Result Value Ref Range Status   Specimen Description EXPECTORATED SPUTUM  Final   Special  Requests NONE Reflexed from [id number]  Final   Gram Stain   Final    ABUNDANT WBC PRESENT, PREDOMINANTLY PMN ABUNDANT GRAM NEGATIVE RODS FEW GRAM POSITIVE COCCI    Culture   Final    Consistent with normal respiratory flora. Performed at [facility], [place] [address] [place], [place] [address]    Report Status 12/17/2017 FINAL  Final  Culture, blood (routine x 2)     Status: None   Collection Time: 12/17/17  4:10 PM  Result Value Ref Range Status   Specimen Description BLOOD RIGHT HAND  Final   Special Requests   Final    BOTTLES DRAWN AEROBIC AND ANAEROBIC Blood Culture adequate volume   Culture   Final    NO GROWTH 5 DAYS Performed at [facility], [place] [address] [place], [place] [address]    Report Status 12/22/2017 FINAL  Final  Culture, blood (routine x 2)     Status: None   Collection Time: 12/17/17  4:20 PM  Result Value Ref Range Status   Specimen Description BLOOD RIGHT ANTECUBITAL  Final   Special Requests   Final    BOTTLES DRAWN AEROBIC AND ANAEROBIC Blood Culture adequate volume   Culture   Final    NO GROWTH 5 DAYS Performed at [facility], [place] [address] [place], [place] [address]    Report Status 12/22/2017 FINAL  Final      Radiology Studies: No results found.      Scheduled Meds: . bisacodyl  10 mg Rectal Daily  . chlorpheniramine-HYDROcodone  5 mL Oral Q12H  . feeding supplement (ENSURE ENLIVE)  237 mL Oral BID BM  . polyethylene glycol  17 g Oral Daily  . rivaroxaban  10 mg Oral Q  supper  . senna-docusate  1 tablet Oral BID  . sodium chloride  2 spray Each Nare Q2H while awake  . sucralfate  1 g Oral TID WC & HS   Continuous Infusions: .  ceFAZolin (ANCEF) IV 2 g (12/23/17 1443)     LOS: 9 days    Time spent: 35 minutes , case discussed with oncology Dr [name] over the phone , RN, patient and wife at bedside Greater than 50% of the time spent on counseling and coordinating the care.   Florencia Reasons, MD PhD [facility] Pager [phone]  If 7PM-7AM, please contact night-coverage [email or web address] Password [email or web address] 12/23/2017, 3:52 PM

## 2017-12-23 NOTE — Progress Notes (Signed)
PHARMACY CONSULT NOTE FOR:  OUTPATIENT  PARENTERAL ANTIBIOTIC THERAPY (OPAT)  Indication: MSSA bacteremia Regimen: Ancef 2g IV q8 hr End date: 01/01/2018  IV antibiotic discharge orders are pended. To discharging provider:  please sign these orders via discharge navigator,  Select New Orders & click on the button choice - Manage This Unsigned Work.     Thank you for allowing pharmacy to be a part of this patient's care.  Reuel Boom, PharmD, BCPS [phone] 12/23/2017, 8:29 AM

## 2017-12-23 NOTE — Care Management Important Message (Signed)
Important Message  Patient Details  Name: Nicholas Hale MRN: [id number] Date of Birth: [birth year]   Medicare Important Message Given:  Yes    Kerin Salen 12/23/2017, 11:51 AMImportant Message  Patient Details  Name: Nicholas Hale MRN: [id number] Date of Birth: 02-Oct-1946   Medicare Important Message Given:  Yes    Kerin Salen 12/23/2017, 11:51 AM

## 2017-12-23 NOTE — Progress Notes (Signed)
Picc nurse called requesting picc line be changed to midline.  Due to 2 weeks antibiotics, and antibiotic, patient would be appropriate for a midline.  Dr. Erlinda Hong notified, of Picc nurse's request.  However, when discussed with patient, patient requested a picc line, due to future chemotherapy/labs.

## 2017-12-24 DIAGNOSIS — A419 Sepsis, unspecified organism: Secondary | ICD-10-CM

## 2017-12-24 LAB — BASIC METABOLIC PANEL
Anion gap: 9 (ref 5–15)
BUN: 14 mg/dL (ref 6–20)
CALCIUM: 8.2 mg/dL — AB (ref 8.9–10.3)
CHLORIDE: 101 mmol/L (ref 101–111)
CO2: 29 mmol/L (ref 22–32)
CREATININE: 0.84 mg/dL (ref 0.61–1.24)
GFR calc Af Amer: >60 mL/min (ref >60)
GFR calc non Af Amer: >60 mL/min (ref >60)
Glucose, Bld: 103 mg/dL — ABNORMAL HIGH (ref 65–99)
Potassium: 4.1 mmol/L (ref 3.5–5.1)
SODIUM: 139 mmol/L (ref 135–145)

## 2017-12-24 LAB — CBC WITH DIFFERENTIAL/PLATELET
BASOS PCT: 0 %
Basophils Absolute: 0 10*3/uL (ref 0.0–0.1)
EOS ABS: 0.1 10*3/uL (ref 0.0–0.7)
Eosinophils Relative: 1 %
HEMATOCRIT: 34.7 % — AB (ref 39.0–52.0)
HEMOGLOBIN: 10.7 g/dL — AB (ref 13.0–17.0)
LYMPHS ABS: 1.2 10*3/uL (ref 0.7–4.0)
Lymphocytes Relative: 12 %
MCH: 32.5 pg (ref 26.0–34.0)
MCHC: 30.8 g/dL (ref 30.0–36.0)
MCV: 105.5 fL — ABNORMAL HIGH (ref 78.0–100.0)
MONO ABS: 1.1 10*3/uL — AB (ref 0.1–1.0)
MONOS PCT: 11 %
NEUTROS PCT: 76 %
Neutro Abs: 8.1 10*3/uL — ABNORMAL HIGH (ref 1.7–7.7)
Platelets: 101 10*3/uL — ABNORMAL LOW (ref 150–400)
RBC: 3.29 MIL/uL — ABNORMAL LOW (ref 4.22–5.81)
RDW: 18 % — AB (ref 11.5–15.5)
WBC: 10.6 10*3/uL — ABNORMAL HIGH (ref 4.0–10.5)

## 2017-12-24 LAB — HEPATIC FUNCTION PANEL
ALBUMIN: 2.1 g/dL — AB (ref 3.5–5.0)
ALK PHOS: 89 U/L (ref 38–126)
ALT: 6 U/L — AB (ref 17–63)
AST: 37 U/L (ref 15–41)
BILIRUBIN TOTAL: 0.3 mg/dL (ref 0.3–1.2)
Bilirubin, Direct: <0.1 mg/dL — ABNORMAL LOW (ref 0.1–0.5)
TOTAL PROTEIN: 6.5 g/dL (ref 6.5–8.1)

## 2017-12-24 LAB — T4, FREE: Free T4: 0.75 ng/dL (ref 0.61–1.12)

## 2017-12-24 LAB — VITAMIN B12: Vitamin B-12: 995 pg/mL — ABNORMAL HIGH (ref 180–914)

## 2017-12-24 LAB — TSH: TSH: 17.172 u[IU]/mL — ABNORMAL HIGH (ref 0.350–4.500)

## 2017-12-24 LAB — FOLATE: Folate: 12.3 ng/mL (ref >5.9)

## 2017-12-24 MED ORDER — HEPARIN SOD (PORK) LOCK FLUSH 100 UNIT/ML IV SOLN
250.0000 [IU] | INTRAVENOUS | Status: AC | PRN
  Administered 2017-12-24: 250 [IU]

## 2017-12-24 MED ORDER — SODIUM CHLORIDE 0.9% FLUSH: 10.0000 mL | INTRAVENOUS | Status: DC | PRN

## 2017-12-24 MED ORDER — POLYETHYLENE GLYCOL 3350 17 G PO PACK: 17.0000 g | PACK | Freq: Every day | ORAL | 0 refills | Status: DC

## 2017-12-24 MED ORDER — SENNOSIDES-DOCUSATE SODIUM 8.6-50 MG PO TABS: 1.0000 | ORAL_TABLET | Freq: Every day | ORAL | 0 refills | Status: DC

## 2017-12-24 MED ORDER — DIPHENHYDRAMINE HCL 25 MG PO TABS: 25.0000 mg | ORAL_TABLET | Freq: Every evening | ORAL | 0 refills | Status: DC | PRN

## 2017-12-24 NOTE — Discharge Summary (Signed)
Discharge Summary  Nicholas Hale MOL:[id number] DOB: 10-07-1946  PCP: [name], MD  Admit date: 12/14/2017 Discharge date: 12/24/2017  Time spent: >13mns, more than 50% time spent on coordination of care.  Recommendations for Outpatient Follow-up:  1. F/u with PMD within a week  for hospital discharge follow up, repeat cbc/bmp at follow up. pmd to repeat tsh in 4-6 weeks. 2. F/u with oncology Dr MJulien Nordmann  Discharge Diagnoses:  Active Hospital Problems   Diagnosis Date Noted  . Pneumonia 12/14/2017  . 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 Staphylococcus aureus bacteremia   . Bacteremia   . Malnutrition of moderate degree 12/17/2017  . Leukocytosis 12/14/2017  . Anemia 12/14/2017  . Hyponatremia 12/14/2017    Resolved Hospital Problems  No resolved problems to display.    Discharge Condition: stable  Diet recommendation: regular diet  Filed Weights   12/15/17 1632 12/20/17 0458 12/21/17 0827  Weight: 84.4 kg (186 lb) 86.7 kg (191 lb 2.2 oz) 86.7 kg (191 lb 2.2 oz)    History of present illness:  Nicholas Hale is a 72y.o. male, hypertension, hyperlipidemia, h/o PE/DVT, lung cancer (stage 4, nonsmall cell ) , pericardial effusion (malignant), apparently c/o increasing dyspnea over the past several days as well as cough with productive sputum.  Pt denies fever, chills, cp, palp, n/v, diarrhea, brbpr.  Pt presented to ED due to dyspnea.   In ED,  Wbc 24.1, hgb 12.9, Plt 103 Bun 27, Creatinine 1.17 Trop <0.03 Glucose 142  CT scan chest=> IMPRESSION: 1. Suboptimal exam limiting assessment for smaller pulmonary emboli. Allowing for this, there is no evidence of a pulmonary embolus. 2. Extensive left lower lobe consolidation consistent with pneumonia. 3. Findings of metastatic carcinoma noted on the prior chest, abdomen and pelvis CT are similar.  Pt will be admitted for pneumonia Hcap    Hospital Course:  Principal Problem:   Pneumonia Active  Problems:   Leukocytosis   Anemia   Hyponatremia   Malnutrition of moderate degree   Bacteremia   Infection due to portacath, sequela   Staphylococcus aureus bacteremia   Sepsis secondary to left lower lobe pneumonia, MSSA bacteremia  - Please note that patient met criteria for sepsis with HR > 90, WBC 24, source left lower lobe pneumonia, MSSA bacteremia suspected from portacath, Right chest portremoved 1/18 -  TEE 1/22 -  EF 60%. Normal valves. No vegetations or SBE. No effusion. -pt was started initially on vancomycin and cefepime, ID team has been consulted and ABX changed to Cefazolin 2gm IV Q8 hours,  Per ID, pt needs cefazolin to finish through 01/01/2018 -- Blood cx repeated and so far showed no growth -place picc line,  d/c home on iv abx (mid line would suffice outpatient abx, however patient request picc line being placed due to poor iv access and needing outpatient chemo, case discussed with Dr MJulien Nordmannwho agreed with picc line placement)   Mucositis - Continue magic mouthwash - Improving  Epistaxis - Nose bleed with blood clots 1/20 - Afrin spray ordered - Consulted ENT but bleeding stopped shortly thereafter so no other intervention was required - Xarelto on hold, will resume today at lowest possible dose -resume full dose xarelto at discharge ,no further nose bleed.  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 [name]  History of PE and DVT - Resume xarelto at lowest does possible and if bleeding starts than stop the xarelto  -full dose xarelto resumed at discharge, no more bleed  Thrombocytopenia  -plt 72 on admission - Likely chemotherapy-induced - Improved since admission , plt 101 at discharge.  Transaminitis - Mild elevation in LFT's, from sepsis? - lft normalized - Statin was on hold since admission , resumed at discharge,Outpt follow up  Constipation: stool softener  DVT prophylaxis: Xarelto  Code Status: full code  Family Communication: wife at bedside  Disposition Plan: picc line placement then d/c on 1/25   Consultants:   ID Dr Baxter Flattery  Cardio for TEE  ENT 1/20  IR for chemo Port removal  Procedures:   TEE 1/22  Right chest chemoport  removed 12/17/2017  picc placement planned on 1/25  Antimicrobials:   Vancomycin 12/14/2017 -->01/17   Cefepime 12/14/2017 -->01/17   Cefazolin 01/16 --> 2/2   Discharge Exam: BP 132/72 (BP Location: Right Arm)   Pulse 84   Temp 97.7 F (36.5 C) (Oral)   Resp 20   Ht _0  (1.778 m)   Wt 86.7 kg (191 lb 2.2 oz)   SpO2 92%   BMI 27.43 kg/m   General: * Cardiovascular: * Respiratory: *  Discharge Instructions You were cared for by a hospitalist during your hospital stay. If you have any questions about your discharge medications or the care you received while you were in the hospital after you are discharged, you can call the unit and asked to speak with the hospitalist on call if the hospitalist that took care of you is not available. Once you are discharged, your primary care physician will handle any further medical issues. Please note that NO REFILLS for any discharge medications will be  authorized once you are discharged, as it is imperative that you return to your primary care physician (or establish a relationship with a primary care physician if you do not have one) for your aftercare needs so that they can reassess your need for medications and monitor your lab values.  Discharge Instructions    Diet general   Complete by:  As directed    Home infusion instructions [facility] May follow [place] Dosing Protocol; May administer Cathflo as needed to maintain patency of vascular access device.; Flushing of vascular access device: per [facility] Protocol: 0.9% NaCl pre/post medica...   Complete by:  As directed    Instructions:  May follow [place] Dosing Protocol   Instructions:  May administer Cathflo as needed to maintain patency of vascular access device.   Instructions:  Flushing of vascular access device: per [facility] Protocol: 0.9% NaCl pre/post medication administration and prn patency; Heparin 100 u/ml, 105m for implanted ports and Heparin 10u/ml, 560mfor all other central venous catheters.   Instructions:  May follow [facility] Anaphylaxis Protocol for First Dose Administration in the home: 0.9% NaCl at 25-50 ml/hr to maintain IV access  for protocol meds. Epinephrine 0.3 ml IV/IM PRN and Benadryl 25-50 IV/IM PRN s/s of anaphylaxis.   Instructions:  [facility] Infusion Coordinator (RN) to assist per patient IV care needs in the home PRN.   Increase activity slowly   Complete by:  As directed      Allergies as of 12/24/2017      Reactions   Lidocaine Swelling   "Hard white blisters" formed on tongue, swollen mouth      Medication List    STOP taking these medications   chlorpheniramine-HYDROcodone 10-8 MG/5ML Suer Commonly known as:  TUSSIONEX   oxyCODONE-acetaminophen 5-325 MG tablet Commonly known as:  PERCOCET/ROXICET     TAKE these medications   albuterol 108 (90 Base) MCG/ACT inhaler Commonly known as:  PROVENTIL HFA;VENTOLIN HFA Inhale 2 puffs  into the lungs every 4 (four) hours as needed for wheezing or shortness of breath.   atorvastatin 20 MG tablet Commonly known as:  LIPITOR TAKE 1 TABLET BY MOUTH EVERY DAY AT 6PM   ceFAZolin IVPB Commonly known as:  ANCEF Inject 2 g into the vein every 8 (eight) hours for 9 days. Indication:  MSSA bacteremia Last Day of Therapy:  01/01/2018 Labs - Once weekly:  CBC/D and BMP, Labs - Every other week:  ESR and CRP   dexamethasone 4 MG tablet Commonly known as:  DECADRON 4 mg by mouth twice a day the day before, day of and day after the chemotherapy every 3 weeks.   diphenhydrAMINE 25 MG tablet Commonly known as:  BENADRYL Take 1 tablet (25 mg total) by mouth at bedtime as needed for sleep. What changed:  how much to take   lidocaine 2 % solution Commonly known as:  XYLOCAINE Use as directed 5 mLs in the mouth or throat every 3 (three) hours as needed for mouth pain.   lidocaine-prilocaine cream Commonly known as:  EMLA Apply 1 application topically as needed (on port site).   magic mouthwash Soln Take 5 mLs by mouth 4 (four) times daily. Prednisone 80 mg , Nystatin 30 ml, Benadryl 12.5 mg/5 ml. Mix to 240 ml, 1:1:1 concentration.   MUCINEX DM PO Take 20 mLs by mouth 2 (two) times daily as needed (CHEST CONGESTION).   oxyCODONE 5 MG/5ML solution Commonly known as:  ROXICODONE Take 5 mLs (5 mg total) by mouth every 4 (four) hours as needed for severe pain.   polyethylene glycol packet Commonly known as:  MIRALAX / GLYCOLAX Take 17 g by mouth daily. Start taking on:  12/25/2017   prochlorperazine 10 MG tablet Commonly known as:  COMPAZINE Take 1 tablet (10 mg total) by mouth every 6 (six) hours as needed for nausea or vomiting.   senna-docusate 8.6-50 MG tablet Commonly known as:  Senokot-S Take 1 tablet by mouth at bedtime.   sodium chloride 0.65 % Soln nasal spray Commonly known as:  OCEAN Place 2 sprays into both nostrils as needed for congestion.   sucralfate 1 g  tablet Commonly known as:  CARAFATE Take 1 tablet (1 g total) by mouth 4 (four) times daily -  with meals and at bedtime. Crush and dissolve in 10 cc of water prior to swallowing   XARELTO 20 MG Tabs tablet Generic drug:  rivaroxaban TAKE 1 TABLET BY MOUTH EVERY DAY            Home Infusion Instuctions  (From admission, onward)        Start     Ordered   12/23/17  0000  Home infusion instructions [facility] May follow [place] Dosing Protocol; May administer Cathflo as needed to maintain patency of vascular access device.; Flushing of vascular access device: per [facility] Protocol: 0.9% NaCl pre/post medica...    Question Answer Comment  Instructions May follow [place] Dosing Protocol   Instructions May administer Cathflo as needed to maintain patency of vascular access device.   Instructions Flushing of vascular access device: per [facility] Protocol: 0.9% NaCl pre/post medication administration and prn patency; Heparin 100 u/ml, 71m for implanted ports and Heparin 10u/ml, 576mfor all other central venous catheters.   Instructions May follow [facility] Anaphylaxis Protocol for First Dose Administration in the home: 0.9% NaCl at 25-50 ml/hr to maintain IV access for protocol meds. Epinephrine 0.3 ml IV/IM PRN and Benadryl 25-50 IV/IM PRN s/s of anaphylaxis.   Instructions [facility] Infusion Coordinator (RN) to assist per patient IV care needs in the home PRN.      12/23/17 1024     Allergies  Allergen Reactions  . Lidocaine Swelling    "Hard white blisters" formed on tongue, swollen mouth   Follow-up Information    Rankins, ViBill SalinasMD Follow up in 1 week(s).   Specialty:  Family Medicine Why:  hospital discharge follow up Contact information: [address] [id number] [phone]          The results of significant diagnostics from this hospitalization (including imaging, microbiology, ancillary and laboratory) are listed below for  reference.    Significant Diagnostic Studies: Dg Chest 2 View  Result Date: 12/14/2017 CLINICAL DATA:  Cough and shortness of breath. EXAM: CHEST  2 VIEW COMPARISON:  CT chest dated November 24, 2017. Chest x-ray dated August 21, 2017. FINDINGS: Unchanged right chest wall port catheter with the tip in the mid SVC. The heart size and mediastinal contours are within normal limits. Normal pulmonary vascularity. Left basilar atelectasis. No focal consolidation, pleural effusion, or pneumothorax. No acute osseous abnormality. IMPRESSION: Left basilar atelectasis.  No active cardiopulmonary disease. Electronically Signed   By: WiTitus Dubin.D.   On: 12/14/2017 17:01   Dg Abd 1 View  Result Date: 12/09/2017 CLINICAL DATA:  Abdominal distention.  Constipation. EXAM: ABDOMEN - 1 VIEW COMPARISON:  CT abdomen and pelvis November 24, 2017 FINDINGS: There is moderate stool in the colon. There is no appreciable bowel dilatation or air-fluid level to suggest bowel obstruction. No free air. There are soft tissue masses in the right mid abdomen which CT demonstrates as representing large right renal cysts. Lung bases are clear. There are small phleboliths in the pelvis. 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 IMPRESSION: No bowel obstruction or free air. Moderate stool in colon. Soft tissue masses right mid abdomen demonstrated by CT to represent large renal cysts. Total hip replacements bilaterally.  Lung bases clear. Electronically Signed   By: WiLowella GripII M.D.   On: 12/09/2017 14:05   Ct Angio Chest Pe W/cm &/or Wo Cm  Result Date: 12/14/2017 CLINICAL DATA:  Weakness over the past few days. States he had chemo 3 weeks ago and since then he has lost 15 lbs. Has reportedly had multiple reactions to the chemo. Stage 4 ca EXAM: CT ANGIOGRAPHY CHEST WITH CONTRAST TECHNIQUE: Multidetector CT imaging of the chest was performed using the standard protocol during bolus administration of intravenous  contrast. Multiplanar CT image reconstructions and MIPs were obtained to evaluate the vascular anatomy. CONTRAST:  [id number] IOPAMIDOL (ISOVUE-370)  INJECTION 76% COMPARISON:  CT chest, abdomen and pelvis, 11/24/2017. Current chest radiograph. FINDINGS: Cardiovascular: Contrast opacification of the pulmonary arteries is suboptimal, particularly to the mid to lower lung segmental and subsegmental vessels. This limits assessment for pulmonary emboli. Allowing for this, there is no evidence of a central pulmonary embolus. Heart is normal in size and configuration. No pericardial effusion. There are three-vessel coronary artery calcifications. The thoracic aorta is normal in caliber. No dissection. Mild atherosclerosis is noted along the aortic arch and descending thoracic aorta. Mediastinum/Nodes: Several prominent lymph nodes. There is a left paratracheal superior mediastinal node measuring 8 mm in short axis. There are anterior mediastinal lymph nodes, largest measuring 1.6 x 1.0 cm, lying adjacent to the left brachiocephalic vein. Allowing for differences in measurement technique, these are stable from the prior CT. There is increased soft tissue along the left hilar structures that is also stable. Trachea is widely patent. Esophagus is unremarkable. Lungs/Pleura: There is consolidation in the posteromedial left lower lobe. The dense consolidation is surrounded by hazy ground-glass opacity. There is bronchial wall thickening of the lower lobe segmental bronchi. There is a smaller area of consolidation in the posterolateral left upper lobe centered on image 81, series 10. There small nodular components to this. There is a spiculated nodule in the subpleural left lower lobe measuring 10 x 8 cm, which was present previously. There are no other discrete lung nodules. There are no other areas of consolidation. There are stable areas of peripheral interstitial thickening/scarring as well as upper lobe predominant  emphysema. Mild subsegmental atelectasis is noted in the right lower lobe. No pleural effusion.  No pneumothorax. Upper Abdomen: No acute findings. Musculoskeletal: No fracture or acute finding. No osteoblastic or osteolytic lesions. Review of the MIP images confirms the above findings. IMPRESSION: 1. Suboptimal exam limiting assessment for smaller pulmonary emboli. Allowing for this, there is no evidence of a pulmonary embolus. 2. Extensive left lower lobe consolidation consistent with pneumonia. 3. Findings of metastatic carcinoma noted on the prior chest, abdomen and pelvis CT are similar. Aortic Atherosclerosis (ICD10-I70.0) and Emphysema (ICD10-J43.9). Electronically Signed   By: Lajean Manes M.D.   On: 12/14/2017 19:56   Ir Removal [employer] W/ [place] W/o Fl Mod Sed  Result Date: 12/17/2017 INDICATION: 72 year old with pneumonia and bacteremia. Request for removal of the right jugular Port-A-Cath. Port-A-Cath was placed on 02/01/2017 by Dr. Vernard Gambles. EXAM: REMOVAL RIGHT IJ VEIN PORT-A-CATH MEDICATIONS: Ancef 2 g; The antibiotic was administered within an appropriate time interval prior to skin puncture. ANESTHESIA/SEDATION: Moderate (conscious) sedation was employed during this procedure. A total of Versed 1.0 mg and Fentanyl 50 mcg was administered intravenously. Moderate Sedation Time: 23 minutes. The patient's level of consciousness and vital signs were monitored continuously by radiology nursing throughout the procedure under my direct supervision. FLUOROSCOPY TIME:  None COMPLICATIONS: None immediate. PROCEDURE: Informed written consent was obtained from the patient after a thorough discussion of the procedural risks, benefits and alternatives. All questions were addressed. Maximal Sterile Barrier Technique was utilized including caps, mask, sterile gowns, sterile gloves, sterile drape, hand hygiene and skin antiseptic. A timeout was performed prior to the initiation of the procedure. The right chest  was prepped and draped in a sterile fashion. 1% Nesacaine was utilized for local anesthesia because of patient's lidocaine allergy. An incision was made over the previously healed surgical incision. Utilizing blunt dissection, the port catheter and reservoir were removed from the underlying subcutaneous tissue in their entirety. Port pocket was healthy  looking. The pocket was irrigated with a copious amount of sterile normal saline. The pocket was closed with interrupted 3-0 Vicryl stitches. A 4-0 Vicryl running subcuticular stitch was utilized to approximate the skin. Dermabond was applied. IMPRESSION: Successful right IJ vein Port-A-Cath explant. Electronically Signed   By: Markus Daft M.D.   On: 12/17/2017 15:49    Microbiology: Recent Results (from the past 240 hour(s))  Culture, blood (routine x 2)     Status: Abnormal   Collection Time: 12/14/17  4:45 PM  Result Value Ref Range Status   Specimen Description BLOOD PORTA CATH [facility]  Final   Special Requests   Final    BOTTLES DRAWN AEROBIC AND ANAEROBIC Blood Culture adequate volume   Culture  Setup Time   Final    GRAM POSITIVE COCCI AEROBIC BOTTLE ONLY CRITICAL RESULT CALLED TO, READ BACK BY AND VERIFIED WITH: E JACKSON 12/15/17 137P JW Performed at [facility], [address], [place], [place] [address]    Culture STAPHYLOCOCCUS AUREUS (A)  Final   Report Status 12/17/2017 FINAL  Final   Organism ID, Bacteria STAPHYLOCOCCUS AUREUS  Final      Susceptibility   Staphylococcus aureus - MIC*    CIPROFLOXACIN <=0.5 SENSITIVE Sensitive     ERYTHROMYCIN >=8 RESISTANT Resistant     GENTAMICIN <=0.5 SENSITIVE Sensitive     OXACILLIN 0.5 SENSITIVE Sensitive     TETRACYCLINE <=1 SENSITIVE Sensitive     VANCOMYCIN <=0.5 SENSITIVE Sensitive     TRIMETH/SULFA <=10 SENSITIVE Sensitive     CLINDAMYCIN <=0.25 SENSITIVE Sensitive     RIFAMPIN <=0.5 SENSITIVE Sensitive     Inducible Clindamycin NEGATIVE Sensitive     * STAPHYLOCOCCUS AUREUS    Blood Culture ID Panel (Reflexed)     Status: Abnormal   Collection Time: 12/14/17  4:45 PM  Result Value Ref Range Status   Enterococcus species NOT DETECTED NOT DETECTED Final   Listeria monocytogenes NOT DETECTED NOT DETECTED Final   Staphylococcus species DETECTED (A) NOT DETECTED Final    Comment: CRITICAL RESULT CALLED TO, READ BACK BY AND VERIFIED WITH: E JACKSON 12/15/17 137P JW    Staphylococcus aureus DETECTED (A) NOT DETECTED Final    Comment: Methicillin (oxacillin) susceptible Staphylococcus aureus (MSSA). Preferred therapy is anti staphylococcal beta lactam antibiotic (Cefazolin or Nafcillin), unless clinically contraindicated. CRITICAL RESULT CALLED TO, READ BACK BY AND VERIFIED WITH: E JACKSON 12/15/17 137P JW    Methicillin resistance NOT DETECTED NOT DETECTED Final   Streptococcus species NOT DETECTED NOT DETECTED Final   Streptococcus agalactiae NOT DETECTED NOT DETECTED Final   Streptococcus pneumoniae NOT DETECTED NOT DETECTED Final   Streptococcus pyogenes NOT DETECTED NOT DETECTED Final   Acinetobacter baumannii NOT DETECTED NOT DETECTED Final   Enterobacteriaceae species NOT DETECTED NOT DETECTED Final   Enterobacter cloacae complex NOT DETECTED NOT DETECTED Final   Escherichia coli NOT DETECTED NOT DETECTED Final   Klebsiella oxytoca NOT DETECTED NOT DETECTED Final   Klebsiella pneumoniae NOT DETECTED NOT DETECTED Final   Proteus species NOT DETECTED NOT DETECTED Final   Serratia marcescens NOT DETECTED NOT DETECTED Final   Haemophilus influenzae NOT DETECTED NOT DETECTED Final   Neisseria meningitidis NOT DETECTED NOT DETECTED Final   Pseudomonas aeruginosa NOT DETECTED NOT DETECTED Final   Candida albicans NOT DETECTED NOT DETECTED Final   Candida glabrata NOT DETECTED NOT DETECTED Final   Candida krusei NOT DETECTED NOT DETECTED Final   Candida parapsilosis NOT DETECTED NOT DETECTED Final  Candida tropicalis NOT DETECTED NOT DETECTED Final    Comment:  Performed at [facility], [place] [address] [place], [place] [address]  Culture, blood (routine x 2)     Status: None   Collection Time: 12/14/17  9:45 PM  Result Value Ref Range Status   Specimen Description BLOOD RIGHT ANTECUBITAL  Final   Special Requests   Final    BOTTLES DRAWN AEROBIC AND ANAEROBIC Blood Culture adequate volume   Culture   Final    NO GROWTH 5 DAYS Performed at [facility], [place] [address] [place], [place] [address]    Report Status 12/20/2017 FINAL  Final  Culture, sputum-assessment     Status: None   Collection Time: 12/15/17  5:17 PM  Result Value Ref Range Status   Specimen Description EXPECTORATED SPUTUM  Final   Special Requests NONE  Final   Sputum evaluation THIS SPECIMEN IS ACCEPTABLE FOR SPUTUM CULTURE  Final   Report Status 12/15/2017 FINAL  Final  Culture, respiratory (NON-Expectorated)     Status: None   Collection Time: 12/15/17  5:17 PM  Result Value Ref Range Status   Specimen Description EXPECTORATED SPUTUM  Final   Special Requests NONE Reflexed from [id number]  Final   Gram Stain   Final    ABUNDANT WBC PRESENT, PREDOMINANTLY PMN ABUNDANT GRAM NEGATIVE RODS FEW GRAM POSITIVE COCCI    Culture   Final    Consistent with normal respiratory flora. Performed at [facility], [place] [address] [place], [place] [address]    Report Status 12/17/2017 FINAL  Final  Culture, blood (routine x 2)     Status: None   Collection Time: 12/17/17  4:10 PM  Result Value Ref Range Status   Specimen Description BLOOD RIGHT HAND  Final   Special Requests   Final    BOTTLES DRAWN AEROBIC AND ANAEROBIC Blood Culture adequate volume   Culture   Final    NO GROWTH 5 DAYS Performed at [facility], [place] [address] [place], [place] [address]    Report Status 12/22/2017 FINAL  Final  Culture, blood (routine x 2)     Status: None   Collection Time: 12/17/17  4:20 PM  Result Value Ref Range Status   Specimen Description BLOOD RIGHT  ANTECUBITAL  Final   Special Requests   Final    BOTTLES DRAWN AEROBIC AND ANAEROBIC Blood Culture adequate volume   Culture   Final    NO GROWTH 5 DAYS Performed at [facility], [place] [address] [place], [place] [address]    Report Status 12/22/2017 FINAL  Final     Labs: Basic Metabolic Panel: Recent Labs  Lab 12/20/17 0546 12/21/17 0614 12/22/17 0619 12/23/17 0545 12/24/17 0618  NA 139 139 139 139 139  K 3.8 3.7 3.9 4.0 4.1  CL 104 104 102 101 101  CO2 _0 GLUCOSE 111* 101* 111* 127* 103*  BUN _1 CREATININE 0.89 0.81 0.92 0.94 0.84  CALCIUM 8.3* 8.0* 8.0* 8.0* 8.2*   Liver Function Tests: Recent Labs  Lab 12/24/17 0618  AST 37  ALT 6*  ALKPHOS 89  BILITOT 0.3  PROT 6.5  ALBUMIN 2.1*   No results for input(s): LIPASE, AMYLASE in the last 168 hours. No results for input(s): AMMONIA in the last 168 hours. CBC: Recent Labs  Lab 12/20/17 0546 12/20/17 1359 12/22/17 1740 12/23/17 0545 12/24/17 8144  WBC 12.5* 10.5 12.8* 11.3* 10.6*  NEUTROABS  --   --   --   --  8.1*  HGB 11.1* 10.8* 11.5* 10.8* 10.7*  HCT 36.0* 34.8* 37.2* 35.0* 34.7*  MCV 105.0* 105.8* 106.3* 106.1* 105.5*  PLT 100* 112* 116* 103* 101*   Cardiac Enzymes: No results for input(s): CKTOTAL, CKMB, CKMBINDEX, TROPONINI in the last 168 hours. BNP: BNP (last 3 results) No results for input(s): BNP in the last 8760 hours.  ProBNP (last 3 results) No results for input(s): PROBNP in the last 8760 hours.  CBG: No results for input(s): GLUCAP in the last 168 hours.     Signed:  Florencia Reasons MD, PhD  [facility] 12/24/2017, 10:53 AM

## 2017-12-24 NOTE — Progress Notes (Signed)
Peripherally Inserted Central Catheter/Midline Placement  The IV Nurse has discussed with the patient and/or persons authorized to consent for the patient, the purpose of this procedure and the potential benefits and risks involved with this procedure.  The benefits include less needle sticks, lab draws from the catheter, and the patient may be discharged home with the catheter. Risks include, but not limited to, infection, bleeding, blood clot (thrombus formation), and puncture of an artery; nerve damage and irregular heartbeat and possibility to perform a PICC exchange if needed/ordered by physician.  Alternatives to this procedure were also discussed.  Bard Power PICC patient education guide, fact sheet on infection prevention and patient information card has been provided to patient /or left at bedside.    PICC/Midline Placement Documentation  PICC Single Lumen 92/44/62 PICC Right Basilic 39 cm 3 cm (Active)  Indication for Insertion or Continuance of Line Prolonged intravenous therapies 12/24/2017  1:00 PM  Exposed Catheter (cm) 3 cm 12/24/2017  1:00 PM  Dressing Change Due 12/31/17 12/24/2017  1:00 PM       [employer] 12/24/2017, 1:12 PM

## 2017-12-24 NOTE — Progress Notes (Signed)
Pts IV removed after completing the 1400 antibiotic. Pt denies pain at the time of d/c with no s/s of distress noted. Pt taken to the front entrance with nursing staff and family present.

## 2017-12-24 NOTE — Progress Notes (Addendum)
Date: December 24, 2017 Chart review for discharge needs: Patient to go home with iv abx through [facility] Patient has no questions concerning post hospital care. Velva Harman, BSN, [place], [place]

## 2017-12-25 DIAGNOSIS — I313 Pericardial effusion (noninflammatory): Secondary | ICD-10-CM | POA: Diagnosis not present

## 2017-12-25 DIAGNOSIS — C3432 Malignant neoplasm of lower lobe, left bronchus or lung: Secondary | ICD-10-CM | POA: Diagnosis not present

## 2017-12-25 DIAGNOSIS — E440 Moderate protein-calorie malnutrition: Secondary | ICD-10-CM | POA: Diagnosis not present

## 2017-12-25 DIAGNOSIS — Z7901 Long term (current) use of anticoagulants: Secondary | ICD-10-CM | POA: Diagnosis not present

## 2017-12-25 DIAGNOSIS — Z452 Encounter for adjustment and management of vascular access device: Secondary | ICD-10-CM | POA: Diagnosis not present

## 2017-12-25 DIAGNOSIS — E785 Hyperlipidemia, unspecified: Secondary | ICD-10-CM | POA: Diagnosis not present

## 2017-12-25 DIAGNOSIS — D696 Thrombocytopenia, unspecified: Secondary | ICD-10-CM | POA: Diagnosis not present

## 2017-12-25 DIAGNOSIS — Z86711 Personal history of pulmonary embolism: Secondary | ICD-10-CM | POA: Diagnosis not present

## 2017-12-25 DIAGNOSIS — Z79891 Long term (current) use of opiate analgesic: Secondary | ICD-10-CM | POA: Diagnosis not present

## 2017-12-25 DIAGNOSIS — T827XXA Infection and inflammatory reaction due to other cardiac and vascular devices, implants and grafts, initial encounter: Secondary | ICD-10-CM | POA: Diagnosis not present

## 2017-12-25 DIAGNOSIS — Z5181 Encounter for therapeutic drug level monitoring: Secondary | ICD-10-CM | POA: Diagnosis not present

## 2017-12-25 DIAGNOSIS — Z7951 Long term (current) use of inhaled steroids: Secondary | ICD-10-CM | POA: Diagnosis not present

## 2017-12-25 DIAGNOSIS — C770 Secondary and unspecified malignant neoplasm of lymph nodes of head, face and neck: Secondary | ICD-10-CM | POA: Diagnosis not present

## 2017-12-25 DIAGNOSIS — Z87891 Personal history of nicotine dependence: Secondary | ICD-10-CM | POA: Diagnosis not present

## 2017-12-25 DIAGNOSIS — Z86718 Personal history of other venous thrombosis and embolism: Secondary | ICD-10-CM | POA: Diagnosis not present

## 2017-12-25 DIAGNOSIS — J189 Pneumonia, unspecified organism: Secondary | ICD-10-CM | POA: Diagnosis not present

## 2017-12-25 DIAGNOSIS — K219 Gastro-esophageal reflux disease without esophagitis: Secondary | ICD-10-CM | POA: Diagnosis not present

## 2017-12-25 DIAGNOSIS — D649 Anemia, unspecified: Secondary | ICD-10-CM | POA: Diagnosis not present

## 2017-12-25 DIAGNOSIS — I10 Essential (primary) hypertension: Secondary | ICD-10-CM | POA: Diagnosis not present

## 2017-12-27 ENCOUNTER — Telehealth: Payer: Self-pay | Admitting: Emergency Medicine

## 2017-12-27 ENCOUNTER — Telehealth: Payer: Self-pay | Admitting: Medical Oncology

## 2017-12-27 DIAGNOSIS — C770 Secondary and unspecified malignant neoplasm of lymph nodes of head, face and neck: Secondary | ICD-10-CM | POA: Diagnosis not present

## 2017-12-27 DIAGNOSIS — J189 Pneumonia, unspecified organism: Secondary | ICD-10-CM | POA: Diagnosis not present

## 2017-12-27 DIAGNOSIS — D696 Thrombocytopenia, unspecified: Secondary | ICD-10-CM | POA: Diagnosis not present

## 2017-12-27 DIAGNOSIS — I313 Pericardial effusion (noninflammatory): Secondary | ICD-10-CM | POA: Diagnosis not present

## 2017-12-27 DIAGNOSIS — C3432 Malignant neoplasm of lower lobe, left bronchus or lung: Secondary | ICD-10-CM | POA: Diagnosis not present

## 2017-12-27 DIAGNOSIS — C3492 Malignant neoplasm of unspecified part of left bronchus or lung: Secondary | ICD-10-CM | POA: Diagnosis not present

## 2017-12-27 DIAGNOSIS — T827XXA Infection and inflammatory reaction due to other cardiac and vascular devices, implants and grafts, initial encounter: Secondary | ICD-10-CM | POA: Diagnosis not present

## 2017-12-27 NOTE — Telephone Encounter (Signed)
Pt notified. Schedule request sent.

## 2017-12-27 NOTE — Telephone Encounter (Signed)
Per pt's chart, refills of Xarelto were sent to [facility] in December 2018 [place] and spoke with Lattie Haw - pt has not yet used any of the refills on this Rx Asked Lattie Haw to please go ahead and get a refill ready for patient  Called spoke with patent's spouse Enid Derry and informed her of the above.  Enid Derry voiced her understanding.  She would also like RB to be aware that patient was hospitalized from 1.15.19 - 1.25.19 for HCAP.  Pt is not scheduled to come in to the office until 2.25.19 and Shirley's reasoning for this is because she feels patient is too weak to come any earlier and this was the time for pt's regular 6 month follow up.  Patient's appt is 15 mins, rather than 30 for HFU.    Enid Derry stated she just wanted RB to be aware that pt was hospitalized "in case he would like to look at his chart to see what was going on."  Advised Enid Derry we would contact her if RB has any additional recommendations RB please advise, thank you

## 2017-12-27 NOTE — Telephone Encounter (Signed)
On IV antibiotics at home  until feb 3rd, Weekly labs this week and next week per ID. Nurse will make sure CBC/diff and cmp drawn and faxed to [facility]. Wife does not think he will be ready to start chemo on feb 7th. I told her to keep pt appt for feb 7th.

## 2017-12-27 NOTE — Telephone Encounter (Signed)
Delay his treatment and visit until 2/14

## 2017-12-27 NOTE — Telephone Encounter (Signed)
Okay thank you for letting me

## 2017-12-28 ENCOUNTER — Telehealth: Payer: Self-pay | Admitting: Medical Oncology

## 2017-12-28 NOTE — Telephone Encounter (Signed)
PT update-pt fell yesterday , orthostatic 160/90 sit to stand  140/80-pulse up to 120. Requests PT 2 x week for 4 weeks and OT evaluation.

## 2017-12-28 NOTE — Telephone Encounter (Signed)
PT update-MR [place] fell yesterday , orthostatic BPs. 160/90 sitting to standing 140/80.Request OT evaluation for assist with ADLs and PT 2 x week for 4 weeks. Okay per [place] .

## 2017-12-28 NOTE — Telephone Encounter (Signed)
err

## 2017-12-28 NOTE — Telephone Encounter (Signed)
No Encourage hydration at home for now.

## 2017-12-30 ENCOUNTER — Other Ambulatory Visit: Payer: Medicare Other

## 2017-12-30 DIAGNOSIS — I313 Pericardial effusion (noninflammatory): Secondary | ICD-10-CM | POA: Diagnosis not present

## 2017-12-30 DIAGNOSIS — D696 Thrombocytopenia, unspecified: Secondary | ICD-10-CM | POA: Diagnosis not present

## 2017-12-30 DIAGNOSIS — T827XXA Infection and inflammatory reaction due to other cardiac and vascular devices, implants and grafts, initial encounter: Secondary | ICD-10-CM | POA: Diagnosis not present

## 2017-12-30 DIAGNOSIS — C770 Secondary and unspecified malignant neoplasm of lymph nodes of head, face and neck: Secondary | ICD-10-CM | POA: Diagnosis not present

## 2017-12-30 DIAGNOSIS — C3432 Malignant neoplasm of lower lobe, left bronchus or lung: Secondary | ICD-10-CM | POA: Diagnosis not present

## 2017-12-30 DIAGNOSIS — J189 Pneumonia, unspecified organism: Secondary | ICD-10-CM | POA: Diagnosis not present

## 2017-12-31 DIAGNOSIS — C770 Secondary and unspecified malignant neoplasm of lymph nodes of head, face and neck: Secondary | ICD-10-CM | POA: Diagnosis not present

## 2017-12-31 DIAGNOSIS — I313 Pericardial effusion (noninflammatory): Secondary | ICD-10-CM | POA: Diagnosis not present

## 2017-12-31 DIAGNOSIS — D696 Thrombocytopenia, unspecified: Secondary | ICD-10-CM | POA: Diagnosis not present

## 2017-12-31 DIAGNOSIS — C3432 Malignant neoplasm of lower lobe, left bronchus or lung: Secondary | ICD-10-CM | POA: Diagnosis not present

## 2017-12-31 DIAGNOSIS — J189 Pneumonia, unspecified organism: Secondary | ICD-10-CM | POA: Diagnosis not present

## 2017-12-31 DIAGNOSIS — T827XXA Infection and inflammatory reaction due to other cardiac and vascular devices, implants and grafts, initial encounter: Secondary | ICD-10-CM | POA: Diagnosis not present

## 2018-01-03 ENCOUNTER — Telehealth: Payer: Self-pay | Admitting: Medical Oncology

## 2018-01-03 DIAGNOSIS — T827XXA Infection and inflammatory reaction due to other cardiac and vascular devices, implants and grafts, initial encounter: Secondary | ICD-10-CM | POA: Diagnosis not present

## 2018-01-03 DIAGNOSIS — D696 Thrombocytopenia, unspecified: Secondary | ICD-10-CM | POA: Diagnosis not present

## 2018-01-03 DIAGNOSIS — J189 Pneumonia, unspecified organism: Secondary | ICD-10-CM | POA: Diagnosis not present

## 2018-01-03 DIAGNOSIS — C770 Secondary and unspecified malignant neoplasm of lymph nodes of head, face and neck: Secondary | ICD-10-CM | POA: Diagnosis not present

## 2018-01-03 DIAGNOSIS — C3492 Malignant neoplasm of unspecified part of left bronchus or lung: Secondary | ICD-10-CM | POA: Diagnosis not present

## 2018-01-03 DIAGNOSIS — C3432 Malignant neoplasm of lower lobe, left bronchus or lung: Secondary | ICD-10-CM | POA: Diagnosis not present

## 2018-01-03 DIAGNOSIS — I313 Pericardial effusion (noninflammatory): Secondary | ICD-10-CM | POA: Diagnosis not present

## 2018-01-03 NOTE — Telephone Encounter (Signed)
Trouble staying asleep -can he take melatonin 5 mg timed released?  Ambien and benadryl not keeping him asleep. Constipation is getting resolved. Pt eating granola, whole grains and fiber.

## 2018-01-03 NOTE — Telephone Encounter (Signed)
Wife notified.

## 2018-01-03 NOTE — Telephone Encounter (Signed)
[place] if it would help.

## 2018-01-03 NOTE — Telephone Encounter (Signed)
BP stand to sit 120/70>102/72. Pulse 102 . Yellow mucous that is slightly green colored. Pt taking  mucinex . No fever.

## 2018-01-05 ENCOUNTER — Emergency Department (HOSPITAL_COMMUNITY): Payer: Medicare Other

## 2018-01-05 ENCOUNTER — Telehealth: Payer: Self-pay | Admitting: Medical Oncology

## 2018-01-05 ENCOUNTER — Other Ambulatory Visit: Payer: Self-pay

## 2018-01-05 ENCOUNTER — Inpatient Hospital Stay (HOSPITAL_COMMUNITY)
Admission: EM | Admit: 2018-01-05 | Discharge: 2018-01-13 | DRG: 871 | Disposition: A | Discharge status: Home Health | Payer: Medicare Other | Attending: Internal Medicine | Admitting: Internal Medicine

## 2018-01-05 ENCOUNTER — Encounter (HOSPITAL_COMMUNITY): Payer: Self-pay

## 2018-01-05 DIAGNOSIS — Z86711 Personal history of pulmonary embolism: Secondary | ICD-10-CM

## 2018-01-05 DIAGNOSIS — Z884 Allergy status to anesthetic agent status: Secondary | ICD-10-CM

## 2018-01-05 DIAGNOSIS — Z981 Arthrodesis status: Secondary | ICD-10-CM

## 2018-01-05 DIAGNOSIS — Z7901 Long term (current) use of anticoagulants: Secondary | ICD-10-CM | POA: Diagnosis not present

## 2018-01-05 DIAGNOSIS — Y95 Nosocomial condition: Secondary | ICD-10-CM | POA: Diagnosis present

## 2018-01-05 DIAGNOSIS — R531 Weakness: Secondary | ICD-10-CM | POA: Diagnosis not present

## 2018-01-05 DIAGNOSIS — D696 Thrombocytopenia, unspecified: Secondary | ICD-10-CM | POA: Diagnosis not present

## 2018-01-05 DIAGNOSIS — J189 Pneumonia, unspecified organism: Secondary | ICD-10-CM | POA: Diagnosis not present

## 2018-01-05 DIAGNOSIS — E876 Hypokalemia: Secondary | ICD-10-CM | POA: Diagnosis present

## 2018-01-05 DIAGNOSIS — G479 Sleep disorder, unspecified: Secondary | ICD-10-CM | POA: Diagnosis present

## 2018-01-05 DIAGNOSIS — Z96643 Presence of artificial hip joint, bilateral: Secondary | ICD-10-CM | POA: Diagnosis present

## 2018-01-05 DIAGNOSIS — Z515 Encounter for palliative care: Secondary | ICD-10-CM | POA: Diagnosis not present

## 2018-01-05 DIAGNOSIS — R0609 Other forms of dyspnea: Secondary | ICD-10-CM

## 2018-01-05 DIAGNOSIS — Z7952 Long term (current) use of systemic steroids: Secondary | ICD-10-CM

## 2018-01-05 DIAGNOSIS — R05 Cough: Secondary | ICD-10-CM | POA: Diagnosis not present

## 2018-01-05 DIAGNOSIS — Z87891 Personal history of nicotine dependence: Secondary | ICD-10-CM | POA: Diagnosis not present

## 2018-01-05 DIAGNOSIS — F329 Major depressive disorder, single episode, unspecified: Secondary | ICD-10-CM | POA: Diagnosis present

## 2018-01-05 DIAGNOSIS — R7989 Other specified abnormal findings of blood chemistry: Secondary | ICD-10-CM

## 2018-01-05 DIAGNOSIS — Z86718 Personal history of other venous thrombosis and embolism: Secondary | ICD-10-CM | POA: Diagnosis not present

## 2018-01-05 DIAGNOSIS — J181 Lobar pneumonia, unspecified organism: Secondary | ICD-10-CM | POA: Diagnosis present

## 2018-01-05 DIAGNOSIS — E039 Hypothyroidism, unspecified: Secondary | ICD-10-CM | POA: Diagnosis present

## 2018-01-05 DIAGNOSIS — C3432 Malignant neoplasm of lower lobe, left bronchus or lung: Secondary | ICD-10-CM | POA: Diagnosis not present

## 2018-01-05 DIAGNOSIS — Z79899 Other long term (current) drug therapy: Secondary | ICD-10-CM

## 2018-01-05 DIAGNOSIS — A419 Sepsis, unspecified organism: Principal | ICD-10-CM | POA: Diagnosis present

## 2018-01-05 DIAGNOSIS — D649 Anemia, unspecified: Secondary | ICD-10-CM | POA: Diagnosis not present

## 2018-01-05 DIAGNOSIS — Z9221 Personal history of antineoplastic chemotherapy: Secondary | ICD-10-CM

## 2018-01-05 DIAGNOSIS — Z7189 Other specified counseling: Secondary | ICD-10-CM | POA: Diagnosis not present

## 2018-01-05 DIAGNOSIS — D638 Anemia in other chronic diseases classified elsewhere: Secondary | ICD-10-CM | POA: Diagnosis present

## 2018-01-05 DIAGNOSIS — C3492 Malignant neoplasm of unspecified part of left bronchus or lung: Secondary | ICD-10-CM | POA: Diagnosis not present

## 2018-01-05 DIAGNOSIS — G4700 Insomnia, unspecified: Secondary | ICD-10-CM | POA: Diagnosis not present

## 2018-01-05 DIAGNOSIS — Z66 Do not resuscitate: Secondary | ICD-10-CM | POA: Diagnosis present

## 2018-01-05 DIAGNOSIS — R0789 Other chest pain: Secondary | ICD-10-CM | POA: Diagnosis not present

## 2018-01-05 DIAGNOSIS — R0602 Shortness of breath: Secondary | ICD-10-CM | POA: Diagnosis not present

## 2018-01-05 DIAGNOSIS — R404 Transient alteration of awareness: Secondary | ICD-10-CM | POA: Diagnosis not present

## 2018-01-05 DIAGNOSIS — I700 Atherosclerosis of aorta: Secondary | ICD-10-CM | POA: Diagnosis not present

## 2018-01-05 LAB — COMPREHENSIVE METABOLIC PANEL
ALK PHOS: 89 U/L (ref 38–126)
ALT: 19 U/L (ref 17–63)
AST: 63 U/L — ABNORMAL HIGH (ref 15–41)
Albumin: 2.1 g/dL — ABNORMAL LOW (ref 3.5–5.0)
Anion gap: 11 (ref 5–15)
BILIRUBIN TOTAL: 0.5 mg/dL (ref 0.3–1.2)
BUN: 16 mg/dL (ref 6–20)
CALCIUM: 8.3 mg/dL — AB (ref 8.9–10.3)
CO2: 25 mmol/L (ref 22–32)
CREATININE: 1.07 mg/dL (ref 0.61–1.24)
Chloride: 103 mmol/L (ref 101–111)
GFR calc non Af Amer: >60 mL/min (ref >60)
GLUCOSE: 163 mg/dL — AB (ref 65–99)
Potassium: 3.3 mmol/L — ABNORMAL LOW (ref 3.5–5.1)
SODIUM: 139 mmol/L (ref 135–145)
Total Protein: 6.7 g/dL (ref 6.5–8.1)

## 2018-01-05 LAB — I-STAT CHEM 8, ED
BUN: 15 mg/dL (ref 6–20)
CALCIUM ION: 1.12 mmol/L — AB (ref 1.15–1.40)
CHLORIDE: 102 mmol/L (ref 101–111)
Creatinine, Ser: 0.9 mg/dL (ref 0.61–1.24)
Glucose, Bld: 164 mg/dL — ABNORMAL HIGH (ref 65–99)
HCT: 31 % — ABNORMAL LOW (ref 39.0–52.0)
Hemoglobin: 10.5 g/dL — ABNORMAL LOW (ref 13.0–17.0)
Potassium: 3.4 mmol/L — ABNORMAL LOW (ref 3.5–5.1)
SODIUM: 139 mmol/L (ref 135–145)
TCO2: 25 mmol/L (ref 22–32)

## 2018-01-05 LAB — URINALYSIS, ROUTINE W REFLEX MICROSCOPIC
Bilirubin Urine: NEGATIVE
GLUCOSE, UA: NEGATIVE mg/dL
Ketones, ur: NEGATIVE mg/dL
Leukocytes, UA: NEGATIVE
Nitrite: NEGATIVE
Protein, ur: 100 mg/dL — AB
Specific Gravity, Urine: 1.043 — ABNORMAL HIGH (ref 1.005–1.030)
pH: 5 (ref 5.0–8.0)

## 2018-01-05 LAB — CBC WITH DIFFERENTIAL/PLATELET
Basophils Absolute: 0 10*3/uL (ref 0.0–0.1)
Basophils Relative: 0 %
EOS ABS: 0 10*3/uL (ref 0.0–0.7)
Eosinophils Relative: 0 %
HCT: 34.1 % — ABNORMAL LOW (ref 39.0–52.0)
Hemoglobin: 10.8 g/dL — ABNORMAL LOW (ref 13.0–17.0)
Lymphocytes Relative: 7 %
Lymphs Abs: 1.4 10*3/uL (ref 0.7–4.0)
MCH: 32.4 pg (ref 26.0–34.0)
MCHC: 31.7 g/dL (ref 30.0–36.0)
MCV: 102.4 fL — ABNORMAL HIGH (ref 78.0–100.0)
MONO ABS: 1.4 10*3/uL — AB (ref 0.1–1.0)
Monocytes Relative: 7 %
NEUTROS ABS: 17.9 10*3/uL — AB (ref 1.7–7.7)
NEUTROS PCT: 86 %
PLATELETS: 126 10*3/uL — AB (ref 150–400)
RBC: 3.33 MIL/uL — ABNORMAL LOW (ref 4.22–5.81)
RDW: 18.7 % — ABNORMAL HIGH (ref 11.5–15.5)
WBC: 20.7 10*3/uL — ABNORMAL HIGH (ref 4.0–10.5)

## 2018-01-05 LAB — I-STAT CG4 LACTIC ACID, ED
Lactic Acid, Venous: 1.09 mmol/L (ref 0.5–1.9)
Lactic Acid, Venous: 0.99 mmol/L (ref 0.5–1.9)

## 2018-01-05 MED ORDER — SUCRALFATE 1 GM/10ML PO SUSP
1.0000 g | Freq: Three times a day (TID) | ORAL | Status: DC
Start: 2018-01-05 — End: 2018-01-13
  Administered 2018-01-05 – 2018-01-13 (×27): 1 g via ORAL
  Filled 2018-01-05 (×28): qty 10

## 2018-01-05 MED ORDER — SENNOSIDES-DOCUSATE SODIUM 8.6-50 MG PO TABS
1.0000 | ORAL_TABLET | Freq: Every day | ORAL | Status: DC
  Administered 2018-01-05 – 2018-01-12 (×5): 1 via ORAL
  Filled 2018-01-05 (×7): qty 1

## 2018-01-05 MED ORDER — ENSURE ENLIVE PO LIQD
237.0000 mL | Freq: Two times a day (BID) | ORAL | Status: DC
  Administered 2018-01-06 – 2018-01-12 (×6): 237 mL via ORAL

## 2018-01-05 MED ORDER — ATORVASTATIN CALCIUM 10 MG PO TABS
20.0000 mg | ORAL_TABLET | Freq: Every day | ORAL | Status: DC
  Administered 2018-01-05 – 2018-01-12 (×8): 20 mg via ORAL
  Filled 2018-01-05 (×8): qty 2

## 2018-01-05 MED ORDER — SODIUM CHLORIDE 0.9 % IV BOLUS (SEPSIS)
2000.0000 mL | Freq: Once | INTRAVENOUS | Status: AC
  Administered 2018-01-05: 2000 mL via INTRAVENOUS

## 2018-01-05 MED ORDER — OXYCODONE-ACETAMINOPHEN 5-325 MG PO TABS: 1.0000 | ORAL_TABLET | Freq: Once | ORAL | Status: DC

## 2018-01-05 MED ORDER — VANCOMYCIN HCL IN DEXTROSE 1-5 GM/200ML-% IV SOLN
1000.0000 mg | Freq: Two times a day (BID) | INTRAVENOUS | Status: DC
  Administered 2018-01-05 – 2018-01-07 (×4): 1000 mg via INTRAVENOUS
  Filled 2018-01-05 (×4): qty 200

## 2018-01-05 MED ORDER — IOPAMIDOL (ISOVUE-370) INJECTION 76%
100.0000 mL | Freq: Once | INTRAVENOUS | Status: AC | PRN
  Administered 2018-01-05: 80 mL via INTRAVENOUS

## 2018-01-05 MED ORDER — HEPARIN SOD (PORK) LOCK FLUSH 100 UNIT/ML IV SOLN
250.0000 [IU] | Freq: Every day | INTRAVENOUS | Status: DC
  Filled 2018-01-05 (×9): qty 2.5

## 2018-01-05 MED ORDER — PROCHLORPERAZINE MALEATE 10 MG PO TABS: 10.0000 mg | ORAL_TABLET | Freq: Four times a day (QID) | ORAL | Status: DC | PRN

## 2018-01-05 MED ORDER — POLYETHYLENE GLYCOL 3350 17 G PO PACK: 17.0000 g | PACK | Freq: Every day | ORAL | Status: DC | PRN

## 2018-01-05 MED ORDER — DM-GUAIFENESIN ER 30-600 MG PO TB12: 1.0000 | ORAL_TABLET | Freq: Two times a day (BID) | ORAL | Status: DC | PRN

## 2018-01-05 MED ORDER — OXYCODONE HCL 5 MG/5ML PO SOLN
5.0000 mg | Freq: Once | ORAL | Status: AC
  Administered 2018-01-05: 5 mg via ORAL
  Filled 2018-01-05: qty 5

## 2018-01-05 MED ORDER — ALBUTEROL SULFATE (2.5 MG/3ML) 0.083% IN NEBU
3.0000 mL | INHALATION_SOLUTION | RESPIRATORY_TRACT | Status: DC | PRN
  Administered 2018-01-07: 3 mL via RESPIRATORY_TRACT
  Filled 2018-01-05: qty 3

## 2018-01-05 MED ORDER — SODIUM CHLORIDE 0.9 % IV SOLN
1.0000 g | Freq: Once | INTRAVENOUS | Status: AC
  Administered 2018-01-05: 1 g via INTRAVENOUS
  Filled 2018-01-05: qty 10

## 2018-01-05 MED ORDER — PIPERACILLIN-TAZOBACTAM 3.375 G IVPB 30 MIN
3.3750 g | Freq: Once | INTRAVENOUS | Status: AC
  Administered 2018-01-05: 3.375 g via INTRAVENOUS
  Filled 2018-01-05: qty 50

## 2018-01-05 MED ORDER — ACETAMINOPHEN 650 MG RE SUPP: 650.0000 mg | Freq: Four times a day (QID) | RECTAL | Status: DC | PRN

## 2018-01-05 MED ORDER — OXYCODONE HCL 5 MG/5ML PO SOLN
5.0000 mg | ORAL | Status: DC | PRN
  Administered 2018-01-05 – 2018-01-12 (×9): 5 mg via ORAL
  Filled 2018-01-05 (×11): qty 5

## 2018-01-05 MED ORDER — RIVAROXABAN 20 MG PO TABS
20.0000 mg | ORAL_TABLET | Freq: Every day | ORAL | Status: DC
  Administered 2018-01-05 – 2018-01-07 (×3): 20 mg via ORAL
  Filled 2018-01-05 (×3): qty 1

## 2018-01-05 MED ORDER — PIPERACILLIN-TAZOBACTAM 3.375 G IVPB
3.3750 g | Freq: Three times a day (TID) | INTRAVENOUS | Status: DC
  Administered 2018-01-05 – 2018-01-12 (×20): 3.375 g via INTRAVENOUS
  Filled 2018-01-05 (×21): qty 50

## 2018-01-05 MED ORDER — VANCOMYCIN HCL IN DEXTROSE 1-5 GM/200ML-% IV SOLN
1000.0000 mg | Freq: Once | INTRAVENOUS | Status: AC
  Administered 2018-01-05: 1000 mg via INTRAVENOUS
  Filled 2018-01-05: qty 200

## 2018-01-05 MED ORDER — HEPARIN SOD (PORK) LOCK FLUSH 100 UNIT/ML IV SOLN
250.0000 [IU] | INTRAVENOUS | Status: DC | PRN
  Filled 2018-01-05: qty 2.5

## 2018-01-05 MED ORDER — SALINE SPRAY 0.65 % NA SOLN: 2.0000 | NASAL | Status: DC | PRN

## 2018-01-05 MED ORDER — ACETAMINOPHEN 325 MG PO TABS: 650.0000 mg | ORAL_TABLET | Freq: Four times a day (QID) | ORAL | Status: DC | PRN

## 2018-01-05 NOTE — Progress Notes (Signed)
Pharmacy Antibiotic Note  Nicholas Hale is a 72 y.o. male admitted on 01/05/2018 with pneumonia.  Pharmacy has been consulted for vancomycin and Zosyn dosing.  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 After initial improvement he noted constant, progressive, now severe dyspnea associated with increasing weakness and nonproductive cough with pleuritic chest pain. CXR suggested progressive LLL opacity, confirmed by CTA chest to be progressive airspace disease beyond narrowed LLL and lingular bronchi narrowed by hilar mass. Blood cultures drawn, vancomycin and zosyn were started, and hospitalists called to admit for postobstructive pneumonia.   Plan:  Zosyn 3.375 gr IV q8h Extended infusion  Vancomycin 1000 mg IV q12h  Monitor clinical course, renal function, cultures as available   Height: 5\' 10"  (177.8 cm) Weight: 181 lb 3.5 oz (82.2 kg) IBW/kg (Calculated) : 73  Temp (24hrs), Avg:98.6 F (37 C), Min:98.2 F (36.8 C), Max:98.9 F (37.2 C)  Recent Labs  Lab 01/05/18 1026 01/05/18 1046 01/05/18 1525  WBC 20.7*  --   --   CREATININE 1.07 0.90  --   LATICACIDVEN  --  1.09 0.99    Estimated Creatinine Clearance: 77.7 mL/min (by C-G formula based on SCr of 0.9 mg/dL).    Allergies  Allergen Reactions  . Lidocaine Swelling    "Hard white blisters" formed on tongue, swollen mouth    Antimicrobials this admission: 2/6 vancomycin >>  2/6 Zosyn >>   Dose adjustments this admission: ---  Microbiology results: 2/6 BCx: sent 2/6 Sputum: sent    Thank you for allowing pharmacy to be a part of this patient's care.  Royetta Asal, PharmD, BCPS Pager [phone] 01/05/2018 5:33 PM

## 2018-01-05 NOTE — ED Notes (Signed)
ED TO INPATIENT HANDOFF REPORT  Name/Age/Gender Nicholas Hale 72 y.o. male  Code Status Code Status History    Date Active Date Inactive Code Status Order ID Comments User Context   12/15/2017 08:19 12/24/2017 17:37 Full Code [id number]  Jani Gravel, MD ED   08/21/2017 13:39 08/22/2017 17:20 Full Code [id number]  Truett Mainland, DO ED   02/01/2017 15:52 02/02/2017 03:10 Full Code [id number]  Arne Cleveland, MD [place]   09/19/2016 22:36 09/23/2016 16:45 Full Code [id number]  Ivor Costa, MD Inpatient   05/15/2015 17:53 05/17/2015 13:50 Full Code [id number]  Frederik Pear, MD Inpatient   08/07/2014 10:00 08/08/2014 14:40 Full Code [id number]  Wilhelmina Mcardle, MD Inpatient   08/06/2014 14:51 08/07/2014 10:00 Full Code [id number]  Sandi Mariscal, MD Inpatient   08/05/2014 20:48 08/06/2014 14:51 Full Code [id number]  Marybelle Killings, MD Inpatient   08/05/2014 17:30 08/05/2014 20:48 Full Code [id number]  Doree Fudge, MD ED   05/23/2014 15:27 05/25/2014 17:18 Full Code [id number]  Kerin Salen, MD Inpatient    Advance Directive Documentation     Most Recent Value  Type of Advance Directive  Healthcare Power of Attorney, Living will  Pre-existing out of facility DNR order (yellow form or pink MOST form)  No data  "MOST" Form in Place?  No data      Home/SNF/Other Home  Chief Complaint Pneumonia  Level of Care/Admitting Diagnosis ED Disposition    ED Disposition Condition Comment   Admit  Hospital Area: [facility] [[id number]]  Level of Care: Med-Surg [16]  Diagnosis: Postobstructive pneumonia [650354]  Admitting Physician: Patrecia Pour [[id number]]  Attending Physician: Patrecia Pour [phone]  Estimated length of stay: 3 - 4 days  Certification:: I certify this patient will need inpatient services for at least 2 midnights  PT Class (Do Not Modify): Inpatient [101]  PT Acc Code (Do Not Modify): Private [1]       Medical History Past Medical History:  Diagnosis Date  . Abdominal aortic  aneurysm ([place])   . Arthritis    "hips; lower spine" (05/24/2014)  . Chemotherapy induced neutropenia ([place]) 03/31/2017  . Chronic fatigue 10/05/2016  . Encounter for antineoplastic chemotherapy 02/17/2017  . History of radiation therapy 10/05/17-10/25/17   left lung/chest 35 Gy in 14 fractions  . Hyperlipidemia   . Hypertension   . Lung cancer ([place]) 09/25/2016  . PE (pulmonary embolism) 9/15  . Pericardial effusion 09/20/2016   Malignant effusion s/p pericardial drain  . Personal history of DVT (deep vein thrombosis) 9/15    Allergies Allergies  Allergen Reactions  . Lidocaine Swelling    "Hard white blisters" formed on tongue, swollen mouth    IV Location/Drains/Wounds Patient Lines/Drains/Airways Status   Active Line/Drains/Airways    Name:   Placement date:   Placement time:   Site:   Days:   Peripheral IV 01/05/18 Left Antecubital   01/05/18    0937    Antecubital   less than 1   Peripheral IV 01/05/18 Left Wrist   01/05/18    1052    Wrist   less than 1   PICC Single Lumen 17/00/17 PICC Right Basilic 39 cm 3 cm   49/44/96    7591    Basilic   12          Labs/Imaging Results for orders placed or performed during the hospital encounter of 01/05/18 (from the past 48 hour(s))  Comprehensive metabolic panel     Status: Abnormal  Collection Time: 01/05/18 10:26 AM  Result Value Ref Range   Sodium 139 135 - 145 mmol/L   Potassium 3.3 (L) 3.5 - 5.1 mmol/L   Chloride 103 101 - 111 mmol/L   CO2 25 22 - 32 mmol/L   Glucose, Bld 163 (H) 65 - 99 mg/dL   BUN 16 6 - 20 mg/dL   Creatinine, Ser 1.07 0.61 - 1.24 mg/dL   Calcium 8.3 (L) 8.9 - 10.3 mg/dL   Total Protein 6.7 6.5 - 8.1 g/dL   Albumin 2.1 (L) 3.5 - 5.0 g/dL   AST 63 (H) 15 - 41 U/L   ALT 19 17 - 63 U/L   Alkaline Phosphatase 89 38 - 126 U/L   Total Bilirubin 0.5 0.3 - 1.2 mg/dL   GFR calc non Af Amer >60 >60 mL/min   GFR calc Af Amer >60 >60 mL/min    Comment: (NOTE) The eGFR has been calculated using the CKD EPI  equation. This calculation has not been validated in all clinical situations. eGFR's persistently <60 mL/min signify possible Chronic Kidney Disease.    Anion gap 11 5 - 15    Comment: Performed at [facility], [place] [address] [place], [place] [address]  CBC WITH DIFFERENTIAL     Status: Abnormal   Collection Time: 01/05/18 10:26 AM  Result Value Ref Range   WBC 20.7 (H) 4.0 - 10.5 K/uL    Comment: REPEATED TO VERIFY   RBC 3.33 (L) 4.22 - 5.81 MIL/uL   Hemoglobin 10.8 (L) 13.0 - 17.0 g/dL   HCT 34.1 (L) 39.0 - 52.0 %   MCV 102.4 (H) 78.0 - 100.0 fL   MCH 32.4 26.0 - 34.0 pg   MCHC 31.7 30.0 - 36.0 g/dL   RDW 18.7 (H) 11.5 - 15.5 %   Platelets 126 (L) 150 - 400 K/uL   Neutrophils Relative % 86 %   Lymphocytes Relative 7 %   Monocytes Relative 7 %   Eosinophils Relative 0 %   Basophils Relative 0 %   Neutro Abs 17.9 (H) 1.7 - 7.7 K/uL   Lymphs Abs 1.4 0.7 - 4.0 K/uL   Monocytes Absolute 1.4 (H) 0.1 - 1.0 K/uL   Eosinophils Absolute 0.0 0.0 - 0.7 K/uL   Basophils Absolute 0.0 0.0 - 0.1 K/uL   Smear Review MORPHOLOGY UNREMARKABLE     Comment: Performed at [facility], [place] [address], [place], [place] [address]  I-Stat CG4 Lactic Acid, ED  (not at  [facility])     Status: None   Collection Time: 01/05/18 10:46 AM  Result Value Ref Range   Lactic Acid, Venous 1.09 0.5 - 1.9 mmol/L  I-stat chem 8, ed     Status: Abnormal   Collection Time: 01/05/18 10:46 AM  Result Value Ref Range   Sodium 139 135 - 145 mmol/L   Potassium 3.4 (L) 3.5 - 5.1 mmol/L   Chloride 102 101 - 111 mmol/L   BUN 15 6 - 20 mg/dL   Creatinine, Ser 0.90 0.61 - 1.24 mg/dL   Glucose, Bld 164 (H) 65 - 99 mg/dL   Calcium, Ion 1.12 (L) 1.15 - 1.40 mmol/L   TCO2 25 22 - 32 mmol/L   Hemoglobin 10.5 (L) 13.0 - 17.0 g/dL   HCT 31.0 (L) 39.0 - 52.0 %  I-Stat CG4 Lactic Acid, ED  (not at  [facility])     Status: None   Collection Time: 01/05/18  3:25 PM  Result Value  Ref Range   Lactic  Acid, Venous 0.99 0.5 - 1.9 mmol/L   Dg Chest 2 View  Result Date: 01/05/2018 CLINICAL DATA:  Shortness of breath. Stage IV lung cancer. Pneumonia. EXAM: CHEST  2 VIEW COMPARISON:  Two-view chest x-ray and CT of the chest 12/14/2017. FINDINGS: Heart size is normal. A right-sided PICC line terminates in the distal SVC. The right lung is clear. Progressive left lower lobe airspace disease versus mass lesion is noted. Left upper lobe is clear. IMPRESSION: 1. Progressive left lower lobe airspace opacity. This likely represents post obstructive disease or possibly lobar pneumonia in the setting of known neoplasm. 2. Right-sided PICC line in satisfactory position. Electronically Signed   By: San Morelle M.D.   On: 01/05/2018 10:16   Ct Angio Chest Pe W And/or Wo Contrast  Result Date: 01/05/2018 CLINICAL DATA:  Chest pain and shortness of breath. Cough. History of lung cancer. EXAM: CT ANGIOGRAPHY CHEST WITH CONTRAST TECHNIQUE: Multidetector CT imaging of the chest was performed using the standard protocol during bolus administration of intravenous contrast. Multiplanar CT image reconstructions and MIPs were obtained to evaluate the vascular anatomy. CONTRAST:  75m ISOVUE-370 IOPAMIDOL (ISOVUE-370) INJECTION 76% COMPARISON:  CT scan 12/14/2017 FINDINGS: Cardiovascular: The heart is normal in size and stable. No pericardial effusion. Stable mild tortuosity and atherosclerotic changes involving the thoracic aorta. No dissection. The branch vessels are patent. Stable scattered coronary artery calcifications. The pulmonary arterial tree is fairly well opacified. No filling defects to suggest pulmonary embolism. Mediastinum/Nodes: Persistent enlarged mediastinal and hilar lymph nodes. Infiltrating soft tissue in the left hilum is progressive and there is complete or near complete occlusion of the left lower lobe bronchus and also the lingular bronchus. Lungs/Pleura: Further narrowing of the right lower lobe  bronchus with progressive dense airspace consolidation in the right lobe. Findings suspicious for progressive tumor and postobstructive pneumonia. There is a new left pleural effusion. Several ill-defined nodular airspace opacities in the lingula are likely nodular infiltrates. Stable underlying emphysema. No metastatic nodules in the right lung. Upper Abdomen: No significant upper abdominal findings. No obvious hepatic metastatic disease. Musculoskeletal: No significant bony findings. No chest wall mass, supraclavicular or axillary lymphadenopathy. Review of the MIP images confirms the above findings. IMPRESSION: 1. Progressive ill-defined soft tissue infiltrating the left hilum and mediastinum with now complete or near complete occlusion of the left lower lobe bronchus and lingular bronchus. This is favored to represent tumor. Bronchoscopy is recommended. Extensive postobstructive pneumonia, worsened since the prior study. 2. Stable to slightly larger mediastinal and hilar lymph nodes. 3. No CT findings for pulmonary embolism. 4. Stable emphysematous changes and pulmonary scarring. Aortic Atherosclerosis (ICD10-I70.0) and Emphysema (ICD10-J43.9). Electronically Signed   By: PMarijo SanesM.D.   On: 01/05/2018 13:54    Pending Labs Unresulted Labs (From admission, onward)   Start     Ordered   01/05/18 1026  Blood Culture (routine x 2)  BLOOD CULTURE X 2,   STAT     01/05/18 1025   01/05/18 1026  Urinalysis, Routine w reflex microscopic  STAT,   STAT     01/05/18 1025   Signed and Held  Culture, sputum-assessment  Once,   R     Signed and Held      Vitals/Pain Today's Vitals   01/05/18 1230 01/05/18 1300 01/05/18 1513 01/05/18 1600  BP: 125/74 126/69 140/78 128/73  Pulse: (!) 103 96 99 96  Resp: (!) 28 (!) 25 20 (!) 30  Temp:  TempSrc:      SpO2: 98% 100% 99% 99%  PainSc:        Isolation Precautions No active isolations  Medications Medications  oxyCODONE-acetaminophen  (PERCOCET/ROXICET) 5-325 MG per tablet 1 tablet (0 tablets Oral Hold 01/05/18 0947)  calcium gluconate 1 g in sodium chloride 0.9 % 100 mL IVPB (not administered)  oxyCODONE (ROXICODONE) 5 MG/5ML solution 5 mg (5 mg Oral Given 01/05/18 1011)  sodium chloride 0.9 % bolus 2,000 mL (0 mLs Intravenous Stopped 01/05/18 1516)  vancomycin (VANCOCIN) IVPB 1000 mg/200 mL premix (0 mg Intravenous Stopped 01/05/18 1305)  piperacillin-tazobactam (ZOSYN) IVPB 3.375 g (0 g Intravenous Stopped 01/05/18 1105)  iopamidol (ISOVUE-370) 76 % injection 100 mL (80 mLs Intravenous Contrast Given 01/05/18 1310)    Mobility walks with person assist

## 2018-01-05 NOTE — ED Provider Notes (Signed)
[facility] DEPT Provider Note   CSN: [id number] Arrival date & time: 01/05/18  0859     History   Chief Complaint Chief Complaint  Patient presents with  . Abdominal Pain    Upper left quadran tnear ribs when coughing.   . Cough    HPI GAVIN TELFORD is a 72 y.o. male.  Patient complains of left-sided chest pain and cough.  Patient has recently been admitted to the hospital with pneumonia but the last couple days he has felt worse.   The history is provided by the patient.  Cough  This is a recurrent problem. The current episode started 12 to 24 hours ago. The problem occurs constantly. The problem has not changed since onset.The cough is non-productive. There has been no fever. Associated symptoms include chest pain. Pertinent negatives include no headaches.    Past Medical History:  Diagnosis Date  . Abdominal aortic aneurysm ([place])   . Arthritis    "hips; lower spine" (05/24/2014)  . Chemotherapy induced neutropenia ([place]) 03/31/2017  . Chronic fatigue 10/05/2016  . Encounter for antineoplastic chemotherapy 02/17/2017  . History of radiation therapy 10/05/17-10/25/17   left lung/chest 35 Gy in 14 fractions  . Hyperlipidemia   . Hypertension   . Lung cancer ([place]) 09/25/2016  . PE (pulmonary embolism) 9/15  . Pericardial effusion 09/20/2016   Malignant effusion s/p pericardial drain  . Personal history of DVT (deep vein thrombosis) 9/15    Patient Active Problem List   Diagnosis Date Noted  . 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 Staphylococcus aureus bacteremia   . Bacteremia   . Malnutrition of moderate degree 12/17/2017  . Pneumonia 12/14/2017  . Leukocytosis 12/14/2017  . Anemia 12/14/2017  . Hyponatremia 12/14/2017  . HCAP (healthcare-associated pneumonia)   . Influenza vaccination administered at current visit 09/02/2017  . Gingival bleeding 08/21/2017  . Thrombocytopenia ([place]) 08/21/2017  . Dyspnea on exertion 04/05/2017    . Chemotherapy induced neutropenia ([place]) 03/31/2017  . Port catheter in place 03/03/2017  . Encounter for antineoplastic chemotherapy 02/17/2017  . Adenocarcinoma of left lung, stage 4 ([place]) 10/05/2016  . Goals of care, counseling/discussion 10/05/2016  . Chronic fatigue 10/05/2016  . Lung cancer ([place]) 09/25/2016  . Acute deep vein thrombosis (DVT) of proximal vein of both lower extremities (HCC)   . Cardiac tamponade   . Elevated troponin 09/20/2016  . 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 Ventricular tachycardia ([place]) 09/19/2016  . Renal cyst, right 09/19/2016  . Liver lesion 09/19/2016  . Nonsustained ventricular tachycardia ([place])   . History of tobacco abuse 01/28/2016  . Primary osteoarthritis of right hip 05/10/2015  . 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 Arthritis of left hip 05/23/2014  . Hypertension 05/15/2014  . Hyperlipidemia 05/15/2014    Past Surgical History:  Procedure Laterality Date  . ANTERIOR FUSION CERVICAL SPINE  ~ 2005  . APPENDECTOMY  1978  . CARDIAC CATHETERIZATION N/A 09/21/2016   Procedure: Pericardiocentesis;  Surgeon: Jettie Booze, MD;  Location: [facility];  Service: Cardiovascular;  Laterality: N/A;  . COLONOSCOPY    . HEMIARTHROPLASTY SHOULDER FRACTURE Left 12/2008  . INGUINAL HERNIA REPAIR Bilateral 1992  . IR GENERIC HISTORICAL  02/01/2017   IR FLUORO GUIDE PORT INSERTION RIGHT 02/01/2017 Arne Cleveland, MD [facility]  . IR GENERIC HISTORICAL  02/01/2017   IR US GUIDE VASC ACCESS RIGHT 02/01/2017 Arne Cleveland, MD [facility]  . IR REMOVAL TUN ACCESS W/ PORT  W/O FL MOD SED  12/17/2017  . JOINT REPLACEMENT    . PERICARDIAL FLUID DRAINAGE  09/21/2016  . TEE WITHOUT CARDIOVERSION N/A 12/21/2017   Procedure: TRANSESOPHAGEAL ECHOCARDIOGRAM (TEE);  Surgeon: Josue Hector, MD;  Location: [facility];  Service: Cardiovascular;  Laterality: N/A;  . TOTAL HIP ARTHROPLASTY Left 05/23/2014   Procedure: TOTAL HIP  ARTHROPLASTY;  Surgeon: Kerin Salen, MD;  Location: [facility];  Service: Orthopedics;  Laterality: Left;  . TOTAL HIP ARTHROPLASTY Right 05/15/2015   Procedure: TOTAL HIP ARTHROPLASTY;  Surgeon: Frederik Pear, MD;  Location: [facility];  Service: Orthopedics;  Laterality: Right;       Home Medications    Prior to Admission medications   Medication Sig Start Date End Date Taking? Authorizing Provider  albuterol (PROVENTIL HFA;VENTOLIN HFA) 108 (90 Base) MCG/ACT inhaler Inhale 2 puffs into the lungs every 4 (four) hours as needed for wheezing or shortness of breath. 07/27/17  Yes Byrum, Rose Fillers, MD  atorvastatin (LIPITOR) 20 MG tablet TAKE 1 TABLET BY MOUTH EVERY DAY AT 6PM 10/12/17  Yes End, Harrell Gave, MD  dexamethasone (DECADRON) 4 MG tablet 4 mg by mouth twice a day the day before, day of and day after the chemotherapy every 3 weeks. 02/17/17  Yes [name], MD  Dextromethorphan-Guaifenesin [facility] DM PO) Take 20 mLs by mouth 2 (two) times daily as needed (chest congestion).    Yes [provider]  lidocaine-prilocaine (EMLA) cream Apply 1 application topically daily as needed (on port site).    Yes [provider]  magic mouthwash SOLN Take 5 mLs by mouth 4 (four) times daily. Prednisone 80 mg , Nystatin 30 ml, Benadryl 12.5 mg/5 ml. Mix to 240 ml, 1:1:1 concentration. 11/24/17  Yes [name], MD  Melatonin 3 MG TABS Take 1 tablet by mouth at bedtime as needed (sleep).   Yes [provider]  oxyCODONE (ROXICODONE) 5 MG/5ML solution Take 5 mLs (5 mg total) by mouth every 4 (four) hours as needed for severe pain. 12/02/17  Yes Tanner, Lyndon Code., PA-C  polyethylene glycol (MIRALAX / GLYCOLAX) packet Take 17 g by mouth daily. Patient taking differently: Take 17 g by mouth daily as needed for moderate constipation.  12/25/17  Yes Florencia Reasons, MD  prochlorperazine (COMPAZINE) 10 MG tablet Take 1 tablet (10 mg total) by mouth every 6 (six) hours as needed for nausea or  vomiting. 11/24/17  Yes [name], MD  senna-docusate (SENOKOT-S) 8.6-50 MG tablet Take 1 tablet by mouth at bedtime. 12/24/17  Yes Florencia Reasons, MD  sodium chloride (OCEAN) 0.65 % SOLN nasal spray Place 2 sprays into both nostrils as needed for congestion.   Yes [provider]  sucralfate (CARAFATE) 1 g tablet Take 1 tablet (1 g total) by mouth 4 (four) times daily -  with meals and at bedtime. Crush and dissolve in 10 cc of water prior to swallowing Patient taking differently: Take 1 g by mouth 4 (four) times daily -  before meals and at bedtime. Crush and dissolve in 10 cc of water prior to swallowing 10/25/17  Yes Gery Pray, MD  XARELTO 20 MG TABS tablet TAKE 1 TABLET BY MOUTH EVERY DAY 11/17/17  Yes Collene Gobble, MD  diphenhydrAMINE (BENADRYL) 25 MG tablet Take 1 tablet (25 mg total) by mouth at bedtime as needed for sleep. Patient not taking: Reported on 01/05/2018 12/24/17   Florencia Reasons, MD  lidocaine (XYLOCAINE) 2 % solution Use as directed 5 mLs in the mouth  or throat every 3 (three) hours as needed for mouth pain. Patient not taking: Reported on 01/05/2018 12/02/17   Harle Stanford., PA-C    Family History Family History  Problem Relation Age of Onset  . Clotting disorder Mother   . Diabetes Mellitus I Mother   . Prostate cancer Father     Social History Social History   Tobacco Use  . Smoking status: Former Smoker    Packs/day: 0.50    Years: 40.00    Pack years: 20.00    Types: Cigarettes    Last attempt to quit: 2010    Years since quitting: 9.1  . Smokeless tobacco: Never Used  Substance Use Topics  . Alcohol use: No  . Drug use: No     Allergies   Lidocaine   Review of Systems Review of Systems  Constitutional: Negative for appetite change and fatigue.  HENT: Negative for congestion, ear discharge and sinus pressure.   Eyes: Negative for discharge.  Respiratory: Positive for cough.   Cardiovascular: Positive for chest pain.  Gastrointestinal:  Negative for abdominal pain and diarrhea.  Genitourinary: Negative for frequency and hematuria.  Musculoskeletal: Negative for back pain.  Skin: Negative for rash.  Neurological: Negative for seizures and headaches.  Psychiatric/Behavioral: Negative for hallucinations.     Physical Exam Updated Vital Signs BP 130/75 (BP Location: Left Arm)   Pulse (!) 109   Temp 98.9 F (37.2 C) (Oral)   Resp (!) 27   SpO2 100%   Physical Exam  Constitutional: He is oriented to person, place, and time. He appears well-developed.  HENT:  Head: Normocephalic.  Eyes: Conjunctivae and EOM are normal. No scleral icterus.  Neck: Neck supple. No thyromegaly present.  Cardiovascular: Normal rate and regular rhythm. Exam reveals no gallop and no friction rub.  No murmur heard. Pulmonary/Chest: No stridor. He has no wheezes. He has no rales. He exhibits tenderness.  Abdominal: He exhibits no distension. There is no tenderness. There is no rebound.  Musculoskeletal: Normal range of motion. He exhibits no edema.  Lymphadenopathy:    He has no cervical adenopathy.  Neurological: He is oriented to person, place, and time. He exhibits normal muscle tone. Coordination normal.  Skin: No rash noted. No erythema.  Psychiatric: He has a normal mood and affect. His behavior is normal.     ED Treatments / Results  Labs (all labs ordered are listed, but only abnormal results are displayed) Labs Reviewed  COMPREHENSIVE METABOLIC PANEL - Abnormal; Notable for the following components:      Result Value   Potassium 3.3 (*)    Glucose, Bld 163 (*)    Calcium 8.3 (*)    Albumin 2.1 (*)    AST 63 (*)    All other components within normal limits  CBC WITH DIFFERENTIAL/PLATELET - Abnormal; Notable for the following components:   WBC 20.7 (*)    RBC 3.33 (*)    Hemoglobin 10.8 (*)    HCT 34.1 (*)    MCV 102.4 (*)    RDW 18.7 (*)    Platelets 126 (*)    Neutro Abs 17.9 (*)    Monocytes Absolute 1.4 (*)     All other components within normal limits  I-STAT CHEM 8, ED - Abnormal; Notable for the following components:   Potassium 3.4 (*)    Glucose, Bld 164 (*)    Calcium, Ion 1.12 (*)    Hemoglobin 10.5 (*)    HCT 31.0 (*)  All other components within normal limits  CULTURE, BLOOD (ROUTINE X 2)  CULTURE, BLOOD (ROUTINE X 2)  URINALYSIS, ROUTINE W REFLEX MICROSCOPIC  I-STAT CG4 LACTIC ACID, ED  I-STAT CG4 LACTIC ACID, ED    EKG  EKG Interpretation None       Radiology Dg Chest 2 View  Result Date: 01/05/2018 CLINICAL DATA:  Shortness of breath. Stage IV lung cancer. Pneumonia. EXAM: CHEST  2 VIEW COMPARISON:  Two-view chest x-ray and CT of the chest 12/14/2017. FINDINGS: Heart size is normal. A right-sided PICC line terminates in the distal SVC. The right lung is clear. Progressive left lower lobe airspace disease versus mass lesion is noted. Left upper lobe is clear. IMPRESSION: 1. Progressive left lower lobe airspace opacity. This likely represents post obstructive disease or possibly lobar pneumonia in the setting of known neoplasm. 2. Right-sided PICC line in satisfactory position. Electronically Signed   By: San Morelle M.D.   On: 01/05/2018 10:16   Ct Angio Chest Pe W And/or Wo Contrast  Result Date: 01/05/2018 CLINICAL DATA:  Chest pain and shortness of breath. Cough. History of lung cancer. EXAM: CT ANGIOGRAPHY CHEST WITH CONTRAST TECHNIQUE: Multidetector CT imaging of the chest was performed using the standard protocol during bolus administration of intravenous contrast. Multiplanar CT image reconstructions and MIPs were obtained to evaluate the vascular anatomy. CONTRAST:  16mL ISOVUE-370 IOPAMIDOL (ISOVUE-370) INJECTION 76% COMPARISON:  CT scan 12/14/2017 FINDINGS: Cardiovascular: The heart is normal in size and stable. No pericardial effusion. Stable mild tortuosity and atherosclerotic changes involving the thoracic aorta. No dissection. The branch vessels are patent.  Stable scattered coronary artery calcifications. The pulmonary arterial tree is fairly well opacified. No filling defects to suggest pulmonary embolism. Mediastinum/Nodes: Persistent enlarged mediastinal and hilar lymph nodes. Infiltrating soft tissue in the left hilum is progressive and there is complete or near complete occlusion of the left lower lobe bronchus and also the lingular bronchus. Lungs/Pleura: Further narrowing of the right lower lobe bronchus with progressive dense airspace consolidation in the right lobe. Findings suspicious for progressive tumor and postobstructive pneumonia. There is a new left pleural effusion. Several ill-defined nodular airspace opacities in the lingula are likely nodular infiltrates. Stable underlying emphysema. No metastatic nodules in the right lung. Upper Abdomen: No significant upper abdominal findings. No obvious hepatic metastatic disease. Musculoskeletal: No significant bony findings. No chest wall mass, supraclavicular or axillary lymphadenopathy. Review of the MIP images confirms the above findings. IMPRESSION: 1. Progressive ill-defined soft tissue infiltrating the left hilum and mediastinum with now complete or near complete occlusion of the left lower lobe bronchus and lingular bronchus. This is favored to represent tumor. Bronchoscopy is recommended. Extensive postobstructive pneumonia, worsened since the prior study. 2. Stable to slightly larger mediastinal and hilar lymph nodes. 3. No CT findings for pulmonary embolism. 4. Stable emphysematous changes and pulmonary scarring. Aortic Atherosclerosis (ICD10-I70.0) and Emphysema (ICD10-J43.9). Electronically Signed   By: Marijo Sanes M.D.   On: 01/05/2018 13:54    Procedures Procedures (including critical care time)  Medications Ordered in ED Medications  oxyCODONE-acetaminophen (PERCOCET/ROXICET) 5-325 MG per tablet 1 tablet (0 tablets Oral Hold 01/05/18 0947)  oxyCODONE (ROXICODONE) 5 MG/5ML solution 5 mg  (5 mg Oral Given 01/05/18 1011)  sodium chloride 0.9 % bolus 2,000 mL (2,000 mLs Intravenous New Bag/Given 01/05/18 1051)  vancomycin (VANCOCIN) IVPB 1000 mg/200 mL premix (0 mg Intravenous Stopped 01/05/18 1305)  piperacillin-tazobactam (ZOSYN) IVPB 3.375 g (0 g Intravenous Stopped 01/05/18 1105)  iopamidol (ISOVUE-370) 76 %  injection 100 mL (80 mLs Intravenous Contrast Given 01/05/18 1310)     Initial Impression / Assessment and Plan / ED Course  I have reviewed the triage vital signs and the nursing notes.  Pertinent labs & imaging results that were available during my care of the patient were reviewed by me and considered in my medical decision making (see chart for details).     Labs reviewed patient has a 20,000 white count.  Chest x-ray shows pneumonia.  I have spoke to oncology Dr. Earlie Server and he wished patient to be admitted to medicine and they will follow patient.  Patient will be given IV antibiotics and will get a CT angios rule out PE or worsening cancer  Final Clinical Impressions(s) / ED Diagnoses   Final diagnoses:  Healthcare-associated pneumonia    ED Discharge Orders    None       Milton Ferguson, MD 01/05/18 1414

## 2018-01-05 NOTE — Progress Notes (Signed)
[facility]  [name] is an active [place] pt with [place] on home IV ABX for MSSA bacteremia  Regimen: Ancef 2g IV q8 hr End date: 01/01/2018  [facility] hospital team will follow pt while here to support transition home when ready.  If patient discharges after hours, please call [phone].   Larry Sierras 01/05/2018, 9:51 AM

## 2018-01-05 NOTE — Telephone Encounter (Signed)
I called [facility] ( Patty) cancelled weekly labs for [facility]. Pt has PICC line ( per ID for Vancomycin). Vancomycin completed . Does Mohamed want PICC line to remain?. I told Patty that Dr [name] may use the PICC line for more treatments and to maintain catheter care.

## 2018-01-05 NOTE — ED Triage Notes (Signed)
Patient fail in the floor this morning trying to get back in bed.

## 2018-01-05 NOTE — ED Notes (Signed)
Hospitalist at the bedside 

## 2018-01-05 NOTE — Progress Notes (Signed)
During bedside report, patient and spouse, requested that patient's code status be changed back to full code status.  They requested that code status be readdressed in 2 days after talking to pulmonary and oncology doctors.

## 2018-01-05 NOTE — ED Triage Notes (Signed)
Patient arrived from home. Recently discharged for pneumonia. Lung cancer patient. C/O of pain on left upper quadrant rib area. Coughing of thick mucous. Patient on 4L of 02 at 99%. p-128.

## 2018-01-05 NOTE — ED Notes (Signed)
Patient transported to X-ray 

## 2018-01-05 NOTE — Progress Notes (Signed)
Patient and spouse asked further questions, and affirmed previous dnr order.  Patient and spouse did not understand what dnr meant.  Patient stated that all he wants is life saving medications.

## 2018-01-05 NOTE — H&P (Signed)
History and Physical   Nicholas Hale ZJQ:[id number] DOB: [birth year] DOA: 01/05/2018  Referring MD/NP/PA: Roderic Palau, MD, EDP PCP: [name], MD Outpatient Specialists: [name], oncology; Byrum, pulmonology  Patient coming from: Home  Chief Complaint: Dyspnea  HPI: Nicholas Hale is a 72 y.o. male with a history of DVT/PE on xarelto and stage IV NSCLC with recent admission for pneumonia and MSSA bacteremia discharged 1/25 on ancef thru PICC through 2/2. After initial improvement he noted constant, progressive, now severe dyspnea associated with increasing weakness and nonproductive cough with pleuritic chest pain. No fevers. He fell due to weakness getting into bed this morning prompting return to ED where he was ill-appearing, dyspneic and placed on oxygen. CXR suggested progressive LLL opacity, confirmed by CTA chest to be progressive airspace disease beyond narrowed LLL and lingular bronchi narrowed by hilar mass. Blood cultures drawn, vancomycin and zosyn were started, and hospitalists called to admit for postobstructive pneumonia. The patient's oncologist, Dr. [name] was called for consult.  Review of Systems: No chills. Taking very little po. All others reviewed and are negative.   Past Medical History:  Diagnosis Date  . Abdominal aortic aneurysm ([place])   . Arthritis    "hips; lower spine" (05/24/2014)  . Chemotherapy induced neutropenia ([place]) 03/31/2017  . Chronic fatigue 10/05/2016  . Encounter for antineoplastic chemotherapy 02/17/2017  . History of radiation therapy 10/05/17-10/25/17   left lung/chest 35 Gy in 14 fractions  . Hyperlipidemia   . Hypertension   . Lung cancer ([place]) 09/25/2016  . PE (pulmonary embolism) 9/15  . Pericardial effusion 09/20/2016   Malignant effusion s/p pericardial drain  . Personal history of DVT (deep vein thrombosis) 9/15   Past Surgical History:  Procedure Laterality Date  . ANTERIOR FUSION CERVICAL SPINE  ~ 2005  . APPENDECTOMY  1978  .  CARDIAC CATHETERIZATION N/A 09/21/2016   Procedure: Pericardiocentesis;  Surgeon: Jettie Booze, MD;  Location: [facility];  Service: Cardiovascular;  Laterality: N/A;  . COLONOSCOPY    . HEMIARTHROPLASTY SHOULDER FRACTURE Left 12/2008  . INGUINAL HERNIA REPAIR Bilateral 1992  . IR GENERIC HISTORICAL  02/01/2017   IR FLUORO GUIDE PORT INSERTION RIGHT 02/01/2017 Arne Cleveland, MD [facility]  . IR GENERIC HISTORICAL  02/01/2017   IR US GUIDE VASC ACCESS RIGHT 02/01/2017 Arne Cleveland, MD [facility]  . IR REMOVAL TUN ACCESS W/ PORT W/O FL MOD SED  12/17/2017  . JOINT REPLACEMENT    . PERICARDIAL FLUID DRAINAGE  09/21/2016  . TEE WITHOUT CARDIOVERSION N/A 12/21/2017   Procedure: TRANSESOPHAGEAL ECHOCARDIOGRAM (TEE);  Surgeon: Josue Hector, MD;  Location: [facility] ENDOSCOPY;  Service: Cardiovascular;  Laterality: N/A;  . TOTAL HIP ARTHROPLASTY Left 05/23/2014   Procedure: TOTAL HIP ARTHROPLASTY;  Surgeon: Kerin Salen, MD;  Location: [facility];  Service: Orthopedics;  Laterality: Left;  . TOTAL HIP ARTHROPLASTY Right 05/15/2015   Procedure: TOTAL HIP ARTHROPLASTY;  Surgeon: Frederik Pear, MD;  Location: [facility];  Service: Orthopedics;  Laterality: Right;   - Non smoker (used to smoke cigarettes), no EtOH or illicit drug use. Lives at home with wife. Daughter also helps care for him, she's near graduating from NP school.  reports that he quit smoking about 9 years ago. His smoking use included cigarettes. He has a 20.00 pack-year smoking history. he has never used smokeless tobacco. He reports that he does not drink alcohol or use drugs. Allergies  Allergen Reactions  . Lidocaine Swelling    "Hard white blisters"  formed on tongue, swollen mouth   Family History  Problem Relation Age of Onset  . Clotting disorder Mother   . Diabetes Mellitus I Mother   . Prostate cancer Father    - Family history otherwise reviewed and not pertinent. Prior to Admission medications   Medication Sig Start  Date End Date Taking? Authorizing Provider  albuterol (PROVENTIL HFA;VENTOLIN HFA) 108 (90 Base) MCG/ACT inhaler Inhale 2 puffs into the lungs every 4 (four) hours as needed for wheezing or shortness of breath. 07/27/17  Yes Byrum, Rose Fillers, MD  atorvastatin (LIPITOR) 20 MG tablet TAKE 1 TABLET BY MOUTH EVERY DAY AT 6PM 10/12/17  Yes End, Harrell Gave, MD  dexamethasone (DECADRON) 4 MG tablet 4 mg by mouth twice a day the day before, day of and day after the chemotherapy every 3 weeks. 02/17/17  Yes [name], MD  Dextromethorphan-Guaifenesin [facility] DM PO) Take 20 mLs by mouth 2 (two) times daily as needed (chest congestion).    Yes [provider]  lidocaine-prilocaine (EMLA) cream Apply 1 application topically daily as needed (on port site).    Yes [provider]  magic mouthwash SOLN Take 5 mLs by mouth 4 (four) times daily. Prednisone 80 mg , Nystatin 30 ml, Benadryl 12.5 mg/5 ml. Mix to 240 ml, 1:1:1 concentration. 11/24/17  Yes [name], MD  Melatonin 3 MG TABS Take 1 tablet by mouth at bedtime as needed (sleep).   Yes [provider]  oxyCODONE (ROXICODONE) 5 MG/5ML solution Take 5 mLs (5 mg total) by mouth every 4 (four) hours as needed for severe pain. 12/02/17  Yes Tanner, Lyndon Code., PA-C  polyethylene glycol (MIRALAX / GLYCOLAX) packet Take 17 g by mouth daily. Patient taking differently: Take 17 g by mouth daily as needed for moderate constipation.  12/25/17  Yes Florencia Reasons, MD  prochlorperazine (COMPAZINE) 10 MG tablet Take 1 tablet (10 mg total) by mouth every 6 (six) hours as needed for nausea or vomiting. 11/24/17  Yes [name], MD  senna-docusate (SENOKOT-S) 8.6-50 MG tablet Take 1 tablet by mouth at bedtime. 12/24/17  Yes Florencia Reasons, MD  sodium chloride (OCEAN) 0.65 % SOLN nasal spray Place 2 sprays into both nostrils as needed for congestion.   Yes [provider]  sucralfate (CARAFATE) 1 g tablet Take 1 tablet (1 g total) by mouth 4  (four) times daily -  with meals and at bedtime. Crush and dissolve in 10 cc of water prior to swallowing Patient taking differently: Take 1 g by mouth 4 (four) times daily -  before meals and at bedtime. Crush and dissolve in 10 cc of water prior to swallowing 10/25/17  Yes Gery Pray, MD  XARELTO 20 MG TABS tablet TAKE 1 TABLET BY MOUTH EVERY DAY 11/17/17  Yes Collene Gobble, MD  diphenhydrAMINE (BENADRYL) 25 MG tablet Take 1 tablet (25 mg total) by mouth at bedtime as needed for sleep. Patient not taking: Reported on 01/05/2018 12/24/17   Florencia Reasons, MD  lidocaine (XYLOCAINE) 2 % solution Use as directed 5 mLs in the mouth or throat every 3 (three) hours as needed for mouth pain. Patient not taking: Reported on 01/05/2018 12/02/17   Harle Stanford., PA-C    Physical Exam: Vitals:   01/05/18 1130 01/05/18 1230 01/05/18 1300 01/05/18 1513  BP: 130/75 125/74 126/69 140/78  Pulse: (!) 109 (!) 103 96 99  Resp: (!) 27 (!) 28 (!) 25 20  Temp:      TempSrc:  SpO2: 100% 98% 100% 99%   Constitutional: 72 y.o. male in no distress, calm demeanor Eyes: Lids and conjunctivae normal, PERRL ENMT: Mucous membranes are moist. Posterior pharynx clear of any exudate or lesions. Fair dentition.  Neck: normal, supple, no masses, no thyromegaly Respiratory: Tachypneic with supplemental oxygen saturating in high 90%'s. Diminished globally, worst in left lower/mid lung zones. Cardiovascular: Regular rate and rhythm, no murmurs, rubs, or gallops. No carotid bruits. No JVD. No LE edema. 2+ pedal pulses. Abdomen: Normoactive bowel sounds. No tenderness, non-distended, and no masses palpated. No hepatosplenomegaly. GU: No indwelling catheter Musculoskeletal: No clubbing / cyanosis. No joint deformity upper and lower extremities. Good ROM, no contractures. Normal muscle tone.  Skin: Warm, dry. RUE PICC c/d/i without erythema, induration or fluctuance. No rashes, wounds, no ulcers. No significant lesions noted.    Neurologic: CN II-XII grossly intact. Speech normal. No focal deficits in motor strength or sensation in all extremities.  Psychiatric: Alert and oriented x3. Normal judgment and insight. Mood depressed with broad affect.   Labs on Admission: I have personally reviewed following labs and imaging studies  CBC: Recent Labs  Lab 01/05/18 1026 01/05/18 1046  WBC 20.7*  --   NEUTROABS 17.9*  --   HGB 10.8* 10.5*  HCT 34.1* 31.0*  MCV 102.4*  --   PLT 126*  --    Basic Metabolic Panel: Recent Labs  Lab 01/05/18 1026 01/05/18 1046  NA 139 139  K 3.3* 3.4*  CL 103 102  CO2 25  --   GLUCOSE 163* 164*  BUN 16 15  CREATININE 1.07 0.90  CALCIUM 8.3*  --    Liver Function Tests: Recent Labs  Lab 01/05/18 1026  AST 63*  ALT 19  ALKPHOS 89  BILITOT 0.5  PROT 6.7  ALBUMIN 2.1*   Radiological Exams on Admission: Dg Chest 2 View  Result Date: 01/05/2018 CLINICAL DATA:  Shortness of breath. Stage IV lung cancer. Pneumonia. EXAM: CHEST  2 VIEW COMPARISON:  Two-view chest x-ray and CT of the chest 12/14/2017. FINDINGS: Heart size is normal. A right-sided PICC line terminates in the distal SVC. The right lung is clear. Progressive left lower lobe airspace disease versus mass lesion is noted. Left upper lobe is clear. IMPRESSION: 1. Progressive left lower lobe airspace opacity. This likely represents post obstructive disease or possibly lobar pneumonia in the setting of known neoplasm. 2. Right-sided PICC line in satisfactory position. Electronically Signed   By: San Morelle M.D.   On: 01/05/2018 10:16   Ct Angio Chest Pe W And/or Wo Contrast  Result Date: 01/05/2018 CLINICAL DATA:  Chest pain and shortness of breath. Cough. History of lung cancer. EXAM: CT ANGIOGRAPHY CHEST WITH CONTRAST TECHNIQUE: Multidetector CT imaging of the chest was performed using the standard protocol during bolus administration of intravenous contrast. Multiplanar CT image reconstructions and MIPs were  obtained to evaluate the vascular anatomy. CONTRAST:  75mL ISOVUE-370 IOPAMIDOL (ISOVUE-370) INJECTION 76% COMPARISON:  CT scan 12/14/2017 FINDINGS: Cardiovascular: The heart is normal in size and stable. No pericardial effusion. Stable mild tortuosity and atherosclerotic changes involving the thoracic aorta. No dissection. The branch vessels are patent. Stable scattered coronary artery calcifications. The pulmonary arterial tree is fairly well opacified. No filling defects to suggest pulmonary embolism. Mediastinum/Nodes: Persistent enlarged mediastinal and hilar lymph nodes. Infiltrating soft tissue in the left hilum is progressive and there is complete or near complete occlusion of the left lower lobe bronchus and also the lingular bronchus. Lungs/Pleura: Further narrowing  of the right lower lobe bronchus with progressive dense airspace consolidation in the right lobe. Findings suspicious for progressive tumor and postobstructive pneumonia. There is a new left pleural effusion. Several ill-defined nodular airspace opacities in the lingula are likely nodular infiltrates. Stable underlying emphysema. No metastatic nodules in the right lung. Upper Abdomen: No significant upper abdominal findings. No obvious hepatic metastatic disease. Musculoskeletal: No significant bony findings. No chest wall mass, supraclavicular or axillary lymphadenopathy. Review of the MIP images confirms the above findings. IMPRESSION: 1. Progressive ill-defined soft tissue infiltrating the left hilum and mediastinum with now complete or near complete occlusion of the left lower lobe bronchus and lingular bronchus. This is favored to represent tumor. Bronchoscopy is recommended. Extensive postobstructive pneumonia, worsened since the prior study. 2. Stable to slightly larger mediastinal and hilar lymph nodes. 3. No CT findings for pulmonary embolism. 4. Stable emphysematous changes and pulmonary scarring. Aortic Atherosclerosis (ICD10-I70.0)  and Emphysema (ICD10-J43.9). Electronically Signed   By: Marijo Sanes M.D.   On: 01/05/2018 13:54   EKG: Independently reviewed. Sinus tachycardia, vent rate 111bpm, no ischemic changes.   Assessment/Plan Principal Problem:   Postobstructive pneumonia Active Problems:   Adenocarcinoma of left lung, stage 4 (HCC)   Thrombocytopenia (HCC)   Anemia   Sepsis due to pneumonia ([place])   Sepsis due to postobstructive LLL pneumonia: Tachycardia, tachypnea, leukocytosis.  - Continue HCAP antibiotics, narrow as able. Had recent MSSA bacteremia, still with PICC - Blood cultures drawn, ordered sputum culture - Pulmonology consulted for consideration of bronchoscopy. No interventionalists locally, if stenting is considered would need referral. No intervention planned at this time, ok to continue xarelto and start diet.  Stage IV NSCLC: s/p XRT completed Nov 2018 and last chemotherapy Dec 2018. Has history of malignant pericardial effusion, though no effusion currently. - Dr. [name] consulted to discuss therapeutic options with patient and family.  - The patient openly professes the desire "for this all to end." We discussed the need to know the available therapeutic options and to have this acute illness treated, though he wishes to discuss the alternative, comfort measures only. I have consulted palliative care for ongoing discussions. As of now, pt is DNR, but continuing all measures.   History of DVT/PE: No PE on CTA.  - Continue xarelto, monitor for bleeding (had epistaxis at last admission)  Thrombocytopenia: Improved from baseline at 126 on admission.  - Monitor, continue anticoagulation.  Anemia of chronic disease: Hgb at baseline.  - Monitor  Elevated TSH: At recent admission, TSH 17 with normal free T4.  - Continue plan to recheck TFTs in 4-6 weeks.  Hypocalcemia: Ionized calcium mildly low.  - Give calcium gluconate and recheck.   DVT prophylaxis: Xarelto  Code Status: DNR  confirmed at admission  Family Communication: Wife and daughter at bedside Disposition Plan: Uncertain Consults called: Dr. [name] called by EDP; Pulmonology, Marni Griffon, NP  Admission status: Inpatient    Vance Gather, MD [facility] Pager [phone]  If 7PM-7AM, please contact night-coverage [email or web address] Password [facility] 01/05/2018, 4:13 PM

## 2018-01-06 ENCOUNTER — Other Ambulatory Visit: Payer: Medicare Other

## 2018-01-06 ENCOUNTER — Ambulatory Visit
Admit: 2018-01-06 | Discharge: 2018-01-06 | Disposition: A | Discharge status: Home / Self Care | Payer: Medicare Other | Source: Ambulatory Visit | Attending: Radiation Oncology | Admitting: Radiation Oncology

## 2018-01-06 ENCOUNTER — Ambulatory Visit: Payer: Medicare Other

## 2018-01-06 ENCOUNTER — Telehealth: Payer: Self-pay | Admitting: Medical Oncology

## 2018-01-06 ENCOUNTER — Ambulatory Visit: Payer: Medicare Other | Admitting: Internal Medicine

## 2018-01-06 DIAGNOSIS — C3492 Malignant neoplasm of unspecified part of left bronchus or lung: Secondary | ICD-10-CM | POA: Insufficient documentation

## 2018-01-06 DIAGNOSIS — E876 Hypokalemia: Secondary | ICD-10-CM

## 2018-01-06 DIAGNOSIS — C3432 Malignant neoplasm of lower lobe, left bronchus or lung: Secondary | ICD-10-CM

## 2018-01-06 DIAGNOSIS — Z7189 Other specified counseling: Secondary | ICD-10-CM

## 2018-01-06 DIAGNOSIS — R7989 Other specified abnormal findings of blood chemistry: Secondary | ICD-10-CM

## 2018-01-06 DIAGNOSIS — J189 Pneumonia, unspecified organism: Secondary | ICD-10-CM

## 2018-01-06 DIAGNOSIS — Z515 Encounter for palliative care: Secondary | ICD-10-CM

## 2018-01-06 DIAGNOSIS — Z510 Encounter for antineoplastic radiation therapy: Secondary | ICD-10-CM | POA: Insufficient documentation

## 2018-01-06 LAB — CBC WITH DIFFERENTIAL/PLATELET
BASOS PCT: 0 %
Basophils Absolute: 0 10*3/uL (ref 0.0–0.1)
EOS ABS: 0.1 10*3/uL (ref 0.0–0.7)
EOS PCT: 0 %
HCT: 30.4 % — ABNORMAL LOW (ref 39.0–52.0)
Hemoglobin: 9.3 g/dL — ABNORMAL LOW (ref 13.0–17.0)
Lymphocytes Relative: 4 %
Lymphs Abs: 0.7 10*3/uL (ref 0.7–4.0)
MCH: 31.8 pg (ref 26.0–34.0)
MCHC: 30.6 g/dL (ref 30.0–36.0)
MCV: 104.1 fL — ABNORMAL HIGH (ref 78.0–100.0)
MONO ABS: 0.9 10*3/uL (ref 0.1–1.0)
MONOS PCT: 6 %
Neutro Abs: 13.5 10*3/uL — ABNORMAL HIGH (ref 1.7–7.7)
Neutrophils Relative %: 90 %
Platelets: 121 10*3/uL — ABNORMAL LOW (ref 150–400)
RBC: 2.92 MIL/uL — ABNORMAL LOW (ref 4.22–5.81)
RDW: 18.5 % — AB (ref 11.5–15.5)
WBC: 15.1 10*3/uL — ABNORMAL HIGH (ref 4.0–10.5)

## 2018-01-06 LAB — MRSA PCR SCREENING: MRSA BY PCR: NEGATIVE

## 2018-01-06 LAB — BASIC METABOLIC PANEL
Anion gap: 8 (ref 5–15)
BUN: 14 mg/dL (ref 6–20)
CALCIUM: 8 mg/dL — AB (ref 8.9–10.3)
CO2: 22 mmol/L (ref 22–32)
CREATININE: 0.88 mg/dL (ref 0.61–1.24)
Chloride: 106 mmol/L (ref 101–111)
GFR calc Af Amer: >60 mL/min (ref >60)
GFR calc non Af Amer: >60 mL/min (ref >60)
GLUCOSE: 208 mg/dL — AB (ref 65–99)
Potassium: 3.3 mmol/L — ABNORMAL LOW (ref 3.5–5.1)
Sodium: 136 mmol/L (ref 135–145)

## 2018-01-06 LAB — INFLUENZA PANEL BY PCR (TYPE A & B)
Influenza A By PCR: NEGATIVE
Influenza B By PCR: NEGATIVE

## 2018-01-06 LAB — MAGNESIUM: MAGNESIUM: 1.5 mg/dL — AB (ref 1.7–2.4)

## 2018-01-06 MED ORDER — POTASSIUM CHLORIDE CRYS ER 20 MEQ PO TBCR
40.0000 meq | EXTENDED_RELEASE_TABLET | Freq: Once | ORAL | Status: AC
  Administered 2018-01-06: 40 meq via ORAL
  Filled 2018-01-06: qty 2

## 2018-01-06 MED ORDER — MAGNESIUM SULFATE 4 GM/100ML IV SOLN
4.0000 g | Freq: Once | INTRAVENOUS | Status: AC
  Administered 2018-01-06: 4 g via INTRAVENOUS
  Filled 2018-01-06: qty 100

## 2018-01-06 MED ORDER — HYDROCODONE-HOMATROPINE 5-1.5 MG/5ML PO SYRP
5.0000 mL | ORAL_SOLUTION | Freq: Four times a day (QID) | ORAL | Status: DC | PRN
  Administered 2018-01-06 – 2018-01-10 (×3): 5 mL via ORAL
  Filled 2018-01-06 (×3): qty 5

## 2018-01-06 MED ORDER — DM-GUAIFENESIN ER 30-600 MG PO TB12
2.0000 | ORAL_TABLET | Freq: Two times a day (BID) | ORAL | Status: DC
  Administered 2018-01-06 – 2018-01-13 (×15): 2 via ORAL
  Filled 2018-01-06: qty 2
  Filled 2018-01-06: qty 1
  Filled 2018-01-06 (×3): qty 2
  Filled 2018-01-06: qty 1
  Filled 2018-01-06 (×10): qty 2

## 2018-01-06 NOTE — Progress Notes (Signed)
Initial Nutrition Assessment  DOCUMENTATION CODES:   Not applicable  INTERVENTION:   Ensure Enlive po BID, each supplement provides 350 kcal and 20 grams of protein  NUTRITION DIAGNOSIS:   Increased nutrient needs related to cancer and cancer related treatments as evidenced by estimated needs.  GOAL:   Patient will meet greater than or equal to 90% of their needs  MONITOR:   PO intake, Supplement acceptance, Weight trends, Labs  REASON FOR ASSESSMENT:   Malnutrition Screening Tool    ASSESSMENT:   Pt with PMH significant for stage IV NSCLC s/p chemotherapy and XRT (December 2018), HTN, and HLD. Recently admitted for sepsis secondary to pneumonia, discharged 1/25. Presents this admission with sepsis related to postobstructive LLL pneumonia.    RD familiar with this pt. Spoke with him and his wife at bedside. States appetite has been stable since being discharge 1/25. Intake is not back to normal but pt can now tolerate full sandwiches and meals. Pt usually eats 2-3 meals per day with some snacking in between. Home health nurse suggested pt drink 3 Ensures per day, but pt typically fits in two. Pt ate french toast, Cheerios, and whole milk without any complication. Pt amendable to continuing Ensure this stay.   Pt denies any recent weight loss from last admission. Records indicate pt's weight has fluctuated throughout each admission. Pt was noted to weigh 191 lb last admission and 181 lb this admission. When asked about weight loss pt states his home scale reads 175 lb and has for over two months. Suspect weight in January is bed weight error. Do not suspect significant weight loss.   Nutrition-Focused physical exam completed.  Medications reviewed and include: carafate, IV abx Labs reviewed: K 3.3 (L) CBG 111-208 Mg 1.5 (L) AST 63 (H) Vit B12 995 (H)  NUTRITION - FOCUSED PHYSICAL EXAM:    Most Recent Value  Orbital Region  Mild depletion  Upper Arm Region  No depletion   Thoracic and Lumbar Region  Unable to assess  Buccal Region  Mild depletion  Temple Region  No depletion  Clavicle Bone Region  Mild depletion  Clavicle and Acromion Bone Region  No depletion  Scapular Bone Region  Unable to assess  Dorsal Hand  No depletion  Patellar Region  Mild depletion  Anterior Thigh Region  Mild depletion  Posterior Calf Region  Mild depletion  Edema (RD Assessment)  Mild  Hair  Reviewed  Eyes  Reviewed  Mouth  Reviewed  Skin  Reviewed  Nails  Reviewed     Diet Order:  Diet regular Room service appropriate? Yes; Fluid consistency: Thin  EDUCATION NEEDS:      Skin:  Skin Assessment: Reviewed RN Assessment  Last BM:  01/05/18  Height:   Ht Readings from Last 1 Encounters:  01/05/18 5\' 10"  (1.778 m)    Weight:   Wt Readings from Last 1 Encounters:  01/05/18 181 lb 3.5 oz (82.2 kg)    Ideal Body Weight:  75.5 kg  BMI:  Body mass index is 26 kg/m.  Estimated Nutritional Needs:   Kcal:  2000-2200 kcal/day  Protein:  105-115 g/day  Fluid:  >2 L/day    Mariana Single RD, LDN Clinical Nutrition Pager # - [phone]

## 2018-01-06 NOTE — Progress Notes (Signed)
PROGRESS NOTE    VINEETH FELL  OIZ:[id number] DOB: May 25, 1946 DOA: 01/05/2018 PCP: [name], MD    Brief Narrative:   Nicholas Hale is a 72 y.o. male with a history of DVT/PE on xarelto and stage IV NSCLC with recent admission for pneumonia and MSSA bacteremia discharged 1/25 on ancef thru PICC through 2/2. After initial improvement he noted constant, progressive, now severe dyspnea associated with increasing weakness and nonproductive cough with pleuritic chest pain. No fevers. He fell due to weakness getting into bed this morning prompting return to ED where he was ill-appearing, dyspneic and placed on oxygen. CXR suggested progressive LLL opacity, confirmed by CTA chest to be progressive airspace disease beyond narrowed LLL and lingular bronchi narrowed by hilar mass. Blood cultures drawn, vancomycin and zosyn were started, and hospitalists called to admit for postobstructive pneumonia. The patient's oncologist, Dr. [name] was called for consult.     Assessment & Plan:   Principal Problem:   Postobstructive pneumonia Active Problems:   Adenocarcinoma of left lung, stage 4 (HCC)   Thrombocytopenia (HCC)   Anemia   Sepsis due to pneumonia (HCC)   Hypokalemia   Elevated TSH  1 sepsis secondary to postobstructive left lower lobe pneumonia Patient on admission met criteria for sepsis secondary to tachycardia, tachypnea, leukocytosis, CT scan concerning for postobstructive pneumonia with progression of lung disease in the left hilar region resulting in near complete left lower lung obstruction/collapse.  Patient currently afebrile.  MRSA PCR negative.  Blood cultures pending.  Sputum Gram stain and cultures pending.  Continue empiric IV antibiotics of vancomycin and Zosyn.  Patient has been seen in consultation by pulmonary, Dr. [name] who feels no role for bronchoscopy at this time and recommending continued IV antibiotics, palliative care consultation, consultation with  oncology and rad/onc for possible palliative radiation.  Patient has been seen by radiation oncology who recommended some palliative radiation during his hospitalization.  Patient has also been seen by his oncologist Dr. [name] who is in agreement with retreatment with radiation if possible and if there is improvement in patient's condition may consider resuming systemic chemotherapy.  Appreciate pulmonary's input and recommendations.  Oncology and radial oncology following.  2.  Stage IV non-small cell lung cancer: Status post XRT completed November 2018 and last chemotherapy December 2018. Patient with history of malignant pericardial effusion.  No current effusion noted at this time.  Patient with progression of disease leading to postobstructive pneumonia.  Patient has been seen by radiation oncology who are planning palliative radiation.  Patient has also been seen by his oncologist, Dr. [name] who is in agreement with retreatment with radiation if possible and if there is improvement in patient's condition may consider resuming his systemic chemotherapy.  Due to this we will hold off on palliative care consultation which was discussed with palliative care MD.  Will defer palliative care and hospice discussions to patient's primary oncologist.  3.  History of DVT/PE CT angiogram chest negative for any acute PE.  Patient with no overt bleeding.  Continue anticoagulation with Xarelto.  Follow.  4.  Thrombocytopenia Stable.  Monitor.  5.  Anemia of chronic disease H&H stable.  Transfusion threshold hemoglobin less than 7.  6.  Elevated TSH During recent admission TSH was 17 with a normal free T4.  Patient will need repeat thyroid function studies done in about 4-6 weeks I will follow-up with PCP.  7.  Hypocalcemia Patient given a dose of calcium gluconate on admission.  Follow.  8.  Hypokalemia/hypomagnesemia Replete.    DVT prophylaxis: xarelto Code Status: DNR Family  Communication: updated patient and wife at bedside. Disposition Plan: To be determined.  Likely back home.   Consultants:   Pulmonary: Dr. [name] 01/06/2018  Oncology: Dr. [name] 01/06/2018  Procedures:   CT angiogram chest 01/05/2018  Chest x-ray 01/05/2018    Antimicrobials:   IV Zosyn 01/05/2018  IV vancomycin 01/05/2018   Subjective: Patient sitting up in chair.  Patient states no significant change with shortness of breath.  No significant change with cough.  Wife wondering whether pulmonary oncology will come by to see the patient.  Objective: Vitals:   01/05/18 2022 01/06/18 0551 01/06/18 0950 01/06/18 1330  BP: 124/63 135/76 (!) 144/72 (!) 125/59  Pulse: 100 (!) 105 (!) 107 (!) 113  Resp: 20 20 (!) 22 (!) 21  Temp: 98.5 F (36.9 C) 99 F (37.2 C) 98.6 F (37 C) 98.4 F (36.9 C)  TempSrc: Oral Oral Oral Oral  SpO2: 100% 97% 98% 99%  Weight:      Height:        Intake/Output Summary (Last 24 hours) at 01/06/2018 2016 Last data filed at 01/06/2018 1809 Gross per 24 hour  Intake 1280 ml  Output -  Net 1280 ml   Filed Weights   01/05/18 1713  Weight: 82.2 kg (181 lb 3.5 oz)    Examination:  General exam: Appears calm and comfortable  Respiratory system: Some scattered coarse breath sounds.  No wheezing.  Fair air movement.  Respiratory effort normal. Cardiovascular system: S1 & S2 heard, RRR. No JVD, murmurs, rubs, gallops or clicks. No pedal edema. Gastrointestinal system: Abdomen is nondistended, soft and nontender. No organomegaly or masses felt. Normal bowel sounds heard. Central nervous system: Alert and oriented. No focal neurological deficits. Extremities: Symmetric 5 x 5 power. Skin: No rashes, lesions or ulcers Psychiatry: Judgement and insight appear normal. Mood & affect appropriate.     Data Reviewed: I have personally reviewed following labs and imaging studies  CBC: Recent Labs  Lab 01/05/18 1026 01/05/18 1046 01/06/18 0904  WBC  20.7*  --  15.1*  NEUTROABS 17.9*  --  13.5*  HGB 10.8* 10.5* 9.3*  HCT 34.1* 31.0* 30.4*  MCV 102.4*  --  104.1*  PLT 126*  --  956*   Basic Metabolic Panel: Recent Labs  Lab 01/05/18 1026 01/05/18 1046 01/06/18 0904  NA 139 139 136  K 3.3* 3.4* 3.3*  CL 103 102 106  CO2 25  --  22  GLUCOSE 163* 164* 208*  BUN _0 CREATININE 1.07 0.90 0.88  CALCIUM 8.3*  --  8.0*  MG  --   --  1.5*   GFR: Estimated Creatinine Clearance: 79.5 mL/min (by C-G formula based on SCr of 0.88 mg/dL). Liver Function Tests: Recent Labs  Lab 01/05/18 1026  AST 63*  ALT 19  ALKPHOS 89  BILITOT 0.5  PROT 6.7  ALBUMIN 2.1*   No results for input(s): LIPASE, AMYLASE in the last 168 hours. No results for input(s): AMMONIA in the last 168 hours. Coagulation Profile: No results for input(s): INR, PROTIME in the last 168 hours. Cardiac Enzymes: No results for input(s): CKTOTAL, CKMB, CKMBINDEX, TROPONINI in the last 168 hours. BNP (last 3 results) No results for input(s): PROBNP in the last 8760 hours. HbA1C: No results for input(s): HGBA1C in the last 72 hours. CBG: No results for input(s): GLUCAP in the last 168 hours. Lipid  Profile: No results for input(s): CHOL, HDL, LDLCALC, TRIG, CHOLHDL, LDLDIRECT in the last 72 hours. Thyroid Function Tests: No results for input(s): TSH, T4TOTAL, FREET4, T3FREE, THYROIDAB in the last 72 hours. Anemia Panel: No results for input(s): VITAMINB12, FOLATE, FERRITIN, TIBC, IRON, RETICCTPCT in the last 72 hours. Sepsis Labs: Recent Labs  Lab 01/05/18 1046 01/05/18 1525  LATICACIDVEN 1.09 0.99    Recent Results (from the past 240 hour(s))  Blood Culture (routine x 2)     Status: None (Preliminary result)   Collection Time: 01/05/18 10:26 AM  Result Value Ref Range Status   Specimen Description   Final    BLOOD LEFT ANTECUBITAL Performed at [facility] [address], [place], [place] [address]    Special Requests   Final      BOTTLES DRAWN AEROBIC AND ANAEROBIC Blood Culture adequate volume Performed at [facility] [address] [place], [place] [address]    Culture   Final    NO GROWTH 1 DAY Performed at [facility], [place] [address], [place], [place] [address]    Report Status PENDING  Incomplete  Blood Culture (routine x 2)     Status: None (Preliminary result)   Collection Time: 01/05/18 10:30 AM  Result Value Ref Range Status   Specimen Description   Final    BLOOD LEFT WRIST Performed at [facility] [address], [place], [place] [address]    Special Requests   Final    BOTTLES DRAWN AEROBIC AND ANAEROBIC Blood Culture adequate volume Performed at [facility] [address], [place], [place] [address]    Culture   Final    NO GROWTH 1 DAY Performed at [facility], [place] [address], [place], [place] [address]    Report Status PENDING  Incomplete  MRSA PCR Screening     Status: None   Collection Time: 01/06/18 12:08 PM  Result Value Ref Range Status   MRSA by PCR NEGATIVE NEGATIVE Final    Comment:        The GeneXpert MRSA Assay (FDA approved for NASAL specimens only), is one component of a comprehensive MRSA colonization surveillance program. It is not intended to diagnose MRSA infection nor to guide or monitor treatment for MRSA infections. Performed at [facility], [place] [address] [place], [place] [address]          Radiology Studies: Dg Chest 2 View  Result Date: 01/05/2018 CLINICAL DATA:  Shortness of breath. Stage IV lung cancer. Pneumonia. EXAM: CHEST  2 VIEW COMPARISON:  Two-view chest x-ray and CT of the chest 12/14/2017. FINDINGS: Heart size is normal. A right-sided PICC line terminates in the distal SVC. The right lung is clear. Progressive left lower lobe airspace disease versus mass lesion is noted. Left upper lobe is clear. IMPRESSION: 1. Progressive left lower lobe  airspace opacity. This likely represents post obstructive disease or possibly lobar pneumonia in the setting of known neoplasm. 2. Right-sided PICC line in satisfactory position. Electronically Signed   By: San Morelle M.D.   On: 01/05/2018 10:16   Ct Angio Chest Pe W And/or Wo Contrast  Result Date: 01/05/2018 CLINICAL DATA:  Chest pain and shortness of breath. Cough. History of lung cancer. EXAM: CT ANGIOGRAPHY CHEST WITH CONTRAST TECHNIQUE: Multidetector CT imaging of the chest was performed using the standard protocol during bolus administration of intravenous contrast. Multiplanar CT image reconstructions and MIPs were obtained to evaluate  the vascular anatomy. CONTRAST:  48m ISOVUE-370 IOPAMIDOL (ISOVUE-370) INJECTION 76% COMPARISON:  CT scan 12/14/2017 FINDINGS: Cardiovascular: The heart is normal in size and stable. No pericardial effusion. Stable mild tortuosity and atherosclerotic changes involving the thoracic aorta. No dissection. The branch vessels are patent. Stable scattered coronary artery calcifications. The pulmonary arterial tree is fairly well opacified. No filling defects to suggest pulmonary embolism. Mediastinum/Nodes: Persistent enlarged mediastinal and hilar lymph nodes. Infiltrating soft tissue in the left hilum is progressive and there is complete or near complete occlusion of the left lower lobe bronchus and also the lingular bronchus. Lungs/Pleura: Further narrowing of the right lower lobe bronchus with progressive dense airspace consolidation in the right lobe. Findings suspicious for progressive tumor and postobstructive pneumonia. There is a new left pleural effusion. Several ill-defined nodular airspace opacities in the lingula are likely nodular infiltrates. Stable underlying emphysema. No metastatic nodules in the right lung. Upper Abdomen: No significant upper abdominal findings. No obvious hepatic metastatic disease. Musculoskeletal: No significant bony findings. No  chest wall mass, supraclavicular or axillary lymphadenopathy. Review of the MIP images confirms the above findings. IMPRESSION: 1. Progressive ill-defined soft tissue infiltrating the left hilum and mediastinum with now complete or near complete occlusion of the left lower lobe bronchus and lingular bronchus. This is favored to represent tumor. Bronchoscopy is recommended. Extensive postobstructive pneumonia, worsened since the prior study. 2. Stable to slightly larger mediastinal and hilar lymph nodes. 3. No CT findings for pulmonary embolism. 4. Stable emphysematous changes and pulmonary scarring. Aortic Atherosclerosis (ICD10-I70.0) and Emphysema (ICD10-J43.9). Electronically Signed   By: PMarijo SanesM.D.   On: 01/05/2018 13:54        Scheduled Meds: . atorvastatin  20 mg Oral q1800  . dextromethorphan-guaiFENesin  2 tablet Oral BID  . feeding supplement (ENSURE ENLIVE)  237 mL Oral BID BM  . heparin lock flush  250 Units Intracatheter Daily  . oxyCODONE-acetaminophen  1 tablet Oral Once  . rivaroxaban  20 mg Oral q1800  . senna-docusate  1 tablet Oral QHS  . sucralfate  1 g Oral TID WC & HS   Continuous Infusions: . piperacillin-tazobactam (ZOSYN)  IV 3.375 g (01/06/18 1809)  . vancomycin 1,000 mg (01/06/18 1809)     LOS: 1 day    Time spent: 482mins    DIrine Seal MD [facility] Pager [phone] If 7PM-7AM, please contact night-coverage [email or web address] Password [email or web address] 01/06/2018, 8:16 PM

## 2018-01-06 NOTE — Consult Note (Signed)
Name: Nicholas Hale MRN: [id number] DOB: 28-Mar-1946    ADMISSION DATE:  01/05/2018 CONSULTATION DATE:  2/7  REFERRING MD :  [place] Silos [facility])   CHIEF COMPLAINT:  Lung ca, post obstructive pneumonia. Eval for bronchoscopy  BRIEF PATIENT DESCRIPTION: 72 year old male with history PE on Xarelto, hypertension, stage IV non-small cell lung cancer with malignant pericardial effusion diagnosed 08/2016 followed by Dr. Earlie Server and currently being treated with chemotherapy.  Status post palliative radiation to left hilar soft tissue mass.  Patient was recently admitted 1/15-1/25 with HCAP, MSSA bacteremia thought secondary to Port-A-Cath.  He was discharged with Ancef via PICC which completed 2/2.  He initially improved but returned 2/6 with progressive dyspnea, weakness, nonproductive cough, pleuritic chest pain.  ER workup revealed postobstructive LLL pneumonia and he was admitted by hospitalist.  Pulmonary consulted because radiologist on CT scan reading had recommended a bronchoscopy  SIGNIFICANT EVENTS    STUDIES:  CT chest 2/6>>> 1. Progressive ill-defined soft tissue infiltrating the left hilum and mediastinum with now complete or near complete occlusion of the left lower lobe bronchus and lingular bronchus. This is favored to represent tumor. Bronchoscopy is recommended. Extensive postobstructive pneumonia, worsened since the prior study. 2. Stable to slightly larger mediastinal and hilar lymph nodes. 3. No CT findings for pulmonary embolism. 4. Stable emphysematous changes and pulmonary scarring. I have reviewed the images personally.  PAST MEDICAL HISTORY :   has a past medical history of Abdominal aortic aneurysm ([place]), Arthritis, Chemotherapy induced neutropenia ([place]) (03/31/2017), Chronic fatigue (10/05/2016), Encounter for antineoplastic chemotherapy (02/17/2017), History of radiation therapy (10/05/17-10/25/17), Hyperlipidemia, Hypertension, Lung cancer ([place]) (09/25/2016), PE (pulmonary  embolism) (9/15), Pericardial effusion (09/20/2016), and Personal history of DVT (deep vein thrombosis) (9/15).  has a past surgical history that includes Hemiarthroplasty shoulder fracture (Left, 12/2008); Appendectomy (1978); Anterior fusion cervical spine (~ 2005); Colonoscopy; Total hip arthroplasty (Left, 05/23/2014); Joint replacement; Inguinal hernia repair (Bilateral, 1992); Total hip arthroplasty (Right, 05/15/2015); Cardiac catheterization (N/A, 09/21/2016); Pericardial fluid drainage (09/21/2016); ir generic historical (02/01/2017); ir generic historical (02/01/2017); IR REMOVAL TUN ACCESS W/ PORT W/O FL MOD SED (12/17/2017); and TEE without cardioversion (N/A, 12/21/2017).   Prior to Admission medications   Medication Sig Start Date End Date Taking? Authorizing Provider  albuterol (PROVENTIL HFA;VENTOLIN HFA) 108 (90 Base) MCG/ACT inhaler Inhale 2 puffs into the lungs every 4 (four) hours as needed for wheezing or shortness of breath. 07/27/17  Yes Byrum, Rose Fillers, MD  atorvastatin (LIPITOR) 20 MG tablet TAKE 1 TABLET BY MOUTH EVERY DAY AT 6PM 10/12/17  Yes End, Harrell Gave, MD  dexamethasone (DECADRON) 4 MG tablet 4 mg by mouth twice a day the day before, day of and day after the chemotherapy every 3 weeks. 02/17/17  Yes [name], MD  Dextromethorphan-Guaifenesin [facility] DM PO) Take 20 mLs by mouth 2 (two) times daily as needed (chest congestion).    Yes [provider]  lidocaine-prilocaine (EMLA) cream Apply 1 application topically daily as needed (on port site).    Yes [provider]  magic mouthwash SOLN Take 5 mLs by mouth 4 (four) times daily. Prednisone 80 mg , Nystatin 30 ml, Benadryl 12.5 mg/5 ml. Mix to 240 ml, 1:1:1 concentration. 11/24/17  Yes [name], MD  Melatonin 3 MG TABS Take 1 tablet by mouth at bedtime as needed (sleep).   Yes [provider]  oxyCODONE (ROXICODONE) 5 MG/5ML solution Take 5 mLs (5 mg total) by mouth every 4 (four) hours  as needed for severe pain.  12/02/17  Yes Tanner, Lyndon Code., PA-C  polyethylene glycol (MIRALAX / GLYCOLAX) packet Take 17 g by mouth daily. Patient taking differently: Take 17 g by mouth daily as needed for moderate constipation.  12/25/17  Yes Florencia Reasons, MD  prochlorperazine (COMPAZINE) 10 MG tablet Take 1 tablet (10 mg total) by mouth every 6 (six) hours as needed for nausea or vomiting. 11/24/17  Yes [name], MD  senna-docusate (SENOKOT-S) 8.6-50 MG tablet Take 1 tablet by mouth at bedtime. 12/24/17  Yes Florencia Reasons, MD  sodium chloride (OCEAN) 0.65 % SOLN nasal spray Place 2 sprays into both nostrils as needed for congestion.   Yes [provider]  sucralfate (CARAFATE) 1 g tablet Take 1 tablet (1 g total) by mouth 4 (four) times daily -  with meals and at bedtime. Crush and dissolve in 10 cc of water prior to swallowing Patient taking differently: Take 1 g by mouth 4 (four) times daily -  before meals and at bedtime. Crush and dissolve in 10 cc of water prior to swallowing 10/25/17  Yes Gery Pray, MD  XARELTO 20 MG TABS tablet TAKE 1 TABLET BY MOUTH EVERY DAY 11/17/17  Yes Collene Gobble, MD  diphenhydrAMINE (BENADRYL) 25 MG tablet Take 1 tablet (25 mg total) by mouth at bedtime as needed for sleep. Patient not taking: Reported on 01/05/2018 12/24/17   Florencia Reasons, MD  lidocaine (XYLOCAINE) 2 % solution Use as directed 5 mLs in the mouth or throat every 3 (three) hours as needed for mouth pain. Patient not taking: Reported on 01/05/2018 12/02/17   Harle Stanford., PA-C   Allergies  Allergen Reactions  . Lidocaine Swelling    "Hard white blisters" formed on tongue, swollen mouth    FAMILY HISTORY:  family history includes Clotting disorder in his mother; Diabetes Mellitus I in his mother; Prostate cancer in his father. SOCIAL HISTORY:  reports that he quit smoking about 9 years ago. His smoking use included cigarettes. He has a 20.00 pack-year smoking history. he has never used smokeless  tobacco. He reports that he does not drink alcohol or use drugs.  REVIEW OF SYSTEMS:   As per HPI - All other systems reviewed and were neg.   SUBJECTIVE:   VITAL SIGNS: Temp:  [98.2 F (36.8 C)-99 F (37.2 C)] 98.6 F (37 C) (02/07 0950) Pulse Rate:  [96-109] 107 (02/07 0950) Resp:  [20-30] 22 (02/07 0950) BP: (123-144)/(63-78) 144/72 (02/07 0950) SpO2:  [97 %-100 %] 98 % (02/07 0950) Weight:  [82.2 kg (181 lb 3.5 oz)] 82.2 kg (181 lb 3.5 oz) (02/06 1713)  PHYSICAL EXAMINATION: Gen:      No acute distress, frail, weak HEENT:  EOMI, sclera anicteric Neck:     No masses; no thyromegaly Lungs:    Left lung rhonchi CV:         Regular rate and rhythm; no murmurs Abd:      + bowel sounds; soft, non-tender; no palpable masses, no distension Ext:    No edema; adequate peripheral perfusion Skin:      Warm and dry; no rash Neuro: alert and oriented x 3  Recent Labs  Lab 01/05/18 1026 01/05/18 1046 01/06/18 0904  NA 139 139 136  K 3.3* 3.4* 3.3*  CL 103 102 106  CO2 25  --  22  BUN 16 15 14   CREATININE 1.07 0.90 0.88  GLUCOSE 163* 164* 208*   Recent Labs  Lab 01/05/18 1026 01/05/18 1046 01/06/18 0904  HGB 10.8* 10.5* 9.3*  HCT 34.1* 31.0* 30.4*  WBC 20.7*  --  15.1*  PLT 126*  --  121*   Dg Chest 2 View  Result Date: 01/05/2018 CLINICAL DATA:  Shortness of breath. Stage IV lung cancer. Pneumonia. EXAM: CHEST  2 VIEW COMPARISON:  Two-view chest x-ray and CT of the chest 12/14/2017. FINDINGS: Heart size is normal. A right-sided PICC line terminates in the distal SVC. The right lung is clear. Progressive left lower lobe airspace disease versus mass lesion is noted. Left upper lobe is clear. IMPRESSION: 1. Progressive left lower lobe airspace opacity. This likely represents post obstructive disease or possibly lobar pneumonia in the setting of known neoplasm. 2. Right-sided PICC line in satisfactory position. Electronically Signed   By: San Morelle M.D.   On:  01/05/2018 10:16   Ct Angio Chest Pe W And/or Wo Contrast  Result Date: 01/05/2018 CLINICAL DATA:  Chest pain and shortness of breath. Cough. History of lung cancer. EXAM: CT ANGIOGRAPHY CHEST WITH CONTRAST TECHNIQUE: Multidetector CT imaging of the chest was performed using the standard protocol during bolus administration of intravenous contrast. Multiplanar CT image reconstructions and MIPs were obtained to evaluate the vascular anatomy. CONTRAST:  91mL ISOVUE-370 IOPAMIDOL (ISOVUE-370) INJECTION 76% COMPARISON:  CT scan 12/14/2017 FINDINGS: Cardiovascular: The heart is normal in size and stable. No pericardial effusion. Stable mild tortuosity and atherosclerotic changes involving the thoracic aorta. No dissection. The branch vessels are patent. Stable scattered coronary artery calcifications. The pulmonary arterial tree is fairly well opacified. No filling defects to suggest pulmonary embolism. Mediastinum/Nodes: Persistent enlarged mediastinal and hilar lymph nodes. Infiltrating soft tissue in the left hilum is progressive and there is complete or near complete occlusion of the left lower lobe bronchus and also the lingular bronchus. Lungs/Pleura: Further narrowing of the right lower lobe bronchus with progressive dense airspace consolidation in the right lobe. Findings suspicious for progressive tumor and postobstructive pneumonia. There is a new left pleural effusion. Several ill-defined nodular airspace opacities in the lingula are likely nodular infiltrates. Stable underlying emphysema. No metastatic nodules in the right lung. Upper Abdomen: No significant upper abdominal findings. No obvious hepatic metastatic disease. Musculoskeletal: No significant bony findings. No chest wall mass, supraclavicular or axillary lymphadenopathy. Review of the MIP images confirms the above findings. IMPRESSION: 1. Progressive ill-defined soft tissue infiltrating the left hilum and mediastinum with now complete or near  complete occlusion of the left lower lobe bronchus and lingular bronchus. This is favored to represent tumor. Bronchoscopy is recommended. Extensive postobstructive pneumonia, worsened since the prior study. 2. Stable to slightly larger mediastinal and hilar lymph nodes. 3. No CT findings for pulmonary embolism. 4. Stable emphysematous changes and pulmonary scarring. Aortic Atherosclerosis (ICD10-I70.0) and Emphysema (ICD10-J43.9). Electronically Signed   By: Marijo Sanes M.D.   On: 01/05/2018 13:54    ASSESSMENT / PLAN: Stage IV non-small cell lung cancer-now with tumor progression and near complete obstruction of left lower lobe with related postobstructive pneumonia. Post obstructive PNA  History of DVT/PE  Plan: Oncology (Dr. [name]) to see Agree with broad-spectrum antibiotics Sputum culture Blood cultures No role for bronchoscopy at this point as it would not change management. Don't think stenting is feasible as the obstruction is distal.   Agree with palliative care consult.  Code status is DNR.  Discussed with patient and wife.  PCCM will be available as needed. Please call with questions  Marshell Garfinkel MD [facility] Pager [phone] If  no answer or after 3pm call: [phone] 01/06/2018, 12:20 PM

## 2018-01-06 NOTE — Progress Notes (Signed)
  Radiation Oncology         [phone] ________________________________  Name: DARRIEN BELTER MRN: [id number]  Date: 01/06/2018  DOB: Dec 01, 1945  SIMULATION AND TREATMENT PLANNING NOTE - inpatient    ICD-10-CM   1. Adenocarcinoma of left lung, stage 4 (HCC) C34.92     DIAGNOSIS:  Stage IV (T1a, N3, M1b) non-small cell lung cancer, adenocarcinoma with PDL 1 expression of 80%, now with local progression    NARRATIVE:  The patient was brought to the [facility].  Identity was confirmed.  All relevant records and images related to the planned course of therapy were reviewed.  The patient freely provided informed written consent to proceed with treatment after reviewing the details related to the planned course of therapy. The consent form was witnessed and verified by the simulation staff.  Then, the patient was set-up in a stable reproducible  supine position for radiation therapy.  CT images were obtained.  Surface markings were placed.  The CT images were loaded into the planning software.  Then the target and avoidance structures were contoured.  Treatment planning then occurred.  The radiation prescription was entered and confirmed.  Then, I designed and supervised the construction of a total of 5 medically necessary complex treatment devices.  I have requested : 3D Simulation  I have requested a DVH of the following structures: GTV, heart, lungs, spinal cord.  I have ordered:dose calc.  PLAN:  The patient will receive 24 Gy in 8 fractions directed at the left hilar area/area of bronchial obstruction. Patient will proceed with his first treatment tomorrow as an inpatient.  -----------------------------------  Blair Promise, PhD, MD

## 2018-01-06 NOTE — Progress Notes (Signed)
PT Cancellation Note  Patient Details Name: ALIZE ACY MRN: [id number] DOB: 01/24/1946   Cancelled Treatment:    Reason Eval/Treat Not Completed: Patient at procedure or test/unavailable   Weston Anna, MPT Pager: [phone]

## 2018-01-06 NOTE — Telephone Encounter (Signed)
Records faxed.

## 2018-01-06 NOTE — Progress Notes (Signed)
DIAGNOSIS: Stage IV (T1a, N3, M1b) non-small cell lung cancer, adenocarcinoma with PDL 1 expression of 80%. 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  PRIOR THERAPY:  1) Treatment with immunotherapy with [name] (pembrolizumab) 200 MG IV every 3 weeks status post 6 cycles discontinued secondary to disease progression. 2) Systemic chemotherapy with carboplatin for AUC of 5, Alimta 500 MG/M2 and Avastin 15 MG/KG every 3 weeks. First dose 02/24/2017. Status post 6 cycles. Last dose was given 06/09/2017. 3) Maintenance treatment with Alimta 500 MG/M2 and Avastin 15 MG/KG every 3 weeks. First dose 07/21/2017. Status post 3 cycles. His discontinued secondary to disease progression. 4) palliative radiotherapy to the progressive left hilar soft tissue mass under the care of Dr. Sondra Come completed October 25, 2017.  CURRENT THERAPY: Systemic chemotherapy with docetaxel 75 mg/M2 and Cyramza 10 mg/KG every 3 weeks.  First dose November 25, 2017.  Status post 1 cycle.  Subjective: The patient is seen and examined today.  His wife was at the bedside.  This is a very pleasant 72 years old white male with a stage IV non-small cell lung cancer diagnosed in October 2017 status post treatment with immunotherapy followed by systemic chemotherapy with carboplatin, Alimta and Avastin with partial response followed by maintenance treatment with Alimta and Avastin discontinued secondary to disease progression.  The patient also has palliative radiotherapy to enlarging left hilar soft tissue mass completed in November 2018.  He was a started on systemic chemotherapy with docetaxel and Cyramza status post 1 cycle.  He was recently discharged from the hospital after treatment for postobstructive pneumonia.  He presented to the emergency department  yesterday complaining of left-sided chest pain as well as shortness of breath.  CT angiogram of the chest showed progressive ill-defined soft tissue infiltrating the left hilum and mediastinum with complete or near-complete occlusion of the left lower lobe bronchus and lingular bronchus.  There is favoring tumor progression.  There was also stable to slightly larger mediastinal and hilar lymph nodes.  The patient is currently undergoing treatment with IV Zosyn and vancomycin for the postobstructive pneumonia.  He is feeling a little bit better but continues to have shortness of breath with minimal exertion.  He denied having any current fever or chills.  He has no nausea, vomiting, diarrhea or constipation.  Objective: Vital signs in last 24 hours: Temp:  [98.2 F (36.8 C)-99 F (37.2 C)] 98.6 F (37 C) (02/07 0950) Pulse Rate:  [96-107] 107 (02/07 0950) Resp:  [20-30] 22 (02/07 0950) BP: (123-144)/(63-78) 144/72 (02/07 0950) SpO2:  [97 %-100 %] 98 % (02/07 0950) Weight:  [181 lb 3.5 oz (82.2 kg)] 181 lb 3.5 oz (82.2 kg) (02/06 1713)  Intake/Output from previous day: 02/06 0701 - 02/07 0700 In: 550 [IV Piggyback:550] Out: -  Intake/Output this shift: Total I/O In: 480 [P.O.:480] Out: -   General appearance: alert, cooperative, fatigued and no distress Resp: diminished breath sounds LLL and dullness to percussion LLL Cardio: regular rate and rhythm, S1, S2 normal, no murmur, click, rub or gallop GI: soft, non-tender; bowel sounds normal; no masses,  no organomegaly Extremities: extremities normal, atraumatic, no cyanosis or edema  Lab Results:  Recent Labs    01/05/18 1026 01/05/18 1046 01/06/18 0904  WBC 20.7*  --  15.1*  HGB 10.8* 10.5* 9.3*  HCT 34.1* 31.0* 30.4*  PLT 126*  --  121*   BMET Recent Labs    01/05/18 1026 01/05/18 1046 01/06/18 0904  NA 139 139 136  K 3.3* 3.4* 3.3*  CL 103 102 106  CO2 25  --  22  GLUCOSE 163* 164* 208*  BUN 16 15 14   CREATININE  1.07 0.90 0.88  CALCIUM 8.3*  --  8.0*    Studies/Results: Dg Chest 2 View  Result Date: 01/05/2018 CLINICAL DATA:  Shortness of breath. Stage IV lung cancer. Pneumonia. EXAM: CHEST  2 VIEW COMPARISON:  Two-view chest x-ray and CT of the chest 12/14/2017. FINDINGS: Heart size is normal. A right-sided PICC line terminates in the distal SVC. The right lung is clear. Progressive left lower lobe airspace disease versus mass lesion is noted. Left upper lobe is clear. IMPRESSION: 1. Progressive left lower lobe airspace opacity. This likely represents post obstructive disease or possibly lobar pneumonia in the setting of known neoplasm. 2. Right-sided PICC line in satisfactory position. Electronically Signed   By: San Morelle M.D.   On: 01/05/2018 10:16   Ct Angio Chest Pe W And/or Wo Contrast  Result Date: 01/05/2018 CLINICAL DATA:  Chest pain and shortness of breath. Cough. History of lung cancer. EXAM: CT ANGIOGRAPHY CHEST WITH CONTRAST TECHNIQUE: Multidetector CT imaging of the chest was performed using the standard protocol during bolus administration of intravenous contrast. Multiplanar CT image reconstructions and MIPs were obtained to evaluate the vascular anatomy. CONTRAST:  52mL ISOVUE-370 IOPAMIDOL (ISOVUE-370) INJECTION 76% COMPARISON:  CT scan 12/14/2017 FINDINGS: Cardiovascular: The heart is normal in size and stable. No pericardial effusion. Stable mild tortuosity and atherosclerotic changes involving the thoracic aorta. No dissection. The branch vessels are patent. Stable scattered coronary artery calcifications. The pulmonary arterial tree is fairly well opacified. No filling defects to suggest pulmonary embolism. Mediastinum/Nodes: Persistent enlarged mediastinal and hilar lymph nodes. Infiltrating soft tissue in the left hilum is progressive and there is complete or near complete occlusion of the left lower lobe bronchus and also the lingular bronchus. Lungs/Pleura: Further narrowing of  the right lower lobe bronchus with progressive dense airspace consolidation in the right lobe. Findings suspicious for progressive tumor and postobstructive pneumonia. There is a new left pleural effusion. Several ill-defined nodular airspace opacities in the lingula are likely nodular infiltrates. Stable underlying emphysema. No metastatic nodules in the right lung. Upper Abdomen: No significant upper abdominal findings. No obvious hepatic metastatic disease. Musculoskeletal: No significant bony findings. No chest wall mass, supraclavicular or axillary lymphadenopathy. Review of the MIP images confirms the above findings. IMPRESSION: 1. Progressive ill-defined soft tissue infiltrating the left hilum and mediastinum with now complete or near complete occlusion of the left lower lobe bronchus and lingular bronchus. This is favored to represent tumor. Bronchoscopy is recommended. Extensive postobstructive pneumonia, worsened since the prior study. 2. Stable to slightly larger mediastinal and hilar lymph nodes. 3. No CT findings for pulmonary embolism. 4. Stable emphysematous changes and pulmonary scarring. Aortic Atherosclerosis (ICD10-I70.0) and Emphysema (ICD10-J43.9). Electronically Signed   By: Marijo Sanes M.D.   On: 01/05/2018 13:54    Medications: I have reviewed the patient's current medications.  CODE STATUS: No CODE BLUE  Assessment/Plan: This is a very pleasant 72 years old white male with metastatic non-small cell lung cancer, adenocarcinoma status post several chemotherapy and immunotherapy regimens as well as palliative radiation.  The patient was undergoing systemic chemotherapy with docetaxel and Cyramza status post 1 cycle but his treatment has been on hold secondary to recent hospitalization and treatment for  postobstructive pneumonia. He presented today with worsening shortness of breath and CT scan of the chest showed progressive soft tissue mass in the left hilum and mediastinum with  almost complete occlusion of the left lower lobe bronchus and lingular bronchus. I had a lengthy discussion with the patient and his wife today about his current condition and treatment options.  I discussed with the patient the option of re-treatment with radiation if possible by Dr. Sondra Come and if he has improvement of his condition and resolution of the postobstructive pneumonia, I may consider him for resuming his systemic chemotherapy. I also discussed with the patient the option of palliative care and hospice referral taking into consideration his worsening condition and frequent postobstructive pneumonia. The patient and his wife would like to talk to Dr. Emeline Gins before making a decision but they are leaning towards consideration of palliative care at this point. For the postobstructive pneumonia, continue his current IV antibiotic with Zosyn and vancomycin. Thank you for taking good care of Mr. Veals, I will continue to follow-up the patient with you and assist in his management on an as-needed basis.   LOS: 1 day    Eilleen Kempf 01/06/2018

## 2018-01-06 NOTE — Progress Notes (Signed)
Radiation Oncology         [phone] ________________________________  Name: Nicholas Hale MRN: [id number]  Date: 01/06/2018  DOB: 1946/05/10  Follow-Up Visit/ reevaluation  Note - inpatient  CC: Nicholas Hale, Nicholas Salinas, MD  Nicholas Bears, MD    ICD-10-CM   1. Adenocarcinoma of left lung, stage 4 (HCC) C34.92     Diagnosis:   Stage IV (T1a, N3, M1b) non-small cell lung cancer, adenocarcinoma with PDL 1 expression of 80%, now with local progression    Interval Since Last Radiation:  2 months   Site/dose:  Chest-Lung_Lt, 35 Gy delivered in 14 fractions   Beams/energy:  3D Photon, 10X/6X  Radiation treatment dates:   10/05/17-10/25/17    Narrative:  He presented to the emergency department yesterday complaining of left-sided chest pain as well as shortness of breath.  CT angiogram of the chest showed progressive ill-defined soft tissue infiltrating the left hilum and mediastinum with complete or near-complete occlusion of the left lower lobe bronchus and lingular bronchus.  There is favoring tumor progression.  There was also stable to slightly larger mediastinal and hilar lymph nodes.  The patient is currently undergoing treatment with IV Zosyn and vancomycin for the postobstructive pneumonia.  He is feeling a little bit better but continues to have shortness of breath with minimal exertion.  He denied having any current fever or chills.  He has no nausea, vomiting, diarrhea or constipation. In light of the near collapse of the left lower lobe bronchus radiation therapy is been consulted for consideration for additional palliative radiation therapy directed at the left hilar region                             ALLERGIES:  is allergic to lidocaine.  Meds: No current facility-administered medications for this encounter.    No current outpatient medications on file.   Facility-Administered Medications Ordered in Other Encounters  Medication Dose Route Frequency Provider Last  Rate Last Dose  . acetaminophen (TYLENOL) tablet 650 mg  650 mg Oral Q6H PRN Nicholas Pour, MD       Or  . acetaminophen (TYLENOL) suppository 650 mg  650 mg Rectal Q6H PRN Nicholas Pour, MD      . albuterol (PROVENTIL) (2.5 MG/3ML) 0.083% nebulizer solution 3 mL  3 mL Inhalation Q4H PRN Nicholas Pour, MD      . atorvastatin (LIPITOR) tablet 20 mg  20 mg Oral q1800 Nicholas Pour, MD   20 mg at 01/06/18 1808  . dextromethorphan-guaiFENesin (MUCINEX DM) 30-600 MG per 12 hr tablet 2 tablet  2 tablet Oral BID Nicholas Filler, MD   2 tablet at 01/06/18 1353  . feeding supplement (ENSURE ENLIVE) (ENSURE ENLIVE) liquid 237 mL  237 mL Oral BID BM Nicholas Hale B, MD   237 mL at 01/06/18 0914  . heparin lock flush 100 unit/mL  500 Units Intravenous Once PRN Nicholas Bears, MD      . heparin lock flush 100 unit/mL  250 Units Intracatheter Daily Nicholas Pour, MD       And  . heparin lock flush 100 unit/mL  250 Units Intracatheter PRN Nicholas Pour, MD      . HYDROcodone-homatropine [facility]) 5-1.5 MG/5ML syrup 5 mL  5 mL Oral Q6H PRN Nicholas Filler, MD   5 mL at 01/06/18 1353  . oxyCODONE (ROXICODONE) 5 MG/5ML solution 5 mg  5 mg Oral Q4H  PRN Nicholas Pour, MD   5 mg at 01/06/18 1559  . oxyCODONE-acetaminophen (PERCOCET/ROXICET) 5-325 MG per tablet 1 tablet  1 tablet Oral Once Nicholas Ferguson, MD   Stopped at 01/05/18 [phone]  . piperacillin-tazobactam (ZOSYN) IVPB 3.375 g  3.375 g Intravenous Q8H Nicholas Hale, Nicholas Hale, Nicholas Hale 12.5 mL/hr at 01/06/18 1809 3.375 g at 01/06/18 1809  . polyethylene glycol (MIRALAX / GLYCOLAX) packet 17 g  17 g Oral Daily PRN Nicholas Pour, MD      . prochlorperazine (COMPAZINE) tablet 10 mg  10 mg Oral Q6H PRN Nicholas Pour, MD      . rivaroxaban (XARELTO) tablet 20 mg  20 mg Oral q1800 Nicholas Pour, MD   20 mg at 01/06/18 1808  . senna-docusate (Senokot-S) tablet 1 tablet  1 tablet Oral QHS Nicholas Pour, MD   1 tablet at 01/05/18 2124  . sodium chloride (OCEAN) 0.65 % nasal  spray 2 spray  2 spray Each Nare PRN Nicholas Hale B, MD      . sodium chloride flush (NS) 0.9 % injection 10 mL  10 mL Intravenous PRN Nicholas Bears, MD   10 mL at 08/11/17 0940  . sodium chloride flush (NS) 0.9 % injection 10 mL  10 mL Intravenous PRN Nicholas Bears, MD      . sucralfate (CARAFATE) 1 GM/10ML suspension 1 g  1 g Oral TID WC & HS Nicholas Pour, MD   1 g at 01/06/18 1808  . vancomycin (VANCOCIN) IVPB 1000 mg/200 mL premix  1,000 mg Intravenous Q12H Nicholas Hale, Nicholas Hale 200 mL/hr at 01/06/18 1809 1,000 mg at 01/06/18 1308    Physical Findings: The patient is in no acute distress. Patient is alert and oriented. Oxygen in place by nasal cannula, 3 L. The patient is resting comfortably in his hospital bed. He is accompanied by his wife and daughter on evaluation this afternoon. Resp: diminished breath sounds LLL Cardio: regular rate and rhythm,  GI: soft, non-tender; bowel sounds normal; no masses,  no organomegaly     Lab Findings: Lab Results  Component Value Date   WBC 15.1 (H) 01/06/2018   HGB 9.3 (L) 01/06/2018   HCT 30.4 (L) 01/06/2018   MCV 104.1 (H) 01/06/2018   PLT 121 (L) 01/06/2018    Radiographic Findings: Nicholas Hale  Result Date: 01/05/2018 CLINICAL DATA:  Shortness of breath. Stage IV lung cancer. Pneumonia. EXAM: CHEST  2 Hale COMPARISON:  [facility] chest x-ray and CT of the chest 12/14/2017. FINDINGS: Heart size is normal. A right-sided PICC line terminates in the distal SVC. The right lung is clear. Progressive left lower lobe airspace disease versus mass lesion is noted. Left upper lobe is clear. IMPRESSION: 1. Progressive left lower lobe airspace opacity. This likely represents post obstructive disease or possibly lobar pneumonia in the setting of known neoplasm. 2. Right-sided PICC line in satisfactory position. Electronically Signed   By: Nicholas Hale M.D.   On: 01/05/2018 10:16   Nicholas Hale  Result Date: 12/14/2017 CLINICAL DATA:   Cough and shortness of breath. EXAM: CHEST  2 Hale COMPARISON:  CT chest dated November 24, 2017. Chest x-ray dated August 21, 2017. FINDINGS: Unchanged right chest wall port catheter with the tip in the mid SVC. The heart size and mediastinal contours are within normal limits. Normal pulmonary vascularity. Left basilar atelectasis. No focal consolidation, pleural effusion, or pneumothorax. No acute osseous abnormality. IMPRESSION: Left basilar atelectasis.  No active cardiopulmonary disease.  Electronically Signed   By: Titus Dubin M.D.   On: 12/14/2017 17:01   Nicholas Abd 1 Hale  Result Date: 12/09/2017 CLINICAL DATA:  Abdominal distention.  Constipation. EXAM: ABDOMEN - 1 Hale COMPARISON:  CT abdomen and pelvis November 24, 2017 FINDINGS: There is moderate stool in the colon. There is no appreciable bowel dilatation or air-fluid level to suggest bowel obstruction. No free air. There are soft tissue masses in the right mid abdomen which CT demonstrates as representing large right renal cysts. Lung bases are clear. There are small phleboliths in the pelvis. 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 IMPRESSION: No bowel obstruction or free air. Moderate stool in colon. Soft tissue masses right mid abdomen demonstrated by CT to represent large renal cysts. Total hip replacements bilaterally.  Lung bases clear. Electronically Signed   By: Lowella Grip III M.D.   On: 12/09/2017 14:05   Ct Angio Chest Pe W And/or Wo Contrast  Result Date: 01/05/2018 CLINICAL DATA:  Chest pain and shortness of breath. Cough. History of lung cancer. EXAM: CT ANGIOGRAPHY CHEST WITH CONTRAST TECHNIQUE: Multidetector CT imaging of the chest was performed using the standard protocol during bolus administration of intravenous contrast. Multiplanar CT image reconstructions and MIPs were obtained to evaluate the vascular anatomy. CONTRAST:  17mL ISOVUE-370 IOPAMIDOL (ISOVUE-370) INJECTION 76% COMPARISON:  CT scan 12/14/2017  FINDINGS: Cardiovascular: The heart is normal in size and stable. No pericardial effusion. Stable mild tortuosity and atherosclerotic changes involving the thoracic aorta. No dissection. The branch vessels are patent. Stable scattered coronary artery calcifications. The pulmonary arterial tree is fairly well opacified. No filling defects to suggest pulmonary embolism. Mediastinum/Nodes: Persistent enlarged mediastinal and hilar lymph nodes. Infiltrating soft tissue in the left hilum is progressive and there is complete or near complete occlusion of the left lower lobe bronchus and also the lingular bronchus. Lungs/Pleura: Further narrowing of the right lower lobe bronchus with progressive dense airspace consolidation in the right lobe. Findings suspicious for progressive tumor and postobstructive pneumonia. There is a new left pleural effusion. Several ill-defined nodular airspace opacities in the lingula are likely nodular infiltrates. Stable underlying emphysema. No metastatic nodules in the right lung. Upper Abdomen: No significant upper abdominal findings. No obvious hepatic metastatic disease. Musculoskeletal: No significant bony findings. No chest wall mass, supraclavicular or axillary lymphadenopathy. Review of the MIP images confirms the above findings. IMPRESSION: 1. Progressive ill-defined soft tissue infiltrating the left hilum and mediastinum with now complete or near complete occlusion of the left lower lobe bronchus and lingular bronchus. This is favored to represent tumor. Bronchoscopy is recommended. Extensive postobstructive pneumonia, worsened since the prior study. 2. Stable to slightly larger mediastinal and hilar lymph nodes. 3. No CT findings for pulmonary embolism. 4. Stable emphysematous changes and pulmonary scarring. Aortic Atherosclerosis (ICD10-I70.0) and Emphysema (ICD10-J43.9). Electronically Signed   By: Marijo Sanes M.D.   On: 01/05/2018 13:54   Ct Angio Chest Pe W/cm &/or Wo  Cm  Result Date: 12/14/2017 CLINICAL DATA:  Weakness over the past few days. States he had chemo 3 weeks ago and since then he has lost 15 lbs. Has reportedly had multiple reactions to the chemo. Stage 4 ca EXAM: CT ANGIOGRAPHY CHEST WITH CONTRAST TECHNIQUE: Multidetector CT imaging of the chest was performed using the standard protocol during bolus administration of intravenous contrast. Multiplanar CT image reconstructions and MIPs were obtained to evaluate the vascular anatomy. CONTRAST:  39mL ISOVUE-370 IOPAMIDOL (ISOVUE-370) INJECTION 76% COMPARISON:  CT chest, abdomen and pelvis,  11/24/2017. Current chest radiograph. FINDINGS: Cardiovascular: Contrast opacification of the pulmonary arteries is suboptimal, particularly to the mid to lower lung segmental and subsegmental vessels. This limits assessment for pulmonary emboli. Allowing for this, there is no evidence of a central pulmonary embolus. Heart is normal in size and configuration. No pericardial effusion. There are three-vessel coronary artery calcifications. The thoracic aorta is normal in caliber. No dissection. Mild atherosclerosis is noted along the aortic arch and descending thoracic aorta. Mediastinum/Nodes: Several prominent lymph nodes. There is a left paratracheal superior mediastinal node measuring 8 mm in short axis. There are anterior mediastinal lymph nodes, largest measuring 1.6 x 1.0 cm, lying adjacent to the left brachiocephalic vein. Allowing for differences in measurement technique, these are stable from the prior CT. There is increased soft tissue along the left hilar structures that is also stable. Trachea is widely patent. Esophagus is unremarkable. Lungs/Pleura: There is consolidation in the posteromedial left lower lobe. The dense consolidation is surrounded by hazy ground-glass opacity. There is bronchial wall thickening of the lower lobe segmental bronchi. There is a smaller area of consolidation in the posterolateral left upper  lobe centered on image 81, series 10. There small nodular components to this. There is a spiculated nodule in the subpleural left lower lobe measuring 10 x 8 cm, which was present previously. There are no other discrete lung nodules. There are no other areas of consolidation. There are stable areas of peripheral interstitial thickening/scarring as well as upper lobe predominant emphysema. Mild subsegmental atelectasis is noted in the right lower lobe. No pleural effusion.  No pneumothorax. Upper Abdomen: No acute findings. Musculoskeletal: No fracture or acute finding. No osteoblastic or osteolytic lesions. Review of the MIP images confirms the above findings. IMPRESSION: 1. Suboptimal exam limiting assessment for smaller pulmonary emboli. Allowing for this, there is no evidence of a pulmonary embolus. 2. Extensive left lower lobe consolidation consistent with pneumonia. 3. Findings of metastatic carcinoma noted on the prior chest, abdomen and pelvis CT are similar. Aortic Atherosclerosis (ICD10-I70.0) and Emphysema (ICD10-J43.9). Electronically Signed   By: Lajean Manes M.D.   On: 12/14/2017 19:56   Ir Removal [employer] W/ [place] W/o Fl Mod Sed  Result Date: 12/17/2017 INDICATION: 72 year old with pneumonia and bacteremia. Request for removal of the right jugular Port-A-Cath. Port-A-Cath was placed on 02/01/2017 by Dr. Vernard Gambles. EXAM: REMOVAL RIGHT IJ VEIN PORT-A-CATH MEDICATIONS: Ancef 2 g; The antibiotic was administered within an appropriate time interval prior to skin puncture. ANESTHESIA/SEDATION: Moderate (conscious) sedation was employed during this procedure. A total of Versed 1.0 mg and Fentanyl 50 mcg was administered intravenously. Moderate Sedation Time: 23 minutes. The patient's level of consciousness and vital signs were monitored continuously by radiology nursing throughout the procedure under my direct supervision. FLUOROSCOPY TIME:  None COMPLICATIONS: None immediate. PROCEDURE: Informed written  consent was obtained from the patient after a thorough discussion of the procedural risks, benefits and alternatives. All questions were addressed. Maximal Sterile Barrier Technique was utilized including caps, mask, sterile gowns, sterile gloves, sterile drape, hand hygiene and skin antiseptic. A timeout was performed prior to the initiation of the procedure. The right chest was prepped and draped in a sterile fashion. 1% Nesacaine was utilized for local anesthesia because of patient's lidocaine allergy. An incision was made over the previously healed surgical incision. Utilizing blunt dissection, the port catheter and reservoir were removed from the underlying subcutaneous tissue in their entirety. Port pocket was healthy looking. The pocket was irrigated with a copious amount  of sterile normal saline. The pocket was closed with interrupted 3-0 Vicryl stitches. A 4-0 Vicryl running subcuticular stitch was utilized to approximate the skin. Dermabond was applied. IMPRESSION: Successful right IJ vein Port-A-Cath explant. Electronically Signed   By: Markus Daft M.D.   On: 12/17/2017 15:49    Impression:  Patient has developed additional progression in his left hilar area resulting in near complete left lower lung collapse.  I carefully reviewed the patient's previous radiation fields and he would be a candidate for additional palliative radiation therapy directed to this area. Patient previously received 35 gray and therefore would be at candidate for an additional 25-30 gray. I discussed the course of treatment side effects and potential toxicities of radiation therapy in this situation with the patient and his family. Another option at this point would be to consider hospice/palliative care but the patient does wish to proceed with radiation therapy at this time.  Plan:  This will be brought down for simulation and treatment this afternoon. After planning is complete he will proceed with this first treatment  tomorrow as an inpatient.  ____________________________________ Gery Pray, MD

## 2018-01-07 ENCOUNTER — Ambulatory Visit
Admit: 2018-01-07 | Discharge: 2018-01-07 | Disposition: A | Discharge status: Home / Self Care | Payer: Medicare Other | Attending: Radiation Oncology | Admitting: Radiation Oncology

## 2018-01-07 DIAGNOSIS — Z515 Encounter for palliative care: Secondary | ICD-10-CM

## 2018-01-07 LAB — BASIC METABOLIC PANEL
ANION GAP: 7 (ref 5–15)
BUN: 15 mg/dL (ref 6–20)
CHLORIDE: 104 mmol/L (ref 101–111)
CO2: 24 mmol/L (ref 22–32)
Calcium: 8 mg/dL — ABNORMAL LOW (ref 8.9–10.3)
Creatinine, Ser: 0.9 mg/dL (ref 0.61–1.24)
Glucose, Bld: 163 mg/dL — ABNORMAL HIGH (ref 65–99)
POTASSIUM: 3.4 mmol/L — AB (ref 3.5–5.1)
SODIUM: 135 mmol/L (ref 135–145)

## 2018-01-07 LAB — CBC WITH DIFFERENTIAL/PLATELET
BASOS ABS: 0 10*3/uL (ref 0.0–0.1)
Basophils Relative: 0 %
Eosinophils Absolute: 0 10*3/uL (ref 0.0–0.7)
Eosinophils Relative: 0 %
HCT: 28.9 % — ABNORMAL LOW (ref 39.0–52.0)
Hemoglobin: 9 g/dL — ABNORMAL LOW (ref 13.0–17.0)
LYMPHS ABS: 0.8 10*3/uL (ref 0.7–4.0)
LYMPHS PCT: 5 %
MCH: 31.9 pg (ref 26.0–34.0)
MCHC: 31.1 g/dL (ref 30.0–36.0)
MCV: 102.5 fL — AB (ref 78.0–100.0)
Monocytes Absolute: 1.1 10*3/uL — ABNORMAL HIGH (ref 0.1–1.0)
Monocytes Relative: 7 %
NEUTROS ABS: 13.3 10*3/uL — AB (ref 1.7–7.7)
NEUTROS PCT: 88 %
Platelets: 123 10*3/uL — ABNORMAL LOW (ref 150–400)
RBC: 2.82 MIL/uL — AB (ref 4.22–5.81)
RDW: 18.6 % — ABNORMAL HIGH (ref 11.5–15.5)
WBC: 15.3 10*3/uL — AB (ref 4.0–10.5)

## 2018-01-07 LAB — MAGNESIUM: Magnesium: 1.9 mg/dL (ref 1.7–2.4)

## 2018-01-07 MED ORDER — IPRATROPIUM BROMIDE 0.02 % IN SOLN
0.5000 mg | Freq: Four times a day (QID) | RESPIRATORY_TRACT | Status: DC
  Administered 2018-01-08 (×4): 0.5 mg via RESPIRATORY_TRACT
  Filled 2018-01-07 (×5): qty 2.5

## 2018-01-07 MED ORDER — IPRATROPIUM BROMIDE 0.02 % IN SOLN: 0.5000 mg | RESPIRATORY_TRACT | Status: DC | PRN

## 2018-01-07 MED ORDER — BUDESONIDE 0.5 MG/2ML IN SUSP
0.5000 mg | Freq: Two times a day (BID) | RESPIRATORY_TRACT | Status: DC
  Administered 2018-01-08 – 2018-01-12 (×10): 0.5 mg via RESPIRATORY_TRACT
  Filled 2018-01-07 (×12): qty 2

## 2018-01-07 MED ORDER — LEVALBUTEROL HCL 0.63 MG/3ML IN NEBU
0.6300 mg | INHALATION_SOLUTION | Freq: Four times a day (QID) | RESPIRATORY_TRACT | Status: DC
  Administered 2018-01-08 (×4): 0.63 mg via RESPIRATORY_TRACT
  Filled 2018-01-07 (×5): qty 3

## 2018-01-07 MED ORDER — LEVALBUTEROL HCL 0.63 MG/3ML IN NEBU: 0.6300 mg | INHALATION_SOLUTION | RESPIRATORY_TRACT | Status: DC | PRN

## 2018-01-07 MED ORDER — LORAZEPAM 1 MG PO TABS
1.0000 mg | ORAL_TABLET | Freq: Every day | ORAL | Status: DC
  Administered 2018-01-07 – 2018-01-08 (×2): 1 mg via ORAL
  Filled 2018-01-07 (×2): qty 1

## 2018-01-07 MED ORDER — ALBUTEROL SULFATE (2.5 MG/3ML) 0.083% IN NEBU
2.5000 mg | INHALATION_SOLUTION | RESPIRATORY_TRACT | Status: DC | PRN
  Administered 2018-01-07: 2.5 mg via RESPIRATORY_TRACT
  Filled 2018-01-07: qty 3

## 2018-01-07 MED ORDER — POTASSIUM CHLORIDE CRYS ER 20 MEQ PO TBCR
40.0000 meq | EXTENDED_RELEASE_TABLET | Freq: Once | ORAL | Status: AC
  Administered 2018-01-07: 40 meq via ORAL
  Filled 2018-01-07: qty 2

## 2018-01-07 NOTE — Progress Notes (Signed)
Chest Vest held at this time.  RN gave Ativan so pt is resting comfortably at this time.  RT to monitor and assess as needed.

## 2018-01-07 NOTE — Progress Notes (Signed)
PROGRESS NOTE    Nicholas Hale  FOY:[id number] DOB: 28-Oct-1946 DOA: 01/05/2018 PCP: [name], MD    Brief Narrative:   Nicholas Hale is a 72 y.o. male with a history of DVT/PE on xarelto and stage IV NSCLC with recent admission for pneumonia and MSSA bacteremia discharged 1/25 on ancef thru PICC through 2/2. After initial improvement he noted constant, progressive, now severe dyspnea associated with increasing weakness and nonproductive cough with pleuritic chest pain. No fevers. He fell due to weakness getting into bed this morning prompting return to ED where he was ill-appearing, dyspneic and placed on oxygen. CXR suggested progressive LLL opacity, confirmed by CTA chest to be progressive airspace disease beyond narrowed LLL and lingular bronchi narrowed by hilar mass. Blood cultures drawn, vancomycin and zosyn were started, and hospitalists called to admit for postobstructive pneumonia. The patient's oncologist, Dr. [name] was called for consult.     Assessment & Plan:   Principal Problem:   Postobstructive pneumonia Active Problems:   Adenocarcinoma of left lung, stage 4 (HCC)   Thrombocytopenia (HCC)   Anemia   Sepsis due to pneumonia ([place])   Hypokalemia   Elevated TSH   Encounter for palliative care  1 sepsis secondary to postobstructive left lower lobe pneumonia Patient on admission met criteria for sepsis secondary to tachycardia, tachypnea, leukocytosis, CT scan concerning for postobstructive pneumonia with progression of lung disease in the left hilar region resulting in near complete left lower lung obstruction/collapse.  Patient currently afebrile.  MRSA PCR negative.  Blood cultures pending.  Sputum Gram stain and cultures pending.  This patient on scheduled Pulmicort, scheduled Xopenex and Atrovent nebs as well as as needed nebs.  Continue Mucinex.  Continue empiric IV Zosyn.  Continue IV vancomycin.  Patient has been seen in consultation by pulmonary, Dr.  [name] who feels no role for bronchoscopy at this time and recommending continued IV antibiotics, palliative care consultation, consultation with oncology and rad/onc for possible palliative radiation.  Patient has been seen by radiation oncology who recommended some palliative radiation to be started today 01/07/2018.  Patient has also been seen by his oncologist Dr. [name] who is in agreement with retreatment with radiation if possible and if there is improvement in patient's condition may consider resuming systemic chemotherapy.  Appreciate pulmonary's input and recommendations.  Oncology and radial oncology following.  2.  Stage IV non-small cell lung cancer: Status post XRT completed November 2018 and last chemotherapy December 2018. Patient with history of malignant pericardial effusion.  No current effusion noted at this time.  Patient with progression of disease leading to postobstructive pneumonia.  Patient has been seen by radiation oncology who are planning palliative radiation starting today.  Patient has also been seen by his oncologist, Dr. [name] who is in agreement with retreatment with radiation if possible and if there is improvement in patient's condition may consider resuming his systemic chemotherapy.  Palliative care has assessed the patient, family at this time wanting to proceed with palliative radiation first prior to deciding as to whether to shift focus to full comfort.  Patient for first palliative radiation today.  Appreciate palliative care input.   3.  History of DVT/PE CT angiogram chest negative for any acute PE.  Patient with no overt bleeding.  Continue anticoagulation with Xarelto.  Follow.  4.  Thrombocytopenia Stable.  No bleeding noted.  Monitor.  5.  Anemia of chronic disease Hemoglobin currently stable at 9.0.  Transfuse for hemoglobin less  than 7.    6.  Elevated TSH During recent admission TSH was 17 with a normal free T4.  Patient will need  repeat thyroid function studies done in about 4-6 weeks I will follow-up with PCP.  7.  Hypocalcemia Patient given a dose of calcium gluconate on admission.   8.  Hypokalemia/hypomagnesemia Replete.    DVT prophylaxis: xarelto Code Status: DNR Family Communication: updated patient and wife at bedside. Disposition Plan: To be determined.  Likely back home.   Consultants:   Pulmonary: Dr. [name] 01/06/2018  Oncology: Dr. [name] 01/06/2018  Radiation oncology  Palliative care: Dr.Anwar 01/07/2018  Procedures:   CT angiogram chest 01/05/2018  Chest x-ray 01/05/2018    Antimicrobials:   IV Zosyn 01/05/2018  IV vancomycin 01/05/2018>>>>> 01/07/2018   Subjective: Patient sitting up side of the bed.  Patient states had breathing treatment that helped with breathing today.  No change with cough.  No chest pain.  Awaiting radiation treatment.    Objective: Vitals:   01/06/18 1330 01/06/18 2141 01/07/18 0540 01/07/18 0543  BP: (!) 125/59 (!) 142/61 132/76   Pulse: (!) 113 (!) 113 (!) 113   Resp: (!) 21 20 (!) 21   Temp: 98.4 F (36.9 C) 98.4 F (36.9 C) 98.4 F (36.9 C)   TempSrc: Oral Oral Oral   SpO2: 99% 95% 97% 97%  Weight:      Height:        Intake/Output Summary (Last 24 hours) at 01/07/2018 1128 Last data filed at 01/07/2018 0906 Gross per 24 hour  Intake 1470 ml  Output -  Net 1470 ml   Filed Weights   01/05/18 1713  Weight: 82.2 kg (181 lb 3.5 oz)    Examination:  General exam: Appears calm and comfortable  Respiratory system: Poor to fair air movement.  Some scattered coarse breath sounds.  No crackles.  No wheezing.  No use of accessory muscles of respiration. Respiratory effort normal. Cardiovascular system: Regular rate and rhythm no murmurs rubs or gallops.  No lower extremity edema.  No JVD.  Gastrointestinal system: Abdomen is soft, nontender, nondistended, positive bowel sounds.  No hepatosplenomegaly.  Central nervous system: Alert and  oriented. No focal neurological deficits. Extremities: Symmetric 5 x 5 power. Skin: No rashes, lesions or ulcers Psychiatry: Judgement and insight appear normal. Mood & affect appropriate.     Data Reviewed: I have personally reviewed following labs and imaging studies  CBC: Recent Labs  Lab 01/05/18 1026 01/05/18 1046 01/06/18 0904 01/07/18 0634  WBC 20.7*  --  15.1* 15.3*  NEUTROABS 17.9*  --  13.5* 13.3*  HGB 10.8* 10.5* 9.3* 9.0*  HCT 34.1* 31.0* 30.4* 28.9*  MCV 102.4*  --  104.1* 102.5*  PLT 126*  --  121* 130*   Basic Metabolic Panel: Recent Labs  Lab 01/05/18 1026 01/05/18 1046 01/06/18 0904 01/07/18 0634  NA 139 139 136 135  K 3.3* 3.4* 3.3* 3.4*  CL 103 102 106 104  CO2 25  --  22 24  GLUCOSE 163* 164* 208* 163*  BUN _0 CREATININE 1.07 0.90 0.88 0.90  CALCIUM 8.3*  --  8.0* 8.0*  MG  --   --  1.5* 1.9   GFR: Estimated Creatinine Clearance: 77.7 mL/min (by C-G formula based on SCr of 0.9 mg/dL). Liver Function Tests: Recent Labs  Lab 01/05/18 1026  AST 63*  ALT 19  ALKPHOS 89  BILITOT 0.5  PROT 6.7  ALBUMIN 2.1*  No results for input(s): LIPASE, AMYLASE in the last 168 hours. No results for input(s): AMMONIA in the last 168 hours. Coagulation Profile: No results for input(s): INR, PROTIME in the last 168 hours. Cardiac Enzymes: No results for input(s): CKTOTAL, CKMB, CKMBINDEX, TROPONINI in the last 168 hours. BNP (last 3 results) No results for input(s): PROBNP in the last 8760 hours. HbA1C: No results for input(s): HGBA1C in the last 72 hours. CBG: No results for input(s): GLUCAP in the last 168 hours. Lipid Profile: No results for input(s): CHOL, HDL, LDLCALC, TRIG, CHOLHDL, LDLDIRECT in the last 72 hours. Thyroid Function Tests: No results for input(s): TSH, T4TOTAL, FREET4, T3FREE, THYROIDAB in the last 72 hours. Anemia Panel: No results for input(s): VITAMINB12, FOLATE, FERRITIN, TIBC, IRON, RETICCTPCT in the last 72  hours. Sepsis Labs: Recent Labs  Lab 01/05/18 1046 01/05/18 1525  LATICACIDVEN 1.09 0.99    Recent Results (from the past 240 hour(s))  Blood Culture (routine x 2)     Status: None (Preliminary result)   Collection Time: 01/05/18 10:26 AM  Result Value Ref Range Status   Specimen Description   Final    BLOOD LEFT ANTECUBITAL Performed at [facility] [address], [place], [place] [address]    Special Requests   Final    BOTTLES DRAWN AEROBIC AND ANAEROBIC Blood Culture adequate volume Performed at [facility] [address] [place], [place] [address]    Culture   Final    NO GROWTH 1 DAY Performed at [facility], [place] [address], [place], [place] [address]    Report Status PENDING  Incomplete  Blood Culture (routine x 2)     Status: None (Preliminary result)   Collection Time: 01/05/18 10:30 AM  Result Value Ref Range Status   Specimen Description   Final    BLOOD LEFT WRIST Performed at [facility] [address], [place], [place] [address]    Special Requests   Final    BOTTLES DRAWN AEROBIC AND ANAEROBIC Blood Culture adequate volume Performed at [facility] [address], [place], [place] [address]    Culture   Final    NO GROWTH 1 DAY Performed at [facility], [place] [address] [place], [place] [address]    Report Status PENDING  Incomplete  MRSA PCR Screening     Status: None   Collection Time: 01/06/18 12:08 PM  Result Value Ref Range Status   MRSA by PCR NEGATIVE NEGATIVE Final    Comment:        The GeneXpert MRSA Assay (FDA approved for NASAL specimens only), is one component of a comprehensive MRSA colonization surveillance program. It is not intended to diagnose MRSA infection nor to guide or monitor treatment for MRSA infections. Performed at [facility], [place] [address] [place], [place] [address]          Radiology  Studies: Ct Angio Chest Pe W And/or Wo Contrast  Result Date: 01/05/2018 CLINICAL DATA:  Chest pain and shortness of breath. Cough. History of lung cancer. EXAM: CT ANGIOGRAPHY CHEST WITH CONTRAST TECHNIQUE: Multidetector CT imaging of the chest was performed using the standard protocol during bolus administration of intravenous contrast. Multiplanar CT image reconstructions and MIPs were obtained to evaluate the vascular anatomy. CONTRAST:  60m ISOVUE-370 IOPAMIDOL (ISOVUE-370) INJECTION 76% COMPARISON:  CT scan 12/14/2017 FINDINGS: Cardiovascular: The heart is normal in size and stable. No pericardial effusion. Stable mild tortuosity  and atherosclerotic changes involving the thoracic aorta. No dissection. The branch vessels are patent. Stable scattered coronary artery calcifications. The pulmonary arterial tree is fairly well opacified. No filling defects to suggest pulmonary embolism. Mediastinum/Nodes: Persistent enlarged mediastinal and hilar lymph nodes. Infiltrating soft tissue in the left hilum is progressive and there is complete or near complete occlusion of the left lower lobe bronchus and also the lingular bronchus. Lungs/Pleura: Further narrowing of the right lower lobe bronchus with progressive dense airspace consolidation in the right lobe. Findings suspicious for progressive tumor and postobstructive pneumonia. There is a new left pleural effusion. Several ill-defined nodular airspace opacities in the lingula are likely nodular infiltrates. Stable underlying emphysema. No metastatic nodules in the right lung. Upper Abdomen: No significant upper abdominal findings. No obvious hepatic metastatic disease. Musculoskeletal: No significant bony findings. No chest wall mass, supraclavicular or axillary lymphadenopathy. Review of the MIP images confirms the above findings. IMPRESSION: 1. Progressive ill-defined soft tissue infiltrating the left hilum and mediastinum with now complete or near complete  occlusion of the left lower lobe bronchus and lingular bronchus. This is favored to represent tumor. Bronchoscopy is recommended. Extensive postobstructive pneumonia, worsened since the prior study. 2. Stable to slightly larger mediastinal and hilar lymph nodes. 3. No CT findings for pulmonary embolism. 4. Stable emphysematous changes and pulmonary scarring. Aortic Atherosclerosis (ICD10-I70.0) and Emphysema (ICD10-J43.9). Electronically Signed   By: Marijo Sanes M.D.   On: 01/05/2018 13:54        Scheduled Meds: . atorvastatin  20 mg Oral q1800  . budesonide (PULMICORT) nebulizer solution  0.5 mg Nebulization BID  . dextromethorphan-guaiFENesin  2 tablet Oral BID  . feeding supplement (ENSURE ENLIVE)  237 mL Oral BID BM  . heparin lock flush  250 Units Intracatheter Daily  . ipratropium  0.5 mg Nebulization Q6H  . levalbuterol  0.63 mg Nebulization Q6H  . oxyCODONE-acetaminophen  1 tablet Oral Once  . rivaroxaban  20 mg Oral q1800  . senna-docusate  1 tablet Oral QHS  . sucralfate  1 g Oral TID WC & HS   Continuous Infusions: . piperacillin-tazobactam (ZOSYN)  IV 3.375 g (01/07/18 0927)  . vancomycin Stopped (01/07/18 9201)     LOS: 2 days    Time spent: 35 mins    Irine Seal, MD [facility] Pager [phone]  If 7PM-7AM, please contact night-coverage [email or web address] Password [facility] 01/07/2018, 11:28 AM

## 2018-01-07 NOTE — Care Management Note (Signed)
Case Management Note  Patient Details  Name: ORRIN YURKOVICH MRN: [id number] Date of Birth: 1946/03/29  Subjective/Objective:                  Resp. Distress and infiltrates via cxr.   Action/Plan: Date: January 07, 2018 Velva Harman, BSN, [place], [place] Chart and notes review for patient progress and needs. Will follow for case management and discharge needs. No cm or discharge needs present at time of this review. Next review date: [id number]  Expected Discharge Date:                  Expected Discharge Plan:  Home/Self Care  In-House Referral:     Discharge planning Services  CM Consult  Post Acute Care Choice:    Choice offered to:     DME Arranged:    DME Agency:     HH Arranged:    HH Agency:     Status of Service:  In process, will continue to follow  If discussed at Long Length of Stay Meetings, dates discussed:    Additional Comments:  Leeroy Cha, RN 01/07/2018, 9:03 AM

## 2018-01-07 NOTE — Progress Notes (Addendum)
PMT no charge note  At present in no distress Awaiting transport to rad onc department to begin radiation therapy. No family at bedside BP 132/76 (BP Location: Left Arm)   Pulse (!) 113   Temp 98.4 F (36.9 C) (Oral)   Resp (!) 21   Ht 5\' 10"  (1.778 m)   Wt 82.2 kg (181 lb 3.5 oz)   SpO2 97%   BMI 26.00 kg/m  Labs and imaging noted Med history noted.   Will need to follow hospital course, disease trajectory and ongoing radiation schedules so as to engage in additional goals of care discussions with patient and family.   Loistine Chance MD [facility] [phone]  Addendum 01-07-18 1600 Tentative family meeting with wife, daughter and patient to continue goals of care discussions, further discussions on hospice mode of care to be undertaken on 01-09-18 between 0830 to 0900.

## 2018-01-07 NOTE — Evaluation (Signed)
Physical Therapy Evaluation Patient Details Name: Nicholas Hale MRN: [id number] DOB: Apr 13, 1946 Today's Date: 01/07/2018   History of Present Illness  72 yo male admitted with postobstructive pneumonia, sepsis, tumor progression. Hx of bil THR, cervical fusion, lung ca-on chemo/radiation, MSSA bacteremia    Clinical Impression  On eval, pt was Min guard for mobility. He walked ~70 feet with a RW. Pt c/o R pelvic/hip pain at rest and with activity. Pt presents with general weakness, decreased activity tolerance, and impaired gait and balance. O2 sat 93% on RA, HR 122 bpm, dyspnea 2/4 during activity. Discussed f/u HHPT-pt and wife are unsure if that will be the route they will take. Will follow and progress activity as tolerated.     Follow Up Recommendations Home health PT vs hospice (per chart)    Equipment Recommendations  None recommended by PT    Recommendations for Other Services       Precautions / Restrictions Precautions Precautions: Fall Restrictions Weight Bearing Restrictions: No      Mobility  Bed Mobility Overal bed mobility: Modified Independent Bed Mobility: Sidelying to Sit           General bed mobility comments: no assist given. increased time  Transfers Overall transfer level: Needs assistance Equipment used: Rolling walker (2 wheeled) Transfers: Sit to/from Stand Sit to Stand: Min guard         General transfer comment: close guard for safety. VCs hand placement  Ambulation/Gait Ambulation/Gait assistance: Min guard Ambulation Distance (Feet): 70 Feet Assistive device: Rolling walker (2 wheeled) Gait Pattern/deviations: Step-through pattern;Decreased stride length     General Gait Details: close guard for safety. O2 sat 93% on RA during ambulation. Pt fatigues fairly easily.   Stairs            Wheelchair Mobility    Modified Rankin (Stroke Patients Only)       Balance Overall balance assessment: Needs assistance          Standing balance support: Bilateral upper extremity supported Standing balance-Leahy Scale: Poor                               Pertinent Vitals/Pain Pain Assessment: Faces Faces Pain Scale: Hurts even more Pain Location: R pelvic/hip area Pain Descriptors / Indicators: Sore;Aching Pain Intervention(s): Monitored during session;Repositioned    Home Living Family/patient expects to be discharged to:: Private residence Living Arrangements: Spouse/significant other Available Help at Discharge: Family Type of Home: House Home Access: Stairs to enter Entrance Stairs-Rails: Right;Left   Home Layout: Able to live on main level with bedroom/bathroom Home Equipment: Environmental consultant - 2 wheels;Bedside commode      Prior Function Level of Independence: Independent with assistive device(s)         Comments: using RW since last admission     Hand Dominance        Extremity/Trunk Assessment   Upper Extremity Assessment Upper Extremity Assessment: Generalized weakness    Lower Extremity Assessment Lower Extremity Assessment: Generalized weakness    Cervical / Trunk Assessment Cervical / Trunk Assessment: Kyphotic  Communication   Communication: No difficulties  Cognition Arousal/Alertness: Awake/alert Behavior During Therapy: WFL for tasks assessed/performed Overall Cognitive Status: Within Functional Limits for tasks assessed  General Comments      Exercises     Assessment/Plan    PT Assessment Patient needs continued PT services  PT Problem List Decreased strength;Decreased balance;Decreased mobility;Decreased knowledge of use of DME       PT Treatment Interventions DME instruction;Therapeutic activities;Functional mobility training;Gait training;Balance training;Patient/family education;Therapeutic exercise    PT Goals (Current goals can be found in the Care Plan section)  Acute Rehab PT  Goals Patient Stated Goal: Regain IND PT Goal Formulation: With patient/family Time For Goal Achievement: 01/21/18 Potential to Achieve Goals: Fair    Frequency Min 3X/week   Barriers to discharge        Co-evaluation               AM-PAC PT "6 Clicks" Daily Activity  Outcome Measure Difficulty turning over in bed (including adjusting bedclothes, sheets and blankets)?: A Little Difficulty moving from lying on back to sitting on the side of the bed? : A Little Difficulty sitting down on and standing up from a chair with arms (e.g., wheelchair, bedside commode, etc,.)?: A Little Help needed moving to and from a bed to chair (including a wheelchair)?: A Little Help needed walking in hospital room?: A Little Help needed climbing 3-5 steps with a railing? : A Little 6 Click Score: 18    End of Session Equipment Utilized During Treatment: Gait belt Activity Tolerance: Patient limited by fatigue Patient left: in bed;with call bell/phone within reach;with family/visitor present   PT Visit Diagnosis: Muscle weakness (generalized) (M62.81);Difficulty in walking, not elsewhere classified (R26.2)    Time: 1023-1050 PT Time Calculation (min) (ACUTE ONLY): 27 min   Charges:   PT Evaluation $PT Eval Moderate Complexity: 1 Mod PT Treatments $Gait Training: 8-22 mins   PT G Codes:          Weston Anna, MPT Pager: [phone]

## 2018-01-07 NOTE — Progress Notes (Signed)
I stopped by following his consult with Dr. Sondra Come. We briefly discussed details of re-irradiation. He has simulated today and will proceed with radiation tomorrow. He does indicate he was using albuterol at home and occasional nebulizer treatment. I placed a respiratory therapy consult for evaluation and to determine if he needs nebulizer treatments now and to provide incentive spirometry.      Carola Rhine, PAC

## 2018-01-07 NOTE — Plan of Care (Signed)
Patient still having periods of shortness of breath and coughing.

## 2018-01-07 NOTE — Consult Note (Signed)
Consultation Note Date: 01/07/2018   Patient Name: Nicholas Hale  DOB: 1946-03-19  MRN: [id number]  Age / Sex: 72 y.o., male  PCP: Rankins, [name], MD Referring Physician: Eugenie Filler, MD  Reason for Consultation: Establishing goals of care  HPI/Patient Profile: 72 y.o. male admitted on 01/05/2018    Clinical Assessment and Goals of Care:  73 year old gentleman with a past medical history significant for metastatic non-small cell lung cancer, adenocarcinoma, patient is under the care of Dr. Rogue Jury, has undergone several chemotherapy and immunotherapy regimens as well as palliative radiation. Most recently, the patient was placed on systemic chemotherapy and is now hospitalized with ongoing weakness, postobstructive pneumonia, CT scan of the chest now showing progressive soft tissue mass in the left hilum and mediastinum with almost complete obstruction of left lower lobe bronchus and lingular bronchus. He has been seen and evaluated by Dr. Rogue Jury in this hospitalization. He has also been seen by radiation oncology. He is to undergo palliative radiation to the lung. Palliative consultation for goals of care discussions.  Patient is elderly gentleman resting in bed. Wife and daughter are present at the bedside. Daughter is a Science writer. She has contribution it greatly to the discussion we had at the time of the initial consultation. Goals wishes and values discussed after introduce myself and palliative care as follows: Palliative medicine is specialized medical care for people living with serious illness. It focuses on providing relief from the symptoms and stress of a serious illness. The goal is to improve quality of life for both the patient and the family.  At times, the patient did not participate much in our discussions. He simply closed his eyes and was resting. At that time, wife  and daughter were primary historians. Over all the patient has not had a very comfortable past 15 months. He has been experiencing progressive decline. He had severe mouth sores at one point. He has cough and discomfort in his chest while coughing more recently. At times he feels short of breath even while resting in bed. We reviewed his CT scan results. We discussed extensively about postobstructive pneumonia. We discussed about his symptom management regimen right now that consists of oxycodone immediate release, Percocet, Senokot, Hycodan cough syrup. Additionally, he is also to undergo breathing treatments and also to get incentive spirometry to be placed by his bed side.  Extensive goals of care discussions about what is important to the patient. He simply states he wants to be comfortable. He is willing to give palliative radiation attempt. He states that his mother died in a different state several years ago and was on a ventilator for an extended period of time. His father died of prostate cancer several decades ago. The patient wishes to avoid suffering. Family thinks that the patient simply has given up hope. If the patient tolerates palliative radiation and if his functional status improves, there is also an option for him being considered for resumption of systemic chemotherapy. That might provide him with life prolongation.  We discussed about goals wishes and values important to the patient. Discussed about quality of life and how best to go about achieving the most comfortable status for as long as he has left to live. We also discussed about CODE STATUS extensively. All of their questions answered to the best of my ability. Daughter wishes for the patient to go home with a DO NOT RESUSCITATE form at the time of discharge.  Please see additional recommendations below. Thank you for the consult.  HCPOA  wife and daughter have HCPOA paperwork that they have submitted to the hospital.  SUMMARY OF  RECOMMENDATIONS    1. Extensive code status discussions re visited, patient elects continuation of DNR DNI. 2. Patient to proceed with palliative radiation, first appointment on 01-07-18.  3. Detailed discussions on scope of hospice service, hospice philosophy of care, different levels of hospice care: home with hospice service versus residential hospice discussed in detail with patient and family.  All of their questions and concerns addressed to the best of my ability. PMT to continue to follow along, monitor disease trajectory and assist with symptom management and disposition planning.   Code Status/Advance Care Planning:  DNR    Symptom Management:     As above  Palliative Prophylaxis:  Bowel Regimen   Psycho-social/Spiritual:   Desire for further Chaplaincy support:yes  Additional Recommendations: Caregiving  Support/Resources  Prognosis:   Unable to determine  Discharge Planning: To Be Determined      Primary Diagnoses: Present on Admission: . Postobstructive pneumonia . Thrombocytopenia ([place]) . Adenocarcinoma of left lung, stage 4 ([place]) . Anemia . Sepsis due to pneumonia [facility])   I have reviewed the medical record, interviewed the patient and family, and examined the patient. The following aspects are pertinent.  Past Medical History:  Diagnosis Date  . Abdominal aortic aneurysm ([place])   . Arthritis    "hips; lower spine" (05/24/2014)  . Chemotherapy induced neutropenia ([place]) 03/31/2017  . Chronic fatigue 10/05/2016  . Encounter for antineoplastic chemotherapy 02/17/2017  . History of radiation therapy 10/05/17-10/25/17   left lung/chest 35 Gy in 14 fractions  . Hyperlipidemia   . Hypertension   . Lung cancer ([place]) 09/25/2016  . PE (pulmonary embolism) 9/15  . Pericardial effusion 09/20/2016   Malignant effusion s/p pericardial drain  . Personal history of DVT (deep vein thrombosis) 9/15   Social History   Socioeconomic History  . Marital status: Married      Spouse name: None  . Number of children: None  . Years of education: None  . Highest education level: None  Social Needs  . Financial resource strain: None  . Food insecurity - worry: None  . Food insecurity - inability: None  . Transportation needs - medical: None  . Transportation needs - non-medical: None  Occupational History  . None  Tobacco Use  . Smoking status: Former Smoker    Packs/day: 0.50    Years: 40.00    Pack years: 20.00    Types: Cigarettes    Last attempt to quit: 2010    Years since quitting: 9.1  . Smokeless tobacco: Never Used  Substance and Sexual Activity  . Alcohol use: No  . Drug use: No  . Sexual activity: No  Other Topics Concern  . None  Social History Narrative  . None   Family History  Problem Relation Age of Onset  . Clotting disorder Mother   . Diabetes Mellitus I Mother   . Prostate cancer Father  Scheduled Meds: . atorvastatin  20 mg Oral q1800  . dextromethorphan-guaiFENesin  2 tablet Oral BID  . feeding supplement (ENSURE ENLIVE)  237 mL Oral BID BM  . heparin lock flush  250 Units Intracatheter Daily  . oxyCODONE-acetaminophen  1 tablet Oral Once  . potassium chloride  40 mEq Oral Once  . rivaroxaban  20 mg Oral q1800  . senna-docusate  1 tablet Oral QHS  . sucralfate  1 g Oral TID WC & HS   Continuous Infusions: . piperacillin-tazobactam (ZOSYN)  IV Stopped (01/07/18 1610)  . vancomycin Stopped (01/07/18 9604)   PRN Meds:.acetaminophen **OR** acetaminophen, albuterol, heparin lock flush **AND** heparin lock flush, HYDROcodone-homatropine, oxyCODONE, polyethylene glycol, prochlorperazine, sodium chloride Medications Prior to Admission:  Prior to Admission medications   Medication Sig Start Date End Date Taking? Authorizing Provider  albuterol (PROVENTIL HFA;VENTOLIN HFA) 108 (90 Base) MCG/ACT inhaler Inhale 2 puffs into the lungs every 4 (four) hours as needed for wheezing or shortness of breath. 07/27/17  Yes Byrum,  Rose Fillers, MD  atorvastatin (LIPITOR) 20 MG tablet TAKE 1 TABLET BY MOUTH EVERY DAY AT 6PM 10/12/17  Yes End, Harrell Gave, MD  dexamethasone (DECADRON) 4 MG tablet 4 mg by mouth twice a day the day before, day of and day after the chemotherapy every 3 weeks. 02/17/17  Yes [name], MD  Dextromethorphan-Guaifenesin [facility] DM PO) Take 20 mLs by mouth 2 (two) times daily as needed (chest congestion).    Yes [provider]  lidocaine-prilocaine (EMLA) cream Apply 1 application topically daily as needed (on port site).    Yes [provider]  magic mouthwash SOLN Take 5 mLs by mouth 4 (four) times daily. Prednisone 80 mg , Nystatin 30 ml, Benadryl 12.5 mg/5 ml. Mix to 240 ml, 1:1:1 concentration. 11/24/17  Yes [name], MD  Melatonin 3 MG TABS Take 1 tablet by mouth at bedtime as needed (sleep).   Yes [provider]  oxyCODONE (ROXICODONE) 5 MG/5ML solution Take 5 mLs (5 mg total) by mouth every 4 (four) hours as needed for severe pain. 12/02/17  Yes Tanner, Lyndon Code., PA-C  polyethylene glycol (MIRALAX / GLYCOLAX) packet Take 17 g by mouth daily. Patient taking differently: Take 17 g by mouth daily as needed for moderate constipation.  12/25/17  Yes Florencia Reasons, MD  prochlorperazine (COMPAZINE) 10 MG tablet Take 1 tablet (10 mg total) by mouth every 6 (six) hours as needed for nausea or vomiting. 11/24/17  Yes [name], MD  senna-docusate (SENOKOT-S) 8.6-50 MG tablet Take 1 tablet by mouth at bedtime. 12/24/17  Yes Florencia Reasons, MD  sodium chloride (OCEAN) 0.65 % SOLN nasal spray Place 2 sprays into both nostrils as needed for congestion.   Yes [provider]  sucralfate (CARAFATE) 1 g tablet Take 1 tablet (1 g total) by mouth 4 (four) times daily -  with meals and at bedtime. Crush and dissolve in 10 cc of water prior to swallowing Patient taking differently: Take 1 g by mouth 4 (four) times daily -  before meals and at bedtime. Crush and dissolve in 10 cc  of water prior to swallowing 10/25/17  Yes Gery Pray, MD  XARELTO 20 MG TABS tablet TAKE 1 TABLET BY MOUTH EVERY DAY 11/17/17  Yes Collene Gobble, MD  diphenhydrAMINE (BENADRYL) 25 MG tablet Take 1 tablet (25 mg total) by mouth at bedtime as needed for sleep. Patient not taking: Reported on 01/05/2018 12/24/17   Florencia Reasons, MD  lidocaine (XYLOCAINE)  2 % solution Use as directed 5 mLs in the mouth or throat every 3 (three) hours as needed for mouth pain. Patient not taking: Reported on 01/05/2018 12/02/17   Harle [place]., PA-C   Allergies  Allergen Reactions  . Lidocaine Swelling    "Hard white blisters" formed on tongue, swollen mouth   Review of Systems +weakness +cough +episodic shortness of breath  Physical Exam Awake alert Resting in bed Diminished breath sounds, especially towards bases S1 S2 Abdomen soft No edema Non focal Does appear to have flat affect at times, makes great effort to engage with me but for the most part, avoids eye contact.  Vital Signs: BP 132/76 (BP Location: Left Arm)   Pulse (!) 113   Temp 98.4 F (36.9 C) (Oral)   Resp (!) 21   Ht 5\' 10"  (1.778 m)   Wt 82.2 kg (181 lb 3.5 oz)   SpO2 97%   BMI 26.00 kg/m  Pain Assessment: No/denies pain   Pain Score: 0-No pain  PPS 30-40% SpO2: SpO2: 97 % O2 Device:SpO2: 97 % O2 Flow Rate: .O2 Flow Rate (L/min): 2 L/min  IO: Intake/output summary:   Intake/Output Summary (Last 24 hours) at 01/07/2018 0858 Last data filed at 01/07/2018 0542 Gross per 24 hour  Intake 1590 ml  Output -  Net 1590 ml    LBM: Last BM Date: 01/06/18 Baseline Weight: Weight: 82.2 kg (181 lb 3.5 oz) Most recent weight: Weight: 82.2 kg (181 lb 3.5 oz)     Palliative Assessment/Data:   Flowsheet Rows     Most Recent Value  Intake Tab  Referral Department  Hospitalist  Unit at Time of Referral  Oncology Unit  Palliative Care Primary Diagnosis  Cancer  Palliative Care Type  New Palliative care  Reason for referral  Clarify  Goals of Care  Date first seen by Palliative Care  01/06/18  Clinical Assessment  Palliative Performance Scale Score  40%  Pain Max last 24 hours  4  Pain Min Last 24 hours  3  Dyspnea Max Last 24 Hours  6  Dyspnea Min Last 24 hours  4  Nausea Max Last 24 Hours  2  Nausea Min Last 24 Hours  1  Anxiety Max Last 24 Hours  5  Anxiety Min Last 24 Hours  4  Psychosocial & Spiritual Assessment  Palliative Care Outcomes  Patient/Family meeting held?  Yes  Who was at the meeting?  patient, wife, daughter.   Palliative Care Outcomes  Clarified goals of care      Time In:  1600 on 01-06-18 Time Out:  1710 on 01-06-18  Time Total:  70 min  Greater than 50%  of this time was spent counseling and coordinating care related to the above assessment and plan.  Signed by: Loistine Chance, MD  [phone]  Please contact Palliative Medicine Team phone at [phone] for questions and concerns.  For individual provider: See Shea Evans

## 2018-01-07 NOTE — Progress Notes (Signed)
PHARMACY NOTE -  [facility] has been assisting with dosing of Zosyn for PNA. Dosage remains stable at 3.375gm IV q8h over 4h and need for further dosage adjustment appears unlikely at present.  Renal function stable, Vancomycin d/c'd.   Will sign off at this time.  Please reconsult if a change in clinical status warrants re-evaluation of dosage.  Netta Cedars, PharmD, BCPS Pager: [phone] 01/07/2018@11 :48 AM

## 2018-01-08 LAB — CBC
HCT: 29.6 % — ABNORMAL LOW (ref 39.0–52.0)
Hemoglobin: 9.3 g/dL — ABNORMAL LOW (ref 13.0–17.0)
MCH: 32 pg (ref 26.0–34.0)
MCHC: 31.4 g/dL (ref 30.0–36.0)
MCV: 101.7 fL — ABNORMAL HIGH (ref 78.0–100.0)
PLATELETS: 124 10*3/uL — AB (ref 150–400)
RBC: 2.91 MIL/uL — AB (ref 4.22–5.81)
RDW: 18.4 % — ABNORMAL HIGH (ref 11.5–15.5)
WBC: 13.6 10*3/uL — AB (ref 4.0–10.5)

## 2018-01-08 LAB — BASIC METABOLIC PANEL
Anion gap: 5 (ref 5–15)
BUN: 13 mg/dL (ref 6–20)
CO2: 26 mmol/L (ref 22–32)
Calcium: 8.1 mg/dL — ABNORMAL LOW (ref 8.9–10.3)
Chloride: 107 mmol/L (ref 101–111)
Creatinine, Ser: 0.85 mg/dL (ref 0.61–1.24)
Glucose, Bld: 124 mg/dL — ABNORMAL HIGH (ref 65–99)
POTASSIUM: 3.6 mmol/L (ref 3.5–5.1)
SODIUM: 138 mmol/L (ref 135–145)

## 2018-01-08 MED ORDER — LEVALBUTEROL HCL 0.63 MG/3ML IN NEBU
0.6300 mg | INHALATION_SOLUTION | Freq: Four times a day (QID) | RESPIRATORY_TRACT | Status: DC
  Administered 2018-01-09 (×3): 0.63 mg via RESPIRATORY_TRACT
  Filled 2018-01-08 (×3): qty 3

## 2018-01-08 MED ORDER — IPRATROPIUM BROMIDE 0.02 % IN SOLN
0.5000 mg | Freq: Four times a day (QID) | RESPIRATORY_TRACT | Status: DC
  Administered 2018-01-09 (×3): 0.5 mg via RESPIRATORY_TRACT
  Filled 2018-01-08 (×3): qty 2.5

## 2018-01-08 MED ORDER — RIVAROXABAN 20 MG PO TABS
20.0000 mg | ORAL_TABLET | Freq: Every day | ORAL | Status: DC
  Administered 2018-01-08 – 2018-01-12 (×5): 20 mg via ORAL
  Filled 2018-01-08 (×5): qty 1

## 2018-01-08 NOTE — Progress Notes (Signed)
Pt was awake and lying in bed when I arrived. His wife was bedside. Pt said he was is just tired. He and his wife talked about his recent hospitalizations (in Jan and now) and his journey w/cxr. He spoke of his belief in God and said he has not been a good person. He said he has not been to church but he prays every night and he always enjoys that. We talked about the relationship he has had w/God in his heart. Pt's wife said he is a good person. Pt's wife said their daughter Hewitt Blade) is a Marine scientist for the [employer] main campus). Pt spoke of how he is ready to end this and if he could have he would have a couple nights ago. His expression was based on his spiritual thoughts and his physical illness. Pt requested prayer and his wife joined [place]. Pt and wife were appropriately tearful and grateful for visit and prayer. Please page if additional assistance is needed. [place], [place]   01/08/18 1700  Clinical Encounter Type  Visited With Patient and family together

## 2018-01-08 NOTE — Progress Notes (Signed)
PT demonstrated hands on understanding of Flutter device. PC at this time. 

## 2018-01-08 NOTE — Progress Notes (Signed)
PT in pain from coughing at this time. RN and MD aware and addressing.Flutter will be held (not to encourage coughing due to pain at this time).

## 2018-01-08 NOTE — Plan of Care (Signed)
  Pain Managment: General experience of comfort will improve 01/08/2018 2048 - Progressing by Mickie Kay, RN

## 2018-01-08 NOTE — Progress Notes (Signed)
PROGRESS NOTE    Nicholas Hale  JXB:[id number] DOB: [birth year] DOA: 01/05/2018 PCP: [name], MD    Brief Narrative:   Nicholas Hale is a 72 y.o. male with a history of DVT/PE on xarelto and stage IV NSCLC with recent admission for pneumonia and MSSA bacteremia discharged 1/25 on ancef thru PICC through 2/2. After initial improvement he noted constant, progressive, now severe dyspnea associated with increasing weakness and nonproductive cough with pleuritic chest pain. No fevers. He fell due to weakness getting into bed this morning prompting return to ED where he was ill-appearing, dyspneic and placed on oxygen. CXR suggested progressive LLL opacity, confirmed by CTA chest to be progressive airspace disease beyond narrowed LLL and lingular bronchi narrowed by hilar mass. Blood cultures drawn, vancomycin and zosyn were started, and hospitalists called to admit for postobstructive pneumonia. The patient's oncologist, Dr. [name] was called for consult.     Assessment & Plan:   Principal Problem:   Postobstructive pneumonia Active Problems:   Adenocarcinoma of left lung, stage 4 (HCC)   Thrombocytopenia (HCC)   Anemia   Sepsis due to pneumonia ([name])   Hypokalemia   Elevated TSH   Encounter for palliative care  1 sepsis secondary to postobstructive left lower lobe pneumonia Patient on admission met criteria for sepsis secondary to tachycardia, tachypnea, leukocytosis, CT scan concerning for postobstructive pneumonia with progression of lung disease in the left hilar region resulting in near complete left lower lung obstruction/collapse.  Patient currently afebrile.  MRSA PCR negative.  Blood cultures pending.  Sputum Gram stain and cultures pending.  This patient on scheduled Pulmicort, scheduled Xopenex and Atrovent nebs as well as as needed nebs.  Continue Mucinex.  Continue empiric IV Zosyn.  Discontinued IV vancomycin.  Patient has been seen in consultation by pulmonary, Dr.  [name] who feels no role for bronchoscopy at this time and recommending continued IV antibiotics, palliative care consultation, consultation with oncology and rad/onc for possible palliative radiation.  Patient has been seen by radiation oncology who recommended some palliative radiation started on 01/07/2018.  Patient has also been seen by his oncologist Dr. [name] who is in agreement with retreatment with radiation if possible and if there is improvement in patient's condition may consider resuming systemic chemotherapy.  Appreciate pulmonary's input and recommendations.  Oncology and rad/oncology following.  2.  Stage IV non-small cell lung cancer: Status post XRT completed November 2018 and last chemotherapy December 2018. Patient with history of malignant pericardial effusion.  No current effusion noted at this time.  Patient with progression of disease leading to postobstructive pneumonia.  Patient has been seen by radiation oncology who are planning palliative radiation starting today.  Patient has also been seen by his oncologist, Dr. [name] who is in agreement with retreatment with radiation if possible and if there is improvement in patient's condition may consider resuming his systemic chemotherapy.  Palliative care has assessed the patient, family at this time wanting to proceed with palliative radiation first prior to deciding as to whether to shift focus to full comfort.  Patient receiving palliative radiation. Palliative care to meet with family tomorrow 01/09/2018.  Appreciate palliative care input.   3.  History of DVT/PE CT angiogram chest negative for any acute PE.  Patient with no overt bleeding.  Continue anticoagulation with Xarelto.  Follow.  4.  Thrombocytopenia Stable.  No bleeding noted.  Monitor.  5.  Anemia of chronic disease Hemoglobin currently stable at 9.3.  Transfusion  threshold hemoglobin less than 7.    6.  Elevated TSH During recent admission TSH was  17 with a normal free T4.  Patient will need repeat thyroid function studies done in about 4-6 weeks in the outpatient setting with PCP.  7.  Hypocalcemia Patient given a dose of calcium gluconate on admission.  Follow.  8.  Hypokalemia/hypomagnesemia Repleted.  Follow.    DVT prophylaxis: xarelto Code Status: DNR Family Communication: updated patient and son and wife at bedside. Disposition Plan: To be determined.  Likely back home.   Consultants:   Pulmonary: Dr. [name] 01/06/2018  Oncology: Dr. [name] 01/06/2018  Radiation oncology  Palliative care: Dr.Anwar 01/07/2018  Procedures:   CT angiogram chest 01/05/2018  Chest x-ray 01/05/2018    Antimicrobials:   IV Zosyn 01/05/2018  IV vancomycin 01/05/2018>>>>> 01/07/2018   Subjective: Patient sitting up in bed.  States no significant changes with her shortness of breath.  Patient getting some radiation treatment.  Respiratory therapy patient complaining of some sore throat discomfort which occurred the last time he had radiation treatment and improved on sucralfate.     Objective: Vitals:   01/07/18 2128 01/08/18 0229 01/08/18 0529 01/08/18 1029  BP: 138/61  (!) 146/78   Pulse: (!) 119  (!) 103   Resp: 20  20   Temp: 98.5 F (36.9 C)  97.9 F (36.6 C)   TempSrc: Oral  Oral   SpO2: 97% 95% 95% 96%  Weight:      Height:        Intake/Output Summary (Last 24 hours) at 01/08/2018 1242 Last data filed at 01/08/2018 0600 Gross per 24 hour  Intake 150 ml  Output -  Net 150 ml   Filed Weights   01/05/18 1713  Weight: 82.2 kg (181 lb 3.5 oz)    Examination:  General exam: Sitting up in bed.  Appears calm and comfortable  Respiratory system: Fair air movement.  No wheezing.  Some scattered rhonchorous/coarse breath sounds.  No use of accessory muscles of respiration. Cardiovascular system: Regular rate and rhythm no murmurs rubs or gallops. No lower extremity edema.  No JVD.  Gastrointestinal system: Abdomen is  soft, nontender, nondistended, positive bowel sounds.  No hepatosplenomegaly.  Central nervous system: Alert and oriented. No focal neurological deficits. Extremities: Symmetric 5 x 5 power. Skin: No rashes, lesions or ulcers Psychiatry: Judgement and insight appear normal. Mood & affect appropriate.     Data Reviewed: I have personally reviewed following labs and imaging studies  CBC: Recent Labs  Lab 01/05/18 1026 01/05/18 1046 01/06/18 0904 01/07/18 0634 01/08/18 0632  WBC 20.7*  --  15.1* 15.3* 13.6*  NEUTROABS 17.9*  --  13.5* 13.3*  --   HGB 10.8* 10.5* 9.3* 9.0* 9.3*  HCT 34.1* 31.0* 30.4* 28.9* 29.6*  MCV 102.4*  --  104.1* 102.5* 101.7*  PLT 126*  --  121* 123* 938*   Basic Metabolic Panel: Recent Labs  Lab 01/05/18 1026 01/05/18 1046 01/06/18 0904 01/07/18 0634 01/08/18 0632  NA 139 139 136 135 138  K 3.3* 3.4* 3.3* 3.4* 3.6  CL 103 102 106 104 107  CO2 25  --  '22 24 26  '$ GLUCOSE 163* 164* 208* 163* 124*  BUN '16 15 14 15 13  '$ CREATININE 1.07 0.90 0.88 0.90 0.85  CALCIUM 8.3*  --  8.0* 8.0* 8.1*  MG  --   --  1.5* 1.9  --    GFR: Estimated Creatinine Clearance: 82.3 mL/min (by C-G  formula based on SCr of 0.85 mg/dL). Liver Function Tests: Recent Labs  Lab 01/05/18 1026  AST 63*  ALT 19  ALKPHOS 89  BILITOT 0.5  PROT 6.7  ALBUMIN 2.1*   No results for input(s): LIPASE, AMYLASE in the last 168 hours. No results for input(s): AMMONIA in the last 168 hours. Coagulation Profile: No results for input(s): INR, PROTIME in the last 168 hours. Cardiac Enzymes: No results for input(s): CKTOTAL, CKMB, CKMBINDEX, TROPONINI in the last 168 hours. BNP (last 3 results) No results for input(s): PROBNP in the last 8760 hours. HbA1C: No results for input(s): HGBA1C in the last 72 hours. CBG: No results for input(s): GLUCAP in the last 168 hours. Lipid Profile: No results for input(s): CHOL, HDL, LDLCALC, TRIG, CHOLHDL, LDLDIRECT in the last 72 hours. Thyroid  Function Tests: No results for input(s): TSH, T4TOTAL, FREET4, T3FREE, THYROIDAB in the last 72 hours. Anemia Panel: No results for input(s): VITAMINB12, FOLATE, FERRITIN, TIBC, IRON, RETICCTPCT in the last 72 hours. Sepsis Labs: Recent Labs  Lab 01/05/18 1046 01/05/18 1525  LATICACIDVEN 1.09 0.99    Recent Results (from the past 240 hour(s))  Blood Culture (routine x 2)     Status: None (Preliminary result)   Collection Time: 01/05/18 10:26 AM  Result Value Ref Range Status   Specimen Description   Final    BLOOD LEFT ANTECUBITAL Performed at [facility] [address] [place], [place] [address]    Special Requests   Final    BOTTLES DRAWN AEROBIC AND ANAEROBIC Blood Culture adequate volume Performed at [facility] [address], [place], [place] [address]    Culture   Final    NO GROWTH 3 DAYS Performed at [facility], [place] [address], [place], [place] [address]    Report Status PENDING  Incomplete  Blood Culture (routine x 2)     Status: None (Preliminary result)   Collection Time: 01/05/18 10:30 AM  Result Value Ref Range Status   Specimen Description   Final    BLOOD LEFT WRIST Performed at [facility] [address], [place], [place] [address]    Special Requests   Final    BOTTLES DRAWN AEROBIC AND ANAEROBIC Blood Culture adequate volume Performed at [facility] [address], [place], [place] [address]    Culture   Final    NO GROWTH 3 DAYS Performed at [facility], [place] [address] [place], [place] [address]    Report Status PENDING  Incomplete  MRSA PCR Screening     Status: None   Collection Time: 01/06/18 12:08 PM  Result Value Ref Range Status   MRSA by PCR NEGATIVE NEGATIVE Final    Comment:        The GeneXpert MRSA Assay (FDA approved for NASAL specimens only), is one component of a comprehensive MRSA colonization surveillance program. It is  not intended to diagnose MRSA infection nor to guide or monitor treatment for MRSA infections. Performed at [facility], [place] [address], [place], [place] [address]          Radiology Studies: No results found.      Scheduled Meds: . atorvastatin  20 mg Oral q1800  . budesonide (PULMICORT) nebulizer solution  0.5 mg Nebulization BID  . dextromethorphan-guaiFENesin  2 tablet Oral BID  . feeding supplement (ENSURE ENLIVE)  237 mL Oral BID BM  . heparin lock flush  250 Units Intracatheter  Daily  . ipratropium  0.5 mg Nebulization Q6H  . levalbuterol  0.63 mg Nebulization Q6H  . LORazepam  1 mg Oral QHS  . oxyCODONE-acetaminophen  1 tablet Oral Once  . rivaroxaban  20 mg Oral Q supper  . senna-docusate  1 tablet Oral QHS  . sucralfate  1 g Oral TID WC & HS   Continuous Infusions: . piperacillin-tazobactam (ZOSYN)  IV 3.375 g (01/08/18 1118)     LOS: 3 days    Time spent: 84 mins    Irine Seal, MD [facility] Pager [phone]  If 7PM-7AM, please contact night-coverage [email or web address] Password [email or web address] 01/08/2018, 12:42 PM

## 2018-01-09 LAB — BASIC METABOLIC PANEL
Anion gap: 8 (ref 5–15)
BUN: 14 mg/dL (ref 6–20)
CHLORIDE: 104 mmol/L (ref 101–111)
CO2: 24 mmol/L (ref 22–32)
CREATININE: 0.88 mg/dL (ref 0.61–1.24)
Calcium: 8.1 mg/dL — ABNORMAL LOW (ref 8.9–10.3)
GFR calc Af Amer: >60 mL/min (ref >60)
GFR calc non Af Amer: >60 mL/min (ref >60)
Glucose, Bld: 116 mg/dL — ABNORMAL HIGH (ref 65–99)
POTASSIUM: 3.5 mmol/L (ref 3.5–5.1)
SODIUM: 136 mmol/L (ref 135–145)

## 2018-01-09 LAB — CBC
HEMATOCRIT: 28.7 % — AB (ref 39.0–52.0)
HEMOGLOBIN: 9.3 g/dL — AB (ref 13.0–17.0)
MCH: 32.5 pg (ref 26.0–34.0)
MCHC: 32.4 g/dL (ref 30.0–36.0)
MCV: 100.3 fL — ABNORMAL HIGH (ref 78.0–100.0)
Platelets: 131 10*3/uL — ABNORMAL LOW (ref 150–400)
RBC: 2.86 MIL/uL — AB (ref 4.22–5.81)
RDW: 18.4 % — ABNORMAL HIGH (ref 11.5–15.5)
WBC: 15.1 10*3/uL — ABNORMAL HIGH (ref 4.0–10.5)

## 2018-01-09 LAB — MAGNESIUM: Magnesium: 1.6 mg/dL — ABNORMAL LOW (ref 1.7–2.4)

## 2018-01-09 MED ORDER — POTASSIUM CHLORIDE CRYS ER 20 MEQ PO TBCR
40.0000 meq | EXTENDED_RELEASE_TABLET | Freq: Once | ORAL | Status: AC
  Administered 2018-01-09: 40 meq via ORAL
  Filled 2018-01-09: qty 2

## 2018-01-09 MED ORDER — MIRTAZAPINE 15 MG PO TBDP
7.5000 mg | ORAL_TABLET | Freq: Every day | ORAL | Status: DC
  Administered 2018-01-09 – 2018-01-10 (×2): 7.5 mg via ORAL
  Filled 2018-01-09 (×2): qty 0.5

## 2018-01-09 MED ORDER — PHENOL 1.4 % MT LIQD
1.0000 | OROMUCOSAL | Status: DC | PRN
  Administered 2018-01-09: 1 via OROMUCOSAL
  Filled 2018-01-09: qty 177

## 2018-01-09 MED ORDER — IPRATROPIUM BROMIDE 0.02 % IN SOLN
0.5000 mg | Freq: Three times a day (TID) | RESPIRATORY_TRACT | Status: DC
  Administered 2018-01-10 – 2018-01-12 (×8): 0.5 mg via RESPIRATORY_TRACT
  Filled 2018-01-09 (×9): qty 2.5

## 2018-01-09 MED ORDER — GUAIFENESIN-DM 100-10 MG/5ML PO SYRP: 5.0000 mL | ORAL_SOLUTION | ORAL | Status: DC | PRN

## 2018-01-09 MED ORDER — MAGNESIUM SULFATE 4 GM/100ML IV SOLN
4.0000 g | Freq: Once | INTRAVENOUS | Status: AC
  Administered 2018-01-09: 4 g via INTRAVENOUS
  Filled 2018-01-09: qty 100

## 2018-01-09 MED ORDER — KETOROLAC TROMETHAMINE 30 MG/ML IJ SOLN
30.0000 mg | Freq: Four times a day (QID) | INTRAMUSCULAR | Status: DC | PRN
  Administered 2018-01-09 – 2018-01-12 (×5): 30 mg via INTRAVENOUS
  Filled 2018-01-09 (×5): qty 1

## 2018-01-09 MED ORDER — LEVALBUTEROL HCL 0.63 MG/3ML IN NEBU
0.6300 mg | INHALATION_SOLUTION | Freq: Three times a day (TID) | RESPIRATORY_TRACT | Status: DC
  Administered 2018-01-10 – 2018-01-12 (×8): 0.63 mg via RESPIRATORY_TRACT
  Filled 2018-01-09 (×9): qty 3

## 2018-01-09 NOTE — Progress Notes (Signed)
Daily Progress Note   Patient Name: Nicholas Hale       Date: 01/09/2018 DOB: May 20, 1946  Age: 72 y.o. MRN#: [id number] Attending Physician: Eugenie Filler, MD Primary Care Physician: [name], MD Admit Date: 01/05/2018  Reason for Consultation/Follow-up: Establishing goals of care, Non pain symptom management, Pain control and Psychosocial/spiritual support  Subjective:  patient is a little groggy this morning, wife and daughter are at the bedside, he did not rest well this morning, felt dizzy earlier today, still has ongoing weakness, decreased appetite, lots of symptom burden from cough and pain in chest wall as well as generalized pain.   A family meeting ensued, for symptom management as well as overall goals of care discussions. See below:   Length of Stay: 4  Current Medications: Scheduled Meds:  . atorvastatin  20 mg Oral q1800  . budesonide (PULMICORT) nebulizer solution  0.5 mg Nebulization BID  . dextromethorphan-guaiFENesin  2 tablet Oral BID  . feeding supplement (ENSURE ENLIVE)  237 mL Oral BID BM  . heparin lock flush  250 Units Intracatheter Daily  . ipratropium  0.5 mg Nebulization Q6H WA  . levalbuterol  0.63 mg Nebulization Q6H WA  . mirtazapine  7.5 mg Oral QHS  . oxyCODONE-acetaminophen  1 tablet Oral Once  . potassium chloride  40 mEq Oral Once  . rivaroxaban  20 mg Oral Q supper  . senna-docusate  1 tablet Oral QHS  . sucralfate  1 g Oral TID WC & HS    Continuous Infusions: . magnesium sulfate 1 - 4 g bolus IVPB    . piperacillin-tazobactam (ZOSYN)  IV Stopped (01/09/18 0648)    PRN Meds: acetaminophen **OR** acetaminophen, [DISCONTINUED] ipratropium **AND** albuterol, guaiFENesin-dextromethorphan, heparin lock flush **AND** heparin lock  flush, HYDROcodone-homatropine, oxyCODONE, polyethylene glycol, prochlorperazine, sodium chloride  Physical Exam         Asleep but awakens at times, is able to interact some what Resting in bed No edema Abdomen does not appear distended Regular pattern of breathing No labored breathing noted Still has cough spells   Vital Signs: BP (!) 129/7 (BP Location: Left Arm)   Pulse (!) 110   Temp 98.5 F (36.9 C) (Oral)   Resp 20   Ht 5\' 10"  (1.778 m)   Wt 82.2 kg (181 lb 3.5  oz)   SpO2 94%   BMI 26.00 kg/m  SpO2: SpO2: 94 % O2 Device: O2 Device: Nasal Cannula O2 Flow Rate: O2 Flow Rate (L/min): 2 L/min  Intake/output summary:   Intake/Output Summary (Last 24 hours) at 01/09/2018 9485 Last data filed at 01/08/2018 2000 Gross per 24 hour  Intake 540 ml  Output -  Net 540 ml   LBM: Last BM Date: 01/08/18 Baseline Weight: Weight: 82.2 kg (181 lb 3.5 oz) Most recent weight: Weight: 82.2 kg (181 lb 3.5 oz)       Palliative Assessment/Data: PPS 30%   Flowsheet Rows     Most Recent Value  Intake Tab  Referral Department  Hospitalist  Unit at Time of Referral  Oncology Unit  Palliative Care Primary Diagnosis  Cancer  Palliative Care Type  New Palliative care  Reason for referral  Clarify Goals of Care  Date first seen by Palliative Care  01/06/18  Clinical Assessment  Palliative Performance Scale Score  40%  Pain Max last 24 hours  4  Pain Min Last 24 hours  3  Dyspnea Max Last 24 Hours  6  Dyspnea Min Last 24 hours  4  Nausea Max Last 24 Hours  2  Nausea Min Last 24 Hours  1  Anxiety Max Last 24 Hours  5  Anxiety Min Last 24 Hours  4  Psychosocial & Spiritual Assessment  Palliative Care Outcomes  Patient/Family meeting held?  Yes  Who was at the meeting?  patient, wife, daughter.   Palliative Care Outcomes  Clarified goals of care      Patient Active Problem List   Diagnosis Date Noted  . Encounter for palliative care   . Hypokalemia   . Elevated TSH   .  Postobstructive pneumonia 01/05/2018  . Sepsis due to pneumonia ([place]) 01/05/2018  . 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 Staphylococcus aureus bacteremia   . Bacteremia   . Malnutrition of moderate degree 12/17/2017  . Pneumonia 12/14/2017  . Leukocytosis 12/14/2017  . Anemia 12/14/2017  . Hyponatremia 12/14/2017  . HCAP (healthcare-associated pneumonia)   . Influenza vaccination administered at current visit 09/02/2017  . Gingival bleeding 08/21/2017  . Thrombocytopenia ([place]) 08/21/2017  . Dyspnea on exertion 04/05/2017  . Chemotherapy induced neutropenia ([place]) 03/31/2017  . Port catheter in place 03/03/2017  . Encounter for antineoplastic chemotherapy 02/17/2017  . Adenocarcinoma of left lung, stage 4 ([place]) 10/05/2016  . Goals of care, counseling/discussion 10/05/2016  . Chronic fatigue 10/05/2016  . Lung cancer ([place]) 09/25/2016  . Acute deep vein thrombosis (DVT) of proximal vein of both lower extremities (HCC)   . Cardiac tamponade   . Elevated troponin 09/20/2016  . 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 Ventricular tachycardia ([place]) 09/19/2016  . Renal cyst, right 09/19/2016  . Liver lesion 09/19/2016  . Nonsustained ventricular tachycardia ([place])   . History of tobacco abuse 01/28/2016  . Primary osteoarthritis of right hip 05/10/2015  . 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 Arthritis of left hip 05/23/2014  . Hypertension 05/15/2014  . Hyperlipidemia 05/15/2014    Palliative Care Assessment & Plan   Patient Profile:    Assessment:  sepsis secondary to post obstructive LLL PNA Stage IV NSCLC History of malignant pericardial effusion History of DVT/PE Functional as well as cognitive decline for past 1 year or so Chronic sleep disorder, chronic history of sleep less ness, made worse by advancing malignancy, hospitalization, possibly benzo use etc. Worsening generalized weakness, deconditioning.  Sore throat Difficulty swallowing larger  pills  Recommendations/Plan:   Chloraseptic throat spray to bedside, use PRN  D/C Ativan  Start Remeron Q HS to help with mood, appetite and sleep.   Continue radiation treatments.  May need out patient palliative care at home or at cancer center.   Continue supportive care  Antibiotics and further hospital care as per my colleague Dr Grandville Silos.   Discussed about the patient's loss of meaning and loss of dignity as a result of his malignancy and above mentioned decline. Offered active listening and supportive care.   Goals are not comfort-only at this point. Patient and family wish to continue current mode of care, remain hopeful for some degree of stabililization/recovery. After discharge, and after palliative radiation therapy is completed, should the patient have an ongoing increased symptom burden leading to an unacceptable further lowering of his quality of life, then, at that point, patient and family would like to instate hospice services at home. Continue with palliative care for now.     Code Status:    Code Status Orders  (From admission, onward)        Start     Ordered   01/05/18 2008  Do not attempt resuscitation (DNR)  Continuous    Question Answer Comment  In the event of cardiac or respiratory ARREST Do not call a "code blue"   In the event of cardiac or respiratory ARREST Do not perform Intubation, CPR, defibrillation or ACLS   In the event of cardiac or respiratory ARREST Use medication by any route, position, wound care, and other measures to relive pain and suffering. May use oxygen, suction and manual treatment of airway obstruction as needed for comfort.   Comments Confirmed with patient and family 2/6      01/05/18 2008    Code Status History    Date Active Date Inactive Code Status Order ID Comments User Context   01/05/2018 17:24 01/05/2018 19:59 DNR [id number]  Patrecia Pour, MD Inpatient   12/15/2017 08:19 12/24/2017 17:37 Full Code [id number]  Jani Gravel, MD ED   08/21/2017 13:39 08/22/2017 17:20 Full Code [id number]  Truett Mainland, DO ED   02/01/2017 15:52 02/02/2017 03:10 Full Code [id number]  Arne Cleveland, MD HOV   09/19/2016 22:36 09/23/2016 16:45 Full Code [id number]  Ivor Costa, MD Inpatient   05/15/2015 17:53 05/17/2015 13:50 Full Code [id number]  Frederik Pear, MD Inpatient   08/07/2014 10:00 08/08/2014 14:40 Full Code [id number]  Wilhelmina Mcardle, MD Inpatient   08/06/2014 14:51 08/07/2014 10:00 Full Code [id number]  Sandi Mariscal, MD Inpatient   08/05/2014 20:48 08/06/2014 14:51 Full Code [id number]  Marybelle Killings, MD Inpatient   08/05/2014 17:30 08/05/2014 20:48 Full Code [id number]  Doree Fudge, MD ED   05/23/2014 15:27 05/25/2014 17:18 Full Code [id number]  Kerin Salen, MD Inpatient    Advance Directive Documentation     Most Recent Value  Type of Advance Directive  Healthcare Power of Attorney, Living will  Pre-existing out of facility DNR order (yellow form or pink MOST form)  No data  "MOST" Form in Place?  No data       Prognosis:   guarded  Discharge Planning:  To be determined, pending further discussions, further hospital course.   Care plan was discussed with patient, daughter Modena Nunnery who is an Therapist, sports, wife, Physiological scientist.   Thank you for allowing the Palliative Medicine Team to assist in the care of this patient.   Time In: 8.30 Time  Out: 9.20 Total Time 50 Prolonged Time Billed  yes       Greater than 50%  of this time was spent counseling and coordinating care related to the above assessment and plan.  Loistine Chance, MD [phone] Please contact Palliative Medicine Team phone at [phone] for questions and concerns.

## 2018-01-09 NOTE — Progress Notes (Signed)
PT states he just completed Flutter therapy- PC at this time per wife.

## 2018-01-09 NOTE — Progress Notes (Signed)
PROGRESS NOTE    Nicholas Hale  MBE:[id number] DOB: Jun 09, 1946 DOA: 01/05/2018 PCP: [name], MD    Brief Narrative:   Nicholas Hale is a 72 y.o. male with a history of DVT/PE on xarelto and stage IV NSCLC with recent admission for pneumonia and MSSA bacteremia discharged 1/25 on ancef thru PICC through 2/2. After initial improvement he noted constant, progressive, now severe dyspnea associated with increasing weakness and nonproductive cough with pleuritic chest pain. No fevers. He fell due to weakness getting into bed this morning prompting return to ED where he was ill-appearing, dyspneic and placed on oxygen. CXR suggested progressive LLL opacity, confirmed by CTA chest to be progressive airspace disease beyond narrowed LLL and lingular bronchi narrowed by hilar mass. Blood cultures drawn, vancomycin and zosyn were started, and hospitalists called to admit for postobstructive pneumonia. The patient's oncologist, Dr. [name] was called for consult.     Assessment & Plan:   Principal Problem:   Postobstructive pneumonia Active Problems:   Adenocarcinoma of left lung, stage 4 (HCC)   Thrombocytopenia (HCC)   Anemia   Sepsis due to pneumonia ([place])   Hypokalemia   Elevated TSH   Encounter for palliative care  1 sepsis secondary to postobstructive left lower lobe pneumonia Patient on admission met criteria for sepsis secondary to tachycardia, tachypnea, leukocytosis, CT scan concerning for postobstructive pneumonia with progression of lung disease in the left hilar region resulting in near complete left lower lung obstruction/collapse.  Patient currently afebrile.  MRSA PCR negative.  Blood cultures pending no growth to date times 4 days.  Sputum Gram stain and cultures pending.  This patient on scheduled Pulmicort, scheduled Xopenex and Atrovent nebs as well as as needed nebs.  Continue Mucinex.  Continue empiric IV Zosyn.  Discontinued IV vancomycin.  Patient has been seen in  consultation by pulmonary, Dr. [name] who feels no role for bronchoscopy at this time and recommending continued IV antibiotics, palliative care consultation, consultation with oncology and rad/onc for palliative radiation.  Patient has been seen by radiation oncology who recommended some palliative radiation started on 01/07/2018.  Patient has also been seen by his oncologist Dr. [name] who is in agreement with retreatment with radiation if possible and if there is improvement in patient's condition may consider resuming systemic chemotherapy.  Appreciate pulmonary's input and recommendations.  Oncology and rad/oncology following.  2.  Stage IV non-small cell lung cancer: Status post XRT completed November 2018 and last chemotherapy December 2018. Patient with history of malignant pericardial effusion.  No current effusion noted at this time.  Patient with progression of disease leading to postobstructive pneumonia.  Patient has been seen by radiation oncology who have started palliative radiation.  Patient has also been seen by his oncologist, Dr. [name] who is in agreement with retreatment with radiation if possible and if there is improvement in patient's condition may consider resuming his systemic chemotherapy.  Palliative care has assessed the patient, family at this time wanting to proceed with palliative radiation first prior to deciding as to whether to shift focus to full comfort.  Patient receiving palliative radiation. Palliative care met with family again today 01/09/2018.  3.  History of DVT/PE CT angiogram chest negative for any acute PE.  Patient with no overt bleeding.  Continue anticoagulation with Xarelto.  Follow.  4.  Thrombocytopenia Platelet count slowly trending back up.  No signs of bleeding.  Follow.   5.  Anemia of chronic disease Hemoglobin stable  at 9.3.  Transfusion threshold hemoglobin less than 7.    6.  Elevated TSH During recent admission TSH was 17  with a normal free T4.  Patient will need repeat thyroid function studies done in about 4-6 weeks in the outpatient setting with PCP.  7.  Hypocalcemia Patient given a dose of calcium gluconate on admission.  Follow.  8.  Hypokalemia/hypomagnesemia Replete.  Keep magnesium greater than 2.  Follow.    DVT prophylaxis: xarelto Code Status: DNR Family Communication: updated patient and son and wife at bedside. Disposition Plan: To be determined.  Likely back home.   Consultants:   Pulmonary: Dr. [name] 01/06/2018  Oncology: Dr. [name] 01/06/2018  Radiation oncology  Palliative care: Dr.Anwar 01/07/2018  Procedures:   CT angiogram chest 01/05/2018  Chest x-ray 01/05/2018    Antimicrobials:   IV Zosyn 01/05/2018  IV vancomycin 01/05/2018>>>>> 01/07/2018   Subjective: Patient sleeping however easily arousable.  Patient complaining of inability to get congestion up.  Patient states no significant change or shortness of breath.  Complaining of pain with coughing.    Objective: Vitals:   01/08/18 1952 01/08/18 2124 01/09/18 0539 01/09/18 0914  BP:  137/70 (!) 129/7   Pulse:  (!) 123 (!) 110   Resp:  (!) 22 20   Temp:  98.2 F (36.8 C) 98.5 F (36.9 C)   TempSrc:  Oral Oral   SpO2: 92% 91% 94% 97%  Weight:      Height:        Intake/Output Summary (Last 24 hours) at 01/09/2018 1214 Last data filed at 01/08/2018 2000 Gross per 24 hour  Intake 440 ml  Output -  Net 440 ml   Filed Weights   01/05/18 1713  Weight: 82.2 kg (181 lb 3.5 oz)    Examination:  General exam: Sleeping. Pale.   Respiratory system: Some coarse BS. No use of accessory muscles of respiration. Cardiovascular system: RRR, no m/r/g. No lower extremity edema.  No JVD.  Gastrointestinal system: Abdomen is nontender, nondistended, soft, positive bowel sounds.  No hepatosplenomegaly.   Central nervous system: Alert and oriented. No focal neurological deficits. Extremities: Symmetric 5 x 5  power. Skin: No rashes, lesions or ulcers Psychiatry: Judgement and insight appear normal. Mood & affect appropriate.     Data Reviewed: I have personally reviewed following labs and imaging studies  CBC: Recent Labs  Lab 01/05/18 1026 01/05/18 1046 01/06/18 0904 01/07/18 0634 01/08/18 0632 01/09/18 0558  WBC 20.7*  --  15.1* 15.3* 13.6* 15.1*  NEUTROABS 17.9*  --  13.5* 13.3*  --   --   HGB 10.8* 10.5* 9.3* 9.0* 9.3* 9.3*  HCT 34.1* 31.0* 30.4* 28.9* 29.6* 28.7*  MCV 102.4*  --  104.1* 102.5* 101.7* 100.3*  PLT 126*  --  121* 123* 124* 169*   Basic Metabolic Panel: Recent Labs  Lab 01/05/18 1026 01/05/18 1046 01/06/18 0904 01/07/18 0634 01/08/18 0632 01/09/18 0558  NA 139 139 136 135 138 136  K 3.3* 3.4* 3.3* 3.4* 3.6 3.5  CL 103 102 106 104 107 104  CO2 25  --  _0 GLUCOSE 163* 164* 208* 163* 124* 116*  BUN _1 CREATININE 1.07 0.90 0.88 0.90 0.85 0.88  CALCIUM 8.3*  --  8.0* 8.0* 8.1* 8.1*  MG  --   --  1.5* 1.9  --  1.6*   GFR: Estimated Creatinine Clearance: 79.5 mL/min (by C-G formula based on SCr  of 0.88 mg/dL). Liver Function Tests: Recent Labs  Lab 01/05/18 1026  AST 63*  ALT 19  ALKPHOS 89  BILITOT 0.5  PROT 6.7  ALBUMIN 2.1*   No results for input(s): LIPASE, AMYLASE in the last 168 hours. No results for input(s): AMMONIA in the last 168 hours. Coagulation Profile: No results for input(s): INR, PROTIME in the last 168 hours. Cardiac Enzymes: No results for input(s): CKTOTAL, CKMB, CKMBINDEX, TROPONINI in the last 168 hours. BNP (last 3 results) No results for input(s): PROBNP in the last 8760 hours. HbA1C: No results for input(s): HGBA1C in the last 72 hours. CBG: No results for input(s): GLUCAP in the last 168 hours. Lipid Profile: No results for input(s): CHOL, HDL, LDLCALC, TRIG, CHOLHDL, LDLDIRECT in the last 72 hours. Thyroid Function Tests: No results for input(s): TSH, T4TOTAL, FREET4, T3FREE, THYROIDAB in  the last 72 hours. Anemia Panel: No results for input(s): VITAMINB12, FOLATE, FERRITIN, TIBC, IRON, RETICCTPCT in the last 72 hours. Sepsis Labs: Recent Labs  Lab 01/05/18 1046 01/05/18 1525  LATICACIDVEN 1.09 0.99    Recent Results (from the past 240 hour(s))  Blood Culture (routine x 2)     Status: None (Preliminary result)   Collection Time: 01/05/18 10:26 AM  Result Value Ref Range Status   Specimen Description   Final    BLOOD LEFT ANTECUBITAL Performed at [facility] [address] [place], [place] [address]    Special Requests   Final    BOTTLES DRAWN AEROBIC AND ANAEROBIC Blood Culture adequate volume Performed at [facility] [address] [place], [place] [address]    Culture   Final    NO GROWTH 4 DAYS Performed at [facility], [place] [address] [place], [place] [address]    Report Status PENDING  Incomplete  Blood Culture (routine x 2)     Status: None (Preliminary result)   Collection Time: 01/05/18 10:30 AM  Result Value Ref Range Status   Specimen Description   Final    BLOOD LEFT WRIST Performed at [facility] [address] [place], [place] [address]    Special Requests   Final    BOTTLES DRAWN AEROBIC AND ANAEROBIC Blood Culture adequate volume Performed at [facility] [address], [place], [place] [address]    Culture   Final    NO GROWTH 4 DAYS Performed at [facility], [place] [address] [place], [place] [address]    Report Status PENDING  Incomplete  MRSA PCR Screening     Status: None   Collection Time: 01/06/18 12:08 PM  Result Value Ref Range Status   MRSA by PCR NEGATIVE NEGATIVE Final    Comment:        The GeneXpert MRSA Assay (FDA approved for NASAL specimens only), is one component of a comprehensive MRSA colonization surveillance program. It is not intended to diagnose MRSA infection nor to guide or monitor treatment for MRSA  infections. Performed at [facility], [place] [address] [place], [place] [address]          Radiology Studies: No results found.      Scheduled Meds: . atorvastatin  20 mg Oral q1800  . budesonide (PULMICORT) nebulizer solution  0.5 mg Nebulization BID  . dextromethorphan-guaiFENesin  2 tablet Oral BID  . feeding supplement (ENSURE ENLIVE)  237 mL Oral BID BM  . heparin lock flush  250 Units Intracatheter Daily  . ipratropium  0.5 mg Nebulization Q6H WA  . levalbuterol  0.63 mg Nebulization Q6H WA  . mirtazapine  7.5 mg Oral QHS  . oxyCODONE-acetaminophen  1 tablet Oral Once  . rivaroxaban  20 mg Oral Q supper  . senna-docusate  1 tablet Oral QHS  . sucralfate  1 g Oral TID WC & HS   Continuous Infusions: . magnesium sulfate 1 - 4 g bolus IVPB 4 g (01/09/18 1052)  . piperacillin-tazobactam (ZOSYN)  IV 3.375 g (01/09/18 1035)     LOS: 4 days    Time spent: 60 mins    Irine Seal, MD [facility] Pager [phone]  If 7PM-7AM, please contact night-coverage [email or web address] Password [email or web address] 01/09/2018, 12:14 PM

## 2018-01-09 NOTE — Progress Notes (Signed)
PT demonstrated hands on ability to utilize Flutter device. PC at tis time. PT states his throat pain is better at this time.

## 2018-01-10 ENCOUNTER — Ambulatory Visit
Admit: 2018-01-10 | Discharge: 2018-01-10 | Disposition: A | Discharge status: Home / Self Care | Payer: Medicare Other | Attending: Radiation Oncology | Admitting: Radiation Oncology

## 2018-01-10 LAB — CBC WITH DIFFERENTIAL/PLATELET
Basophils Absolute: 0 10*3/uL (ref 0.0–0.1)
Basophils Relative: 0 %
EOS ABS: 0 10*3/uL (ref 0.0–0.7)
Eosinophils Relative: 0 %
HEMATOCRIT: 29.5 % — AB (ref 39.0–52.0)
HEMOGLOBIN: 9.2 g/dL — AB (ref 13.0–17.0)
LYMPHS ABS: 0.8 10*3/uL (ref 0.7–4.0)
LYMPHS PCT: 6 %
MCH: 32.3 pg (ref 26.0–34.0)
MCHC: 31.2 g/dL (ref 30.0–36.0)
MCV: 103.5 fL — AB (ref 78.0–100.0)
MONOS PCT: 8 %
Monocytes Absolute: 1.2 10*3/uL — ABNORMAL HIGH (ref 0.1–1.0)
NEUTROS PCT: 86 %
Neutro Abs: 12.4 10*3/uL — ABNORMAL HIGH (ref 1.7–7.7)
Platelets: 116 10*3/uL — ABNORMAL LOW (ref 150–400)
RBC: 2.85 MIL/uL — ABNORMAL LOW (ref 4.22–5.81)
RDW: 18.8 % — ABNORMAL HIGH (ref 11.5–15.5)
WBC: 14.4 10*3/uL — ABNORMAL HIGH (ref 4.0–10.5)

## 2018-01-10 LAB — BASIC METABOLIC PANEL
ANION GAP: 10 (ref 5–15)
BUN: 15 mg/dL (ref 6–20)
CALCIUM: 8.1 mg/dL — AB (ref 8.9–10.3)
CO2: 25 mmol/L (ref 22–32)
CREATININE: 0.89 mg/dL (ref 0.61–1.24)
Chloride: 105 mmol/L (ref 101–111)
GLUCOSE: 106 mg/dL — AB (ref 65–99)
Potassium: 3.5 mmol/L (ref 3.5–5.1)
Sodium: 140 mmol/L (ref 135–145)

## 2018-01-10 LAB — CULTURE, BLOOD (ROUTINE X 2)
CULTURE: NO GROWTH
Culture: NO GROWTH
SPECIAL REQUESTS: ADEQUATE
Special Requests: ADEQUATE

## 2018-01-10 LAB — PROCALCITONIN: Procalcitonin: 41.29 ng/mL

## 2018-01-10 LAB — MAGNESIUM: MAGNESIUM: 2 mg/dL (ref 1.7–2.4)

## 2018-01-10 MED ORDER — SODIUM CHLORIDE 0.9% FLUSH
10.0000 mL | INTRAVENOUS | Status: DC | PRN
  Administered 2018-01-12: 10 mL
  Filled 2018-01-10: qty 40

## 2018-01-10 MED ORDER — SODIUM CHLORIDE 3 % IN NEBU
4.0000 mL | INHALATION_SOLUTION | Freq: Two times a day (BID) | RESPIRATORY_TRACT | Status: AC
  Administered 2018-01-10 – 2018-01-12 (×4): 4 mL via RESPIRATORY_TRACT
  Filled 2018-01-10 (×6): qty 4

## 2018-01-10 NOTE — Progress Notes (Signed)
Physical Therapy Treatment Patient Details Name: Nicholas Hale MRN: [id number] DOB: 1946-01-02 Today's Date: 01/10/2018    History of Present Illness 72 yo male admitted with postobstructive pneumonia, sepsis, tumor progression. Hx of bil THR, cervical fusion, lung ca-on chemo/radiation, MSSA bacteremia    PT Comments    Assisted pt out of bathroom and back to bed. He declined hallway ambulation on today. He appeared fatigued and dyspneic after short walk. Assisted pt back to bed and replaced  O2. Will continue to follow.    Follow Up Recommendations  Home health PT vs hospice (per chart)     Equipment Recommendations  None recommended by PT    Recommendations for Other Services       Precautions / Restrictions Precautions Precautions: Fall Restrictions Weight Bearing Restrictions: No    Mobility  Bed Mobility Overal bed mobility: Needs Assistance Bed Mobility: Sit to Supine       Sit to supine: Modified independent (Device/Increase time)   General bed mobility comments: no assist given. increased time  Transfers Overall transfer level: Needs assistance Equipment used: Rolling walker (2 wheeled) Transfers: Sit to/from Stand Sit to Stand: Min guard         General transfer comment: close guard for safety. VCs hand placement  Ambulation/Gait Ambulation/Gait assistance: Min guard Ambulation Distance (Feet): 15 Feet Assistive device: Rolling walker (2 wheeled) Gait Pattern/deviations: Step-through pattern;Decreased stride length     General Gait Details: close guard for safety. pt fatigues easily. He did not feel he could walk in hallway on today. dyspnea 2/4   Stairs            Wheelchair Mobility    Modified Rankin (Stroke Patients Only)       Balance                                            Cognition Arousal/Alertness: Awake/alert Behavior During Therapy: WFL for tasks assessed/performed Overall Cognitive Status:  Within Functional Limits for tasks assessed                                        Exercises      General Comments        Pertinent Vitals/Pain Pain Assessment: Faces Faces Pain Scale: Hurts little more Pain Location: R pelvic/hip area; back Pain Descriptors / Indicators: Sore;Aching Pain Intervention(s): Monitored during session    Home Living                      Prior Function            PT Goals (current goals can now be found in the care plan section) Progress towards PT goals: Progressing toward goals    Frequency    Min 3X/week      PT Plan Current plan remains appropriate    Co-evaluation              AM-PAC PT "6 Clicks" Daily Activity  Outcome Measure  Difficulty turning over in bed (including adjusting bedclothes, sheets and blankets)?: A Little Difficulty moving from lying on back to sitting on the side of the bed? : A Little Difficulty sitting down on and standing up from a chair with arms (e.g., wheelchair, bedside commode, etc,.)?: A Little Help needed  moving to and from a bed to chair (including a wheelchair)?: A Little Help needed walking in hospital room?: A Little Help needed climbing 3-5 steps with a railing? : A Little 6 Click Score: 18    End of Session   Activity Tolerance: Patient limited by fatigue Patient left: in bed;with call bell/phone within reach;with family/visitor present   PT Visit Diagnosis: Muscle weakness (generalized) (M62.81);Difficulty in walking, not elsewhere classified (R26.2)     Time: 3943-2003 PT Time Calculation (min) (ACUTE ONLY): 8 min  Charges:  $Gait Training: 8-22 mins                    G Codes:          Weston Anna, MPT Pager: [phone]

## 2018-01-10 NOTE — Progress Notes (Signed)
Date:  January 10, 2018 Chart reviewed for concurrent status and case management needs.  Will continue to follow patient progress.  Discharge Planning: following for needs.  None present at this time of review. Expected discharge date: January 13, 2018 Velva Harman, BSN, [place], [place]

## 2018-01-10 NOTE — Progress Notes (Signed)
PROGRESS NOTE    Nicholas Hale  TGY:[id number] DOB: 09-29-1946 DOA: 01/05/2018 PCP: [name], MD    Brief Narrative:   Nicholas Hale is a 72 y.o. male with a history of DVT/PE on xarelto and stage IV NSCLC with recent admission for pneumonia and MSSA bacteremia discharged 1/25 on ancef thru PICC through 2/2. After initial improvement he noted constant, progressive, now severe dyspnea associated with increasing weakness and nonproductive cough with pleuritic chest pain. No fevers. He fell due to weakness getting into bed this morning prompting return to ED where he was ill-appearing, dyspneic and placed on oxygen. CXR suggested progressive LLL opacity, confirmed by CTA chest to be progressive airspace disease beyond narrowed LLL and lingular bronchi narrowed by hilar mass. Blood cultures drawn, vancomycin and zosyn were started, and hospitalists called to admit for postobstructive pneumonia. The patient's oncologist, Dr. [name] was called for consult.     Assessment & Plan:   Principal Problem:   Postobstructive pneumonia Active Problems:   Adenocarcinoma of left lung, stage 4 (HCC)   Thrombocytopenia (HCC)   Anemia   Sepsis due to pneumonia ([place])   Hypokalemia   Elevated TSH   Encounter for palliative care  1 sepsis secondary to postobstructive left lower lobe pneumonia Patient on admission met criteria for sepsis secondary to tachycardia, tachypnea, leukocytosis, CT scan concerning for postobstructive pneumonia with progression of lung disease in the left hilar region resulting in near complete left lower lung obstruction/collapse.  Patient currently afebrile.  MRSA PCR negative.  Blood cultures negative to date.  Sputum Gram stain and cultures pending.  This patient on scheduled Pulmicort, scheduled Xopenex and Atrovent nebs as well as as needed nebs.  Continue Mucinex.  Continue empiric IV Zosyn.  Discontinued IV vancomycin.  Will likely transition to oral antibiotics in  the next few days and continue antibiotics until obstruction has been likely relieved with radiation.  Check a pro-calcitonin level.  Patient has been seen in consultation by pulmonary, Dr. [name] who feels no role for bronchoscopy at this time and recommending continued IV antibiotics, palliative care consultation, consultation with oncology and rad/onc for palliative radiation.  Patient has been seen by radiation oncology who recommended some palliative radiation started on 01/07/2018.  Patient has also been seen by his oncologist Dr. [name] who is in agreement with retreatment with radiation if possible and if there is improvement in patient's condition may consider resuming systemic chemotherapy.  Appreciate pulmonary's input and recommendations.  Oncology and rad/oncology following.  2.  Stage IV non-small cell lung cancer: Status post XRT completed November 2018 and last chemotherapy December 2018. Patient with history of malignant pericardial effusion.  No current effusion noted at this time.  Patient with progression of disease leading to postobstructive pneumonia.  Patient has been seen by radiation oncology who have started palliative radiation.  Patient has also been seen by his oncologist, Dr. [name] who is in agreement with retreatment with radiation if possible and if there is improvement in patient's condition may consider resuming his systemic chemotherapy.  Palliative care has assessed the patient, family at this time wanting to proceed with palliative radiation first prior to deciding as to whether to shift focus to full comfort.  Patient receiving palliative radiation. Palliative care met with family again 01/09/2018.  Will need outpatient follow-up with oncology.  3.  History of DVT/PE CT angiogram chest negative for any acute PE.  Patient with no overt bleeding.  Continue anticoagulation with Xarelto.  Follow.  4.  Thrombocytopenia Patient with no overt bleeding.  Follow.    5.  Anemia of chronic disease Hemoglobin stable at 9.2.  Transfusion threshold hemoglobin less than 7.    6.  Elevated TSH During recent admission TSH was 17 with a normal free T4.  Patient will need repeat thyroid function studies done in about 4-6 weeks in the outpatient setting with PCP.  7.  Hypocalcemia Patient given a dose of calcium gluconate on admission.  Follow.  8.  Hypokalemia/hypomagnesemia Replete.  Keep magnesium greater than 2.  Follow.    DVT prophylaxis: xarelto Code Status: DNR Family Communication: updated patient and wife at bedside. Disposition Plan: To be determined.  Likely back home with home health.   Consultants:   Pulmonary: Dr. [name] 01/06/2018  Oncology: Dr. [name] 01/06/2018  Radiation oncology  Palliative care: Dr.Anwar 01/07/2018  Procedures:   CT angiogram chest 01/05/2018  Chest x-ray 01/05/2018    Antimicrobials:   IV Zosyn 01/05/2018  IV vancomycin 01/05/2018>>>>> 01/07/2018   Subjective: Patient laying in bed.  States no significant change or shortness of breath.  Complaining of unable to get mucus up.  No chest pain.    Objective: Vitals:   01/09/18 1500 01/09/18 2003 01/09/18 2053 01/10/18 0449  BP: 136/68  127/62 130/75  Pulse: (!) 110  (!) 107 (!) 115  Resp: _0 Temp: 97.9 F (36.6 C)  97.8 F (36.6 C) 97.7 F (36.5 C)  TempSrc: Oral  Oral Oral  SpO2: 96% 96% 98% 95%  Weight:      Height:        Intake/Output Summary (Last 24 hours) at 01/10/2018 1056 Last data filed at 01/10/2018 4356 Gross per 24 hour  Intake 50 ml  Output -  Net 50 ml   Filed Weights   01/05/18 1713  Weight: 82.2 kg (181 lb 3.5 oz)    Examination:  General exam: Pale.   Respiratory system: Scattered rhonchi.  No wheezing.   Cardiovascular system: Regular rate rhythm no murmurs rubs or gallops.  No lower extremity edema.  No JVD.  Gastrointestinal system: Abdomen is soft, nontender, nondistended, positive bowel sounds.  No  hepatosplenomegaly.   Central nervous system: Alert and oriented. No focal neurological deficits. Extremities: Symmetric 5 x 5 power. Skin: No rashes, lesions or ulcers Psychiatry: Judgement and insight appear normal. Mood & affect appropriate.     Data Reviewed: I have personally reviewed following labs and imaging studies  CBC: Recent Labs  Lab 01/05/18 1026  01/06/18 0904 01/07/18 0634 01/08/18 0632 01/09/18 0558 01/10/18 0833  WBC 20.7*  --  15.1* 15.3* 13.6* 15.1* 14.4*  NEUTROABS 17.9*  --  13.5* 13.3*  --   --  12.4*  HGB 10.8*   < > 9.3* 9.0* 9.3* 9.3* 9.2*  HCT 34.1*   < > 30.4* 28.9* 29.6* 28.7* 29.5*  MCV 102.4*  --  104.1* 102.5* 101.7* 100.3* 103.5*  PLT 126*  --  121* 123* 124* 131* 116*   < > = values in this interval not displayed.   Basic Metabolic Panel: Recent Labs  Lab 01/06/18 0904 01/07/18 0634 01/08/18 0632 01/09/18 0558 01/10/18 0806  NA 136 135 138 136 140  K 3.3* 3.4* 3.6 3.5 3.5  CL 106 104 107 104 105  CO2 _1 GLUCOSE 208* 163* 124* 116* 106*  BUN _2 CREATININE 0.88 0.90 0.85 0.88 0.89  CALCIUM 8.0*  8.0* 8.1* 8.1* 8.1*  MG 1.5* 1.9  --  1.6* 2.0   GFR: Estimated Creatinine Clearance: 78.6 mL/min (by C-G formula based on SCr of 0.89 mg/dL). Liver Function Tests: Recent Labs  Lab 01/05/18 1026  AST 63*  ALT 19  ALKPHOS 89  BILITOT 0.5  PROT 6.7  ALBUMIN 2.1*   No results for input(s): LIPASE, AMYLASE in the last 168 hours. No results for input(s): AMMONIA in the last 168 hours. Coagulation Profile: No results for input(s): INR, PROTIME in the last 168 hours. Cardiac Enzymes: No results for input(s): CKTOTAL, CKMB, CKMBINDEX, TROPONINI in the last 168 hours. BNP (last 3 results) No results for input(s): PROBNP in the last 8760 hours. HbA1C: No results for input(s): HGBA1C in the last 72 hours. CBG: No results for input(s): GLUCAP in the last 168 hours. Lipid Profile: No results for input(s): CHOL,  HDL, LDLCALC, TRIG, CHOLHDL, LDLDIRECT in the last 72 hours. Thyroid Function Tests: No results for input(s): TSH, T4TOTAL, FREET4, T3FREE, THYROIDAB in the last 72 hours. Anemia Panel: No results for input(s): VITAMINB12, FOLATE, FERRITIN, TIBC, IRON, RETICCTPCT in the last 72 hours. Sepsis Labs: Recent Labs  Lab 01/05/18 1046 01/05/18 1525  LATICACIDVEN 1.09 0.99    Recent Results (from the past 240 hour(s))  Blood Culture (routine x 2)     Status: None (Preliminary result)   Collection Time: 01/05/18 10:26 AM  Result Value Ref Range Status   Specimen Description   Final    BLOOD LEFT ANTECUBITAL Performed at [facility] [address], [place], [place] [address]    Special Requests   Final    BOTTLES DRAWN AEROBIC AND ANAEROBIC Blood Culture adequate volume Performed at [facility] [address] [place], [place] [address]    Culture   Final    NO GROWTH 4 DAYS Performed at [facility], [place] [address] [place], [place] [address]    Report Status PENDING  Incomplete  Blood Culture (routine x 2)     Status: None (Preliminary result)   Collection Time: 01/05/18 10:30 AM  Result Value Ref Range Status   Specimen Description   Final    BLOOD LEFT WRIST Performed at [facility] [address] [place], [place] [address]    Special Requests   Final    BOTTLES DRAWN AEROBIC AND ANAEROBIC Blood Culture adequate volume Performed at [facility] [address], [place], [place] [address]    Culture   Final    NO GROWTH 4 DAYS Performed at [facility], [place] [address], [place],  [address]    Report Status PENDING  Incomplete  MRSA PCR Screening     Status: None   Collection Time: 01/06/18 12:08 PM  Result Value Ref Range Status   MRSA by PCR NEGATIVE NEGATIVE Final    Comment:        The GeneXpert MRSA Assay (FDA approved for NASAL specimens only), is one component of  a comprehensive MRSA colonization surveillance program. It is not intended to diagnose MRSA infection nor to guide or monitor treatment for MRSA infections. Performed at [facility], [place] [address], [place],  [address]          Radiology Studies: No results found.      Scheduled Meds: . atorvastatin  20 mg Oral q1800  . budesonide (PULMICORT) nebulizer solution  0.5 mg Nebulization BID  . dextromethorphan-guaiFENesin  2 tablet  Oral BID  . feeding supplement (ENSURE ENLIVE)  237 mL Oral BID BM  . heparin lock flush  250 Units Intracatheter Daily  . ipratropium  0.5 mg Nebulization TID  . levalbuterol  0.63 mg Nebulization TID  . mirtazapine  7.5 mg Oral QHS  . oxyCODONE-acetaminophen  1 tablet Oral Once  . rivaroxaban  20 mg Oral Q supper  . senna-docusate  1 tablet Oral QHS  . sodium chloride HYPERTONIC  4 mL Nebulization BID  . sucralfate  1 g Oral TID WC & HS   Continuous Infusions: . piperacillin-tazobactam (ZOSYN)  IV Stopped (01/10/18 0519)     LOS: 5 days    Time spent: 35 mins    Irine Seal, MD [facility] Pager [phone]  If 7PM-7AM, please contact night-coverage [email or web address] Password [facility] 01/10/2018, 10:56 AM

## 2018-01-10 NOTE — Progress Notes (Signed)
Daily Progress Note   Patient Name: Nicholas Hale       Date: 01/10/2018 DOB: Sep 07, 1946  Age: 72 y.o. MRN#: [id number] Attending Physician: Eugenie Filler, MD Primary Care Physician: [name], MD Admit Date: 01/05/2018  Reason for Consultation/Follow-up: Establishing goals of care, Non pain symptom management, Pain control and Psychosocial/spiritual support  Subjective: Patient is in bed and alert this evening. Wife at bedside. Reports he was able to get some rest last night. Continues to have some generalized weakness, and lots of symptom burden from cough and pain in chest wall. Continuing to use flutter valve and incentive spirometer to help facilitate coughing up mucous. He reports going for palliative radiation consult with Dr. Sondra Come on today. Patient and wife verbalized that he will continue with palliative radiation to hilar area.    Length of Stay: 5  Current Medications: Scheduled Meds:  . atorvastatin  20 mg Oral q1800  . budesonide (PULMICORT) nebulizer solution  0.5 mg Nebulization BID  . dextromethorphan-guaiFENesin  2 tablet Oral BID  . feeding supplement (ENSURE ENLIVE)  237 mL Oral BID BM  . heparin lock flush  250 Units Intracatheter Daily  . ipratropium  0.5 mg Nebulization TID  . levalbuterol  0.63 mg Nebulization TID  . mirtazapine  7.5 mg Oral QHS  . oxyCODONE-acetaminophen  1 tablet Oral Once  . rivaroxaban  20 mg Oral Q supper  . senna-docusate  1 tablet Oral QHS  . sodium chloride HYPERTONIC  4 mL Nebulization BID  . sucralfate  1 g Oral TID WC & HS    Continuous Infusions: . piperacillin-tazobactam (ZOSYN)  IV 3.375 g (01/10/18 1109)    PRN Meds: acetaminophen **OR** acetaminophen, [DISCONTINUED] ipratropium **AND** albuterol,  guaiFENesin-dextromethorphan, heparin lock flush **AND** heparin lock flush, HYDROcodone-homatropine, ketorolac, oxyCODONE, phenol, polyethylene glycol, prochlorperazine, sodium chloride, sodium chloride flush  Physical Exam  Constitutional: He appears well-developed. He is cooperative.  Pulmonary/Chest: Effort normal.  Cough at times   Musculoskeletal:       Right shoulder: He exhibits decreased strength.  Generalized weakness   Neurological: He is alert.  Awake and watching tv, engaging in conversation Resting in bed No edema Abdomen does not appear distended           Vital Signs: BP 138/83   Pulse (!) 109  Temp 98.3 F (36.8 C) (Oral)   Resp 20   Ht 5\' 10"  (1.778 m)   Wt 82.2 kg (181 lb 3.5 oz)   SpO2 100%   BMI 26.00 kg/m  SpO2: SpO2: 100 % O2 Device: O2 Device: Nasal Cannula O2 Flow Rate: O2 Flow Rate (L/min): 2 L/min  Intake/output summary:   Intake/Output Summary (Last 24 hours) at 01/10/2018 1608 Last data filed at 01/10/2018 7681 Gross per 24 hour  Intake 50 ml  Output -  Net 50 ml   LBM: Last BM Date: 01/09/18 Baseline Weight: Weight: 82.2 kg (181 lb 3.5 oz) Most recent weight: Weight: 82.2 kg (181 lb 3.5 oz)       Palliative Assessment/Data: PPS 30%   Flowsheet Rows     Most Recent Value  Intake Tab  Referral Department  Hospitalist  Unit at Time of Referral  Oncology Unit  Palliative Care Primary Diagnosis  Cancer  Palliative Care Type  New Palliative care  Reason for referral  Clarify Goals of Care  Date first seen by Palliative Care  01/06/18  Clinical Assessment  Palliative Performance Scale Score  40%  Pain Max last 24 hours  4  Pain Min Last 24 hours  3  Dyspnea Max Last 24 Hours  6  Dyspnea Min Last 24 hours  4  Nausea Max Last 24 Hours  2  Nausea Min Last 24 Hours  1  Anxiety Max Last 24 Hours  5  Anxiety Min Last 24 Hours  4  Psychosocial & Spiritual Assessment  Palliative Care Outcomes  Patient/Family meeting held?  Yes  Who  was at the meeting?  patient, wife, daughter.   Palliative Care Outcomes  Clarified goals of care      Patient Active Problem List   Diagnosis Date Noted  . Encounter for palliative care   . Hypokalemia   . Elevated TSH   . Postobstructive pneumonia 01/05/2018  . Sepsis due to pneumonia ([place]) 01/05/2018  . 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 Staphylococcus aureus bacteremia   . Bacteremia   . Malnutrition of moderate degree 12/17/2017  . Pneumonia 12/14/2017  . Leukocytosis 12/14/2017  . Anemia 12/14/2017  . Hyponatremia 12/14/2017  . HCAP (healthcare-associated pneumonia)   . Influenza vaccination administered at current visit 09/02/2017  . Gingival bleeding 08/21/2017  . Thrombocytopenia ([place]) 08/21/2017  . Dyspnea on exertion 04/05/2017  . Chemotherapy induced neutropenia ([place]) 03/31/2017  . Port catheter in place 03/03/2017  . Encounter for antineoplastic chemotherapy 02/17/2017  . Adenocarcinoma of left lung, stage 4 ([place]) 10/05/2016  . Goals of care, counseling/discussion 10/05/2016  . Chronic fatigue 10/05/2016  . Lung cancer ([place]) 09/25/2016  . Acute deep vein thrombosis (DVT) of proximal vein of both lower extremities (HCC)   . Cardiac tamponade   . Elevated troponin 09/20/2016  . 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 Ventricular tachycardia ([place]) 09/19/2016  . Renal cyst, right 09/19/2016  . Liver lesion 09/19/2016  . Nonsustained ventricular tachycardia ([place])   . History of tobacco abuse 01/28/2016  . Primary osteoarthritis of right hip 05/10/2015  . 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 Arthritis of left hip 05/23/2014  . Hypertension 05/15/2014  . Hyperlipidemia 05/15/2014    Palliative Care Assessment & Plan   Patient Profile: 72 year old gentleman with a past medical history significant for metastatic non-small cell lung cancer, adenocarcinoma, patient is under the care of Dr. Rogue Jury, DVT/PE (Xarelto), HTN, and AAA. He  has undergone several chemotherapy  and immunotherapy regimens as well as palliative radiation. Most recently, the patient was placed on systemic chemotherapy and is now hospitalized with ongoing weakness, postobstructive pneumonia, CT scan of the chest now showing progressive soft tissue mass in the left hilum and mediastinum with almost complete obstruction of left lower lobe bronchus and lingular bronchus.    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 Worsening generalized weakness, deconditioning.  Sore throat Difficulty swallowing larger pills  Recommendations/Plan:  Chloraseptic throat spray to bedside, use PRN  Remeron Q HS to help with mood, appetite and sleep.   Oxycodone PRN for pain control   Hycodan PRN for cough   Continue with PRN nebulizers for wheezing and shortness of breath   Continue with palliative radiation treatments per Dr. Sondra Come  May need out patient palliative care at home or at cancer center.   Continue supportive care  Antibiotics and further hospital care as per my colleague Dr Grandville Silos.   Goals are not comfort-only at this point. Patient and family wish to continue current mode of care, remain hopeful for some degree of stabililization/recovery. Per Oncologist if improvement s/p radiation may consider systemic chemotherapy. After discharge, and after palliative radiation therapy is completed, should the patient have an ongoing increased symptom burden leading to an unacceptable further lowering of his quality of life, then, at that point, patient and family would like to instate hospice services at home. Continue with palliative care for now.    Goals of Care and  Additional Recommendations:  Limitations on Scope of Treatment: DNR with Full Scope Treatment  Code Status:    Code Status Orders  (From admission, onward)        Start     Ordered   01/05/18 2008  Do not attempt resuscitation (DNR)  Continuous    Question Answer Comment  In the event of cardiac or respiratory ARREST Do not call a "code blue"   In the event of cardiac or respiratory ARREST Do not perform Intubation, CPR, defibrillation or ACLS   In the event of cardiac or respiratory ARREST Use medication by any route, position, wound care, and other measures to relive pain and suffering. May use oxygen, suction and manual treatment of airway obstruction as needed for comfort.   Comments Confirmed with patient and family 2/6      01/05/18 2008    Code Status History    Date Active Date Inactive Code Status Order ID Comments User Context   01/05/2018 17:24 01/05/2018 19:59 DNR [id number]  Patrecia Pour, MD Inpatient   12/15/2017 08:19 12/24/2017 17:37 Full Code [id number]  Jani Gravel, MD ED   08/21/2017 13:39 08/22/2017 17:20 Full Code [id number]  Truett Mainland, DO ED   02/01/2017 15:52 02/02/2017 03:10 Full Code [id number]  Arne Cleveland, MD [place]   09/19/2016 22:36 09/23/2016 16:45 Full Code [id number]  Ivor Costa, MD Inpatient   05/15/2015 17:53 05/17/2015 13:50 Full Code [id number]  Frederik Pear, MD Inpatient   08/07/2014 10:00 08/08/2014 14:40 Full Code [id number]  Wilhelmina Mcardle, MD Inpatient   08/06/2014 14:51 08/07/2014 10:00 Full Code [id number]  Sandi Mariscal, MD Inpatient   08/05/2014 20:48 08/06/2014 14:51 Full Code [id number]  Marybelle Killings, MD Inpatient   08/05/2014 17:30 08/05/2014 20:48 Full  Code [id number]  Doree Fudge, MD ED   05/23/2014 15:27 05/25/2014 17:18 Full Code [id number]  Kerin Salen, MD Inpatient    Advance Directive Documentation     Most Recent Value  Type of Advance Directive  Healthcare Power of Attorney, Living will  Pre-existing out of facility DNR order (yellow form  or pink MOST form)  No data  "MOST" Form in Place?  No data      Prognosis:   Guarded potentially poor prognosis depending on s/p radiation.  Discharge Planning:  Home with Home Health and possible outpatient palliative vs. Cancer center services  Care plan was discussed with patient, wife, and Dr. Grandville Silos.  Thank you for allowing the Palliative Medicine Team to assist in the care of this patient.   Total Time 35 min Prolonged Time Billed  No      Greater than 50%  of this time was spent counseling and coordinating care related to the above assessment and plan.  Alda Lea, NP-C Palliative Medicine Team  Phone: [phone] Fax: [phone]  Please contact Palliative Medicine Team phone at [phone] for questions and concerns.   PMT addendum Patient seen and examined along with Ms Cousar, NP Agree with above note Awake alert In good spirits Rested well Labs vitals and imaging noted Has 8 total radiation trts, then to consider systemic chemo Likely home with home health on d/c for now Out patient palliative through my colleague Wadie Lessen NP at cancer center to be arranged NAD Diminished lung sounds R base worse than L S1 S2 Abdomen not distended No edema Non focal See above for detailed assessment and recommendations May need to uptitrate remeron if needed 35 minutes spent Loistine Chance MD [facility] health palliative medicine team [phone]

## 2018-01-10 NOTE — Progress Notes (Signed)
Pt continues to have strong, nonproductive cough. Medicated with hycodan syrup. Pt voices no other complaints, currently resting in bed with eyes closed. Assessment remains unchanged from prior documentation. Will continue to monitor at this time.

## 2018-01-11 ENCOUNTER — Ambulatory Visit
Admit: 2018-01-11 | Discharge: 2018-01-11 | Disposition: A | Discharge status: Home / Self Care | Payer: Medicare Other | Attending: Radiation Oncology | Admitting: Radiation Oncology

## 2018-01-11 LAB — CBC WITH DIFFERENTIAL/PLATELET
Basophils Absolute: 0 10*3/uL (ref 0.0–0.1)
Basophils Relative: 0 %
EOS ABS: 0 10*3/uL (ref 0.0–0.7)
EOS PCT: 0 %
HCT: 28.7 % — ABNORMAL LOW (ref 39.0–52.0)
Hemoglobin: 9 g/dL — ABNORMAL LOW (ref 13.0–17.0)
LYMPHS ABS: 0.6 10*3/uL — AB (ref 0.7–4.0)
LYMPHS PCT: 5 %
MCH: 32.6 pg (ref 26.0–34.0)
MCHC: 31.4 g/dL (ref 30.0–36.0)
MCV: 104 fL — AB (ref 78.0–100.0)
MONO ABS: 1.1 10*3/uL — AB (ref 0.1–1.0)
Monocytes Relative: 9 %
Neutro Abs: 10.5 10*3/uL — ABNORMAL HIGH (ref 1.7–7.7)
Neutrophils Relative %: 86 %
PLATELETS: 108 10*3/uL — AB (ref 150–400)
RBC: 2.76 MIL/uL — ABNORMAL LOW (ref 4.22–5.81)
RDW: 18.9 % — AB (ref 11.5–15.5)
WBC: 12.3 10*3/uL — ABNORMAL HIGH (ref 4.0–10.5)

## 2018-01-11 LAB — BASIC METABOLIC PANEL
Anion gap: 10 (ref 5–15)
BUN: 15 mg/dL (ref 6–20)
CO2: 25 mmol/L (ref 22–32)
CREATININE: 1.06 mg/dL (ref 0.61–1.24)
Calcium: 8 mg/dL — ABNORMAL LOW (ref 8.9–10.3)
Chloride: 104 mmol/L (ref 101–111)
GFR calc Af Amer: >60 mL/min (ref >60)
GLUCOSE: 128 mg/dL — AB (ref 65–99)
Potassium: 3.3 mmol/L — ABNORMAL LOW (ref 3.5–5.1)
SODIUM: 139 mmol/L (ref 135–145)

## 2018-01-11 LAB — MAGNESIUM: Magnesium: 1.7 mg/dL (ref 1.7–2.4)

## 2018-01-11 LAB — PROCALCITONIN: Procalcitonin: 45.89 ng/mL

## 2018-01-11 MED ORDER — POTASSIUM CHLORIDE CRYS ER 20 MEQ PO TBCR
40.0000 meq | EXTENDED_RELEASE_TABLET | Freq: Once | ORAL | Status: AC
  Administered 2018-01-11: 40 meq via ORAL
  Filled 2018-01-11: qty 2

## 2018-01-11 MED ORDER — MAGNESIUM SULFATE 4 GM/100ML IV SOLN
4.0000 g | Freq: Once | INTRAVENOUS | Status: AC
  Administered 2018-01-11: 4 g via INTRAVENOUS
  Filled 2018-01-11: qty 100

## 2018-01-11 MED ORDER — MIRTAZAPINE 15 MG PO TBDP
15.0000 mg | ORAL_TABLET | Freq: Every day | ORAL | Status: DC
  Administered 2018-01-11 – 2018-01-12 (×2): 15 mg via ORAL
  Filled 2018-01-11 (×2): qty 1

## 2018-01-11 NOTE — Progress Notes (Signed)
SATURATION QUALIFICATIONS: (This note is used to comply with regulatory documentation for home oxygen)  Patient Saturations on Room Air at Rest = 97%  Patient Saturations on Room Air while Ambulating = 94%  Patient Saturations on 0 Liters of oxygen while Ambulating = 0%  Please briefly explain why patient needs home oxygen: No need to use oxygen at this time

## 2018-01-11 NOTE — Progress Notes (Signed)
Daily Progress Note   Patient Name: Nicholas Hale       Date: 01/11/2018 DOB: 01-23-1946  Age: 72 y.o. MRN#: [id number] Attending Physician: Eugenie Filler, MD Primary Care Physician: [name], MD Admit Date: 01/05/2018  Reason for Consultation/Follow-up: Establishing goals of care, Non pain symptom management, Pain control and Psychosocial/spiritual support  Subjective: Patient is in bed and alert this morning. Wife at bedside.   We discussed that the continuation disease modifying therapy can be useful as long as he is able to enjoy his time at home, but there may come a time where further disease modifying therapy may not add further time and quality to his life.. We discussed hospice as a tool that may be beneficial in this goal when he reaches a point where we are trying to fix problems that are not fixable.  He will continue to rely on Dr. [name] to guide him in this decision making.  He is in agreement that a good plan would be to plan to complete radiation and follow-up with Dr. [name] as they have previously been arranging. He has done well with rehabbing in the past. If he does well at home and continues to thrive, I encouraged they continue with this plan. If, however he is unable to regain function and he continues to decline, I recommended that she speak with his care team to determine if he may be better served by focusing his care on staying at home with support of organization such as hospice.   Length of Stay: 6  Current Medications: Scheduled Meds:  . atorvastatin  20 mg Oral q1800  . budesonide (PULMICORT) nebulizer solution  0.5 mg Nebulization BID  . dextromethorphan-guaiFENesin  2 tablet Oral BID  . feeding supplement (ENSURE ENLIVE)  237 mL Oral BID BM    . heparin lock flush  250 Units Intracatheter Daily  . ipratropium  0.5 mg Nebulization TID  . levalbuterol  0.63 mg Nebulization TID  . mirtazapine  15 mg Oral QHS  . oxyCODONE-acetaminophen  1 tablet Oral Once  . rivaroxaban  20 mg Oral Q supper  . senna-docusate  1 tablet Oral QHS  . sodium chloride HYPERTONIC  4 mL Nebulization BID  . sucralfate  1 g Oral TID WC & HS    Continuous Infusions: .  piperacillin-tazobactam (ZOSYN)  IV 3.375 g (01/11/18 1007)    PRN Meds: acetaminophen **OR** acetaminophen, [DISCONTINUED] ipratropium **AND** albuterol, guaiFENesin-dextromethorphan, heparin lock flush **AND** heparin lock flush, HYDROcodone-homatropine, ketorolac, oxyCODONE, phenol, polyethylene glycol, prochlorperazine, sodium chloride, sodium chloride flush  Physical Exam  Constitutional: He appears well-developed. He is cooperative.  Pulmonary/Chest: Effort normal.  Cough at times   Musculoskeletal:       Right shoulder: He exhibits decreased strength.  Generalized weakness   Neurological: He is alert.  Awake and watching tv, engaging in conversation Resting in bed No edema Abdomen does not appear distended           Vital Signs: BP 128/78 (BP Location: Right Leg)   Pulse (!) 118   Temp 98.2 F (36.8 C) (Oral)   Resp 20   Ht 5\' 10"  (1.778 m)   Wt 82.2 kg (181 lb 3.5 oz)   SpO2 99%   BMI 26.00 kg/m  SpO2: SpO2: 99 % O2 Device: O2 Device: Nasal Cannula O2 Flow Rate: O2 Flow Rate (L/min): 1 L/min  Intake/output summary:   Intake/Output Summary (Last 24 hours) at 01/11/2018 1251 Last data filed at 01/11/2018 0535 Gross per 24 hour  Intake 360 ml  Output -  Net 360 ml   LBM: Last BM Date: 01/09/18 Baseline Weight: Weight: 82.2 kg (181 lb 3.5 oz) Most recent weight: Weight: 82.2 kg (181 lb 3.5 oz)       Palliative Assessment/Data: PPS 30%   Flowsheet Rows     Most Recent Value  Intake Tab  Referral Department  Hospitalist  Unit at Time of Referral   Oncology Unit  Palliative Care Primary Diagnosis  Cancer  Palliative Care Type  New Palliative care  Reason for referral  Clarify Goals of Care  Date first seen by Palliative Care  01/06/18  Clinical Assessment  Palliative Performance Scale Score  40%  Pain Max last 24 hours  4  Pain Min Last 24 hours  3  Dyspnea Max Last 24 Hours  6  Dyspnea Min Last 24 hours  4  Nausea Max Last 24 Hours  2  Nausea Min Last 24 Hours  1  Anxiety Max Last 24 Hours  5  Anxiety Min Last 24 Hours  4  Psychosocial & Spiritual Assessment  Palliative Care Outcomes  Patient/Family meeting held?  Yes  Who was at the meeting?  patient, wife, daughter.   Palliative Care Outcomes  Clarified goals of care      Patient Active Problem List   Diagnosis Date Noted  . Encounter for palliative care   . Hypokalemia   . Elevated TSH   . Postobstructive pneumonia 01/05/2018  . Sepsis due to pneumonia ([place]) 01/05/2018  . 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 Staphylococcus aureus bacteremia   . Bacteremia   . Malnutrition of moderate degree 12/17/2017  . Pneumonia 12/14/2017  . Leukocytosis 12/14/2017  . Anemia 12/14/2017  . Hyponatremia 12/14/2017  . HCAP (healthcare-associated pneumonia)   . Influenza vaccination administered at current visit 09/02/2017  . Gingival bleeding 08/21/2017  . Thrombocytopenia ([place]) 08/21/2017  . Dyspnea on exertion 04/05/2017  . Chemotherapy induced neutropenia ([place]) 03/31/2017  . Port catheter in place 03/03/2017  . Encounter for antineoplastic chemotherapy 02/17/2017  . Adenocarcinoma of left lung, stage 4 ([place]) 10/05/2016  . Goals of care, counseling/discussion 10/05/2016  . Chronic fatigue 10/05/2016  . Lung cancer ([place]) 09/25/2016  . Acute deep vein thrombosis (DVT) of proximal vein of both lower extremities (  HCC)   . Cardiac tamponade   . Elevated troponin 09/20/2016  . 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 Ventricular tachycardia ([place]) 09/19/2016  .  Renal cyst, right 09/19/2016  . Liver lesion 09/19/2016  . Nonsustained ventricular tachycardia ([place])   . History of tobacco abuse 01/28/2016  . Primary osteoarthritis of right hip 05/10/2015  . 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 Arthritis of left hip 05/23/2014  . Hypertension 05/15/2014  . Hyperlipidemia 05/15/2014    Palliative Care Assessment & Plan   Patient Profile: 72 year old gentleman with a past medical history significant for metastatic non-small cell lung cancer, adenocarcinoma, patient is under the care of Dr. Rogue Jury, DVT/PE (Xarelto), HTN, and AAA. He has undergone several chemotherapy and immunotherapy regimens as well as palliative radiation. Most recently, the patient was placed on systemic chemotherapy and is now hospitalized with ongoing weakness, postobstructive pneumonia, CT scan of the chest now showing progressive soft tissue mass in the left hilum and mediastinum with almost complete obstruction of left lower lobe bronchus and lingular bronchus.    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 Worsening generalized weakness, deconditioning.  Sore throat Difficulty swallowing larger pills  Recommendations/Plan:  Chloraseptic throat spray to bedside, use PRN  Remeron Q HS to help with mood, appetite and sleep.   Oxycodone PRN for pain control   Hycodan PRN for cough (may also use oxycodone if more effective)  Continue with PRN nebulizers for wheezing and shortness of breath   Continue with palliative radiation treatments per Dr. Sondra Come  May need out patient palliative care at home or at cancer center.   Continue supportive care  Antibiotics and further  hospital care as per my colleague Dr Grandville Silos.   Goals: Patient and family wish to continue current mode of care, remain hopeful for some degree of stabililization/recovery. Per Oncologist if improvement s/p radiation may consider systemic chemotherapy. After discharge, and after palliative radiation therapy is completed, should the patient have an ongoing increased symptom burden leading to an unacceptable further lowering of his quality of life, then, at that point, patient and family would like to instate hospice services at home. Continue with palliative care for now.    Goals of Care and Additional Recommendations:  Limitations on Scope of Treatment: DNR with Full Scope Treatment  Code Status:    Code Status Orders  (From admission, onward)        Start     Ordered   01/05/18 2008  Do not attempt resuscitation (DNR)  Continuous    Question Answer Comment  In the event of cardiac or respiratory ARREST Do not call a "code blue"   In the event of cardiac or respiratory ARREST Do not perform Intubation, CPR, defibrillation or ACLS   In the event of cardiac or respiratory ARREST Use medication by any route, position, wound care, and other measures to relive pain and suffering. May use oxygen, suction and manual treatment of airway obstruction as needed for comfort.   Comments Confirmed with patient and family 2/6      01/05/18 2008    Code Status History    Date Active Date Inactive Code Status Order ID Comments User Context   01/05/2018 17:24 01/05/2018 19:59 DNR [id number]  Patrecia Pour, MD Inpatient   12/15/2017 08:19 12/24/2017 17:37 Full Code [id number]  Jani Gravel, MD ED   08/21/2017 13:39 08/22/2017 17:20 Full Code [id number]  Truett Mainland, DO ED   02/01/2017 15:52 02/02/2017 03:10 Full Code [id number]  Arne Cleveland, MD [place]   09/19/2016 22:36 09/23/2016 16:45 Full Code [id number]  Ivor Costa, MD Inpatient   05/15/2015 17:53 05/17/2015 13:50 Full Code [id number]  Frederik Pear, MD  Inpatient   08/07/2014 10:00 08/08/2014 14:40 Full Code [id number]  Wilhelmina Mcardle, MD Inpatient   08/06/2014 14:51 08/07/2014 10:00 Full Code [id number]  Sandi Mariscal, MD Inpatient   08/05/2014 20:48 08/06/2014 14:51 Full Code [id number]  Marybelle Killings, MD Inpatient   08/05/2014 17:30 08/05/2014 20:48 Full Code [id number]  Doree Fudge, MD ED   05/23/2014 15:27 05/25/2014 17:18 Full Code [id number]  Kerin Salen, MD Inpatient    Advance Directive Documentation     Most Recent Value  Type of Advance Directive  Healthcare Power of Attorney, Living will  Pre-existing out of facility DNR order (yellow form or pink MOST form)  No data  "MOST" Form in Place?  No data      Prognosis:   Guarded potentially poor prognosis depending on course s/p radiation.  Discharge Planning:  Home with Home Health and possible outpatient palliative vs. Cancer center services  Care plan was discussed with patient, wife.  Thank you for allowing the Palliative Medicine Team to assist in the care of this patient.   Total Time 30 min Prolonged Time Billed  No      Greater than 50%  of this time was spent counseling and coordinating care related to the above assessment and plan.  Micheline Rough, MD [facility] Team [phone]  Please contact Palliative Medicine Team phone at [phone] for questions and concerns.

## 2018-01-11 NOTE — Care Management Important Message (Signed)
Important Message  Patient Details  Name: KRISTAIN HU MRN: [id number] Date of Birth: May 28, 1946   Medicare Important Message Given:  Yes    Kerin Salen 01/11/2018, 12:32 [place] Message  Patient Details  Name: CAREL CARRIER MRN: [id number] Date of Birth: 1946-07-05   Medicare Important Message Given:  Yes    Kerin Salen 01/11/2018, 12:32 PM

## 2018-01-11 NOTE — Evaluation (Signed)
Occupational Therapy Evaluation Patient Details Name: Nicholas Hale MRN: [id number] DOB: Apr 30, 1946 Today's Date: 01/11/2018    History of Present Illness 72 yo male admitted with postobstructive pneumonia, sepsis, tumor progression. Hx of bil THR, cervical fusion, lung ca-on chemo/radiation, MSSA bacteremia   Clinical Impression   Pt admitted as above currently demonstrating decreased activity tolerance, decreased conditioning impacting  his ability to perform ADL's, functional mobility/transfers (See OT problem list below). He should benefit from acute OT to assist in maximizing independence with ADL's etc for anticipated d/c home with spouse PRN Assist. Will follow acutely.    Follow Up Recommendations  Home health OT;Supervision/Assistance - 24 hour;Supervision - Intermittent    Equipment Recommendations  None recommended by OT    Recommendations for Other Services       Precautions / Restrictions Precautions Precautions: Fall Restrictions Weight Bearing Restrictions: No      Mobility Bed Mobility Overal bed mobility: (Pt sitting up at EOB upon OT arrival)                Transfers Overall transfer level: Needs assistance Equipment used: Rolling walker (2 wheeled) Transfers: Sit to/from [facility] Sit to Stand: Min guard Stand pivot transfers: Min guard       General transfer comment: close guard for safety. VCs hand placement, positioning of RW for safety during ADL's    Balance Overall balance assessment: Needs assistance Sitting-balance support: Feet supported;Single extremity supported;No upper extremity supported Sitting balance-Leahy Scale: Good     Standing balance support: Bilateral upper extremity supported Standing balance-Leahy Scale: Poor(Fair-poor fatigues easily) Standing balance comment: Brief standing at sink to wash hands after toileting.                           ADL either performed or assessed with  clinical judgement   ADL Overall ADL's : Needs assistance/impaired     Grooming: Wash/dry hands;Standing;Min guard;Cueing for safety(VC's for RW placement during ADL's for safety) Grooming Details (indicate cue type and reason): Pt would be set-up Mod I Assist in sitting Upper Body Bathing: Modified independent;Sitting;Set up Upper Body Bathing Details (indicate cue type and reason): Increased time, pt fatigues easily Lower Body Bathing: Sit to/from stand;Minimal assistance   Upper Body Dressing : Set up;Sitting;Modified independent   Lower Body Dressing: Minimal assistance;Sit to/from stand;With caregiver independent assisting Lower Body Dressing Details (indicate cue type and reason): Pt spouse assisted with slippers, states that she assists PRN for LB dressing and bathing Toilet Transfer: Min guard;Ambulation;RW;Cueing for safety;Cueing for sequencing Toilet Transfer Details (indicate cue type and reason): Cues for RW use/safety and placement during ADL's Toileting- Clothing Manipulation and Hygiene: Min guard;Sit to/from stand   Tub/ Banker: Copy Details (indicate cue type and reason): Has small shower at home with seat, hand held shower head, discussed transfers verbally as it has a door and angled shaped door. Functional mobility during ADLs: Min guard;Cueing for safety;Cueing for sequencing;Rolling walker;Caregiver able to provide necessary level of assistance General ADL Comments: Pt was assessed for OT followed by participation in ADL retraining session with focus on toilet transfer, toileting, recommandations for shower transfers and assist PRN for LB ADL's. Also discussed getting into/out of house as son-in-law is adapting side, deck door entrance with 2 smaller steps vs front door 8 STE.     Vision Baseline Vision/History: Wears glasses Patient Visual Report: No change from baseline       Perception  Praxis      Pertinent  Vitals/Pain Pain Assessment: 0-10 Pain Score: 8  Faces Pain Scale: Hurts whole lot Pain Location: Stomach from coughing Pain Descriptors / Indicators: Sore;Aching Pain Intervention(s): Limited activity within patient's tolerance;Monitored during session;Patient requesting pain meds-RN notified     Hand Dominance Right   Extremity/Trunk Assessment Upper Extremity Assessment Upper Extremity Assessment: Generalized weakness   Lower Extremity Assessment Lower Extremity Assessment: Defer to PT evaluation   Cervical / Trunk Assessment Cervical / Trunk Assessment: Kyphotic   Communication Communication Communication: No difficulties   Cognition Arousal/Alertness: Awake/alert Behavior During Therapy: WFL for tasks assessed/performed Overall Cognitive Status: Within Functional Limits for tasks assessed                                     General Comments       Exercises     Shoulder Instructions      Home Living Family/patient expects to be discharged to:: Private residence Living Arrangements: Spouse/significant other Available Help at Discharge: Family Type of Home: House Home Access: Stairs to enter [employer] of Steps: 8 - may be able to take 3 steps in to house from deck Entrance Stairs-Rails: Right;Left Home Layout: Able to live on main level with bedroom/bathroom     Bathroom Shower/Tub: Hospital doctor Toilet: Handicapped height     Home Equipment: Environmental consultant - 2 wheels;Bedside commode          Prior Functioning/Environment          Comments: using RW since last admission        OT Problem List: Decreased strength;Pain;Cardiopulmonary status limiting activity;Decreased activity tolerance;Impaired balance (sitting and/or standing)      OT Treatment/Interventions: Self-care/ADL training;DME and/or AE instruction;Therapeutic activities;Energy conservation;Patient/family education    OT Goals(Current goals can be  found in the care plan section) Acute Rehab OT Goals Patient Stated Goal: Increased independence OT Goal Formulation: With patient/family Time For Goal Achievement: 01/25/18 Potential to Achieve Goals: Fair  OT Frequency: Min 2X/week   Barriers to D/C:            Co-evaluation              AM-PAC PT "6 Clicks" Daily Activity     Outcome Measure Help from another person eating meals?: None Help from another person taking care of personal grooming?: A Little Help from another person toileting, which includes using toliet, bedpan, or urinal?: A Little Help from another person bathing (including washing, rinsing, drying)?: A Little Help from another person to put on and taking off regular upper body clothing?: None Help from another person to put on and taking off regular lower body clothing?: A Little 6 Click Score: 20   End of Session Equipment Utilized During Treatment: Gait belt;Rolling walker;Oxygen Nurse Communication: Patient requests pain meds(RN was giving meds at conclusion of therapy )  Activity Tolerance: Patient limited by fatigue;Patient tolerated treatment well Patient left: in bed(Sitting up at EOB, RN and spouse in room)  OT Visit Diagnosis: Pain;Other abnormalities of gait and mobility (R26.89);Muscle weakness (generalized) (M62.81) Pain - part of body: (Stomach from coughing per pt report)                Time: 2956-2130 OT Time Calculation (min): 23 min Charges:  OT General Charges $OT Visit: 1 Visit OT Evaluation $OT Eval Moderate Complexity: 1 Mod OT Treatments $Self Care/Home Management :  8-22 mins G-Codes:      Almyra Deforest, OTR/L 01/11/2018, 9:13 AM

## 2018-01-11 NOTE — Progress Notes (Signed)
Phoned Danielle, RN on [facility] for this patient. I informed her nurse Elmo Putt switched his IV to [facility] normal saline when the Zosyn completed. She verbalized understanding.

## 2018-01-11 NOTE — Progress Notes (Signed)
[facility] Therapy Treatment Record Phone [phone]   Radiation Therapy was administered to Nicholas Hale on: 01/11/2018  1:17 PM and was treatment # 3 out of a planned course of 8 treatments.  Radiation Treatment  1). Beam photons with 6-10 energy  2). Brachytherapy None  3). Stereotactic Radiosurgery None  4). Other Radiation None     Bennett Scrape, RT (T)

## 2018-01-11 NOTE — Progress Notes (Signed)
PROGRESS NOTE    Nicholas Hale  QAS:[id number] DOB: 1946/11/14 DOA: 01/05/2018 PCP: [name], MD    Brief Narrative:   Nicholas Hale is a 72 y.o. male with a history of DVT/PE on xarelto and stage IV NSCLC with recent admission for pneumonia and MSSA bacteremia discharged 1/25 on ancef thru PICC through 2/2. After initial improvement he noted constant, progressive, now severe dyspnea associated with increasing weakness and nonproductive cough with pleuritic chest pain. No fevers. He fell due to weakness getting into bed this morning prompting return to ED where he was ill-appearing, dyspneic and placed on oxygen. CXR suggested progressive LLL opacity, confirmed by CTA chest to be progressive airspace disease beyond narrowed LLL and lingular bronchi narrowed by hilar mass. Blood cultures drawn, vancomycin and zosyn were started, and hospitalists called to admit for postobstructive pneumonia. The patient's oncologist, Dr. [name] was called for consult.     Assessment & Plan:   Principal Problem:   Postobstructive pneumonia Active Problems:   Adenocarcinoma of left lung, stage 4 (HCC)   Thrombocytopenia (HCC)   Anemia   Sepsis due to pneumonia ([place])   Hypokalemia   Elevated TSH   Encounter for palliative care  1 sepsis secondary to postobstructive left lower lobe pneumonia Patient on admission met criteria for sepsis secondary to tachycardia, tachypnea, leukocytosis, CT scan concerning for postobstructive pneumonia with progression of lung disease in the left hilar region resulting in near complete left lower lung obstruction/collapse.  Patient currently afebrile.  MRSA PCR negative.  Blood cultures negative to date.  Sputum Gram stain and cultures pending.  This patient on scheduled Pulmicort, scheduled Xopenex and Atrovent nebs as well as as needed nebs.  Continue Mucinex.  Continue empiric IV Zosyn D7/10.  Discontinued IV vancomycin.  Will likely transition to oral  antibiotics in the next few days to complete a course of antibiotic treatment.  Pro-calcitonin level is elevated and as such we will continue IV antibiotics for now.  Follow pro calcitonin levels and leukocytosis. Once 10-day course of antibiotics has been completed and patient stable for discharge patient may be discharged home with a prescription for 10-day course of Augmentin to be used as needed while receiving ongoing radiation if patient does develop fever, worsening cough and resumption of respiratory symptoms.  Discussed with ID.  Patient has been seen in consultation by pulmonary, Dr. [name] who feels no role for bronchoscopy at this time and recommending continued IV antibiotics, palliative care consultation, consultation with oncology and rad/onc for palliative radiation.  Patient has been seen by radiation oncology who recommended some palliative radiation started on 01/07/2018.  Patient has also been seen by his oncologist Dr. [name] who is in agreement with retreatment with radiation if possible and if there is improvement in patient's condition may consider resuming systemic chemotherapy.  Appreciate pulmonary's input and recommendations.  Oncology and rad/oncology following.  2.  Stage IV non-small cell lung cancer: Status post XRT completed November 2018 and last chemotherapy December 2018. Patient with history of malignant pericardial effusion.  No effusion noted at this time.  Patient with progression of disease leading to postobstructive pneumonia.  Patient has been seen by radiation oncology and patient has been started on palliative radiation.  Patient has also been seen by his oncologist, Dr. [name] who is in agreement with retreatment with radiation if possible and if there is improvement in patient's condition may consider resuming his systemic chemotherapy.  Palliative care has assessed the patient,  family at this time wanting to proceed with palliative radiation first  prior to deciding as to whether to shift focus to full comfort.  Patient currently receiving palliative radiation. Palliative care met with family again 01/09/2018.  Will need outpatient follow-up with oncology.  3.  History of DVT/PE CT angiogram chest negative for any acute PE.  No bleeding noted.  Continue Xarelto for anticoagulation.   4.  Thrombocytopenia No bleeding noted.  Platelet count slowly trending down.  Follow.    5.  Anemia of chronic disease Hemoglobin stable at 9.0.  Transfusion threshold hemoglobin less than 7.    6.  Elevated TSH During recent admission TSH was 17 with a normal free T4.  Patient will need repeat thyroid function studies done in about 4-6 weeks in the outpatient setting with PCP.  7.  Hypocalcemia Patient given a dose of calcium gluconate on admission.  Follow.  8.  Hypokalemia/hypomagnesemia Replete.  Keep magnesium greater than 2.  Follow.    DVT prophylaxis: xarelto Code Status: DNR Family Communication: updated patient and wife at bedside. Disposition Plan: To be determined.  Likely back home with home health.   Consultants:   Pulmonary: Dr. [name] 01/06/2018  Oncology: Dr. [name] 01/06/2018  Radiation oncology  Palliative care: Dr.Anwar 01/07/2018  Procedures:   CT angiogram chest 01/05/2018  Chest x-ray 01/05/2018    Antimicrobials:   IV Zosyn 01/05/2018  IV vancomycin 01/05/2018>>>>> 01/07/2018   Subjective: Patient in bed.  Feels congestion is thinning out after being started on hypertonic saline nebs.  Denies any chest pain.  Still feeling congested.   Objective: Vitals:   01/10/18 2100 01/10/18 2302 01/11/18 0535 01/11/18 0742  BP: 138/81  128/78   Pulse: (!) 117  (!) 118   Resp: (!) 21  20   Temp: 98.7 F (37.1 C)  98.2 F (36.8 C)   TempSrc: Oral  Oral   SpO2: 98% 97% 99% 99%  Weight:      Height:        Intake/Output Summary (Last 24 hours) at 01/11/2018 1104 Last data filed at 01/11/2018 0535 Gross per 24  hour  Intake 360 ml  Output -  Net 360 ml   Filed Weights   01/05/18 1713  Weight: 82.2 kg (181 lb 3.5 oz)    Examination:  General exam: Pale.   Respiratory system: Scattered rhonchi.  Some coarse breath sounds.  Congested, no wheezing.   Cardiovascular system: RRR.  No M/R/G.  No lower extremity edema.  No JVD.  Gastrointestinal system: Abdomen is nontender, nondistended, soft, positive bowel sounds.  No hepatosplenomegaly.    Central nervous system: Alert and oriented. No focal neurological deficits. Extremities: Symmetric 5 x 5 power. Skin: No rashes, lesions or ulcers Psychiatry: Judgement and insight appear normal. Mood & affect appropriate.     Data Reviewed: I have personally reviewed following labs and imaging studies  CBC: Recent Labs  Lab 01/05/18 1026  01/06/18 0904 01/07/18 0634 01/08/18 0632 01/09/18 0558 01/10/18 0833 01/11/18 0329  WBC 20.7*  --  15.1* 15.3* 13.6* 15.1* 14.4* 12.3*  NEUTROABS 17.9*  --  13.5* 13.3*  --   --  12.4* 10.5*  HGB 10.8*   < > 9.3* 9.0* 9.3* 9.3* 9.2* 9.0*  HCT 34.1*   < > 30.4* 28.9* 29.6* 28.7* 29.5* 28.7*  MCV 102.4*  --  104.1* 102.5* 101.7* 100.3* 103.5* 104.0*  PLT 126*  --  121* 123* 124* 131* 116* 108*   < > =  values in this interval not displayed.   Basic Metabolic Panel: Recent Labs  Lab 01/06/18 0904 01/07/18 0634 01/08/18 0511 01/09/18 0558 01/10/18 0806 01/11/18 0329  NA 136 135 138 136 140 139  K 3.3* 3.4* 3.6 3.5 3.5 3.3*  CL 106 104 107 104 105 104  CO2 _0 GLUCOSE 208* 163* 124* 116* 106* 128*  BUN _1 CREATININE 0.88 0.90 0.85 0.88 0.89 1.06  CALCIUM 8.0* 8.0* 8.1* 8.1* 8.1* 8.0*  MG 1.5* 1.9  --  1.6* 2.0 1.7   GFR: Estimated Creatinine Clearance: 66 mL/min (by C-G formula based on SCr of 1.06 mg/dL). Liver Function Tests: Recent Labs  Lab 01/05/18 1026  AST 63*  ALT 19  ALKPHOS 89  BILITOT 0.5  PROT 6.7  ALBUMIN 2.1*   No results for input(s): LIPASE,  AMYLASE in the last 168 hours. No results for input(s): AMMONIA in the last 168 hours. Coagulation Profile: No results for input(s): INR, PROTIME in the last 168 hours. Cardiac Enzymes: No results for input(s): CKTOTAL, CKMB, CKMBINDEX, TROPONINI in the last 168 hours. BNP (last 3 results) No results for input(s): PROBNP in the last 8760 hours. HbA1C: No results for input(s): HGBA1C in the last 72 hours. CBG: No results for input(s): GLUCAP in the last 168 hours. Lipid Profile: No results for input(s): CHOL, HDL, LDLCALC, TRIG, CHOLHDL, LDLDIRECT in the last 72 hours. Thyroid Function Tests: No results for input(s): TSH, T4TOTAL, FREET4, T3FREE, THYROIDAB in the last 72 hours. Anemia Panel: No results for input(s): VITAMINB12, FOLATE, FERRITIN, TIBC, IRON, RETICCTPCT in the last 72 hours. Sepsis Labs: Recent Labs  Lab 01/05/18 1046 01/05/18 1525 01/10/18 1459 01/11/18 0329  PROCALCITON  --   --  41.29 45.89  LATICACIDVEN 1.09 0.99  --   --     Recent Results (from the past 240 hour(s))  Blood Culture (routine x 2)     Status: None   Collection Time: 01/05/18 10:26 AM  Result Value Ref Range Status   Specimen Description   Final    BLOOD LEFT ANTECUBITAL Performed at [facility] [address], [place], [place] [address]    Special Requests   Final    BOTTLES DRAWN AEROBIC AND ANAEROBIC Blood Culture adequate volume Performed at [facility] [address], [place], [place] [address]    Culture   Final    NO GROWTH 5 DAYS Performed at [facility], [place] [address] [place], [place] [address]    Report Status 01/10/2018 FINAL  Final  Blood Culture (routine x 2)     Status: None   Collection Time: 01/05/18 10:30 AM  Result Value Ref Range Status   Specimen Description   Final    BLOOD LEFT WRIST Performed at [facility] [address], [place], [place] [address]    Special Requests   Final    BOTTLES  DRAWN AEROBIC AND ANAEROBIC Blood Culture adequate volume Performed at [facility] [address] [place], [place] [address]    Culture   Final    NO GROWTH 5 DAYS Performed at [facility], [place] [address], [place], [place] [address]    Report Status 01/10/2018 FINAL  Final  MRSA PCR Screening     Status: None   Collection Time: 01/06/18 12:08 PM  Result Value Ref Range Status   MRSA by PCR NEGATIVE NEGATIVE Final  Comment:        The GeneXpert MRSA Assay (FDA approved for NASAL specimens only), is one component of a comprehensive MRSA colonization surveillance program. It is not intended to diagnose MRSA infection nor to guide or monitor treatment for MRSA infections. Performed at [facility], [place] [address] [place], [place] [address]          Radiology Studies: No results found.      Scheduled Meds: . atorvastatin  20 mg Oral q1800  . budesonide (PULMICORT) nebulizer solution  0.5 mg Nebulization BID  . dextromethorphan-guaiFENesin  2 tablet Oral BID  . feeding supplement (ENSURE ENLIVE)  237 mL Oral BID BM  . heparin lock flush  250 Units Intracatheter Daily  . ipratropium  0.5 mg Nebulization TID  . levalbuterol  0.63 mg Nebulization TID  . mirtazapine  15 mg Oral QHS  . oxyCODONE-acetaminophen  1 tablet Oral Once  . rivaroxaban  20 mg Oral Q supper  . senna-docusate  1 tablet Oral QHS  . sodium chloride HYPERTONIC  4 mL Nebulization BID  . sucralfate  1 g Oral TID WC & HS   Continuous Infusions: . magnesium sulfate 1 - 4 g bolus IVPB 4 g (01/11/18 1007)  . piperacillin-tazobactam (ZOSYN)  IV 3.375 g (01/11/18 1007)     LOS: 6 days    Time spent: 59 mins    Irine Seal, MD [facility] Pager [phone]  If 7PM-7AM, please contact night-coverage [email or web address] Password [facility] 01/11/2018, 11:04 AM

## 2018-01-12 ENCOUNTER — Ambulatory Visit: Payer: Medicare Other | Admitting: Nurse Practitioner

## 2018-01-12 ENCOUNTER — Telehealth: Payer: Self-pay | Admitting: Medical Oncology

## 2018-01-12 ENCOUNTER — Other Ambulatory Visit: Payer: Medicare Other

## 2018-01-12 ENCOUNTER — Ambulatory Visit
Admit: 2018-01-12 | Discharge: 2018-01-12 | Disposition: A | Discharge status: Home / Self Care | Payer: Medicare Other | Attending: Radiation Oncology | Admitting: Radiation Oncology

## 2018-01-12 ENCOUNTER — Ambulatory Visit: Payer: Medicare Other

## 2018-01-12 DIAGNOSIS — D649 Anemia, unspecified: Secondary | ICD-10-CM

## 2018-01-12 DIAGNOSIS — G4700 Insomnia, unspecified: Secondary | ICD-10-CM

## 2018-01-12 LAB — CBC WITH DIFFERENTIAL/PLATELET
BASOS PCT: 0 %
Basophils Absolute: 0 10*3/uL (ref 0.0–0.1)
EOS ABS: 0 10*3/uL (ref 0.0–0.7)
Eosinophils Relative: 0 %
HCT: 28.8 % — ABNORMAL LOW (ref 39.0–52.0)
HEMOGLOBIN: 9.1 g/dL — AB (ref 13.0–17.0)
Lymphocytes Relative: 6 %
Lymphs Abs: 0.8 10*3/uL (ref 0.7–4.0)
MCH: 32.7 pg (ref 26.0–34.0)
MCHC: 31.6 g/dL (ref 30.0–36.0)
MCV: 103.6 fL — ABNORMAL HIGH (ref 78.0–100.0)
MONO ABS: 1 10*3/uL (ref 0.1–1.0)
MONOS PCT: 8 %
NEUTROS PCT: 86 %
Neutro Abs: 9.9 10*3/uL — ABNORMAL HIGH (ref 1.7–7.7)
Platelets: 111 10*3/uL — ABNORMAL LOW (ref 150–400)
RBC: 2.78 MIL/uL — ABNORMAL LOW (ref 4.22–5.81)
RDW: 18.9 % — ABNORMAL HIGH (ref 11.5–15.5)
WBC: 11.7 10*3/uL — ABNORMAL HIGH (ref 4.0–10.5)

## 2018-01-12 LAB — BASIC METABOLIC PANEL
Anion gap: 9 (ref 5–15)
BUN: 15 mg/dL (ref 6–20)
CHLORIDE: 104 mmol/L (ref 101–111)
CO2: 24 mmol/L (ref 22–32)
CREATININE: 0.94 mg/dL (ref 0.61–1.24)
Calcium: 7.9 mg/dL — ABNORMAL LOW (ref 8.9–10.3)
GFR calc Af Amer: >60 mL/min (ref >60)
GFR calc non Af Amer: >60 mL/min (ref >60)
Glucose, Bld: 118 mg/dL — ABNORMAL HIGH (ref 65–99)
Potassium: 3.6 mmol/L (ref 3.5–5.1)
SODIUM: 137 mmol/L (ref 135–145)

## 2018-01-12 LAB — PROCALCITONIN: PROCALCITONIN: 45.21 ng/mL

## 2018-01-12 LAB — MAGNESIUM: Magnesium: 2.1 mg/dL (ref 1.7–2.4)

## 2018-01-12 MED ORDER — SODIUM CHLORIDE 0.9 % IV SOLN
3.0000 g | Freq: Four times a day (QID) | INTRAVENOUS | Status: DC
  Administered 2018-01-12 – 2018-01-13 (×4): 3 g via INTRAVENOUS
  Filled 2018-01-12 (×5): qty 3

## 2018-01-12 NOTE — Telephone Encounter (Signed)
Appointments cancelled per [facility]. Wife confirmed next appt with [facility] 2/29.

## 2018-01-12 NOTE — Progress Notes (Signed)
Occupational Therapy Treatment Patient Details Name: Nicholas Hale MRN: [id number] DOB: [birth year] Today's Date: 01/12/2018    History of present illness 72 yo male admitted with postobstructive pneumonia, sepsis, tumor progression. Hx of bil THR, cervical fusion, lung ca-on chemo/radiation, MSSA bacteremia   OT comments  Pt got up to chair and reviewed energy conservation with wife, while he rested then with him.  He did not feel up to any further activity this pm.  Pt participated with PT earlier and also had XRT  Follow Up Recommendations  Home health OT;Supervision/Assistance - 24 hour;Supervision - Intermittent    Equipment Recommendations  None recommended by OT    Recommendations for Other Services      Precautions / Restrictions Precautions Precautions: Fall Restrictions Weight Bearing Restrictions: No       Mobility Bed Mobility                  Transfers   Equipment used: Rolling walker (2 wheeled)   Sit to Stand: Min guard Stand pivot transfers: Min guard       General transfer comment: for safety; cues for UE placement    Balance                                           ADL either performed or assessed with clinical judgement   ADL                           Toilet Transfer: Min guard;Stand-pivot;RW(chair)             General ADL Comments: pt fatiqued and only wanted to get up to chair this pm.  He worked with PT earlier today.  Reviewed with wife then pt, energy conservation strategies and gave them handouts.  Recommended that pt initially sponge bathe due to decreased endurance.  HHOT can go over shower transfers when he is ready.  Pt's wife has a small seat she put in front of sink for him.  Noted that pt did initiate a rest break prior to scooting back into chair     Vision       Perception     Praxis      Cognition Arousal/Alertness: Awake/alert Behavior During Therapy: WFL for tasks  assessed/performed Overall Cognitive Status: Within Functional Limits for tasks assessed                                          Exercises     Shoulder Instructions       General Comments      Pertinent Vitals/ Pain       Pain Assessment: No/denies pain  Home Living                                          Prior Functioning/Environment              Frequency           Progress Toward Goals  OT Goals(current goals can now be found in the care plan section)  Progress towards OT goals: Progressing toward goals     Plan  Co-evaluation                 AM-PAC PT "6 Clicks" Daily Activity     Outcome Measure   Help from another person eating meals?: None Help from another person taking care of personal grooming?: A Little Help from another person toileting, which includes using toliet, bedpan, or urinal?: A Little Help from another person bathing (including washing, rinsing, drying)?: A Little Help from another person to put on and taking off regular upper body clothing?: A Little Help from another person to put on and taking off regular lower body clothing?: A Little 6 Click Score: 19    End of Session    OT Visit Diagnosis: Muscle weakness (generalized) (M62.81)   Activity Tolerance Patient limited by fatigue   Patient Left in chair;with call bell/phone within reach;with family/visitor present   Nurse Communication (no chair alarm in chair:  OK per RN)        Time: 6387-5643 (3295-1884 and 1330-1340) OT Time Calculation (min): 15 min  Charges: OT General Charges $OT Visit: 1 Visit OT Treatments $Therapeutic Activity: 8-22 mins  Lesle Chris, OTR/L [phone] 01/12/2018   [place] 01/12/2018, 2:42 PM

## 2018-01-12 NOTE — Progress Notes (Addendum)
Patient ambulated in the hall. Patient complained that he felt his heart was racing and needed to sit down. Heart rate checked at that time was 122. Patient wheeled back to bed. HR 98 once resting. MD paged. No new orders at this time. Will continue to monitor.

## 2018-01-12 NOTE — Progress Notes (Signed)
[facility] Therapy Treatment Record Phone [phone]   Radiation Therapy was administered to [name] on: 01/12/2018  11:26 AM and was treatment 4 out of a planned course of 8 treatments.  Radiation Treatment  1). Beam photons with 6-10 energy  2). Brachytherapy None  3). Stereotactic Radiosurgery None  4). Other Radiation None     Micheline Chapman, RT (T)

## 2018-01-12 NOTE — Progress Notes (Signed)
Physical Therapy Treatment Patient Details Name: Nicholas Hale MRN: [id number] DOB: Aug 16, 1946 Today's Date: 01/12/2018    History of Present Illness 72 yo male admitted with postobstructive pneumonia, sepsis, tumor progression. Hx of bil THR, cervical fusion, lung ca-on chemo/radiation, MSSA bacteremia    PT Comments    Session focused on ambulation and stair negotiation per pt request. Wife requested wheelchair practice as well however we did not have enough time to address w/c mobility due to MD rounding and pt's radiation appt at 11. Min guard assist for ambulation and Min assist for stair negotiation.Discussed having son (n law?) be present to assist when pt needs to negotiate stairs for home entry/exit, for safety, until wife feels comfortable. Plan, at this time, is for pt to return home. Discussed using wheelchair to get pt up walkway to stairs to conserve some energy. Will continue to follow and progress activity as tolerated.     Follow Up Recommendations  Home health PT;Supervision/Assistance - 24 hour     Equipment Recommendations  None recommended by PT(wife stated they now have a wheelchair available as well)    Recommendations for Other Services       Precautions / Restrictions Precautions Precautions: Fall Restrictions Weight Bearing Restrictions: No    Mobility  Bed Mobility   Bed Mobility: Supine to Sit   Supine to sit: Modified independent (Device/Increase time)       General bed mobility comments: no assist given. increased time  Transfers Overall transfer level: Needs assistance Equipment used: Rolling walker (2 wheeled) Transfers: Sit to/from Stand Sit to Stand: Min guard         General transfer comment: close guard for safety. VCs hand placement, positioning of RW for safety during ADL's. Multiple attempts from low surface  Ambulation/Gait Ambulation/Gait assistance: Min guard Ambulation Distance (Feet): 60 Feet Assistive device: Rolling  walker (2 wheeled) Gait Pattern/deviations: Step-through pattern;Decreased stride length     General Gait Details: close guard for safety. slow gait speed.    Stairs Stairs: Yes  Min Assist Stair Management: Two rails;One rail Right;Step to pattern;Sideways Number of Stairs: 3 General stair comments: Practiced x 1 using 2 rails while ascending. Practiced x 1 using 2 hands on 1 rail while descending. Increased time and effort. Min assist to stabilize and for safety.   Wheelchair Mobility    Modified Rankin (Stroke Patients Only)       Balance Overall balance assessment: Needs assistance           Standing balance-Leahy Scale: Poor                              Cognition Arousal/Alertness: Awake/alert Behavior During Therapy: WFL for tasks assessed/performed Overall Cognitive Status: Within Functional Limits for tasks assessed                                        Exercises      General Comments        Pertinent Vitals/Pain Pain Assessment: Faces Faces Pain Scale: Hurts even more Pain Location: back, abdomen, hips Pain Descriptors / Indicators: Sore Pain Intervention(s): Monitored during session;Repositioned    Home Living                      Prior Function  PT Goals (current goals can now be found in the care plan section) Progress towards PT goals: Progressing toward goals    Frequency    Min 3X/week      PT Plan Current plan remains appropriate    Co-evaluation              AM-PAC PT "6 Clicks" Daily Activity  Outcome Measure  Difficulty turning over in bed (including adjusting bedclothes, sheets and blankets)?: A Little Difficulty moving from lying on back to sitting on the side of the bed? : A Little Difficulty sitting down on and standing up from a chair with arms (e.g., wheelchair, bedside commode, etc,.)?: A Little Help needed moving to and from a bed to chair (including a  wheelchair)?: A Little Help needed walking in hospital room?: A Little Help needed climbing 3-5 steps with a railing? : A Little 6 Click Score: 18    End of Session Equipment Utilized During Treatment: Gait belt Activity Tolerance: Patient tolerated treatment well Patient left: in bed;with call bell/phone within reach;with family/visitor present   PT Visit Diagnosis: Muscle weakness (generalized) (M62.81);Difficulty in walking, not elsewhere classified (R26.2)     Time: 5056-9794 PT Time Calculation (min) (ACUTE ONLY): 29 min  Charges:  $Gait Training: 23-37 mins                    G Codes:          Weston Anna, MPT Pager: [phone]

## 2018-01-12 NOTE — Progress Notes (Signed)
PROGRESS NOTE        PATIENT DETAILS Name: Nicholas Hale Age: 72 y.o. Sex: male Date of Birth: 11/30/1946 Admit Date: 01/05/2018 Admitting Physician Patrecia Pour, MD ION:[id number], [name], MD  Brief Narrative: Patient is a 72 y.o. 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  Slowly improving with antimicrobial therapy.  Subjective: Cough continues-main issue seems to be weakness.  Breathing is somewhat stable.  Assessment/Plan: Sepsis secondary to postobstructive pneumonia: Sepsis pathophysiology has resolved-remains on empiric antimicrobial therapy.  All culture data continue to be negative.  Continue supportive care with bronchodilators, Mucinex, flutter valve.  Seen by pulmonology earlier during this hospital stay-not a candidate for endoscopy.  Plans are to observe for 1 more day-and if he continues to do well-home on 2/14.    Stage IV non-small cell lung cancer: Followed by Dr. Mohammed-completed chemotherapy 2018.  Seen by oncology during this hospital stay-with tentative plans to restart palliative radiation.  History of venous thromboembolic: Continue Xarelto-recent CT angiogram of the chest was negative for pulmonary embolism  Anemia: Appears to be mild-probably secondary to chronic disease-stable for outpatient workup  Thrombocytopenia: Appears to be mild-no overt bleeding-plans are to follow  Subclinical hypothyroidism: Repeat thyroid function test in the next few months.  DVT Prophylaxis: Full dose anticoagulation with Xarelto  Code Status:  DNR  Family Communication: Spouse at bedside  Disposition Plan: Remain inpatient-tentative home on 2/14  Antimicrobial agents: Anti-infectives (From admission,  onward)   Start     Dose/Rate Route Frequency Ordered Stop   01/12/18 1000  Ampicillin-Sulbactam (UNASYN) 3 g in sodium chloride 0.9 % 100 mL IVPB     3 g 200 mL/hr over 30 Minutes Intravenous Every 6 hours 01/12/18 0959     01/05/18 1800  piperacillin-tazobactam (ZOSYN) IVPB 3.375 g  Status:  Discontinued     3.375 g 12.5 mL/hr over 240 Minutes Intravenous Every 8 hours 01/05/18 1728 01/12/18 0959   01/05/18 1800  vancomycin (VANCOCIN) IVPB 1000 mg/200 mL premix  Status:  Discontinued     1,000 mg 200 mL/hr over 60 Minutes Intravenous Every 12 hours 01/05/18 1728 01/07/18 1130   01/05/18 1045  vancomycin (VANCOCIN) IVPB 1000 mg/200 mL premix     1,000 mg 200 mL/hr over 60 Minutes Intravenous  Once 01/05/18 1031 01/05/18 1305   01/05/18 1045  piperacillin-tazobactam (ZOSYN) IVPB 3.375 g     3.375 g 100 mL/hr over 30 Minutes Intravenous  Once 01/05/18 1031 01/05/18 1105      Procedures: None  CONSULTS:  pulmonary/intensive care, hematology/oncology and Palliative care and radiation oncology  Time spent: 25- minutes-Greater than 50% of this time was spent in counseling, explanation of diagnosis, planning of further management, and coordination of care.  MEDICATIONS: Scheduled Meds: . atorvastatin  20 mg Oral q1800  . budesonide (PULMICORT) nebulizer solution  0.5 mg Nebulization BID  . dextromethorphan-guaiFENesin  2 tablet Oral BID  . feeding supplement (ENSURE ENLIVE)  237 mL Oral BID BM  . heparin lock flush  250 Units Intracatheter Daily  . ipratropium  0.5 mg Nebulization TID  . levalbuterol  0.63 mg Nebulization TID  . mirtazapine  15 mg Oral QHS  .  oxyCODONE-acetaminophen  1 tablet Oral Once  . rivaroxaban  20 mg Oral Q supper  . senna-docusate  1 tablet Oral QHS  . sodium chloride HYPERTONIC  4 mL Nebulization BID  . sucralfate  1 g Oral TID WC & HS   Continuous Infusions: . ampicillin-sulbactam (UNASYN) IV Stopped (01/12/18 1345)   PRN Meds:.acetaminophen **OR**  acetaminophen, [DISCONTINUED] ipratropium **AND** albuterol, guaiFENesin-dextromethorphan, heparin lock flush **AND** heparin lock flush, HYDROcodone-homatropine, ketorolac, oxyCODONE, phenol, polyethylene glycol, prochlorperazine, sodium chloride, sodium chloride flush   PHYSICAL EXAM: Vital signs: Vitals:   01/11/18 2132 01/12/18 0557 01/12/18 0754 01/12/18 1355  BP:  126/82  129/77  Pulse:  (!) 113  (!) 104  Resp:  20  18  Temp:  98.1 F (36.7 C)  98.4 F (36.9 C)  TempSrc:  Oral  Oral  SpO2: 95% 100% 97% 100%  Weight:      Height:       Filed Weights   01/05/18 1713  Weight: 82.2 kg (181 lb 3.5 oz)   Body mass index is 26 kg/m.   General appearance :Awake, alert, not in any distress.  Chronically sick appearing Eyes:, pupils equally reactive to light and accomodation,no scleral icterus. HEENT: Atraumatic and Normocephalic Neck: supple, no JVD. No cervical lymphadenopathy.  Resp:Good air entry bilaterally,few rales at left base CVS: S1 S2 regular, no murmurs.  GI: Bowel sounds present, Non tender and not distended with no gaurding, rigidity or rebound.No organomegaly Extremities: B/L Lower Ext shows + edema, both legs are warm to touch Neurology:  speech clear,Non focal, sensation is grossly intact. Psychiatric: Normal judgment and insight. Alert and oriented x 3. Normal mood. Musculoskeletal:No digital cyanosis Skin:No Rash, warm and dry Wounds:N/A  I have personally reviewed following labs and imaging studies  LABORATORY DATA: CBC: Recent Labs  Lab 01/06/18 0904 01/07/18 0634 01/08/18 4193 01/09/18 0558 01/10/18 0833 01/11/18 0329 01/12/18 0423  WBC 15.1* 15.3* 13.6* 15.1* 14.4* 12.3* 11.7*  NEUTROABS 13.5* 13.3*  --   --  12.4* 10.5* 9.9*  HGB 9.3* 9.0* 9.3* 9.3* 9.2* 9.0* 9.1*  HCT 30.4* 28.9* 29.6* 28.7* 29.5* 28.7* 28.8*  MCV 104.1* 102.5* 101.7* 100.3* 103.5* 104.0* 103.6*  PLT 121* 123* 124* 131* 116* 108* 111*    Basic Metabolic Panel: Recent  Labs  Lab 01/07/18 0634 01/08/18 0632 01/09/18 0558 01/10/18 0806 01/11/18 0329 01/12/18 0423  NA 135 138 136 140 139 137  K 3.4* 3.6 3.5 3.5 3.3* 3.6  CL 104 107 104 105 104 104  CO2 24 26 24 25 25 24   GLUCOSE 163* 124* 116* 106* 128* 118*  BUN 15 13 14 15 15 15   CREATININE 0.90 0.85 0.88 0.89 1.06 0.94  CALCIUM 8.0* 8.1* 8.1* 8.1* 8.0* 7.9*  MG 1.9  --  1.6* 2.0 1.7 2.1    GFR: Estimated Creatinine Clearance: 74.4 mL/min (by C-G formula based on SCr of 0.94 mg/dL).  Liver Function Tests: No results for input(s): AST, ALT, ALKPHOS, BILITOT, PROT, ALBUMIN in the last 168 hours. No results for input(s): LIPASE, AMYLASE in the last 168 hours. No results for input(s): AMMONIA in the last 168 hours.  Coagulation Profile: No results for input(s): INR, PROTIME in the last 168 hours.  Cardiac Enzymes: No results for input(s): CKTOTAL, CKMB, CKMBINDEX, TROPONINI in the last 168 hours.  BNP (last 3 results) No results for input(s): PROBNP in the last 8760 hours.  HbA1C: No results for input(s): HGBA1C in the last 72 hours.  CBG: No results for  input(s): GLUCAP in the last 168 hours.  Lipid Profile: No results for input(s): CHOL, HDL, LDLCALC, TRIG, CHOLHDL, LDLDIRECT in the last 72 hours.  Thyroid Function Tests: No results for input(s): TSH, T4TOTAL, FREET4, T3FREE, THYROIDAB in the last 72 hours.  Anemia Panel: No results for input(s): VITAMINB12, FOLATE, FERRITIN, TIBC, IRON, RETICCTPCT in the last 72 hours.  Urine analysis:    Component Value Date/Time   COLORURINE YELLOW 01/05/2018 1640   APPEARANCEUR CLEAR 01/05/2018 1640   LABSPEC 1.043 (H) 01/05/2018 1640   PHURINE 5.0 01/05/2018 1640   GLUCOSEU NEGATIVE 01/05/2018 1640   HGBUR SMALL (A) 01/05/2018 1640   BILIRUBINUR NEGATIVE 01/05/2018 1640   KETONESUR NEGATIVE 01/05/2018 1640   PROTEINUR 100 (A) 01/05/2018 1640   UROBILINOGEN 0.2 05/06/2015 0935   NITRITE NEGATIVE 01/05/2018 1640   LEUKOCYTESUR  NEGATIVE 01/05/2018 1640    Sepsis Labs: Lactic Acid, Venous    Component Value Date/Time   LATICACIDVEN 0.99 01/05/2018 1525    MICROBIOLOGY: Recent Results (from the past 240 hour(s))  Blood Culture (routine x 2)     Status: None   Collection Time: 01/05/18 10:26 AM  Result Value Ref Range Status   Specimen Description   Final    BLOOD LEFT ANTECUBITAL Performed at [facility], [place] [address], [place], [place] [address]    Special Requests   Final    BOTTLES DRAWN AEROBIC AND ANAEROBIC Blood Culture adequate volume Performed at [place] [address], [place], [place] [address]    Culture   Final    NO GROWTH 5 DAYS Performed at [facility], [place] [address], [place], [place] [address]    Report Status 01/10/2018 FINAL  Final  Blood Culture (routine x 2)     Status: None   Collection Time: 01/05/18 10:30 AM  Result Value Ref Range Status   Specimen Description   Final    BLOOD LEFT WRIST Performed at [facility] [address], [place], [place] [address]    Special Requests   Final    BOTTLES DRAWN AEROBIC AND ANAEROBIC Blood Culture adequate volume Performed at [facility] [address], [place], [place] [address]    Culture   Final    NO GROWTH 5 DAYS Performed at [facility], [place] [address] [place], [place] [address]    Report Status 01/10/2018 FINAL  Final  MRSA PCR Screening     Status: None   Collection Time: 01/06/18 12:08 PM  Result Value Ref Range Status   MRSA by PCR NEGATIVE NEGATIVE Final    Comment:        The GeneXpert MRSA Assay (FDA approved for NASAL specimens only), is one component of a comprehensive MRSA colonization surveillance program. It is not intended to diagnose MRSA infection nor to guide or monitor treatment for MRSA infections. Performed at [facility], [place] [address], [place], [place] [address]      RADIOLOGY STUDIES/RESULTS: Dg Chest 2 View  Result Date: 01/05/2018 CLINICAL DATA:  Shortness of breath. Stage IV lung cancer. Pneumonia. EXAM: CHEST  2 VIEW COMPARISON:  Two-view chest x-ray and CT of the chest 12/14/2017. FINDINGS: Heart size is normal. A right-sided PICC line terminates in the distal SVC. The right lung is clear. Progressive left lower lobe airspace disease versus mass lesion is noted. Left upper lobe is clear. IMPRESSION: 1. Progressive left lower lobe airspace opacity. This likely represents post obstructive disease  or possibly lobar pneumonia in the setting of known neoplasm. 2. Right-sided PICC line in satisfactory position. Electronically Signed   By: San Morelle M.D.   On: 01/05/2018 10:16   Dg Chest 2 View  Result Date: 12/14/2017 CLINICAL DATA:  Cough and shortness of breath. EXAM: CHEST  2 VIEW COMPARISON:  CT chest dated November 24, 2017. Chest x-ray dated August 21, 2017. FINDINGS: Unchanged right chest wall port catheter with the tip in the mid SVC. The heart size and mediastinal contours are within normal limits. Normal pulmonary vascularity. Left basilar atelectasis. No focal consolidation, pleural effusion, or pneumothorax. No acute osseous abnormality. IMPRESSION: Left basilar atelectasis.  No active cardiopulmonary disease. Electronically Signed   By: Titus Dubin M.D.   On: 12/14/2017 17:01   Ct Angio Chest Pe W And/or Wo Contrast  Result Date: 01/05/2018 CLINICAL DATA:  Chest pain and shortness of breath. Cough. History of lung cancer. EXAM: CT ANGIOGRAPHY CHEST WITH CONTRAST TECHNIQUE: Multidetector CT imaging of the chest was performed using the standard protocol during bolus administration of intravenous contrast. Multiplanar CT image reconstructions and MIPs were obtained to evaluate the vascular anatomy. CONTRAST:  59mL ISOVUE-370 IOPAMIDOL (ISOVUE-370) INJECTION 76% COMPARISON:  CT scan 12/14/2017 FINDINGS: Cardiovascular: The heart is  normal in size and stable. No pericardial effusion. Stable mild tortuosity and atherosclerotic changes involving the thoracic aorta. No dissection. The branch vessels are patent. Stable scattered coronary artery calcifications. The pulmonary arterial tree is fairly well opacified. No filling defects to suggest pulmonary embolism. Mediastinum/Nodes: Persistent enlarged mediastinal and hilar lymph nodes. Infiltrating soft tissue in the left hilum is progressive and there is complete or near complete occlusion of the left lower lobe bronchus and also the lingular bronchus. Lungs/Pleura: Further narrowing of the right lower lobe bronchus with progressive dense airspace consolidation in the right lobe. Findings suspicious for progressive tumor and postobstructive pneumonia. There is a new left pleural effusion. Several ill-defined nodular airspace opacities in the lingula are likely nodular infiltrates. Stable underlying emphysema. No metastatic nodules in the right lung. Upper Abdomen: No significant upper abdominal findings. No obvious hepatic metastatic disease. Musculoskeletal: No significant bony findings. No chest wall mass, supraclavicular or axillary lymphadenopathy. Review of the MIP images confirms the above findings. IMPRESSION: 1. Progressive ill-defined soft tissue infiltrating the left hilum and mediastinum with now complete or near complete occlusion of the left lower lobe bronchus and lingular bronchus. This is favored to represent tumor. Bronchoscopy is recommended. Extensive postobstructive pneumonia, worsened since the prior study. 2. Stable to slightly larger mediastinal and hilar lymph nodes. 3. No CT findings for pulmonary embolism. 4. Stable emphysematous changes and pulmonary scarring. Aortic Atherosclerosis (ICD10-I70.0) and Emphysema (ICD10-J43.9). Electronically Signed   By: Marijo Sanes M.D.   On: 01/05/2018 13:54   Ct Angio Chest Pe W/cm &/or Wo Cm  Result Date: 12/14/2017 CLINICAL DATA:   Weakness over the past few days. States he had chemo 3 weeks ago and since then he has lost 15 lbs. Has reportedly had multiple reactions to the chemo. Stage 4 ca EXAM: CT ANGIOGRAPHY CHEST WITH CONTRAST TECHNIQUE: Multidetector CT imaging of the chest was performed using the standard protocol during bolus administration of intravenous contrast. Multiplanar CT image reconstructions and MIPs were obtained to evaluate the vascular anatomy. CONTRAST:  61mL ISOVUE-370 IOPAMIDOL (ISOVUE-370) INJECTION 76% COMPARISON:  CT chest, abdomen and pelvis, 11/24/2017. Current chest radiograph. FINDINGS: Cardiovascular: Contrast opacification of the pulmonary arteries is suboptimal, particularly to the mid to lower lung  segmental and subsegmental vessels. This limits assessment for pulmonary emboli. Allowing for this, there is no evidence of a central pulmonary embolus. Heart is normal in size and configuration. No pericardial effusion. There are three-vessel coronary artery calcifications. The thoracic aorta is normal in caliber. No dissection. Mild atherosclerosis is noted along the aortic arch and descending thoracic aorta. Mediastinum/Nodes: Several prominent lymph nodes. There is a left paratracheal superior mediastinal node measuring 8 mm in short axis. There are anterior mediastinal lymph nodes, largest measuring 1.6 x 1.0 cm, lying adjacent to the left brachiocephalic vein. Allowing for differences in measurement technique, these are stable from the prior CT. There is increased soft tissue along the left hilar structures that is also stable. Trachea is widely patent. Esophagus is unremarkable. Lungs/Pleura: There is consolidation in the posteromedial left lower lobe. The dense consolidation is surrounded by hazy ground-glass opacity. There is bronchial wall thickening of the lower lobe segmental bronchi. There is a smaller area of consolidation in the posterolateral left upper lobe centered on image 81, series 10. There  small nodular components to this. There is a spiculated nodule in the subpleural left lower lobe measuring 10 x 8 cm, which was present previously. There are no other discrete lung nodules. There are no other areas of consolidation. There are stable areas of peripheral interstitial thickening/scarring as well as upper lobe predominant emphysema. Mild subsegmental atelectasis is noted in the right lower lobe. No pleural effusion.  No pneumothorax. Upper Abdomen: No acute findings. Musculoskeletal: No fracture or acute finding. No osteoblastic or osteolytic lesions. Review of the MIP images confirms the above findings. IMPRESSION: 1. Suboptimal exam limiting assessment for smaller pulmonary emboli. Allowing for this, there is no evidence of a pulmonary embolus. 2. Extensive left lower lobe consolidation consistent with pneumonia. 3. Findings of metastatic carcinoma noted on the prior chest, abdomen and pelvis CT are similar. Aortic Atherosclerosis (ICD10-I70.0) and Emphysema (ICD10-J43.9). Electronically Signed   By: Lajean Manes M.D.   On: 12/14/2017 19:56   Ir Removal [employer] W/ [place] W/o Fl Mod Sed  Result Date: 12/17/2017 INDICATION: 72 year old with pneumonia and bacteremia. Request for removal of the right jugular Port-A-Cath. Port-A-Cath was placed on 02/01/2017 by Dr. Vernard Gambles. EXAM: REMOVAL RIGHT IJ VEIN PORT-A-CATH MEDICATIONS: Ancef 2 g; The antibiotic was administered within an appropriate time interval prior to skin puncture. ANESTHESIA/SEDATION: Moderate (conscious) sedation was employed during this procedure. A total of Versed 1.0 mg and Fentanyl 50 mcg was administered intravenously. Moderate Sedation Time: 23 minutes. The patient's level of consciousness and vital signs were monitored continuously by radiology nursing throughout the procedure under my direct supervision. FLUOROSCOPY TIME:  None COMPLICATIONS: None immediate. PROCEDURE: Informed written consent was obtained from the patient after  a thorough discussion of the procedural risks, benefits and alternatives. All questions were addressed. Maximal Sterile Barrier Technique was utilized including caps, mask, sterile gowns, sterile gloves, sterile drape, hand hygiene and skin antiseptic. A timeout was performed prior to the initiation of the procedure. The right chest was prepped and draped in a sterile fashion. 1% Nesacaine was utilized for local anesthesia because of patient's lidocaine allergy. An incision was made over the previously healed surgical incision. Utilizing blunt dissection, the port catheter and reservoir were removed from the underlying subcutaneous tissue in their entirety. Port pocket was healthy looking. The pocket was irrigated with a copious amount of sterile normal saline. The pocket was closed with interrupted 3-0 Vicryl stitches. A 4-0 Vicryl running subcuticular stitch was utilized  to approximate the skin. Dermabond was applied. IMPRESSION: Successful right IJ vein Port-A-Cath explant. Electronically Signed   By: Markus Daft M.D.   On: 12/17/2017 15:49     LOS: 7 days   Oren Binet, MD  [facility] Pager:[phone]  If 7PM-7AM, please contact night-coverage [email or web address] Password [facility] 01/12/2018, 3:43 PM

## 2018-01-12 NOTE — Progress Notes (Signed)
Daily Progress Note   Patient Name: Nicholas Hale       Date: 01/12/2018 DOB: 1946-07-13  Age: 72 y.o. MRN#: [id number] Attending Physician: Jonetta Osgood, MD Primary Care Physician: [name], MD Admit Date: 01/05/2018  Reason for Consultation/Follow-up: Establishing goals of care, Non pain symptom management, Pain control and Psychosocial/spiritual support  Subjective: Patient is in bed and alert this morning. Wife at bedside.   She reports being hopeful that he will be able to be discharged tomorrow.  Her main concerns today is if plan will be for continuation of Remeron at discharge.  We discussed potential benefits in mood, sleep, and appetite and my recommendation that it be continued.  Discussed that discharging physician would prescribe enough medication to allow her to get to follow-up appointment with his PCP.  Briefly reviewed plan for continuation of radiation therapy followed by follow-up with Dr. [name].  Length of Stay: 7  Current Medications: Scheduled Meds:  . atorvastatin  20 mg Oral q1800  . budesonide (PULMICORT) nebulizer solution  0.5 mg Nebulization BID  . dextromethorphan-guaiFENesin  2 tablet Oral BID  . feeding supplement (ENSURE ENLIVE)  237 mL Oral BID BM  . heparin lock flush  250 Units Intracatheter Daily  . ipratropium  0.5 mg Nebulization TID  . levalbuterol  0.63 mg Nebulization TID  . mirtazapine  15 mg Oral QHS  . oxyCODONE-acetaminophen  1 tablet Oral Once  . rivaroxaban  20 mg Oral Q supper  . senna-docusate  1 tablet Oral QHS  . sodium chloride HYPERTONIC  4 mL Nebulization BID  . sucralfate  1 g Oral TID WC & HS    Continuous Infusions: . ampicillin-sulbactam (UNASYN) IV 3 g (01/12/18 1315)    PRN Meds: acetaminophen  **OR** acetaminophen, [DISCONTINUED] ipratropium **AND** albuterol, guaiFENesin-dextromethorphan, heparin lock flush **AND** heparin lock flush, HYDROcodone-homatropine, ketorolac, oxyCODONE, phenol, polyethylene glycol, prochlorperazine, sodium chloride, sodium chloride flush  Physical Exam  Constitutional: He appears well-developed. He is cooperative.  Pulmonary/Chest: Effort normal.  Cough at times   Musculoskeletal:       Right shoulder: He exhibits decreased strength.  Generalized weakness   Neurological: He is alert.  Awake and watching tv, engaging in conversation Resting in bed No edema Abdomen does not appear distended  Vital Signs: BP 129/77 (BP Location: Left Arm)   Pulse (!) 104   Temp 98.4 F (36.9 C) (Oral)   Resp 18   Ht 5\' 10"  (1.778 m)   Wt 82.2 kg (181 lb 3.5 oz)   SpO2 100%   BMI 26.00 kg/m  SpO2: SpO2: 100 % O2 Device: O2 Device: Nasal Cannula O2 Flow Rate: O2 Flow Rate (L/min): 1 L/min  Intake/output summary:   Intake/Output Summary (Last 24 hours) at 01/12/2018 1452 Last data filed at 01/12/2018 0900 Gross per 24 hour  Intake 670 ml  Output -  Net 670 ml   LBM: Last BM Date: 01/09/18 Baseline Weight: Weight: 82.2 kg (181 lb 3.5 oz) Most recent weight: Weight: 82.2 kg (181 lb 3.5 oz)       Palliative Assessment/Data: PPS 30%   Flowsheet Rows     Most Recent Value  Intake Tab  Referral Department  Hospitalist  Unit at Time of Referral  Oncology Unit  Palliative Care Primary Diagnosis  Cancer  Date Notified  01/05/18  Palliative Care Type  New Palliative care  Reason for referral  Clarify Goals of Care  Date of Admission  01/05/18  Date first seen by Palliative Care  01/06/18  # of days Palliative referral response time  1 Day(s)  # of days IP prior to Palliative referral  0  Clinical Assessment  Palliative Performance Scale Score  40%  Pain Max last 24 hours  4  Pain Min Last 24 hours  3  Dyspnea Max Last 24 Hours  6    Dyspnea Min Last 24 hours  4  Nausea Max Last 24 Hours  2  Nausea Min Last 24 Hours  1  Anxiety Max Last 24 Hours  5  Anxiety Min Last 24 Hours  4  Psychosocial & Spiritual Assessment  Palliative Care Outcomes  Patient/Family meeting held?  Yes  Who was at the meeting?  patient, wife, daughter.   Palliative Care Outcomes  Clarified goals of care      Patient Active Problem List   Diagnosis Date Noted  . Encounter for palliative care   . Hypokalemia   . Elevated TSH   . Postobstructive pneumonia 01/05/2018  . Sepsis due to pneumonia ([place]) 01/05/2018  . 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 Staphylococcus aureus bacteremia   . Bacteremia   . Malnutrition of moderate degree 12/17/2017  . Pneumonia 12/14/2017  . Leukocytosis 12/14/2017  . Anemia 12/14/2017  . Hyponatremia 12/14/2017  . HCAP (healthcare-associated pneumonia)   . Influenza vaccination administered at current visit 09/02/2017  . Gingival bleeding 08/21/2017  . Thrombocytopenia ([place]) 08/21/2017  . Dyspnea on exertion 04/05/2017  . Chemotherapy induced neutropenia ([place]) 03/31/2017  . Port catheter in place 03/03/2017  . Encounter for antineoplastic chemotherapy 02/17/2017  . Adenocarcinoma of left lung, stage 4 ([place]) 10/05/2016  . Goals of care, counseling/discussion 10/05/2016  . Chronic fatigue 10/05/2016  . Lung cancer ([place]) 09/25/2016  . Acute deep vein thrombosis (DVT) of proximal vein of both lower extremities ([facility])   . Cardiac tamponade   . Elevated troponin 09/20/2016  . 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 Ventricular tachycardia ([place]) 09/19/2016  . Renal cyst, right 09/19/2016  . Liver lesion 09/19/2016  . Nonsustained ventricular tachycardia ([place])   . History of tobacco abuse 01/28/2016  . Primary osteoarthritis of right hip 05/10/2015  . 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 Arthritis of left hip 05/23/2014  . Hypertension 05/15/2014  .  Hyperlipidemia 05/15/2014     Palliative Care Assessment & Plan   Patient Profile: 72 year old gentleman with a past medical history significant for metastatic non-small cell lung cancer, adenocarcinoma, patient is under the care of Dr. Rogue Jury, DVT/PE (Xarelto), HTN, and AAA. He has undergone several chemotherapy and immunotherapy regimens as well as palliative radiation. Most recently, the patient was placed on systemic chemotherapy and is now hospitalized with ongoing weakness, postobstructive pneumonia, CT scan of the chest now showing progressive soft tissue mass in the left hilum and mediastinum with almost complete obstruction of left lower lobe bronchus and lingular bronchus.    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 Worsening generalized weakness, deconditioning.  Sore throat Difficulty swallowing larger pills  Recommendations/Plan:  Chloraseptic throat spray to bedside, use PRN  Remeron Q HS to help with mood, appetite and sleep. Most of our conversation today centered around these issues and choice of Remeron for depression and sleep.  I would recommend continuation of current dose (15mg  QHS) on discharge.  Oxycodone PRN for pain control   Hycodan PRN for cough (may also use oxycodone if more effective)  Continue with PRN nebulizers for wheezing and shortness of breath   Continue with palliative radiation treatments per Dr. Sondra Come  May need out patient palliative care at home or at cancer center.   Continue supportive care  Antibiotics and further hospital care as per my colleague Dr Grandville Silos.   Goals: Patient and family wish to continue current mode of care, remain hopeful for some degree of  stabililization/recovery. Per Oncologist if improvement s/p radiation may consider systemic chemotherapy. After discharge, and after palliative radiation therapy is completed, should the patient have an ongoing increased symptom burden leading to an unacceptable further lowering of his quality of life, then, at that point, patient and family would like to instate hospice services at home. Continue with palliative care for now.    Goals of Care and Additional Recommendations:  Limitations on Scope of Treatment: DNR with Full Scope Treatment  Code Status:    Code Status Orders  (From admission, onward)        Start     Ordered   01/05/18 2008  Do not attempt resuscitation (DNR)  Continuous    Question Answer Comment  In the event of cardiac or respiratory ARREST Do not call a "code blue"   In the event of cardiac or respiratory ARREST Do not perform Intubation, CPR, defibrillation or ACLS   In the event of cardiac or respiratory ARREST Use medication by any route, position, wound care, and other measures to relive pain and suffering. May use oxygen, suction and manual treatment of airway obstruction as needed for comfort.   Comments Confirmed with patient and family 2/6      01/05/18 2008    Code Status History    Date Active Date Inactive Code Status Order ID Comments User Context   01/05/2018 17:24 01/05/2018 19:59 DNR [id number]  Patrecia Pour, MD Inpatient   12/15/2017 08:19 12/24/2017 17:37 Full Code [id number]  Jani Gravel, MD ED   08/21/2017 13:39 08/22/2017 17:20 Full Code [id number]  Truett Mainland, DO ED   02/01/2017 15:52 02/02/2017 03:10 Full Code [id number]  Arne Cleveland, MD HOV  09/19/2016 22:36 09/23/2016 16:45 Full Code [id number]  Ivor Costa, MD Inpatient   05/15/2015 17:53 05/17/2015 13:50 Full Code [id number]  Frederik Pear, MD Inpatient   08/07/2014 10:00 08/08/2014 14:40 Full Code [id number]  Wilhelmina Mcardle, MD Inpatient   08/06/2014 14:51 08/07/2014 10:00 Full Code [id number]  Sandi Mariscal, MD Inpatient   08/05/2014 20:48 08/06/2014 14:51 Full Code [id number]  Marybelle Killings, MD Inpatient   08/05/2014 17:30 08/05/2014 20:48 Full Code [id number]  Doree Fudge, MD ED   05/23/2014 15:27 05/25/2014 17:18 Full Code [id number]  Kerin Salen, MD Inpatient    Advance Directive Documentation     Most Recent Value  Type of Advance Directive  Healthcare Power of Attorney, Living will  Pre-existing out of facility DNR order (yellow form or pink MOST form)  No data  "MOST" Form in Place?  No data      Prognosis:   Guarded potentially poor prognosis depending on course s/p radiation.  Discharge Planning:  Home with Home Health and possible outpatient palliative vs. Cancer center services  Care plan was discussed with patient, wife.  Thank you for allowing the Palliative Medicine Team to assist in the care of this patient.   Total Time 30 min Prolonged Time Billed  No      Greater than 50%  of this time was spent counseling and coordinating care related to the above assessment and plan.  Micheline Rough, MD [facility] Team [phone]  Please contact Palliative Medicine Team phone at [phone] for questions and concerns.

## 2018-01-12 NOTE — Progress Notes (Signed)
Nutrition Follow-up  DOCUMENTATION CODES:   Not applicable  INTERVENTION:   Ensure Enlive po BID, each supplement provides 350 kcal and 20 grams of protein  NUTRITION DIAGNOSIS:   Increased nutrient needs related to cancer and cancer related treatments as evidenced by estimated needs.  Ongoing  GOAL:   Patient will meet greater than or equal to 90% of their needs  Meeting  MONITOR:   PO intake, Supplement acceptance, Weight trends, Labs  REASON FOR ASSESSMENT:   Malnutrition Screening Tool    ASSESSMENT:   Pt with PMH significant for stage IV NSCLC s/p chemotherapy and XRT (December 2018), HTN, and HLD. Recently admitted for sepsis secondary to pneumonia, discharged 1/25. Presents this admission with sepsis related to postobstructive LLL pneumonia.    MD notes 10 day course of antibiotics finished. Pt stable for discharge possibly tomorrow. Plan is to finish radiation treatments per Oncology. Pt's appetite remains stable consuming 75-100% at each meal. Pt is consuming two Ensures per day. Encouraged pt to continue this practice at home and increase if necessary. Wife states they are stocked with Enlive at home. No recent weight has been obtained since last RD visit.   Medications reviewed and include: carafate, IV abx Labs reviewed.   Diet Order:  Diet regular Room service appropriate? Yes; Fluid consistency: Thin  EDUCATION NEEDS:      Skin:  Skin Assessment: Reviewed RN Assessment  Last BM:  01/09/18  Height:   Ht Readings from Last 1 Encounters:  01/05/18 5\' 10"  (1.778 m)    Weight:   Wt Readings from Last 1 Encounters:  01/05/18 181 lb 3.5 oz (82.2 kg)    Ideal Body Weight:  75.5 kg  BMI:  Body mass index is 26 kg/m.  Estimated Nutritional Needs:   Kcal:  2000-2200 kcal/day  Protein:  105-115 g/day  Fluid:  >2 L/day    Mariana Single RD, LDN Clinical Nutrition Pager # - [phone]

## 2018-01-13 ENCOUNTER — Other Ambulatory Visit: Payer: Medicare Other

## 2018-01-13 ENCOUNTER — Ambulatory Visit
Admit: 2018-01-13 | Discharge: 2018-01-13 | Disposition: A | Discharge status: Home / Self Care | Payer: Medicare Other | Attending: Radiation Oncology | Admitting: Radiation Oncology

## 2018-01-13 MED ORDER — ENSURE ENLIVE PO LIQD: 237.0000 mL | Freq: Two times a day (BID) | ORAL | 0 refills | Status: AC

## 2018-01-13 MED ORDER — METOPROLOL TARTRATE 25 MG PO TABS: 12.5000 mg | ORAL_TABLET | Freq: Two times a day (BID) | ORAL | 0 refills | Status: AC

## 2018-01-13 MED ORDER — LEVALBUTEROL HCL 0.63 MG/3ML IN NEBU: 0.6300 mg | INHALATION_SOLUTION | Freq: Three times a day (TID) | RESPIRATORY_TRACT | 0 refills | Status: AC

## 2018-01-13 MED ORDER — AMOXICILLIN-POT CLAVULANATE 875-125 MG PO TABS: 1.0000 | ORAL_TABLET | Freq: Two times a day (BID) | ORAL | 0 refills | Status: DC

## 2018-01-13 MED ORDER — IPRATROPIUM BROMIDE 0.02 % IN SOLN: 0.5000 mg | Freq: Three times a day (TID) | RESPIRATORY_TRACT | 0 refills | Status: AC

## 2018-01-13 MED ORDER — MIRTAZAPINE 15 MG PO TBDP: 15.0000 mg | ORAL_TABLET | Freq: Every day | ORAL | 0 refills | Status: AC

## 2018-01-13 NOTE — Discharge Summary (Signed)
PATIENT DETAILS Name: Nicholas Hale Age: 72 y.o. Sex: male Date of Birth: 28-Feb-1946 MRN: [id number]. Admitting Physician: Patrecia Pour, MD [id number], [name], MD  Admit Date: 01/05/2018 Discharge date: 01/13/2018  Recommendations for Outpatient Follow-up:  1. Follow up with PCP in 1-2 weeks 2. Please obtain BMP/CBC in one week 3. Repeat TSH in the next few months (see below) 4. Ensure follow-up with radiation oncology and medical oncology.  Admitted From:  Home  Disposition: Home with home health services   Home Health: Yes  Equipment/Devices: None  Discharge Condition: Stable  CODE STATUS: DNR  Diet recommendation:  Heart Healthy   Brief Summary: See H&P, Labs, Consult and Test reports for all details in brief,Patient is a 72 y.o. 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    Brief Hospital Course: Sepsis secondary to postobstructive pneumonia: Sepsis pathophysiology has resolved-was treated with broad-spectrum antimicrobial therapy-with clinical improvement-we will switch to Augmentin for a few more days on discharge.  Continue supportive care with bronchodilators, Mucinex and other measures.  Patient was evaluated by both oncology, pulmonology and subsequently radiation oncology.  Pulmonology did not feel that bronchoscopy was warranted in this situation.  After much discussion, plans are to resume palliative radiation in the outpatient setting.  He is stable otherwise-he was seen by palliative care during this hospital stay-he is a DNR.  His improvement has plateaued over the past few days-I suspect he is stable to be discharged home with home health services.     Stage IV non-small cell lung cancer: Followed by Dr.  Mohammed-completed chemotherapy 2018.  Seen by oncology during this hospital stay-with  plans to restart palliative radiation.  History of venous thromboembolic: Continue Xarelto-recent CT angiogram of the chest was negative for pulmonary embolism  Mild sinus tachycardia: Per patient and family-heart rate runs in the low 100s at times-we will start a low-dose metoprolol.  Follow-up with [facility] MD for further optimization.  Anemia: Appears to be mild-probably secondary to chronic disease-stable for outpatient workup  Thrombocytopenia: Appears to be mild-no overt bleeding-plans are to follow  Subclinical hypothyroidism:  His TSH was found to be around 17-with normal free T4.  He does not have any signs of hypothyroidism-his generalized weakness and other issues are from underlying malignancy.  I suspect we could repeat another TSH in the next few months before thinking about starting Synthroid.  Procedures/Studies: None  Discharge Diagnoses:  Principal Problem:   Postobstructive pneumonia Active Problems:   Adenocarcinoma of left lung, stage 4 (HCC)   Thrombocytopenia (HCC)   Anemia   Sepsis due to pneumonia ([place])   Hypokalemia   Elevated TSH   Encounter for palliative care   Discharge Instructions:  Activity:  As tolerated with Full fall precautions use walker/cane & assistance as needed  Discharge Instructions    DME Nebulizer machine   Complete by:  As directed    Patient needs a nebulizer to treat with the following condition:  Respiratory failure (HCC)   Diet - low sodium heart healthy   Complete by:  As directed    Discharge instructions   Complete by:  As directed    Follow with Primary MD  Rankins, [name], MD, Dr [name] and Radiation Oncology as scheduled.    Please get a complete blood count and  chemistry panel checked by your Primary MD at your next visit, and again as instructed by your Primary MD.  Get Medicines reviewed and adjusted: Please take  all your medications with you for your next visit with your Primary MD  Laboratory/radiological data: Please request your Primary MD to go over all hospital tests and procedure/radiological results at the follow up, please ask your Primary MD to get all Hospital records sent to his/her office.  In some cases, they will be blood work, cultures and biopsy results pending at the time of your discharge. Please request that your primary care M.D. follows up on these results.  Also Note the following: If you experience worsening of your admission symptoms, develop shortness of breath, life threatening emergency, suicidal or homicidal thoughts you must seek medical attention immediately by calling 911 or calling your MD immediately  if symptoms less severe.  You must read complete instructions/literature along with all the possible adverse reactions/side effects for all the Medicines you take and that have been prescribed to you. Take any new Medicines after you have completely understood and accpet all the possible adverse reactions/side effects.   Do not drive when taking Pain medications or sleeping medications (Benzodaizepines)  Do not take more than prescribed Pain, Sleep and Anxiety Medications. It is not advisable to combine anxiety,sleep and pain medications without talking with your primary care practitioner  Special Instructions: If you have smoked or chewed Tobacco  in the last 2 yrs please stop smoking, stop any regular Alcohol  and or any Recreational drug use.  Wear Seat belts while driving.  Please note: You were cared for by a hospitalist during your hospital stay. Once you are discharged, your primary care physician will handle any further medical issues. Please note that NO REFILLS for any discharge medications will be authorized once you are discharged, as it is imperative that you return to your primary care physician (or establish a relationship with a primary care physician if you  do not have one) for your post hospital discharge needs so that they can reassess your need for medications and monitor your lab values.   Increase activity slowly   Complete by:  As directed      Allergies as of 01/13/2018      Reactions   Lidocaine Swelling   "Hard white blisters" formed on tongue, swollen mouth      Medication List    STOP taking these medications   lidocaine 2 % solution Commonly known as:  XYLOCAINE     TAKE these medications   albuterol 108 (90 Base) MCG/ACT inhaler Commonly known as:  PROVENTIL HFA;VENTOLIN HFA Inhale 2 puffs into the lungs every 4 (four) hours as needed for wheezing or shortness of breath.   amoxicillin-clavulanate 875-125 MG tablet Commonly known as:  AUGMENTIN Take 1 tablet by mouth 2 (two) times daily.   atorvastatin 20 MG tablet Commonly known as:  LIPITOR TAKE 1 TABLET BY MOUTH EVERY DAY AT 6PM   dexamethasone 4 MG tablet Commonly known as:  DECADRON 4 mg by mouth twice a day the day before, day of and day after the chemotherapy every 3 weeks.   diphenhydrAMINE 25 MG tablet Commonly known as:  BENADRYL Take 1 tablet (25 mg total) by mouth at bedtime as needed for sleep.   feeding supplement (ENSURE ENLIVE) Liqd Take 237 mLs by mouth 2 (two) times daily between meals.   ipratropium 0.02 % nebulizer solution Commonly known as:  ATROVENT Take 2.5 mLs (  0.5 mg total) by nebulization 3 (three) times daily.   levalbuterol 0.63 MG/3ML nebulizer solution Commonly known as:  XOPENEX Take 3 mLs (0.63 mg total) by nebulization 3 (three) times daily.   lidocaine-prilocaine cream Commonly known as:  EMLA Apply 1 application topically daily as needed (on port site).   magic mouthwash Soln Take 5 mLs by mouth 4 (four) times daily. Prednisone 80 mg , Nystatin 30 ml, Benadryl 12.5 mg/5 ml. Mix to 240 ml, 1:1:1 concentration.   Melatonin 3 MG Tabs Take 1 tablet by mouth at bedtime as needed (sleep).   metoprolol tartrate 25 MG  tablet Commonly known as:  LOPRESSOR Take 0.5 tablets (12.5 mg total) by mouth 2 (two) times daily.   mirtazapine 15 MG disintegrating tablet Commonly known as:  REMERON SOL-TAB Take 1 tablet (15 mg total) by mouth at bedtime.   MUCINEX DM PO Take 20 mLs by mouth 2 (two) times daily as needed (chest congestion).   oxyCODONE 5 MG/5ML solution Commonly known as:  ROXICODONE Take 5 mLs (5 mg total) by mouth every 4 (four) hours as needed for severe pain.   polyethylene glycol packet Commonly known as:  MIRALAX / GLYCOLAX Take 17 g by mouth daily. What changed:    when to take this  reasons to take this   prochlorperazine 10 MG tablet Commonly known as:  COMPAZINE Take 1 tablet (10 mg total) by mouth every 6 (six) hours as needed for nausea or vomiting.   senna-docusate 8.6-50 MG tablet Commonly known as:  Senokot-S Take 1 tablet by mouth at bedtime.   sodium chloride 0.65 % Soln nasal spray Commonly known as:  OCEAN Place 2 sprays into both nostrils as needed for congestion.   sucralfate 1 g tablet Commonly known as:  CARAFATE Take 1 tablet (1 g total) by mouth 4 (four) times daily -  with meals and at bedtime. Crush and dissolve in 10 cc of water prior to swallowing What changed:    when to take this  additional instructions   XARELTO 20 MG Tabs tablet Generic drug:  rivaroxaban TAKE 1 TABLET BY MOUTH EVERY DAY            Durable Medical Equipment  (From admission, onward)        Start     Ordered   01/13/18 0000  DME Nebulizer machine    Question:  Patient needs a nebulizer to treat with the following condition  Answer:  Respiratory failure ([place])   01/13/18 0912      Allergies  Allergen Reactions  . Lidocaine Swelling    "Hard white blisters" formed on tongue, swollen mouth   Consultations:   pulmonary/intensive care, hematology/oncology and Radiation oncology and palliative care  Other Procedures/Studies: Dg Chest 2 View  Result Date:  01/05/2018 CLINICAL DATA:  Shortness of breath. Stage IV lung cancer. Pneumonia. EXAM: CHEST  2 VIEW COMPARISON:  Two-view chest x-ray and CT of the chest 12/14/2017. FINDINGS: Heart size is normal. A right-sided PICC line terminates in the distal SVC. The right lung is clear. Progressive left lower lobe airspace disease versus mass lesion is noted. Left upper lobe is clear. IMPRESSION: 1. Progressive left lower lobe airspace opacity. This likely represents post obstructive disease or possibly lobar pneumonia in the setting of known neoplasm. 2. Right-sided PICC line in satisfactory position. Electronically Signed   By: San Morelle M.D.   On: 01/05/2018 10:16   Dg Chest 2 View  Result Date: 12/14/2017 CLINICAL DATA:  Cough and shortness of breath. EXAM: CHEST  2 VIEW COMPARISON:  CT chest dated November 24, 2017. Chest x-ray dated August 21, 2017. FINDINGS: Unchanged right chest wall port catheter with the tip in the mid SVC. The heart size and mediastinal contours are within normal limits. Normal pulmonary vascularity. Left basilar atelectasis. No focal consolidation, pleural effusion, or pneumothorax. No acute osseous abnormality. IMPRESSION: Left basilar atelectasis.  No active cardiopulmonary disease. Electronically Signed   By: Titus Dubin M.D.   On: 12/14/2017 17:01   Ct Angio Chest Pe W And/or Wo Contrast  Result Date: 01/05/2018 CLINICAL DATA:  Chest pain and shortness of breath. Cough. History of lung cancer. EXAM: CT ANGIOGRAPHY CHEST WITH CONTRAST TECHNIQUE: Multidetector CT imaging of the chest was performed using the standard protocol during bolus administration of intravenous contrast. Multiplanar CT image reconstructions and MIPs were obtained to evaluate the vascular anatomy. CONTRAST:  64mL ISOVUE-370 IOPAMIDOL (ISOVUE-370) INJECTION 76% COMPARISON:  CT scan 12/14/2017 FINDINGS: Cardiovascular: The heart is normal in size and stable. No pericardial effusion. Stable mild  tortuosity and atherosclerotic changes involving the thoracic aorta. No dissection. The branch vessels are patent. Stable scattered coronary artery calcifications. The pulmonary arterial tree is fairly well opacified. No filling defects to suggest pulmonary embolism. Mediastinum/Nodes: Persistent enlarged mediastinal and hilar lymph nodes. Infiltrating soft tissue in the left hilum is progressive and there is complete or near complete occlusion of the left lower lobe bronchus and also the lingular bronchus. Lungs/Pleura: Further narrowing of the right lower lobe bronchus with progressive dense airspace consolidation in the right lobe. Findings suspicious for progressive tumor and postobstructive pneumonia. There is a new left pleural effusion. Several ill-defined nodular airspace opacities in the lingula are likely nodular infiltrates. Stable underlying emphysema. No metastatic nodules in the right lung. Upper Abdomen: No significant upper abdominal findings. No obvious hepatic metastatic disease. Musculoskeletal: No significant bony findings. No chest wall mass, supraclavicular or axillary lymphadenopathy. Review of the MIP images confirms the above findings. IMPRESSION: 1. Progressive ill-defined soft tissue infiltrating the left hilum and mediastinum with now complete or near complete occlusion of the left lower lobe bronchus and lingular bronchus. This is favored to represent tumor. Bronchoscopy is recommended. Extensive postobstructive pneumonia, worsened since the prior study. 2. Stable to slightly larger mediastinal and hilar lymph nodes. 3. No CT findings for pulmonary embolism. 4. Stable emphysematous changes and pulmonary scarring. Aortic Atherosclerosis (ICD10-I70.0) and Emphysema (ICD10-J43.9). Electronically Signed   By: Marijo Sanes M.D.   On: 01/05/2018 13:54   Ct Angio Chest Pe W/cm &/or Wo Cm  Result Date: 12/14/2017 CLINICAL DATA:  Weakness over the past few days. States he had chemo 3 weeks  ago and since then he has lost 15 lbs. Has reportedly had multiple reactions to the chemo. Stage 4 ca EXAM: CT ANGIOGRAPHY CHEST WITH CONTRAST TECHNIQUE: Multidetector CT imaging of the chest was performed using the standard protocol during bolus administration of intravenous contrast. Multiplanar CT image reconstructions and MIPs were obtained to evaluate the vascular anatomy. CONTRAST:  63mL ISOVUE-370 IOPAMIDOL (ISOVUE-370) INJECTION 76% COMPARISON:  CT chest, abdomen and pelvis, 11/24/2017. Current chest radiograph. FINDINGS: Cardiovascular: Contrast opacification of the pulmonary arteries is suboptimal, particularly to the mid to lower lung segmental and subsegmental vessels. This limits assessment for pulmonary emboli. Allowing for this, there is no evidence of a central pulmonary embolus. Heart is normal in size and configuration. No pericardial effusion. There are three-vessel coronary artery calcifications. The thoracic aorta is normal in  caliber. No dissection. Mild atherosclerosis is noted along the aortic arch and descending thoracic aorta. Mediastinum/Nodes: Several prominent lymph nodes. There is a left paratracheal superior mediastinal node measuring 8 mm in short axis. There are anterior mediastinal lymph nodes, largest measuring 1.6 x 1.0 cm, lying adjacent to the left brachiocephalic vein. Allowing for differences in measurement technique, these are stable from the prior CT. There is increased soft tissue along the left hilar structures that is also stable. Trachea is widely patent. Esophagus is unremarkable. Lungs/Pleura: There is consolidation in the posteromedial left lower lobe. The dense consolidation is surrounded by hazy ground-glass opacity. There is bronchial wall thickening of the lower lobe segmental bronchi. There is a smaller area of consolidation in the posterolateral left upper lobe centered on image 81, series 10. There small nodular components to this. There is a spiculated nodule  in the subpleural left lower lobe measuring 10 x 8 cm, which was present previously. There are no other discrete lung nodules. There are no other areas of consolidation. There are stable areas of peripheral interstitial thickening/scarring as well as upper lobe predominant emphysema. Mild subsegmental atelectasis is noted in the right lower lobe. No pleural effusion.  No pneumothorax. Upper Abdomen: No acute findings. Musculoskeletal: No fracture or acute finding. No osteoblastic or osteolytic lesions. Review of the MIP images confirms the above findings. IMPRESSION: 1. Suboptimal exam limiting assessment for smaller pulmonary emboli. Allowing for this, there is no evidence of a pulmonary embolus. 2. Extensive left lower lobe consolidation consistent with pneumonia. 3. Findings of metastatic carcinoma noted on the prior chest, abdomen and pelvis CT are similar. Aortic Atherosclerosis (ICD10-I70.0) and Emphysema (ICD10-J43.9). Electronically Signed   By: Lajean Manes M.D.   On: 12/14/2017 19:56   Ir Removal [employer] W/ [place] W/o Fl Mod Sed  Result Date: 12/17/2017 INDICATION: 72 year old with pneumonia and bacteremia. Request for removal of the right jugular Port-A-Cath. Port-A-Cath was placed on 02/01/2017 by Dr. Vernard Gambles. EXAM: REMOVAL RIGHT IJ VEIN PORT-A-CATH MEDICATIONS: Ancef 2 g; The antibiotic was administered within an appropriate time interval prior to skin puncture. ANESTHESIA/SEDATION: Moderate (conscious) sedation was employed during this procedure. A total of Versed 1.0 mg and Fentanyl 50 mcg was administered intravenously. Moderate Sedation Time: 23 minutes. The patient's level of consciousness and vital signs were monitored continuously by radiology nursing throughout the procedure under my direct supervision. FLUOROSCOPY TIME:  None COMPLICATIONS: None immediate. PROCEDURE: Informed written consent was obtained from the patient after a thorough discussion of the procedural risks, benefits and  alternatives. All questions were addressed. Maximal Sterile Barrier Technique was utilized including caps, mask, sterile gowns, sterile gloves, sterile drape, hand hygiene and skin antiseptic. A timeout was performed prior to the initiation of the procedure. The right chest was prepped and draped in a sterile fashion. 1% Nesacaine was utilized for local anesthesia because of patient's lidocaine allergy. An incision was made over the previously healed surgical incision. Utilizing blunt dissection, the port catheter and reservoir were removed from the underlying subcutaneous tissue in their entirety. Port pocket was healthy looking. The pocket was irrigated with a copious amount of sterile normal saline. The pocket was closed with interrupted 3-0 Vicryl stitches. A 4-0 Vicryl running subcuticular stitch was utilized to approximate the skin. Dermabond was applied. IMPRESSION: Successful right IJ vein Port-A-Cath explant. Electronically Signed   By: Markus Daft M.D.   On: 12/17/2017 15:49      TODAY-DAY OF DISCHARGE:  Subjective:   Nicholas Hale today has  no headache,no chest abdominal pain,no new weakness tingling or numbness, feels much better wants to go home today.   Objective:   Blood pressure 139/74, pulse (!) 114, temperature 98.6 F (37 C), temperature source Oral, resp. rate 16, height 5\' 10"  (1.778 m), weight 82.2 kg (181 lb 3.5 oz), SpO2 94 %.  Intake/Output Summary (Last 24 hours) at 01/13/2018 0912 Last data filed at 01/12/2018 1759 Gross per 24 hour  Intake 580 ml  Output -  Net 580 ml   Filed Weights   01/05/18 1713  Weight: 82.2 kg (181 lb 3.5 oz)    Exam: Awake Alert, Oriented *3, No new F.N deficits, Normal affect [place].AT,PERRAL Supple Neck,No JVD, No cervical lymphadenopathy appriciated.  Symmetrical Chest wall movement, Good air movement bilaterally, CTAB RRR,No Gallops,Rubs or new Murmurs, No Parasternal Heave +ve B.Sounds, Abd Soft, Non tender, No organomegaly  appriciated, No rebound -guarding or rigidity. No Cyanosis, Clubbing or edema, No new Rash or bruise   PERTINENT RADIOLOGIC STUDIES: Dg Chest 2 View  Result Date: 01/05/2018 CLINICAL DATA:  Shortness of breath. Stage IV lung cancer. Pneumonia. EXAM: CHEST  2 VIEW COMPARISON:  Two-view chest x-ray and CT of the chest 12/14/2017. FINDINGS: Heart size is normal. A right-sided PICC line terminates in the distal SVC. The right lung is clear. Progressive left lower lobe airspace disease versus mass lesion is noted. Left upper lobe is clear. IMPRESSION: 1. Progressive left lower lobe airspace opacity. This likely represents post obstructive disease or possibly lobar pneumonia in the setting of known neoplasm. 2. Right-sided PICC line in satisfactory position. Electronically Signed   By: San Morelle M.D.   On: 01/05/2018 10:16   Dg Chest 2 View  Result Date: 12/14/2017 CLINICAL DATA:  Cough and shortness of breath. EXAM: CHEST  2 VIEW COMPARISON:  CT chest dated November 24, 2017. Chest x-ray dated August 21, 2017. FINDINGS: Unchanged right chest wall port catheter with the tip in the mid SVC. The heart size and mediastinal contours are within normal limits. Normal pulmonary vascularity. Left basilar atelectasis. No focal consolidation, pleural effusion, or pneumothorax. No acute osseous abnormality. IMPRESSION: Left basilar atelectasis.  No active cardiopulmonary disease. Electronically Signed   By: Titus Dubin M.D.   On: 12/14/2017 17:01   Ct Angio Chest Pe W And/or Wo Contrast  Result Date: 01/05/2018 CLINICAL DATA:  Chest pain and shortness of breath. Cough. History of lung cancer. EXAM: CT ANGIOGRAPHY CHEST WITH CONTRAST TECHNIQUE: Multidetector CT imaging of the chest was performed using the standard protocol during bolus administration of intravenous contrast. Multiplanar CT image reconstructions and MIPs were obtained to evaluate the vascular anatomy. CONTRAST:  66mL ISOVUE-370 IOPAMIDOL  (ISOVUE-370) INJECTION 76% COMPARISON:  CT scan 12/14/2017 FINDINGS: Cardiovascular: The heart is normal in size and stable. No pericardial effusion. Stable mild tortuosity and atherosclerotic changes involving the thoracic aorta. No dissection. The branch vessels are patent. Stable scattered coronary artery calcifications. The pulmonary arterial tree is fairly well opacified. No filling defects to suggest pulmonary embolism. Mediastinum/Nodes: Persistent enlarged mediastinal and hilar lymph nodes. Infiltrating soft tissue in the left hilum is progressive and there is complete or near complete occlusion of the left lower lobe bronchus and also the lingular bronchus. Lungs/Pleura: Further narrowing of the right lower lobe bronchus with progressive dense airspace consolidation in the right lobe. Findings suspicious for progressive tumor and postobstructive pneumonia. There is a new left pleural effusion. Several ill-defined nodular airspace opacities in the lingula are likely nodular infiltrates. Stable underlying  emphysema. No metastatic nodules in the right lung. Upper Abdomen: No significant upper abdominal findings. No obvious hepatic metastatic disease. Musculoskeletal: No significant bony findings. No chest wall mass, supraclavicular or axillary lymphadenopathy. Review of the MIP images confirms the above findings. IMPRESSION: 1. Progressive ill-defined soft tissue infiltrating the left hilum and mediastinum with now complete or near complete occlusion of the left lower lobe bronchus and lingular bronchus. This is favored to represent tumor. Bronchoscopy is recommended. Extensive postobstructive pneumonia, worsened since the prior study. 2. Stable to slightly larger mediastinal and hilar lymph nodes. 3. No CT findings for pulmonary embolism. 4. Stable emphysematous changes and pulmonary scarring. Aortic Atherosclerosis (ICD10-I70.0) and Emphysema (ICD10-J43.9). Electronically Signed   By: Marijo Sanes M.D.    On: 01/05/2018 13:54   Ct Angio Chest Pe W/cm &/or Wo Cm  Result Date: 12/14/2017 CLINICAL DATA:  Weakness over the past few days. States he had chemo 3 weeks ago and since then he has lost 15 lbs. Has reportedly had multiple reactions to the chemo. Stage 4 ca EXAM: CT ANGIOGRAPHY CHEST WITH CONTRAST TECHNIQUE: Multidetector CT imaging of the chest was performed using the standard protocol during bolus administration of intravenous contrast. Multiplanar CT image reconstructions and MIPs were obtained to evaluate the vascular anatomy. CONTRAST:  30mL ISOVUE-370 IOPAMIDOL (ISOVUE-370) INJECTION 76% COMPARISON:  CT chest, abdomen and pelvis, 11/24/2017. Current chest radiograph. FINDINGS: Cardiovascular: Contrast opacification of the pulmonary arteries is suboptimal, particularly to the mid to lower lung segmental and subsegmental vessels. This limits assessment for pulmonary emboli. Allowing for this, there is no evidence of a central pulmonary embolus. Heart is normal in size and configuration. No pericardial effusion. There are three-vessel coronary artery calcifications. The thoracic aorta is normal in caliber. No dissection. Mild atherosclerosis is noted along the aortic arch and descending thoracic aorta. Mediastinum/Nodes: Several prominent lymph nodes. There is a left paratracheal superior mediastinal node measuring 8 mm in short axis. There are anterior mediastinal lymph nodes, largest measuring 1.6 x 1.0 cm, lying adjacent to the left brachiocephalic vein. Allowing for differences in measurement technique, these are stable from the prior CT. There is increased soft tissue along the left hilar structures that is also stable. Trachea is widely patent. Esophagus is unremarkable. Lungs/Pleura: There is consolidation in the posteromedial left lower lobe. The dense consolidation is surrounded by hazy ground-glass opacity. There is bronchial wall thickening of the lower lobe segmental bronchi. There is a smaller  area of consolidation in the posterolateral left upper lobe centered on image 81, series 10. There small nodular components to this. There is a spiculated nodule in the subpleural left lower lobe measuring 10 x 8 cm, which was present previously. There are no other discrete lung nodules. There are no other areas of consolidation. There are stable areas of peripheral interstitial thickening/scarring as well as upper lobe predominant emphysema. Mild subsegmental atelectasis is noted in the right lower lobe. No pleural effusion.  No pneumothorax. Upper Abdomen: No acute findings. Musculoskeletal: No fracture or acute finding. No osteoblastic or osteolytic lesions. Review of the MIP images confirms the above findings. IMPRESSION: 1. Suboptimal exam limiting assessment for smaller pulmonary emboli. Allowing for this, there is no evidence of a pulmonary embolus. 2. Extensive left lower lobe consolidation consistent with pneumonia. 3. Findings of metastatic carcinoma noted on the prior chest, abdomen and pelvis CT are similar. Aortic Atherosclerosis (ICD10-I70.0) and Emphysema (ICD10-J43.9). Electronically Signed   By: Lajean Manes M.D.   On: 12/14/2017 19:56  Ir Removal [employer] W/o [place] Mod Sed  Result Date: 12/17/2017 INDICATION: 72 year old with pneumonia and bacteremia. Request for removal of the right jugular Port-A-Cath. Port-A-Cath was placed on 02/01/2017 by Dr. Vernard Gambles. EXAM: REMOVAL RIGHT IJ VEIN PORT-A-CATH MEDICATIONS: Ancef 2 g; The antibiotic was administered within an appropriate time interval prior to skin puncture. ANESTHESIA/SEDATION: Moderate (conscious) sedation was employed during this procedure. A total of Versed 1.0 mg and Fentanyl 50 mcg was administered intravenously. Moderate Sedation Time: 23 minutes. The patient's level of consciousness and vital signs were monitored continuously by radiology nursing throughout the procedure under my direct supervision. FLUOROSCOPY TIME:  None  COMPLICATIONS: None immediate. PROCEDURE: Informed written consent was obtained from the patient after a thorough discussion of the procedural risks, benefits and alternatives. All questions were addressed. Maximal Sterile Barrier Technique was utilized including caps, mask, sterile gowns, sterile gloves, sterile drape, hand hygiene and skin antiseptic. A timeout was performed prior to the initiation of the procedure. The right chest was prepped and draped in a sterile fashion. 1% Nesacaine was utilized for local anesthesia because of patient's lidocaine allergy. An incision was made over the previously healed surgical incision. Utilizing blunt dissection, the port catheter and reservoir were removed from the underlying subcutaneous tissue in their entirety. Port pocket was healthy looking. The pocket was irrigated with a copious amount of sterile normal saline. The pocket was closed with interrupted 3-0 Vicryl stitches. A 4-0 Vicryl running subcuticular stitch was utilized to approximate the skin. Dermabond was applied. IMPRESSION: Successful right IJ vein Port-A-Cath explant. Electronically Signed   By: Markus Daft M.D.   On: 12/17/2017 15:49     PERTINENT LAB RESULTS: CBC: Recent Labs    01/11/18 0329 01/12/18 0423  WBC 12.3* 11.7*  HGB 9.0* 9.1*  HCT 28.7* 28.8*  PLT 108* 111*   CMET CMP     Component Value Date/Time   NA 137 01/12/2018 0423   NA 139 12/02/2017 1055   K 3.6 01/12/2018 0423   K 4.6 12/02/2017 1055   CL 104 01/12/2018 0423   CO2 24 01/12/2018 0423   CO2 26 12/02/2017 1055   GLUCOSE 118 (H) 01/12/2018 0423   GLUCOSE 117 12/02/2017 1055   BUN 15 01/12/2018 0423   BUN 31.8 (H) 12/02/2017 1055   CREATININE 0.94 01/12/2018 0423   CREATININE 1.6 (H) 12/02/2017 1055   CALCIUM 7.9 (L) 01/12/2018 0423   CALCIUM 9.4 12/02/2017 1055   PROT 6.7 01/05/2018 1026   PROT 7.6 12/02/2017 1055   ALBUMIN 2.1 (L) 01/05/2018 1026   ALBUMIN 3.1 (L) 12/02/2017 1055   AST 63 (H)  01/05/2018 1026   AST 22 12/02/2017 1055   ALT 19 01/05/2018 1026   ALT 34 12/02/2017 1055   ALKPHOS 89 01/05/2018 1026   ALKPHOS 84 12/02/2017 1055   BILITOT 0.5 01/05/2018 1026   BILITOT 0.77 12/02/2017 1055   GFRNONAA >60 01/12/2018 0423   GFRAA >60 01/12/2018 0423    GFR Estimated Creatinine Clearance: 74.4 mL/min (by C-G formula based on SCr of 0.94 mg/dL). No results for input(s): LIPASE, AMYLASE in the last 72 hours. No results for input(s): CKTOTAL, CKMB, CKMBINDEX, TROPONINI in the last 72 hours. Invalid input(s): POCBNP No results for input(s): DDIMER in the last 72 hours. No results for input(s): HGBA1C in the last 72 hours. No results for input(s): CHOL, HDL, LDLCALC, TRIG, CHOLHDL, LDLDIRECT in the last 72 hours. No results for input(s): TSH, T4TOTAL, T3FREE, THYROIDAB in the last  72 hours.  Invalid input(s): FREET3 No results for input(s): VITAMINB12, FOLATE, FERRITIN, TIBC, IRON, RETICCTPCT in the last 72 hours. Coags: No results for input(s): INR in the last 72 hours.  Invalid input(s): PT Microbiology: Recent Results (from the past 240 hour(s))  Blood Culture (routine x 2)     Status: None   Collection Time: 01/05/18 10:26 AM  Result Value Ref Range Status   Specimen Description   Final    BLOOD LEFT ANTECUBITAL Performed at [facility] [address], [place], [place] [address]    Special Requests   Final    BOTTLES DRAWN AEROBIC AND ANAEROBIC Blood Culture adequate volume Performed at [facility] [address], [place], [place] [address]    Culture   Final    NO GROWTH 5 DAYS Performed at [facility], [place] [address] [place], [place] [address]    Report Status 01/10/2018 FINAL  Final  Blood Culture (routine x 2)     Status: None   Collection Time: 01/05/18 10:30 AM  Result Value Ref Range Status   Specimen Description   Final    BLOOD LEFT WRIST Performed at [facility]  [address] [place], [place] [address]    Special Requests   Final    BOTTLES DRAWN AEROBIC AND ANAEROBIC Blood Culture adequate volume Performed at [facility] [address] [place], [place] [address]    Culture   Final    NO GROWTH 5 DAYS Performed at [facility], [place] [address], [place], [place] [address]    Report Status 01/10/2018 FINAL  Final  MRSA PCR Screening     Status: None   Collection Time: 01/06/18 12:08 PM  Result Value Ref Range Status   MRSA by PCR NEGATIVE NEGATIVE Final    Comment:        The GeneXpert MRSA Assay (FDA approved for NASAL specimens only), is one component of a comprehensive MRSA colonization surveillance program. It is not intended to diagnose MRSA infection nor to guide or monitor treatment for MRSA infections. Performed at [facility], [place] [address], [place], [place] [address]     FURTHER DISCHARGE INSTRUCTIONS:  Get Medicines reviewed and adjusted: Please take all your medications with you for your next visit with your Primary MD  Laboratory/radiological data: Please request your Primary MD to go over all hospital tests and procedure/radiological results at the follow up, please ask your Primary MD to get all Hospital records sent to his/her office.  In some cases, they will be blood work, cultures and biopsy results pending at the time of your discharge. Please request that your primary care M.D. goes through all the records of your hospital data and follows up on these results.  Also Note the following: If you experience worsening of your admission symptoms, develop shortness of breath, life threatening emergency, suicidal or homicidal thoughts you must seek medical attention immediately by calling 911 or calling your MD immediately  if symptoms less severe.  You must read complete instructions/literature along with all the possible adverse reactions/side effects for all the Medicines you  take and that have been prescribed to you. Take any new Medicines after you have completely understood and accpet all the possible adverse reactions/side effects.   Do not drive when taking Pain medications or sleeping medications (Benzodaizepines)  Do not take more than prescribed Pain, Sleep and Anxiety Medications. It is not advisable to  combine anxiety,sleep and pain medications without talking with your primary care practitioner  Special Instructions: If you have smoked or chewed Tobacco  in the last 2 yrs please stop smoking, stop any regular Alcohol  and or any Recreational drug use.  Wear Seat belts while driving.  Please note: You were cared for by a hospitalist during your hospital stay. Once you are discharged, your primary care physician will handle any further medical issues. Please note that NO REFILLS for any discharge medications will be authorized once you are discharged, as it is imperative that you return to your primary care physician (or establish a relationship with a primary care physician if you do not have one) for your post hospital discharge needs so that they can reassess your need for medications and monitor your lab values.  Total Time spent coordinating discharge including counseling, education and face to face time equals  45 minutes.  SignedOren Binet 01/13/2018 9:12 AM

## 2018-01-13 NOTE — Progress Notes (Signed)
Patient given discharge instructions, and verbalized an understanding of all discharge instructions.  Patient agrees with discharge plan, and is being discharged in stable medical condition.  Patient given transportation via wheelchair. 

## 2018-01-13 NOTE — Progress Notes (Signed)
[facility] Therapy Treatment Record Phone [phone]   Radiation Therapy was administered to Nicholas Hale on: 01/13/2018  10:17 AM and was treatment # 5out of a planned course of 8treatments.  Radiation Treatment  1). Beam photons with 6-10 energy  2). Brachytherapy None  3). Stereotactic Radiosurgery None  4). Other Radiation None     Cyndal Kasson A Braeden Kennan, RT (T)

## 2018-01-14 ENCOUNTER — Ambulatory Visit
Admission: RE | Admit: 2018-01-14 | Discharge: 2018-01-14 | Disposition: A | Discharge status: Home / Self Care | Payer: Medicare Other | Source: Ambulatory Visit | Attending: Radiation Oncology | Admitting: Radiation Oncology

## 2018-01-14 DIAGNOSIS — I313 Pericardial effusion (noninflammatory): Secondary | ICD-10-CM | POA: Diagnosis not present

## 2018-01-14 DIAGNOSIS — D696 Thrombocytopenia, unspecified: Secondary | ICD-10-CM | POA: Diagnosis not present

## 2018-01-14 DIAGNOSIS — J189 Pneumonia, unspecified organism: Secondary | ICD-10-CM | POA: Diagnosis not present

## 2018-01-14 DIAGNOSIS — C3432 Malignant neoplasm of lower lobe, left bronchus or lung: Secondary | ICD-10-CM | POA: Diagnosis not present

## 2018-01-14 DIAGNOSIS — C770 Secondary and unspecified malignant neoplasm of lymph nodes of head, face and neck: Secondary | ICD-10-CM | POA: Diagnosis not present

## 2018-01-14 DIAGNOSIS — T827XXA Infection and inflammatory reaction due to other cardiac and vascular devices, implants and grafts, initial encounter: Secondary | ICD-10-CM | POA: Diagnosis not present

## 2018-01-17 ENCOUNTER — Ambulatory Visit
Admission: RE | Admit: 2018-01-17 | Discharge: 2018-01-17 | Disposition: A | Discharge status: Home / Self Care | Payer: Medicare Other | Source: Ambulatory Visit | Attending: Radiation Oncology | Admitting: Radiation Oncology

## 2018-01-17 DIAGNOSIS — D696 Thrombocytopenia, unspecified: Secondary | ICD-10-CM | POA: Diagnosis not present

## 2018-01-17 DIAGNOSIS — C3432 Malignant neoplasm of lower lobe, left bronchus or lung: Secondary | ICD-10-CM | POA: Diagnosis not present

## 2018-01-17 DIAGNOSIS — J189 Pneumonia, unspecified organism: Secondary | ICD-10-CM | POA: Diagnosis not present

## 2018-01-17 DIAGNOSIS — C770 Secondary and unspecified malignant neoplasm of lymph nodes of head, face and neck: Secondary | ICD-10-CM | POA: Diagnosis not present

## 2018-01-17 DIAGNOSIS — I313 Pericardial effusion (noninflammatory): Secondary | ICD-10-CM | POA: Diagnosis not present

## 2018-01-17 DIAGNOSIS — T827XXA Infection and inflammatory reaction due to other cardiac and vascular devices, implants and grafts, initial encounter: Secondary | ICD-10-CM | POA: Diagnosis not present

## 2018-01-18 ENCOUNTER — Ambulatory Visit
Admission: RE | Admit: 2018-01-18 | Discharge: 2018-01-18 | Disposition: A | Discharge status: Home / Self Care | Payer: Medicare Other | Source: Ambulatory Visit | Attending: Radiation Oncology | Admitting: Radiation Oncology

## 2018-01-18 ENCOUNTER — Encounter: Payer: Self-pay | Admitting: Radiation Oncology

## 2018-01-18 DIAGNOSIS — C3432 Malignant neoplasm of lower lobe, left bronchus or lung: Secondary | ICD-10-CM | POA: Diagnosis not present

## 2018-01-19 ENCOUNTER — Telehealth: Payer: Self-pay | Admitting: Medical Oncology

## 2018-01-19 ENCOUNTER — Emergency Department (HOSPITAL_COMMUNITY): Payer: Medicare Other

## 2018-01-19 ENCOUNTER — Other Ambulatory Visit: Payer: Self-pay

## 2018-01-19 ENCOUNTER — Encounter: Payer: Self-pay | Admitting: Medical Oncology

## 2018-01-19 ENCOUNTER — Inpatient Hospital Stay (HOSPITAL_COMMUNITY)
Admission: EM | Admit: 2018-01-19 | Discharge: 2018-01-22 | DRG: 871 | Disposition: A | Discharge status: Hospice | Payer: Medicare Other | Attending: Internal Medicine | Admitting: Internal Medicine

## 2018-01-19 DIAGNOSIS — Z79899 Other long term (current) drug therapy: Secondary | ICD-10-CM | POA: Diagnosis not present

## 2018-01-19 DIAGNOSIS — Z515 Encounter for palliative care: Secondary | ICD-10-CM | POA: Diagnosis present

## 2018-01-19 DIAGNOSIS — E876 Hypokalemia: Secondary | ICD-10-CM | POA: Diagnosis not present

## 2018-01-19 DIAGNOSIS — E785 Hyperlipidemia, unspecified: Secondary | ICD-10-CM | POA: Diagnosis present

## 2018-01-19 DIAGNOSIS — C3492 Malignant neoplasm of unspecified part of left bronchus or lung: Secondary | ICD-10-CM | POA: Diagnosis present

## 2018-01-19 DIAGNOSIS — J918 Pleural effusion in other conditions classified elsewhere: Secondary | ICD-10-CM | POA: Diagnosis present

## 2018-01-19 DIAGNOSIS — Z6825 Body mass index (BMI) 25.0-25.9, adult: Secondary | ICD-10-CM

## 2018-01-19 DIAGNOSIS — Z86711 Personal history of pulmonary embolism: Secondary | ICD-10-CM | POA: Diagnosis not present

## 2018-01-19 DIAGNOSIS — Z96643 Presence of artificial hip joint, bilateral: Secondary | ICD-10-CM | POA: Diagnosis present

## 2018-01-19 DIAGNOSIS — I313 Pericardial effusion (noninflammatory): Secondary | ICD-10-CM | POA: Diagnosis not present

## 2018-01-19 DIAGNOSIS — J910 Malignant pleural effusion: Secondary | ICD-10-CM | POA: Diagnosis not present

## 2018-01-19 DIAGNOSIS — C3490 Malignant neoplasm of unspecified part of unspecified bronchus or lung: Secondary | ICD-10-CM

## 2018-01-19 DIAGNOSIS — T827XXA Infection and inflammatory reaction due to other cardiac and vascular devices, implants and grafts, initial encounter: Secondary | ICD-10-CM | POA: Diagnosis not present

## 2018-01-19 DIAGNOSIS — J181 Lobar pneumonia, unspecified organism: Secondary | ICD-10-CM | POA: Diagnosis present

## 2018-01-19 DIAGNOSIS — R0602 Shortness of breath: Secondary | ICD-10-CM | POA: Diagnosis not present

## 2018-01-19 DIAGNOSIS — D649 Anemia, unspecified: Secondary | ICD-10-CM | POA: Diagnosis not present

## 2018-01-19 DIAGNOSIS — Z87891 Personal history of nicotine dependence: Secondary | ICD-10-CM

## 2018-01-19 DIAGNOSIS — D6869 Other thrombophilia: Secondary | ICD-10-CM | POA: Diagnosis not present

## 2018-01-19 DIAGNOSIS — Z86718 Personal history of other venous thrombosis and embolism: Secondary | ICD-10-CM | POA: Diagnosis not present

## 2018-01-19 DIAGNOSIS — J189 Pneumonia, unspecified organism: Secondary | ICD-10-CM | POA: Diagnosis not present

## 2018-01-19 DIAGNOSIS — B999 Unspecified infectious disease: Secondary | ICD-10-CM | POA: Diagnosis not present

## 2018-01-19 DIAGNOSIS — Z66 Do not resuscitate: Secondary | ICD-10-CM | POA: Diagnosis present

## 2018-01-19 DIAGNOSIS — I10 Essential (primary) hypertension: Secondary | ICD-10-CM | POA: Diagnosis present

## 2018-01-19 DIAGNOSIS — Z9889 Other specified postprocedural states: Secondary | ICD-10-CM

## 2018-01-19 DIAGNOSIS — R000 Tachycardia, unspecified: Secondary | ICD-10-CM | POA: Diagnosis not present

## 2018-01-19 DIAGNOSIS — D696 Thrombocytopenia, unspecified: Secondary | ICD-10-CM | POA: Diagnosis not present

## 2018-01-19 DIAGNOSIS — A0472 Enterocolitis due to Clostridium difficile, not specified as recurrent: Secondary | ICD-10-CM | POA: Diagnosis present

## 2018-01-19 DIAGNOSIS — Z7901 Long term (current) use of anticoagulants: Secondary | ICD-10-CM

## 2018-01-19 DIAGNOSIS — I714 Abdominal aortic aneurysm, without rupture: Secondary | ICD-10-CM | POA: Diagnosis present

## 2018-01-19 DIAGNOSIS — A419 Sepsis, unspecified organism: Principal | ICD-10-CM | POA: Diagnosis present

## 2018-01-19 DIAGNOSIS — E861 Hypovolemia: Secondary | ICD-10-CM | POA: Diagnosis present

## 2018-01-19 DIAGNOSIS — Z923 Personal history of irradiation: Secondary | ICD-10-CM

## 2018-01-19 DIAGNOSIS — J9601 Acute respiratory failure with hypoxia: Secondary | ICD-10-CM | POA: Diagnosis not present

## 2018-01-19 DIAGNOSIS — E440 Moderate protein-calorie malnutrition: Secondary | ICD-10-CM | POA: Diagnosis present

## 2018-01-19 DIAGNOSIS — R531 Weakness: Secondary | ICD-10-CM | POA: Diagnosis not present

## 2018-01-19 DIAGNOSIS — R404 Transient alteration of awareness: Secondary | ICD-10-CM | POA: Diagnosis not present

## 2018-01-19 DIAGNOSIS — C770 Secondary and unspecified malignant neoplasm of lymph nodes of head, face and neck: Secondary | ICD-10-CM | POA: Diagnosis not present

## 2018-01-19 DIAGNOSIS — Z981 Arthrodesis status: Secondary | ICD-10-CM | POA: Diagnosis not present

## 2018-01-19 DIAGNOSIS — J90 Pleural effusion, not elsewhere classified: Secondary | ICD-10-CM

## 2018-01-19 DIAGNOSIS — R7989 Other specified abnormal findings of blood chemistry: Secondary | ICD-10-CM | POA: Diagnosis not present

## 2018-01-19 DIAGNOSIS — D539 Nutritional anemia, unspecified: Secondary | ICD-10-CM | POA: Diagnosis present

## 2018-01-19 DIAGNOSIS — J449 Chronic obstructive pulmonary disease, unspecified: Secondary | ICD-10-CM | POA: Diagnosis not present

## 2018-01-19 DIAGNOSIS — C3432 Malignant neoplasm of lower lobe, left bronchus or lung: Secondary | ICD-10-CM | POA: Diagnosis not present

## 2018-01-19 DIAGNOSIS — R079 Chest pain, unspecified: Secondary | ICD-10-CM | POA: Diagnosis not present

## 2018-01-19 LAB — CBC WITH DIFFERENTIAL/PLATELET
BASOS PCT: 0 %
Basophils Absolute: 0 10*3/uL (ref 0.0–0.1)
EOS ABS: 0 10*3/uL (ref 0.0–0.7)
Eosinophils Relative: 0 %
HCT: 28.1 % — ABNORMAL LOW (ref 39.0–52.0)
HEMOGLOBIN: 8.6 g/dL — AB (ref 13.0–17.0)
LYMPHS ABS: 0.2 10*3/uL — AB (ref 0.7–4.0)
LYMPHS PCT: 1 %
MCH: 33.1 pg (ref 26.0–34.0)
MCHC: 30.6 g/dL (ref 30.0–36.0)
MCV: 108.1 fL — ABNORMAL HIGH (ref 78.0–100.0)
MONO ABS: 0.5 10*3/uL (ref 0.1–1.0)
Monocytes Relative: 3 %
NEUTROS ABS: 14.8 10*3/uL — AB (ref 1.7–7.7)
Neutrophils Relative %: 96 %
Platelets: 105 10*3/uL — ABNORMAL LOW (ref 150–400)
RBC: 2.6 MIL/uL — ABNORMAL LOW (ref 4.22–5.81)
RDW: 21 % — ABNORMAL HIGH (ref 11.5–15.5)
WBC: 15.5 10*3/uL — ABNORMAL HIGH (ref 4.0–10.5)

## 2018-01-19 LAB — COMPREHENSIVE METABOLIC PANEL
ALBUMIN: 1.8 g/dL — AB (ref 3.5–5.0)
ALT: 48 U/L (ref 17–63)
AST: 105 U/L — AB (ref 15–41)
Alkaline Phosphatase: 139 U/L — ABNORMAL HIGH (ref 38–126)
Anion gap: 10 (ref 5–15)
BILIRUBIN TOTAL: 0.5 mg/dL (ref 0.3–1.2)
BUN: 17 mg/dL (ref 6–20)
CALCIUM: 7.4 mg/dL — AB (ref 8.9–10.3)
CO2: 20 mmol/L — ABNORMAL LOW (ref 22–32)
CREATININE: 0.93 mg/dL (ref 0.61–1.24)
Chloride: 112 mmol/L — ABNORMAL HIGH (ref 101–111)
GFR calc Af Amer: >60 mL/min (ref >60)
GFR calc non Af Amer: >60 mL/min (ref >60)
GLUCOSE: 135 mg/dL — AB (ref 65–99)
Potassium: 2.8 mmol/L — ABNORMAL LOW (ref 3.5–5.1)
Sodium: 142 mmol/L (ref 135–145)
TOTAL PROTEIN: 5.6 g/dL — AB (ref 6.5–8.1)

## 2018-01-19 LAB — I-STAT CG4 LACTIC ACID, ED: Lactic Acid, Venous: 1.51 mmol/L (ref 0.5–1.9)

## 2018-01-19 LAB — I-STAT TROPONIN, ED: TROPONIN I, POC: 0 ng/mL (ref 0.00–0.08)

## 2018-01-19 LAB — PROTIME-INR
INR: 1.44
PROTHROMBIN TIME: 17.4 s — AB (ref 11.4–15.2)

## 2018-01-19 LAB — BRAIN NATRIURETIC PEPTIDE: B Natriuretic Peptide: 160.7 pg/mL — ABNORMAL HIGH (ref 0.0–100.0)

## 2018-01-19 MED ORDER — SODIUM CHLORIDE 0.9 % IV SOLN
INTRAVENOUS | Status: DC
  Administered 2018-01-20 – 2018-01-21 (×3): via INTRAVENOUS

## 2018-01-19 MED ORDER — SODIUM CHLORIDE 0.9 % IJ SOLN
INTRAMUSCULAR | Status: AC
  Filled 2018-01-19: qty 50

## 2018-01-19 MED ORDER — METOPROLOL TARTRATE 5 MG/5ML IV SOLN
2.5000 mg | Freq: Four times a day (QID) | INTRAVENOUS | Status: DC | PRN
  Administered 2018-01-19 – 2018-01-20 (×2): 2.5 mg via INTRAVENOUS
  Filled 2018-01-19 (×2): qty 5

## 2018-01-19 MED ORDER — POTASSIUM CHLORIDE 10 MEQ/100ML IV SOLN
10.0000 meq | INTRAVENOUS | Status: AC
  Administered 2018-01-19 (×2): 10 meq via INTRAVENOUS
  Filled 2018-01-19 (×2): qty 100

## 2018-01-19 MED ORDER — SODIUM CHLORIDE 0.9 % IV BOLUS (SEPSIS)
500.0000 mL | Freq: Once | INTRAVENOUS | Status: AC
  Administered 2018-01-19: 500 mL via INTRAVENOUS

## 2018-01-19 MED ORDER — POTASSIUM CHLORIDE 10 MEQ/100ML IV SOLN
10.0000 meq | INTRAVENOUS | Status: AC
  Administered 2018-01-20 (×2): 10 meq via INTRAVENOUS
  Filled 2018-01-19 (×2): qty 100

## 2018-01-19 MED ORDER — MAGNESIUM SULFATE 2 GM/50ML IV SOLN
2.0000 g | Freq: Once | INTRAVENOUS | Status: AC
  Administered 2018-01-19: 2 g via INTRAVENOUS
  Filled 2018-01-19: qty 50

## 2018-01-19 MED ORDER — POTASSIUM CHLORIDE CRYS ER 20 MEQ PO TBCR
20.0000 meq | EXTENDED_RELEASE_TABLET | Freq: Once | ORAL | Status: AC
  Administered 2018-01-20: 20 meq via ORAL
  Filled 2018-01-19: qty 1

## 2018-01-19 MED ORDER — MORPHINE SULFATE (PF) 2 MG/ML IV SOLN
1.0000 mg | Freq: Once | INTRAVENOUS | Status: AC
  Administered 2018-01-19: 1 mg via INTRAVENOUS
  Filled 2018-01-19: qty 1

## 2018-01-19 MED ORDER — VANCOMYCIN HCL 10 G IV SOLR
1500.0000 mg | Freq: Once | INTRAVENOUS | Status: AC
  Administered 2018-01-19: 1500 mg via INTRAVENOUS
  Filled 2018-01-19: qty 1500

## 2018-01-19 MED ORDER — SODIUM CHLORIDE 0.9 % IV SOLN
2.0000 g | Freq: Once | INTRAVENOUS | Status: AC
  Administered 2018-01-19: 2 g via INTRAVENOUS
  Filled 2018-01-19: qty 2

## 2018-01-19 MED ORDER — SODIUM CHLORIDE 0.9 % IV BOLUS (SEPSIS)
1000.0000 mL | Freq: Once | INTRAVENOUS | Status: AC
  Administered 2018-01-19: 1000 mL via INTRAVENOUS

## 2018-01-19 MED ORDER — PIPERACILLIN-TAZOBACTAM 3.375 G IVPB
3.3750 g | Freq: Three times a day (TID) | INTRAVENOUS | Status: DC
  Administered 2018-01-20 – 2018-01-22 (×7): 3.375 g via INTRAVENOUS
  Filled 2018-01-19 (×6): qty 50

## 2018-01-19 MED ORDER — MORPHINE SULFATE (PF) 4 MG/ML IV SOLN
1.0000 mg | INTRAVENOUS | Status: DC | PRN
Start: 2018-01-19 — End: 2018-01-22
  Administered 2018-01-20 – 2018-01-21 (×2): 1 mg via INTRAVENOUS
  Filled 2018-01-19 (×2): qty 1

## 2018-01-19 MED ORDER — IOPAMIDOL (ISOVUE-370) INJECTION 76%
INTRAVENOUS | Status: AC
  Administered 2018-01-19: 100 mL via INTRAVENOUS
  Filled 2018-01-19: qty 100

## 2018-01-19 NOTE — H&P (Signed)
History and Physical    Nicholas Hale:[id number] DOB: 03-30-1946 DOA: 01/19/2018  PCP: [name], MD  Patient coming from: Home   I have personally briefly reviewed patient's old medical records in [place]  Chief Complaint: Weakness, SOB   HPI: Nicholas Hale is a 72 y.o. 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 Per wife patient has not been able to stand, or walk with his walker. His HR has been elevated in the 130--140. He has been having worsening SOB over last week. He finished his last radiation treatment yesterday. He report  watery and loose stool over last 5 days. He has had 2 watery large BM today.   He is SOB and chest pain. He is tired, he wants to be comfortable.   ED Course: Patient notice to be tachycardic, tachypnea, K at 2.8, CO2 at 20, albumin 1.8, BNP 160, lactic acid 1.5, WBC 15, hb 8.6, platelet 105, INR 1.4, CT angio; Negative for acute pulmonary embolus. Moderate to large left pleural effusion, increased compared to prior. Continued dense left lower lobe pneumonia with new central low density in the consolidation possibly due to necrosis. Left lower lobe pneumonia is contiguous with ill-defined, infiltrative left hilar mass with narrowing of the left lower lobe bronchi. Multiple nodules or nodular foci of disease visualized within the lingula and anterior left lung base. Increased mediastinal adenopathy since the most recent chest CT. New right pleural effusion, small Emphysema  Review of Systems: As per HPI otherwise 10 point review of systems negative.    Past Medical History:  Diagnosis Date  .  Abdominal aortic aneurysm ([place])   . Arthritis    "hips; lower spine" (05/24/2014)  . Chemotherapy induced neutropenia ([place]) 03/31/2017  . Chronic fatigue 10/05/2016  . Encounter for antineoplastic chemotherapy 02/17/2017  . History of radiation therapy 10/05/17-10/25/17   left lung/chest 35 Gy in 14 fractions  . Hyperlipidemia   . Hypertension   . Lung cancer ([place]) 09/25/2016  . PE (pulmonary embolism) 9/15  . Pericardial effusion 09/20/2016   Malignant effusion s/p pericardial drain  . Personal history of DVT (deep vein thrombosis) 9/15    Past Surgical History:  Procedure Laterality Date  . ANTERIOR FUSION CERVICAL SPINE  ~ 2005  . APPENDECTOMY  1978  . CARDIAC CATHETERIZATION N/A 09/21/2016   Procedure: Pericardiocentesis;  Surgeon: Jettie Booze, MD;  Location: [facility];  Service: Cardiovascular;  Laterality: N/A;  . COLONOSCOPY    . HEMIARTHROPLASTY SHOULDER FRACTURE Left 12/2008  . INGUINAL HERNIA REPAIR Bilateral 1992  . IR GENERIC HISTORICAL  02/01/2017   IR FLUORO GUIDE PORT INSERTION RIGHT 02/01/2017 Arne Cleveland, MD [facility]  . IR GENERIC HISTORICAL  02/01/2017   IR US GUIDE VASC ACCESS RIGHT 02/01/2017 Arne Cleveland, MD [facility]  . IR REMOVAL TUN ACCESS W/ PORT W/O FL MOD SED  12/17/2017  . JOINT REPLACEMENT    . PERICARDIAL FLUID DRAINAGE  09/21/2016  . TEE WITHOUT CARDIOVERSION N/A 12/21/2017   Procedure: TRANSESOPHAGEAL ECHOCARDIOGRAM (TEE);  Surgeon: Josue Hector, MD;  Location: [facility];  Service: Cardiovascular;  Laterality: N/A;  . TOTAL HIP ARTHROPLASTY  Left 05/23/2014   Procedure: TOTAL HIP ARTHROPLASTY;  Surgeon: Kerin Salen, MD;  Location: [facility];  Service: Orthopedics;  Laterality: Left;  . TOTAL HIP ARTHROPLASTY Right 05/15/2015   Procedure: TOTAL HIP ARTHROPLASTY;  Surgeon: Frederik Pear, MD;  Location: [facility];  Service: Orthopedics;  Laterality: Right;     reports that he quit smoking about 9 years ago. His smoking use included  cigarettes. He has a 20.00 pack-year smoking history. he has never used smokeless tobacco. He reports that he does not drink alcohol or use drugs.  Allergies  Allergen Reactions  . Lidocaine Swelling    "Hard white blisters" formed on tongue, swollen mouth    Family History  Problem Relation Age of Onset  . Clotting disorder Mother   . Diabetes Mellitus I Mother   . Prostate cancer Father      Prior to Admission medications   Medication Sig Start Date End Date Taking? Authorizing Provider  albuterol (PROVENTIL HFA;VENTOLIN HFA) 108 (90 Base) MCG/ACT inhaler Inhale 2 puffs into the lungs every 4 (four) hours as needed for wheezing or shortness of breath. 07/27/17  Yes Byrum, Rose Fillers, MD  atorvastatin (LIPITOR) 20 MG tablet TAKE 1 TABLET BY MOUTH EVERY DAY AT 6PM 10/12/17  Yes End, Harrell Gave, MD  ipratropium (ATROVENT) 0.02 % nebulizer solution Take 2.5 mLs (0.5 mg total) by nebulization 3 (three) times daily. 01/13/18  Yes Ghimire, Henreitta Leber, MD  levalbuterol Penne Lash) 0.63 MG/3ML nebulizer solution Take 3 mLs (0.63 mg total) by nebulization 3 (three) times daily. 01/13/18  Yes Ghimire, Henreitta Leber, MD  lidocaine-prilocaine (EMLA) cream Apply 1 application topically daily as needed (on port site).    Yes [provider]  metoprolol tartrate (LOPRESSOR) 25 MG tablet Take 0.5 tablets (12.5 mg total) by mouth 2 (two) times daily. 01/13/18 01/13/19 Yes Ghimire, Henreitta Leber, MD  mirtazapine (REMERON SOL-TAB) 15 MG disintegrating tablet Take 1 tablet (15 mg total) by mouth at bedtime. 01/13/18  Yes Ghimire, Henreitta Leber, MD  oxyCODONE (ROXICODONE) 5 MG/5ML solution Take 5 mLs (5 mg total) by mouth every 4 (four) hours as needed for severe pain. 12/02/17  Yes Tanner, Lyndon Code., PA-C  prochlorperazine (COMPAZINE) 10 MG tablet Take 1 tablet (10 mg total) by mouth every 6 (six) hours as needed for nausea or vomiting. 11/24/17  Yes [name], MD  sucralfate (CARAFATE) 1 g tablet Take 1 tablet (1 g  total) by mouth 4 (four) times daily -  with meals and at bedtime. Crush and dissolve in 10 cc of water prior to swallowing Patient taking differently: Take 1 g by mouth 4 (four) times daily -  before meals and at bedtime. Crush and dissolve in 10 cc of water prior to swallowing 10/25/17  Yes Gery Pray, MD  XARELTO 20 MG TABS tablet TAKE 1 TABLET BY MOUTH EVERY DAY 11/17/17  Yes Collene Gobble, MD  amoxicillin-clavulanate (AUGMENTIN) 875-125 MG tablet Take 1 tablet by mouth 2 (two) times daily. Patient not taking: Reported on 01/19/2018 01/13/18   Jonetta Osgood, MD  dexamethasone (DECADRON) 4 MG tablet 4 mg by mouth twice a day the day before, day of and day after the chemotherapy every 3 weeks. Patient not taking: Reported on 01/19/2018 02/17/17   [name], MD  Dextromethorphan-Guaifenesin [facility] DM PO) Take 20 mLs by mouth 2 (two) times daily as needed (chest congestion).     [provider]  diphenhydrAMINE (BENADRYL) 25 MG tablet Take 1 tablet (25 mg total)  by mouth at bedtime as needed for sleep. Patient not taking: Reported on 01/05/2018 12/24/17   Florencia Reasons, MD  feeding supplement, ENSURE ENLIVE, (ENSURE ENLIVE) LIQD Take 237 mLs by mouth 2 (two) times daily between meals. 01/13/18   Ghimire, Henreitta Leber, MD  magic mouthwash SOLN Take 5 mLs by mouth 4 (four) times daily. Prednisone 80 mg , Nystatin 30 ml, Benadryl 12.5 mg/5 ml. Mix to 240 ml, 1:1:1 concentration. Patient not taking: Reported on 01/19/2018 11/24/17   [name], MD  polyethylene glycol [facility] / Floria Raveling) packet Take 17 g by mouth daily. Patient not taking: Reported on 01/19/2018 12/25/17   Florencia Reasons, MD  senna-docusate (SENOKOT-S) 8.6-50 MG tablet Take 1 tablet by mouth at bedtime. Patient not taking: Reported on 01/19/2018 12/24/17   Florencia Reasons, MD  sodium chloride (OCEAN) 0.65 % SOLN nasal spray Place 2 sprays into both nostrils as needed for congestion.    [provider]    Physical  Exam: Vitals:   01/19/18 2000 01/19/18 2100 01/19/18 2200 01/19/18 2231  BP: 131/84 120/64 109/74   Pulse: (!) 126 (!) 124 (!) 134   Resp: (!) 32 (!) 34 (!) 29   Temp:    98.8 F (37.1 C)  TempSrc:    Oral  SpO2: 97% 97% 96%   Weight:      Height:        Constitutional: Tachypnea, tachycardic.  Vitals:   01/19/18 2000 01/19/18 2100 01/19/18 2200 01/19/18 2231  BP: 131/84 120/64 109/74   Pulse: (!) 126 (!) 124 (!) 134   Resp: (!) 32 (!) 34 (!) 29   Temp:    98.8 F (37.1 C)  TempSrc:    Oral  SpO2: 97% 97% 96%   Weight:      Height:       Eyes: PERRL, lids and conjunctivae normal ENMT: Mucous membranes dry  Neck: normal, supple, no masses, no thyromegaly Respiratory: tachypnea, bilateral ronchus. Decrease sound on the left  Cardiovascular: Regular rate and rhythm tachycardic, no murmurs / rubs / gallops. No extremity edema. 2+ pedal pulses. No carotid bruits.  Abdomen: no tenderness, no masses palpated. No hepatosplenomegaly. Bowel sounds positive.  Musculoskeletal: no clubbing / cyanosis. No joint deformity upper and lower extremities. Good ROM, no contractures. Normal muscle tone.  Skin: no rashes, lesions, ulcers. No induration Neurologic: alert, answer questions.     Labs on Admission: I have personally reviewed following labs and imaging studies  CBC: Recent Labs  Lab 01/19/18 1814  WBC 15.5*  NEUTROABS 14.8*  HGB 8.6*  HCT 28.1*  MCV 108.1*  PLT 532*   Basic Metabolic Panel: Recent Labs  Lab 01/19/18 1814  NA 142  K 2.8*  CL 112*  CO2 20*  GLUCOSE 135*  BUN 17  CREATININE 0.93  CALCIUM 7.4*   GFR: Estimated Creatinine Clearance: 77.6 mL/min (by C-G formula based on SCr of 0.93 mg/dL). Liver Function Tests: Recent Labs  Lab 01/19/18 1814  AST 105*  ALT 48  ALKPHOS 139*  BILITOT 0.5  PROT 5.6*  ALBUMIN 1.8*   No results for input(s): LIPASE, AMYLASE in the last 168 hours. No results for input(s): AMMONIA in the last 168  hours. Coagulation Profile: Recent Labs  Lab 01/19/18 1814  INR 1.44   Cardiac Enzymes: No results for input(s): CKTOTAL, CKMB, CKMBINDEX, TROPONINI in the last 168 hours. BNP (last 3 results) No results for input(s): PROBNP in the last 8760 hours. HbA1C: No results for input(s): HGBA1C  in the last 72 hours. CBG: No results for input(s): GLUCAP in the last 168 hours. Lipid Profile: No results for input(s): CHOL, HDL, LDLCALC, TRIG, CHOLHDL, LDLDIRECT in the last 72 hours. Thyroid Function Tests: No results for input(s): TSH, T4TOTAL, FREET4, T3FREE, THYROIDAB in the last 72 hours. Anemia Panel: No results for input(s): VITAMINB12, FOLATE, FERRITIN, TIBC, IRON, RETICCTPCT in the last 72 hours. Urine analysis:    Component Value Date/Time   COLORURINE YELLOW 01/05/2018 1640   APPEARANCEUR CLEAR 01/05/2018 1640   LABSPEC 1.043 (H) 01/05/2018 1640   PHURINE 5.0 01/05/2018 1640   GLUCOSEU NEGATIVE 01/05/2018 1640   HGBUR SMALL (A) 01/05/2018 1640   BILIRUBINUR NEGATIVE 01/05/2018 1640   KETONESUR NEGATIVE 01/05/2018 1640   PROTEINUR 100 (A) 01/05/2018 1640   UROBILINOGEN 0.2 05/06/2015 0935   NITRITE NEGATIVE 01/05/2018 1640   LEUKOCYTESUR NEGATIVE 01/05/2018 1640    Radiological Exams on Admission: Dg Chest 2 View  Result Date: 01/19/2018 CLINICAL DATA:  Dyspnea and left-sided chest pain x3 days EXAM: CHEST  2 VIEW COMPARISON:  01/05/2018 CT and CXR FINDINGS: Right-sided PICC line tip terminates in the distal SVC. Heart size is normal. No aortic aneurysm is noted. Slight interval increase in moderate left pleural effusion. A small right pleural effusion is now noted as well. There is pulmonary vascular congestion. IMPRESSION: Slight interval increase in bilateral pleural effusions, left greater than right. Persistent left basilar consolidation. Stable PICC line position. Electronically Signed   By: Ashley Royalty M.D.   On: 01/19/2018 18:04   Ct Angio Chest Pe W And/or Wo  Contrast  Result Date: 01/19/2018 CLINICAL DATA:  Shortness of breath with left-sided chest pain history of lung cancer EXAM: CT ANGIOGRAPHY CHEST WITH CONTRAST TECHNIQUE: Multidetector CT imaging of the chest was performed using the standard protocol during bolus administration of intravenous contrast. Multiplanar CT image reconstructions and MIPs were obtained to evaluate the vascular anatomy. CONTRAST:  162mL ISOVUE-370 IOPAMIDOL (ISOVUE-370) INJECTION 76% COMPARISON:  Radiograph 01/19/2018, CT chest 01/05/2018, 12/14/2017, 11/24/2017, 09/20/2017, PET CT 10/05/2016 FINDINGS: Cardiovascular: Satisfactory opacification of the pulmonary arteries to the segmental level. No evidence of pulmonary embolism. Nonaneurysmal aorta. Mild aortic atherosclerosis. Vascular catheter tip in the distal SVC. Minimal coronary vascular calcification. Normal heart size. No large pericardial effusion Mediastinum/Nodes: Midline trachea. No thyroid mass. Probable left lobectomy. Increased upper mediastinal adenopathy. A prevascular lymph node measures 19 mm, compared with 17 mm previously. Lymph node adjacent to the arch measures 11 mm compared with 9 mm previously. Esophagus within normal limits. Lungs/Pleura: Moderate emphysema. Small right pleural effusion, new. Moderate to large left pleural effusion, increased. Left hilar infiltrative soft tissue mass/density is stable to minimally decreased. Left infrahilar soft tissue mass is contiguous with dense consolidation in the left lower lobe. A central focal area of low attenuation within the consolidated left lower lobe may represent necrosis. Residual narrowing of left lower lobe bronchi by soft tissue mass/consolidation though with some improvement proximally. Some clearing of inflammatory infiltrates in the lingula, however multifocal nodular densities or nodules are evident within the lingula. A masslike density is present in the anterior left lung base but is contiguous with left  lower lobe consolidation. Upper Abdomen: No acute abnormality. Musculoskeletal: Degenerative changes of the spine. No acute or suspicious bone lesion. Surgical hardware partially seen in the proximal left humerus. Review of the MIP images confirms the above findings. IMPRESSION: 1. Negative for acute pulmonary embolus. 2. Moderate to large left pleural effusion, increased compared to prior. Continued dense  left lower lobe pneumonia with new central low density in the consolidation possibly due to necrosis. Left lower lobe pneumonia is contiguous with ill-defined, infiltrative left hilar mass with narrowing of the left lower lobe bronchi. Multiple nodules or nodular foci of disease visualized within the lingula and anterior left lung base. 3. Increased mediastinal adenopathy since the most recent chest CT. 4. New right pleural effusion, small 5. Emphysema Aortic Atherosclerosis (ICD10-I70.0) and Emphysema (ICD10-J43.9). Electronically Signed   By: Donavan Foil M.D.   On: 01/19/2018 21:02    EKG: Sinus tachycardia.   Assessment/Plan Active Problems:   Hypertension   Hyperlipidemia   Adenocarcinoma of left lung, stage 4 (HCC)   Thrombocytopenia (HCC)   Pneumonia   Malnutrition of moderate degree   Postobstructive pneumonia   Elevated TSH   Tachycardia  1-Acute hypoxic respiratory failure;  In setting of Lung cancer, Post obstructive PNA, pleural effusion.  Admit to step down unit. IV antibiotics, nebulizer.  Pulmonologist consulted, for evaluation of thoracentesis.  IV morphine for Chest pain.    2-Tachycardia; multifactorial, related to hypovolemia, hypoxemia.  IV fluids. Replete IV KCL, IV magnesium.  Check TSH.  IV metoprolol IV PRN.  Hb at 8.6-- baseline 9.1.   3-Stage IV non small  lung cancer;  Added Dr [name] to rounding team.  Finished radiation treatment 2-19. Palliative care consult for goals of care.   4-Hypokalemia; Replete IV kcl total 4 runs. One time dose of oral  supplementation.  IV magnesium supplementation.  Check Mg in am.   5-Diarrhea; high risk for C diff.  Check for C diff.   6-Transaminases; follow trend.   7-Thrombocytopenia; chronic stable.   8-History of DVT and PE; continue with xarelto.    DVT prophylaxis: on xarelto.  Code Status: DNR Family Communication: Wife at bedside.  Disposition Plan: To be determine.  Consults called:  CCM Admission status: Step down.    Elmarie Shiley MD [facility] Pager [phone]  If 7PM-7AM, please contact night-coverage [email or web address] Password [facility]  01/19/2018, 11:06 PM

## 2018-01-19 NOTE — ED Provider Notes (Addendum)
[name] DEPT Provider Note  CSN: [id number] Arrival date & time: 01/19/18  1708   History   Chief Complaint Chief Complaint  Patient presents with  . Shortness of Breath    HPI Nicholas Hale is a 72 y.o. male.  HPI Patient presents to the emergency room for evaluation of shortness of breath and weakness.  Patient has a history of lung cancer.  He also has a history of PE and a pericardial effusion.  Patient states he has had trouble with shortness of breath and weakness for a while now but his symptoms became much worse today.  Patient is feeling very weak and short of breath when he tries to walk around.  He said some coughing fevers.  He does not have any chest pain.  No abdominal pain.  No leg swelling. Past Medical History:  Diagnosis Date  . Abdominal aortic aneurysm ([place])   . Arthritis    "hips; lower spine" (05/24/2014)  . Chemotherapy induced neutropenia ([place]) 03/31/2017  . Chronic fatigue 10/05/2016  . Encounter for antineoplastic chemotherapy 02/17/2017  . History of radiation therapy 10/05/17-10/25/17   left lung/chest 35 Gy in 14 fractions  . Hyperlipidemia   . Hypertension   . Lung cancer ([place]) 09/25/2016  . PE (pulmonary embolism) 9/15  . Pericardial effusion 09/20/2016   Malignant effusion s/p pericardial drain  . Personal history of DVT (deep vein thrombosis) 9/15    Patient Active Problem List   Diagnosis Date Noted  . Encounter for palliative care   . Hypokalemia   . Elevated TSH   . Postobstructive pneumonia 01/05/2018  . Sepsis due to pneumonia ([place]) 01/05/2018  . 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 Staphylococcus aureus bacteremia   . Bacteremia   . Malnutrition of moderate degree 12/17/2017  . Pneumonia 12/14/2017  . Leukocytosis 12/14/2017  . Anemia 12/14/2017  . Hyponatremia 12/14/2017  . HCAP (healthcare-associated pneumonia)   . Influenza vaccination administered at current visit 09/02/2017  . Gingival  bleeding 08/21/2017  . Thrombocytopenia ([place]) 08/21/2017  . Dyspnea on exertion 04/05/2017  . Chemotherapy induced neutropenia ([place]) 03/31/2017  . Port catheter in place 03/03/2017  . Encounter for antineoplastic chemotherapy 02/17/2017  . Adenocarcinoma of left lung, stage 4 ([place]) 10/05/2016  . Goals of care, counseling/discussion 10/05/2016  . Chronic fatigue 10/05/2016  . Lung cancer ([place]) 09/25/2016  . Acute deep vein thrombosis (DVT) of proximal vein of both lower extremities (HCC)   . Cardiac tamponade   . Elevated troponin 09/20/2016  . 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 Ventricular tachycardia ([place]) 09/19/2016  . Renal cyst, right 09/19/2016  . Liver lesion 09/19/2016  . Nonsustained ventricular tachycardia ([place])   . History of tobacco abuse 01/28/2016  . Primary osteoarthritis of right hip 05/10/2015  . 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 Arthritis of left hip 05/23/2014  . Hypertension 05/15/2014  . Hyperlipidemia 05/15/2014    Past Surgical History:  Procedure Laterality Date  . ANTERIOR FUSION CERVICAL SPINE  ~ 2005  . APPENDECTOMY  1978  . CARDIAC CATHETERIZATION N/A 09/21/2016   Procedure: Pericardiocentesis;  Surgeon: Jettie Booze, MD;  Location: [facility];  Service: Cardiovascular;  Laterality: N/A;  . COLONOSCOPY    . HEMIARTHROPLASTY SHOULDER FRACTURE Left 12/2008  . INGUINAL HERNIA REPAIR Bilateral 1992  . IR GENERIC HISTORICAL  02/01/2017   IR FLUORO GUIDE PORT INSERTION RIGHT 02/01/2017 Arne Cleveland, MD [facility]  . IR GENERIC HISTORICAL  02/01/2017   IR US GUIDE VASC ACCESS RIGHT 02/01/2017 Arne Cleveland, MD [facility]  . IR REMOVAL TUN ACCESS W/ PORT W/O FL MOD SED  12/17/2017  . JOINT REPLACEMENT    . PERICARDIAL FLUID DRAINAGE  09/21/2016  . TEE WITHOUT CARDIOVERSION N/A 12/21/2017   Procedure: TRANSESOPHAGEAL ECHOCARDIOGRAM (TEE);  Surgeon: Josue Hector, MD;  Location: [facility] ENDOSCOPY;  Service:  Cardiovascular;  Laterality: N/A;  . TOTAL HIP ARTHROPLASTY Left 05/23/2014   Procedure: TOTAL HIP ARTHROPLASTY;  Surgeon: Kerin [place]en, MD;  Location: [facility];  Service: Orthopedics;  Laterality: Left;  . TOTAL HIP ARTHROPLASTY Right 05/15/2015   Procedure: TOTAL HIP ARTHROPLASTY;  Surgeon: Frederik Pear, MD;  Location: [facility];  Service: Orthopedics;  Laterality: Right;       Home Medications    Prior to Admission medications   Medication Sig Start Date End Date Taking? Authorizing Provider  albuterol (PROVENTIL HFA;VENTOLIN HFA) 108 (90 Base) MCG/ACT inhaler Inhale 2 puffs into the lungs every 4 (four) hours as needed for wheezing or shortness of breath. 07/27/17  Yes Byrum, Rose Fillers, MD  atorvastatin (LIPITOR) 20 MG tablet TAKE 1 TABLET BY MOUTH EVERY DAY AT 6PM 10/12/17  Yes End, Harrell Gave, MD  ipratropium (ATROVENT) 0.02 % nebulizer solution Take 2.5 mLs (0.5 mg total) by nebulization 3 (three) times daily. 01/13/18  Yes Ghimire, Henreitta Leber, MD  levalbuterol Penne Lash) 0.63 MG/3ML nebulizer solution Take 3 mLs (0.63 mg total) by nebulization 3 (three) times daily. 01/13/18  Yes Ghimire, Henreitta Leber, MD  lidocaine-prilocaine (EMLA) cream Apply 1 application topically daily as needed (on port site).    Yes [provider]  metoprolol tartrate (LOPRESSOR) 25 MG tablet Take 0.5 tablets (12.5 mg total) by mouth 2 (two) times daily. 01/13/18 01/13/19 Yes Ghimire, Henreitta Leber, MD  mirtazapine (REMERON SOL-TAB) 15 MG disintegrating tablet Take 1 tablet (15 mg total) by mouth at bedtime. 01/13/18  Yes Ghimire, Henreitta Leber, MD  oxyCODONE (ROXICODONE) 5 MG/5ML solution Take 5 mLs (5 mg total) by mouth every 4 (four) hours as needed for severe pain. 12/02/17  Yes Tanner, Lyndon Code., PA-C  prochlorperazine (COMPAZINE) 10 MG tablet Take 1 tablet (10 mg total) by mouth every 6 (six) hours as needed for nausea or vomiting. 11/24/17  Yes [name], MD  sucralfate (CARAFATE) 1 g tablet Take 1 tablet (1 g total) by  mouth 4 (four) times daily -  with meals and at bedtime. Crush and dissolve in 10 cc of water prior to swallowing Patient taking differently: Take 1 g by mouth 4 (four) times daily -  before meals and at bedtime. Crush and dissolve in 10 cc of water prior to swallowing 10/25/17  Yes Gery Pray, MD  XARELTO 20 MG TABS tablet TAKE 1 TABLET BY MOUTH EVERY DAY 11/17/17  Yes Collene Gobble, MD  amoxicillin-clavulanate (AUGMENTIN) 875-125 MG tablet Take 1 tablet by mouth 2 (two) times daily. Patient not taking: Reported on 01/19/2018 01/13/18   Jonetta Osgood, MD  dexamethasone (DECADRON) 4 MG tablet 4 mg by mouth twice a day the day before, day of and day after the chemotherapy every 3 weeks. Patient not taking: Reported on 01/19/2018 02/17/17   [name], MD  Dextromethorphan-Guaifenesin [facility] DM PO) Take 20 mLs by mouth 2 (two) times daily as needed (chest congestion).     [provider]  diphenhydrAMINE (BENADRYL) 25 MG tablet Take 1 tablet (25 mg total) by mouth at bedtime as needed for sleep. Patient  not taking: Reported on 01/05/2018 12/24/17   Florencia Reasons, MD  feeding supplement, ENSURE ENLIVE, (ENSURE ENLIVE) LIQD Take 237 mLs by mouth 2 (two) times daily between meals. 01/13/18   Ghimire, Henreitta Leber, MD  magic mouthwash SOLN Take 5 mLs by mouth 4 (four) times daily. Prednisone 80 mg , Nystatin 30 ml, Benadryl 12.5 mg/5 ml. Mix to 240 ml, 1:1:1 concentration. Patient not taking: Reported on 01/19/2018 11/24/17   [name], MD  polyethylene glycol [facility] / Floria Raveling) packet Take 17 g by mouth daily. Patient not taking: Reported on 01/19/2018 12/25/17   Florencia Reasons, MD  senna-docusate (SENOKOT-S) 8.6-50 MG tablet Take 1 tablet by mouth at bedtime. Patient not taking: Reported on 01/19/2018 12/24/17   Florencia Reasons, MD  sodium chloride (OCEAN) 0.65 % SOLN nasal spray Place 2 sprays into both nostrils as needed for congestion.    [provider]    Family History Family  History  Problem Relation Age of Onset  . Clotting disorder Mother   . Diabetes Mellitus I Mother   . Prostate cancer Father     Social History Social History   Tobacco Use  . Smoking status: Former Smoker    Packs/day: 0.50    Years: 40.00    Pack years: 20.00    Types: Cigarettes    Last attempt to quit: 2010    Years since quitting: 9.1  . Smokeless tobacco: Never Used  Substance Use Topics  . Alcohol use: No  . Drug use: No     Allergies   Lidocaine   Review of Systems Review of Systems  All other systems reviewed and are negative.    Physical Exam Updated Vital Signs BP 131/84   Pulse (!) 126   Temp 99.6 F (37.6 C)   Resp (!) 32   Ht 1.803 m (5\' 11" )   Wt 83.5 kg (184 lb)   SpO2 97%   BMI 25.66 kg/m   Physical Exam  Constitutional: No distress.  Elderly, frail  HENT:  Head: Normocephalic and atraumatic.  Right Ear: External ear normal.  Left Ear: External ear normal.  Eyes: Conjunctivae are normal. Right eye exhibits no discharge. Left eye exhibits no discharge. No scleral icterus.  Neck: Neck supple. No tracheal deviation present.  Cardiovascular: Regular rhythm and intact distal pulses. Tachycardia present.  Pulmonary/Chest: Effort normal and breath sounds normal. No stridor. No respiratory distress. He has no wheezes. He has no rales.  Abdominal: Soft. Bowel sounds are normal. He exhibits no distension. There is no tenderness. There is no rebound and no guarding.  Musculoskeletal: He exhibits no edema or tenderness.  Neurological: He is alert. He has normal strength. No cranial nerve deficit (no facial droop, extraocular movements intact, no slurred speech) or sensory deficit. He exhibits normal muscle tone. He displays no seizure activity. Coordination normal.  Skin: Skin is warm and dry. No rash noted.  Psychiatric: He has a normal mood and affect.  Nursing note and vitals reviewed.    ED Treatments / Results  Labs (all labs ordered are  listed, but only abnormal results are displayed) Labs Reviewed  COMPREHENSIVE METABOLIC PANEL - Abnormal; Notable for the following components:      Result Value   Potassium 2.8 (*)    Chloride 112 (*)    CO2 20 (*)    Glucose, Bld 135 (*)    Calcium 7.4 (*)    Total Protein 5.6 (*)    Albumin 1.8 (*)  AST 105 (*)    Alkaline Phosphatase 139 (*)    All other components within normal limits  CBC WITH DIFFERENTIAL/PLATELET - Abnormal; Notable for the following components:   WBC 15.5 (*)    RBC 2.60 (*)    Hemoglobin 8.6 (*)    HCT 28.1 (*)    MCV 108.1 (*)    RDW 21.0 (*)    Platelets 105 (*)    Neutro Abs 14.8 (*)    Lymphs Abs 0.2 (*)    All other components within normal limits  PROTIME-INR - Abnormal; Notable for the following components:   Prothrombin Time 17.4 (*)    All other components within normal limits  BRAIN NATRIURETIC PEPTIDE - Abnormal; Notable for the following components:   B Natriuretic Peptide 160.7 (*)    All other components within normal limits  CULTURE, BLOOD (ROUTINE X 2)  CULTURE, BLOOD (ROUTINE X 2)  I-STAT CG4 LACTIC ACID, ED  I-STAT TROPONIN, ED    EKG  EKG Interpretation  Date/Time:  Wednesday January 19 2018 17:19:48 EST Ventricular Rate:  129 PR Interval:    QRS Duration: 59 QT Interval:  303 QTC Calculation: 444 R Axis:   59 Text Interpretation:  Sinus tachycardia Low voltage, extremity leads Nonspecific T abnormalities, lateral leads No STEMI.  Confirmed by Nanda Quinton [phone]) on 01/19/2018 5:23:58 PM       Radiology Dg Chest 2 View  Result Date: 01/19/2018 CLINICAL DATA:  Dyspnea and left-sided chest pain x3 days EXAM: CHEST  2 VIEW COMPARISON:  01/05/2018 CT and CXR FINDINGS: Right-sided PICC line tip terminates in the distal SVC. Heart size is normal. No aortic aneurysm is noted. Slight interval increase in moderate left pleural effusion. A small right pleural effusion is now noted as well. There is pulmonary vascular  congestion. IMPRESSION: Slight interval increase in bilateral pleural effusions, left greater than right. Persistent left basilar consolidation. Stable PICC line position. Electronically Signed   By: Ashley Royalty M.D.   On: 01/19/2018 18:04   Ct Angio Chest Pe W And/or Wo Contrast  Result Date: 01/19/2018 CLINICAL DATA:  Shortness of breath with left-sided chest pain history of lung cancer EXAM: CT ANGIOGRAPHY CHEST WITH CONTRAST TECHNIQUE: Multidetector CT imaging of the chest was performed using the standard protocol during bolus administration of intravenous contrast. Multiplanar CT image reconstructions and MIPs were obtained to evaluate the vascular anatomy. CONTRAST:  145mL ISOVUE-370 IOPAMIDOL (ISOVUE-370) INJECTION 76% COMPARISON:  Radiograph 01/19/2018, CT chest 01/05/2018, 12/14/2017, 11/24/2017, 09/20/2017, PET CT 10/05/2016 FINDINGS: Cardiovascular: Satisfactory opacification of the pulmonary arteries to the segmental level. No evidence of pulmonary embolism. Nonaneurysmal aorta. Mild aortic atherosclerosis. Vascular catheter tip in the distal SVC. Minimal coronary vascular calcification. Normal heart size. No large pericardial effusion Mediastinum/Nodes: Midline trachea. No thyroid mass. Probable left lobectomy. Increased upper mediastinal adenopathy. A prevascular lymph node measures 19 mm, compared with 17 mm previously. Lymph node adjacent to the arch measures 11 mm compared with 9 mm previously. Esophagus within normal limits. Lungs/Pleura: Moderate emphysema. Small right pleural effusion, new. Moderate to large left pleural effusion, increased. Left hilar infiltrative soft tissue mass/density is stable to minimally decreased. Left infrahilar soft tissue mass is contiguous with dense consolidation in the left lower lobe. A central focal area of low attenuation within the consolidated left lower lobe may represent necrosis. Residual narrowing of left lower lobe bronchi by soft tissue  mass/consolidation though with some improvement proximally. Some clearing of inflammatory infiltrates in the lingula, however multifocal  nodular densities or nodules are evident within the lingula. A masslike density is present in the anterior left lung base but is contiguous with left lower lobe consolidation. Upper Abdomen: No acute abnormality. Musculoskeletal: Degenerative changes of the spine. No acute or suspicious bone lesion. Surgical hardware partially seen in the proximal left humerus. Review of the MIP images confirms the above findings. IMPRESSION: 1. Negative for acute pulmonary embolus. 2. Moderate to large left pleural effusion, increased compared to prior. Continued dense left lower lobe pneumonia with new central low density in the consolidation possibly due to necrosis. Left lower lobe pneumonia is contiguous with ill-defined, infiltrative left hilar mass with narrowing of the left lower lobe bronchi. Multiple nodules or nodular foci of disease visualized within the lingula and anterior left lung base. 3. Increased mediastinal adenopathy since the most recent chest CT. 4. New right pleural effusion, small 5. Emphysema Aortic Atherosclerosis (ICD10-I70.0) and Emphysema (ICD10-J43.9). Electronically Signed   By: Donavan Foil M.D.   On: 01/19/2018 21:02    Procedures .Critical Care Performed by: Dorie Rank, MD Authorized by: Dorie Rank, MD   Critical care provider statement:    Critical care time (minutes):  30   Critical care was time spent personally by me on the following activities:  Discussions with consultants, evaluation of patient's response to treatment, examination of patient, ordering and performing treatments and interventions, ordering and review of laboratory studies, ordering and review of radiographic studies, pulse oximetry, re-evaluation of patient's condition, obtaining history from patient or surrogate and review of old charts   (including critical care  time)  Medications Ordered in ED Medications  sodium chloride 0.9 % injection (not administered)  ceFEPIme (MAXIPIME) 2 g in sodium chloride 0.9 % 100 mL IVPB (not administered)  vancomycin (VANCOCIN) 1,500 mg in sodium chloride 0.9 % 500 mL IVPB (not administered)  sodium chloride 0.9 % bolus 1,000 mL (not administered)  potassium chloride 10 mEq in 100 mL IVPB (10 mEq Intravenous New Bag/Given 01/19/18 2050)  iopamidol (ISOVUE-370) 76 % injection (100 mLs Intravenous Contrast Given 01/19/18 2023)     Initial Impression / Assessment and Plan / ED Course  I have reviewed the triage vital signs and the nursing notes.  Pertinent labs & imaging results that were available during my care of the patient were reviewed by me and considered in my medical decision making (see chart for details).  Clinical Course as of Jan 19 2201  Wed Jan 19, 2018  1740 Patient presents to the emergency room with complaints of shortness of breath and weakness.  Patient appears to be tachycardic.  Patient has history of pericardial effusion recently.  He is also had pneumonia and pulmonary embolisms.  Will proceed with laboratory testing and chest x-ray   [JK]    Clinical Course User Index [JK] Dorie Rank, MD   Patient presented to the emergency room with complaints of increasing weakness and shortness of breath.  Patient's wife provided some additional history and she was concerned that he seems to be weaker in the legs.  On my exam he seems to have generalized weakness and he has no sensory deficits.  He is not complain of any back pain.  I suspect his weakness is related to his malignancy and his respiratory difficulties associated with his persistent postobstructive pneumonia, and pleural effusions.  Patient has been started on IV fluids.  He has been given antibiotics.  Plan on admission to the hospital for further treatment.  Discussed this plan with  the patient and his wife.  Patient was told by Dr. Earlie Server  previously that this cancer was not curable.  We may want to consider palliative care consultation during his hospitalization.   Final Clinical Impressions(s) / ED Diagnoses   Final diagnoses:  Hypokalemia  Anemia, unspecified type  Malignant neoplasm of lung, unspecified laterality, unspecified part of lung ([place])  Recurrent pneumonia      Dorie Rank, MD 01/19/18 2203

## 2018-01-19 NOTE — ED Triage Notes (Signed)
Pt c/o SOB and Left sided chest pain x 3 days. Hx of Left Lower lung Ca, followed at [facility]. Has appt Wednesday. Pt is on chemo.  C/o pain in back. Coughing up phlem, unk color,.

## 2018-01-19 NOTE — ED Notes (Signed)
Bed: WA08 Expected date:  Expected time:  Means of arrival:  Comments: EMS/weak

## 2018-01-19 NOTE — Progress Notes (Signed)
A consult was received from an ED physician for vancomycin per pharmacy dosing.  The patient's profile has been reviewed for ht/wt/allergies/indication/available labs.   A one time order has been placed for vancomycin 1500mg  IV x 1.  Also increase cefepime to 2gm VI x 1.    Further antibiotics/pharmacy consults should be ordered by admitting physician if indicated.                       Thank you,  Doreene Eland, PharmD, BCPS.   01/19/2018 9:35 PM

## 2018-01-19 NOTE — Progress Notes (Signed)
  Radiation Oncology         [phone] ________________________________  Name: Nicholas Hale MRN: [id number]  Date: 01/18/2018  DOB: 1946-10-25  End of Treatment Note  Diagnosis:   Stage IV (T1a, N3, M1b) non-small cell lung cancer, adenocarcinoma with PDL 1 expression of 80%,now with local progression.     Indication for treatment:  Palliative, bronchial compression postobstructive atelectasis changes, respiratory compromise       Radiation treatment dates:   01/07/2018 to 01/18/2018  Site/dose:   The Left lung was retreated to 24 Gy in 8 fractions of 3 Gy.  Beams/energy:   3D // 10X, 6X  Narrative: The patient tolerated radiation treatment relatively well.   The patient experienced throat soreness, slight cough with clear sputum, shortness of breath at rest and with exertion, moderate pain, and difficulty swallowing. He denies weight loss or hemoptysis. The left lung is positive for decreased breath sounds.   Plan: The patient has completed radiation treatment. Overall his breathing has improved and he is not requiring supplemental oxygen at this time. The patient will return to radiation oncology clinic for routine followup in one month. I advised them to call or return sooner if they have any questions or concerns related to their recovery or treatment.  -----------------------------------  Blair Promise, PhD, MD  This document serves as a record of services personally performed by Gery Pray, MD. It was created on his behalf by Arlyce Harman, a trained medical scribe. The creation of this record is based on the scribe's personal observations and the provider's statements to them. This document has been checked and approved by the attending provider.

## 2018-01-19 NOTE — Telephone Encounter (Signed)
  PT called with update- Pt is tachycardic at rest @115  with exertion rate increases 130.  Still having chest pain/esophagitis that responds to oral morphine. I called wife and she reports his oxygen sat has been in 80's ( home monitor) on spot checks with exertion. I requested , Richarda Blade, from [facility] to have PT  do in home oxygen sat testing. Pt has appt with Dr. Lamonte Sakai on Monday and I told her pt needs to keep that.

## 2018-01-19 NOTE — Progress Notes (Signed)
Pharmacy Antibiotic Note  Nicholas Hale is a 72 y.o. male with SOB and weakness admitted on 01/19/2018 with pneumonia.  Pharmacy has been consulted for zosyn and vancomycin dosing.  Plan: Zosyn 3.375g IV q8h (4 hour infusion).  Vancomycin 1500 mg IV x1 then 750 mg IV q12h for est AUC=431.5 Goal AUC=400-500 Daily Scr F/u cultures/levels  Height: 5\' 11"  (180.3 cm) Weight: 184 lb (83.5 kg) IBW/kg (Calculated) : 75.3  Temp (24hrs), Avg:99.2 F (37.3 C), Min:98.8 F (37.1 C), Max:99.6 F (37.6 C)  Recent Labs  Lab 01/19/18 1814 01/19/18 1827  WBC 15.5*  --   CREATININE 0.93  --   LATICACIDVEN  --  1.51    Estimated Creatinine Clearance: 77.6 mL/min (by C-G formula based on SCr of 0.93 mg/dL).    Allergies  Allergen Reactions  . Lidocaine Swelling    "Hard white blisters" formed on tongue, swollen mouth    Antimicrobials this admission: 2/20 cefepime >> x1 ED 2/20 vancomycin >>  2/21 zosyn >>  Dose adjustments this admission:   Microbiology results:  BCx:   UCx:    Sputum:    MRSA PCR:   Thank you for allowing pharmacy to be a part of this patient's care.  Dorrene German 01/19/2018 11:07 PM

## 2018-01-20 ENCOUNTER — Other Ambulatory Visit: Payer: Medicare Other

## 2018-01-20 ENCOUNTER — Telehealth: Payer: Self-pay | Admitting: Medical Oncology

## 2018-01-20 ENCOUNTER — Other Ambulatory Visit: Payer: Self-pay

## 2018-01-20 ENCOUNTER — Encounter (HOSPITAL_COMMUNITY): Payer: Self-pay

## 2018-01-20 DIAGNOSIS — D696 Thrombocytopenia, unspecified: Secondary | ICD-10-CM

## 2018-01-20 DIAGNOSIS — D649 Anemia, unspecified: Secondary | ICD-10-CM

## 2018-01-20 DIAGNOSIS — A0472 Enterocolitis due to Clostridium difficile, not specified as recurrent: Secondary | ICD-10-CM | POA: Diagnosis present

## 2018-01-20 DIAGNOSIS — C3492 Malignant neoplasm of unspecified part of left bronchus or lung: Secondary | ICD-10-CM

## 2018-01-20 DIAGNOSIS — R7989 Other specified abnormal findings of blood chemistry: Secondary | ICD-10-CM

## 2018-01-20 DIAGNOSIS — J189 Pneumonia, unspecified organism: Secondary | ICD-10-CM

## 2018-01-20 LAB — MRSA PCR SCREENING: MRSA by PCR: NEGATIVE

## 2018-01-20 LAB — MAGNESIUM: MAGNESIUM: 2.3 mg/dL (ref 1.7–2.4)

## 2018-01-20 LAB — COMPREHENSIVE METABOLIC PANEL
ALT: 53 U/L (ref 17–63)
AST: 125 U/L — ABNORMAL HIGH (ref 15–41)
Albumin: 1.8 g/dL — ABNORMAL LOW (ref 3.5–5.0)
Alkaline Phosphatase: 136 U/L — ABNORMAL HIGH (ref 38–126)
Anion gap: 11 (ref 5–15)
BUN: 17 mg/dL (ref 6–20)
CO2: 20 mmol/L — AB (ref 22–32)
Calcium: 7.5 mg/dL — ABNORMAL LOW (ref 8.9–10.3)
Chloride: 111 mmol/L (ref 101–111)
Creatinine, Ser: 0.95 mg/dL (ref 0.61–1.24)
Glucose, Bld: 136 mg/dL — ABNORMAL HIGH (ref 65–99)
POTASSIUM: 3.1 mmol/L — AB (ref 3.5–5.1)
SODIUM: 142 mmol/L (ref 135–145)
Total Bilirubin: 0.8 mg/dL (ref 0.3–1.2)
Total Protein: 5.8 g/dL — ABNORMAL LOW (ref 6.5–8.1)

## 2018-01-20 LAB — C DIFFICILE QUICK SCREEN W PCR REFLEX
C Diff antigen: POSITIVE — AB
C Diff interpretation: DETECTED
C Diff toxin: POSITIVE — AB

## 2018-01-20 LAB — CBC
HEMATOCRIT: 29.8 % — AB (ref 39.0–52.0)
HEMOGLOBIN: 9 g/dL — AB (ref 13.0–17.0)
MCH: 32.6 pg (ref 26.0–34.0)
MCHC: 30.2 g/dL (ref 30.0–36.0)
MCV: 108 fL — AB (ref 78.0–100.0)
Platelets: 105 10*3/uL — ABNORMAL LOW (ref 150–400)
RBC: 2.76 MIL/uL — AB (ref 4.22–5.81)
RDW: 21 % — ABNORMAL HIGH (ref 11.5–15.5)
WBC: 16.7 10*3/uL — AB (ref 4.0–10.5)

## 2018-01-20 LAB — TSH: TSH: 5.701 u[IU]/mL — AB (ref 0.350–4.500)

## 2018-01-20 MED ORDER — ADULT MULTIVITAMIN W/MINERALS CH
1.0000 | ORAL_TABLET | Freq: Every day | ORAL | Status: DC
  Administered 2018-01-22: 1 via ORAL
  Filled 2018-01-20: qty 1

## 2018-01-20 MED ORDER — CHLORHEXIDINE GLUCONATE CLOTH 2 % EX PADS
6.0000 | MEDICATED_PAD | Freq: Every day | CUTANEOUS | Status: DC
  Administered 2018-01-20 – 2018-01-22 (×3): 6 via TOPICAL

## 2018-01-20 MED ORDER — SUCRALFATE 1 G PO TABS
1.0000 g | ORAL_TABLET | Freq: Three times a day (TID) | ORAL | Status: DC
Start: 2018-01-20 — End: 2018-01-22
  Administered 2018-01-20 – 2018-01-22 (×6): 1 g via ORAL
  Filled 2018-01-20 (×7): qty 1

## 2018-01-20 MED ORDER — METOPROLOL TARTRATE 12.5 MG HALF TABLET
12.5000 mg | ORAL_TABLET | Freq: Two times a day (BID) | ORAL | Status: DC
  Administered 2018-01-20 – 2018-01-22 (×5): 12.5 mg via ORAL
  Filled 2018-01-20 (×7): qty 1

## 2018-01-20 MED ORDER — VANCOMYCIN HCL IN DEXTROSE 750-5 MG/150ML-% IV SOLN
750.0000 mg | Freq: Two times a day (BID) | INTRAVENOUS | Status: DC
  Filled 2018-01-20: qty 150

## 2018-01-20 MED ORDER — ATORVASTATIN CALCIUM 10 MG PO TABS
20.0000 mg | ORAL_TABLET | Freq: Every day | ORAL | Status: DC
  Administered 2018-01-20: 20 mg via ORAL
  Filled 2018-01-20: qty 2

## 2018-01-20 MED ORDER — VANCOMYCIN 50 MG/ML ORAL SOLUTION
125.0000 mg | Freq: Four times a day (QID) | ORAL | Status: DC
  Administered 2018-01-20 – 2018-01-22 (×9): 125 mg via ORAL
  Filled 2018-01-20 (×9): qty 2.5

## 2018-01-20 MED ORDER — ENSURE ENLIVE PO LIQD: 237.0000 mL | Freq: Two times a day (BID) | ORAL | Status: DC

## 2018-01-20 MED ORDER — SODIUM CHLORIDE 0.9% FLUSH
10.0000 mL | Freq: Two times a day (BID) | INTRAVENOUS | Status: DC
  Administered 2018-01-20: 30 mL
  Administered 2018-01-20 – 2018-01-21 (×2): 20 mL
  Administered 2018-01-22: 10 mL

## 2018-01-20 MED ORDER — ONDANSETRON HCL 4 MG PO TABS: 4.0000 mg | ORAL_TABLET | Freq: Four times a day (QID) | ORAL | Status: DC | PRN

## 2018-01-20 MED ORDER — LEVALBUTEROL HCL 0.63 MG/3ML IN NEBU
0.6300 mg | INHALATION_SOLUTION | Freq: Four times a day (QID) | RESPIRATORY_TRACT | Status: DC | PRN
Start: 2018-01-20 — End: 2018-01-22
  Administered 2018-01-20: 0.63 mg via RESPIRATORY_TRACT
  Filled 2018-01-20: qty 3

## 2018-01-20 MED ORDER — SODIUM CHLORIDE 0.9 % IV SOLN: INTRAVENOUS | Status: DC

## 2018-01-20 MED ORDER — SODIUM CHLORIDE 0.9% FLUSH: 10.0000 mL | INTRAVENOUS | Status: DC | PRN

## 2018-01-20 MED ORDER — GUAIFENESIN ER 600 MG PO TB12
600.0000 mg | ORAL_TABLET | Freq: Two times a day (BID) | ORAL | Status: DC
  Administered 2018-01-20 – 2018-01-22 (×4): 600 mg via ORAL
  Filled 2018-01-20 (×5): qty 1

## 2018-01-20 MED ORDER — LEVALBUTEROL HCL 0.63 MG/3ML IN NEBU
0.6300 mg | INHALATION_SOLUTION | Freq: Three times a day (TID) | RESPIRATORY_TRACT | Status: DC
  Filled 2018-01-20: qty 3

## 2018-01-20 MED ORDER — IPRATROPIUM BROMIDE 0.02 % IN SOLN
0.5000 mg | RESPIRATORY_TRACT | Status: DC | PRN
Start: 2018-01-20 — End: 2018-01-22
  Administered 2018-01-20: 0.5 mg via RESPIRATORY_TRACT
  Filled 2018-01-20: qty 2.5

## 2018-01-20 MED ORDER — ONDANSETRON HCL 4 MG/2ML IJ SOLN
4.0000 mg | Freq: Four times a day (QID) | INTRAMUSCULAR | Status: DC | PRN
Start: 2018-01-20 — End: 2018-01-22

## 2018-01-20 MED ORDER — POTASSIUM CHLORIDE CRYS ER 20 MEQ PO TBCR
40.0000 meq | EXTENDED_RELEASE_TABLET | Freq: Once | ORAL | Status: AC
  Administered 2018-01-20: 40 meq via ORAL
  Filled 2018-01-20: qty 2

## 2018-01-20 MED ORDER — MIRTAZAPINE 15 MG PO TBDP
15.0000 mg | ORAL_TABLET | Freq: Every day | ORAL | Status: DC
  Administered 2018-01-20 – 2018-01-21 (×3): 15 mg via ORAL
  Filled 2018-01-20 (×3): qty 1

## 2018-01-20 MED ORDER — IPRATROPIUM BROMIDE 0.02 % IN SOLN
0.5000 mg | Freq: Three times a day (TID) | RESPIRATORY_TRACT | Status: DC
  Filled 2018-01-20: qty 2.5

## 2018-01-20 MED ORDER — ENSURE ENLIVE PO LIQD
237.0000 mL | Freq: Two times a day (BID) | ORAL | Status: DC
  Administered 2018-01-21: 237 mL via ORAL

## 2018-01-20 MED ORDER — ACETAMINOPHEN 650 MG RE SUPP: 650.0000 mg | Freq: Four times a day (QID) | RECTAL | Status: DC | PRN

## 2018-01-20 MED ORDER — RIVAROXABAN 20 MG PO TABS
20.0000 mg | ORAL_TABLET | Freq: Every day | ORAL | Status: DC
  Administered 2018-01-20 – 2018-01-21 (×2): 20 mg via ORAL
  Filled 2018-01-20 (×2): qty 1

## 2018-01-20 MED ORDER — OXYCODONE HCL 5 MG/5ML PO SOLN
5.0000 mg | ORAL | Status: DC | PRN
  Administered 2018-01-21: 5 mg via ORAL
  Filled 2018-01-20: qty 5

## 2018-01-20 MED ORDER — ACETAMINOPHEN 325 MG PO TABS: 650.0000 mg | ORAL_TABLET | Freq: Four times a day (QID) | ORAL | Status: DC | PRN

## 2018-01-20 MED ORDER — SODIUM CHLORIDE 0.9 % IV BOLUS (SEPSIS)
500.0000 mL | Freq: Once | INTRAVENOUS | Status: AC
  Administered 2018-01-20: 500 mL via INTRAVENOUS

## 2018-01-20 MED ORDER — BOOST / RESOURCE BREEZE PO LIQD CUSTOM
1.0000 | ORAL | Status: DC
  Administered 2018-01-20: 1 via ORAL

## 2018-01-20 NOTE — Progress Notes (Signed)
Pt refused breathing TX and explained to the RT that he told the doctors her didn't want to take any breathing TX. RT will continue to monitor

## 2018-01-20 NOTE — Progress Notes (Signed)
Initial Nutrition Assessment  DOCUMENTATION CODES:   Not applicable  INTERVENTION:  - Continue Ensure Enlive BID, each supplement provides 350 kcal and 20 grams of protein. - Will order Boost Breeze once/day, this supplement provides 250 kcal and 9 grams of protein. - Will order daily multivitamin with minerals. - Diet advancement as medically feasible. - Continue to encourage PO intakes.   NUTRITION DIAGNOSIS:   Increased nutrient needs related to catabolic illness, cancer and cancer related treatments as evidenced by estimated needs.  GOAL:   Patient will meet greater than or equal to 90% of their needs  MONITOR:   PO intake, Supplement acceptance, Diet advancement, Weight trends, Labs  REASON FOR ASSESSMENT:   Malnutrition Screening Tool  ASSESSMENT:   72 y.o.malewith medical history significant ofstage IV non-small lung cell cancer undergoing radiation. He was recently discharged from hospital 2/14 and treated at that time for sepsis and post obstructive PNA. He  presentedwith worsening generalized, physical weakness. Per wife patient has not been able to stand or walk with his walker. He has been having worsening SOB over last week. He finished his last radiation treatment on 2/19. He reportedwatery and loose stool over last 5 days.  BMI indicates overweight status, appropriate for age. No intakes documented since admission. Pt fidgety, pulling at leads and BP cuff during RD visit. Wife was at bedside and provided nearly all information. Pt reports improvement in how his stomach is feeling this AM but unable to give further detail on this. He was able to consume a few bites of grits and a cup of orange juice without issue. His appetite has been "fair" for several weeks. He typically eats well for breakfast and dinner and at lunch will have an Ensure/Boost or a protein bar. He has had no problems with swallowing and is able to chew without difficulty as long as he is wearing  his partial. His appetite has decreased in the past few days which wife attributes to current medical condition.   Wife reports that he has lost 10 lbs in the past 1 year and that more recently weight has remained stable. Per chart review, weight has fluctuated frequently over the past 2 months. Will continue to monitor.  Medications reviewed; 2 g IV Mg sulfate x1 run yesterday, 10 mEq IV KCl x4 runs yesterday, 20 mEq oral KCl x1 dose yesterday, 40 mEq oral KCl x1 dose today, 1 g Carafate TID.  Labs reviewed; K: 3.1 mmol/L, Ca: 7.5 mg/dL, Alk Phos elevated, AST elevated.   IVF: NS @ 75 mL/hr.       NUTRITION - FOCUSED PHYSICAL EXAM:    Most Recent Value  Orbital Region  Mild depletion  Upper Arm Region  No depletion  Thoracic and Lumbar Region  No depletion  Buccal Region  No depletion  Temple Region  Mild depletion  Clavicle Bone Region  No depletion  Clavicle and Acromion Bone Region  No depletion  Scapular Bone Region  Unable to assess  Dorsal Hand  No depletion  Patellar Region  No depletion  Anterior Thigh Region  No depletion  Posterior Calf Region  No depletion  Edema (RD Assessment)  Mild  Hair  Reviewed  Eyes  Reviewed  Mouth  Unable to assess  Skin  Reviewed  Nails  Reviewed       Diet Order:  Diet full liquid Room service appropriate? Yes; Fluid consistency: Thin  EDUCATION NEEDS:   No education needs have been identified at this time  Skin:  Skin Assessment: Reviewed RN Assessment  Last BM:  2/21  Height:   Ht Readings from Last 1 Encounters:  01/20/18 '5\' 11"'$  (1.803 m)    Weight:   Wt Readings from Last 1 Encounters:  01/20/18 183 lb 10.3 oz (83.3 kg)    Ideal Body Weight:  78.18 kg  BMI:  Body mass index is 25.61 kg/m.  Estimated Nutritional Needs:   Kcal:  2080-2250 (25-27 kcal/kg)  Protein:  117-125 grams (1.4-1.5 grams/kg)  Fluid:  >/= 2.2 L/day      Jarome Matin, MS, RD, LDN, [facility] Inpatient Clinical Dietitian Pager #  [phone] After hours/weekend pager # [phone]

## 2018-01-20 NOTE — Progress Notes (Signed)
[facility]  Patient Status: Active (receiving services up to time of hospitalization)  [facility] is providing the following services: RN, PT and OT  If patient discharges after hours, please call [phone].   Nicholas Hale 01/20/2018, 8:55 AM

## 2018-01-20 NOTE — Telephone Encounter (Signed)
Reached out to wife for emotional support.  She asked me to cancel appt for Dr Lamonte Sakai which I did.

## 2018-01-20 NOTE — Consult Note (Signed)
Consultation Note Date: 01/20/2018   Patient Name: Nicholas Hale  DOB: Aug 02, 1946  MRN: [id number]  Age / Sex: 72 y.o., male  PCP: Rankins, Nicholas Salinas, MD Referring Physician: Bonnielee Haff, MD  Reason for Consultation: Establishing goals of care  HPI/Patient Profile: 72 y.o. male   admitted on 01/19/2018    Clinical Assessment and Goals of Care:  72 yo gentleman with PMH stage IV NSCLC, followed by Dr Nicholas Hale, recently seen by PMT in past hospitalization earlier this month, has had recurrent hospitalizations recently for post obstructive PNA, MSSA bacteremia. History of PE and DVT.   Patient came in with generalized weakness, fatigue, elevated heart rate, found to have worsening L pleural effusion, continued LLL PNA. He now also has +C Diff. He is currently in the step down [facility] at [facility].   A palliative consult has been requested for ongoing Nicholas Hale discussions. Since being admitted, the patient has been refusing breathing treatments and in the ED he did not want to be given antibiotics.   Nicholas Hale is resting in bed, he is confused and appears weak. His son is at his bedside, patient appears more dyspneic, patient has cough, weak cough, but is able to expectorate some.   Palliative consult continues.   Patient's son is very concerned about the patient's ongoing decline, he becomes tearful and states that he fears that is Dad is reaching the end of his life. I have discussed extensively with son about the patient's current condition.   Call placed and discussed with wife. She states that the patient has become extremely and profoundly weaker, since his last hospitalization, since his radiation treatments. He is no longer able to ambulate at home. He has been saying about how he is "done", wife states that in the ED, the patient asked for medications that will cause him to die to be given to  him.   Daughter who is an NP student is reportedly not in town.   I have discussed in detail with Nicholas Hale about goals/wishes and values most important to the patient and family at this time. We discussed about comfort measures and residential hospice in detail. Wife has asked about [employer]. See additional discussions and recommendations below, thank you for the consult, PMT to continue to follow along.    NEXT OF KIN  wife, son, daughter.   SUMMARY OF RECOMMENDATIONS   Agree with DNR DNI Agree with current mode of care for now, med onc input also requested. Goals of care are shifting towards comfort measures, transitioning care towards residential hospice, how ever, wife wishes to re discuss further.  PMT to follow up with patient and family in am on 01-21-18 at 0930. Further recommendations to follow.  Thank you for the consult.  Code Status/Advance Care Planning:  DNR    Symptom Management:    as above   Palliative Prophylaxis:   Delirium Protocol      Psycho-social/Spiritual:   Desire for further Chaplaincy support:yes  Additional Recommendations: Education on Hospice  Prognosis:   Guarded, could be less than 2 weeks at this point.   Discharge Planning: briefly discussed with family about residential hospice, further discussions pending outcome of family meeting on 01-21-18.       Primary Diagnoses: Present on Admission: . Pneumonia . Hypertension . Hyperlipidemia . Adenocarcinoma of left lung, stage 4 ([place]) . Thrombocytopenia ([place]) . Malnutrition of moderate degree . Elevated TSH . Postobstructive pneumonia . C. difficile diarrhea   I have reviewed the medical record, interviewed the patient and family, and examined the patient. The following aspects are pertinent.  Past Medical History:  Diagnosis Date  . Abdominal aortic aneurysm ([place])   . Arthritis    "hips; lower spine" (05/24/2014)  . Chemotherapy induced neutropenia ([place]) 03/31/2017    . Chronic fatigue 10/05/2016  . Encounter for antineoplastic chemotherapy 02/17/2017  . History of radiation therapy 10/05/17-10/25/17   left lung/chest 35 Gy in 14 fractions  . Hyperlipidemia   . Hypertension   . Lung cancer ([place]) 09/25/2016  . PE (pulmonary embolism) 9/15  . Pericardial effusion 09/20/2016   Malignant effusion s/p pericardial drain  . Personal history of DVT (deep vein thrombosis) 9/15   Social History   Socioeconomic History  . Marital status: Married    Spouse name: None  . Number of children: None  . Years of education: None  . Highest education level: None  Social Needs  . Financial resource strain: None  . Food insecurity - worry: None  . Food insecurity - inability: None  . Transportation needs - medical: None  . Transportation needs - non-medical: None  Occupational History  . None  Tobacco Use  . Smoking status: Former Smoker    Packs/day: 0.50    Years: 40.00    Pack years: 20.00    Types: Cigarettes    Last attempt to quit: 2010    Years since quitting: 9.1  . Smokeless tobacco: Never Used  Substance and Sexual Activity  . Alcohol use: No  . Drug use: No  . Sexual activity: No  Other Topics Concern  . None  Social History Narrative  . None   Family History  Problem Relation Age of Onset  . Clotting disorder Mother   . Diabetes Mellitus I Mother   . Prostate cancer Father    Scheduled Meds: . atorvastatin  20 mg Oral q1800  . Chlorhexidine Gluconate Cloth  6 each Topical Daily  . feeding supplement  1 Container Oral Q24H  . feeding supplement (ENSURE ENLIVE)  237 mL Oral BID BM  . guaiFENesin  600 mg Oral BID  . metoprolol tartrate  12.5 mg Oral BID  . mirtazapine  15 mg Oral QHS  . multivitamin with minerals  1 tablet Oral Daily  . rivaroxaban  20 mg Oral q1800  . sodium chloride flush  10-40 mL Intracatheter Q12H  . sucralfate  1 g Oral TID WC & HS  . vancomycin  125 mg Oral Q6H   Continuous Infusions: . sodium chloride  75 mL/hr at 01/20/18 1000  . piperacillin-tazobactam (ZOSYN)  IV Stopped (01/20/18 1028)   PRN Meds:.acetaminophen **OR** acetaminophen, ipratropium, levalbuterol, metoprolol tartrate, morphine injection, ondansetron **OR** ondansetron (ZOFRAN) IV, oxyCODONE, sodium chloride flush Medications Prior to Admission:  Prior to Admission medications   Medication Sig Start Date End Date Taking? Authorizing Provider  albuterol (PROVENTIL HFA;VENTOLIN HFA) 108 (90 Base) MCG/ACT inhaler Inhale 2 puffs into the lungs every 4 (four) hours as needed for wheezing or shortness of breath.  07/27/17  Yes Collene Gobble, MD  atorvastatin (LIPITOR) 20 MG tablet TAKE 1 TABLET BY MOUTH EVERY DAY AT [facility] 10/12/17  Yes End, Harrell Gave, MD  ipratropium (ATROVENT) 0.02 % nebulizer solution Take 2.5 mLs (0.5 mg total) by nebulization 3 (three) times daily. 01/13/18  Yes Ghimire, Henreitta Leber, MD  levalbuterol Penne Lash) 0.63 MG/3ML nebulizer solution Take 3 mLs (0.63 mg total) by nebulization 3 (three) times daily. 01/13/18  Yes Ghimire, Henreitta Leber, MD  lidocaine-prilocaine (EMLA) cream Apply 1 application topically daily as needed (on port site).    Yes [provider]  metoprolol tartrate (LOPRESSOR) 25 MG tablet Take 0.5 tablets (12.5 mg total) by mouth 2 (two) times daily. 01/13/18 01/13/19 Yes Ghimire, Henreitta Leber, MD  mirtazapine (REMERON SOL-TAB) 15 MG disintegrating tablet Take 1 tablet (15 mg total) by mouth at bedtime. 01/13/18  Yes Ghimire, Henreitta Leber, MD  oxyCODONE (ROXICODONE) 5 MG/5ML solution Take 5 mLs (5 mg total) by mouth every 4 (four) hours as needed for severe pain. 12/02/17  Yes Tanner, Lyndon Code., PA-C  prochlorperazine (COMPAZINE) 10 MG tablet Take 1 tablet (10 mg total) by mouth every 6 (six) hours as needed for nausea or vomiting. 11/24/17  Yes [name], MD  sucralfate (CARAFATE) 1 g tablet Take 1 tablet (1 g total) by mouth 4 (four) times daily -  with meals and at bedtime. Crush and dissolve in 10 cc  of water prior to swallowing Patient taking differently: Take 1 g by mouth 4 (four) times daily -  before meals and at bedtime. Crush and dissolve in 10 cc of water prior to swallowing 10/25/17  Yes Gery Pray, MD  XARELTO 20 MG TABS tablet TAKE 1 TABLET BY MOUTH EVERY DAY 11/17/17  Yes Collene Gobble, MD  amoxicillin-clavulanate (AUGMENTIN) 875-125 MG tablet Take 1 tablet by mouth 2 (two) times daily. Patient not taking: Reported on 01/19/2018 01/13/18   Jonetta Osgood, MD  dexamethasone (DECADRON) 4 MG tablet 4 mg by mouth twice a day the day before, day of and day after the chemotherapy every 3 weeks. Patient not taking: Reported on 01/19/2018 02/17/17   [name], MD  Dextromethorphan-Guaifenesin [facility] DM PO) Take 20 mLs by mouth 2 (two) times daily as needed (chest congestion).     [provider]  diphenhydrAMINE (BENADRYL) 25 MG tablet Take 1 tablet (25 mg total) by mouth at bedtime as needed for sleep. Patient not taking: Reported on 01/05/2018 12/24/17   Florencia Reasons, MD  feeding supplement, ENSURE ENLIVE, (ENSURE ENLIVE) LIQD Take 237 mLs by mouth 2 (two) times daily between meals. 01/13/18   Ghimire, Henreitta Leber, MD  magic mouthwash SOLN Take 5 mLs by mouth 4 (four) times daily. Prednisone 80 mg , Nystatin 30 ml, Benadryl 12.5 mg/5 ml. Mix to 240 ml, 1:1:1 concentration. Patient not taking: Reported on 01/19/2018 11/24/17   [name], MD  polyethylene glycol [facility] / Floria Raveling) packet Take 17 g by mouth daily. Patient not taking: Reported on 01/19/2018 12/25/17   Florencia Reasons, MD  senna-docusate (SENOKOT-S) 8.6-50 MG tablet Take 1 tablet by mouth at bedtime. Patient not taking: Reported on 01/19/2018 12/24/17   Florencia Reasons, MD  sodium chloride (OCEAN) 0.65 % SOLN nasal spray Place 2 sprays into both nostrils as needed for congestion.    [provider]   Allergies  Allergen Reactions  . Lidocaine Swelling    "Hard white blisters" formed on tongue, swollen mouth    Review of Systems +weakness +confusion  Physical Exam Awake, not very alert Has cough+ Coarse breath sounds S1 S2 No abd distension No edema  Vital Signs: BP (!) 107/50   Pulse (!) 133   Temp (!) 101.1 F (38.4 C) (Oral)   Resp (!) 46   Ht 5\' 11"  (1.803 m)   Wt 83.3 kg (183 lb 10.3 oz)   SpO2 99%   BMI 25.61 kg/m  Pain Assessment: No/denies pain   Pain Score: Asleep   SpO2: SpO2: 99 % O2 Device:SpO2: 99 % O2 Flow Rate: .O2 Flow Rate (L/min): 2 L/min  IO: Intake/output summary:   Intake/Output Summary (Last 24 hours) at 01/20/2018 1357 Last data filed at 01/20/2018 1000 Gross per 24 hour  Intake 1186.25 ml  Output 1 ml  Net 1185.25 ml    LBM:   Baseline Weight: Weight: 83.5 kg (184 lb) Most recent weight: Weight: 83.3 kg (183 lb 10.3 oz)     Palliative Assessment/Data:   Flowsheet Rows     Most Recent Value  Intake Tab  Referral Department  Hospitalist  Unit at Time of Referral  ICU  Palliative Care Primary Diagnosis  Cancer  Palliative Care Type  Return patient Palliative Care  Reason for referral  Non-pain Symptom, End of Life Care Assistance, Counsel Regarding Hospice, Clarify Goals of Care  Date first seen by Palliative Care  01/20/18  Clinical Assessment  Palliative Performance Scale Score  20%  Pain Max last 24 hours  4  Pain Min Last 24 hours  3  Dyspnea Max Last 24 Hours  6  Dyspnea Min Last 24 hours  4  Nausea Max Last 24 Hours  2  Nausea Min Last 24 Hours  1  Anxiety Max Last 24 Hours  4  Anxiety Min Last 24 Hours  3  Psychosocial & Spiritual Assessment  Palliative Care Outcomes  Patient/Family meeting held?  Yes  Who was at the meeting?  patient(who is confused and no longer decisional) son at bedside, called wife over the phone.       Time In: 1400 Time Out: 1510 Time Total: 70 min  Greater than 50%  of this time was spent counseling and coordinating care related to the above assessment and plan.  Signed by: Loistine Chance, MD   [phone] Please contact Palliative Medicine Team phone at [phone] for questions and concerns.  For individual provider: See Shea Evans

## 2018-01-20 NOTE — Progress Notes (Signed)
[facility] PROGRESS NOTE  Nicholas Hale GHW:[id number] DOB: 1946-10-05 DOA: 01/19/2018  PCP: [name], MD  Brief History/Interval Summary: 72 year old Caucasian male with past medical history significant for stage IV non-small cell lung cancer followed by Dr. [name] recently completed palliative radiation.  He has had 2 hospitalizations in January and then in February for postobstructive pneumonia, MSSA bacteremia.  He also has a history of PE and DVT on Xarelto.  Comes in with complains of generalized weakness, fatigue, elevated heart rate.  There was evidence for worsening left-sided pleural effusion and continued left lower lobe pneumonia.  He was hospitalized for further management.  Subsequently found to have C. difficile.  Reason for Visit: Acute respiratory failure with hypoxia  Consultants: Palliative medicine.  Oncology.  Procedures: None yet  Antibiotics: Started on vancomycin and Zosyn.  Vancomycin discontinued. Started on oral vancomycin  Subjective/Interval History: Patient mentions that he is feeling tired.  He has been refusing breathing treatments.  He states that he wants to be comfortable for the most part.  He is not very sure about thoracentesis.  His wife is at the bedside.  He denies any chest pain.  Does have shortness of breath.  Continues to have loose stools.  Some abdominal pain is also present.  ROS: Denies any nausea.  Objective:  Vital Signs  Vitals:   01/20/18 0500 01/20/18 0800 01/20/18 0900 01/20/18 1000  BP: (!) 124/56 (!) 150/69 (!) 156/80 (!) 107/50  Pulse: (!) 121 (!) 129 (!) 136 (!) 133  Resp: (!) 29 (!) 46 (!) 52 (!) 46  Temp:  97.9 F (36.6 C)    TempSrc:  Oral    SpO2: 95% 96% 91% 99%  Weight:      Height:        Intake/Output Summary (Last 24 hours) at 01/20/2018 1040 Last data filed at 01/20/2018 1000 Gross per 24 hour  Intake 1186.25 ml  Output 1 ml  Net 1185.25 ml   Filed Weights   01/19/18 1720  01/20/18 0100 01/20/18 0337  Weight: 83.5 kg (184 lb) 83.3 kg (183 lb 10.3 oz) 83.3 kg (183 lb 10.3 oz)    General appearance: alert, cooperative, appears stated age, distracted and no distress Head: Normocephalic, without obvious abnormality, atraumatic Resp: Diminished air entry bilaterally left more than right base.  Dullness to percussion.  Few scattered wheezes and crackles.  Noted to be tachypneic even at rest. Cardio: S1-S2 is tachycardic regular.  No S3-S4.  No rubs murmurs or bruit GI: Abdomen is soft.  Mildly tender in the left side without any rebound rigidity or guarding.  No masses or organomegaly.  Bowel sounds are present  Extremities: extremities normal, atraumatic, no cyanosis or edema Pulses: 2+ and symmetric Neurologic: No focal neurological deficits.  Lab Results:  Data Reviewed: I have personally reviewed following labs and imaging studies  CBC: Recent Labs  Lab 01/19/18 1814 01/20/18 0117  WBC 15.5* 16.7*  NEUTROABS 14.8*  --   HGB 8.6* 9.0*  HCT 28.1* 29.8*  MCV 108.1* 108.0*  PLT 105* 105*    Basic Metabolic Panel: Recent Labs  Lab 01/19/18 1814 01/20/18 0109 01/20/18 0117  NA 142  --  142  K 2.8*  --  3.1*  CL 112*  --  111  CO2 20*  --  20*  GLUCOSE 135*  --  136*  BUN 17  --  17  CREATININE 0.93  --  0.95  CALCIUM 7.4*  --  7.5*  MG  --  2.3  --     GFR: Estimated Creatinine Clearance: 76 mL/min (by C-G formula based on SCr of 0.95 mg/dL).  Liver Function Tests: Recent Labs  Lab 01/19/18 1814 01/20/18 0117  AST 105* 125*  ALT 48 53  ALKPHOS 139* 136*  BILITOT 0.5 0.8  PROT 5.6* 5.8*  ALBUMIN 1.8* 1.8*    Coagulation Profile: Recent Labs  Lab 01/19/18 1814  INR 1.44    Thyroid Function Tests: Recent Labs    01/20/18 0113  TSH 5.701*     Recent Results (from the past 240 hour(s))  MRSA PCR Screening     Status: None   Collection Time: 01/20/18  1:10 AM  Result Value Ref Range Status   MRSA by PCR NEGATIVE  NEGATIVE Final    Comment:        The GeneXpert MRSA Assay (FDA approved for NASAL specimens only), is one component of a comprehensive MRSA colonization surveillance program. It is not intended to diagnose MRSA infection nor to guide or monitor treatment for MRSA infections. Performed at [facility], [place] [address] [place], [place] [address]   C difficile quick scan w PCR reflex     Status: Abnormal   Collection Time: 01/20/18  2:30 AM  Result Value Ref Range Status   C Diff antigen POSITIVE (A) NEGATIVE Final   C Diff toxin POSITIVE (A) NEGATIVE Final   C Diff interpretation Toxin producing C. difficile detected.  Final    Comment: CRITICAL RESULT CALLED TO, READ BACK BY AND VERIFIED WITH: K WILLING RN [phone] 01/20/18 A NAVARRO Performed at [facility], [place] [address], [place],  [address]       Radiology Studies: Dg Chest 2 View  Result Date: 01/19/2018 CLINICAL DATA:  Dyspnea and left-sided chest pain x3 days EXAM: CHEST  2 VIEW COMPARISON:  01/05/2018 CT and CXR FINDINGS: Right-sided PICC line tip terminates in the distal SVC. Heart size is normal. No aortic aneurysm is noted. Slight interval increase in moderate left pleural effusion. A small right pleural effusion is now noted as well. There is pulmonary vascular congestion. IMPRESSION: Slight interval increase in bilateral pleural effusions, left greater than right. Persistent left basilar consolidation. Stable PICC line position. Electronically Signed   By: Ashley Royalty M.D.   On: 01/19/2018 18:04   Ct Angio Chest Pe W And/or Wo Contrast  Result Date: 01/19/2018 CLINICAL DATA:  Shortness of breath with left-sided chest pain history of lung cancer EXAM: CT ANGIOGRAPHY CHEST WITH CONTRAST TECHNIQUE: Multidetector CT imaging of the chest was performed using the standard protocol during bolus administration of intravenous contrast. Multiplanar CT image reconstructions and MIPs were  obtained to evaluate the vascular anatomy. CONTRAST:  152mL ISOVUE-370 IOPAMIDOL (ISOVUE-370) INJECTION 76% COMPARISON:  Radiograph 01/19/2018, CT chest 01/05/2018, 12/14/2017, 11/24/2017, 09/20/2017, PET CT 10/05/2016 FINDINGS: Cardiovascular: Satisfactory opacification of the pulmonary arteries to the segmental level. No evidence of pulmonary embolism. Nonaneurysmal aorta. Mild aortic atherosclerosis. Vascular catheter tip in the distal SVC. Minimal coronary vascular calcification. Normal heart size. No large pericardial effusion Mediastinum/Nodes: Midline trachea. No thyroid mass. Probable left lobectomy. Increased upper mediastinal adenopathy. A prevascular lymph node measures 19 mm, compared with 17 mm previously. Lymph node adjacent to the arch measures 11 mm compared with 9 mm previously. Esophagus within normal limits. Lungs/Pleura: Moderate emphysema. Small right pleural effusion, new. Moderate to large left pleural effusion, increased. Left hilar infiltrative soft tissue mass/density is stable to minimally decreased.  Left infrahilar soft tissue mass is contiguous with dense consolidation in the left lower lobe. A central focal area of low attenuation within the consolidated left lower lobe may represent necrosis. Residual narrowing of left lower lobe bronchi by soft tissue mass/consolidation though with some improvement proximally. Some clearing of inflammatory infiltrates in the lingula, however multifocal nodular densities or nodules are evident within the lingula. A masslike density is present in the anterior left lung base but is contiguous with left lower lobe consolidation. Upper Abdomen: No acute abnormality. Musculoskeletal: Degenerative changes of the spine. No acute or suspicious bone lesion. Surgical hardware partially seen in the proximal left humerus. Review of the MIP images confirms the above findings. IMPRESSION: 1. Negative for acute pulmonary embolus. 2. Moderate to large left pleural  effusion, increased compared to prior. Continued dense left lower lobe pneumonia with new central low density in the consolidation possibly due to necrosis. Left lower lobe pneumonia is contiguous with ill-defined, infiltrative left hilar mass with narrowing of the left lower lobe bronchi. Multiple nodules or nodular foci of disease visualized within the lingula and anterior left lung base. 3. Increased mediastinal adenopathy since the most recent chest CT. 4. New right pleural effusion, small 5. Emphysema Aortic Atherosclerosis (ICD10-I70.0) and Emphysema (ICD10-J43.9). Electronically Signed   By: Donavan Foil M.D.   On: 01/19/2018 21:02     Medications:  Scheduled: . atorvastatin  20 mg Oral q1800  . Chlorhexidine Gluconate Cloth  6 each Topical Daily  . feeding supplement (ENSURE ENLIVE)  237 mL Oral BID BM  . guaiFENesin  600 mg Oral BID  . metoprolol tartrate  12.5 mg Oral BID  . mirtazapine  15 mg Oral QHS  . rivaroxaban  20 mg Oral q1800  . sodium chloride flush  10-40 mL Intracatheter Q12H  . sucralfate  1 g Oral TID WC & HS  . vancomycin  125 mg Oral Q6H   Continuous: . sodium chloride 75 mL/hr at 01/20/18 1000  . piperacillin-tazobactam (ZOSYN)  IV 3.375 g (01/20/18 0628)  . sodium chloride     ZOX:[id number] **OR** acetaminophen, ipratropium, levalbuterol, metoprolol tartrate, morphine injection, ondansetron **OR** ondansetron (ZOFRAN) IV, oxyCODONE, sodium chloride flush  Assessment/Plan:  Active Problems:   Hypertension   Hyperlipidemia   Adenocarcinoma of left lung, stage 4 (HCC)   Thrombocytopenia (HCC)   Pneumonia   Malnutrition of moderate degree   Postobstructive pneumonia   Elevated TSH   Tachycardia    Acute respiratory failure with hypoxia This is in the setting of stage IV lung cancer and postobstructive pneumonia and left-sided pleural effusion.  Patient's respiratory status is stable.  Thoracentesis discussed with the patient and he is not certain  he wants to undergo the procedure.  Patient has been seen by palliative medicine previously.  Discussed with Dr. Rowe Pavy who will evaluate patient.  No urgent indication for thoracentesis at this time.  C. difficile diarrhea Patient with multiple episodes of loose stool at home.  He has been in the hospital many times in the last 2 months.  He has been on antibiotics.  He was considered to be high risk for C. difficile infection.  Stool studies are positive.  Patient has been started on oral vancomycin.  Will try to stop his other antibiotics as soon as feasible.  Postobstructive pneumonia Noted on CT scan.  He has had some of these findings previously as well.  Denies any cough with purulent expectoration.  Unclear if the finding on the CT scan  is clinically significant.  MRSA PCR has been negative.  Stop intravenous vancomycin.  Continue Zosyn alone for now.  Stage IV non-small cell lung cancer Patient last chemotherapy was in December.  He has not been able to receive any further chemotherapy as he has been in the hospital every time that he was supposed to get it.  He has recently completed course of palliative radiation.  Palliative medicine to evaluate for further goals of care.  Dr. [name] has been notified.  Tachycardia Noted to be sinus tachycardia.  Could be due to volume depletion.  We will continue with IV fluids.  Given fluid bolus.  EKG shows sinus tachycardia.  Echocardiogram done earlier this year showed normal systolic function.  Hypokalemia Will be repleted.  Magnesium was normal.  Macrocytic anemia Hemoglobin close to baseline.  No evidence for bleeding.  Continue to monitor.  TSH noted to be mildly elevated but actually better than what it was a few weeks ago.  Free T4 normal at that time.  No need to initiate treatment.  History of DVT and PE Continue with Xarelto for now.  Mild transaminases  Follow trend.  Thrombocytopenia Platelet counts are stable.  Continue to  monitor.  DVT Prophylaxis: On Xarelto    Code Status: DNR Family Communication: Discussed with the patient and his wife Disposition Plan: Management as outlined above.  Prognosis is poor.    LOS: 1 day   [facility] Pager [phone] 01/20/2018, 10:40 AM  If 7PM-7AM, please contact night-coverage at [email or web address], password [facility]

## 2018-01-21 ENCOUNTER — Inpatient Hospital Stay (HOSPITAL_COMMUNITY): Payer: Medicare Other

## 2018-01-21 DIAGNOSIS — J9601 Acute respiratory failure with hypoxia: Secondary | ICD-10-CM

## 2018-01-21 DIAGNOSIS — J918 Pleural effusion in other conditions classified elsewhere: Secondary | ICD-10-CM

## 2018-01-21 DIAGNOSIS — J181 Lobar pneumonia, unspecified organism: Secondary | ICD-10-CM

## 2018-01-21 DIAGNOSIS — E876 Hypokalemia: Secondary | ICD-10-CM

## 2018-01-21 DIAGNOSIS — C3432 Malignant neoplasm of lower lobe, left bronchus or lung: Secondary | ICD-10-CM

## 2018-01-21 LAB — BASIC METABOLIC PANEL
Anion gap: 10 (ref 5–15)
BUN: 23 mg/dL — AB (ref 6–20)
CO2: 19 mmol/L — ABNORMAL LOW (ref 22–32)
CREATININE: 1.17 mg/dL (ref 0.61–1.24)
Calcium: 7.7 mg/dL — ABNORMAL LOW (ref 8.9–10.3)
Chloride: 113 mmol/L — ABNORMAL HIGH (ref 101–111)
Glucose, Bld: 129 mg/dL — ABNORMAL HIGH (ref 65–99)
Potassium: 2.8 mmol/L — ABNORMAL LOW (ref 3.5–5.1)
SODIUM: 142 mmol/L (ref 135–145)

## 2018-01-21 LAB — CBC
HCT: 30.5 % — ABNORMAL LOW (ref 39.0–52.0)
Hemoglobin: 9.1 g/dL — ABNORMAL LOW (ref 13.0–17.0)
MCH: 31.4 pg (ref 26.0–34.0)
MCHC: 29.8 g/dL — AB (ref 30.0–36.0)
MCV: 105.2 fL — ABNORMAL HIGH (ref 78.0–100.0)
PLATELETS: 107 10*3/uL — AB (ref 150–400)
RBC: 2.9 MIL/uL — ABNORMAL LOW (ref 4.22–5.81)
RDW: 21 % — ABNORMAL HIGH (ref 11.5–15.5)
WBC: 14.9 10*3/uL — ABNORMAL HIGH (ref 4.0–10.5)

## 2018-01-21 LAB — BLOOD CULTURE ID PANEL (REFLEXED)
ACINETOBACTER BAUMANNII: NOT DETECTED
Candida albicans: NOT DETECTED
Candida glabrata: NOT DETECTED
Candida krusei: NOT DETECTED
Candida parapsilosis: NOT DETECTED
Candida tropicalis: NOT DETECTED
ENTEROCOCCUS SPECIES: NOT DETECTED
Enterobacter cloacae complex: NOT DETECTED
Enterobacteriaceae species: NOT DETECTED
Escherichia coli: NOT DETECTED
HAEMOPHILUS INFLUENZAE: NOT DETECTED
Klebsiella oxytoca: NOT DETECTED
Klebsiella pneumoniae: NOT DETECTED
LISTERIA MONOCYTOGENES: NOT DETECTED
METHICILLIN RESISTANCE: DETECTED — AB
NEISSERIA MENINGITIDIS: NOT DETECTED
Proteus species: NOT DETECTED
Pseudomonas aeruginosa: NOT DETECTED
SERRATIA MARCESCENS: NOT DETECTED
STAPHYLOCOCCUS AUREUS BCID: NOT DETECTED
STREPTOCOCCUS AGALACTIAE: NOT DETECTED
STREPTOCOCCUS SPECIES: NOT DETECTED
Staphylococcus species: DETECTED — AB
Streptococcus pneumoniae: NOT DETECTED
Streptococcus pyogenes: NOT DETECTED

## 2018-01-21 MED ORDER — POTASSIUM CHLORIDE CRYS ER 20 MEQ PO TBCR: 40.0000 meq | EXTENDED_RELEASE_TABLET | Freq: Once | ORAL | Status: DC

## 2018-01-21 MED ORDER — LIDOCAINE HCL 1 % IJ SOLN
INTRAMUSCULAR | Status: AC
  Administered 2018-01-21: 17:00:00
  Filled 2018-01-21: qty 20

## 2018-01-21 MED ORDER — POTASSIUM CHLORIDE 10 MEQ/50ML IV SOLN
10.0000 meq | INTRAVENOUS | Status: AC
  Administered 2018-01-21 (×4): 10 meq via INTRAVENOUS
  Filled 2018-01-21 (×8): qty 50

## 2018-01-21 MED ORDER — CHLOROPROCAINE HCL 1 % IJ SOLN
30.0000 mL | Freq: Once | INTRAMUSCULAR | Status: AC
  Administered 2018-01-21: 30 mL
  Filled 2018-01-21: qty 30

## 2018-01-21 MED ORDER — POTASSIUM CHLORIDE CRYS ER 20 MEQ PO TBCR
40.0000 meq | EXTENDED_RELEASE_TABLET | Freq: Once | ORAL | Status: AC
  Administered 2018-01-21: 40 meq via ORAL
  Filled 2018-01-21: qty 2

## 2018-01-21 NOTE — Procedures (Signed)
Ultrasound-guided  therapeutic left thoracentesis performed yielding 1.4 iters of bloody fluid. No immediate complications. Follow-up chest x-ray pending.

## 2018-01-21 NOTE — Progress Notes (Signed)
Daily Progress Note   Patient Name: Nicholas Hale       Date: 01/21/2018 DOB: 07-25-1946  Age: 72 y.o. MRN#: [id number] Attending Physician: Bonnielee Haff, MD Primary Care Physician: [name], MD Admit Date: 01/19/2018  Reason for Consultation/Follow-up: Establishing goals of care  Subjective:  Patient is resting in bed, he opens his eyes, answers a few questions appropriately He is in mild to moderate distress, with shortness of breath Wife at bedside  See below  Length of Stay: 2  Current Medications: Scheduled Meds:  . Chlorhexidine Gluconate Cloth  6 each Topical Daily  . feeding supplement  1 Container Oral Q24H  . feeding supplement (ENSURE ENLIVE)  237 mL Oral BID BM  . guaiFENesin  600 mg Oral BID  . metoprolol tartrate  12.5 mg Oral BID  . mirtazapine  15 mg Oral QHS  . multivitamin with minerals  1 tablet Oral Daily  . potassium chloride  40 mEq Oral Once  . rivaroxaban  20 mg Oral q1800  . sodium chloride flush  10-40 mL Intracatheter Q12H  . sucralfate  1 g Oral TID WC & HS  . vancomycin  125 mg Oral Q6H    Continuous Infusions: . sodium chloride 75 mL/hr at 01/21/18 0537  . piperacillin-tazobactam (ZOSYN)  IV Stopped (01/21/18 0936)  . potassium chloride Stopped (01/21/18 1021)    PRN Meds: acetaminophen **OR** acetaminophen, ipratropium, levalbuterol, metoprolol tartrate, morphine injection, ondansetron **OR** ondansetron (ZOFRAN) IV, oxyCODONE, sodium chloride flush  Physical Exam          General appearance: alert,  fatigued and mild distress Resp: diminished breath sounds  Cardio: regular rate and rhythm, S1, S2 normal,   GI: soft, non-tender; bowel sounds normal; no masses,  no organomegaly Extremities: has some edema Appears weak,  appears chronically ill   Vital Signs: BP (!) 146/53   Pulse (!) 119   Temp 98.6 F (37 C) (Oral)   Resp (!) 31   Ht 5\' 11"  (1.803 m)   Wt 83 kg (182 lb 15.7 oz)   SpO2 96%   BMI 25.52 kg/m  SpO2: SpO2: 96 % O2 Device: O2 Device: Nasal Cannula O2 Flow Rate: O2 Flow Rate (L/min): 2 L/min  Intake/output summary:   Intake/Output Summary (Last 24 hours) at 01/21/2018 0349 Last data filed  at 01/21/2018 0231 Gross per 24 hour  Intake 1350 ml  Output 400 ml  Net 950 ml   LBM: Last BM Date: 01/12/18 Baseline Weight: Weight: 83.5 kg (184 lb) Most recent weight: Weight: 83 kg (182 lb 15.7 oz)       Palliative Assessment/Data: PPS 30%   Flowsheet Rows     Most Recent Value  Intake Tab  Referral Department  Hospitalist  Unit at Time of Referral  ICU  Palliative Care Primary Diagnosis  Cancer  Palliative Care Type  Return patient Palliative Care  Reason for referral  Non-pain Symptom, End of Life Care Assistance, Counsel Regarding Hospice, Clarify Goals of Care  Date first seen by Palliative Care  01/20/18  Clinical Assessment  Palliative Performance Scale Score  20%  Pain Max last 24 hours  4  Pain Min Last 24 hours  3  Dyspnea Max Last 24 Hours  6  Dyspnea Min Last 24 hours  4  Nausea Max Last 24 Hours  2  Nausea Min Last 24 Hours  1  Anxiety Max Last 24 Hours  4  Anxiety Min Last 24 Hours  3  Psychosocial & Spiritual Assessment  Palliative Care Outcomes  Patient/Family meeting held?  Yes  Who was at the meeting?  patient(who is confused and no longer decisional) son at bedside, called wife over the phone.       Patient Active Problem List   Diagnosis Date Noted  . C. difficile diarrhea 01/20/2018  . Tachycardia 01/19/2018  . Encounter for palliative care   . Hypokalemia   . Elevated TSH   . Postobstructive pneumonia 01/05/2018  . Sepsis due to pneumonia ([place]) 01/05/2018  . 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 Staphylococcus aureus bacteremia   .  Bacteremia   . Malnutrition of moderate degree 12/17/2017  . Pneumonia 12/14/2017  . Leukocytosis 12/14/2017  . Anemia 12/14/2017  . Hyponatremia 12/14/2017  . HCAP (healthcare-associated pneumonia)   . Influenza vaccination administered at current visit 09/02/2017  . Gingival bleeding 08/21/2017  . Thrombocytopenia ([place]) 08/21/2017  . Dyspnea on exertion 04/05/2017  . Chemotherapy induced neutropenia ([place]) 03/31/2017  . Port catheter in place 03/03/2017  . Encounter for antineoplastic chemotherapy 02/17/2017  . Adenocarcinoma of left lung, stage 4 ([place]) 10/05/2016  . Goals of care, counseling/discussion 10/05/2016  . Chronic fatigue 10/05/2016  . Lung cancer ([place]) 09/25/2016  . Acute deep vein thrombosis (DVT) of proximal vein of both lower extremities (HCC)   . Cardiac tamponade   . Elevated troponin 09/20/2016  . 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 Ventricular tachycardia ([place]) 09/19/2016  . Renal cyst, right 09/19/2016  . Liver lesion 09/19/2016  . Nonsustained ventricular tachycardia ([place])   . History of tobacco abuse 01/28/2016  . Primary osteoarthritis of right hip 05/10/2015  . 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 Arthritis of left hip 05/23/2014  . Hypertension 05/15/2014  . Hyperlipidemia 05/15/2014    Palliative Care Assessment & Plan   Patient Profile:    Assessment:  metastatic non-small cell lung cancer, adenocarcinoma status post several chemotherapy regimens including immunotherapy as well as palliative radiation. Unfortunately the patient continues to have disease progression despite radiation and chemotherapy. Existential distress Shortness of breath Generalized discomort cough Recommendations/Plan:   DNR DNI  Begin directing efforts towards comfort measures  IV Morphine PRN  PO Oxy IR PRN  Will D/C routine labs and non essential PO medications at this time.  CSW consulted for residential hospice  placement  Choices discussed, wife prefers [facility]. Discussed in detail with her about the type of care that is provided at a residential hospice setting. All of her questions answered to the best of my ability.    Code Status:    Code Status Orders  (From admission, onward)        Start     Ordered   01/20/18 0109  Do not attempt resuscitation (DNR)  Continuous    Question Answer Comment  In the event of cardiac or respiratory ARREST Do not call a "code blue"   In the event of cardiac or respiratory ARREST Do not perform Intubation, CPR, defibrillation or ACLS   In the event of cardiac or respiratory ARREST Use medication by any route, position, wound care, and other measures to relive pain and suffering. May use oxygen, suction and manual treatment of airway obstruction as needed for comfort.   Comments Confirmed with patient and family 2/6      01/20/18 0108    Code Status History    Date Active Date Inactive Code Status Order ID Comments User Context   01/05/2018 20:08 01/13/2018 14:53 DNR [id number]  Martyn Malay, RN Inpatient   01/05/2018 17:24 01/05/2018 19:59 DNR [id number]  Patrecia Pour, MD Inpatient   12/15/2017 08:19 12/24/2017 17:37 Full Code [id number]  Jani Gravel, MD ED   08/21/2017 13:39 08/22/2017 17:20 Full Code [id number]  Truett Mainland, DO ED   02/01/2017 15:52 02/02/2017 03:10 Full Code [id number]  Arne Cleveland, MD HOV   09/19/2016 22:36 09/23/2016 16:45 Full Code [id number]  Ivor Costa, MD Inpatient   05/15/2015 17:53 05/17/2015 13:50 Full Code [id number]  Frederik Pear, MD Inpatient   08/07/2014 10:00 08/08/2014 14:40 Full Code [id number]  Wilhelmina Mcardle, MD Inpatient   08/06/2014 14:51 08/07/2014 10:00 Full Code [id number]  Sandi Mariscal, MD Inpatient   08/05/2014 20:48 08/06/2014 14:51 Full Code [id number]  Marybelle Killings, MD Inpatient   08/05/2014 17:30 08/05/2014 20:48 Full Code [id number]  Doree Fudge, MD ED   05/23/2014 15:27 05/25/2014 17:18 Full Code [id number]  Kerin Salen, MD Inpatient    Advance Directive Documentation     Most Recent Value  Type of Advance Directive  Healthcare Power of Attorney  Pre-existing out of facility DNR order (yellow form or pink MOST form)  No data  "MOST" Form in Place?  No data       Prognosis:   < 2 weeks  Discharge Planning:  Hospice facility  Care plan was discussed with patient, wife, bedside RN.   Thank you for allowing the Palliative Medicine Team to assist in the care of this patient.   Time In: 9 Time Out: 9.35 Total Time 35 Prolonged Time Billed  no       Greater than 50%  of this time was spent counseling and coordinating care related to the above assessment and plan.  Loistine Chance, MD [phone]  Please contact Palliative Medicine Team phone at [phone] for questions and concerns.

## 2018-01-21 NOTE — Progress Notes (Signed)
[facility] Liaison: ? Received a request from Maudie Mercury, [place] for family interest in [facility]. Chart reviwed. Met with wife, Enid Derry to confirm interest and explain services. Family agreeable to transfer tomorrow.  Registration paperwork completed.  Please fax discharge summary to [phone].?  RN please call report to [phone].  Please arrange for transportation before noon if possible.  Thank You, Freddi Starr RN, [facility] Liaison [phone]

## 2018-01-21 NOTE — Clinical Social Work Note (Signed)
Clinical Social Work Assessment  Patient Details  Name: Nicholas Hale MRN: [id number] Date of Birth: [birth year]  Date of referral:  01/21/18               Reason for consult:  End of Life/Hospice                Permission sought to share information with:  Other([facility]) Permission granted to share information::  Yes, Verbal Permission Granted  Name::        Agency::     Relationship::     Contact Information:     Housing/Transportation Living arrangements for the past 2 months:  Single Family Home Source of Information:  Spouse Patient Interpreter Needed:  None Criminal Activity/Legal Involvement Pertinent to Current Situation/Hospitalization:  No - Comment as needed Significant Relationships:  Spouse, Adult Children Lives with:  Spouse Do you feel safe going back to the place where you live?    Need for family participation in patient care:  Yes (Comment)  Care giving concerns: Patient from home with wife. Palliative following, CSW consulted for residential hospice.   Social Worker assessment / plan:  CSW spoke with patient/patient's wife at bedside regarding interest in residential hospice. Patient's wife confirmed plan to discharge to residential hospice. Patient's wife reported that they are interested in [facility], [place] agreed to make referral.  CSW made referral to [facility] RN liaison Amy. [facility] RN liaison agreed to submit referral.   CSW will continue to follow and assist with discharge planning.  Employment status:  Retired Forensic scientist:  Medicare PT Recommendations:  Not assessed at this time [place] / Referral to community resources:  Other (Comment Required)(Residential hospice)  Patient/Family's Response to care:  Patient's wife appreciative of CSW assistance with referral to [facility].  Patient/Family's Understanding of and Emotional Response to Diagnosis, Current Treatment, and Prognosis:  Patient presented  quiet and patient's wife spoke with CSW. Patient's wife verbalized plan for patient to discharge to residential hospice. Patient inquired what was taking so Kartier Bennison, patient's wife explained to patient that hospice had a limited number of beds. CSW provided emotional support and informed patient/patient's wife that CSW is available if needed.   Emotional Assessment Appearance:  Appears stated age Attitude/Demeanor/Rapport:    Affect (typically observed):  Quiet Orientation:  Oriented to Self, Oriented to Place, Oriented to  Time Alcohol / Substance use:  Not Applicable Psych involvement (Current and /or in the community):  No (Comment)  Discharge Needs  Concerns to be addressed:  Care Coordination Readmission within the last 30 days:  Yes Current discharge risk:  Terminally ill Barriers to Discharge:  Continued Medical Work up   [employer], LCSW 01/21/2018, 2:13 PM

## 2018-01-21 NOTE — Progress Notes (Signed)
Subjective: The patient is seen and examined today.  His wife is at the bedside.  The patient continues to have significant shortness of breath.  He denied having any current fever or chills.  He was admitted to the hospital again with shortness of breath.  He was undergoing palliative radiotherapy.  He has no nausea or vomiting.  Repeat imaging studies showed enlarging left pleural effusion as well as worsening of his disease in the chest.  Objective: Vital signs in last 24 hours: Temp:  [97.9 F (36.6 C)-101.1 F (38.4 C)] 98.6 F (37 C) (02/22 0800) Pulse Rate:  [118-136] 119 (02/22 0700) Resp:  [21-45] 31 (02/22 0700) BP: (84-146)/(29-79) 146/53 (02/22 0700) SpO2:  [90 %-100 %] 96 % (02/22 0700) Weight:  [182 lb 15.7 oz (83 kg)] 182 lb 15.7 oz (83 kg) (02/22 0444)  Intake/Output from previous day: 02/21 0701 - 02/22 0700 In: 1500 [I.V.:1350; IV Piggyback:150] Out: 401 [Stool:401] Intake/Output this shift: No intake/output data recorded.  General appearance: alert, cooperative, fatigued and mild distress Resp: diminished breath sounds LLL and dullness to percussion LLL Cardio: regular rate and rhythm, S1, S2 normal, no murmur, click, rub or gallop GI: soft, non-tender; bowel sounds normal; no masses,  no organomegaly Extremities: extremities normal, atraumatic, no cyanosis or edema  Lab Results:  Recent Labs    01/20/18 0117 01/21/18 0535  WBC 16.7* 14.9*  HGB 9.0* 9.1*  HCT 29.8* 30.5*  PLT 105* 107*   BMET Recent Labs    01/20/18 0117 01/21/18 0535  NA 142 142  K 3.1* 2.8*  CL 111 113*  CO2 20* 19*  GLUCOSE 136* 129*  BUN 17 23*  CREATININE 0.95 1.17  CALCIUM 7.5* 7.7*    Studies/Results: Dg Chest 2 View  Result Date: 01/19/2018 CLINICAL DATA:  Dyspnea and left-sided chest pain x3 days EXAM: CHEST  2 VIEW COMPARISON:  01/05/2018 CT and CXR FINDINGS: Right-sided PICC line tip terminates in the distal SVC. Heart size is normal. No aortic aneurysm is noted.  Slight interval increase in moderate left pleural effusion. A small right pleural effusion is now noted as well. There is pulmonary vascular congestion. IMPRESSION: Slight interval increase in bilateral pleural effusions, left greater than right. Persistent left basilar consolidation. Stable PICC line position. Electronically Signed   By: Ashley Royalty M.D.   On: 01/19/2018 18:04   Ct Angio Chest Pe W And/or Wo Contrast  Result Date: 01/19/2018 CLINICAL DATA:  Shortness of breath with left-sided chest pain history of lung cancer EXAM: CT ANGIOGRAPHY CHEST WITH CONTRAST TECHNIQUE: Multidetector CT imaging of the chest was performed using the standard protocol during bolus administration of intravenous contrast. Multiplanar CT image reconstructions and MIPs were obtained to evaluate the vascular anatomy. CONTRAST:  150mL ISOVUE-370 IOPAMIDOL (ISOVUE-370) INJECTION 76% COMPARISON:  Radiograph 01/19/2018, CT chest 01/05/2018, 12/14/2017, 11/24/2017, 09/20/2017, PET CT 10/05/2016 FINDINGS: Cardiovascular: Satisfactory opacification of the pulmonary arteries to the segmental level. No evidence of pulmonary embolism. Nonaneurysmal aorta. Mild aortic atherosclerosis. Vascular catheter tip in the distal SVC. Minimal coronary vascular calcification. Normal heart size. No large pericardial effusion Mediastinum/Nodes: Midline trachea. No thyroid mass. Probable left lobectomy. Increased upper mediastinal adenopathy. A prevascular lymph node measures 19 mm, compared with 17 mm previously. Lymph node adjacent to the arch measures 11 mm compared with 9 mm previously. Esophagus within normal limits. Lungs/Pleura: Moderate emphysema. Small right pleural effusion, new. Moderate to large left pleural effusion, increased. Left hilar infiltrative soft tissue mass/density is stable to minimally decreased.  Left infrahilar soft tissue mass is contiguous with dense consolidation in the left lower lobe. A central focal area of low  attenuation within the consolidated left lower lobe may represent necrosis. Residual narrowing of left lower lobe bronchi by soft tissue mass/consolidation though with some improvement proximally. Some clearing of inflammatory infiltrates in the lingula, however multifocal nodular densities or nodules are evident within the lingula. A masslike density is present in the anterior left lung base but is contiguous with left lower lobe consolidation. Upper Abdomen: No acute abnormality. Musculoskeletal: Degenerative changes of the spine. No acute or suspicious bone lesion. Surgical hardware partially seen in the proximal left humerus. Review of the MIP images confirms the above findings. IMPRESSION: 1. Negative for acute pulmonary embolus. 2. Moderate to large left pleural effusion, increased compared to prior. Continued dense left lower lobe pneumonia with new central low density in the consolidation possibly due to necrosis. Left lower lobe pneumonia is contiguous with ill-defined, infiltrative left hilar mass with narrowing of the left lower lobe bronchi. Multiple nodules or nodular foci of disease visualized within the lingula and anterior left lung base. 3. Increased mediastinal adenopathy since the most recent chest CT. 4. New right pleural effusion, small 5. Emphysema Aortic Atherosclerosis (ICD10-I70.0) and Emphysema (ICD10-J43.9). Electronically Signed   By: Donavan Foil M.D.   On: 01/19/2018 21:02    Medications: I have reviewed the patient's current medications.  CODE STATUS: No CODE BLUE  Assessment/Plan: This is a very pleasant 72 years old white male with metastatic non-small cell lung cancer, adenocarcinoma status post several chemotherapy regimens including immunotherapy as well as palliative radiation. Unfortunately the patient continues to have disease progression despite radiation and chemotherapy. I had a lengthy discussion with the patient and his wife today about his condition.  I  recommended for him to consider palliative care and hospice at this point.  The patient and his wife are in agreement with the current plan.  He is expected to be discharged to be can place for end-of-life care. For the worsening dyspnea and large left pleural effusion, I will arrange for the patient to have ultrasound-guided therapeutic left thoracentesis before discharge for comfort. For pain management, he will continue with the current pain medications. Thank you for taking good care of Mr. Cahalan, I will continue to follow-up the patient with you and assist in his management on as-needed basis.  I would also be happy to take care of his hospice needs after discharge.   LOS: 2 days    Eilleen Kempf 01/21/2018

## 2018-01-21 NOTE — Progress Notes (Signed)
[facility] PROGRESS NOTE  Nicholas Hale TTS:[id number] DOB: [birth year] DOA: 01/19/2018  PCP: [name], MD  Brief History/Interval Summary: 72 year old Caucasian male with past medical history significant for stage IV non-small cell lung cancer followed by Dr. [name] recently completed palliative radiation.  He has had 2 hospitalizations in January and then in February for postobstructive pneumonia, MSSA bacteremia.  He also has a history of PE and DVT on Xarelto.  Comes in with complains of generalized weakness, fatigue, elevated heart rate.  There was evidence for worsening left-sided pleural effusion and continued left lower lobe pneumonia.  He was hospitalized for further management.  Subsequently found to have C. difficile.  Reason for Visit: Acute respiratory failure with hypoxia  Consultants: Palliative medicine.  Oncology.  Procedures: None yet  Antibiotics: Started on vancomycin and Zosyn.  IV Vancomycin discontinued. Started on oral vancomycin  Subjective/Interval History: Patient mentions that he feels about the same.  Remain short of breath but no worsening.  No worsening abdominal pain.  His wife is at the bedside.  Continues to have loose stools.  He has a rectal tube in place.  ROS: Denies any nausea or vomiting  Objective:  Vital Signs  Vitals:   01/21/18 0325 01/21/18 0400 01/21/18 0444 01/21/18 0700  BP: (!) 128/46 (!) 111/51  (!) 146/53  Pulse: (!) 120 (!) 123  (!) 119  Resp: (!) 37 (!) 37  (!) 31  Temp:  98.1 F (36.7 C)    TempSrc:  Oral    SpO2: 98% 96%  96%  Weight:   83 kg (182 lb 15.7 oz)   Height:        Intake/Output Summary (Last 24 hours) at 01/21/2018 0755 Last data filed at 01/21/2018 0231 Gross per 24 hour  Intake 1500 ml  Output 401 ml  Net 1099 ml   Filed Weights   01/20/18 0100 01/20/18 0337 01/21/18 0444  Weight: 83.3 kg (183 lb 10.3 oz) 83.3 kg (183 lb 10.3 oz) 83 kg (182 lb 15.7 oz)    General appearance:  Awake alert.  In no distress.  Mild discomfort. Resp: Diminished air entry bilaterally, left more than right.  Scattered wheezes.  No definite rhonchi.  Noted to be tachypneic even at rest.   Cardio: S1-S2 remains tachycardic regular.  No S3-S4.  No rubs murmurs of bruit. GI: Abdomen remains soft.  Mildly tender in the left lower quadrant without any rebound rigidity or guarding.   Extremities: No edema Neurologic: Mildly distracted but no obvious focal neurological deficits.  Lab Results:  Data Reviewed: I have personally reviewed following labs and imaging studies  CBC: Recent Labs  Lab 01/19/18 1814 01/20/18 0117 01/21/18 0535  WBC 15.5* 16.7* 14.9*  NEUTROABS 14.8*  --   --   HGB 8.6* 9.0* 9.1*  HCT 28.1* 29.8* 30.5*  MCV 108.1* 108.0* 105.2*  PLT 105* 105* 107*    Basic Metabolic Panel: Recent Labs  Lab 01/19/18 1814 01/20/18 0109 01/20/18 0117 01/21/18 0535  NA 142  --  142 142  K 2.8*  --  3.1* 2.8*  CL 112*  --  111 113*  CO2 20*  --  20* 19*  GLUCOSE 135*  --  136* 129*  BUN 17  --  17 23*  CREATININE 0.93  --  0.95 1.17  CALCIUM 7.4*  --  7.5* 7.7*  MG  --  2.3  --   --     GFR: Estimated Creatinine Clearance: 61.7  mL/min (by C-G formula based on SCr of 1.17 mg/dL).  Liver Function Tests: Recent Labs  Lab 01/19/18 1814 01/20/18 0117  AST 105* 125*  ALT 48 53  ALKPHOS 139* 136*  BILITOT 0.5 0.8  PROT 5.6* 5.8*  ALBUMIN 1.8* 1.8*    Coagulation Profile: Recent Labs  Lab 01/19/18 1814  INR 1.44    Thyroid Function Tests: Recent Labs    01/20/18 0113  TSH 5.701*     Recent Results (from the past 240 hour(s))  Culture, blood (Routine x 2)     Status: None (Preliminary result)   Collection Time: 01/19/18  6:14 PM  Result Value Ref Range Status   Specimen Description   Final    BLOOD Performed at [facility], [place] [address] [place], [place] [address]    Special Requests   Final    BOTTLES DRAWN AEROBIC AND  ANAEROBIC Blood Culture adequate volume Performed at [place] [address], [place], [place] [address]    Culture  Setup Time   Final    GRAM POSITIVE COCCI ANAEROBIC BOTTLE ONLY Organism ID to follow CRITICAL RESULT CALLED TO, READ BACK BY AND VERIFIED WITH: B GREEN PHARMD 01/21/18 0406 JDW    Culture   Final    NO GROWTH < 24 HOURS Performed at [facility], [place] [address] [place], [place] [address]    Report Status PENDING  Incomplete  Blood Culture ID Panel (Reflexed)     Status: Abnormal   Collection Time: 01/19/18  6:14 PM  Result Value Ref Range Status   Enterococcus species NOT DETECTED NOT DETECTED Final   Listeria monocytogenes NOT DETECTED NOT DETECTED Final   Staphylococcus species DETECTED (A) NOT DETECTED Final    Comment: Methicillin (oxacillin) resistant coagulase negative staphylococcus. Possible blood culture contaminant (unless isolated from more than one blood culture draw or clinical case suggests pathogenicity). No antibiotic treatment is indicated for blood  culture contaminants. CRITICAL RESULT CALLED TO, READ BACK BY AND VERIFIED WITH: B GREEN PHARMD 01/21/18 0406 JDW    Staphylococcus aureus NOT DETECTED NOT DETECTED Final   Methicillin resistance DETECTED (A) NOT DETECTED Final    Comment: CRITICAL RESULT CALLED TO, READ BACK BY AND VERIFIED WITH: B GREEN PHARMD 01/21/18 0406 JDW    Streptococcus species NOT DETECTED NOT DETECTED Final   Streptococcus agalactiae NOT DETECTED NOT DETECTED Final   Streptococcus pneumoniae NOT DETECTED NOT DETECTED Final   Streptococcus pyogenes NOT DETECTED NOT DETECTED Final   Acinetobacter baumannii NOT DETECTED NOT DETECTED Final   Enterobacteriaceae species NOT DETECTED NOT DETECTED Final   Enterobacter cloacae complex NOT DETECTED NOT DETECTED Final   Escherichia coli NOT DETECTED NOT DETECTED Final   Klebsiella oxytoca NOT DETECTED NOT DETECTED Final   Klebsiella pneumoniae NOT  DETECTED NOT DETECTED Final   Proteus species NOT DETECTED NOT DETECTED Final   Serratia marcescens NOT DETECTED NOT DETECTED Final   Haemophilus influenzae NOT DETECTED NOT DETECTED Final   Neisseria meningitidis NOT DETECTED NOT DETECTED Final   Pseudomonas aeruginosa NOT DETECTED NOT DETECTED Final   Candida albicans NOT DETECTED NOT DETECTED Final   Candida glabrata NOT DETECTED NOT DETECTED Final   Candida krusei NOT DETECTED NOT DETECTED Final   Candida parapsilosis NOT DETECTED NOT DETECTED Final   Candida tropicalis NOT DETECTED NOT DETECTED Final    Comment: Performed at [facility], [place]. [address] [place], [place] [address]  MRSA PCR Screening  Status: None   Collection Time: 01/20/18  1:10 AM  Result Value Ref Range Status   MRSA by PCR NEGATIVE NEGATIVE Final    Comment:        The GeneXpert MRSA Assay (FDA approved for NASAL specimens only), is one component of a comprehensive MRSA colonization surveillance program. It is not intended to diagnose MRSA infection nor to guide or monitor treatment for MRSA infections. Performed at [facility], [place] [address], [place], [place] [address]   C difficile quick scan w PCR reflex     Status: Abnormal   Collection Time: 01/20/18  2:30 AM  Result Value Ref Range Status   C Diff antigen POSITIVE (A) NEGATIVE Final   C Diff toxin POSITIVE (A) NEGATIVE Final   C Diff interpretation Toxin producing C. difficile detected.  Final    Comment: CRITICAL RESULT CALLED TO, READ BACK BY AND VERIFIED WITH: K WILLING RN [phone] 01/20/18 A NAVARRO Performed at [facility], [place] [address] [place], [place] [address]       Radiology Studies: Dg Chest 2 View  Result Date: 01/19/2018 CLINICAL DATA:  Dyspnea and left-sided chest pain x3 days EXAM: CHEST  2 VIEW COMPARISON:  01/05/2018 CT and CXR FINDINGS: Right-sided PICC line tip terminates in the distal SVC. Heart size is normal. No aortic  aneurysm is noted. Slight interval increase in moderate left pleural effusion. A small right pleural effusion is now noted as well. There is pulmonary vascular congestion. IMPRESSION: Slight interval increase in bilateral pleural effusions, left greater than right. Persistent left basilar consolidation. Stable PICC line position. Electronically Signed   By: Ashley Royalty M.D.   On: 01/19/2018 18:04   Ct Angio Chest Pe W And/or Wo Contrast  Result Date: 01/19/2018 CLINICAL DATA:  Shortness of breath with left-sided chest pain history of lung cancer EXAM: CT ANGIOGRAPHY CHEST WITH CONTRAST TECHNIQUE: Multidetector CT imaging of the chest was performed using the standard protocol during bolus administration of intravenous contrast. Multiplanar CT image reconstructions and MIPs were obtained to evaluate the vascular anatomy. CONTRAST:  152mL ISOVUE-370 IOPAMIDOL (ISOVUE-370) INJECTION 76% COMPARISON:  Radiograph 01/19/2018, CT chest 01/05/2018, 12/14/2017, 11/24/2017, 09/20/2017, PET CT 10/05/2016 FINDINGS: Cardiovascular: Satisfactory opacification of the pulmonary arteries to the segmental level. No evidence of pulmonary embolism. Nonaneurysmal aorta. Mild aortic atherosclerosis. Vascular catheter tip in the distal SVC. Minimal coronary vascular calcification. Normal heart size. No large pericardial effusion Mediastinum/Nodes: Midline trachea. No thyroid mass. Probable left lobectomy. Increased upper mediastinal adenopathy. A prevascular lymph node measures 19 mm, compared with 17 mm previously. Lymph node adjacent to the arch measures 11 mm compared with 9 mm previously. Esophagus within normal limits. Lungs/Pleura: Moderate emphysema. Small right pleural effusion, new. Moderate to large left pleural effusion, increased. Left hilar infiltrative soft tissue mass/density is stable to minimally decreased. Left infrahilar soft tissue mass is contiguous with dense consolidation in the left lower lobe. A central focal  area of low attenuation within the consolidated left lower lobe may represent necrosis. Residual narrowing of left lower lobe bronchi by soft tissue mass/consolidation though with some improvement proximally. Some clearing of inflammatory infiltrates in the lingula, however multifocal nodular densities or nodules are evident within the lingula. A masslike density is present in the anterior left lung base but is contiguous with left lower lobe consolidation. Upper Abdomen: No acute abnormality. Musculoskeletal: Degenerative changes of the spine. No acute or suspicious bone lesion. Surgical hardware partially seen in the proximal left humerus.  Review of the MIP images confirms the above findings. IMPRESSION: 1. Negative for acute pulmonary embolus. 2. Moderate to large left pleural effusion, increased compared to prior. Continued dense left lower lobe pneumonia with new central low density in the consolidation possibly due to necrosis. Left lower lobe pneumonia is contiguous with ill-defined, infiltrative left hilar mass with narrowing of the left lower lobe bronchi. Multiple nodules or nodular foci of disease visualized within the lingula and anterior left lung base. 3. Increased mediastinal adenopathy since the most recent chest CT. 4. New right pleural effusion, small 5. Emphysema Aortic Atherosclerosis (ICD10-I70.0) and Emphysema (ICD10-J43.9). Electronically Signed   By: Donavan Foil M.D.   On: 01/19/2018 21:02     Medications:  Scheduled: . atorvastatin  20 mg Oral q1800  . Chlorhexidine Gluconate Cloth  6 each Topical Daily  . feeding supplement  1 Container Oral Q24H  . feeding supplement (ENSURE ENLIVE)  237 mL Oral BID BM  . guaiFENesin  600 mg Oral BID  . metoprolol tartrate  12.5 mg Oral BID  . mirtazapine  15 mg Oral QHS  . multivitamin with minerals  1 tablet Oral Daily  . potassium chloride  40 mEq Oral Once  . rivaroxaban  20 mg Oral q1800  . sodium chloride flush  10-40 mL  Intracatheter Q12H  . sucralfate  1 g Oral TID WC & HS  . vancomycin  125 mg Oral Q6H   Continuous: . sodium chloride 75 mL/hr at 01/21/18 0537  . piperacillin-tazobactam (ZOSYN)  IV 3.375 g (01/21/18 0536)  . potassium chloride     WPY:[id number] **OR** acetaminophen, ipratropium, levalbuterol, metoprolol tartrate, morphine injection, ondansetron **OR** ondansetron (ZOFRAN) IV, oxyCODONE, sodium chloride flush  Assessment/Plan:  Active Problems:   Hypertension   Hyperlipidemia   Adenocarcinoma of left lung, stage 4 (HCC)   Thrombocytopenia (HCC)   Pneumonia   Malnutrition of moderate degree   Postobstructive pneumonia   Elevated TSH   Tachycardia   C. difficile diarrhea    Acute respiratory failure with hypoxia This is in the setting of stage IV lung cancer and postobstructive pneumonia and left-sided pleural effusion.  Patient remains tachypneic although is stable.  Palliative medicine has been following.  Plan appears to be for him to go to residential hospice.  Hold off on thoracentesis.    C. difficile diarrhea with sepsis Patient with multiple episodes of loose stool at home.  He has been in the hospital many times in the last 2 months.  He has been on antibiotics.  He was considered to be high risk for C. difficile infection.  Stool studies are positive.  She is on oral vancomycin which will be continued.  Flexi-Seal placed for mainly comfort.  Continues to be tachycardic and tachypneic.  Evidence for sepsis has been present.  Leukocytosis is also present.  Postobstructive pneumonia with positive blood culture Noted on CT scan.  He has had some of these findings previously as well.  Does not have significant cough although he is short of breath.  He has had these findings noted previously and so clinical significance is unclear.  He remains on Zosyn for now.  One set of blood culture positive for coag negative staph which is likely a contaminant.  We will not change  antibiotic regimen for now.    Stage IV non-small cell lung cancer Patient's last chemotherapy was in December.  He has not been able to receive any further chemotherapy as he has been in the hospital  every time that he was supposed to get it.  He has recently completed course of palliative radiation.  Discussed with Dr. [name] yesterday who agreed with pursuing hospice.  No role for further treatment.  Tachycardia Noted to be sinus tachycardia.  Possibly due to sepsis and volume depletion.  Continue IV fluids.  EKG showed sinus tachycardia.  Echocardiogram done earlier this year showed normal systolic function.  Hypokalemia Potassium level remains low. Likely due to GI loss. This will be repleted aggressively.  Magnesium was noted to be normal.     Macrocytic anemia Hemoglobin close to baseline.  No evidence for bleeding.  Continue to monitor.    Elevated TSH TSH noted to be mildly elevated but actually better than what it was a few weeks ago.  Free T4 normal at that time.  No need to initiate treatment.  History of DVT and PE Continue with Xarelto for now.  Mild transaminases  Stable.  Thrombocytopenia Platelet counts are stable.  Continue to monitor.  DVT Prophylaxis: On Xarelto    Code Status: DNR Family Communication: Discussed with patient and his wife Disposition Plan: Management as outlined above.  Await placement to residential hospice.    LOS: 2 days   [facility] Pager [phone] 01/21/2018, 7:55 AM  If 7PM-7AM, please contact night-coverage at [email or web address], password [facility]

## 2018-01-21 NOTE — Progress Notes (Signed)
PHARMACY - PHYSICIAN COMMUNICATION CRITICAL VALUE ALERT - BLOOD CULTURE IDENTIFICATION (BCID)  Nicholas Hale is an 72 y.o. male who presented to [facility] on 01/19/2018 with a chief complaint of acute respiratory failure with hypoxis  Assessment: postobtructive PNA  Name of physician (or Provider) Contacted: [place]  Current antibiotics: Zosyn  Changes to prescribed antibiotics recommended:  Likely contaminant, no changes at this time  Results for orders placed or performed during the hospital encounter of 01/19/18  Blood Culture ID Panel (Reflexed) (Collected: 01/19/2018  6:14 PM)  Result Value Ref Range   Enterococcus species NOT DETECTED NOT DETECTED   Listeria monocytogenes NOT DETECTED NOT DETECTED   Staphylococcus species DETECTED (A) NOT DETECTED   Staphylococcus aureus NOT DETECTED NOT DETECTED   Methicillin resistance DETECTED (A) NOT DETECTED   Streptococcus species NOT DETECTED NOT DETECTED   Streptococcus agalactiae NOT DETECTED NOT DETECTED   Streptococcus pneumoniae NOT DETECTED NOT DETECTED   Streptococcus pyogenes NOT DETECTED NOT DETECTED   Acinetobacter baumannii NOT DETECTED NOT DETECTED   Enterobacteriaceae species NOT DETECTED NOT DETECTED   Enterobacter cloacae complex NOT DETECTED NOT DETECTED   Escherichia coli NOT DETECTED NOT DETECTED   Klebsiella oxytoca NOT DETECTED NOT DETECTED   Klebsiella pneumoniae NOT DETECTED NOT DETECTED   Proteus species NOT DETECTED NOT DETECTED   Serratia marcescens NOT DETECTED NOT DETECTED   Haemophilus influenzae NOT DETECTED NOT DETECTED   Neisseria meningitidis NOT DETECTED NOT DETECTED   Pseudomonas aeruginosa NOT DETECTED NOT DETECTED   Candida albicans NOT DETECTED NOT DETECTED   Candida glabrata NOT DETECTED NOT DETECTED   Candida krusei NOT DETECTED NOT DETECTED   Candida parapsilosis NOT DETECTED NOT DETECTED   Candida tropicalis NOT DETECTED NOT DETECTED    Dorrene German 01/21/2018  4:14 AM

## 2018-01-22 MED ORDER — ACETAMINOPHEN 325 MG PO TABS: 650.0000 mg | ORAL_TABLET | Freq: Four times a day (QID) | ORAL | Status: AC | PRN

## 2018-01-22 MED ORDER — ONDANSETRON HCL 4 MG/2ML IJ SOLN: 4.0000 mg | Freq: Four times a day (QID) | INTRAMUSCULAR | 0 refills | Status: AC | PRN

## 2018-01-22 MED ORDER — VANCOMYCIN 50 MG/ML ORAL SOLUTION: 125.0000 mg | Freq: Four times a day (QID) | ORAL | Status: AC

## 2018-01-22 MED ORDER — GUAIFENESIN ER 600 MG PO TB12: 600.0000 mg | ORAL_TABLET | Freq: Two times a day (BID) | ORAL | Status: AC

## 2018-01-22 MED ORDER — MORPHINE SULFATE (PF) 4 MG/ML IV SOLN: 1.0000 mg | INTRAVENOUS | 0 refills | Status: AC | PRN

## 2018-01-22 MED ORDER — ONDANSETRON HCL 4 MG PO TABS: 4.0000 mg | ORAL_TABLET | Freq: Four times a day (QID) | ORAL | 0 refills | Status: AC | PRN

## 2018-01-23 LAB — BLOOD CULTURE ID PANEL (REFLEXED)
ACINETOBACTER BAUMANNII: NOT DETECTED
CANDIDA ALBICANS: NOT DETECTED
CANDIDA PARAPSILOSIS: NOT DETECTED
Candida glabrata: NOT DETECTED
Candida krusei: NOT DETECTED
Candida tropicalis: NOT DETECTED
Enterobacter cloacae complex: NOT DETECTED
Enterobacteriaceae species: NOT DETECTED
Enterococcus species: NOT DETECTED
Escherichia coli: NOT DETECTED
HAEMOPHILUS INFLUENZAE: NOT DETECTED
KLEBSIELLA OXYTOCA: NOT DETECTED
KLEBSIELLA PNEUMONIAE: NOT DETECTED
Listeria monocytogenes: NOT DETECTED
NEISSERIA MENINGITIDIS: NOT DETECTED
PROTEUS SPECIES: NOT DETECTED
Pseudomonas aeruginosa: NOT DETECTED
SERRATIA MARCESCENS: NOT DETECTED
STAPHYLOCOCCUS AUREUS BCID: NOT DETECTED
STAPHYLOCOCCUS SPECIES: NOT DETECTED
STREPTOCOCCUS SPECIES: NOT DETECTED
Streptococcus agalactiae: NOT DETECTED
Streptococcus pneumoniae: NOT DETECTED
Streptococcus pyogenes: NOT DETECTED

## 2018-01-24 ENCOUNTER — Telehealth: Payer: Self-pay | Admitting: *Deleted

## 2018-01-24 ENCOUNTER — Ambulatory Visit: Payer: Medicare Other | Admitting: Emergency Medicine

## 2018-01-24 NOTE — Telephone Encounter (Signed)
Wife called, pt expired approximately 6 hours after arriving at [facility].

## 2018-01-25 ENCOUNTER — Telehealth: Payer: Self-pay | Admitting: Medical Oncology

## 2018-01-25 LAB — CULTURE, BLOOD (ROUTINE X 2)
CULTURE: NO GROWTH
SPECIAL REQUESTS: ADEQUATE

## 2018-01-25 NOTE — Telephone Encounter (Signed)
wife stated [facility] needs cell type on death certificate. Called [facility] and Barbarann Ehlers and asked for return call from [place].

## 2018-01-27 ENCOUNTER — Other Ambulatory Visit: Payer: Medicare Other

## 2018-01-27 ENCOUNTER — Ambulatory Visit: Payer: Medicare Other

## 2018-01-27 ENCOUNTER — Encounter: Payer: Medicare Other | Admitting: Nutrition

## 2018-01-27 ENCOUNTER — Ambulatory Visit: Payer: Medicare Other | Admitting: Internal Medicine

## 2018-01-28 NOTE — Clinical Social Work Note (Signed)
Patient will discharge to [facility] today, transported by ambulance. Discharge summary transmitted to facility and family advised that transport arranged. CSW signing off as no other SW intervention services needed at this time.  Xzander Gilham Givens, MSW, LCSW Licensed Clinical Social Worker [place] [phone]

## 2018-01-28 NOTE — Progress Notes (Signed)
Daily Progress Note   Patient Name: Nicholas Hale       Date: 01-29-2018 DOB: 01/07/1946  Age: 72 y.o. MRN#: [id number] Attending Physician: Bonnielee Haff, MD Primary Care Physician: [name], MD Admit Date: 01/19/2018  Reason for Consultation/Follow-up: Establishing goals of care  Subjective:  Patient is resting in bed, he opens his eyes, answers a few questions appropriately He is in mild to moderate distress, with shortness of breath Wife at bedside  Patient underwent thoracentesis on 01-21-18.   See below  Length of Stay: 3  Current Medications: Scheduled Meds:  . Chlorhexidine Gluconate Cloth  6 each Topical Daily  . feeding supplement  1 Container Oral Q24H  . feeding supplement (ENSURE ENLIVE)  237 mL Oral BID BM  . guaiFENesin  600 mg Oral BID  . metoprolol tartrate  12.5 mg Oral BID  . mirtazapine  15 mg Oral QHS  . multivitamin with minerals  1 tablet Oral Daily  . potassium chloride  40 mEq Oral Once  . rivaroxaban  20 mg Oral q1800  . sodium chloride flush  10-40 mL Intracatheter Q12H  . sucralfate  1 g Oral TID WC & HS  . vancomycin  125 mg Oral Q6H    Continuous Infusions: . sodium chloride 75 mL/hr at 01/21/18 1811  . piperacillin-tazobactam (ZOSYN)  IV 3.375 g (01-29-18 0533)    PRN Meds: acetaminophen **OR** acetaminophen, ipratropium, levalbuterol, metoprolol tartrate, morphine injection, ondansetron **OR** ondansetron (ZOFRAN) IV, oxyCODONE, sodium chloride flush  Physical Exam          General appearance: alert,  fatigued and mild distress Resp: coarse congested breath sounds anteriorly  Cardio: regular rate and rhythm, S1, S2 normal,   GI: soft, non-tender; bowel sounds normal; no masses,  no organomegaly Extremities: has some  edema Appears weak, appears chronically ill   Vital Signs: BP 136/65   Pulse (!) 115   Temp (!) 97.5 F (36.4 C) (Oral)   Resp (!) 31   Ht 5\' 11"  (1.803 m)   Wt 83 kg (182 lb 15.7 oz)   SpO2 92%   BMI 25.52 kg/m  SpO2: SpO2: 92 % O2 Device: O2 Device: Nasal Cannula O2 Flow Rate: O2 Flow Rate (L/min): 2 L/min  Intake/output summary:   Intake/Output Summary (Last 24 hours) at 01/29/2018 [phone]  Last data filed at 2018/02/14 0600 Gross per 24 hour  Intake 2400 ml  Output 300 ml  Net 2100 ml   LBM: Last BM Date: 01/21/18 Baseline Weight: Weight: 83.5 kg (184 lb) Most recent weight: Weight: 83 kg (182 lb 15.7 oz)       Palliative Assessment/Data: PPS 30%   Flowsheet Rows     Most Recent Value  Intake Tab  Referral Department  Hospitalist  Unit at Time of Referral  ICU  Palliative Care Primary Diagnosis  Cancer  Palliative Care Type  Return patient Palliative Care  Reason for referral  Non-pain Symptom, End of Life Care Assistance, Counsel Regarding Hospice, Clarify Goals of Care  Date first seen by Palliative Care  01/20/18  Clinical Assessment  Palliative Performance Scale Score  20%  Pain Max last 24 hours  4  Pain Min Last 24 hours  3  Dyspnea Max Last 24 Hours  6  Dyspnea Min Last 24 hours  4  Nausea Max Last 24 Hours  2  Nausea Min Last 24 Hours  1  Anxiety Max Last 24 Hours  4  Anxiety Min Last 24 Hours  3  Psychosocial & Spiritual Assessment  Palliative Care Outcomes  Patient/Family meeting held?  Yes  Who was at the meeting?  patient(who is confused and no longer decisional) son at bedside, called wife over the phone.       Patient Active Problem List   Diagnosis Date Noted  . C. difficile diarrhea 01/20/2018  . Tachycardia 01/19/2018  . Encounter for palliative care   . Hypokalemia   . Elevated TSH   . Postobstructive pneumonia 01/05/2018  . Sepsis due to pneumonia ([place]) 01/05/2018  . 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 Staphylococcus aureus  bacteremia   . Bacteremia   . Malnutrition of moderate degree 12/17/2017  . Pneumonia 12/14/2017  . Leukocytosis 12/14/2017  . Anemia 12/14/2017  . Hyponatremia 12/14/2017  . HCAP (healthcare-associated pneumonia)   . Influenza vaccination administered at current visit 09/02/2017  . Gingival bleeding 08/21/2017  . Thrombocytopenia ([place]) 08/21/2017  . Dyspnea on exertion 04/05/2017  . Chemotherapy induced neutropenia ([place]) 03/31/2017  . Port catheter in place 03/03/2017  . Encounter for antineoplastic chemotherapy 02/17/2017  . Adenocarcinoma of left lung, stage 4 ([place]) 10/05/2016  . Goals of care, counseling/discussion 10/05/2016  . Chronic fatigue 10/05/2016  . Lung cancer ([place]) 09/25/2016  . Acute deep vein thrombosis (DVT) of proximal vein of both lower extremities (H[facility])   . Cardiac tamponade   . Elevated troponin 09/20/2016  . 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 Ventricular tachycardia ([place]) 09/19/2016  . Renal cyst, right 09/19/2016  . Liver lesion 09/19/2016  . Nonsustained ventricular tachycardia ([place])   . History of tobacco abuse 01/28/2016  . Primary osteoarthritis of right hip 05/10/2015  . 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 Arthritis of left hip 05/23/2014  . Hypertension 05/15/2014  . Hyperlipidemia 05/15/2014    Palliative Care Assessment & Plan   Patient Profile:    Assessment:  metastatic non-small cell lung cancer, adenocarcinoma status post several chemotherapy regimens including immunotherapy as well as palliative radiation. Unfortunately the patient continues to have disease progression despite radiation and chemotherapy. Existential distress Shortness of breath Generalized discomort cough Recommendations/Plan:   DNR DNI  Begin directing efforts towards comfort measures, transfer to residential hospice.   IV Morphine PRN  PO Oxy IR PRN  CSW consulted completed for residential hospice placement: [facility]  transfer today  Bedside suction for excessive secretions.     Code Status:    Code Status Orders  (From admission, onward)        Start     Ordered   01/20/18 0109  Do not attempt resuscitation (DNR)  Continuous    Question Answer Comment  In the event of cardiac or respiratory ARREST Do not call a "code blue"   In the event of cardiac or respiratory ARREST Do not perform Intubation, CPR, defibrillation or ACLS   In the event of cardiac or respiratory ARREST Use medication by any route, position, wound care, and other measures to relive pain and suffering. May use oxygen, suction and manual treatment of airway obstruction as needed for comfort.   Comments Confirmed with patient and family 2/6      01/20/18 0108    Code Status History    Date Active Date Inactive Code Status Order ID Comments User Context   01/05/2018 20:08 01/13/2018 14:53 DNR [id number]  Martyn Malay, RN Inpatient   01/05/2018 17:24 01/05/2018 19:59 DNR [id number]  Patrecia Pour, MD Inpatient   12/15/2017 08:19 12/24/2017 17:37 Full Code [id number]  Jani Gravel, MD ED   08/21/2017 13:39 08/22/2017 17:20 Full Code [id number]  Truett Mainland, DO ED   02/01/2017 15:52 02/02/2017 03:10 Full Code [id number]  Arne Cleveland, MD HOV   09/19/2016 22:36 09/23/2016 16:45 Full Code [id number]  Ivor Costa, MD Inpatient   05/15/2015 17:53 05/17/2015 13:50 Full Code [id number]  Frederik Pear, MD Inpatient   08/07/2014 10:00 08/08/2014 14:40 Full Code [id number]  Wilhelmina Mcardle, MD Inpatient   08/06/2014 14:51 08/07/2014 10:00 Full Code [id number]  Sandi Mariscal, MD Inpatient   08/05/2014 20:48 08/06/2014 14:51 Full Code [id number]  Marybelle Killings, MD Inpatient   08/05/2014 17:30 08/05/2014 20:48 Full Code [id number]  Doree Fudge, MD ED   05/23/2014 15:27 05/25/2014 17:18 Full Code [id number]  Kerin Salen, MD Inpatient    Advance Directive Documentation     Most Recent Value  Type of Advance Directive  Healthcare Power of Attorney  Pre-existing out of  facility DNR order (yellow form or pink MOST form)  No data  "MOST" Form in Place?  No data       Prognosis:   < 2 weeks  Discharge Planning:  [place] liaison assistance.   Care plan was discussed with patient, wife, bedside RN.   Thank you for allowing the Palliative Medicine Team to assist in the care of this patient.   Time In: 9 Time Out: 9.35 Total Time 35 Prolonged Time Billed  no       Greater than 50%  of this time was spent counseling and coordinating care related to the above assessment and plan.  Loistine Chance, MD [phone]  Please contact Palliative Medicine Team phone at [phone] for questions and concerns.

## 2018-01-28 NOTE — Discharge Summary (Signed)
[facility]  Physician Discharge Summary   Patient ID: Nicholas Hale MRN: [id number] DOB/AGE: Oct 02, 1946 72 y.o.  Admit date: 01/19/2018 Discharge date: 26-Jan-2018  PCP: [name], MD  DISCHARGE DIAGNOSES:  Active Problems:   Hypertension   Hyperlipidemia   Adenocarcinoma of left lung, stage 4 (HCC)   Thrombocytopenia (HCC)   Pneumonia   Malnutrition of moderate degree   Postobstructive pneumonia   Elevated TSH   Tachycardia   C. difficile diarrhea   RECOMMENDATIONS FOR OUTPATIENT FOLLOW UP: 1. Patient to be discharged to residential hospice   DISCHARGE CONDITION: poor  Diet recommendation: As tolerated  Filed Weights   01/20/18 0337 01/21/18 0444 01/26/2018 0500  Weight: 83.3 kg (183 lb 10.3 oz) 83 kg (182 lb 15.7 oz) 83 kg (182 lb 15.7 oz)    INITIAL HISTORY:  72 year old Caucasian male with past medical history significant for stage IV non-small cell lung cancer followed by Dr. [name] recently completed palliative radiation.  He has had 2 hospitalizations in January and then in February for postobstructive pneumonia, MSSA bacteremia.  He also has a history of PE and DVT on Xarelto.  Comes in with complains of generalized weakness, fatigue, elevated heart rate.  There was evidence for worsening left-sided pleural effusion and continued left lower lobe pneumonia.  He was hospitalized for further management.  Subsequently found to have C. difficile.  Consultants: Palliative medicine.  Oncology.  Procedures: None yet  Antibiotics: Started on vancomycin and Zosyn.  IV Vancomycin discontinued. Started on oral vancomycin   HOSPITAL COURSE:   Acute respiratory failure with hypoxia This was in the setting of stage IV lung cancer and postobstructive pneumonia and left-sided pleural effusion.  Patient remains tachypneic although is stable.  After further discussions with oncology patient did undergo thoracentesis which helped his symptoms. Continue  supplemental Oxygen for comfort and hypoxia.  C. difficile diarrhea with sepsis Patient with multiple episodes of loose stool at home. Patient was septic. He has been in the hospital many times in the last 2 months.  He has been on antibiotics.  He was considered to be high risk for C. difficile infection.  Stool studies were positive. Patient was placed on oral vancomycin which will be continued for 14 days.  Flexi-Seal placed for mainly comfort. Continues to have significant stool output.   Postobstructive pneumonia with positive blood culture Noted on CT scan.  He has had some of these findings previously as well.  Does not have significant cough although he is short of breath.  He has had these findings noted previously and so clinical significance is unclear.  He was placed on Zosyn.  One set of blood culture positive for coag negative staph which is likely a contaminant. No need to continue zosyn.    Stage IV non-small cell lung cancer Patient's last chemotherapy was in December.  He has not been able to receive any further chemotherapy as he has been in the hospital every time that he was supposed to get it.  He has recently completed course of palliative radiation.  Discussed with Dr. [name] who agreed with pursuing hospice.  No role for further treatment. Plan is for residential hospice.  Tachycardia Noted to be sinus tachycardia.  Possibly due to sepsis and volume depletion.  EKG showed sinus tachycardia.  Echocardiogram done earlier this year showed normal systolic function.  Hypokalemia Likely due to GI loss. Potassium was repleted aggressively.  Magnesium was noted to be normal.      Macrocytic  anemia Hemoglobin close to baseline.  No evidence for bleeding.    Elevated TSH TSH noted to be mildly elevated but actually better than what it was a few weeks ago.  Free T4 normal at that time.  No need to initiate treatment.  History of DVT and PE Continue with Xarelto for  now.  Mild transaminases  Stable.  Thrombocytopenia Platelet counts are stable.  Continue to monitor.  [place] for discharge to residential hospice.    PERTINENT LABS:  The results of significant diagnostics from this hospitalization (including imaging, microbiology, ancillary and laboratory) are listed below for reference.    Microbiology: Recent Results (from the past 240 hour(s))  Culture, blood (Routine x 2)     Status: Abnormal (Preliminary result)   Collection Time: 01/19/18  6:14 PM  Result Value Ref Range Status   Specimen Description   Final    BLOOD Performed at [facility], [place] [address] [place], [place] [address]    Special Requests   Final    BOTTLES DRAWN AEROBIC AND ANAEROBIC Blood Culture adequate volume Performed at [place] [address], [place], [place] [address]    Culture  Setup Time   Final    GRAM POSITIVE COCCI ANAEROBIC BOTTLE ONLY CRITICAL RESULT CALLED TO, READ BACK BY AND VERIFIED WITH: B GREEN PHARMD 01/21/18 0406 JDW    Culture (A)  Final    STAPHYLOCOCCUS SPECIES (COAGULASE NEGATIVE) THE SIGNIFICANCE OF ISOLATING THIS ORGANISM FROM A SINGLE SET OF BLOOD CULTURES WHEN MULTIPLE SETS ARE DRAWN IS UNCERTAIN. PLEASE NOTIFY THE MICROBIOLOGY DEPARTMENT WITHIN ONE WEEK IF SPECIATION AND SENSITIVITIES ARE REQUIRED. Performed at [facility], [place] [address] [place], [place] [address]    Report Status PENDING  Incomplete  Blood Culture ID Panel (Reflexed)     Status: Abnormal   Collection Time: 01/19/18  6:14 PM  Result Value Ref Range Status   Enterococcus species NOT DETECTED NOT DETECTED Final   Listeria monocytogenes NOT DETECTED NOT DETECTED Final   Staphylococcus species DETECTED (A) NOT DETECTED Final    Comment: Methicillin (oxacillin) resistant coagulase negative staphylococcus. Possible blood culture contaminant (unless isolated from more than one blood culture draw or clinical case  suggests pathogenicity). No antibiotic treatment is indicated for blood  culture contaminants. CRITICAL RESULT CALLED TO, READ BACK BY AND VERIFIED WITH: B GREEN PHARMD 01/21/18 0406 JDW    Staphylococcus aureus NOT DETECTED NOT DETECTED Final   Methicillin resistance DETECTED (A) NOT DETECTED Final    Comment: CRITICAL RESULT CALLED TO, READ BACK BY AND VERIFIED WITH: B GREEN PHARMD 01/21/18 0406 JDW    Streptococcus species NOT DETECTED NOT DETECTED Final   Streptococcus agalactiae NOT DETECTED NOT DETECTED Final   Streptococcus pneumoniae NOT DETECTED NOT DETECTED Final   Streptococcus pyogenes NOT DETECTED NOT DETECTED Final   Acinetobacter baumannii NOT DETECTED NOT DETECTED Final   Enterobacteriaceae species NOT DETECTED NOT DETECTED Final   Enterobacter cloacae complex NOT DETECTED NOT DETECTED Final   Escherichia coli NOT DETECTED NOT DETECTED Final   Klebsiella oxytoca NOT DETECTED NOT DETECTED Final   Klebsiella pneumoniae NOT DETECTED NOT DETECTED Final   Proteus species NOT DETECTED NOT DETECTED Final   Serratia marcescens NOT DETECTED NOT DETECTED Final   Haemophilus influenzae NOT DETECTED NOT DETECTED Final   Neisseria meningitidis NOT DETECTED NOT DETECTED Final   Pseudomonas aeruginosa NOT DETECTED NOT DETECTED Final   Candida albicans NOT DETECTED NOT DETECTED Final   Candida  glabrata NOT DETECTED NOT DETECTED Final   Candida krusei NOT DETECTED NOT DETECTED Final   Candida parapsilosis NOT DETECTED NOT DETECTED Final   Candida tropicalis NOT DETECTED NOT DETECTED Final    Comment: Performed at [facility], [place] [address], [place], [place] [address]  Culture, blood (Routine x 2)     Status: None (Preliminary result)   Collection Time: 01/20/18  1:09 AM  Result Value Ref Range Status   Specimen Description   Final    BLOOD LEFT ANTECUBITAL Performed at [facility] [address], [place], [place] [address]    Special Requests   Final     BOTTLES DRAWN AEROBIC AND ANAEROBIC Blood Culture adequate volume Performed at [facility] [address], [place], [place] [address]    Culture   Final    NO GROWTH 1 DAY Performed at [facility], [place] [address] [place], [place] [address]    Report Status PENDING  Incomplete  MRSA PCR Screening     Status: None   Collection Time: 01/20/18  1:10 AM  Result Value Ref Range Status   MRSA by PCR NEGATIVE NEGATIVE Final    Comment:        The GeneXpert MRSA Assay (FDA approved for NASAL specimens only), is one component of a comprehensive MRSA colonization surveillance program. It is not intended to diagnose MRSA infection nor to guide or monitor treatment for MRSA infections. Performed at [facility], [place] [address], [place], [place] [address]   C difficile quick scan w PCR reflex     Status: Abnormal   Collection Time: 01/20/18  2:30 AM  Result Value Ref Range Status   C Diff antigen POSITIVE (A) NEGATIVE Final   C Diff toxin POSITIVE (A) NEGATIVE Final   C Diff interpretation Toxin producing C. difficile detected.  Final    Comment: CRITICAL RESULT CALLED TO, READ BACK BY AND VERIFIED WITH: K WILLING RN [phone] 01/20/18 A NAVARRO Performed at [facility], [place] [address] [place], [place] [address]      Labs: Basic Metabolic Panel: Recent Labs  Lab 01/19/18 1814 01/20/18 0109 01/20/18 0117 01/21/18 0535  NA 142  --  142 142  K 2.8*  --  3.1* 2.8*  CL 112*  --  111 113*  CO2 20*  --  20* 19*  GLUCOSE 135*  --  136* 129*  BUN 17  --  17 23*  CREATININE 0.93  --  0.95 1.17  CALCIUM 7.4*  --  7.5* 7.7*  MG  --  2.3  --   --    Liver Function Tests: Recent Labs  Lab 01/19/18 1814 01/20/18 0117  AST 105* 125*  ALT 48 53  ALKPHOS 139* 136*  BILITOT 0.5 0.8  PROT 5.6* 5.8*  ALBUMIN 1.8* 1.8*   No results for input(s): LIPASE, AMYLASE in the last 168 hours. No results for input(s): AMMONIA in  the last 168 hours. CBC: Recent Labs  Lab 01/19/18 1814 01/20/18 0117 01/21/18 0535  WBC 15.5* 16.7* 14.9*  NEUTROABS 14.8*  --   --   HGB 8.6* 9.0* 9.1*  HCT 28.1* 29.8* 30.5*  MCV 108.1* 108.0* 105.2*  PLT 105* 105* 107*   BNP: BNP (last 3 results) Recent Labs    01/19/18 1814  BNP 160.7*    IMAGING STUDIES Dg Chest 2 View  Result Date: 01/19/2018 CLINICAL DATA:  Dyspnea and left-sided chest pain  x3 days EXAM: CHEST  2 VIEW COMPARISON:  01/05/2018 CT and CXR FINDINGS: Right-sided PICC line tip terminates in the distal SVC. Heart size is normal. No aortic aneurysm is noted. Slight interval increase in moderate left pleural effusion. A small right pleural effusion is now noted as well. There is pulmonary vascular congestion. IMPRESSION: Slight interval increase in bilateral pleural effusions, left greater than right. Persistent left basilar consolidation. Stable PICC line position. Electronically Signed   By: Ashley Royalty M.D.   On: 01/19/2018 18:04   Dg Chest 2 View  Result Date: 01/05/2018 CLINICAL DATA:  Shortness of breath. Stage IV lung cancer. Pneumonia. EXAM: CHEST  2 VIEW COMPARISON:  Two-view chest x-ray and CT of the chest 12/14/2017. FINDINGS: Heart size is normal. A right-sided PICC line terminates in the distal SVC. The right lung is clear. Progressive left lower lobe airspace disease versus mass lesion is noted. Left upper lobe is clear. IMPRESSION: 1. Progressive left lower lobe airspace opacity. This likely represents post obstructive disease or possibly lobar pneumonia in the setting of known neoplasm. 2. Right-sided PICC line in satisfactory position. Electronically Signed   By: San Morelle M.D.   On: 01/05/2018 10:16   Ct Angio Chest Pe W And/or Wo Contrast  Result Date: 01/19/2018 CLINICAL DATA:  Shortness of breath with left-sided chest pain history of lung cancer EXAM: CT ANGIOGRAPHY CHEST WITH CONTRAST TECHNIQUE: Multidetector CT imaging of the chest was  performed using the standard protocol during bolus administration of intravenous contrast. Multiplanar CT image reconstructions and MIPs were obtained to evaluate the vascular anatomy. CONTRAST:  156mL ISOVUE-370 IOPAMIDOL (ISOVUE-370) INJECTION 76% COMPARISON:  Radiograph 01/19/2018, CT chest 01/05/2018, 12/14/2017, 11/24/2017, 09/20/2017, PET CT 10/05/2016 FINDINGS: Cardiovascular: Satisfactory opacification of the pulmonary arteries to the segmental level. No evidence of pulmonary embolism. Nonaneurysmal aorta. Mild aortic atherosclerosis. Vascular catheter tip in the distal SVC. Minimal coronary vascular calcification. Normal heart size. No large pericardial effusion Mediastinum/Nodes: Midline trachea. No thyroid mass. Probable left lobectomy. Increased upper mediastinal adenopathy. A prevascular lymph node measures 19 mm, compared with 17 mm previously. Lymph node adjacent to the arch measures 11 mm compared with 9 mm previously. Esophagus within normal limits. Lungs/Pleura: Moderate emphysema. Small right pleural effusion, new. Moderate to large left pleural effusion, increased. Left hilar infiltrative soft tissue mass/density is stable to minimally decreased. Left infrahilar soft tissue mass is contiguous with dense consolidation in the left lower lobe. A central focal area of low attenuation within the consolidated left lower lobe may represent necrosis. Residual narrowing of left lower lobe bronchi by soft tissue mass/consolidation though with some improvement proximally. Some clearing of inflammatory infiltrates in the lingula, however multifocal nodular densities or nodules are evident within the lingula. A masslike density is present in the anterior left lung base but is contiguous with left lower lobe consolidation. Upper Abdomen: No acute abnormality. Musculoskeletal: Degenerative changes of the spine. No acute or suspicious bone lesion. Surgical hardware partially seen in the proximal left humerus.  Review of the MIP images confirms the above findings. IMPRESSION: 1. Negative for acute pulmonary embolus. 2. Moderate to large left pleural effusion, increased compared to prior. Continued dense left lower lobe pneumonia with new central low density in the consolidation possibly due to necrosis. Left lower lobe pneumonia is contiguous with ill-defined, infiltrative left hilar mass with narrowing of the left lower lobe bronchi. Multiple nodules or nodular foci of disease visualized within the lingula and anterior left lung base. 3. Increased mediastinal adenopathy  since the most recent chest CT. 4. New right pleural effusion, small 5. Emphysema Aortic Atherosclerosis (ICD10-I70.0) and Emphysema (ICD10-J43.9). Electronically Signed   By: Donavan Foil M.D.   On: 01/19/2018 21:02   Ct Angio Chest Pe W And/or Wo Contrast  Result Date: 01/05/2018 CLINICAL DATA:  Chest pain and shortness of breath. Cough. History of lung cancer. EXAM: CT ANGIOGRAPHY CHEST WITH CONTRAST TECHNIQUE: Multidetector CT imaging of the chest was performed using the standard protocol during bolus administration of intravenous contrast. Multiplanar CT image reconstructions and MIPs were obtained to evaluate the vascular anatomy. CONTRAST:  70mL ISOVUE-370 IOPAMIDOL (ISOVUE-370) INJECTION 76% COMPARISON:  CT scan 12/14/2017 FINDINGS: Cardiovascular: The heart is normal in size and stable. No pericardial effusion. Stable mild tortuosity and atherosclerotic changes involving the thoracic aorta. No dissection. The branch vessels are patent. Stable scattered coronary artery calcifications. The pulmonary arterial tree is fairly well opacified. No filling defects to suggest pulmonary embolism. Mediastinum/Nodes: Persistent enlarged mediastinal and hilar lymph nodes. Infiltrating soft tissue in the left hilum is progressive and there is complete or near complete occlusion of the left lower lobe bronchus and also the lingular bronchus. Lungs/Pleura:  Further narrowing of the right lower lobe bronchus with progressive dense airspace consolidation in the right lobe. Findings suspicious for progressive tumor and postobstructive pneumonia. There is a new left pleural effusion. Several ill-defined nodular airspace opacities in the lingula are likely nodular infiltrates. Stable underlying emphysema. No metastatic nodules in the right lung. Upper Abdomen: No significant upper abdominal findings. No obvious hepatic metastatic disease. Musculoskeletal: No significant bony findings. No chest wall mass, supraclavicular or axillary lymphadenopathy. Review of the MIP images confirms the above findings. IMPRESSION: 1. Progressive ill-defined soft tissue infiltrating the left hilum and mediastinum with now complete or near complete occlusion of the left lower lobe bronchus and lingular bronchus. This is favored to represent tumor. Bronchoscopy is recommended. Extensive postobstructive pneumonia, worsened since the prior study. 2. Stable to slightly larger mediastinal and hilar lymph nodes. 3. No CT findings for pulmonary embolism. 4. Stable emphysematous changes and pulmonary scarring. Aortic Atherosclerosis (ICD10-I70.0) and Emphysema (ICD10-J43.9). Electronically Signed   By: Marijo Sanes M.D.   On: 01/05/2018 13:54   Dg Chest Port 1 View  Result Date: 01/21/2018 CLINICAL DATA:  72 y/o  M; status post left thoracentesis. EXAM: PORTABLE CHEST 1 VIEW COMPARISON:  01/19/2018 chest radiograph FINDINGS: Stable cardiac silhouette given projection and technique. Right PICC catheter tip projects over lower SVC. Diminished left pleural effusion with small residual. Persistent small right effusion. Persistent bibasilar opacities. No pneumothorax. IMPRESSION: Diminished left pleural effusion with small residual. No pneumothorax. Stable small right effusion and bibasilar opacities. Electronically Signed   By: Kristine Garbe M.D.   On: 01/21/2018 15:14   US Thoracentesis  Asp Pleural Space W/img Guide  Result Date: 01/21/2018 INDICATION: Patient with history of metastatic lung cancer, dyspnea, left pleural effusion. Request made for therapeutic left thoracentesis. EXAM: ULTRASOUND GUIDED THERAPEUTIC LEFT THORACENTESIS MEDICATIONS: 1% lidocaine local COMPLICATIONS: None immediate. PROCEDURE: An ultrasound guided thoracentesis was thoroughly discussed with the patient and questions answered. The benefits, risks, alternatives and complications were also discussed. The patient understands and wishes to proceed with the procedure. Written consent was obtained. Ultrasound was performed to localize and mark an adequate pocket of fluid in the left chest. The area was then prepped and draped in the normal sterile fashion. 1% Lidocaine was used for local anesthesia. Under ultrasound guidance a 6 Fr Safe-T-Centesis catheter was introduced.  Thoracentesis was performed. The catheter was removed and a dressing applied. FINDINGS: A total of approximately 1.4 liters of bloody fluid was removed. IMPRESSION: Successful ultrasound guided therapeutic left thoracentesis yielding 1.4 liters of pleural fluid. Follow-up chest x-ray revealed no pneumothorax. Read by: Rowe Robert, PA-C Electronically Signed   By: Jerilynn Mages.  Shick M.D.   On: 01/21/2018 15:30    DISCHARGE EXAMINATION: Vitals:   Feb 19, 2018 0600 2018-02-19 0800 2018/02/19 0900 Feb 19, 2018 1000  BP: 136/65 139/61 (!) 132/59 (!) 150/62  Pulse: (!) 115 (!) 119 (!) 117 (!) 120  Resp: (!) 31 (!) 35 (!) 35 (!) 34  Temp:  (!) 97.5 F (36.4 C)    TempSrc:  Oral    SpO2: 92% 90% 91% 91%  Weight:      Height:       General appearance: alert and cooperative Resp: Diminished air entry at the bases.  Left more than right.  No wheezing or crackles. Cardio: S1-S2 is tachycardic regular.  No S3-S4 GI: soft, non-tender; bowel sounds normal; no masses,  no organomegaly  DISPOSITION: Residential hospice      Allergies as of 02/19/18      Reactions     Lidocaine Swelling   "Hard white blisters" formed on tongue, swollen mouth      Medication List    STOP taking these medications   albuterol 108 (90 Base) MCG/ACT inhaler Commonly known as:  PROVENTIL HFA;VENTOLIN HFA   amoxicillin-clavulanate 875-125 MG tablet Commonly known as:  AUGMENTIN   atorvastatin 20 MG tablet Commonly known as:  LIPITOR   dexamethasone 4 MG tablet Commonly known as:  DECADRON   diphenhydrAMINE 25 MG tablet Commonly known as:  BENADRYL   lidocaine-prilocaine cream Commonly known as:  EMLA   magic mouthwash Soln   MUCINEX DM PO   polyethylene glycol packet Commonly known as:  MIRALAX / GLYCOLAX   senna-docusate 8.6-50 MG tablet Commonly known as:  Senokot-S   sucralfate 1 g tablet Commonly known as:  CARAFATE     TAKE these medications   acetaminophen 325 MG tablet Commonly known as:  TYLENOL Take 2 tablets (650 mg total) by mouth every 6 (six) hours as needed for mild pain (or Fever >/= 101).   feeding supplement (ENSURE ENLIVE) Liqd Take 237 mLs by mouth 2 (two) times daily between meals.   guaiFENesin 600 MG 12 hr tablet Commonly known as:  MUCINEX Take 1 tablet (600 mg total) by mouth 2 (two) times daily.   ipratropium 0.02 % nebulizer solution Commonly known as:  ATROVENT Take 2.5 mLs (0.5 mg total) by nebulization 3 (three) times daily.   levalbuterol 0.63 MG/3ML nebulizer solution Commonly known as:  XOPENEX Take 3 mLs (0.63 mg total) by nebulization 3 (three) times daily.   metoprolol tartrate 25 MG tablet Commonly known as:  LOPRESSOR Take 0.5 tablets (12.5 mg total) by mouth 2 (two) times daily.   mirtazapine 15 MG disintegrating tablet Commonly known as:  REMERON SOL-TAB Take 1 tablet (15 mg total) by mouth at bedtime.   morphine 4 MG/ML injection Inject 0.25 mLs (1 mg total) into the vein every 3 (three) hours as needed for moderate pain.   ondansetron 4 MG tablet Commonly known as:  ZOFRAN Take 1 tablet (4  mg total) by mouth every 6 (six) hours as needed for nausea.   ondansetron 4 MG/2ML Soln injection Commonly known as:  ZOFRAN Inject 2 mLs (4 mg total) into the vein every 6 (six) hours as needed for  nausea.   oxyCODONE 5 MG/5ML solution Commonly known as:  ROXICODONE Take 5 mLs (5 mg total) by mouth every 4 (four) hours as needed for severe pain.   prochlorperazine 10 MG tablet Commonly known as:  COMPAZINE Take 1 tablet (10 mg total) by mouth every 6 (six) hours as needed for nausea or vomiting.   sodium chloride 0.65 % Soln nasal spray Commonly known as:  OCEAN Place 2 sprays into both nostrils as needed for congestion.   vancomycin 50 mg/mL oral solution Commonly known as:  VANCOCIN Take 2.5 mLs (125 mg total) by mouth every 6 (six) hours for 14 days.   XARELTO 20 MG Tabs tablet Generic drug:  rivaroxaban TAKE 1 TABLET BY MOUTH EVERY DAY          TOTAL DISCHARGE TIME: 35 mins  [employer]  [facility] Pager [phone]  January 28, 2018, 10:34 AM

## 2018-01-28 DEATH — deceased

## 2018-01-29 ENCOUNTER — Ambulatory Visit: Payer: Medicare Other

## 2018-01-30 LAB — CULTURE, BLOOD (ROUTINE X 2): Special Requests: ADEQUATE

## 2018-01-30 LAB — SUSCEPTIBILITY, AER + ANAEROB

## 2018-01-30 LAB — SUSCEPTIBILITY RESULT

## 2018-02-10 ENCOUNTER — Other Ambulatory Visit: Payer: Self-pay | Admitting: Nurse Practitioner

## 2018-02-17 ENCOUNTER — Ambulatory Visit: Payer: Self-pay | Admitting: Radiation Oncology

## 2018-04-27 IMAGING — CR DG CHEST 2V
2 series · 2 of 2 positions shown · non-contrast
Comparison: 01/05/2018 CT and CXR

CLINICAL DATA: Dyspnea and left-sided chest pain x3 days

EXAM:
CHEST  2 VIEW

[w chest lat]
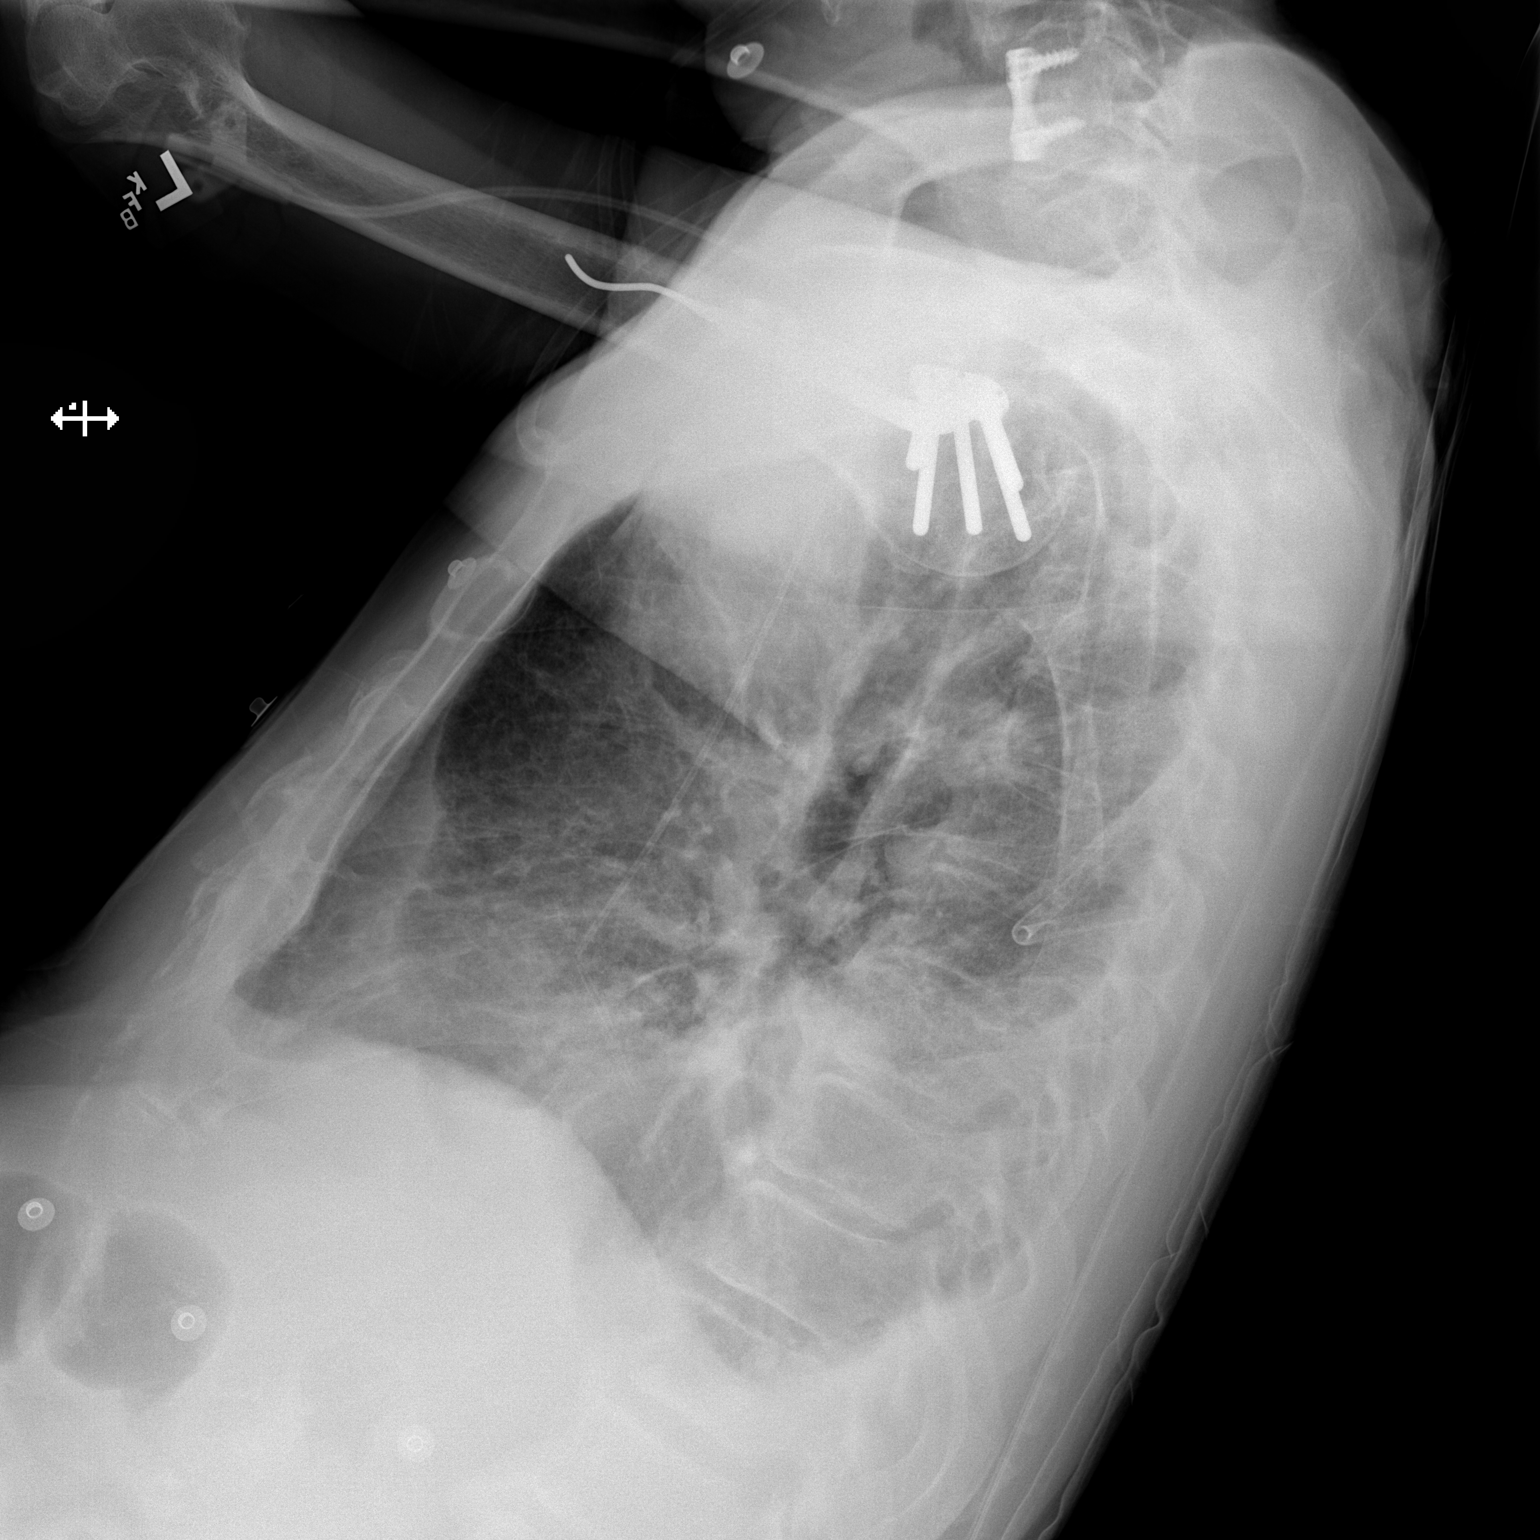

[x chest ap]
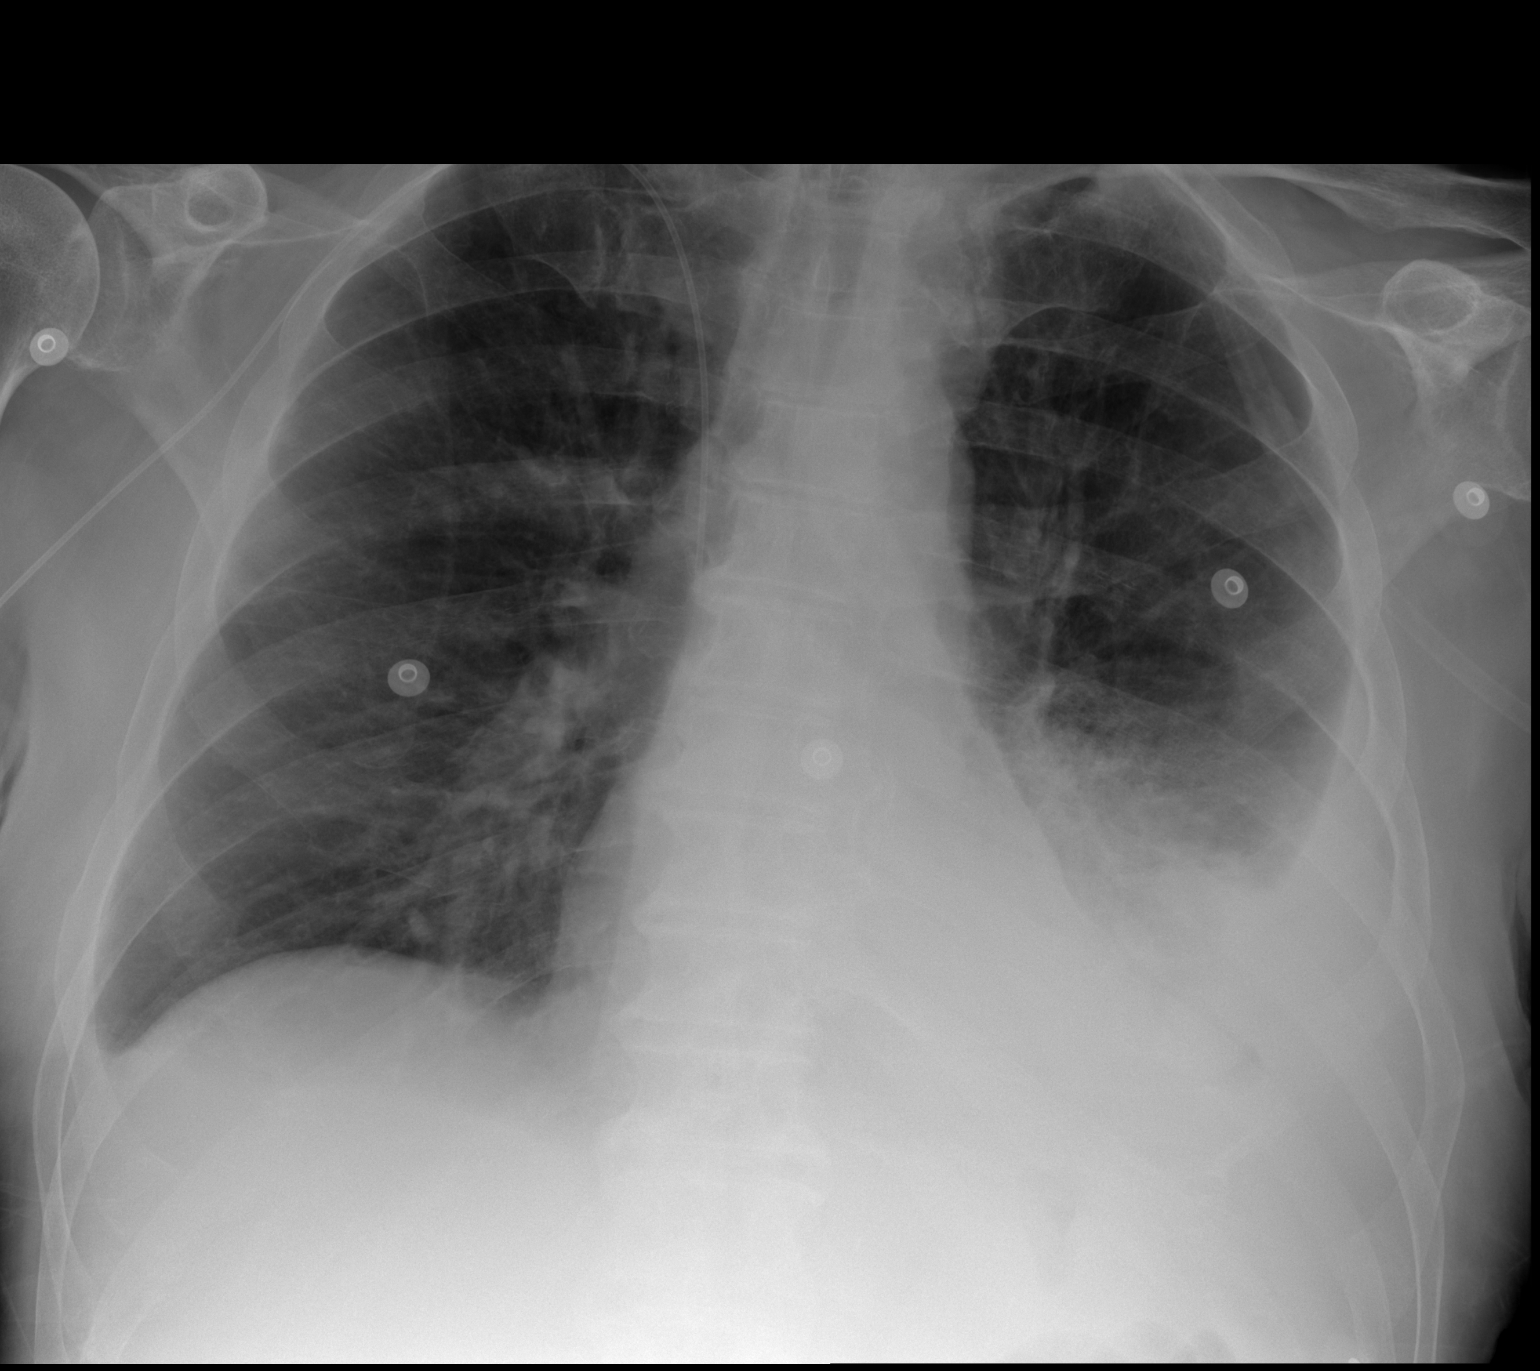

[2 of 2 positions shown; findings below may reference images not displayed]

FINDINGS: Right-sided PICC line tip terminates in the distal SVC. Heart size
is normal. No aortic aneurysm is noted. Slight interval increase in
moderate left pleural effusion. A small right pleural effusion is
now noted as well. There is pulmonary vascular congestion.
IMPRESSION: Slight interval increase in bilateral pleural effusions, left
greater than right. Persistent left basilar consolidation. Stable
PICC line position.

## 2018-04-27 IMAGING — CT CT ANGIO CHEST
2 of 7 series · 17 of 46 positions shown · IV contrast (ISOVUE)
Comparison: Radiograph 01/19/2018, CT chest 01/05/2018, 12/14/2017,
11/24/2017, 09/20/2017, PET CT 10/05/2016

CLINICAL DATA: Shortness of breath with left-sided chest pain
history of lung cancer

EXAM:
CT ANGIOGRAPHY CHEST WITH CONTRAST
TECHNIQUE: Multidetector CT imaging of the chest was performed using the
standard protocol during bolus administration of intravenous
contrast. Multiplanar CT image reconstructions and MIPs were
obtained to evaluate the vascular anatomy.
CONTRAST:  100mL [id number] IOPAMIDOL ([id number]) INJECTION 76%

[Series 5: thins · axial · 0.72mm/px · z∈[-280,+9]mm · 14 of 329 slices shown]
[im 20/329  lung]
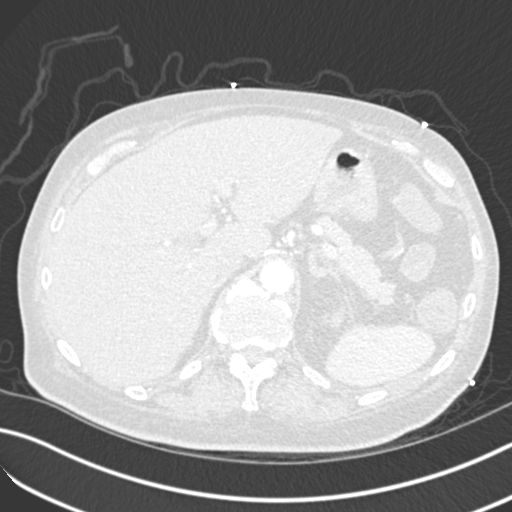
[im 39/329  soft-tissue]
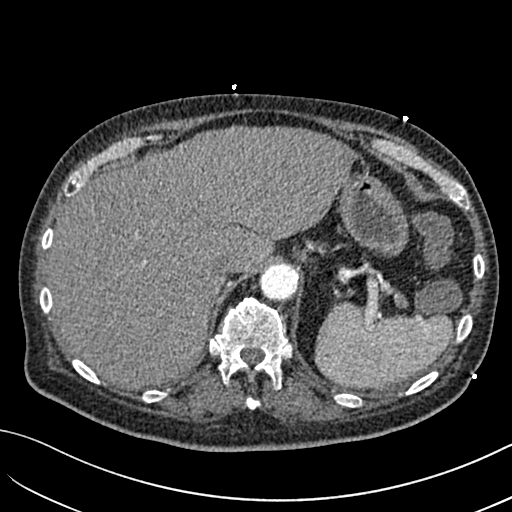
[im 58/329  lung]
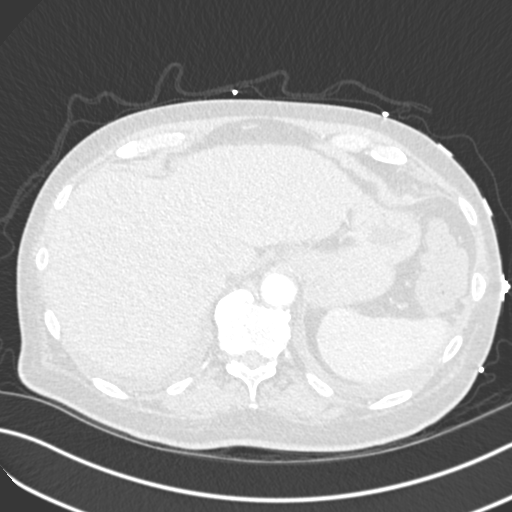
[im 97/329  soft-tissue]
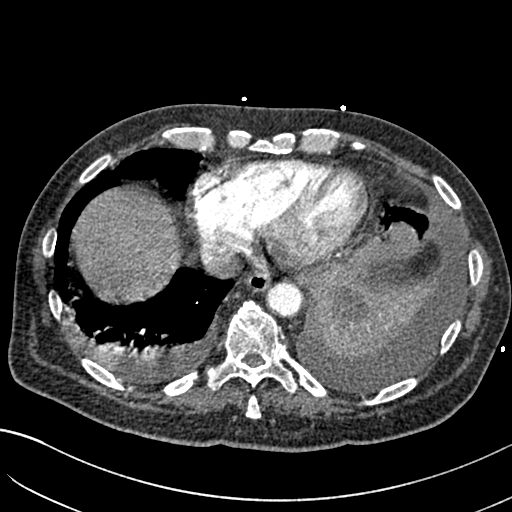
[im 116/329  lung]
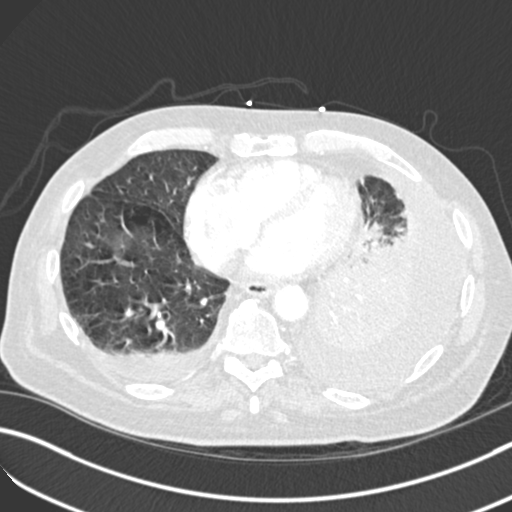
[im 136/329  soft-tissue]
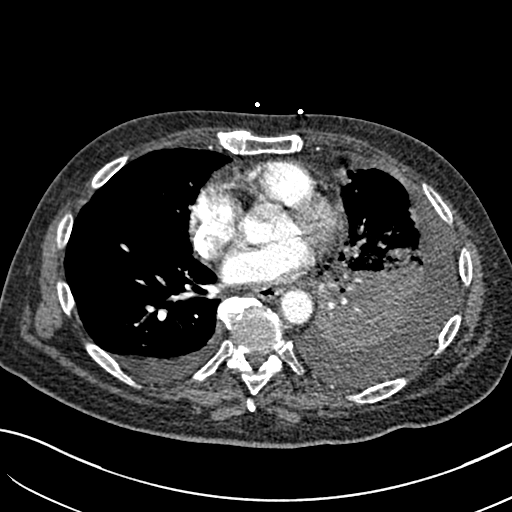
[im 155/329  lung]
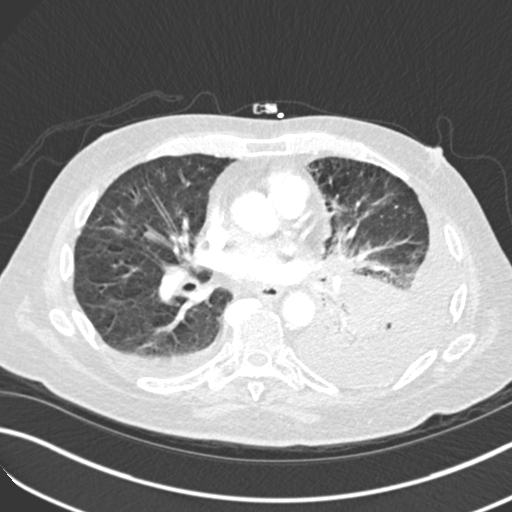
[im 174/329  soft-tissue]
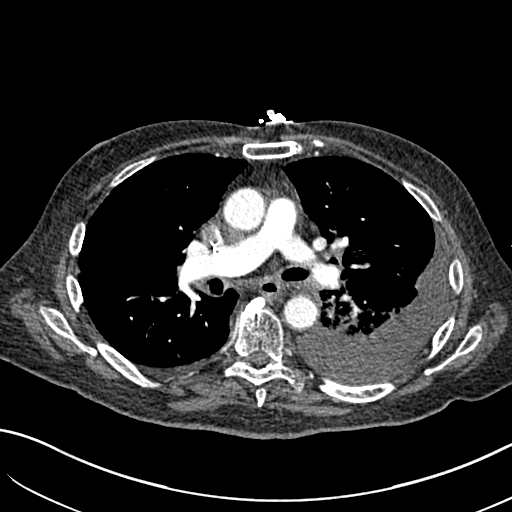
[im 193/329  lung]
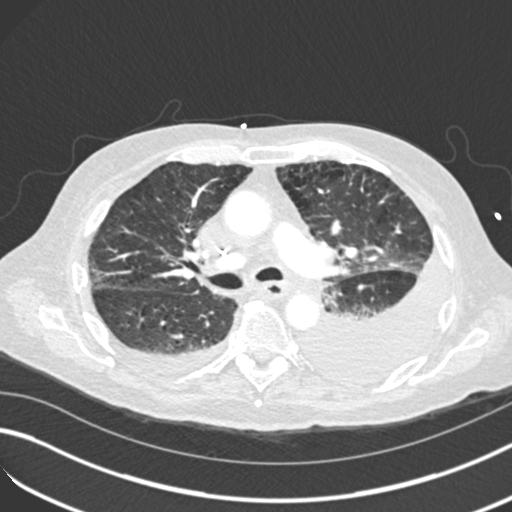
[im 213/329  soft-tissue]
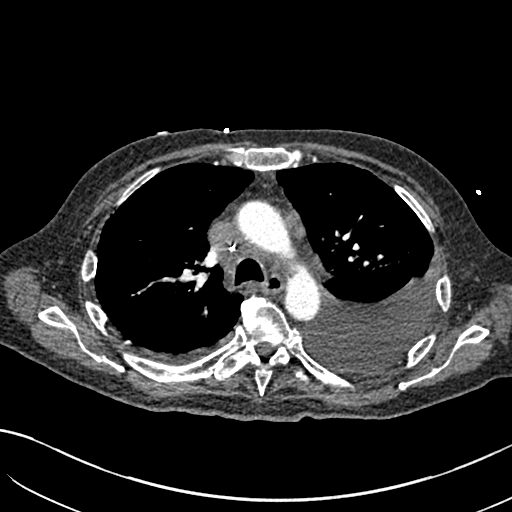
[im 251/329  lung]
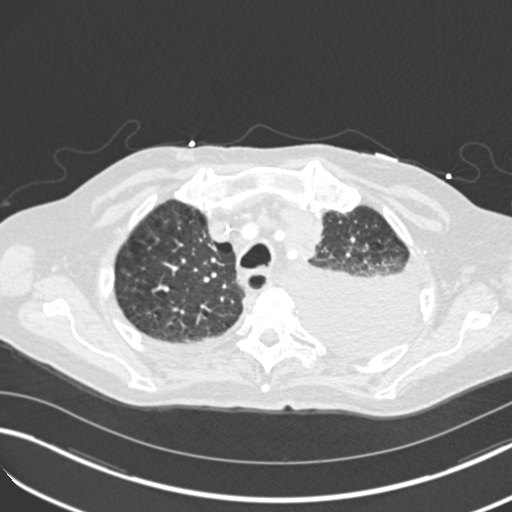
[im 271/329  soft-tissue]
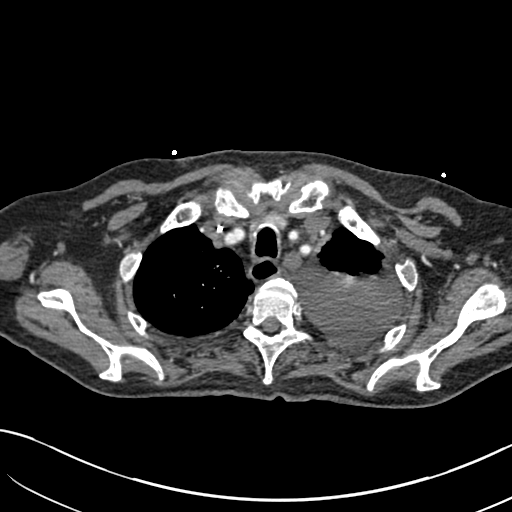
[im 290/329  lung]
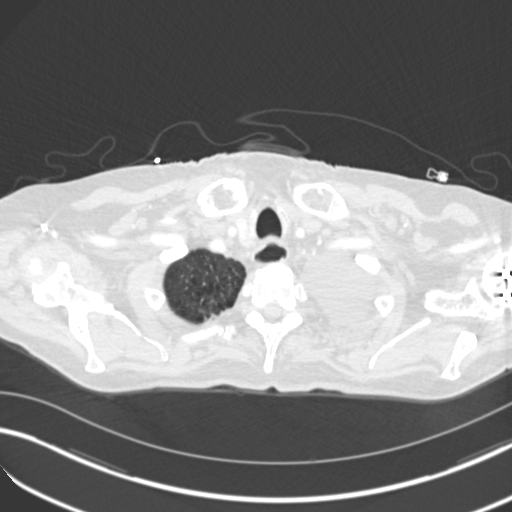
[im 309/329  soft-tissue]
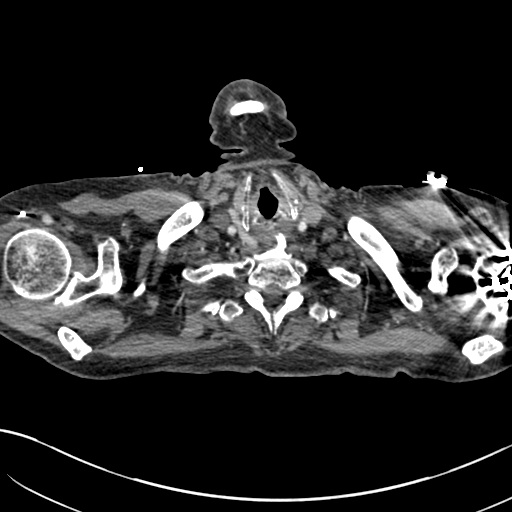

[Series 7: coronal mpr · coronal · 0.65mm/px · 3 of 139 slices shown]
[im 35/139  soft-tissue]
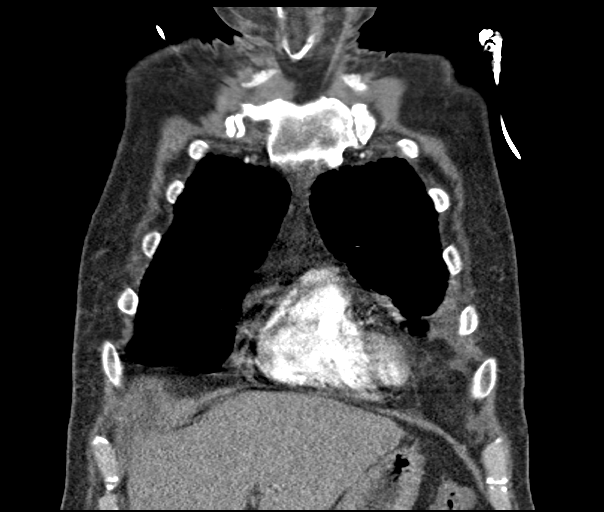
[im 70/139  soft-tissue]
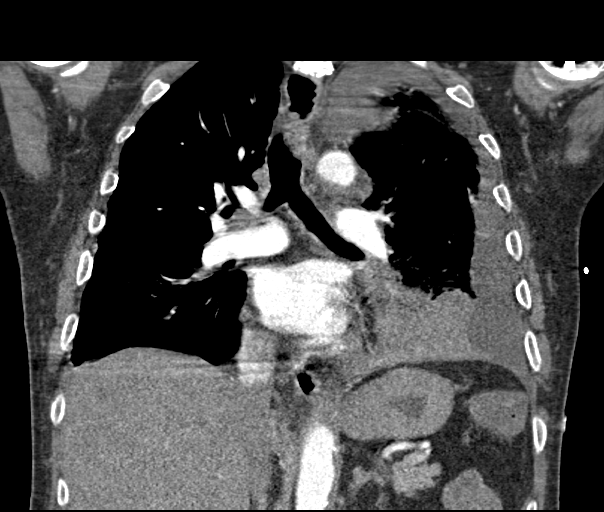
[im 104/139  soft-tissue]
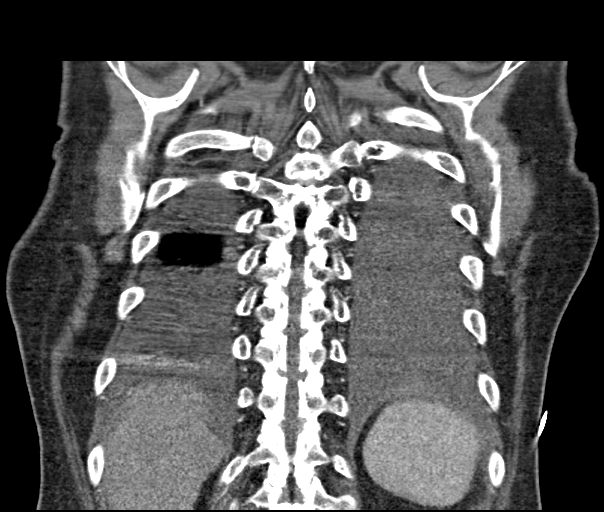

[17 of 46 positions shown; findings below may reference images not displayed]

FINDINGS: Cardiovascular: Satisfactory opacification of the pulmonary arteries
to the segmental level. No evidence of pulmonary embolism.
Nonaneurysmal aorta. Mild aortic atherosclerosis. Vascular catheter
tip in the distal SVC. Minimal coronary vascular calcification.
Normal heart size. No large pericardial effusion

Mediastinum/Nodes: Midline trachea. No thyroid mass. Probable left
lobectomy. Increased upper mediastinal adenopathy. A prevascular
lymph node measures 19 mm, compared with 17 mm previously. Lymph
node adjacent to the arch measures 11 mm compared with 9 mm
previously. Esophagus within normal limits.

Lungs/Pleura: Moderate emphysema. Small right pleural effusion, new.
Moderate to large left pleural effusion, increased. Left hilar
infiltrative soft tissue mass/density is stable to minimally
decreased. Left infrahilar soft tissue mass is contiguous with dense
consolidation in the left lower lobe. A central focal area of low
attenuation within the consolidated left lower lobe may represent
necrosis. Residual narrowing of left lower lobe bronchi by soft
tissue mass/consolidation though with some improvement proximally.
Some clearing of inflammatory infiltrates in the lingula, however
multifocal nodular densities or nodules are evident within the
lingula. A masslike density is present in the anterior left lung
base but is contiguous with left lower lobe consolidation.

Upper Abdomen: No acute abnormality.

Musculoskeletal: Degenerative changes of the spine. No acute or
suspicious bone lesion. Surgical hardware partially seen in the
proximal left humerus.

Review of the MIP images confirms the above findings.
IMPRESSION: 1. Negative for acute pulmonary embolus.
2. Moderate to large left pleural effusion, increased compared to
prior. Continued dense left lower lobe pneumonia with new central
low density in the consolidation possibly due to necrosis. Left
lower lobe pneumonia is contiguous with ill-defined, infiltrative
left hilar mass with narrowing of the left lower lobe bronchi.
Multiple nodules or nodular foci of disease visualized within the
lingula and anterior left lung base.
3. Increased mediastinal adenopathy since the most recent chest CT.
4. New right pleural effusion, small
5. Emphysema

Aortic Atherosclerosis ([id number].F) and Emphysema ([id number]).
